# Patient Record
Sex: Female | Born: 1955
Health system: Southern US, Community
[De-identification: ages and names within clinical notes are randomized; demographics above are authoritative.]

## PROBLEM LIST (undated history)

## (undated) DIAGNOSIS — I2510 Atherosclerotic heart disease of native coronary artery without angina pectoris: Secondary | ICD-10-CM

## (undated) DIAGNOSIS — C801 Malignant (primary) neoplasm, unspecified: Secondary | ICD-10-CM

## (undated) DIAGNOSIS — R011 Cardiac murmur, unspecified: Secondary | ICD-10-CM

## (undated) DIAGNOSIS — R0600 Dyspnea, unspecified: Secondary | ICD-10-CM

## (undated) DIAGNOSIS — I219 Acute myocardial infarction, unspecified: Secondary | ICD-10-CM

## (undated) DIAGNOSIS — I350 Nonrheumatic aortic (valve) stenosis: Secondary | ICD-10-CM

## (undated) DIAGNOSIS — E119 Type 2 diabetes mellitus without complications: Secondary | ICD-10-CM

## (undated) DIAGNOSIS — F329 Major depressive disorder, single episode, unspecified: Secondary | ICD-10-CM

## (undated) DIAGNOSIS — M1990 Unspecified osteoarthritis, unspecified site: Secondary | ICD-10-CM

## (undated) DIAGNOSIS — I209 Angina pectoris, unspecified: Secondary | ICD-10-CM

## (undated) DIAGNOSIS — E785 Hyperlipidemia, unspecified: Secondary | ICD-10-CM

## (undated) DIAGNOSIS — F32A Depression, unspecified: Secondary | ICD-10-CM

## (undated) HISTORY — PX: HYSTEROSCOPY DIAGNOSTIC: PRO49

## (undated) HISTORY — PX: JOINT REPLACEMENT: SHX530

---

## 1975-09-08 HISTORY — PX: NASAL SINUS SURGERY: SHX719

## 1994-09-07 HISTORY — PX: KNEE ARTHROSCOPY: SHX127

## 1999-05-20 ENCOUNTER — Other Ambulatory Visit: Admission: RE | Admit: 1999-05-20 | Discharge: 1999-05-20 | Payer: Self-pay | Admitting: Obstetrics & Gynecology

## 2000-07-12 ENCOUNTER — Encounter: Payer: Self-pay | Admitting: Obstetrics & Gynecology

## 2000-07-12 ENCOUNTER — Ambulatory Visit (HOSPITAL_COMMUNITY): Admission: RE | Admit: 2000-07-12 | Discharge: 2000-07-12 | Payer: Self-pay | Admitting: Obstetrics & Gynecology

## 2000-07-26 ENCOUNTER — Other Ambulatory Visit: Admission: RE | Admit: 2000-07-26 | Discharge: 2000-07-26 | Payer: Self-pay | Admitting: Obstetrics & Gynecology

## 2001-05-05 ENCOUNTER — Encounter: Payer: Self-pay | Admitting: Orthopedic Surgery

## 2001-05-05 ENCOUNTER — Ambulatory Visit (HOSPITAL_COMMUNITY): Admission: RE | Admit: 2001-05-05 | Discharge: 2001-05-05 | Payer: Self-pay | Admitting: Orthopedic Surgery

## 2001-08-28 ENCOUNTER — Emergency Department (HOSPITAL_COMMUNITY): Admission: EM | Admit: 2001-08-28 | Discharge: 2001-08-28 | Payer: Self-pay | Admitting: Emergency Medicine

## 2001-08-28 ENCOUNTER — Encounter: Payer: Self-pay | Admitting: Emergency Medicine

## 2002-04-12 ENCOUNTER — Other Ambulatory Visit: Admission: RE | Admit: 2002-04-12 | Discharge: 2002-04-12 | Payer: Self-pay | Admitting: Obstetrics & Gynecology

## 2003-02-14 ENCOUNTER — Ambulatory Visit (HOSPITAL_COMMUNITY): Admission: RE | Admit: 2003-02-14 | Discharge: 2003-02-14 | Payer: Self-pay | Admitting: Obstetrics & Gynecology

## 2003-02-14 ENCOUNTER — Encounter: Payer: Self-pay | Admitting: Obstetrics & Gynecology

## 2005-01-19 ENCOUNTER — Ambulatory Visit (HOSPITAL_COMMUNITY): Admission: RE | Admit: 2005-01-19 | Discharge: 2005-01-19 | Payer: Self-pay | Admitting: Gastroenterology

## 2006-07-14 ENCOUNTER — Ambulatory Visit (HOSPITAL_COMMUNITY): Admission: RE | Admit: 2006-07-14 | Discharge: 2006-07-14 | Payer: Self-pay | Admitting: Family Medicine

## 2009-04-09 ENCOUNTER — Ambulatory Visit (HOSPITAL_COMMUNITY): Admission: RE | Admit: 2009-04-09 | Discharge: 2009-04-09 | Payer: Self-pay | Admitting: Family Medicine

## 2011-01-23 NOTE — Op Note (Signed)
Sarah Stevenson, Sarah Stevenson              ACCOUNT NO.:  [id number]   MEDICAL RECORD NO.:  [id number]          PATIENT TYPE:  AMB   LOCATION:  ENDO                         FACILITY:  [facility]   PHYSICIAN:  Anselmo Rod, M.D.  DATE OF BIRTH:  23-Aug-1956   DATE OF PROCEDURE:  DATE OF DISCHARGE:                                 OPERATIVE REPORT   PROCEDURE PERFORMED:  Screening colonoscopy.   ENDOSCOPIST:  Anselmo Rod, M.D.   INSTRUMENT USED:  Olympus video colonoscope.   INDICATIONS FOR PROCEDURE:  A 55 year old white female with a history of  rectal bleeding and change in bowel habits undergoing screening colonoscopy  to rule out colonic polyps, masses, etc.   PREPROCEDURAL PREPARATION:  Informed consent was obtained from the patient.  The patient fasted for 8 hours prior to the procedure and prepped with a  bottle of magnesium citrate and a gallon of GoLYTELY the night prior to the  procedure.  The risks and benefits of the procedure, including a 10% miss  rate of cancer and polyps, were discussed with the patient as well.   PREPROCEDURAL PHYSICAL:  The patient had stable vital signs.  Neck was  supple.  Chest was clear to auscultation.  Heart:  S1 and S2, regular.  Abdomen:  Soft with normal bowel sounds.   DESCRIPTION OF PROCEDURE:  The patient was placed in the left lateral  decubitus position and sedated with 80 mg of Demerol and 10 mg of Versed in  slow incremental doses.  Once the patient was adequately sedated and  maintained on low flow oxygen and continuous cardiac monitoring, the Olympus  video colonoscope was advanced from the rectum to the cecum.  The  appendiceal orifice and cecal valve were clearly visualized and  photographed.  No masses, polyps, erosions, ulcerations, or diverticula were  seen.  The terminal ileum appeared healthy and without lesions.  Small  internal hemorrhoids were appreciated on retroflexion in the rectum.  The  patient tolerated the procedure  well and without complications.   IMPRESSION:  Normal colonoscopy up to the terminal ileum except for small,  nonbleeding internal hemorrhoids.  No masses, polyps, or diverticula seen.   RECOMMENDATIONS:  1. Continue a high-fiber diet with liberal fluid intake.  2. Repeat colonoscopy in the next 5 years unless the patient develops any      abdominal symptoms in the interim.  3. Outpatient followup as the need arises in the future.        JNM/MEDQ  D:  01/19/2005  T:  01/19/2005  Job:  [id number]   cc:   Gloriajean Dell. Andrey Campanile, M.D.  [address]  [place]  [place] [address]  Fax: [phone]

## 2012-02-17 ENCOUNTER — Encounter: Payer: Self-pay | Admitting: Family Medicine

## 2012-02-17 ENCOUNTER — Ambulatory Visit (INDEPENDENT_AMBULATORY_CARE_PROVIDER_SITE_OTHER): Payer: 59 | Admitting: Family Medicine

## 2012-02-17 VITALS — BP 109/74 | HR 76 | Temp 98.0°F | Ht 63.0 in | Wt 195.0 lb

## 2012-02-17 DIAGNOSIS — M654 Radial styloid tenosynovitis [de Quervain]: Secondary | ICD-10-CM

## 2012-02-17 NOTE — Patient Instructions (Addendum)
You have deQuervain's tenosynovitis, a degeneration and scar tissue formation within the tendons that go into your thumb. Avoid painful activities as much as possible. Continue your celebrex - take tylenol 500mg  1-2 tabs three times a day as needed for pain as well. Wear the thumb spica brace as often as possible to rest this. Ice 15 minutes at a time 3-4 times a day A cortisone injection typically helps a great deal with this and is an option if you're not improving over the next couple weeks. You can repeat up to 2 injections but if these still do not help, surgical referral is indicated. Let me know how things are going by a couple weeks from now.

## 2012-02-18 ENCOUNTER — Encounter: Payer: Self-pay | Admitting: Family Medicine

## 2012-02-18 DIAGNOSIS — M654 Radial styloid tenosynovitis [de Quervain]: Secondary | ICD-10-CM | POA: Insufficient documentation

## 2012-02-18 NOTE — Progress Notes (Signed)
  Subjective:    Patient ID: Sarah Stevenson, female    DOB: 1956-05-13, 56 y.o.   MRN: [id number]  PCP: Benedetto Goad  HPI 56 yo F here for left thumb pain.  Patient reports yesterday she was painting with right hand while holding paint with left hand. No known injury at the time. No swelling or bruising. Then this morning started noticing pain proximal left thumb worse with motions of thumb. Has been icing. Not tried any meds yet. No prior issues with this area.  Past Medical History  Diagnosis Date  . Diabetes mellitus     No current outpatient prescriptions on file prior to visit.    Past Surgical History  Procedure Date  . Knee surgery     left arthroscopy  . Nasal sinus surgery     Allergies  Allergen Reactions  . Blue Dyes (Parenteral)   . Nsaids     History   Social History  . Marital Status: Divorced    Spouse Name: N/A    Number of Children: N/A  . Years of Education: N/A   Occupational History  . Not on file.   Social History Main Topics  . Smoking status: Never Smoker   . Smokeless tobacco: Not on file  . Alcohol Use: Not on file  . Drug Use: Not on file  . Sexually Active: Not on file   Other Topics Concern  . Not on file   Social History Narrative  . No narrative on file    Family History  Problem Relation Age of Onset  . Heart attack Mother   . Heart attack Father   . Sudden death Father   . Hypertension Neg Hx   . Hyperlipidemia Neg Hx   . Diabetes Neg Hx     BP 109/74  Pulse 76  Temp 98 F (36.7 C) (Oral)  Ht 5\' 3"  (1.6 m)  Wt 195 lb (88.451 kg)  BMI 34.54 kg/m2  Review of Systems See HPI above.    Objective:   Physical Exam Gen: NAD  L hand: No gross deformity, swelling, bruising. TTP 1st dorsal compartment near base of thumb distally.  No TTP at [facility] joint of thumb, MCP, IP joints. FROM MCP, CMC, IP joints of thumb. Collateral ligaments intact of MCP, IP joints. Able to flex and extend against resistance at  MCP, IP joints. + finkelsteins. NVI distally.    Assessment & Plan:  1. Left dequervains tenosynovitis - will start with conservative treatment, very early in course of this.  Brace with thumb immobilization as much as possible, continue celebrex, add tylenol.  Icing as needed.  Handout provided.  Consider injection if not improving as expected.

## 2012-02-18 NOTE — Assessment & Plan Note (Signed)
Left dequervains tenosynovitis - will start with conservative treatment, very early in course of this.  Brace with thumb immobilization as much as possible, continue celebrex, add tylenol.  Icing as needed.  Handout provided.  Consider injection if not improving as expected.

## 2014-03-29 ENCOUNTER — Ambulatory Visit (INDEPENDENT_AMBULATORY_CARE_PROVIDER_SITE_OTHER): Payer: Self-pay

## 2014-03-29 ENCOUNTER — Other Ambulatory Visit: Payer: Self-pay | Admitting: Adult Health

## 2014-03-29 DIAGNOSIS — M11869 Other specified crystal arthropathies, unspecified knee: Secondary | ICD-10-CM

## 2014-03-29 DIAGNOSIS — M1710 Unilateral primary osteoarthritis, unspecified knee: Secondary | ICD-10-CM

## 2014-03-29 DIAGNOSIS — M25469 Effusion, unspecified knee: Secondary | ICD-10-CM

## 2014-03-29 DIAGNOSIS — R52 Pain, unspecified: Secondary | ICD-10-CM

## 2014-04-19 ENCOUNTER — Ambulatory Visit: Payer: 59 | Attending: Family Medicine | Admitting: Rehabilitation

## 2014-04-19 DIAGNOSIS — IMO0001 Reserved for inherently not codable concepts without codable children: Secondary | ICD-10-CM | POA: Diagnosis present

## 2014-04-19 DIAGNOSIS — M25559 Pain in unspecified hip: Secondary | ICD-10-CM | POA: Insufficient documentation

## 2014-04-19 DIAGNOSIS — M25569 Pain in unspecified knee: Secondary | ICD-10-CM | POA: Diagnosis not present

## 2014-08-03 ENCOUNTER — Emergency Department (HOSPITAL_BASED_OUTPATIENT_CLINIC_OR_DEPARTMENT_OTHER)
Admission: EM | Admit: 2014-08-03 | Discharge: 2014-08-03 | Disposition: A | Discharge status: Home / Self Care | Payer: 59 | Attending: Emergency Medicine | Admitting: Emergency Medicine

## 2014-08-03 ENCOUNTER — Encounter (HOSPITAL_BASED_OUTPATIENT_CLINIC_OR_DEPARTMENT_OTHER): Payer: Self-pay | Admitting: *Deleted

## 2014-08-03 DIAGNOSIS — Z79899 Other long term (current) drug therapy: Secondary | ICD-10-CM | POA: Insufficient documentation

## 2014-08-03 DIAGNOSIS — E119 Type 2 diabetes mellitus without complications: Secondary | ICD-10-CM | POA: Insufficient documentation

## 2014-08-03 DIAGNOSIS — K029 Dental caries, unspecified: Secondary | ICD-10-CM | POA: Diagnosis not present

## 2014-08-03 DIAGNOSIS — G500 Trigeminal neuralgia: Secondary | ICD-10-CM | POA: Insufficient documentation

## 2014-08-03 DIAGNOSIS — Z791 Long term (current) use of non-steroidal anti-inflammatories (NSAID): Secondary | ICD-10-CM | POA: Diagnosis not present

## 2014-08-03 DIAGNOSIS — K088 Other specified disorders of teeth and supporting structures: Secondary | ICD-10-CM | POA: Diagnosis present

## 2014-08-03 MED ORDER — MORPHINE SULFATE 2 MG/ML IJ SOLN
2.0000 mg | Freq: Once | INTRAMUSCULAR | Status: AC
  Administered 2014-08-03: 2 mg via INTRAMUSCULAR
  Filled 2014-08-03: qty 1

## 2014-08-03 MED ORDER — CLINDAMYCIN HCL 300 MG PO CAPS: 300.0000 mg | ORAL_CAPSULE | Freq: Four times a day (QID) | ORAL | Status: DC

## 2014-08-03 MED ORDER — DIAZEPAM 2 MG PO TABS
2.0000 mg | ORAL_TABLET | Freq: Once | ORAL | Status: AC
  Administered 2014-08-03: 2 mg via ORAL
  Filled 2014-08-03: qty 1

## 2014-08-03 MED ORDER — CLINDAMYCIN HCL 150 MG PO CAPS
300.0000 mg | ORAL_CAPSULE | Freq: Once | ORAL | Status: AC
  Administered 2014-08-03: 300 mg via ORAL
  Filled 2014-08-03: qty 2

## 2014-08-03 MED ORDER — CARBAMAZEPINE 200 MG PO TABS
200.0000 mg | ORAL_TABLET | Freq: Once | ORAL | Status: AC
  Administered 2014-08-03: 200 mg via ORAL
  Filled 2014-08-03: qty 1

## 2014-08-03 MED ORDER — CARBAMAZEPINE 200 MG PO TABS: 200.0000 mg | ORAL_TABLET | Freq: Two times a day (BID) | ORAL | Status: DC

## 2014-08-03 MED ORDER — OXYCODONE-ACETAMINOPHEN 5-325 MG PO TABS
2.0000 | ORAL_TABLET | Freq: Once | ORAL | Status: DC
  Filled 2014-08-03: qty 2

## 2014-08-03 MED ORDER — OXYCODONE-ACETAMINOPHEN 5-325 MG PO TABS: 1.0000 | ORAL_TABLET | Freq: Four times a day (QID) | ORAL | Status: DC | PRN

## 2014-08-03 MED ORDER — METHOCARBAMOL 500 MG PO TABS: 500.0000 mg | ORAL_TABLET | Freq: Two times a day (BID) | ORAL | Status: DC

## 2014-08-03 NOTE — ED Notes (Addendum)
C/o tooth sensitivity and pain that started on Wednesday but worse this morning. Denies any fevers. States her dentist was not able to see her due to an illness. C/o right side upper  Filling hurting and right facial pain that she describes as constant and sharp. Pt is allergic to ibuprofen but took some anyway and premedicated with benadryl and pepcid. States she did have hives for several hours after taking ibuprofen. Last took percocet around 11pm. 1300mg  of tylenol around 11pm as well.

## 2014-08-03 NOTE — Discharge Instructions (Signed)
Dental Pain  Toothache is pain in or around a tooth. It may get worse with chewing or with cold or heat.   HOME CARE  · Your dentist may use a numbing medicine during treatment. If so, you may need to avoid eating until the medicine wears off. Ask your dentist about this.  · Only take medicine as told by your dentist or doctor.  · Avoid chewing food near the painful tooth until after all treatment is done. Ask your dentist about this.  GET HELP RIGHT AWAY IF:   · The problem gets worse or new problems appear.  · You have a fever.  · There is redness and puffiness (swelling) of the face, jaw, or neck.  · You cannot open your mouth.  · There is pain in the jaw.  · There is very bad pain that is not helped by medicine.  MAKE SURE YOU:   · Understand these instructions.  · Will watch your condition.  · Will get help right away if you are not doing well or get worse.  Document Released: 02/10/2008 Document Revised: 11/16/2011 Document Reviewed: 02/10/2008  ExitCare® Patient Information ©2015 [employer]. This information is not intended to replace advice given to you by your health care provider. Make sure you discuss any questions you have with your health care provider.

## 2014-08-03 NOTE — ED Provider Notes (Signed)
CSN: [id number]     Arrival date & time 08/03/14  0217 History   First MD Initiated Contact with Patient 08/03/14 0231     Chief Complaint  Patient presents with  . Dental Pain     (Consider location/radiation/quality/duration/timing/severity/associated sxs/prior Treatment) Patient is a 58 y.o. female presenting with tooth pain. The history is provided by the patient. No language interpreter was used.  Dental Pain Location:  Upper Upper teeth location:  2/RU 2nd molar Quality:  Radiating (has spasmotic pain and sharp pain all the way to above the right eye) Severity:  Severe Onset quality:  Gradual Timing:  Constant Progression:  Worsening Chronicity:  New Context: cap still on, not dental fracture, not recent dental surgery and not trauma   Previous work-up:  Dental exam and filled cavity Relieved by:  Nothing Worsened by:  Nothing tried Ineffective treatments:  Acetaminophen and NSAIDs (percocet) Associated symptoms: facial pain   Associated symptoms: no congestion, no drooling, no facial swelling, no fever, no neck swelling, no oral bleeding and no trismus   Risk factors: diabetes   Was unable to see dentist today.  Has ground teeth in the past.  Feels spasm in muscles on right side but no click.    Past Medical History  Diagnosis Date  . Diabetes mellitus    Past Surgical History  Procedure Laterality Date  . Knee surgery      left arthroscopy  . Nasal sinus surgery     Family History  Problem Relation Age of Onset  . Heart attack Mother   . Heart attack Father   . Sudden death Father   . Hypertension Neg Hx   . Hyperlipidemia Neg Hx   . Diabetes Neg Hx    History  Substance Use Topics  . Smoking status: Former Research scientist (life sciences)  . Smokeless tobacco: Not on file  . Alcohol Use: No   OB History    No data available     Review of Systems  Constitutional: Negative for fever.  HENT: Negative for congestion, drooling, ear pain and facial swelling.   All other  systems reviewed and are negative.     Allergies  Blue dyes (parenteral) and Nsaids  Home Medications   Prior to Admission medications   Medication Sig Start Date End Date Taking? Authorizing Provider  Acetaminophen (TYLENOL ARTHRITIS PAIN PO) Take 650 mg by mouth.   Yes Historical Provider, MD  ALPRAZolam Duanne Moron) 0.5 MG tablet Take 0.5 mg by mouth at bedtime as needed for anxiety.   Yes Historical Provider, MD  atorvastatin (LIPITOR) 10 MG tablet Take 10 mg by mouth daily.   Yes Historical Provider, MD  celecoxib (CELEBREX) 200 MG capsule Take 200 mg by mouth 2 (two) times daily.   Yes Historical Provider, MD  cetirizine (ZYRTEC) 10 MG tablet Take 10 mg by mouth daily.   Yes Historical Provider, MD  glipiZIDE (GLUCOTROL) 5 MG tablet Take 5 mg by mouth daily before breakfast.   Yes Historical Provider, MD  losartan (COZAAR) 25 MG tablet Take 10 mg by mouth daily.   Yes Historical Provider, MD  METFORMIN HCL PO Take 1,500 mg by mouth.   Yes Historical Provider, MD  Multiple Vitamin (MULTIVITAMIN) capsule Take 1 capsule by mouth daily.   Yes Historical Provider, MD  Ranitidine HCl (ZANTAC PO) Take 150 mg by mouth.   Yes Historical Provider, MD  sitaGLIPtin (JANUVIA) 100 MG tablet Take 100 mg by mouth daily.   Yes Historical Provider, MD  carbamazepine (  TEGRETOL) 200 MG tablet Take 1 tablet (200 mg total) by mouth 2 (two) times daily. 08/03/14   Sheana Bir K Joh Rao-Rasch, MD  clindamycin (CLEOCIN) 300 MG capsule Take 1 capsule (300 mg total) by mouth 4 (four) times daily. X 7 days 08/03/14   Errica Dutil K Jeanclaude Wentworth-Rasch, MD  methocarbamol (ROBAXIN) 500 MG tablet Take 1 tablet (500 mg total) by mouth 2 (two) times daily. 08/03/14   Cresencia Asmus K Keaton Stirewalt-Rasch, MD  oxyCODONE-acetaminophen (PERCOCET) 5-325 MG per tablet Take 1-2 tablets by mouth every 6 (six) hours as needed. 08/03/14   Jya Hughston K Alzada Brazee-Rasch, MD   BP 188/97 mmHg  Pulse 94  Temp(Src) 97.5 F (36.4 C) (Oral)  Resp 18  SpO2 97% Physical Exam   Constitutional: She is oriented to person, place, and time. She appears well-developed and well-nourished.  HENT:  Head: Normocephalic and atraumatic.  Right Ear: External ear normal.  Left Ear: External ear normal.  Mouth/Throat: Oropharynx is clear and moist. No trismus in the jaw. No uvula swelling. No oropharyngeal exudate.    Eyes: Conjunctivae are normal. Pupils are equal, round, and reactive to light.  Neck: Normal range of motion. Neck supple.  Cardiovascular: Normal rate and regular rhythm.   Pulmonary/Chest: Effort normal and breath sounds normal. No respiratory distress. She has no wheezes. She has no rales.  Abdominal: Soft. Bowel sounds are normal. There is no tenderness.  Musculoskeletal: Normal range of motion.  Lymphadenopathy:    She has no cervical adenopathy.  Neurological: She is alert and oriented to person, place, and time.  Skin: Skin is warm and dry. She is not diaphoretic.    ED Course  Procedures (including critical care time) Labs Review Labs Reviewed - No data to display  Imaging Review No results found.   EKG Interpretation None      MDM   Final diagnoses:  Dental caries  Trigeminal neuralgia of right side of face   Medications  clindamycin (CLEOCIN) capsule 300 mg (300 mg Oral Given 08/03/14 0254)  morphine 2 MG/ML injection 2 mg (2 mg Intramuscular Given 08/03/14 0255)  diazepam (VALIUM) tablet 2 mg (2 mg Oral Given 08/03/14 0255)  carbamazepine (TEGRETOL) tablet 200 mg (200 mg Oral Given 08/03/14 0344)   While there is definitely a cavity in the right upper first molar, the pain is more consistent with trigeminal neuralgia.  Will treat for both caries and trigeminal neuralgia.  Follow up with dentistry or oral surgery    Manuel Dall K Quantina Dershem-Rasch, MD 08/03/14 [phone]

## 2014-08-03 NOTE — ED Notes (Signed)
Pt alert, NAD, calm, interactive, resps e/u, speaking in clear sentences, guarded movements and positioning of mouth, gauze with topical anesthetic held in mouth, visitor to BS.

## 2014-09-07 DIAGNOSIS — I219 Acute myocardial infarction, unspecified: Secondary | ICD-10-CM

## 2014-09-07 HISTORY — DX: Acute myocardial infarction, unspecified: I21.9

## 2014-11-30 ENCOUNTER — Emergency Department (HOSPITAL_BASED_OUTPATIENT_CLINIC_OR_DEPARTMENT_OTHER): Payer: 59

## 2014-11-30 ENCOUNTER — Encounter (HOSPITAL_COMMUNITY)
Admission: EM | Disposition: A | Discharge status: Home / Self Care | Payer: 59 | Source: Home / Self Care | Attending: Internal Medicine

## 2014-11-30 ENCOUNTER — Encounter (HOSPITAL_BASED_OUTPATIENT_CLINIC_OR_DEPARTMENT_OTHER): Payer: Self-pay

## 2014-11-30 ENCOUNTER — Inpatient Hospital Stay (HOSPITAL_BASED_OUTPATIENT_CLINIC_OR_DEPARTMENT_OTHER)
Admission: EM | Admit: 2014-11-30 | Discharge: 2014-12-01 | DRG: 247 | Disposition: A | Discharge status: Home / Self Care | Payer: 59 | Attending: Internal Medicine | Admitting: Internal Medicine

## 2014-11-30 DIAGNOSIS — M1990 Unspecified osteoarthritis, unspecified site: Secondary | ICD-10-CM | POA: Diagnosis present

## 2014-11-30 DIAGNOSIS — I200 Unstable angina: Secondary | ICD-10-CM | POA: Diagnosis not present

## 2014-11-30 DIAGNOSIS — I25110 Atherosclerotic heart disease of native coronary artery with unstable angina pectoris: Secondary | ICD-10-CM | POA: Diagnosis not present

## 2014-11-30 DIAGNOSIS — I214 Non-ST elevation (NSTEMI) myocardial infarction: Principal | ICD-10-CM | POA: Diagnosis present

## 2014-11-30 DIAGNOSIS — Z87891 Personal history of nicotine dependence: Secondary | ICD-10-CM

## 2014-11-30 DIAGNOSIS — I700 Atherosclerosis of aorta: Secondary | ICD-10-CM | POA: Diagnosis present

## 2014-11-30 DIAGNOSIS — E785 Hyperlipidemia, unspecified: Secondary | ICD-10-CM | POA: Diagnosis present

## 2014-11-30 DIAGNOSIS — E119 Type 2 diabetes mellitus without complications: Secondary | ICD-10-CM | POA: Diagnosis not present

## 2014-11-30 DIAGNOSIS — E669 Obesity, unspecified: Secondary | ICD-10-CM | POA: Diagnosis present

## 2014-11-30 DIAGNOSIS — R079 Chest pain, unspecified: Secondary | ICD-10-CM | POA: Diagnosis present

## 2014-11-30 DIAGNOSIS — R011 Cardiac murmur, unspecified: Secondary | ICD-10-CM | POA: Diagnosis present

## 2014-11-30 DIAGNOSIS — I350 Nonrheumatic aortic (valve) stenosis: Secondary | ICD-10-CM | POA: Diagnosis not present

## 2014-11-30 HISTORY — DX: Cardiac murmur, unspecified: R01.1

## 2014-11-30 HISTORY — DX: Unspecified osteoarthritis, unspecified site: M19.90

## 2014-11-30 HISTORY — PX: CARDIAC CATHETERIZATION: SHX172

## 2014-11-30 HISTORY — DX: Hyperlipidemia, unspecified: E78.5

## 2014-11-30 LAB — TROPONIN I
TROPONIN I: 0.05 ng/mL — AB (ref <0.031)
Troponin I: 0.04 ng/mL — ABNORMAL HIGH (ref <0.031)
Troponin I: 0.06 ng/mL — ABNORMAL HIGH (ref <0.031)
Troponin I: 0.08 ng/mL — ABNORMAL HIGH (ref <0.031)

## 2014-11-30 LAB — POCT I-STAT 3, VENOUS BLOOD GAS (G3P V)
Acid-base deficit: 4 mmol/L — ABNORMAL HIGH (ref 0.0–2.0)
Bicarbonate: 23.2 mEq/L (ref 20.0–24.0)
O2 SAT: 65 %
PCO2 VEN: 47.7 mmHg (ref 45.0–50.0)
PH VEN: 7.296 (ref 7.250–7.300)
TCO2: 25 mmol/L (ref 0–100)
pO2, Ven: 37 mmHg (ref 30.0–45.0)

## 2014-11-30 LAB — CBC
HCT: 38.9 % (ref 36.0–46.0)
HEMOGLOBIN: 13.2 g/dL (ref 12.0–15.0)
MCH: 31.3 pg (ref 26.0–34.0)
MCHC: 33.9 g/dL (ref 30.0–36.0)
MCV: 92.2 fL (ref 78.0–100.0)
Platelets: 225 10*3/uL (ref 150–400)
RBC: 4.22 MIL/uL (ref 3.87–5.11)
RDW: 12.2 % (ref 11.5–15.5)
WBC: 8.3 10*3/uL (ref 4.0–10.5)

## 2014-11-30 LAB — BASIC METABOLIC PANEL
ANION GAP: 11 (ref 5–15)
BUN: 12 mg/dL (ref 6–23)
CHLORIDE: 103 mmol/L (ref 96–112)
CO2: 24 mmol/L (ref 19–32)
Calcium: 9.2 mg/dL (ref 8.4–10.5)
Creatinine, Ser: 0.96 mg/dL (ref 0.50–1.10)
GFR calc Af Amer: 74 mL/min — ABNORMAL LOW (ref >90)
GFR calc non Af Amer: 64 mL/min — ABNORMAL LOW (ref >90)
Glucose, Bld: 247 mg/dL — ABNORMAL HIGH (ref 70–99)
Potassium: 4.3 mmol/L (ref 3.5–5.1)
SODIUM: 138 mmol/L (ref 135–145)

## 2014-11-30 LAB — GLUCOSE, CAPILLARY
Glucose-Capillary: 243 mg/dL — ABNORMAL HIGH (ref 70–99)
Glucose-Capillary: 155 mg/dL — ABNORMAL HIGH (ref 70–99)
Glucose-Capillary: 141 mg/dL — ABNORMAL HIGH (ref 70–99)

## 2014-11-30 LAB — POCT I-STAT 3, ART BLOOD GAS (G3+)
ACID-BASE DEFICIT: 4 mmol/L — AB (ref 0.0–2.0)
Bicarbonate: 22.1 mEq/L (ref 20.0–24.0)
O2 SAT: 93 %
PO2 ART: 72 mmHg — AB (ref 80.0–100.0)
TCO2: 23 mmol/L (ref 0–100)
pCO2 arterial: 41.7 mmHg (ref 35.0–45.0)
pH, Arterial: 7.332 — ABNORMAL LOW (ref 7.350–7.450)

## 2014-11-30 LAB — POCT ACTIVATED CLOTTING TIME: Activated Clotting Time: 503 seconds

## 2014-11-30 SURGERY — RIGHT/LEFT HEART CATH AND CORONARY ANGIOGRAPHY
Laterality: Right

## 2014-11-30 MED ORDER — NITROGLYCERIN 1 MG/10 ML FOR IR/CATH LAB
INTRA_ARTERIAL | Status: AC
  Filled 2014-11-30: qty 10

## 2014-11-30 MED ORDER — ONDANSETRON HCL 4 MG/2ML IJ SOLN
4.0000 mg | Freq: Once | INTRAMUSCULAR | Status: AC
  Administered 2014-11-30: 4 mg via INTRAVENOUS

## 2014-11-30 MED ORDER — DIPHENHYDRAMINE HCL 50 MG/ML IJ SOLN
12.5000 mg | Freq: Once | INTRAMUSCULAR | Status: AC
  Administered 2014-11-30: 12.5 mg via INTRAVENOUS
  Filled 2014-11-30: qty 1

## 2014-11-30 MED ORDER — TICAGRELOR 90 MG PO TABS
90.0000 mg | ORAL_TABLET | Freq: Two times a day (BID) | ORAL | Status: DC
  Administered 2014-12-01: 05:00:00 90 mg via ORAL
  Filled 2014-11-30 (×3): qty 1

## 2014-11-30 MED ORDER — MIDAZOLAM HCL 2 MG/2ML IJ SOLN
INTRAMUSCULAR | Status: AC
Start: 2014-11-30 — End: 2014-11-30
  Filled 2014-11-30: qty 2

## 2014-11-30 MED ORDER — LINAGLIPTIN 5 MG PO TABS
5.0000 mg | ORAL_TABLET | Freq: Every day | ORAL | Status: DC
  Filled 2014-11-30 (×2): qty 1

## 2014-11-30 MED ORDER — NITROGLYCERIN 0.4 MG SL SUBL
0.4000 mg | SUBLINGUAL_TABLET | SUBLINGUAL | Status: DC | PRN
  Administered 2014-11-30 (×3): 0.4 mg via SUBLINGUAL
  Filled 2014-11-30: qty 1

## 2014-11-30 MED ORDER — KRILL OIL 300 MG PO CAPS: 300.0000 mg | ORAL_CAPSULE | Freq: Every morning | ORAL | Status: DC

## 2014-11-30 MED ORDER — MULTIVITAMINS PO CAPS: 1.0000 | ORAL_CAPSULE | Freq: Every day | ORAL | Status: DC

## 2014-11-30 MED ORDER — GLIPIZIDE 10 MG PO TABS
10.0000 mg | ORAL_TABLET | Freq: Every day | ORAL | Status: DC
  Administered 2014-12-01: 10 mg via ORAL
  Filled 2014-11-30 (×2): qty 1

## 2014-11-30 MED ORDER — HEPARIN SODIUM (PORCINE) 1000 UNIT/ML IJ SOLN
INTRAMUSCULAR | Status: AC
Start: 2014-11-30 — End: 2014-11-30
  Filled 2014-11-30: qty 1

## 2014-11-30 MED ORDER — ASPIRIN 81 MG PO CHEW: 81.0000 mg | CHEWABLE_TABLET | ORAL | Status: DC

## 2014-11-30 MED ORDER — GI COCKTAIL ~~LOC~~
ORAL | Status: AC
  Filled 2014-11-30: qty 30

## 2014-11-30 MED ORDER — ACETAMINOPHEN 325 MG PO TABS
650.0000 mg | ORAL_TABLET | Freq: Three times a day (TID) | ORAL | Status: DC
  Filled 2014-11-30 (×3): qty 2

## 2014-11-30 MED ORDER — ONDANSETRON HCL 4 MG/2ML IJ SOLN
INTRAMUSCULAR | Status: AC
  Administered 2014-11-30: 4 mg via INTRAVENOUS
  Filled 2014-11-30: qty 2

## 2014-11-30 MED ORDER — LIDOCAINE HCL (PF) 1 % IJ SOLN
INTRAMUSCULAR | Status: AC
  Filled 2014-11-30: qty 30

## 2014-11-30 MED ORDER — ATORVASTATIN CALCIUM 80 MG PO TABS
80.0000 mg | ORAL_TABLET | Freq: Every day | ORAL | Status: DC
  Administered 2014-11-30: 80 mg via ORAL
  Filled 2014-11-30 (×2): qty 1

## 2014-11-30 MED ORDER — SODIUM CHLORIDE 0.9 % IV SOLN: INTRAVENOUS | Status: DC

## 2014-11-30 MED ORDER — SODIUM CHLORIDE 0.9 % IJ SOLN: 3.0000 mL | Freq: Two times a day (BID) | INTRAMUSCULAR | Status: DC

## 2014-11-30 MED ORDER — MIDAZOLAM HCL 2 MG/2ML IJ SOLN
INTRAMUSCULAR | Status: AC
  Filled 2014-11-30: qty 2

## 2014-11-30 MED ORDER — TICAGRELOR 90 MG PO TABS
ORAL_TABLET | ORAL | Status: AC
Start: 2014-11-30 — End: 2014-11-30
  Filled 2014-11-30: qty 1

## 2014-11-30 MED ORDER — LOSARTAN POTASSIUM 25 MG PO TABS
25.0000 mg | ORAL_TABLET | Freq: Every day | ORAL | Status: DC
  Administered 2014-12-01: 25 mg via ORAL
  Filled 2014-11-30 (×2): qty 1

## 2014-11-30 MED ORDER — ONDANSETRON HCL 4 MG/2ML IJ SOLN: 4.0000 mg | Freq: Four times a day (QID) | INTRAMUSCULAR | Status: DC | PRN

## 2014-11-30 MED ORDER — FAMOTIDINE 20 MG PO TABS
20.0000 mg | ORAL_TABLET | Freq: Every day | ORAL | Status: DC
  Administered 2014-11-30: 20 mg via ORAL
  Filled 2014-11-30 (×2): qty 1

## 2014-11-30 MED ORDER — NITROGLYCERIN IN D5W 200-5 MCG/ML-% IV SOLN
0.0000 ug/min | INTRAVENOUS | Status: DC
  Administered 2014-11-30: 10 ug/min via INTRAVENOUS
  Filled 2014-11-30: qty 250

## 2014-11-30 MED ORDER — PNEUMOCOCCAL VAC POLYVALENT 25 MCG/0.5ML IJ INJ
0.5000 mL | INJECTION | INTRAMUSCULAR | Status: AC
Start: 2014-12-01 — End: 2014-12-01
  Administered 2014-12-01: 09:00:00 0.5 mL via INTRAMUSCULAR
  Filled 2014-11-30: qty 0.5

## 2014-11-30 MED ORDER — ADULT MULTIVITAMIN W/MINERALS CH
1.0000 | ORAL_TABLET | Freq: Every day | ORAL | Status: DC
  Filled 2014-11-30: qty 1

## 2014-11-30 MED ORDER — OXYCODONE-ACETAMINOPHEN 5-325 MG PO TABS: 1.0000 | ORAL_TABLET | ORAL | Status: DC | PRN

## 2014-11-30 MED ORDER — SODIUM CHLORIDE 0.9 % IV SOLN: INTRAVENOUS | Status: AC

## 2014-11-30 MED ORDER — SODIUM CHLORIDE 0.9 % IV SOLN: 250.0000 mL | INTRAVENOUS | Status: DC | PRN

## 2014-11-30 MED ORDER — SODIUM CHLORIDE 0.9 % IV SOLN: Freq: Once | INTRAVENOUS | Status: DC

## 2014-11-30 MED ORDER — MORPHINE SULFATE 2 MG/ML IJ SOLN: 2.0000 mg | INTRAMUSCULAR | Status: DC | PRN

## 2014-11-30 MED ORDER — ACETAMINOPHEN 325 MG PO TABS: 650.0000 mg | ORAL_TABLET | ORAL | Status: DC | PRN

## 2014-11-30 MED ORDER — HEPARIN BOLUS VIA INFUSION
4000.0000 [IU] | Freq: Once | INTRAVENOUS | Status: AC
  Administered 2014-11-30: 4000 [IU] via INTRAVENOUS

## 2014-11-30 MED ORDER — ANGIOPLASTY BOOK
Freq: Once | Status: AC
  Administered 2014-11-30: 19:00:00
  Filled 2014-11-30: qty 1

## 2014-11-30 MED ORDER — MORPHINE SULFATE 4 MG/ML IJ SOLN
INTRAMUSCULAR | Status: AC
  Filled 2014-11-30: qty 1

## 2014-11-30 MED ORDER — FENTANYL CITRATE 0.05 MG/ML IJ SOLN
INTRAMUSCULAR | Status: AC
  Filled 2014-11-30: qty 2

## 2014-11-30 MED ORDER — HEPARIN (PORCINE) IN NACL 100-0.45 UNIT/ML-% IJ SOLN
1000.0000 [IU]/h | INTRAMUSCULAR | Status: DC
  Administered 2014-11-30: 1000 [IU]/h via INTRAVENOUS
  Filled 2014-11-30: qty 250

## 2014-11-30 MED ORDER — ASPIRIN 81 MG PO CHEW
81.0000 mg | CHEWABLE_TABLET | Freq: Every day | ORAL | Status: DC
  Filled 2014-11-30: qty 1

## 2014-11-30 MED ORDER — ASPIRIN 81 MG PO CHEW
324.0000 mg | CHEWABLE_TABLET | Freq: Once | ORAL | Status: AC
  Administered 2014-11-30: 324 mg via ORAL
  Filled 2014-11-30: qty 4

## 2014-11-30 MED ORDER — SODIUM CHLORIDE 0.9 % IJ SOLN: 3.0000 mL | INTRAMUSCULAR | Status: DC | PRN

## 2014-11-30 MED ORDER — MORPHINE SULFATE 4 MG/ML IJ SOLN
4.0000 mg | Freq: Once | INTRAMUSCULAR | Status: AC
  Administered 2014-11-30: 4 mg via INTRAVENOUS

## 2014-11-30 MED ORDER — TICAGRELOR 90 MG PO TABS
ORAL_TABLET | ORAL | Status: AC
  Filled 2014-11-30: qty 1

## 2014-11-30 MED ORDER — ALPRAZOLAM 0.5 MG PO TABS: 0.5000 mg | ORAL_TABLET | Freq: Every evening | ORAL | Status: DC | PRN

## 2014-11-30 MED ORDER — HEPARIN (PORCINE) IN NACL 2-0.9 UNIT/ML-% IJ SOLN
INTRAMUSCULAR | Status: AC
  Filled 2014-11-30: qty 1500

## 2014-11-30 MED ORDER — VERAPAMIL HCL 2.5 MG/ML IV SOLN
INTRAVENOUS | Status: AC
  Filled 2014-11-30: qty 2

## 2014-11-30 MED ORDER — LORATADINE 10 MG PO TABS
10.0000 mg | ORAL_TABLET | Freq: Every day | ORAL | Status: DC
  Filled 2014-11-30 (×2): qty 1

## 2014-11-30 MED ORDER — METFORMIN HCL 500 MG PO TABS
1500.0000 mg | ORAL_TABLET | Freq: Every day | ORAL | Status: DC
  Filled 2014-11-30 (×2): qty 3

## 2014-11-30 MED ORDER — GI COCKTAIL ~~LOC~~
30.0000 mL | Freq: Once | ORAL | Status: AC
  Administered 2014-11-30: 30 mL via ORAL

## 2014-11-30 MED ORDER — BIVALIRUDIN 250 MG IV SOLR
INTRAVENOUS | Status: AC
  Filled 2014-11-30: qty 250

## 2014-11-30 NOTE — ED Notes (Signed)
Gerealized urticarial rash. Pt states she has had a similar reaction to NSAIDS.

## 2014-11-30 NOTE — ED Notes (Signed)
EDP informed of pt status.

## 2014-11-30 NOTE — ED Notes (Signed)
Pt transported to Cath Lab via [facility] and stable upon transfer.  Report to be called to Cath Lab staff

## 2014-11-30 NOTE — Progress Notes (Signed)
TR BAND REMOVAL  LOCATION:    right radial  DEFLATED PER PROTOCOL:    Yes.    TIME BAND OFF / DRESSING APPLIED:    1915   SITE UPON ARRIVAL:    Level 0  SITE AFTER BAND REMOVAL:    Level 0  REVERSE ALLEN'S TEST:     positive  CIRCULATION SENSATION AND MOVEMENT:    Within Normal Limits   Yes.    COMMENTS:

## 2014-11-30 NOTE — Progress Notes (Signed)
PHARMACIST - PHYSICIAN ORDER COMMUNICATION  CONCERNING: P&T Medication Policy on Herbal Medications  DESCRIPTION:  This patient's order for:  Sarah Stevenson  has been noted.  This product(s) is classified as an "herbal" or natural product. Due to a lack of definitive safety studies or FDA approval, nonstandard manufacturing practices, plus the potential risk of unknown drug-drug interactions while on inpatient medications, the Pharmacy and Therapeutics Committee does not permit the use of "herbal" or natural products of this type within [facility].   ACTION TAKEN: The pharmacy department is unable to verify this order at this time and your patient has been informed of this safety policy. Please reevaluate patient's clinical condition at discharge and address if the herbal or natural product(s) should be resumed at that time.  Heide Guile, PharmD, BCPS Clinical Pharmacist Pager [phone]

## 2014-11-30 NOTE — Progress Notes (Signed)
Rt brachial venous sheath removed, 10 min pressure hold applied , dsd applied. Brachial pulse palpable, tolerated procedure well. Site level 0

## 2014-11-30 NOTE — ED Notes (Signed)
Report called to Cath lab.

## 2014-11-30 NOTE — ED Notes (Signed)
[facility] at bedside preparing to transport.

## 2014-11-30 NOTE — ED Notes (Signed)
Intermittent midsternal chest pressure that started 1 week ago associated with exertional SHOB and radiation to left arm and left shoulder/arm (aching).  Has been taking Fish Oil, Krill Oil, and Red Yeast Rice recently.  States if she could belch she would feel better.  Took a liquid antacid a few days ago with relief.

## 2014-11-30 NOTE — ED Provider Notes (Addendum)
CSN: [id number]     Arrival date & time 11/30/14  0741 History   First MD Initiated Contact with Patient 11/30/14 [phone]     Chief Complaint  Patient presents with  . Chest Pain     (Consider location/radiation/quality/duration/timing/severity/associated sxs/prior Treatment) HPI Comments: Patient with a history of diabetes and hyperlipidemia presents with chest discomfort. She reports a one-week history of intermittent indigestion symptoms. She's had some waxing and waning symptoms of discomfort to the center of her chest. It has relieved with antacids. She does have an exertional component to the symptoms however. She's had some occasional shortness of breath with the symptoms. At time she's had some radiation to her jaw and her left arm. She also had an episode yesterday with the symptoms and they became more severe and she got diaphoretic and had associated shortness of breath. This morning she states that he indigestion started about 5:00 in his been constant since that time. She does have some associated shortness of breath. She has some radiation to the left arm. She has a family history of early heart disease in that her mom in her dad both had coronary artery disease in their 85s and early 52s.  Patient is a 59 y.o. female presenting with chest pain.  Chest Pain Associated symptoms: diaphoresis, fatigue and shortness of breath   Associated symptoms: no abdominal pain, no cough, no dizziness, no fever, no headache, no nausea, no numbness, not vomiting and no weakness     Past Medical History  Diagnosis Date  . Diabetes mellitus   . Osteoarthritis   . Hyperlipidemia    Past Surgical History  Procedure Laterality Date  . Knee surgery      left arthroscopy  . Nasal sinus surgery     Family History  Problem Relation Age of Onset  . Heart attack Mother   . Heart attack Father   . Sudden death Father   . Hypertension Neg Hx   . Hyperlipidemia Neg Hx   . Diabetes Neg Hx     History  Substance Use Topics  . Smoking status: Former Research scientist (life sciences)  . Smokeless tobacco: Not on file  . Alcohol Use: No   OB History    No data available     Review of Systems  Constitutional: Positive for diaphoresis and fatigue. Negative for fever and chills.  HENT: Negative for congestion, rhinorrhea and sneezing.   Eyes: Negative.   Respiratory: Positive for shortness of breath. Negative for cough and chest tightness.   Cardiovascular: Positive for chest pain. Negative for leg swelling.  Gastrointestinal: Negative for nausea, vomiting, abdominal pain, diarrhea and blood in stool.  Genitourinary: Negative for frequency, hematuria, flank pain and difficulty urinating.  Musculoskeletal: Negative.   Skin: Negative for rash.  Neurological: Negative for dizziness, speech difficulty, weakness, numbness and headaches.      Allergies  Blue dyes (parenteral) and Nsaids  Home Medications   Prior to Admission medications   Medication Sig Start Date End Date Taking? Authorizing Provider  KRILL OIL PO Take by mouth.   Yes Historical Provider, MD  Acetaminophen (TYLENOL ARTHRITIS PAIN PO) Take 650 mg by mouth.    Historical Provider, MD  ALPRAZolam Duanne Moron) 0.5 MG tablet Take 0.5 mg by mouth at bedtime as needed for anxiety.    Historical Provider, MD  atorvastatin (LIPITOR) 10 MG tablet Take 40 mg by mouth daily.     Historical Provider, MD  celecoxib (CELEBREX) 200 MG capsule Take 200 mg by mouth daily.  Historical Provider, MD  cetirizine (ZYRTEC) 10 MG tablet Take 10 mg by mouth daily.    Historical Provider, MD  glipiZIDE (GLUCOTROL) 5 MG tablet Take 10 mg by mouth daily before breakfast.     Historical Provider, MD  losartan (COZAAR) 25 MG tablet Take 25 mg by mouth daily.     Historical Provider, MD  METFORMIN HCL PO Take 1,500 mg by mouth.    Historical Provider, MD  Multiple Vitamin (MULTIVITAMIN) capsule Take 1 capsule by mouth daily.    Historical Provider, MD  Ranitidine  HCl (ZANTAC PO) Take 150 mg by mouth.    Historical Provider, MD  sitaGLIPtin (JANUVIA) 100 MG tablet Take 100 mg by mouth daily.    Historical Provider, MD   BP 116/77 mmHg  Pulse 82  Temp(Src) 98.8 F (37.1 C) (Oral)  Resp 18  Ht 5\' 2"  (1.575 m)  Wt 195 lb (88.451 kg)  BMI 35.66 kg/m2  SpO2 97% Physical Exam  Constitutional: She is oriented to person, place, and time. She appears well-developed and well-nourished.  HENT:  Head: Normocephalic and atraumatic.  Eyes: Pupils are equal, round, and reactive to light.  Neck: Normal range of motion. Neck supple.  Cardiovascular: Normal rate, regular rhythm and normal heart sounds.   Pulmonary/Chest: Effort normal and breath sounds normal. No respiratory distress. She has no wheezes. She has no rales. She exhibits no tenderness.  Abdominal: Soft. Bowel sounds are normal. There is no tenderness. There is no rebound and no guarding.  Musculoskeletal: Normal range of motion. She exhibits no edema.  Lymphadenopathy:    She has no cervical adenopathy.  Neurological: She is alert and oriented to person, place, and time.  Skin: Skin is warm and dry. No rash noted.  Psychiatric: She has a normal mood and affect.    ED Course  Procedures (including critical care time) Labs Review Labs Reviewed  BASIC METABOLIC PANEL - Abnormal; Notable for the following:    Glucose, Bld 247 (*)    GFR calc non Af Amer 64 (*)    GFR calc Af Amer 74 (*)    All other components within normal limits  TROPONIN I - Abnormal; Notable for the following:    Troponin I 0.04 (*)    All other components within normal limits  CBC    Imaging Review Dg Chest Port 1 View  11/30/2014   CLINICAL DATA:  Central chest pressure with shortness of Breath  EXAM: PORTABLE CHEST - 1 VIEW  COMPARISON:  None.  FINDINGS: The heart size and mediastinal contours are within normal limits. Both lungs are clear. The visualized skeletal structures are unremarkable.  IMPRESSION: No active  disease.   Electronically Signed   By: Inez Catalina M.D.   On: 11/30/2014 08:06     EKG Interpretation   Date/Time:  Friday November 30 2014 08:10:22 EDT Ventricular Rate:  76 PR Interval:  134 QRS Duration: 90 QT Interval:  384 QTC Calculation: 432 R Axis:   18 Text Interpretation:  Normal sinus rhythm Normal ECG No old tracing to  compare Confirmed by Corley Kohls  MD, Kepler Mccabe ([id number]) on 11/30/2014 8:12:42 AM      MDM   Final diagnoses:  Unstable angina    Patient presents with some mid chest discomfort which she describes as indigestion and has some improvement with an acid type medicines but she also has an exertional component. She has a normal-appearing EKG but a mild bump in her troponin. Even here in the  ED her symptoms worsen with minimal exertion. She has an allergy to NSAIDs but she does take Celebrex.  She was given aspirin in the ED but did develop a rash. She was given a dose of Benadryl following this. She was started on heparin for unstable angina. I spoke with Dr. Haroldine Laws with the cardiology service who is accepted the patient for transfer and will arrange for her to have a cardiac catheterization later this afternoon.  Pt is currently pain free.    Malvin Johns, MD 11/30/14 7169  Malvin Johns, MD 11/30/14 [phone]

## 2014-11-30 NOTE — Progress Notes (Signed)
Pt in to for cath procedure. via stretcher.

## 2014-11-30 NOTE — H&P (Signed)
Sarah Stevenson is a 59 y.o. female  Admit Date: 11/30/2014 Referring Physician: [facility] Primary Cardiologist:: [facility] Blenda Bridegroom, M.D. 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 These episodes of pressure on both exertional and nonexertional. The longest episode lasted greater than 60 minutes this past weekend. She had another prolonged episode today while at work. She was evaluated by the emergency room staff, the ED physician discussed it with Dr. Jeffie Pollock, and she was sent to [facility] to have further evaluation which would include coronary angiography.  During the interview she was having recurring episodes of discomfort.  She tells me that her primary physician has identified a heart murmur on exam over the past 3 years. She has no history of valvular heart disease.  She was a prior smoker for greater than 30 years. No cigarettes for the past 2 years. She has been under a lot of stress with school and home problems.    PMH:    Past Medical History  Diagnosis Date  . Diabetes mellitus   . Osteoarthritis   . Hyperlipidemia     PSH:    Past Surgical History  Procedure Laterality Date  . Knee surgery      left arthroscopy  . Nasal sinus surgery     ALLERGIES:   Asa; Blue dyes (parenteral); and Nsaids Prior to Admit Meds:   (Not in a hospital admission) Family HX:    Family History  Problem Relation Age of Onset  . Heart attack Mother   . Heart attack Father   . Sudden death Father   . Hypertension Neg Hx   . Hyperlipidemia Neg Hx   . Diabetes Neg Hx    Social HX:    History   Social History  . Marital Status: Divorced    Spouse Name: N/A  . Number of Children: N/A  . Years of Education: N/A   Occupational History  . Not on  file.   Social History Main Topics  . Smoking status: Former Research scientist (life sciences)  . Smokeless tobacco: Not on file  . Alcohol Use: No  . Drug Use: No  . Sexual Activity: Not on file   Other Topics Concern  . Not on file   Social History Narrative     ROS: No history of myocardial infarction. Denies orthopnea. Is no peripheral edema. No history of advanced. No allergies.  Physical Exam: Blood pressure 126/68, pulse 110, temperature 98.8 F (37.1 C), temperature source Oral, resp. rate 18, height 5\' 2"  (1.575 m), weight 195 lb (88.451 kg), SpO2 97 %.     Somewhat pale. Obese, in no acute distress HEENT exam reveals no jaundice. Extraocular movements are full. Neck exam reveals bilateral carotid upstroke to be slightly diminished. Transmitted bruits are heard. No JVD is noted. Chest is clear to auscult aged and percussion Cardiac exam reveals 3/6 crescendo decrescendo murmur at the right upper sternal border. Abdomen is soft. Bowel sounds are normal. Extremities reveal no edema. Radial and dorsalis pedis pulses are 2+ Neurological exam is significant for anxiety  Labs: Lab Results  Component Value Date   WBC 8.3 11/30/2014   HGB 13.2 11/30/2014   HCT 38.9 11/30/2014   MCV 92.2 11/30/2014   PLT 225 11/30/2014     Recent Labs Lab 11/30/14 0800  NA 138  K 4.3  CL 103  CO2 24  BUN 12  CREATININE 0.96  CALCIUM 9.2  GLUCOSE 247*   Lab Results  Component Value Date   TROPONINI 0.05* 11/30/2014     Radiology:  No acute disease on chest x-ray  EKG:  Normal sinus rhythm/sinus tachycardia and overall normal  ASSESSMENT:  1. Unstable angina pectoris with mildly elevated troponin. 2. Cardiac murmur compatible with aortic stenosis of uncertain significance 3. Non-insulin-dependent diabetes mellitus 4. Hyperlipidemia  Plan:  1. Coronary angiography and right heart catheterization. The procedure and risks with a right arm approach were discussed in detail with the patient. Risk  of stroke, death, myocardial infarction, limb ischemia, contrast allergy, among others were discussed in detail except above the patient. Emergency surgery was also discussed as a possibility. 2. We discussed the possibility of coronary intervention 3. We discussed the possibility that she has significant aortic stenosis. 4. IV nitroglycerin was started during the interview. Sinclair Grooms 11/30/2014 12:17 PM

## 2014-11-30 NOTE — ED Notes (Signed)
Denies pain, however still feels like she needs to belch. Preparing for transport.

## 2014-11-30 NOTE — ED Notes (Signed)
MD at bedside. 

## 2014-11-30 NOTE — ED Notes (Signed)
Troponin #2 sent to lab as ordered.

## 2014-11-30 NOTE — CV Procedure (Addendum)
Left and Right Heart Catheterization with Coronary Angiography and PCI Report  Sarah Stevenson  59 y.o.  female 01-Feb-1956  Procedure Date: 11/30/2014 Referring Physician: [facility] Primary Cardiologist:: [facility] Blenda Bridegroom, M.D.  INDICATIONS: Unstable angina pectoris in clinical auscultatory evidence of aortic stenosis (previously undiagnosed)   PROCEDURE: 1. Left heart catheterization; 2. Coronary angiography; 3. Right heart catheterization; 4. DES RCA  CONSENT:  The risks, benefits, and details of the procedure were explained in detail to the patient. Risks including death, stroke, heart attack, kidney injury, allergy, limb ischemia, bleeding and radiation injury were discussed.  The patient verbalized understanding and wanted to proceed.  Informed written consent was obtained.  PROCEDURE TECHNIQUE:  After Xylocaine anesthesia a 5 French Slender sheath was placed in the right radial artery with an angiocath and the modified Seldinger technique. An Angiocath in the right antecubital vein, place prior to coming to the cath lab was used as access for performing right heart catheterization. This IV was exchanged over a micropuncture wire and a 5 French brachial sheath placed using the Seldinger technique. A 5 French balloon tip catheter was used for hemodynamic recordings. Coronary angiography was done using a 5 F JR4 and JL 3.5 cm diagnostic catheter. With moderate difficulty we were able to use a straight tipped 0.036 wire to cross the aortic valve. Left ventriculography was done using the JR 4 and hand injection. catheter and hand injection. We then carefully measured the pullback pressure across the aortic valve.  After reviewing all data in the context of the patient's presentation, we felt that she had unstable angina related to high-grade disease in the proximal to mid RCA. There was intermediate stenosis in the LAD. Aortic stenosis was judged to be moderate in severity. We  discussed the findings with the patient and family and decided to proceed with PCI of the RCA. The right heart catheter was removed.  We gave the patient a bolus followed by an infusion of bivalirudin and achieved an ACT that was therapeutic. She was loaded with Brilinta 190 mg orally. We used a 6 [place] JR4 guide catheter. We then used a Cougar 0.014 wire to cross the stenosis. Predilatation was performed with a 2.5 x 15 mm Emerge. We then positioned and deployed a 3.0 x 32 mm Promus Premier drug-eluting stent to 11 atm 2 inflations. Postdilatation was then performed with a 20 x 3.25 mm [place] Emerge. The balloon was expanded to 13 atm. 3 inflations were performed. Final result was felt to be sufficient. 0% stenosis was noted. An TIMI grade 3 flow was noted distally.  Hemostasis in the right radial was achieved with a wrist band using 13 cc of air. The brachial venous sheath will be pulled in 2 hours and hemostasis achieved with manual compression.   CONTRAST:  Total 200 cc cc.  COMPLICATIONS:  None   HEMODYNAMICS:  Aortic pressure 103/57 mmHg; LV pressure 130/-2 mmHg; LVEDP 4 mmHg; RA 1 mmHg; RV 23/1 mmHg; PA 19/2 mmHg; PCWP(mean) 2 mmHg ; Cardiac Output 5 L/m; AV gradient 27 mmHg peak to peak and 20.6 mm mean  ANGIOGRAPHIC DATA:    70 fluoroscopy demonstrates the aortic valve be heavily calcified  The left main coronary artery is normal.  The left anterior descending artery is large and wraps around the left ventricular apex. A large branching first diagonal arises proximally. After the origin of the diagonal the LAD contains segmental 50-70% narrowing.  The left circumflex artery is  moderate in size giving origin to one obtuse marginal branch that bifurcates on the mid lateral wall. Irregularities are noted in the proximal and mid vessel. No high-grade obstruction is seen..  The right coronary artery is this vessel is large. It supplies the large PDA and a bifurcating continuation branch. The  proximal to mid vessel contains segmental narrowing of at least 50% with a relatively focal region of greater than 90% obstruction. The mid vessel beyond the first acute marginal branch contains moderate diffuse disease but no high-grade obstruction. The distal vessel contains an eccentric 30% narrowing.   PCI RESULTS: Segmental 50-95% proximal to mid RCA is reduced to 0% with TIMI grade 3 flow after stenting as described above. No evidence of dissection or embolization is seen. Post stent final balloon diameter 3.25 mm.  LEFT VENTRICULOGRAM:  Left ventricular angiogram was done in the 30 RAO projection and revealed normal LV cavity size with mild to moderate inferior hypokinesis. EF 55%.   IMPRESSIONS:  1. Unstable angina pectoris due to high-grade obstruction in the proximal to mid RCA. A segmental 50 to 95% stenosis was reduced to 0% with DES as described above. 2. Mild to moderate mid LAD obstruction. Circumflex is widely patent. 3. Mild inferior wall hypokinesis. Overall normal LV function with EF 55% 4. Moderate calcific aortic stenosis   RECOMMENDATION:  1. 2-D Doppler echocardiogram to establish baseline 2. Continue IV bivalirudin for 30 minutes 3. Brilinta 90 mg by mouth twice a day 4. Probable discharge in a.m 5. High-dose statin therapy.

## 2014-12-01 ENCOUNTER — Encounter (HOSPITAL_COMMUNITY): Payer: Self-pay | Admitting: Physician Assistant

## 2014-12-01 DIAGNOSIS — Z955 Presence of coronary angioplasty implant and graft: Secondary | ICD-10-CM

## 2014-12-01 DIAGNOSIS — E118 Type 2 diabetes mellitus with unspecified complications: Secondary | ICD-10-CM | POA: Insufficient documentation

## 2014-12-01 DIAGNOSIS — I350 Nonrheumatic aortic (valve) stenosis: Secondary | ICD-10-CM

## 2014-12-01 DIAGNOSIS — R011 Cardiac murmur, unspecified: Secondary | ICD-10-CM | POA: Diagnosis present

## 2014-12-01 DIAGNOSIS — E119 Type 2 diabetes mellitus without complications: Secondary | ICD-10-CM

## 2014-12-01 DIAGNOSIS — I214 Non-ST elevation (NSTEMI) myocardial infarction: Principal | ICD-10-CM

## 2014-12-01 DIAGNOSIS — E785 Hyperlipidemia, unspecified: Secondary | ICD-10-CM | POA: Diagnosis present

## 2014-12-01 LAB — BASIC METABOLIC PANEL
Anion gap: 6 (ref 5–15)
BUN: 14 mg/dL (ref 6–23)
CO2: 27 mmol/L (ref 19–32)
CREATININE: 0.93 mg/dL (ref 0.50–1.10)
Calcium: 8.4 mg/dL (ref 8.4–10.5)
Chloride: 106 mmol/L (ref 96–112)
GFR calc Af Amer: 77 mL/min — ABNORMAL LOW (ref >90)
GFR calc non Af Amer: 66 mL/min — ABNORMAL LOW (ref >90)
GLUCOSE: 167 mg/dL — AB (ref 70–99)
Potassium: 3.8 mmol/L (ref 3.5–5.1)
SODIUM: 139 mmol/L (ref 135–145)

## 2014-12-01 LAB — LIPID PANEL
Cholesterol: 121 mg/dL (ref 0–200)
HDL: 25 mg/dL — AB (ref >39)
LDL CALC: 72 mg/dL (ref 0–99)
Total CHOL/HDL Ratio: 4.8 RATIO
Triglycerides: 121 mg/dL (ref <150)
VLDL: 24 mg/dL (ref 0–40)

## 2014-12-01 LAB — CBC
HEMATOCRIT: 37.2 % (ref 36.0–46.0)
HEMOGLOBIN: 12.5 g/dL (ref 12.0–15.0)
MCH: 31.3 pg (ref 26.0–34.0)
MCHC: 33.6 g/dL (ref 30.0–36.0)
MCV: 93 fL (ref 78.0–100.0)
Platelets: 204 10*3/uL (ref 150–400)
RBC: 4 MIL/uL (ref 3.87–5.11)
RDW: 12.8 % (ref 11.5–15.5)
WBC: 5.7 10*3/uL (ref 4.0–10.5)

## 2014-12-01 LAB — HEPATIC FUNCTION PANEL
ALBUMIN: 3.1 g/dL — AB (ref 3.5–5.2)
ALT: 21 U/L (ref 0–35)
AST: 20 U/L (ref 0–37)
Alkaline Phosphatase: 87 U/L (ref 39–117)
BILIRUBIN INDIRECT: 0.5 mg/dL (ref 0.3–0.9)
Bilirubin, Direct: 0.1 mg/dL (ref 0.0–0.5)
Total Bilirubin: 0.6 mg/dL (ref 0.3–1.2)
Total Protein: 6.3 g/dL (ref 6.0–8.3)

## 2014-12-01 LAB — TROPONIN I: Troponin I: 0.17 ng/mL — ABNORMAL HIGH (ref <0.031)

## 2014-12-01 LAB — GLUCOSE, CAPILLARY: GLUCOSE-CAPILLARY: 198 mg/dL — AB (ref 70–99)

## 2014-12-01 MED ORDER — TICAGRELOR 90 MG PO TABS: 90.0000 mg | ORAL_TABLET | Freq: Two times a day (BID) | ORAL | Status: DC

## 2014-12-01 MED ORDER — NITROGLYCERIN 0.4 MG SL SUBL: 0.4000 mg | SUBLINGUAL_TABLET | SUBLINGUAL | Status: DC | PRN

## 2014-12-01 MED ORDER — CARVEDILOL 3.125 MG PO TABS: 3.1250 mg | ORAL_TABLET | Freq: Two times a day (BID) | ORAL | Status: DC

## 2014-12-01 NOTE — Progress Notes (Signed)
Patient Name: Sarah Stevenson Date of Encounter: 12/01/2014  Principal Problem:   NSTEMI (non-ST elevated myocardial infarction) Active Problems:   Crescendo angina   Diabetes mellitus type 2, noninsulin dependent   Hyperlipidemia LDL goal <70   Murmur, cardiac   Primary Cardiologist: Dr. 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 Cath w/ PCI RCA.  SUBJECTIVE: No chest pain or SOB overnight  OBJECTIVE Filed Vitals:   11/30/14 2353 12/01/14 0000 12/01/14 0517 12/01/14 0717  BP: 124/61 129/59 126/46 149/88  Pulse: 79 81  76  Temp: 97.6 F (36.4 C)  97.6 F (36.4 C) 97.8 F (36.6 C)  TempSrc: Oral  Oral Oral  Resp: 16 17 26 17   Height:      Weight: 197 lb 5 oz (89.5 kg)     SpO2: 98% 98% 97% 97%    Intake/Output Summary (Last 24 hours) at 12/01/14 0810 Last data filed at 12/01/14 0745  Gross per 24 hour  Intake    760 ml  Output   1650 ml  Net   -890 ml   Filed Weights   11/30/14 0742 11/30/14 1507 11/30/14 2353  Weight: 195 lb (88.451 kg) 197 lb 5 oz (89.5 kg) 197 lb 5 oz (89.5 kg)    PHYSICAL EXAM General: Well developed, well nourished, female in no acute distress. Head: Normocephalic, atraumatic.  Neck: Supple without bruits, JVD not elevated. Lungs:  Resp regular and unlabored, CTA. Heart: RRR, S1, diminished S2, no S3, S4, late-peaking systolic murmur best heard over RUSB; no rub. Abdomen: Soft, non-tender, non-distended, BS + x 4.  Extremities: No clubbing, cyanosis, no edema. Right radial cath site without ecchymosis or hematoma Neuro: Alert and oriented X 3. Moves all extremities spontaneously. Psych: Normal affect.  LABS: CBC:  Recent Labs  11/30/14 0800 12/01/14 0425  WBC 8.3 5.7  HGB 13.2 12.5  HCT 38.9 37.2  MCV 92.2 93.0  PLT 225 400   Basic Metabolic Panel:  Recent Labs  11/30/14 0800 12/01/14 0425  NA 138 139  K 4.3 3.8  CL 103 106  CO2 24 27  GLUCOSE 247* 167*  BUN  12 14  CREATININE 0.96 0.93  CALCIUM 9.2 8.4   Liver Function Tests:  Recent Labs  12/01/14 0425  AST 20  ALT 21  ALKPHOS 87  BILITOT 0.6  PROT 6.3  ALBUMIN 3.1*   Cardiac Enzymes:  Recent Labs  11/30/14 1658 11/30/14 2038 12/01/14 0425  TROPONINI 0.06* 0.08* 0.17*   Fasting Lipid Panel:  Recent Labs  12/01/14 0425  CHOL 121  HDL 25*  LDLCALC 72  TRIG 121  CHOLHDL 4.8    TELE: SR, ST, no sig ectopy     Radiology/Studies: Dg Chest Port 1 View 11/30/2014   CLINICAL DATA:  Central chest pressure with shortness of Breath  EXAM: PORTABLE CHEST - 1 VIEW  COMPARISON:  None.  FINDINGS: The heart size and mediastinal contours are within normal limits. Both lungs are clear. The visualized skeletal structures are unremarkable.  IMPRESSION: No active disease.   Electronically Signed   By: Inez Catalina M.D.   On: 11/30/2014 08:06     Current Medications:  . acetaminophen  650 mg Oral 3 times per day  . aspirin  81 mg Oral Daily  . atorvastatin  80 mg Oral q1800  . famotidine  20 mg Oral Daily  . glipiZIDE  10 mg Oral  QAC breakfast  . linagliptin  5 mg Oral Daily  . loratadine  10 mg Oral Daily  . losartan  25 mg Oral Daily  . metFORMIN  1,500 mg Oral Q breakfast  . multivitamin with minerals  1 tablet Oral Daily  . pneumococcal 23 valent vaccine  0.5 mL Intramuscular Tomorrow-1000  . ticagrelor  90 mg Oral BID    Cath:  IMPRESSIONS: 1. Unstable angina pectoris due to high-grade obstruction in the proximal to mid RCA. A segmental of to 95% stenosis was reduced to 0% with DES as described above. 2. Mild to moderate mid LAD obstruction. Circumflex is widely patent. 3. Mild inferior wall hypokinesis. Overall normal LV function with EF 55% 4. Moderate calcific aortic stenosis RECOMMENDATION: 1. 2-D Doppler echocardiogram to establish baseline 2. Continue IV bivalirudin for 30 minutes 3. Brilinta 90 mg by mouth twice a day 4. Probable discharge in a.m 5. High-dose  statin therapy.  ASSESSMENT AND PLAN: Principal Problem:   NSTEMI (non-ST elevated myocardial infarction) - cath results above, s/p DES RCA - on ASA, statin, ARB  Active Problems:   Diabetes mellitus type 2, noninsulin dependent - hold metformin - continue other meds    Hyperlipidemia LDL goal <70 - started on high-dose Lipitor  Plan - cardiac rehab seeing, d/c today and f/u as OP.  Signed, Rosaria Ferries , PA-C 8:10 AM 12/01/2014  The patient was seen and examined, and I agree with the assessment and plan as documented above, with modifications as noted below. 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 No bleeding problems. Found to have moderate aortic stenosis by cath. About to have echocardiogram. S2 is diminished and RUSB murmur is III/VI and late-peaking, suggestive of more severe aortic valve disease. Will discharge to home after echocardiogram. Should participate in cardiac rehabilitation afterwards.

## 2014-12-01 NOTE — Discharge Summary (Signed)
CARDIOLOGY DISCHARGE SUMMARY   Patient ID: GISSELL BARRA MRN: [id number] DOB/AGE: 1956-03-18 59 y.o.  Admit date: 11/30/2014 Discharge date: 12/01/2014  PCP: Woody Seller, MD Primary Cardiologist: Dr. Tamala Julian  Primary Discharge Diagnosis:  Non-STEMI - s/p   RCA Secondary Discharge Diagnosis:    Hyperlipidemia LDL goal <70   Murmur, cardiac   Diabetes, type II, non-insulin-dependent  Procedures: 1. Left heart catheterization; 2. Coronary angiography; 3. Right heart catheterization; 4. DES RCA, 5. 2-D echocardiogram  Hospital Course: SOPHRONIA VARNEY is a 59 y.o. female with no history of CAD. She had been having occasional episodes of exertional burning, that were increasing in frequency and intensity. She was evaluated at [employer] [place] where she works, and her initial troponin was mildly elevated. She was transferred to [facility] for further evaluation and treatment.  She was taken to the cath lab on 11/30/2014, results are below. She had nonobstructive disease in the LAD but her RCA had greater than 90% stenosis. This was treated with PTCA and a drug-eluting stent, which reduced the stenosis to 0. Her EF was normal. She tolerated the procedure well.  She was noted to have a significant heart murmur and a 2-D echo was obtained to evaluate for aortic stenosis which was moderate at cath. Results are pending at the time of dictation.  On 12/01/2014, she was seen by Dr. Bronson Ing and all data were reviewed. Her cath site was without ecchymosis or hematoma. She was seen by cardiac rehabilitation and educated on MI restrictions, heart-healthy lifestyle modifications and exercise guidelines. Her metformin is on hold for 48 hours but she is okay to continue her other diabetes medications. She was started on high-dose statin and a beta blocker. She was not on aspirin, due to allergy, but is tolerating the Brilinta well. She was ambulating without chest pain or shortness of  breath. No further inpatient workup is indicated, and she is considered stable for discharge, to follow up as an outpatient.  Labs:   Lab Results  Component Value Date   WBC 5.7 12/01/2014   HGB 12.5 12/01/2014   HCT 37.2 12/01/2014   MCV 93.0 12/01/2014   PLT 204 12/01/2014    Recent Labs Lab 12/01/14 0425  NA 139  K 3.8  CL 106  CO2 27  BUN 14  CREATININE 0.93  CALCIUM 8.4  PROT 6.3  BILITOT 0.6  ALKPHOS 87  ALT 21  AST 20  GLUCOSE 167*    Recent Labs  11/30/14 1658 11/30/14 2038 12/01/14 0425  TROPONINI 0.06* 0.08* 0.17*   Lipid Panel     Component Value Date/Time   CHOL 121 12/01/2014 0425   TRIG 121 12/01/2014 0425   HDL 25* 12/01/2014 0425   CHOLHDL 4.8 12/01/2014 0425   VLDL 24 12/01/2014 0425   LDLCALC 72 12/01/2014 0425      Radiology: Dg Chest Port 1 View 11/30/2014   CLINICAL DATA:  Central chest pressure with shortness of Breath  EXAM: PORTABLE CHEST - 1 VIEW  COMPARISON:  None.  FINDINGS: The heart size and mediastinal contours are within normal limits. Both lungs are clear. The visualized skeletal structures are unremarkable.  IMPRESSION: No active disease.   Electronically Signed   By: Inez Catalina M.D.   On: 11/30/2014 08:06   Cardiac Cath: 11/30/2014 ANGIOGRAPHIC DATA: 70 fluoroscopy demonstrates the aortic valve be heavily calcified The left main coronary artery is normal. The left anterior descending artery is large and wraps around  the left ventricular apex. A large branching first diagonal arises proximally. After the origin of the diagonal the LAD contains segmental 50-70% narrowing. The left circumflex artery is moderate in size giving origin to one obtuse marginal branch that bifurcates on the mid lateral wall. Irregularities are noted in the proximal and mid vessel. No high-grade obstruction is seen.. The right coronary artery is this vessel is large. It supplies the large PDA and a bifurcating continuation branch. The proximal to mid  vessel contains segmental narrowing of at least 50% with a relatively focal region of greater than 90% obstruction. The mid vessel beyond the first acute marginal branch contains moderate diffuse disease but no high-grade obstruction. The distal vessel contains an eccentric 30% narrowing. PCI RESULTS: Segmental 50-95% proximal to mid RCA is reduced to 0% with TIMI grade 3 flow after stenting as described above. No evidence of dissection or embolization is seen. Post stent final balloon diameter 3.25 mm. LEFT VENTRICULOGRAM: Left ventricular angiogram was done in the 30 RAO projection and revealed normal LV cavity size with mild to moderate inferior hypokinesis. EF 55%. IMPRESSIONS: 1. Unstable angina pectoris due to high-grade obstruction in the proximal to mid RCA. A segmental of to 95% stenosis was reduced to 0% with DES as described above. 2. Mild to moderate mid LAD obstruction. Circumflex is widely patent. 3. Mild inferior wall hypokinesis. Overall normal LV function with EF 55% 4. Moderate calcific aortic stenosis RECOMMENDATION: 1. 2-D Doppler echocardiogram to establish baseline 2. Continue IV bivalirudin for 30 minutes 3. Brilinta 90 mg by mouth twice a day 4. Probable discharge in a.m 5. High-dose statin therapy.  EKG: 11/30/2014, post-cath Sinus rhythm, PACs Heart rate 78, no acute ischemic changes  Echo: 12/01/2014 Results pending  FOLLOW UP PLANS AND APPOINTMENTS Allergies  Allergen Reactions  . Asa [Aspirin] Rash  . Blue Dyes (Parenteral)     rash  . Corn-Containing Products     hives  . Nsaids     rash  . Sugar-Protein-Starch     hives  . Tea     hives     Medication List    ASK your doctor about these medications        ALPRAZolam 0.5 MG tablet  Commonly known as:  XANAX  Take 0.5 mg by mouth at bedtime as needed for anxiety.     cetirizine 10 MG tablet  Commonly known as:  ZYRTEC  Take 10 mg by mouth daily.     fluticasone 50 MCG/ACT nasal spray    Commonly known as:  FLONASE  Place 1 spray into both nostrils daily. For allergies     glipiZIDE 5 MG tablet  Commonly known as:  GLUCOTROL  Take 10 mg by mouth daily before breakfast.     KRILL OIL PO  Take 1 tablet by mouth at bedtime and may repeat dose one time if needed.     losartan 25 MG tablet  Commonly known as:  COZAAR  Take 25 mg by mouth daily.     METFORMIN HCL PO  Take 1,500 mg by mouth daily.     multivitamin capsule  Take 1 capsule by mouth daily.     sitaGLIPtin 100 MG tablet  Commonly known as:  JANUVIA  Take 100 mg by mouth daily.     TYLENOL ARTHRITIS PAIN PO  Take 650 mg by mouth daily as needed. For rash     ZANTAC PO  Take 150 mg by mouth.  Discharge Instructions    Amb Referral to Cardiac Rehabilitation    Complete by:  As directed             BRING ALL MEDICATIONS WITH YOU TO FOLLOW UP APPOINTMENTS  Time spent with patient to include physician time: 41 min Signed: Rosaria Ferries, PA-C 12/01/2014, 8:38 AM Co-Sign MD

## 2014-12-01 NOTE — Progress Notes (Signed)
  Echocardiogram 2D Echocardiogram has been performed.  Sarah Stevenson 12/01/2014, 9:14 AM

## 2014-12-01 NOTE — Progress Notes (Signed)
CARDIAC REHAB PHASE I   PRE:  Rate/Rhythm: 93 SR  BP:  Sitting: 142/64     SaO2: 92% RA  MODE:  Ambulation: 1000 ft   POST:  Rate/Rhythm: 100 ST  BP:  Sitting: 168/61     SaO2: 96% RA  7:49 AM-8:36am Patient tolerated walk well.  Walked independently and conversed entire time.  Patient stated that she was a little short of breath while walking and talking.  No pain or dizziness.  Patient educated on Cardiac Rehab, nutrition, risk factors, restrictions, and exercise.  Patient states that she is interested in cardiac rehab but may have some problems attending due to her work schedule. Patient left in room in chair.   Miche Loughridge, [place], [place] 12/01/2014 8:31 AM

## 2014-12-01 NOTE — Discharge Instructions (Signed)
PLEASE REMEMBER TO BRING ALL OF YOUR MEDICATIONS TO EACH OF YOUR FOLLOW-UP OFFICE VISITS. ° °PLEASE ATTEND ALL SCHEDULED FOLLOW-UP APPOINTMENTS.  ° °Activity: Increase activity slowly as tolerated. You may shower, but no soaking baths (or swimming) for 1 week. No driving for 2 days. No lifting over 5 lbs for 1 week. No sexual activity for 1 week.  ° °You May Return to Work: in 1 week (if applicable) ° °Wound Care: You may wash cath site gently with soap and water. Keep cath site clean and dry. If you notice pain, swelling, bleeding or pus at your cath site, please call [phone]. ° ° ° °Cardiac Cath Site Care °Refer to this sheet in the next few weeks. These instructions provide you with information on caring for yourself after your procedure. Your caregiver may also give you more specific instructions. Your treatment has been planned according to current medical practices, but problems sometimes occur. Call your caregiver if you have any problems or questions after your procedure. °HOME CARE INSTRUCTIONS °· You may shower 24 hours after the procedure. Remove the bandage (dressing) and gently wash the site with plain soap and water. Gently pat the site dry.  °· Do not apply powder or lotion to the site.  °· Do not sit in a bathtub, swimming pool, or whirlpool for 5 to 7 days.  °· No bending, squatting, or lifting anything over 10 pounds (4.5 kg) as directed by your caregiver.  °· Inspect the site at least twice daily.  °· Do not drive home if you are discharged the same day of the procedure. Have someone else drive you.  °· You may drive 24 hours after the procedure unless otherwise instructed by your caregiver.  °What to expect: °· Any bruising will usually fade within 1 to 2 weeks.  °· Blood that collects in the tissue (hematoma) may be painful to the touch. It should usually decrease in size and tenderness within 1 to 2 weeks.  °SEEK IMMEDIATE MEDICAL CARE IF: °· You have unusual pain at the site or down the  affected limb.  °· You have redness, warmth, swelling, or pain at the site.  °· You have drainage (other than a small amount of blood on the dressing).  °· You have chills.  °· You have a fever or persistent symptoms for more than 72 hours.  °· You have a fever and your symptoms suddenly get worse.  °· Your leg becomes pale, cool, tingly, or numb.  °· You have heavy bleeding from the site. Hold pressure on the site.  °Document Released: 09/26/2010 Document Revised: 08/13/2011 Document Reviewed: 09/26/2010 °ExitCare® Patient Information ©2012 [employer]. ° °

## 2014-12-03 MED FILL — Sodium Chloride IV Soln 0.9%: INTRAVENOUS | Qty: 50 | Status: AC

## 2014-12-11 ENCOUNTER — Telehealth (HOSPITAL_COMMUNITY): Payer: Self-pay | Admitting: Cardiac Rehabilitation

## 2014-12-11 NOTE — Telephone Encounter (Signed)
pc to pt to enroll in cardiac rehab. Pt declined at this time due to work schedule conflict. [facility] However pt would like to discuss cardiac rehab participation further with Dr. Tamala Julian and will call to schedule if deemed necessary.

## 2014-12-14 ENCOUNTER — Encounter (HOSPITAL_BASED_OUTPATIENT_CLINIC_OR_DEPARTMENT_OTHER): Payer: Self-pay | Admitting: *Deleted

## 2014-12-14 ENCOUNTER — Telehealth: Payer: Self-pay

## 2014-12-14 ENCOUNTER — Emergency Department (HOSPITAL_BASED_OUTPATIENT_CLINIC_OR_DEPARTMENT_OTHER): Payer: 59

## 2014-12-14 ENCOUNTER — Emergency Department (HOSPITAL_BASED_OUTPATIENT_CLINIC_OR_DEPARTMENT_OTHER)
Admission: EM | Admit: 2014-12-14 | Discharge: 2014-12-14 | Disposition: A | Discharge status: Home / Self Care | Payer: 59 | Attending: Emergency Medicine | Admitting: Emergency Medicine

## 2014-12-14 DIAGNOSIS — Z79899 Other long term (current) drug therapy: Secondary | ICD-10-CM | POA: Diagnosis not present

## 2014-12-14 DIAGNOSIS — Z7951 Long term (current) use of inhaled steroids: Secondary | ICD-10-CM | POA: Diagnosis not present

## 2014-12-14 DIAGNOSIS — Z9889 Other specified postprocedural states: Secondary | ICD-10-CM | POA: Diagnosis not present

## 2014-12-14 DIAGNOSIS — E785 Hyperlipidemia, unspecified: Secondary | ICD-10-CM | POA: Diagnosis not present

## 2014-12-14 DIAGNOSIS — E663 Overweight: Secondary | ICD-10-CM | POA: Diagnosis not present

## 2014-12-14 DIAGNOSIS — R0602 Shortness of breath: Secondary | ICD-10-CM | POA: Diagnosis present

## 2014-12-14 DIAGNOSIS — Z87891 Personal history of nicotine dependence: Secondary | ICD-10-CM | POA: Diagnosis not present

## 2014-12-14 DIAGNOSIS — R011 Cardiac murmur, unspecified: Secondary | ICD-10-CM | POA: Insufficient documentation

## 2014-12-14 DIAGNOSIS — Z8739 Personal history of other diseases of the musculoskeletal system and connective tissue: Secondary | ICD-10-CM | POA: Diagnosis not present

## 2014-12-14 DIAGNOSIS — E119 Type 2 diabetes mellitus without complications: Secondary | ICD-10-CM | POA: Diagnosis not present

## 2014-12-14 LAB — CBC WITH DIFFERENTIAL/PLATELET
BASOS ABS: 0 10*3/uL (ref 0.0–0.1)
BASOS PCT: 0 % (ref 0–1)
Eosinophils Absolute: 0.3 10*3/uL (ref 0.0–0.7)
Eosinophils Relative: 3 % (ref 0–5)
HCT: 38.4 % (ref 36.0–46.0)
HEMOGLOBIN: 12.8 g/dL (ref 12.0–15.0)
Lymphocytes Relative: 30 % (ref 12–46)
Lymphs Abs: 2.4 10*3/uL (ref 0.7–4.0)
MCH: 31.1 pg (ref 26.0–34.0)
MCHC: 33.3 g/dL (ref 30.0–36.0)
MCV: 93.2 fL (ref 78.0–100.0)
Monocytes Absolute: 0.6 10*3/uL (ref 0.1–1.0)
Monocytes Relative: 8 % (ref 3–12)
NEUTROS ABS: 4.6 10*3/uL (ref 1.7–7.7)
NEUTROS PCT: 59 % (ref 43–77)
PLATELETS: 238 10*3/uL (ref 150–400)
RBC: 4.12 MIL/uL (ref 3.87–5.11)
RDW: 12.3 % (ref 11.5–15.5)
WBC: 7.9 10*3/uL (ref 4.0–10.5)

## 2014-12-14 LAB — BASIC METABOLIC PANEL
ANION GAP: 7 (ref 5–15)
BUN: 16 mg/dL (ref 6–23)
CHLORIDE: 104 mmol/L (ref 96–112)
CO2: 27 mmol/L (ref 19–32)
Calcium: 9.3 mg/dL (ref 8.4–10.5)
Creatinine, Ser: 0.92 mg/dL (ref 0.50–1.10)
GFR calc Af Amer: 78 mL/min — ABNORMAL LOW (ref >90)
GFR calc non Af Amer: 67 mL/min — ABNORMAL LOW (ref >90)
Glucose, Bld: 193 mg/dL — ABNORMAL HIGH (ref 70–99)
Potassium: 4.1 mmol/L (ref 3.5–5.1)
Sodium: 138 mmol/L (ref 135–145)

## 2014-12-14 LAB — D-DIMER, QUANTITATIVE: D-Dimer, Quant: 0.42 ug/mL-FEU (ref 0.00–0.48)

## 2014-12-14 LAB — TROPONIN I

## 2014-12-14 LAB — BRAIN NATRIURETIC PEPTIDE: B Natriuretic Peptide: 36.2 pg/mL (ref 0.0–100.0)

## 2014-12-14 MED ORDER — PRASUGREL HCL 10 MG PO TABS: 10.0000 mg | ORAL_TABLET | Freq: Every day | ORAL | Status: DC

## 2014-12-14 NOTE — ED Notes (Signed)
SOB since yesterday. No chest pain.

## 2014-12-14 NOTE — ED Notes (Signed)
MD at bedside. 

## 2014-12-14 NOTE — Discharge Instructions (Signed)

## 2014-12-14 NOTE — ED Provider Notes (Signed)
CSN: [id number]     Arrival date & time 12/14/14  1050 History   First MD Initiated Contact with Patient 12/14/14 1101     Chief Complaint  Patient presents with  . Shortness of Breath     (Consider location/radiation/quality/duration/timing/severity/associated sxs/prior Treatment) HPI  This is a 59 year old female with a history of diabetes, hyperlipidemia, and recent history of in STEMI resulting in stent of RCA proximal 2 weeks ago who presents with shortness of breath. Patient is a Marine scientist in our ED. She has had progressive shortness of breath over the last 2 days. It is worse with ambulation. She denies any chest pain. She denies any leg swelling. No cough or fever. Patient relates shortness of breath 2 her allergies and states "I just also like I can catch my breath sometimes." She is on Brilinta following cath. She denies any orthopnea.  Past Medical History  Diagnosis Date  . Diabetes mellitus   . Osteoarthritis   . Hyperlipidemia   . Murmur, cardiac    Past Surgical History  Procedure Laterality Date  . Knee surgery      left arthroscopy  . Nasal sinus surgery    . Cardiac catheterization Right 11/30/2014    Procedure: RIGHT/LEFT HEART CATH AND CORONARY ANGIOGRAPHY;  Surgeon: Belva Crome, MD;  Location: [facility] CATH LAB;  Service: Cardiovascular;  Laterality: Right;   Family History  Problem Relation Age of Onset  . Heart attack Mother   . Heart attack Father   . Sudden death Father   . Hypertension Neg Hx   . Hyperlipidemia Neg Hx   . Diabetes Neg Hx    History  Substance Use Topics  . Smoking status: Former Research scientist (life sciences)  . Smokeless tobacco: Not on file  . Alcohol Use: No   OB History    No data available     Review of Systems  Constitutional: Negative for fever.  HENT: Positive for sneezing.        Eye fullness  Respiratory: Positive for shortness of breath. Negative for cough and chest tightness.   Cardiovascular: Negative for chest pain, palpitations and leg  swelling.  Gastrointestinal: Negative for nausea, vomiting and abdominal pain.  Genitourinary: Negative for dysuria.  Musculoskeletal: Negative for back pain.  Skin: Negative for wound.  Neurological: Negative for headaches.  Psychiatric/Behavioral: Negative for confusion.  All other systems reviewed and are negative.     Allergies  Asa; Blue dyes (parenteral); Corn-containing products; Nsaids; Sugar-protein-starch; and Tea  Home Medications   Prior to Admission medications   Medication Sig Start Date End Date Taking? Authorizing Provider  atorvastatin (LIPITOR) 20 MG tablet Take 20 mg by mouth daily.   Yes Historical Provider, MD  Acetaminophen (TYLENOL ARTHRITIS PAIN PO) Take 650 mg by mouth daily as needed. For rash    Historical Provider, MD  ALPRAZolam Duanne Moron) 0.5 MG tablet Take 0.5 mg by mouth at bedtime as needed for anxiety.    Historical Provider, MD  carvedilol (COREG) 3.125 MG tablet Take 1 tablet (3.125 mg total) by mouth 2 (two) times daily with a meal. 12/01/14   Rhonda G Barrett, PA-C  cetirizine (ZYRTEC) 10 MG tablet Take 10 mg by mouth daily.    Historical Provider, MD  fluticasone (FLONASE) 50 MCG/ACT nasal spray Place 1 spray into both nostrils daily. For allergies    Historical Provider, MD  glipiZIDE (GLUCOTROL) 5 MG tablet Take 10 mg by mouth daily before breakfast.     Historical Provider, MD  KRILL OIL  PO Take 1 tablet by mouth at bedtime and may repeat dose one time if needed.     Historical Provider, MD  losartan (COZAAR) 25 MG tablet Take 25 mg by mouth daily.     Historical Provider, MD  METFORMIN HCL PO Take 1,500 mg by mouth daily.     Historical Provider, MD  Multiple Vitamin (MULTIVITAMIN) capsule Take 1 capsule by mouth daily.    Historical Provider, MD  nitroGLYCERIN (NITROSTAT) 0.4 MG SL tablet Place 1 tablet (0.4 mg total) under the tongue every 5 (five) minutes as needed for chest pain. 12/01/14   Rhonda G Barrett, PA-C  Ranitidine HCl (ZANTAC PO)  Take 150 mg by mouth.    Historical Provider, MD  sitaGLIPtin (JANUVIA) 100 MG tablet Take 100 mg by mouth daily.    Historical Provider, MD  ticagrelor (BRILINTA) 90 MG TABS tablet Take 1 tablet (90 mg total) by mouth 2 (two) times daily. 12/01/14   Rhonda G Barrett, PA-C   BP 128/60 mmHg  Pulse 61  Resp 17  Ht 5\' 3"  (1.6 m)  Wt 197 lb (89.359 kg)  BMI 34.91 kg/m2  SpO2 100% Physical Exam  Constitutional: She is oriented to person, place, and time. She appears well-developed and well-nourished. No distress.  Overweight  HENT:  Head: Normocephalic and atraumatic.  Eyes: Pupils are equal, round, and reactive to light.  Cardiovascular: Normal rate and regular rhythm.   Murmur heard. Pulmonary/Chest: Effort normal and breath sounds normal. No respiratory distress. She has no wheezes.  Abdominal: Soft. Bowel sounds are normal. There is no tenderness. There is no rebound and no guarding.  Musculoskeletal: She exhibits no edema.  Neurological: She is alert and oriented to person, place, and time.  Skin: Skin is warm and dry.  Psychiatric: She has a normal mood and affect.  Nursing note and vitals reviewed.   ED Course  Procedures (including critical care time) Labs Review Labs Reviewed  BASIC METABOLIC PANEL - Abnormal; Notable for the following:    Glucose, Bld 193 (*)    GFR calc non Af Amer 67 (*)    GFR calc Af Amer 78 (*)    All other components within normal limits  CBC WITH DIFFERENTIAL/PLATELET  BRAIN NATRIURETIC PEPTIDE  TROPONIN I  D-DIMER, QUANTITATIVE    Imaging Review Dg Chest 2 View  12/14/2014   CLINICAL DATA:  Shortness of breath two days. MI in 2 weeks ago with stent placed.  EXAM: CHEST  2 VIEW  COMPARISON:  11/30/2014  FINDINGS: Lungs are adequately inflated without consolidation or effusion. Cardiomediastinal silhouette is normal. There is mild degenerative change of the spine.  IMPRESSION: No active cardiopulmonary disease.   Electronically Signed   By:  Marin Olp M.D.   On: 12/14/2014 11:36     EKG Interpretation   Date/Time:  Friday December 14 2014 10:55:07 EDT Ventricular Rate:  67 PR Interval:  140 QRS Duration: 94 QT Interval:  418 QTC Calculation: 441 R Axis:   40 Text Interpretation:  Normal sinus rhythm Normal ECG Confirmed by Melat Wrisley   MD, Loma Sousa ([id number]) on 12/14/2014 12:10:11 PM      MDM   Final diagnoses:  Shortness of breath    Patient presents with shortness of breath. Had a cardiac stent placed 2 weeks ago. Is nontoxic on exam and vital signs are reassuring. Pulmonary and cardiac exams are reassuring. No evidence of volume overload. EKG is normal sinus rhythm and no evidence of acute ischemia. Chest x-ray  is negative.  Further workup including d-dimer and troponin is reassuring. Discussed with Dr. Meda Coffee on call for cardiology. No indication at this time for further workup including repeat echo. If patient has any worsening shortness of breath, onset of chest pain, lower extremity swelling or new or concerning symptoms she should be reevaluated immediately. She has cardiology follow-up on Thursday.  After history, exam, and medical workup I feel the patient has been appropriately medically screened and is safe for discharge home. Pertinent diagnoses were discussed with the patient. Patient was given return precautions.     Merryl Hacker, MD 12/14/14 [phone]

## 2014-12-14 NOTE — Telephone Encounter (Signed)
Pt has spoken to Dr.Smith directly she will switch form Brilinta( pt having sob) to Effient. Rx sent to pt pharmacy

## 2014-12-19 DIAGNOSIS — I2510 Atherosclerotic heart disease of native coronary artery without angina pectoris: Secondary | ICD-10-CM | POA: Insufficient documentation

## 2014-12-20 ENCOUNTER — Ambulatory Visit (INDEPENDENT_AMBULATORY_CARE_PROVIDER_SITE_OTHER): Payer: 59 | Admitting: Interventional Cardiology

## 2014-12-20 ENCOUNTER — Encounter: Payer: Self-pay | Admitting: Interventional Cardiology

## 2014-12-20 VITALS — BP 120/78 | HR 74 | Ht 63.0 in | Wt 205.8 lb

## 2014-12-20 DIAGNOSIS — I2510 Atherosclerotic heart disease of native coronary artery without angina pectoris: Secondary | ICD-10-CM

## 2014-12-20 DIAGNOSIS — R0683 Snoring: Secondary | ICD-10-CM

## 2014-12-20 DIAGNOSIS — E785 Hyperlipidemia, unspecified: Secondary | ICD-10-CM | POA: Diagnosis not present

## 2014-12-20 DIAGNOSIS — E119 Type 2 diabetes mellitus without complications: Secondary | ICD-10-CM | POA: Diagnosis not present

## 2014-12-20 NOTE — Progress Notes (Signed)
Cardiology Office Note   Date:  12/20/2014   ID:  Sarah Stevenson, DOB 1955/11/13, MRN [id number]  PCP:  Woody Seller, MD  Cardiologist:   Sinclair Grooms, MD   Chief Complaint  Patient presents with  . Coronary Artery Disease    ACS      History of Present Illness: Sarah Stevenson is a 59 y.o. female who presents for recently diagnosed coronary disease with DES RCA in March 2016 for unstable angina. She has done well since discharge from the hospital. She had dyspnea on breath length. We switched to Effient. She still complains of exertional fatigue. She snores loudly. She refused cardiac rehabilitation.    Past Medical History  Diagnosis Date  . Diabetes mellitus   . Osteoarthritis   . Hyperlipidemia   . Murmur, cardiac     Past Surgical History  Procedure Laterality Date  . Knee surgery      left arthroscopy  . Nasal sinus surgery    . Cardiac catheterization Right 11/30/2014    Procedure: RIGHT/LEFT HEART CATH AND CORONARY ANGIOGRAPHY;  Surgeon: Belva Crome, MD;  Location: [facility] CATH LAB;  Service: Cardiovascular;  Laterality: Right;     Current Outpatient Prescriptions  Medication Sig Dispense Refill  . Acetaminophen (TYLENOL ARTHRITIS PAIN PO) Take 650 mg by mouth daily as needed. For rash    . ALPRAZolam (XANAX) 0.5 MG tablet Take 0.5 mg by mouth at bedtime as needed for anxiety.    Marland Kitchen atorvastatin (LIPITOR) 20 MG tablet Take 20 mg by mouth daily.    . carvedilol (COREG) 3.125 MG tablet Take 1 tablet (3.125 mg total) by mouth 2 (two) times daily with a meal. 60 tablet 11  . cetirizine (ZYRTEC) 10 MG tablet Take 10 mg by mouth daily.    . fluticasone (FLONASE) 50 MCG/ACT nasal spray Place 1 spray into both nostrils daily. For allergies    . glipiZIDE (GLUCOTROL) 5 MG tablet Take 10 mg by mouth daily before breakfast.     . KRILL OIL PO Take 1 tablet by mouth at bedtime and may repeat dose one time if needed.     Marland Kitchen losartan (COZAAR) 25 MG tablet Take  25 mg by mouth daily.     Marland Kitchen METFORMIN HCL PO Take 1,500 mg by mouth daily.     . Multiple Vitamin (MULTIVITAMIN) capsule Take 1 capsule by mouth daily.    . nitroGLYCERIN (NITROSTAT) 0.4 MG SL tablet Place 1 tablet (0.4 mg total) under the tongue every 5 (five) minutes as needed for chest pain. 25 tablet 3  . prasugrel (EFFIENT) 10 MG TABS tablet Take 1 tablet (10 mg total) by mouth daily. 30 tablet 11  . Ranitidine HCl (ZANTAC PO) Take 150 mg by mouth.    . sitaGLIPtin (JANUVIA) 100 MG tablet Take 100 mg by mouth daily.     No current facility-administered medications for this visit.    Allergies:   Asa; Blue dyes (parenteral); Corn-containing products; Nsaids; Sugar-protein-starch; and Tea    Social History:  The patient  reports that she has quit smoking. She does not have any smokeless tobacco history on file. She reports that she does not drink alcohol or use illicit drugs.   Family History:  The patient's family history includes Heart attack in her father and mother; Sudden death in her father. There is no history of Hypertension, Hyperlipidemia, or Diabetes.    ROS:  Please see the history of present illness.  Otherwise, review of systems are positive for dyspnea and fatigue with history of snoring.   All other systems are reviewed and negative.    PHYSICAL EXAM: VS:  BP 120/78 mmHg  Pulse 74  Ht 5\' 3"  (1.6 m)  Wt 205 lb 12.8 oz (93.35 kg)  BMI 36.46 kg/m2 , BMI Body mass index is 36.46 kg/(m^2). GEN: Well nourished, well developed, in no acute distress HEENT: normal Neck: no JVD, carotid bruits, or masses Cardiac: RRR; no murmurs, rubs, or gallops,no edema  Respiratory:  clear to auscultation bilaterally, normal work of breathing GI: soft, nontender, nondistended, + BS MS: no deformity or atrophy Skin: warm and dry, no rash Neuro:  Strength and sensation are intact Psych: euthymic mood, full affect   EKG:  EKG is not ordered today.    Recent Labs: 12/01/2014: ALT  21 12/14/2014: B Natriuretic Peptide 36.2; BUN 16; Creatinine 0.92; Hemoglobin 12.8; Platelets 238; Potassium 4.1; Sodium 138    Lipid Panel    Component Value Date/Time   CHOL 121 12/01/2014 0425   TRIG 121 12/01/2014 0425   HDL 25* 12/01/2014 0425   CHOLHDL 4.8 12/01/2014 0425   VLDL 24 12/01/2014 0425   LDLCALC 72 12/01/2014 0425      Wt Readings from Last 3 Encounters:  12/20/14 205 lb 12.8 oz (93.35 kg)  12/14/14 197 lb (89.359 kg)  11/30/14 197 lb 5 oz (89.5 kg)      Other studies Reviewed: Additional studies/ records that were reviewed today include: .    ASSESSMENT AND PLAN:  Coronary artery disease involving native coronary artery of native heart without angina pectoris [place] no recurrence of angina  Diabetes mellitus type 2, noninsulin dependent  Hyperlipidemia LDL goal <70  Snoring: Sllep study to r/o Sleep apnea     Current medicines are reviewed at length with the patient today.  The patient does not have concerns regarding medicines.  The following changes have been made:  no change  Labs/ tests ordered today include:   Orders Placed This Encounter  Procedures  . Split night study     Disposition:   FU with Linard Millers  in 6 months   Signed, Sinclair Grooms, MD  12/20/2014 11:24 AM    [facility] [place], [place], [place]  [address] Phone: [phone]; Fax: [phone]

## 2014-12-20 NOTE — Patient Instructions (Signed)
Your physician recommends that you continue on your current medications as directed. Please refer to the Current Medication list given to you today.  Your physician has recommended that you have a sleep study. This test records several body functions during sleep, including: brain activity, eye movement, oxygen and carbon dioxide blood levels, heart rate and rhythm, breathing rate and rhythm, the flow of air through your mouth and nose, snoring, body muscle movements, and chest and belly movement.   Your physician discussed the importance of regular exercise and recommended that you start or continue a regular exercise program for good health.  Your physician wants you to follow-up in: 6 months with Dr.Smith You will receive a reminder letter in the mail two months in advance. If you don't receive a letter, please call our office to schedule the follow-up appointment.

## 2015-02-27 ENCOUNTER — Ambulatory Visit (HOSPITAL_BASED_OUTPATIENT_CLINIC_OR_DEPARTMENT_OTHER): Payer: 59 | Attending: Interventional Cardiology

## 2015-02-27 VITALS — Ht 63.0 in | Wt 200.0 lb

## 2015-02-27 DIAGNOSIS — G4719 Other hypersomnia: Secondary | ICD-10-CM

## 2015-02-27 DIAGNOSIS — R0683 Snoring: Secondary | ICD-10-CM

## 2015-03-03 ENCOUNTER — Telehealth: Payer: Self-pay | Admitting: Cardiology

## 2015-03-03 DIAGNOSIS — G4719 Other hypersomnia: Secondary | ICD-10-CM | POA: Insufficient documentation

## 2015-03-03 NOTE — Telephone Encounter (Signed)
Please let patient know that sleep study showed no significant sleep apnea.

## 2015-03-03 NOTE — Sleep Study (Signed)
   NAME: Sarah Stevenson DATE OF BIRTH:  Apr 25, 1956 MEDICAL RECORD NUMBER [id number]  LOCATION: [facility]  PHYSICIAN: TURNER,TRACI R  DATE OF STUDY: 02/27/2015  SLEEP STUDY TYPE: Nocturnal Polysomnogram               REFERRING PHYSICIAN: Belva Crome, MD  INDICATION FOR STUDY: snoring, excessive daytime sleepiness  EPWORTH SLEEPINESS SCORE: 11 HEIGHT: '5\' 3"'$  (160 cm)  WEIGHT: 200 lb (90.719 kg)    Body mass index is 35.44 kg/(m^2).  NECK SIZE: 15 in.  MEDICATIONS: Reviewed in the chart  SLEEP ARCHITECTURE: The patient slept for a total of 341 minutes out of a total sleep period time of 365 minutes.  There was 0.5 minutes of slow wave sleep and 87 minutes of REM sleep.  The onset to sleep latency was normal at 15 minutes.  The onset to REM sleep latency was normal at 113 minutes.  The sleep efficiency was normal at 90%.    RESPIRATORY DATA: The patient had no apneas and 7 hypopneas.  The AHI was 1.2 events per hour which is consistent with normal sleep.  There was mild snoring.  OXYGEN DATA: The average oxygen saturation was 92%.  The lowest oxygen saturation was 88%.    CARDIAC DATA: The patient maintained NSR during the study with the average heart rate 77 bpm and the loweset heart rate 37 bpm.    MOVEMENT/PARASOMNIA: There were no periodic limb movements or REM sleep behavior disorders noted.    IMPRESSION/ RECOMMENDATION:   1.  No evidence of sleep disordered breathing. 2.  Normal sleep efficiency. 3.  Abnormal sleep architecture with minimal slow wave sleep. 4.  NSR was maintained throughout the study. 5.  No signiifcant oxygen desaturations were noted. The average oxygen saturation was 92%.  6.  There was mild snoring. 7.  No indication for further treatment at this time.  The patient should pay careful attention to proper sleep hygiene, weight reduction for elevated BMI, avoidance of sleeping in the supine position and avoidance of alcohol within  four hours of bedtime.   Signed:  Sueanne Margarita Diplomate, American Board of Sleep Medicine  ELECTRONICALLY SIGNED ON:  03/03/2015, 8:20 PM [place] PH: [phone]   FX: [phone] [place]

## 2015-03-04 NOTE — Telephone Encounter (Signed)
Left message to call back  

## 2015-03-05 NOTE — Telephone Encounter (Signed)
Patient is aware of results.

## 2015-03-05 NOTE — Telephone Encounter (Signed)
Left message with husband to have her call back

## 2015-08-08 ENCOUNTER — Emergency Department (INDEPENDENT_AMBULATORY_CARE_PROVIDER_SITE_OTHER): Payer: 59

## 2015-08-08 ENCOUNTER — Encounter: Payer: Self-pay | Admitting: *Deleted

## 2015-08-08 ENCOUNTER — Emergency Department
Admission: EM | Admit: 2015-08-08 | Discharge: 2015-08-08 | Disposition: A | Discharge status: Home / Self Care | Payer: 59 | Source: Home / Self Care | Attending: Family Medicine | Admitting: Family Medicine

## 2015-08-08 DIAGNOSIS — M25552 Pain in left hip: Secondary | ICD-10-CM | POA: Diagnosis not present

## 2015-08-08 DIAGNOSIS — M7062 Trochanteric bursitis, left hip: Secondary | ICD-10-CM

## 2015-08-08 DIAGNOSIS — M1652 Unilateral post-traumatic osteoarthritis, left hip: Secondary | ICD-10-CM | POA: Diagnosis not present

## 2015-08-08 NOTE — Discharge Instructions (Signed)
Apply ice pack for 20 to 30 minutes, 2 to 3 times daily  Continue until pain decreases.  May continue Celebrex.   Hip Bursitis Bursitis is a swelling and soreness (inflammation) of a fluid-filled sac (bursa). This sac overlies and protects the joints.  CAUSES   Injury.  Overuse of the muscles surrounding the joint.  Arthritis.  Gout.  Infection.  Cold weather.  Inadequate warm-up and conditioning prior to activities. The cause may not be known.  SYMPTOMS   Mild to severe irritation.  Tenderness and swelling over the outside of the hip.  Pain with motion of the hip.  If the bursa becomes infected, a fever may be present. Redness, tenderness, and warmth will develop over the hip. Symptoms usually lessen in 3 to 4 weeks with treatment, but can come back. TREATMENT If conservative treatment does not work, your caregiver may advise draining the bursa and injecting cortisone into the area. This may speed up the healing process. This may also be used as an initial treatment of choice. HOME CARE INSTRUCTIONS   Apply ice to the affected area for 15-20 minutes every 3 to 4 hours while awake for the first 2 days. Put the ice in a plastic bag and place a towel between the bag of ice and your skin.  Rest the painful joint as much as possible, but continue to put the joint through a normal range of motion at least 4 times per day. When the pain lessens, begin normal, slow movements and usual activities to help prevent stiffness of the hip.  Only take over-the-counter or prescription medicines for pain, discomfort, or fever as directed by your caregiver.  Use crutches to limit weight bearing on the hip joint, if advised.  Elevate your painful hip to reduce swelling. Use pillows for propping and cushioning your legs and hips.  Gentle massage may provide comfort and decrease swelling. SEEK IMMEDIATE MEDICAL CARE IF:   Your pain increases even during treatment, or you are not  improving.  You have a fever.  You have heat and inflammation over the involved bursa.  You have any other questions or concerns. MAKE SURE YOU:   Understand these instructions.  Will watch your condition.  Will get help right away if you are not doing well or get worse.   This information is not intended to replace advice given to you by your health care provider. Make sure you discuss any questions you have with your health care provider.   Document Released: 02/13/2002 Document Revised: 11/16/2011 Document Reviewed: 03/26/2015 Elsevier Interactive Patient Education [employer].

## 2015-08-08 NOTE — ED Notes (Signed)
Pt c/o 1 1/2 years of intermittent left hip and thigh pain, worsening the past 6 months and severe this AM. No h/o injury but has injured and had surgery on left knee. Knee is without cartilage.

## 2015-08-08 NOTE — ED Provider Notes (Signed)
CSN: [id number]     Arrival date & time 08/08/15  1153 History   First MD Initiated Contact with Patient 08/08/15 1217     Chief Complaint  Patient presents with  . Hip Pain  . Leg Pain      HPI Comments: Patient complains of approximately 6 month history of generally constant left hip pain.  She recalls having slipping on ice 1.5 years ago, landing on her left hip/back.  Her initial pain at that time improved.  She also reports a history of left knee injury 1996 requiring arthroscopic surgery.  She now has chronic left knee pain, although Celebrex is helpful.  She usually has pain in her left lateral hip, and today has experienced vague pain in her left anterior thigh.  She has minimal left low back pain.  Her pain is worse when climbing stairs, and exiting a car.  Patient is a 59 y.o. female presenting with hip pain. The history is provided by the patient.  Hip Pain This is a chronic problem. Episode onset: 6 months ago. The problem occurs daily. The problem has been gradually worsening. Exacerbated by: walking stairs. Nothing relieves the symptoms. Treatments tried: Celebrex. The treatment provided moderate relief.    Past Medical History  Diagnosis Date  . Diabetes mellitus   . Osteoarthritis   . Hyperlipidemia   . Murmur, cardiac    Past Surgical History  Procedure Laterality Date  . Knee surgery      left arthroscopy  . Nasal sinus surgery    . Cardiac catheterization Right 11/30/2014    Procedure: RIGHT/LEFT HEART CATH AND CORONARY ANGIOGRAPHY;  Surgeon: Belva Crome, MD;  Location: [facility] CATH LAB;  Service: Cardiovascular;  Laterality: Right;   Family History  Problem Relation Age of Onset  . Heart attack Mother   . Heart attack Father   . Sudden death Father   . Hypertension Neg Hx   . Hyperlipidemia Neg Hx   . Diabetes Neg Hx    Social History  Substance Use Topics  . Smoking status: Former Research scientist (life sciences)  . Smokeless tobacco: None  . Alcohol Use: No   OB History    No  data available     Review of Systems  All other systems reviewed and are negative.   Allergies  Asa; Blue dyes (parenteral); Corn-containing products; Nsaids; Sugar-protein-starch; and Tea  Home Medications   Prior to Admission medications   Medication Sig Start Date End Date Taking? Authorizing Provider  Acetaminophen (TYLENOL ARTHRITIS PAIN PO) Take 650 mg by mouth daily as needed. For rash   Yes Historical Provider, MD  atorvastatin (LIPITOR) 20 MG tablet Take 20 mg by mouth daily.   Yes Historical Provider, MD  carvedilol (COREG) 3.125 MG tablet Take 1 tablet (3.125 mg total) by mouth 2 (two) times daily with a meal. 12/01/14  Yes Rhonda G Barrett, PA-C  celecoxib (CELEBREX) 100 MG capsule Take 100 mg by mouth 2 (two) times daily.   Yes Historical Provider, MD  glipiZIDE (GLUCOTROL) 5 MG tablet Take 10 mg by mouth daily before breakfast.    Yes Historical Provider, MD  losartan (COZAAR) 25 MG tablet Take 25 mg by mouth daily.    Yes Historical Provider, MD  METFORMIN HCL PO Take 1,500 mg by mouth daily.    Yes Historical Provider, MD  Multiple Vitamin (MULTIVITAMIN) capsule Take 1 capsule by mouth daily.   Yes Historical Provider, MD  prasugrel (EFFIENT) 10 MG TABS tablet Take 1 tablet (10  mg total) by mouth daily. 12/14/14  Yes Belva Crome, MD  sitaGLIPtin (JANUVIA) 100 MG tablet Take 100 mg by mouth daily.   Yes Historical Provider, MD  Thiamine HCl (VITAMIN B-1 PO) Take by mouth.   Yes Historical Provider, MD  ALPRAZolam Duanne Moron) 0.5 MG tablet Take 0.5 mg by mouth at bedtime as needed for anxiety.    Historical Provider, MD  cetirizine (ZYRTEC) 10 MG tablet Take 10 mg by mouth daily.    Historical Provider, MD  fluticasone (FLONASE) 50 MCG/ACT nasal spray Place 1 spray into both nostrils daily. For allergies    Historical Provider, MD  KRILL OIL PO Take 1 tablet by mouth at bedtime and may repeat dose one time if needed.     Historical Provider, MD  nitroGLYCERIN (NITROSTAT) 0.4 MG  SL tablet Place 1 tablet (0.4 mg total) under the tongue every 5 (five) minutes as needed for chest pain. 12/01/14   Rhonda G Barrett, PA-C  Ranitidine HCl (ZANTAC PO) Take 150 mg by mouth.    Historical Provider, MD   Meds Ordered and Administered this Visit  Medications - No data to display  BP 146/89 mmHg  Pulse 77  Resp 18  Wt 213 lb (96.616 kg)  SpO2 95% No data found.   Physical Exam  Constitutional: She is oriented to person, place, and time. She appears well-developed and well-nourished. No distress.  HENT:  Head: Normocephalic.  Eyes: Conjunctivae are normal. Pupils are equal, round, and reactive to light.  Neck: Normal range of motion.  Cardiovascular: Normal rate and regular rhythm.   Murmur heard. Pulmonary/Chest: Breath sounds normal.  Abdominal: There is no tenderness.  Musculoskeletal: She exhibits no edema.       Left hip: She exhibits decreased range of motion, decreased strength, tenderness and bony tenderness. She exhibits no swelling, no crepitus and no deformity.       Legs: Left hip has decreased range of motion, especially external rotation.  There is tenderness to palpation over the left inguinal ligament area with resisted flexion of the left hip.  Left hip reveals distinct tenderness over the greater trochanter.  Palpating the greater trochanter during resisted lateral abduction of the hip recreates her pain.   Straight leg-raise and sitting knee extension tests are negative.  Patellar and achilles reflexes are normal.  Neurological: She is alert and oriented to person, place, and time.  Skin: Skin is warm and dry. No rash noted.  Nursing note and vitals reviewed.   ED Course  Procedures  None   Imaging Review Dg Hip Unilat With Pelvis 2-3 Views Left  08/08/2015  CLINICAL DATA:  Left hip pain, chronic, with acute exacerbation. EXAM: DG HIP (WITH OR WITHOUT PELVIS) 2-3V LEFT COMPARISON:  None. FINDINGS: Frontal pelvis as well as frontal and lateral  left hip images were obtained. There is no fracture or dislocation. There is extensive osteoarthritic change in the left hip joint with rather marked narrowing and mild bony remodeling of the femoral head. There is moderate narrowing of the right hip joint. No erosive change. IMPRESSION: Osteoarthritic change in both hip joints, considerably more severe on the left than on the right. No acute fracture or dislocation. Electronically Signed   By: Lowella Grip III M.D.   On: 08/08/2015 13:28     MDM   1. Post-traumatic osteoarthritis of left hip   2. Trochanteric bursitis of left hip    Suspect that DJD of left hip is a consequence of her  injury 1.5 years ago. Apply ice pack for 20 to 30 minutes, 2 to 3 times daily  Continue until pain decreases.  May continue Celebrex. Will refer to Dr. Georgina Snell for further management.    Kandra Nicolas, MD 08/08/15 1350

## 2015-08-14 ENCOUNTER — Encounter: Payer: Self-pay | Admitting: Family Medicine

## 2015-08-14 ENCOUNTER — Ambulatory Visit (INDEPENDENT_AMBULATORY_CARE_PROVIDER_SITE_OTHER): Payer: 59 | Admitting: Family Medicine

## 2015-08-14 VITALS — BP 138/68 | HR 77 | Wt 216.0 lb

## 2015-08-14 DIAGNOSIS — M7062 Trochanteric bursitis, left hip: Secondary | ICD-10-CM | POA: Diagnosis not present

## 2015-08-14 DIAGNOSIS — M1612 Unilateral primary osteoarthritis, left hip: Secondary | ICD-10-CM | POA: Insufficient documentation

## 2015-08-14 DIAGNOSIS — M1990 Unspecified osteoarthritis, unspecified site: Secondary | ICD-10-CM

## 2015-08-14 MED ORDER — METHOCARBAMOL 500 MG PO TABS: 500.0000 mg | ORAL_TABLET | Freq: Three times a day (TID) | ORAL | Status: DC

## 2015-08-14 NOTE — Patient Instructions (Signed)
Thank you for coming in today. Attend PT.  Return in 6 weeks.   Hip Bursitis Bursitis is a swelling and soreness (inflammation) of a fluid-filled sac (bursa). This sac overlies and protects the joints.  CAUSES   Injury.  Overuse of the muscles surrounding the joint.  Arthritis.  Gout.  Infection.  Cold weather.  Inadequate warm-up and conditioning prior to activities. The cause may not be known.  SYMPTOMS   Mild to severe irritation.  Tenderness and swelling over the outside of the hip.  Pain with motion of the hip.  If the bursa becomes infected, a fever may be present. Redness, tenderness, and warmth will develop over the hip. Symptoms usually lessen in 3 to 4 weeks with treatment, but can come back. TREATMENT If conservative treatment does not work, your caregiver may advise draining the bursa and injecting cortisone into the area. This may speed up the healing process. This may also be used as an initial treatment of choice. HOME CARE INSTRUCTIONS   Apply ice to the affected area for 15-20 minutes every 3 to 4 hours while awake for the first 2 days. Put the ice in a plastic bag and place a towel between the bag of ice and your skin.  Rest the painful joint as much as possible, but continue to put the joint through a normal range of motion at least 4 times per day. When the pain lessens, begin normal, slow movements and usual activities to help prevent stiffness of the hip.  Only take over-the-counter or prescription medicines for pain, discomfort, or fever as directed by your caregiver.  Use crutches to limit weight bearing on the hip joint, if advised.  Elevate your painful hip to reduce swelling. Use pillows for propping and cushioning your legs and hips.  Gentle massage may provide comfort and decrease swelling. SEEK IMMEDIATE MEDICAL CARE IF:   Your pain increases even during treatment, or you are not improving.  You have a fever.  You have heat and  inflammation over the involved bursa.  You have any other questions or concerns. MAKE SURE YOU:   Understand these instructions.  Will watch your condition.  Will get help right away if you are not doing well or get worse.   This information is not intended to replace advice given to you by your health care provider. Make sure you discuss any questions you have with your health care provider.   Document Released: 02/13/2002 Document Revised: 11/16/2011 Document Reviewed: 03/26/2015 Elsevier Interactive Patient Education [employer].

## 2015-08-14 NOTE — Assessment & Plan Note (Signed)
Patient has left greater trochanteric bursitis associated with hip abduction weakness. Plan for physical therapy. Return in 6 weeks. Include iontophoresis as needed.

## 2015-08-14 NOTE — Progress Notes (Signed)
   Subjective:    I'm seeing this patient as a consultation for:  Dr Assunta Found   CC: Left hip pain  HPI: Patient was seen in urgent care on December 1 with left lateral hip pain. She was thought to have greater trochanteric bursitis with a component of hip DJD based on x-ray. Pain is been present for months now. Pain seemed to worsen after a fall a few months ago. She notes pain is predominantly in the lateral hip and is worse with standing from a seated position and lying on her left side. She additionally has some anterior hip pain and it is especially worse with hip flexion. For example she has difficulty putting her left sock on. Overall she's doing pretty well from a pain standpoint. She is improved from the visit from urgent care. 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 She takes Robaxin intermittently for overall aches and pains.  Past medical history, Surgical history, Family history not pertinant except as noted below, Social history, Allergies, and medications have been entered into the medical record, reviewed, and no changes needed.   Review of Systems: No headache, visual changes, nausea, vomiting, diarrhea, constipation, dizziness, abdominal pain, skin rash, fevers, chills, night sweats, weight loss, swollen lymph nodes, body aches, joint swelling, muscle aches, chest pain, shortness of breath, mood changes, visual or auditory hallucinations.   Objective:    Filed Vitals:   08/14/15 0938  BP: 138/68  Pulse: 77   General: Well Developed, well nourished, and in no acute distress.  Neuro/Psych: Alert and oriented x3, extra-ocular muscles intact, able to move all 4 extremities, sensation grossly intact. Skin: Warm and dry, no rashes noted.  Respiratory: Not using accessory muscles, speaking in full sentences, trachea midline.  Cardiovascular: Pulses palpable, no extremity edema. Abdomen: Does not appear distended. MSK: Left hip is normal-appearing Range of motion limited  flexion and rotational range of motion by pain. Tender palpation left lateral greater trochanter. Hip strength shows decreased strength 4/5 abduction. Right hip: Normal motion. Nontender. Normal strength. Leg lengths equal Normal gait   X-ray hip dated 08/08/2015 reviewed   No results found for this or any previous visit (from the past 24 hour(s)). No results found.  Impression and Recommendations:   This case required medical decision making of moderate complexity.

## 2015-08-14 NOTE — Assessment & Plan Note (Signed)
Patient certainly has DJD of the left hip and that certainly is contributing to her pain. Overall that component of pain does not seem to be especially bothersome. Discussed options including ultrasound guided diagnostic and therapeutic injection and the possibility of a hip replacement in her future. Return in 6 weeks.

## 2015-08-22 ENCOUNTER — Encounter: Payer: Self-pay | Admitting: Rehabilitative and Restorative Service Providers"

## 2015-08-22 ENCOUNTER — Ambulatory Visit (INDEPENDENT_AMBULATORY_CARE_PROVIDER_SITE_OTHER): Payer: 59 | Admitting: Rehabilitative and Restorative Service Providers"

## 2015-08-22 DIAGNOSIS — M623 Immobility syndrome (paraplegic): Secondary | ICD-10-CM

## 2015-08-22 DIAGNOSIS — R6889 Other general symptoms and signs: Secondary | ICD-10-CM

## 2015-08-22 DIAGNOSIS — M25552 Pain in left hip: Secondary | ICD-10-CM | POA: Diagnosis not present

## 2015-08-22 DIAGNOSIS — Z7409 Other reduced mobility: Secondary | ICD-10-CM | POA: Diagnosis not present

## 2015-08-22 DIAGNOSIS — R531 Weakness: Secondary | ICD-10-CM

## 2015-08-22 DIAGNOSIS — M2560 Stiffness of unspecified joint, not elsewhere classified: Secondary | ICD-10-CM

## 2015-08-22 NOTE — Therapy (Signed)
[facility] [address] [place], [place], [address] Phone: [phone]   Fax:  [phone]  Physical Therapy Evaluation  Patient Details  Name: Sarah Stevenson MRN: [id number] Date of Birth: Jun 24, 1956 Referring Provider: Steva Colder  Encounter Date: 08/22/2015      PT End of Session - 08/22/15 1824    Visit Number 1   Number of Visits 12   Date for PT Re-Evaluation 10/03/15   PT Start Time 1619   PT Stop Time 1712   PT Time Calculation (min) 53 min   Activity Tolerance Patient tolerated treatment well      Past Medical History  Diagnosis Date  . Diabetes mellitus   . Osteoarthritis   . Hyperlipidemia   . Murmur, cardiac     Past Surgical History  Procedure Laterality Date  . Knee surgery      left arthroscopy  . Nasal sinus surgery    . Cardiac catheterization Right 11/30/2014    Procedure: RIGHT/LEFT HEART CATH AND CORONARY ANGIOGRAPHY;  Surgeon: Belva Crome, MD;  Location: [facility] CATH LAB;  Service: Cardiovascular;  Laterality: Right;    There were no vitals filed for this visit.  Visit Diagnosis:  Pain, hip, left - Plan: PT plan of care cert/re-cert  Stiffness due to immobility - Plan: PT plan of care cert/re-cert  Decreased strength, endurance, and mobility - Plan: PT plan of care cert/re-cert      Subjective Assessment - 08/22/15 1625    Subjective Patient reports that Lt hip pain has intensified over the past 6 months with history of hip pain for the past 1 1/2 years.    Pertinent History Lt knee surgery  1996 with arthroscopic debridement   How long can you sit comfortably? 30 min    How long can you stand comfortably? 1 hr   How long can you walk comfortably? burning pain initially after 20-30 min of walking    Diagnostic tests xrays - arthritic changes bilat hips    Currently in Pain? Yes   Pain Score 6    Pain Location Hip   Pain Orientation Left   Pain Descriptors / Indicators Sharp    Pain Type Chronic pain   Pain Radiating Towards at times radiates down into Lt LE - mostly trochanter into the groin    Pain Onset More than a month ago   Pain Frequency Intermittent   Aggravating Factors  prolonged sitting; standing; wlaking; descending steps(goes sideways holding rail); lying on Lt side    Pain Relieving Factors lying on Rt side propping Lt LE on the pillow; ice; self massager             [facility] PT Assessment - 08/22/15 0001    Assessment   Medical Diagnosis Lt trochanteric bursitis    Referring Provider E Corey   Onset Date/Surgical Date 02/21/15   Hand Dominance Right   Next MD Visit 1/17   Prior Therapy massage therapy about 1 year ago for one visit only    Precautions   Precautions None   Balance Screen   Has the patient fallen in the past 6 months No   Has the patient had a decrease in activity level because of a fear of falling?  No   Is the patient reluctant to leave their home because of a fear of falling?  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    Observation/Other Assessments   Focus on Therapeutic Outcomes (FOTO)  46% limitation    Sensation   Additional Comments WFL's LE's per pt report   Posture/Postural Control   Posture Comments head forward; shoulders rounded and elevated; flexed forward at hips; weight shifted to Rt    AROM   Overall AROM Comments tight Lt hip throughout   Right Knee Extension -11   Left Knee Extension -5   Strength   Right/Left Hip --  5/5 bilat LE's except Lt hip ext 4+/5    Flexibility   Soft Tissue Assessment /Muscle Length --  tight hip flexors Lt    Hamstrings Lt 76 deg; Rt 88 deg    Quadriceps tight bilat    ITB very tight Lt    Piriformis very tight Lt    Palpation    Spinal mobility mild tightness and tenderness L5/4/3 with PA glides    Palpation comment significant tightness Lt psoas; piriformis; hip abductors; IT band Lt                    [facility] Adult PT Treatment/Exercise - 08/22/15 0001    Therapeutic Activites    Therapeutic Activities --  myofacial ball release    Exercises   Exercises --  3 part core supine 10 sec x 10   Knee/Hip Exercises: Stretches   Passive Hamstring Stretch 3 reps;30 seconds   ITB Stretch 3 reps;30 seconds   Piriformis Stretch 3 reps;30 seconds   Moist Heat Therapy   Number Minutes Moist Heat 15 Minutes   Moist Heat Location Hip  anterior in groin                 PT Education - 08/22/15 1700    Education provided Yes   Education Details HEP   Person(s) Educated Patient   Methods Explanation;Demonstration;Tactile cues;Verbal cues;Handout   Comprehension Verbalized understanding;Returned demonstration;Verbal cues required;Tactile cues required             PT Long Term Goals - 08/22/15 1830    PT LONG TERM GOAL #1   Title Improve ROM/mobility through Lt hip to equal or greater than Rt hip 10/03/15   Time 6   Period Weeks   Status New   PT LONG TERM GOAL #2   Title Improve standing tolerance to 1 hour for work 10/03/15   Time 6   Period Weeks   Status New   PT LONG TERM GOAL #3   Title Improve walking tolerance to 30-45 min 10/03/15   Time 6   Period Weeks   Status New   PT LONG TERM GOAL #4   Title Decrease pain to 0/10 to 2-3/10 for 50-80% of day 10/03/15   Time 6   Period Weeks   Status New   PT LONG TERM GOAL #5   Title Improve FOTO to </= 38% limitation 10/03/15   Time 6   Period Weeks   Status New               Plan - 08/22/15 1825    Clinical Impression Statement Patient presents with chronic, significant myofacila pain Lt hip. She has limited ROM/mobility; decreased functional strength and endurance; limited ADL's; muscular tightness to palpation and pain on a  daily basis. Patient will benefit form PT to  address symptoms as identified.    Pt will benefit from skilled therapeutic intervention in order to improve on the following deficits Postural dysfunction;Improper body mechanics;Decreased range of motion;Decreased mobility;Decreased strength;Pain;Decreased endurance;Decreased activity tolerance   Rehab Potential Good   PT Frequency 2x / week   PT Duration 6 weeks   PT Treatment/Interventions Patient/family education;Therapeutic exercise;Therapeutic activities;Manual techniques;Neuromuscular re-education;Dry needling;Cryotherapy;Electrical Stimulation;Moist Heat;ADLs/Self Care Home Management;Ultrasound   PT Next Visit Plan manual therapy Lt piriformis and hip abductors into the IT band and hip flexors; strentching; modalities as indicated; possibly consider TDN   PT Home Exercise Plan myofacial ball work; stretching; 3 part core; TENS unit    Consulted and Agree with Plan of Care Patient         Problem List Patient Active Problem List   Diagnosis Date Noted  . Trochanteric bursitis of left hip 08/14/2015  . Arthritis of left hip 08/14/2015  . Excessive daytime sleepiness 03/03/2015  . Snoring 12/20/2014  . CAD (coronary artery disease), native coronary artery 12/19/2014  . Diabetes mellitus type 2, noninsulin dependent ([place])   . Hyperlipidemia LDL goal <70   . Murmur, cardiac   . NSTEMI (non-ST elevated myocardial infarction) ([place]) 11/30/2014    Alle Difabio Nilda Simmer PT, MPH  08/22/2015, 6:35 PM  [facility] [place] [place], [place], [address] Phone: [phone]   Fax:  [phone]  Name: SHABRIA EGLEY MRN: [id number] Date of Birth: [birth year]

## 2015-08-22 NOTE — Patient Instructions (Signed)
Abdominal Bracing With Pelvic Floor (Hook-Lying)    With neutral spine, tighten pelvic floor and abdominals sucking belly button to bone; tighten muscles in back at waist.  Hold 10 sec  Repeat _10__ times. Do _several __ times a day.   Piriformis Stretch   Place Lt foot across right tight - pull Lt knee across body.  Lying on back, pull right knee toward opposite shoulder. Hold __30__ seconds. Repeat __3__ times. Do __3-4__ sessions per day.  HIP: Hamstrings - Supine    Place strap around foot. Raise leg up, keep knee straight. Hold __30_ seconds. _3__ reps per set, _3__ sets per day   Outer Hip Stretch: Reclined IT Band Stretch (Strap)    Strap around opposite foot, pull across only as far as possible with shoulders on mat. Hold for _30 sec Repeat __3__ times each leg.

## 2015-09-18 MED FILL — EFFIENT 10 MG TABLET: 10 | 30 days supply | Qty: 30 | Fill #9

## 2015-09-18 MED FILL — CARVEDILOL 3.125 MG TABLET: 3.125 | 30 days supply | Qty: 60 | Fill #8

## 2015-09-18 MED FILL — LOSARTAN POTASSIUM 50 MG TA: 50 | 90 days supply | Qty: 90 | Fill #1

## 2015-09-25 ENCOUNTER — Ambulatory Visit: Payer: 59 | Admitting: Family Medicine

## 2015-10-03 MED FILL — METFORMIN HCL ER 750 MG TAB: 750 | 90 days supply | Qty: 180 | Fill #3

## 2015-10-11 ENCOUNTER — Telehealth: Payer: Self-pay | Admitting: Family Medicine

## 2015-10-11 DIAGNOSIS — M7072 Other bursitis of hip, left hip: Secondary | ICD-10-CM

## 2015-10-11 NOTE — Telephone Encounter (Signed)
Pt requests repeat referral to PT

## 2015-10-14 ENCOUNTER — Encounter: Payer: Self-pay | Admitting: Rehabilitative and Restorative Service Providers"

## 2015-10-14 ENCOUNTER — Ambulatory Visit (INDEPENDENT_AMBULATORY_CARE_PROVIDER_SITE_OTHER): Payer: 59 | Admitting: Rehabilitative and Restorative Service Providers"

## 2015-10-14 DIAGNOSIS — Z7409 Other reduced mobility: Secondary | ICD-10-CM

## 2015-10-14 DIAGNOSIS — R6889 Other general symptoms and signs: Secondary | ICD-10-CM

## 2015-10-14 DIAGNOSIS — M623 Immobility syndrome (paraplegic): Secondary | ICD-10-CM | POA: Diagnosis not present

## 2015-10-14 DIAGNOSIS — R531 Weakness: Secondary | ICD-10-CM

## 2015-10-14 DIAGNOSIS — M2560 Stiffness of unspecified joint, not elsewhere classified: Secondary | ICD-10-CM

## 2015-10-14 DIAGNOSIS — M25552 Pain in left hip: Secondary | ICD-10-CM

## 2015-10-14 NOTE — Therapy (Signed)
[facility] [place] [place], [place], [address] Phone: [phone]   Fax:  [phone]  Physical Therapy Treatment  Patient Details  Name: Sarah Stevenson MRN: [id number] Date of Birth: December 17, 1955 Referring Provider: Georgina Snell  Encounter Date: 10/14/2015      PT End of Session - 10/14/15 1630    Visit Number 2   Number of Visits 12   Date for PT Re-Evaluation 11/25/15  re-eval 10/14/15 with revised POC   PT Start Time 1528   PT Stop Time 1630   PT Time Calculation (min) 62 min   Activity Tolerance Patient tolerated treatment well      Past Medical History  Diagnosis Date  . Diabetes mellitus   . Osteoarthritis   . Hyperlipidemia   . Murmur, cardiac     Past Surgical History  Procedure Laterality Date  . Knee surgery      left arthroscopy  . Nasal sinus surgery    . Cardiac catheterization Right 11/30/2014    Procedure: RIGHT/LEFT HEART CATH AND CORONARY ANGIOGRAPHY;  Surgeon: Belva Crome, MD;  Location: [facility] CATH LAB;  Service: Cardiovascular;  Laterality: Right;    There were no vitals filed for this visit.  Visit Diagnosis:  Pain, hip, left - Plan: PT plan of care cert/re-cert  Stiffness due to immobility - Plan: PT plan of care cert/re-cert  Decreased strength, endurance, and mobility - Plan: PT plan of care cert/re-cert      Subjective Assessment - 10/14/15 1528    Subjective Patient reported some improvement following initial visit but had a flare up for symptoms last week with severe pain and difficulty walking and moving. She treatemt the pain with meds and self massage. Symptoms are a little better yesterday and today. Returns to work tomorrow after having been off since Thursday 10/10/15.    How long can you sit comfortably? 30 min    How long can you stand comfortably? 20-30 min    How long can you walk comfortably? burning pain initially after 20-30 min of walking    Diagnostic tests xrays -  arthritic changes bilat hips    Currently in Pain? Yes   Pain Score 2    Pain Location Hip   Pain Orientation Left   Pain Descriptors / Indicators Sharp   Pain Type Chronic pain   Pain Onset More than a month ago   Pain Frequency Constant  constant ache - sometimes shooting pain in the Lt hip             [facility] PT Assessment - 10/14/15 0001    Assessment   Medical Diagnosis Lt trochanteric bursitis    Referring Provider Georgina Snell   Onset Date/Surgical Date 02/21/15   Hand Dominance Right   Next MD Visit 3/17   Prior Therapy massage therapy about 1 year ago for one visit only    Precautions   Precautions None   Home Environment   Additional Comments single level home 2 steps into home    Prior Function   Level of Independence Independent   Vocation Full time employment   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    Leisure household chores/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    Observation/Other Assessments   Focus on Therapeutic Outcomes (FOTO)  51% limitation    Sensation   Additional Comments WFL's LE's per pt report   Posture/Postural Control   Posture Comments head forward; shoulders rounded and elevated; flexed forward at  hips; weight shifted to Rt    AROM   Overall AROM Comments tight Lt hip throughout   Right Knee Extension -9   Left Knee Extension -5   Strength   Right/Left Hip --  5/5 except 4+/5 Lt hip extension    Flexibility   Soft Tissue Assessment /Muscle Length --  tight hip flexors Lt    Hamstrings Lt 81 deg; Rt 88 deg    Quadriceps tight bilat    ITB very tight Lt    Piriformis very tight Lt    Palpation   Spinal mobility mild tightness and tenderness L5/4/3 with PA glides    Palpation comment significant tightness Lt psoas; piriformis; hip abductors; IT band Lt    Ambulation/Gait   Gait Comments antalgic gait with Lt LE ER decreased WB phase on Lt LE                      [facility] Adult PT Treatment/Exercise - 10/14/15 0001     Therapeutic Activites    Therapeutic Activities --  myofacial ball release    Exercises   Exercises --  3 part core supine 10 sec x 10   Knee/Hip Exercises: Stretches   Passive Hamstring Stretch 3 reps;30 seconds   Quad Stretch 3 reps;30 seconds   Hip Flexor Stretch 3 reps;30 seconds  Pt assist to stretch quads; adductors; sartorisis   Knee/Hip Exercises: Prone   Other Prone Exercises glut sets 10 sec x 10    Moist Heat Therapy   Number Minutes Moist Heat 15 Minutes   Moist Heat Location Hip  anterior in groin    Electrical Stimulation   Electrical Stimulation Location hip adductors; hip flexors; hip abductors;  proximal hamstring    Electrical Stimulation Action IFC   Electrical Stimulation Parameters to tolerance   Electrical Stimulation Goals Pain;Tone   Manual Therapy   Manual therapy comments Pt prone and supine   Joint Mobilization lumbar spine PA glides    Soft tissue mobilization Lt QL/lumbar paraspinals; hip abductors/hamstring/adductors/flexors with TP/deep pressure    Myofascial Release  Lt lumbar spine    Passive ROM Lt hip rotation with pt prone                 PT Education - 10/14/15 1630    Education provided --   Education Details --   Person(s) Educated --   Methods --   Comprehension --             PT Long Term Goals - 10/14/15 1640    PT LONG TERM GOAL #1   Title Improve ROM/mobility through Lt hip to equal or greater than Rt hip 11/25/15   Time 6  re-eval with new goals 10/14/15   Period Weeks   Status Revised   PT LONG TERM GOAL #2   Title Improve standing tolerance to 1 hour for work 11/25/15   Time 6   Period Weeks   Status Revised   PT LONG TERM GOAL #3   Title Improve walking tolerance to 30-45 min 11/25/15   Time 6   Status Revised   PT LONG TERM GOAL #4   Title Decrease pain to 0/10 to 2-3/10 for 50-80% of day 11/25/15   Time 6   Period Weeks   Status Revised   PT LONG TERM GOAL #5   Title Improve FOTO to </= 38%  limitation 11/25/15   Time 6   Period Weeks   Status Revised  Plan - 10/14/15 1632    Clinical Impression Statement Pt continues to have chronic myofacal Lt hips pian following a fall on ice last year in which her Lt LT was flexed under her body and her Rt LE was extended in front of her body. She has limited ROM; decreased strength Lt LE; limited functional strength and endurance bilat LE's; antalgic gait; pain. Pt will benefit from PT to address problems as identified.    Pt will benefit from skilled therapeutic intervention in order to improve on the following deficits Postural dysfunction;Improper body mechanics;Decreased range of motion;Decreased mobility;Decreased strength;Pain;Decreased endurance;Decreased activity tolerance   Rehab Potential Good   PT Frequency 2x / week   PT Duration 6 weeks   PT Treatment/Interventions Patient/family education;Therapeutic exercise;Therapeutic activities;Manual techniques;Neuromuscular re-education;Dry needling;Cryotherapy;Electrical Stimulation;Moist Heat;ADLs/Self Care Home Management;Ultrasound   PT Next Visit Plan manual therapy Lt piriformis and hip abductors into the IT band and hip flexors; strentching; modalities as indicated; possibly consider TDN   PT Home Exercise Plan myofacial ball work; stretching; 3 part core; TENS unit    Consulted and Agree with Plan of Care Patient        Problem List Patient Active Problem List   Diagnosis Date Noted  . Trochanteric bursitis of left hip 08/14/2015  . Arthritis of left hip 08/14/2015  . Excessive daytime sleepiness 03/03/2015  . Snoring 12/20/2014  . CAD (coronary artery disease), native coronary artery 12/19/2014  . Diabetes mellitus type 2, noninsulin dependent ([place])   . Hyperlipidemia LDL goal <70   . Murmur, cardiac   . NSTEMI (non-ST elevated myocardial infarction) ([place]) 11/30/2014    Tim Wilhide Nilda Simmer PT, MPH  10/14/2015, 4:54 PM  [facility] [place], [place], [address] Phone: [phone]   Fax:  [phone]  Name: SHRIYA AKER MRN: [id number] Date of Birth: 27-Oct-1955

## 2015-10-14 NOTE — Patient Instructions (Signed)
Quads / HF, Supine   Lie near edge of bed, pull both knees up toward chest. Hold one knee as you drop the other leg off the edge of the bed.  Relax hanging knee/can bend knee back if indicated. Hold 30 seconds. Repeat 3 times per session. Do 2-3 sessions per day.    Quads / HF, Prone   Lie face down. Grasp one ankle with same-side hand. Use towel if needed to reach. Gently pull foot toward buttock.  Hold 30 seconds. Repeat 3 times per session. Do 2-3 sessions per day.  Gluteal Sets    Tighten buttocks while pressing pelvis to floor. Hold __10__ seconds. Repeat _10___ times per set. Do _1-2___ sets per session. Do _2-3___ sessions per day.

## 2015-10-16 ENCOUNTER — Ambulatory Visit (INDEPENDENT_AMBULATORY_CARE_PROVIDER_SITE_OTHER): Payer: 59 | Admitting: Physical Therapy

## 2015-10-16 DIAGNOSIS — M25552 Pain in left hip: Secondary | ICD-10-CM | POA: Diagnosis not present

## 2015-10-16 DIAGNOSIS — M2560 Stiffness of unspecified joint, not elsewhere classified: Secondary | ICD-10-CM

## 2015-10-16 DIAGNOSIS — Z7409 Other reduced mobility: Secondary | ICD-10-CM

## 2015-10-16 DIAGNOSIS — M623 Immobility syndrome (paraplegic): Secondary | ICD-10-CM

## 2015-10-16 DIAGNOSIS — R531 Weakness: Secondary | ICD-10-CM

## 2015-10-16 DIAGNOSIS — R6889 Other general symptoms and signs: Secondary | ICD-10-CM

## 2015-10-16 NOTE — Therapy (Signed)
[facility] [place], [place], [address] Phone: [phone]   Fax:  [phone]  Physical Therapy Treatment  Patient Details  Name: Sarah Stevenson MRN: [id number] Date of Birth: 11/17/1955 Referring Provider: Dr. Georgina Snell  Encounter Date: 10/16/2015      PT End of Session - 10/16/15 1520    Visit Number 3   Number of Visits 12   Date for PT Re-Evaluation 11/25/15   PT Start Time 1518   PT Stop Time 1624   PT Time Calculation (min) 66 min   Activity Tolerance Patient tolerated treatment well      Past Medical History  Diagnosis Date  . Diabetes mellitus   . Osteoarthritis   . Hyperlipidemia   . Murmur, cardiac     Past Surgical History  Procedure Laterality Date  . Knee surgery      left arthroscopy  . Nasal sinus surgery    . Cardiac catheterization Right 11/30/2014    Procedure: RIGHT/LEFT HEART CATH AND CORONARY ANGIOGRAPHY;  Surgeon: Belva Crome, MD;  Location: [facility] CATH LAB;  Service: Cardiovascular;  Laterality: Right;    There were no vitals filed for this visit.  Visit Diagnosis:  Pain, hip, left  Stiffness due to immobility  Decreased strength, endurance, and mobility      Subjective Assessment - 10/16/15 1525    Subjective Pt reports she had significant improvement since last session.  Was able to give 1 hr massage with minimal symptoms.     Currently in Pain? Yes   Pain Score 2    Pain Location Hip   Pain Orientation Left   Pain Descriptors / Indicators Sore            [facility] PT Assessment - 10/16/15 0001    Assessment   Medical Diagnosis Lt trochanteric bursitis    Referring Provider Dr. Georgina Snell   Onset Date/Surgical Date 02/21/15   Hand Dominance Right   Next MD Visit 3/17   Prior Therapy massage therapy about 1 year ago for one visit only            [facility] Adult PT Treatment/Exercise - 10/16/15 0001    Knee/Hip Exercises: Stretches   Passive Hamstring Stretch  Left;30 seconds;3 reps   Quad Stretch 2 reps;30 seconds;Left   Hip Flexor Stretch 1 rep;60 seconds;Left   Hip Flexor Stretch Limitations Lt leg off table.    ITB Stretch Left;30 seconds;3 reps   Knee/Hip Exercises: Aerobic   Nustep L3: 5 min    Modalities   Modalities Electrical Stimulation;Moist Heat;Ultrasound   Moist Heat Therapy   Number Minutes Moist Heat 15 Minutes   Moist Heat Location Hip  anterior in groin    Electrical Stimulation   Electrical Stimulation Location Lt anterior thigh, greater trochanter, ant hip, glute med.    Electrical Stimulation Action IFC   Electrical Stimulation Parameters to tolerance    Electrical Stimulation Goals Pain;Tone   Ultrasound   Ultrasound Location Lt lateral hip   Ultrasound Parameters 1.1w/cm2, 1.0 mHz, 100%, 8 min    Ultrasound Goals Pain  tightness   Manual Therapy   Soft tissue mobilization to Lt ITB, glutes, piriformis. TPR to Lt psoas.    Myofascial Release to Lt adductors, quad, hip rotators.    Passive ROM Lt hip rotation with pt prone.  Very guarded with attempts at hip adduction or ER/IR in supine.  PT Long Term Goals - 10/16/15 1616    PT LONG TERM GOAL #1   Title Improve ROM/mobility through Lt hip to equal or greater than Rt hip 11/25/15   Time 6   Period Weeks   Status On-going   PT LONG TERM GOAL #2   Title Improve standing tolerance to 1 hour for work 11/25/15   Time 6   Period Weeks   Status On-going   PT LONG TERM GOAL #3   Title Improve walking tolerance to 30-45 min 11/25/15   Time 6   Period Weeks   Status On-going   PT LONG TERM GOAL #4   Title Decrease pain to 0/10 to 2-3/10 for 50-80% of day 11/25/15   Time 6   Period Weeks   Status On-going   PT LONG TERM GOAL #5   Title Improve FOTO to </= 38% limitation 11/25/15   Time 6   Period Weeks   Status On-going               Plan - 10/16/15 1615    Clinical Impression Statement Pt had positive response to last  treatment.  Pt was point tender in Lt hip structures and very guarded with manual therapy.  No goals met, only 2nd visit.    Pt will benefit from skilled therapeutic intervention in order to improve on the following deficits Postural dysfunction;Improper body mechanics;Decreased range of motion;Decreased mobility;Decreased strength;Pain;Decreased endurance;Decreased activity tolerance   Rehab Potential Good   PT Frequency 2x / week   PT Duration 6 weeks   PT Treatment/Interventions Patient/family education;Therapeutic exercise;Therapeutic activities;Manual techniques;Neuromuscular re-education;Dry needling;Cryotherapy;Electrical Stimulation;Moist Heat;ADLs/Self Care Home Management;Ultrasound   PT Next Visit Plan manual therapy Lt piriformis and hip abductors into the IT band and hip flexors; stretching; modalities as indicated; possibly consider TDN   Consulted and Agree with Plan of Care Patient        Problem List Patient Active Problem List   Diagnosis Date Noted  . Trochanteric bursitis of left hip 08/14/2015  . Arthritis of left hip 08/14/2015  . Excessive daytime sleepiness 03/03/2015  . Snoring 12/20/2014  . CAD (coronary artery disease), native coronary artery 12/19/2014  . Diabetes mellitus type 2, noninsulin dependent ([place])   . Hyperlipidemia LDL goal <70   . Murmur, cardiac   . NSTEMI (non-ST elevated myocardial infarction) [facility]) 11/30/2014    Kerin Perna, PTA 10/16/2015 4:22 PM  [place], [place], [address] Phone: [phone]   Fax:  [phone]  Name: Sarah Stevenson MRN: [id number] Date of Birth: 10/09/1955

## 2015-10-17 MED FILL — ATORVASTATIN 20 MG TABLET: 20 | 90 days supply | Qty: 90 | Fill #1

## 2015-10-17 MED FILL — EFFIENT 10 MG TABLET: 10 | 30 days supply | Qty: 30 | Fill #10

## 2015-10-17 MED FILL — SERTRALINE HCL 50 MG TABLET: 50 | 90 days supply | Qty: 90 | Fill #1

## 2015-10-17 MED FILL — CARVEDILOL 3.125 MG TABLET: 3.125 | 30 days supply | Qty: 60 | Fill #9

## 2015-10-18 MED FILL — glipiZIDE XL 10 MG TB24: 10 | 30 days supply | Qty: 30 | Fill #0

## 2015-10-23 ENCOUNTER — Encounter: Payer: 59 | Admitting: Rehabilitative and Restorative Service Providers"

## 2015-10-25 ENCOUNTER — Encounter: Payer: 59 | Admitting: Rehabilitative and Restorative Service Providers"

## 2015-10-28 ENCOUNTER — Encounter: Payer: Self-pay | Admitting: Rehabilitative and Restorative Service Providers"

## 2015-10-28 ENCOUNTER — Ambulatory Visit (INDEPENDENT_AMBULATORY_CARE_PROVIDER_SITE_OTHER): Payer: 59 | Admitting: Rehabilitative and Restorative Service Providers"

## 2015-10-28 DIAGNOSIS — M25552 Pain in left hip: Secondary | ICD-10-CM | POA: Diagnosis not present

## 2015-10-28 DIAGNOSIS — R6889 Other general symptoms and signs: Secondary | ICD-10-CM

## 2015-10-28 DIAGNOSIS — M623 Immobility syndrome (paraplegic): Secondary | ICD-10-CM

## 2015-10-28 DIAGNOSIS — Z7409 Other reduced mobility: Secondary | ICD-10-CM

## 2015-10-28 DIAGNOSIS — M2560 Stiffness of unspecified joint, not elsewhere classified: Secondary | ICD-10-CM

## 2015-10-28 DIAGNOSIS — R531 Weakness: Secondary | ICD-10-CM

## 2015-10-28 NOTE — Patient Instructions (Signed)
Bridging    Slowly raise buttocks from floor, keeping stomach tight. Hold 5-10 sed Repeat __10__ times per set. Do _1-3___ sets per session. Do __1-2__ sessions per day.    Butterfly, Supine    Lie on back, feet together. Lower knees toward floor. Hold __30-60_ seconds. Repeat __3_ times per session. Do _2__ sessions per day.   Pelvic Press    Place hands under belly between navel and pubic bone, palms up. Feel pressure on hands. Increase pressure on hands by pressing pelvis down. This is NOT a pelvic tilt. Hold _10__ seconds. Relax. Repeat _10__ times.

## 2015-10-28 NOTE — Therapy (Signed)
[employer], [place], [address] Phone: [phone]   Fax:  [phone]  Physical Therapy Treatment  Patient Details  Name: Sarah Stevenson MRN: [id number] Date of Birth: [birth year] Referring Provider: Dr. Georgina Snell  Encounter Date: 10/28/2015      PT End of Session - 10/28/15 1517    Visit Number 4   Number of Visits 12   Date for PT Re-Evaluation 11/25/15   PT Start Time 1432   PT Stop Time 1529   PT Time Calculation (min) 57 min   Activity Tolerance Patient tolerated treatment well      Past Medical History  Diagnosis Date  . Diabetes mellitus   . Osteoarthritis   . Hyperlipidemia   . Murmur, cardiac     Past Surgical History  Procedure Laterality Date  . Knee surgery      left arthroscopy  . Nasal sinus surgery    . Cardiac catheterization Right 11/30/2014    Procedure: RIGHT/LEFT HEART CATH AND CORONARY ANGIOGRAPHY;  Surgeon: Belva Crome, MD;  Location: [facility] CATH LAB;  Service: Cardiovascular;  Laterality: Right;    There were no vitals filed for this visit.  Visit Diagnosis:  Pain, hip, left  Stiffness due to immobility  Decreased strength, endurance, and mobility      Subjective Assessment - 10/28/15 1438    Subjective Patient reports that she has been dealing with the flu for the past week. She is improving but crossing Lt LE over the Rt "kills her" wen she is stretching. She can now step out of the shower and put weight on the Lt LE and feel like the leg will give away. She is about 70% better. Pleased with progress.    Currently in Pain? Yes   Pain Score 1    Pain Location Hip   Pain Orientation Left   Pain Descriptors / Indicators Sore   Pain Type Chronic pain   Pain Onset More than a month ago   Pain Frequency Intermittent   Aggravating Factors  prolonged sitting/standing/walking; descending steps; lying on the Lt side    Pain Relieving Factors lying on the Rt side;  propping Lt LE on pillow; self massage                          [facility] Adult PT Treatment/Exercise - 10/28/15 0001    Knee/Hip Exercises: Stretches   Passive Hamstring Stretch Left;30 seconds;3 reps   Hip Flexor Stretch 1 rep;60 seconds;Left   Hip Flexor Stretch Limitations Lt leg off table.    ITB Stretch --  hold due to incresaed adductor/groin pain    Other Knee/Hip Stretches adductor stretch  with belt 30 sec x 3    Other Knee/Hip Stretches frog leg stretch 30 sec x 2    Knee/Hip Exercises: Aerobic   Nustep L5: 5 min    Knee/Hip Exercises: Supine   Bridges Limitations 10 sec x 10 reps    Knee/Hip Exercises: Prone   Other Prone Exercises glut sets 10 sec x 10; pelvic press 10 sec x 5 reps    Modalities   Modalities Electrical Stimulation;Moist Heat;Ultrasound   Moist Heat Therapy   Number Minutes Moist Heat 15 Minutes   Moist Heat Location Hip  anterior in groin    Electrical Stimulation   Electrical Stimulation Location Lt anterior thigh, greater trochanter, ant hip, glute med.    Electrical Stimulation Action IFC  Electrical Stimulation Parameters to tolerance   Electrical Stimulation Goals Pain;Tone   Manual Therapy   Manual therapy comments Pt prone and supine   Joint Mobilization lumbar spine PA glides    Soft tissue mobilization to Lt ITB, glutes, piriformis. TPR to Lt psoas.    Myofascial Release to Lt adductors, quad, hip rotators.    Passive ROM Lt hip rotation with pt prone.                  PT Education - 10/28/15 1500    Education provided Yes   Education Details ball release; HEP    Person(s) Educated Patient   Methods Explanation;Demonstration;Tactile cues;Verbal cues;Handout   Comprehension Verbalized understanding;Returned demonstration;Verbal cues required;Tactile cues required             PT Long Term Goals - 10/28/15 1518    PT LONG TERM GOAL #1   Title Improve ROM/mobility through Lt hip to equal or greater than Rt  hip 11/25/15   Time 6   Period Weeks   Status On-going   PT LONG TERM GOAL #2   Title Improve standing tolerance to 1 hour for work 11/25/15   Time 6   Period Weeks   Status On-going   PT LONG TERM GOAL #3   Title Improve walking tolerance to 30-45 min 11/25/15   Time 6   Period Weeks   Status On-going   PT LONG TERM GOAL #4   Title Decrease pain to 0/10 to 2-3/10 for 50-80% of day 11/25/15   Time 6   Period Weeks   Status On-going   PT LONG TERM GOAL #5   Title Improve FOTO to </= 38% limitation 11/25/15   Time 6   Period Weeks   Status On-going               Plan - 10/28/15 1518    Clinical Impression Statement Progressing well with improving mobility and decreasing pain in the Lt hip. Progressing well toward stated goals of therapy.    Pt will benefit from skilled therapeutic intervention in order to improve on the following deficits Postural dysfunction;Improper body mechanics;Decreased range of motion;Decreased mobility;Decreased strength;Pain;Decreased endurance;Decreased activity tolerance   Rehab Potential Good   PT Frequency 2x / week   PT Duration 6 weeks   PT Treatment/Interventions Patient/family education;Therapeutic exercise;Therapeutic activities;Manual techniques;Neuromuscular re-education;Dry needling;Cryotherapy;Electrical Stimulation;Moist Heat;ADLs/Self Care Home Management;Ultrasound   PT Next Visit Plan manual therapy Lt piriformis and hip abductors into the IT band and hip flexors; stretching; modalities as indicated; possibly consider TDN   PT Home Exercise Plan myofacial ball work; stretching; 3 part core; TENS unit    Consulted and Agree with Plan of Care Patient        Problem List Patient Active Problem List   Diagnosis Date Noted  . Trochanteric bursitis of left hip 08/14/2015  . Arthritis of left hip 08/14/2015  . Excessive daytime sleepiness 03/03/2015  . Snoring 12/20/2014  . CAD (coronary artery disease), native coronary artery  12/19/2014  . Diabetes mellitus type 2, noninsulin dependent ([place])   . Hyperlipidemia LDL goal <70   . Murmur, cardiac   . NSTEMI (non-ST elevated myocardial infarction) ([place]) 11/30/2014    Ki Corbo P Shauntea Lok Pt, MPH  10/28/2015, 3:20 PM  [facility] [place], [place], [address] Phone: [phone]   Fax:  [phone]  Name: Sarah Stevenson MRN: [id number] Date of Birth: 05/13/1956

## 2015-10-31 ENCOUNTER — Encounter: Payer: Self-pay | Admitting: Rehabilitative and Restorative Service Providers"

## 2015-10-31 ENCOUNTER — Ambulatory Visit (INDEPENDENT_AMBULATORY_CARE_PROVIDER_SITE_OTHER): Payer: 59 | Admitting: Rehabilitative and Restorative Service Providers"

## 2015-10-31 DIAGNOSIS — R531 Weakness: Secondary | ICD-10-CM

## 2015-10-31 DIAGNOSIS — Z7409 Other reduced mobility: Secondary | ICD-10-CM

## 2015-10-31 DIAGNOSIS — M25552 Pain in left hip: Secondary | ICD-10-CM

## 2015-10-31 DIAGNOSIS — M2560 Stiffness of unspecified joint, not elsewhere classified: Secondary | ICD-10-CM

## 2015-10-31 DIAGNOSIS — M623 Immobility syndrome (paraplegic): Secondary | ICD-10-CM

## 2015-10-31 DIAGNOSIS — R6889 Other general symptoms and signs: Secondary | ICD-10-CM

## 2015-10-31 NOTE — Therapy (Signed)
[facility] [place], [place], [address] Phone: [phone]   Fax:  [phone]  Physical Therapy Treatment  Patient Details  Name: Sarah Stevenson MRN: [id number] Date of Birth: Jun 16, 1956 Referring Provider: Dr. Georgina Snell  Encounter Date: 10/31/2015    Past Medical History  Diagnosis Date  . Diabetes mellitus   . Osteoarthritis   . Hyperlipidemia   . Murmur, cardiac     Past Surgical History  Procedure Laterality Date  . Knee surgery      left arthroscopy  . Nasal sinus surgery    . Cardiac catheterization Right 11/30/2014    Procedure: RIGHT/LEFT HEART CATH AND CORONARY ANGIOGRAPHY;  Surgeon: Belva Crome, MD;  Location: [facility] CATH LAB;  Service: Cardiovascular;  Laterality: Right;    There were no vitals filed for this visit.  Visit Diagnosis:  Pain, hip, left  Stiffness due to immobility  Decreased strength, endurance, and mobility      Subjective Assessment - 10/31/15 1537    Subjective Flare up of Lt hip pain after she stood from treatment Monday. Symptoms continued to incresae with work Tuesday and Wednesday. She has tightness and pain in the groin and hip adductors. Not sure why it is so much tighter and painful since monday. Has not taken mm relaxant but did ake tylenol  yesterday. Improvement in symptoms in hip following treatment today. Will    Currently in Pain? Yes   Pain Score 5    Pain Location Hip   Pain Orientation Left   Pain Descriptors / Indicators Sore   Pain Type Chronic pain   Pain Onset More than a month ago   Pain Frequency Intermittent                         [facility] Adult PT Treatment/Exercise - 10/31/15 0001    Exercises   Exercises --  3 art cpre supine 10 sec x 10   Knee/Hip Exercises: Stretches   Passive Hamstring Stretch Left;30 seconds;3 reps  painful in Rt groin area    Hip Flexor Stretch Left;4 reps;60 seconds   Hip Flexor Stretch Limitations  Lt leg off table.    Modalities   Modalities Electrical Stimulation;Moist Heat;Ultrasound   Moist Heat Therapy   Number Minutes Moist Heat 20 Minutes   Moist Heat Location Hip  anterior in groin    Electrical Stimulation   Electrical Stimulation Location Lt anterior thigh, greater trochanter, ant hip, glute med.    Electrical Stimulation Action IFC   Electrical Stimulation Parameters to tolerance   Electrical Stimulation Goals Pain;Tone   Ultrasound   Ultrasound Location Lt hip adductors/flexors   Ultrasound Parameters 1.1w/cm2; 1 mHz, 100%; 8 min    Ultrasound Goals Pain  tightness    Manual Therapy   Manual therapy comments Pt supine    Soft tissue mobilization working through the anterior hip through flexors and adductors as well as through ITband and lateral hip(briefly)                     PT Long Term Goals - 10/28/15 1518    PT LONG TERM GOAL #1   Title Improve ROM/mobility through Lt hip to equal or greater than Rt hip 11/25/15   Time 6   Period Weeks   Status On-going   PT LONG TERM GOAL #2   Title Improve standing tolerance to 1 hour for work 11/25/15   Time 6  Period Weeks   Status On-going   PT LONG TERM GOAL #3   Title Improve walking tolerance to 30-45 min 11/25/15   Time 6   Period Weeks   Status On-going   PT LONG TERM GOAL #4   Title Decrease pain to 0/10 to 2-3/10 for 50-80% of day 11/25/15   Time 6   Period Weeks   Status On-going   PT LONG TERM GOAL #5   Title Improve FOTO to </= 38% limitation 11/25/15   Time 6   Period Weeks   Status On-going               Plan - 10/31/15 1743    Clinical Impression Statement Flare up of symptoms following treatment and with return to work in the past three days. She has significant increasd muscular tightness and pain in the Lt hip flexors and adductors and some tightness into the hip abductors/piriformis/IT Band./gluts. Patient may benefit form trial of TDN. Discussed this with patient and  she is willing to try TDN.    Pt will benefit from skilled therapeutic intervention in order to improve on the following deficits Postural dysfunction;Improper body mechanics;Decreased range of motion;Decreased mobility;Decreased strength;Pain;Decreased endurance;Decreased activity tolerance   Rehab Potential Good   PT Frequency 2x / week   PT Duration 6 weeks   PT Treatment/Interventions Patient/family education;Therapeutic exercise;Therapeutic activities;Manual techniques;Neuromuscular re-education;Dry needling;Cryotherapy;Electrical Stimulation;Moist Heat;ADLs/Self Care Home Management;Ultrasound   PT Next Visit Plan manual therapy Lt piriformis and hip abductors into the IT band and hip flexors; stretching; modalities as indicated; trial of TDN as schedule allows   PT Home Exercise Plan myofacial ball work; stretching; 3 part core; TENS unit    Consulted and Agree with Plan of Care Patient        Problem List Patient Active Problem List   Diagnosis Date Noted  . Trochanteric bursitis of left hip 08/14/2015  . Arthritis of left hip 08/14/2015  . Excessive daytime sleepiness 03/03/2015  . Snoring 12/20/2014  . CAD (coronary artery disease), native coronary artery 12/19/2014  . Diabetes mellitus type 2, noninsulin dependent ([place])   . Hyperlipidemia LDL goal <70   . Murmur, cardiac   . NSTEMI (non-ST elevated myocardial infarction) ([place]) 11/30/2014    Morrison Masser Nilda Simmer PT, MPH  10/31/2015, 5:49 PM  [facility] [address] [place] [place], [place], [address] Phone: [phone]   Fax:  [phone]  Name: Sarah Stevenson MRN: [id number] Date of Birth: 21-Jun-1956

## 2015-11-15 MED FILL — JANUVIA 100 MG TABLET: 100 | 90 days supply | Qty: 90 | Fill #2

## 2015-11-15 MED FILL — CARVEDILOL 3.125 MG TABLET: 3.125 | 30 days supply | Qty: 60 | Fill #10

## 2015-11-21 MED FILL — CELECOXIB 200 MG CAPSULE: 200 | 90 days supply | Qty: 90 | Fill #0

## 2015-11-29 MED FILL — EFFIENT 10 MG TABLET: 10 | 30 days supply | Qty: 30 | Fill #11

## 2015-12-02 MED FILL — glipiZIDE XL 10 MG TB24: 10 | 30 days supply | Qty: 30 | Fill #0

## 2015-12-12 ENCOUNTER — Encounter: Payer: Self-pay | Admitting: Family Medicine

## 2015-12-12 ENCOUNTER — Ambulatory Visit (INDEPENDENT_AMBULATORY_CARE_PROVIDER_SITE_OTHER): Payer: 59 | Admitting: Family Medicine

## 2015-12-12 VITALS — BP 147/65 | HR 72 | Wt 215.0 lb

## 2015-12-12 DIAGNOSIS — M7062 Trochanteric bursitis, left hip: Secondary | ICD-10-CM | POA: Diagnosis not present

## 2015-12-12 MED ORDER — METHOCARBAMOL 500 MG PO TABS: 500.0000 mg | ORAL_TABLET | Freq: Three times a day (TID) | ORAL | Status: DC

## 2015-12-12 MED ORDER — OXYCODONE-ACETAMINOPHEN 5-325 MG PO TABS: 0.5000 | ORAL_TABLET | Freq: Three times a day (TID) | ORAL | Status: DC | PRN

## 2015-12-12 MED FILL — METHOCARBAMOL 500 MG TABLET: 500 | 30 days supply | Qty: 90 | Fill #0

## 2015-12-12 NOTE — Progress Notes (Signed)
Sarah Stevenson is a 60 y.o. female who presents to [place] today for follow-up left lateral hip pain. She was seen in December for left lateral hip pain thought to be due to trochanteric bursitis. She's had several sessions with physical therapy which have helped. She notes some improvement in pain. She has continued left lateral hip pain and low back pain. No pain radiating beyond the knee. No weakness or numbness or loss of function. Pain is worse with activity and better with rest. Pain limits his ability to work.   Past Medical History  Diagnosis Date  . Diabetes mellitus   . Osteoarthritis   . Hyperlipidemia   . Murmur, cardiac    Past Surgical History  Procedure Laterality Date  . Knee surgery      left arthroscopy  . Nasal sinus surgery    . Cardiac catheterization Right 11/30/2014    Procedure: RIGHT/LEFT HEART CATH AND CORONARY ANGIOGRAPHY;  Surgeon: Belva Crome, MD;  Location: [facility] CATH LAB;  Service: Cardiovascular;  Laterality: Right;   Social History  Substance Use Topics  . Smoking status: Former Research scientist (life sciences)  . Smokeless tobacco: Not on file  . Alcohol Use: No   family history includes Heart attack in her father and mother; Sudden death in her father. There is no history of Hypertension, Hyperlipidemia, or Diabetes.  ROS as above Medications: Current Outpatient Prescriptions  Medication Sig Dispense Refill  . Acetaminophen (TYLENOL ARTHRITIS PAIN PO) Take 650 mg by mouth daily as needed. For rash    . ALPRAZolam (XANAX) 0.5 MG tablet Take 0.5 mg by mouth at bedtime as needed for anxiety.    Marland Kitchen atorvastatin (LIPITOR) 20 MG tablet Take 20 mg by mouth daily.    . carvedilol (COREG) 3.125 MG tablet Take 1 tablet (3.125 mg total) by mouth 2 (two) times daily with a meal. 60 tablet 11  . celecoxib (CELEBREX) 100 MG capsule Take 100 mg by mouth 2 (two) times daily.      . cetirizine (ZYRTEC) 10 MG tablet Take 10 mg by mouth daily.    . fluticasone (FLONASE) 50 MCG/ACT nasal spray Place 1 spray into both nostrils daily. For allergies    . glipiZIDE (GLUCOTROL) 5 MG tablet Take 10 mg by mouth daily before breakfast.     . KRILL OIL PO Take 1 tablet by mouth at bedtime and may repeat dose one time if needed.     Marland Kitchen losartan (COZAAR) 25 MG tablet Take 25 mg by mouth daily.     Marland Kitchen METFORMIN HCL PO Take 1,500 mg by mouth daily.     . methocarbamol (ROBAXIN) 500 MG tablet Take 1 tablet (500 mg total) by mouth 3 (three) times daily. 90 tablet 0  . Multiple Vitamin (MULTIVITAMIN) capsule Take 1 capsule by mouth daily.    . nitroGLYCERIN (NITROSTAT) 0.4 MG SL tablet Place 1 tablet (0.4 mg total) under the tongue every 5 (five) minutes as needed for chest pain. 25 tablet 3  . prasugrel (EFFIENT) 10 MG TABS tablet Take 1 tablet (10 mg total) by mouth daily. 30 tablet 11  . Ranitidine HCl (ZANTAC PO) Take 150 mg by mouth.    . sitaGLIPtin (JANUVIA) 100 MG tablet Take 100 mg by mouth daily.    . Thiamine HCl (VITAMIN B-1 PO) Take by mouth.    . oxyCODONE-acetaminophen (ROXICET) 5-325 MG tablet Take 0.5 tablets by mouth every 8 (eight) hours as needed  for severe pain. 15 tablet 0   No current facility-administered medications for this visit.   Allergies  Allergen Reactions  . Asa [Aspirin] Rash  . Blue Dyes (Parenteral)     rash  . Corn-Containing Products     hives  . Nsaids     rash  . Sugar-Protein-Starch     hives  . Tea     hives     Exam:  BP 147/65 mmHg  Pulse 72  Wt 215 lb (97.523 kg) Gen: Well NAD MSK: Nontender to spinal midline. Tender palpation left quadratus lumborum. Normal back motion. Left hip normal-appearing normal motion but pain in the groin with internal rotation. Tender palpation greater trochanteric area. Normal gait.  No results found for this or any previous visit (from the past 24 hour(s)). No results found.   Please see  individual assessment and plan sections.

## 2015-12-12 NOTE — Assessment & Plan Note (Signed)
Majority of pain is due to trochanteric bursitis. Repeat physical therapy. Focus on iontophoresis and dry needling as well as strength and motion. Recheck in one month if not better we will do corticosteroid injection.

## 2015-12-12 NOTE — Patient Instructions (Signed)
Thank you for coming in today. Return in 1 month.  Re try PT.

## 2015-12-13 ENCOUNTER — Encounter (HOSPITAL_BASED_OUTPATIENT_CLINIC_OR_DEPARTMENT_OTHER): Payer: Self-pay | Admitting: Emergency Medicine

## 2015-12-13 ENCOUNTER — Ambulatory Visit (HOSPITAL_BASED_OUTPATIENT_CLINIC_OR_DEPARTMENT_OTHER)
Admission: EM | Admit: 2015-12-13 | Discharge: 2015-12-14 | Disposition: A | Discharge status: Home / Self Care | Payer: 59 | Attending: Interventional Cardiology | Admitting: Interventional Cardiology

## 2015-12-13 ENCOUNTER — Emergency Department (HOSPITAL_BASED_OUTPATIENT_CLINIC_OR_DEPARTMENT_OTHER): Payer: 59

## 2015-12-13 ENCOUNTER — Encounter (HOSPITAL_COMMUNITY)
Admission: EM | Disposition: A | Discharge status: Home / Self Care | Payer: Self-pay | Source: Home / Self Care | Attending: Emergency Medicine

## 2015-12-13 DIAGNOSIS — R079 Chest pain, unspecified: Secondary | ICD-10-CM | POA: Diagnosis not present

## 2015-12-13 DIAGNOSIS — Z7902 Long term (current) use of antithrombotics/antiplatelets: Secondary | ICD-10-CM | POA: Diagnosis not present

## 2015-12-13 DIAGNOSIS — Z79899 Other long term (current) drug therapy: Secondary | ICD-10-CM | POA: Diagnosis not present

## 2015-12-13 DIAGNOSIS — I200 Unstable angina: Secondary | ICD-10-CM | POA: Diagnosis not present

## 2015-12-13 DIAGNOSIS — R0789 Other chest pain: Secondary | ICD-10-CM | POA: Diagnosis not present

## 2015-12-13 DIAGNOSIS — E119 Type 2 diabetes mellitus without complications: Secondary | ICD-10-CM | POA: Insufficient documentation

## 2015-12-13 DIAGNOSIS — E785 Hyperlipidemia, unspecified: Secondary | ICD-10-CM | POA: Diagnosis not present

## 2015-12-13 DIAGNOSIS — E1159 Type 2 diabetes mellitus with other circulatory complications: Secondary | ICD-10-CM

## 2015-12-13 DIAGNOSIS — Z7951 Long term (current) use of inhaled steroids: Secondary | ICD-10-CM | POA: Diagnosis not present

## 2015-12-13 DIAGNOSIS — I25110 Atherosclerotic heart disease of native coronary artery with unstable angina pectoris: Secondary | ICD-10-CM | POA: Diagnosis not present

## 2015-12-13 DIAGNOSIS — M1990 Unspecified osteoarthritis, unspecified site: Secondary | ICD-10-CM | POA: Insufficient documentation

## 2015-12-13 DIAGNOSIS — E118 Type 2 diabetes mellitus with unspecified complications: Secondary | ICD-10-CM

## 2015-12-13 DIAGNOSIS — R0683 Snoring: Secondary | ICD-10-CM | POA: Diagnosis present

## 2015-12-13 DIAGNOSIS — Z87891 Personal history of nicotine dependence: Secondary | ICD-10-CM | POA: Diagnosis not present

## 2015-12-13 DIAGNOSIS — Z7984 Long term (current) use of oral hypoglycemic drugs: Secondary | ICD-10-CM | POA: Insufficient documentation

## 2015-12-13 DIAGNOSIS — I2510 Atherosclerotic heart disease of native coronary artery without angina pectoris: Secondary | ICD-10-CM | POA: Insufficient documentation

## 2015-12-13 DIAGNOSIS — Z955 Presence of coronary angioplasty implant and graft: Secondary | ICD-10-CM | POA: Diagnosis not present

## 2015-12-13 DIAGNOSIS — Z9861 Coronary angioplasty status: Secondary | ICD-10-CM

## 2015-12-13 DIAGNOSIS — R011 Cardiac murmur, unspecified: Secondary | ICD-10-CM | POA: Diagnosis present

## 2015-12-13 HISTORY — PX: CARDIAC CATHETERIZATION: SHX172

## 2015-12-13 LAB — CBC
HEMATOCRIT: 39.4 % (ref 36.0–46.0)
Hemoglobin: 13.4 g/dL (ref 12.0–15.0)
MCH: 32.1 pg (ref 26.0–34.0)
MCHC: 34 g/dL (ref 30.0–36.0)
MCV: 94.3 fL (ref 78.0–100.0)
PLATELETS: 241 10*3/uL (ref 150–400)
RBC: 4.18 MIL/uL (ref 3.87–5.11)
RDW: 12.5 % (ref 11.5–15.5)
WBC: 9 10*3/uL (ref 4.0–10.5)

## 2015-12-13 LAB — BASIC METABOLIC PANEL
ANION GAP: 11 (ref 5–15)
BUN: 22 mg/dL — AB (ref 6–20)
CHLORIDE: 101 mmol/L (ref 101–111)
CO2: 24 mmol/L (ref 22–32)
Calcium: 9.5 mg/dL (ref 8.9–10.3)
Creatinine, Ser: 1.04 mg/dL — ABNORMAL HIGH (ref 0.44–1.00)
GFR calc Af Amer: >60 mL/min (ref >60)
GFR calc non Af Amer: 58 mL/min — ABNORMAL LOW (ref >60)
GLUCOSE: 247 mg/dL — AB (ref 65–99)
POTASSIUM: 4.9 mmol/L (ref 3.5–5.1)
Sodium: 136 mmol/L (ref 135–145)

## 2015-12-13 LAB — GLUCOSE, CAPILLARY
Glucose-Capillary: 135 mg/dL — ABNORMAL HIGH (ref 65–99)
Glucose-Capillary: 170 mg/dL — ABNORMAL HIGH (ref 65–99)

## 2015-12-13 LAB — POCT ACTIVATED CLOTTING TIME: Activated Clotting Time: 260 seconds

## 2015-12-13 LAB — TROPONIN I: Troponin I: <0.03 ng/mL (ref <0.031)

## 2015-12-13 SURGERY — LEFT HEART CATH AND CORONARY ANGIOGRAPHY
Anesthesia: LOCAL

## 2015-12-13 MED ORDER — LIDOCAINE HCL (PF) 1 % IJ SOLN
INTRAMUSCULAR | Status: AC
  Filled 2015-12-13: qty 30

## 2015-12-13 MED ORDER — FENTANYL CITRATE (PF) 100 MCG/2ML IJ SOLN
INTRAMUSCULAR | Status: AC
  Filled 2015-12-13: qty 2

## 2015-12-13 MED ORDER — CARVEDILOL 3.125 MG PO TABS
3.1250 mg | ORAL_TABLET | Freq: Two times a day (BID) | ORAL | Status: DC
  Administered 2015-12-14: 3.125 mg via ORAL
  Filled 2015-12-13 (×2): qty 1

## 2015-12-13 MED ORDER — ONDANSETRON HCL 4 MG/2ML IJ SOLN: 4.0000 mg | Freq: Four times a day (QID) | INTRAMUSCULAR | Status: DC | PRN

## 2015-12-13 MED ORDER — GLIPIZIDE 10 MG PO TABS
10.0000 mg | ORAL_TABLET | Freq: Every day | ORAL | Status: DC
  Administered 2015-12-14: 10 mg via ORAL
  Filled 2015-12-13: qty 1

## 2015-12-13 MED ORDER — HEPARIN BOLUS VIA INFUSION
4000.0000 [IU] | Freq: Once | INTRAVENOUS | Status: AC
  Administered 2015-12-13: 4000 [IU] via INTRAVENOUS

## 2015-12-13 MED ORDER — NITROGLYCERIN 1 MG/10 ML FOR IR/CATH LAB
INTRA_ARTERIAL | Status: AC
  Filled 2015-12-13: qty 10

## 2015-12-13 MED ORDER — HEPARIN (PORCINE) IN NACL 100-0.45 UNIT/ML-% IJ SOLN
1000.0000 [IU]/h | INTRAMUSCULAR | Status: DC
  Filled 2015-12-13: qty 250

## 2015-12-13 MED ORDER — SODIUM CHLORIDE 0.9% FLUSH
3.0000 mL | Freq: Two times a day (BID) | INTRAVENOUS | Status: DC
Start: 2015-12-13 — End: 2015-12-13

## 2015-12-13 MED ORDER — ACETAMINOPHEN 325 MG PO TABS: 650.0000 mg | ORAL_TABLET | ORAL | Status: DC | PRN

## 2015-12-13 MED ORDER — LOSARTAN POTASSIUM 50 MG PO TABS
25.0000 mg | ORAL_TABLET | Freq: Every day | ORAL | Status: DC
  Administered 2015-12-13 – 2015-12-14 (×2): 25 mg via ORAL
  Filled 2015-12-13 (×2): qty 1

## 2015-12-13 MED ORDER — SODIUM CHLORIDE 0.9 % IV SOLN: INTRAVENOUS | Status: DC

## 2015-12-13 MED ORDER — SODIUM CHLORIDE 0.9% FLUSH: 3.0000 mL | INTRAVENOUS | Status: DC | PRN

## 2015-12-13 MED ORDER — SODIUM CHLORIDE 0.9 % IV SOLN: 250.0000 mL | INTRAVENOUS | Status: DC | PRN

## 2015-12-13 MED ORDER — HEPARIN SODIUM (PORCINE) 1000 UNIT/ML IJ SOLN
INTRAMUSCULAR | Status: DC | PRN
  Administered 2015-12-13: 5000 [IU] via INTRAVENOUS
  Administered 2015-12-13: 3000 [IU] via INTRAVENOUS

## 2015-12-13 MED ORDER — MIDAZOLAM HCL 2 MG/2ML IJ SOLN
INTRAMUSCULAR | Status: DC | PRN
  Administered 2015-12-13: 1 mg via INTRAVENOUS

## 2015-12-13 MED ORDER — IOPAMIDOL (ISOVUE-370) INJECTION 76%
INTRAVENOUS | Status: DC | PRN
  Administered 2015-12-13: 110 mL

## 2015-12-13 MED ORDER — MIDAZOLAM HCL 2 MG/2ML IJ SOLN
INTRAMUSCULAR | Status: AC
  Filled 2015-12-13: qty 2

## 2015-12-13 MED ORDER — IOPAMIDOL (ISOVUE-370) INJECTION 76%
INTRAVENOUS | Status: AC
  Filled 2015-12-13: qty 50

## 2015-12-13 MED ORDER — ALPRAZOLAM 0.5 MG PO TABS: 0.5000 mg | ORAL_TABLET | Freq: Every evening | ORAL | Status: DC | PRN

## 2015-12-13 MED ORDER — PRASUGREL HCL 10 MG PO TABS
10.0000 mg | ORAL_TABLET | Freq: Every day | ORAL | Status: DC
  Administered 2015-12-13: 10 mg via ORAL
  Filled 2015-12-13 (×2): qty 1

## 2015-12-13 MED ORDER — SODIUM CHLORIDE 0.9 % WEIGHT BASED INFUSION
3.0000 mL/kg/h | INTRAVENOUS | Status: AC
Start: 2015-12-13 — End: 2015-12-13
  Administered 2015-12-13: 19:00:00 3 mL/kg/h via INTRAVENOUS

## 2015-12-13 MED ORDER — OXYCODONE-ACETAMINOPHEN 5-325 MG PO TABS: 0.5000 | ORAL_TABLET | Freq: Three times a day (TID) | ORAL | Status: DC | PRN

## 2015-12-13 MED ORDER — HEPARIN (PORCINE) IN NACL 2-0.9 UNIT/ML-% IJ SOLN
INTRAMUSCULAR | Status: DC | PRN
  Administered 2015-12-13: 1000 mL

## 2015-12-13 MED ORDER — HEPARIN (PORCINE) IN NACL 2-0.9 UNIT/ML-% IJ SOLN
INTRAMUSCULAR | Status: AC
  Filled 2015-12-13: qty 1000

## 2015-12-13 MED ORDER — NITROGLYCERIN 0.4 MG SL SUBL: 0.4000 mg | SUBLINGUAL_TABLET | SUBLINGUAL | Status: DC | PRN

## 2015-12-13 MED ORDER — ATORVASTATIN CALCIUM 20 MG PO TABS
20.0000 mg | ORAL_TABLET | Freq: Every day | ORAL | Status: DC
  Administered 2015-12-13: 20 mg via ORAL
  Filled 2015-12-13 (×2): qty 1

## 2015-12-13 MED ORDER — SODIUM CHLORIDE 0.9% FLUSH
3.0000 mL | Freq: Two times a day (BID) | INTRAVENOUS | Status: DC
  Administered 2015-12-13: 3 mL via INTRAVENOUS

## 2015-12-13 MED ORDER — VERAPAMIL HCL 2.5 MG/ML IV SOLN
INTRAVENOUS | Status: AC
  Filled 2015-12-13: qty 2

## 2015-12-13 MED ORDER — HEPARIN SODIUM (PORCINE) 1000 UNIT/ML IJ SOLN
INTRAMUSCULAR | Status: AC
  Filled 2015-12-13: qty 1

## 2015-12-13 MED ORDER — NITROGLYCERIN 1 MG/10 ML FOR IR/CATH LAB
INTRA_ARTERIAL | Status: DC | PRN
  Administered 2015-12-13: 200 ug via INTRACORONARY

## 2015-12-13 MED ORDER — ADENOSINE (DIAGNOSTIC) 140MCG/KG/MIN
INTRAVENOUS | Status: DC | PRN
  Administered 2015-12-13: 140 ug/kg/min via INTRAVENOUS

## 2015-12-13 MED ORDER — VERAPAMIL HCL 2.5 MG/ML IV SOLN
INTRAVENOUS | Status: DC | PRN
  Administered 2015-12-13: 16:00:00 via INTRA_ARTERIAL

## 2015-12-13 MED ORDER — FENTANYL CITRATE (PF) 100 MCG/2ML IJ SOLN
INTRAMUSCULAR | Status: DC | PRN
  Administered 2015-12-13 (×2): 25 ug via INTRAVENOUS

## 2015-12-13 MED ORDER — ADENOSINE 12 MG/4ML IV SOLN
16.0000 mL | Freq: Once | INTRAVENOUS | Status: DC
  Filled 2015-12-13: qty 16

## 2015-12-13 MED ORDER — IOPAMIDOL (ISOVUE-370) INJECTION 76%
INTRAVENOUS | Status: AC
  Filled 2015-12-13: qty 100

## 2015-12-13 MED ORDER — MORPHINE SULFATE (PF) 4 MG/ML IV SOLN
4.0000 mg | Freq: Once | INTRAVENOUS | Status: AC
  Administered 2015-12-13: 4 mg via INTRAVENOUS
  Filled 2015-12-13: qty 1

## 2015-12-13 SURGICAL SUPPLY — 16 items
CATH INFINITI 5 FR JL3.5 (CATHETERS) ×1 IMPLANT
CATH INFINITI 5FR ANG PIGTAIL (CATHETERS) ×1 IMPLANT
CATH INFINITI JR4 5F (CATHETERS) ×1 IMPLANT
CATH MICROCATH NAVVUS (MICROCATHETER) IMPLANT
CATH VISTA GUIDE 6FR JR4 (CATHETERS) ×2 IMPLANT
DEVICE RAD COMP TR BAND LRG (VASCULAR PRODUCTS) ×1 IMPLANT
GLIDESHEATH SLEND A-KIT 6F 22G (SHEATH) ×1 IMPLANT
KIT ESSENTIALS PG (KITS) ×1 IMPLANT
KIT HEART LEFT (KITS) ×2 IMPLANT
MICROCATHETER NAVVUS (MICROCATHETER) ×2
PACK CARDIAC CATHETERIZATION (CUSTOM PROCEDURE TRAY) ×2 IMPLANT
SYR MEDRAD MARK V 150ML (SYRINGE) ×2 IMPLANT
TRANSDUCER W/STOPCOCK (MISCELLANEOUS) ×2 IMPLANT
TUBING CIL FLEX 10 FLL-RA (TUBING) ×2 IMPLANT
WIRE ASAHI PROWATER 180CM (WIRE) ×1 IMPLANT
WIRE SAFE-T 1.5MM-J .035X260CM (WIRE) ×1 IMPLANT

## 2015-12-13 NOTE — Progress Notes (Signed)
ANTICOAGULATION CONSULT NOTE - Initial Consult  Pharmacy Consult for heparin Indication: chest pain/ACS  Allergies  Allergen Reactions  . Asa [Aspirin] Rash  . Blue Dyes (Parenteral)     rash  . Corn-Containing Products     hives  . Nsaids     rash  . Sugar-Protein-Starch     hives  . Tea     hives    Patient Measurements: Height: 5' 2.99" (160 cm) IBW/kg (Calculated) : 52.38   Vital Signs: Temp: 97.7 F (36.5 C) (04/07 1002) Temp Source: Oral (04/07 1002) BP: 138/54 mmHg (04/07 1002)  Labs:  Recent Labs  12/13/15 1005  HGB 13.4  HCT 39.4  PLT 241  CREATININE 1.04*  TROPONINI <0.03    Estimated Creatinine Clearance: 64.7 mL/min (by C-G formula based on Cr of 1.04).   Medical History: Past Medical History  Diagnosis Date  . Diabetes mellitus   . Osteoarthritis   . Hyperlipidemia   . Murmur, cardiac    Assessment: 60 yo F at [facility] with chest pain.  Pharmacy consulted to dose heparin for ACS. Pt to transfer to [facility] for evaluation of CP/USA.  Allergic to asa but on effient (stent placed to RCA 3/16).  Wt 97.5 kg, CBC WNL. Creat nl.  No bleeding reported.   Goal of Therapy:  Heparin level 0.3-0.7 units/ml Monitor platelets by anticoagulation protocol: Yes   Plan:  Give 4000 units bolus x 1 Start heparin infusion at 1000 units/hr Check anti-Xa level in 6 hours and daily while on heparin Continue to monitor H&H and platelets  Eudelia Bunch, Pharm.D. [phone] 12/13/2015 11:40 AM

## 2015-12-13 NOTE — Progress Notes (Signed)
Received from [facility] via bed. VS obtained. Pt placed on cardiac monitor and confirmed with Central Telemetry. Assisted to BR to void. IV continues to infuse at 292.5 cc/hr as ordered. R radial TR Band in place.

## 2015-12-13 NOTE — ED Notes (Signed)
[facility] is transporting patient to [facility]-- room [phone]

## 2015-12-13 NOTE — H&P (Signed)
CARDIOLOGY H&P   Patient ID: LATOY LABRIOLA MRN: [id number] DOB/AGE: 60-01-1956 60 y.o.  Admit date: 12/13/2015  Primary Physician   Woody Seller, MD Primary Cardiologist: Dr. Tamala Julian   HPI: Ms. Sarah Stevenson is a 60 year old female with a past medical history of CAD, DM, HLD, and osteoarthritis. Last cath was in March 2016, she received DES to her RCA.  At that time her LAD had 50-70% narrowing.  She was started having dull,  intermittent left sided chest pain yesterday. She took SL Nitro and had relief of her symptoms. This morning when she woke up she had the same dull chest pain, but it was much more intense.  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 She did have associated diaphoresis, but no SOB. Denies nausea and vomiting.  She took a SL Nitro and had moderate relief of her symptoms.   She is active, works full time.  She denies having any recent chest pain or SOB with activity.    Past Medical History  Diagnosis Date  . Diabetes mellitus   . Osteoarthritis   . Hyperlipidemia   . Murmur, cardiac      Past Surgical History  Procedure Laterality Date  . Knee surgery      left arthroscopy  . Nasal sinus surgery    . Cardiac catheterization Right 11/30/2014    Procedure: RIGHT/LEFT HEART CATH AND CORONARY ANGIOGRAPHY;  Surgeon: Belva Crome, MD;  Location: [facility] CATH LAB;  Service: Cardiovascular;  Laterality: Right;    Allergies  Allergen Reactions  . Asa [Aspirin] Rash  . Blue Dyes (Parenteral)     rash  . Corn-Containing Products     hives  . Nsaids     rash  . Sugar-Protein-Starch     hives  . Tea     hives    I have reviewed the patient's current medications   . heparin       Prior to Admission medications   Medication Sig Start Date End Date Taking? Authorizing Provider  Acetaminophen (TYLENOL ARTHRITIS PAIN PO) Take 650 mg by mouth daily as needed. For  rash    Historical Provider, MD  ALPRAZolam Duanne Moron) 0.5 MG tablet Take 0.5 mg by mouth at bedtime as needed for anxiety.    Historical Provider, MD  atorvastatin (LIPITOR) 20 MG tablet Take 20 mg by mouth daily.    Historical Provider, MD  carvedilol (COREG) 3.125 MG tablet Take 1 tablet (3.125 mg total) by mouth 2 (two) times daily with a meal. 12/01/14   Evelene Croon Barrett, PA-C  celecoxib (CELEBREX) 100 MG capsule Take 100 mg by mouth 2 (two) times daily.    Historical Provider, MD  cetirizine (ZYRTEC) 10 MG tablet Take 10 mg by mouth daily.    Historical Provider, MD  fluticasone (FLONASE) 50 MCG/ACT nasal spray Place 1 spray into both nostrils daily. For allergies    Historical Provider, MD  glipiZIDE (GLUCOTROL) 5 MG tablet Take 10 mg by mouth daily before breakfast.     Historical Provider, MD  KRILL OIL PO Take 1 tablet by mouth at bedtime and may repeat dose one time if needed.     Historical Provider, MD  losartan (COZAAR) 25 MG tablet Take 25 mg by mouth daily.     Historical Provider, MD  METFORMIN HCL PO Take 1,500 mg by  mouth daily.     Historical Provider, MD  methocarbamol (ROBAXIN) 500 MG tablet Take 1 tablet (500 mg total) by mouth 3 (three) times daily. 12/12/15   Gregor Hams, MD  Multiple Vitamin (MULTIVITAMIN) capsule Take 1 capsule by mouth daily.    Historical Provider, MD  nitroGLYCERIN (NITROSTAT) 0.4 MG SL tablet Place 1 tablet (0.4 mg total) under the tongue every 5 (five) minutes as needed for chest pain. 12/01/14   Rhonda G Barrett, PA-C  oxyCODONE-acetaminophen (ROXICET) 5-325 MG tablet Take 0.5 tablets by mouth every 8 (eight) hours as needed for severe pain. 12/12/15   Gregor Hams, MD  prasugrel (EFFIENT) 10 MG TABS tablet Take 1 tablet (10 mg total) by mouth daily. 12/14/14   Belva Crome, MD  Ranitidine HCl (ZANTAC PO) Take 150 mg by mouth.    Historical Provider, MD  sitaGLIPtin (JANUVIA) 100 MG tablet Take 100 mg by mouth daily.    Historical Provider, MD  Thiamine HCl  (VITAMIN B-1 PO) Take by mouth.    Historical Provider, MD     Social History   Social History  . Marital Status: Divorced    Spouse Name: N/A  . Number of Children: N/A  . Years of Education: N/A   Occupational History  . Not on file.   Social History Main Topics  . Smoking status: Former Research scientist (life sciences)  . Smokeless tobacco: Not on file  . Alcohol Use: No  . Drug Use: No  . Sexual Activity: Not on file   Other Topics Concern  . Not on file   Social History Narrative    No family status information on file.   Family History  Problem Relation Age of Onset  . Heart attack Mother   . Heart attack Father   . Sudden death Father   . Hypertension Neg Hx   . Hyperlipidemia Neg Hx   . Diabetes Neg Hx      ROS:  Full 14 point review of systems complete and found to be negative unless listed above.  Physical Exam: Blood pressure 151/61, pulse 86, temperature 97.8 F (36.6 C), temperature source Oral, resp. rate 17, height 5' 2.99" (1.6 m), SpO2 92 %.  General: Well developed, well nourished, female in no acute distress Head: Eyes PERRLA, No xanthomas.   Normocephalic and atraumatic, oropharynx without edema or exudate. Dentition: Good Lungs: CTA Heart: HRRR S1 S2, no rub/gallop, 3/6 systolic murmur. Pulses are 2+ extrem.   Neck: No carotid bruits. No lymphadenopathy.  No JVD. Abdomen: Bowel sounds present, abdomen soft and non-tender without masses or hernias noted. Msk:  No spine or cva tenderness. No weakness, no joint deformities or effusions. Extremities: No clubbing or cyanosis. No edema.  Neuro: Alert and oriented X 3. No focal deficits noted. Psych:  Good affect, responds appropriately Skin: No rashes or lesions noted.  Labs:   Lab Results  Component Value Date   WBC 9.0 12/13/2015   HGB 13.4 12/13/2015   HCT 39.4 12/13/2015   MCV 94.3 12/13/2015   PLT 241 12/13/2015    Recent Labs Lab 12/13/15 1005  NA 136  K 4.9  CL 101  CO2 24  BUN 22*  CREATININE  1.04*  CALCIUM 9.5  GLUCOSE 247*    Recent Labs  12/13/15 1005  TROPONINI <0.03    B NATRIURETIC PEPTIDE  Date/Time Value Ref Range Status  12/14/2014 11:10 AM 36.2 0.0 - 100.0 pg/mL Final   Lab Results  Component Value Date  CHOL 121 12/01/2014   HDL 25* 12/01/2014   LDLCALC 72 12/01/2014   TRIG 121 12/01/2014   Lab Results  Component Value Date   DDIMER 0.42 12/14/2014   Echo: March 2016 - Left ventricle: The cavity size was normal. Wall thickness was increased in a pattern of mild LVH. Systolic function was normal. The estimated ejection fraction was in the range of 60% to 65%. Wall motion was normal; there were no regional wall motion abnormalities. Doppler parameters are consistent with abnormal left ventricular relaxation (grade 1 diastolic dysfunction). The E/e&' ratio is between 8-15, suggesting indeterminate LV filling pressure. - Aortic valve: Mildly calcified leaflets - cannot r/o bicuspid or functionally bicuspid valve. Moderate aortic stenosis - AVA 1.4 cm2, based on LVOT diameter of 2.0 cm. There was no significant regurgitation. Mean gradient (S): 23 mm Hg. Peak gradient (S): 43 mm Hg. Valve area (VTI): 1.75 cm^2. Valve area (Vmax): 1.41 cm^2. Valve area (Vmean): 1.56 cm^2. - Mitral valve: Calcified annulus. There was trivial regurgitation. - Left atrium: The atrium was at the upper limits of normal in size. - Inferior vena cava: The vessel was normal in size. The respirophasic diameter changes were in the normal range (= 50%), consistent with normal central venous pressure.  Impressions:  - LVEF 60-65%, mild LVH, diastolic dysfunction, indeterminate LV filling pressure, moderate aortic stenosis AVA 1.4 cm2, mean gradient of 23 mmHg.  ECG:  NSR  Radiology:  Dg Chest 2 View  12/13/2015  CLINICAL DATA:  Chest pain starting yesterday, repeat pain today, left shoulder pain EXAM: CHEST  2 VIEW COMPARISON:  12/14/2014  FINDINGS: Cardiomediastinal silhouette is stable. No acute infiltrate or pleural effusion. No pulmonary edema. Mild degenerative changes mid and lower thoracic spine. IMPRESSION: No active cardiopulmonary disease. Electronically Signed   By: Lahoma Crocker M.D.   On: 12/13/2015 10:59    ASSESSMENT AND PLAN:    Active Problems:   Chest pain  1. Chest pain: Patient is still having intermittent pain, and has history of CAD.  She has been compliant with her Effient post PCI in March 2016 to her RCA.  She will go for left heart cath today.  Her troponin is negative thus far.  EKG does not show significant elevation. Her cardiac risk factors include DM and HLD.  Her last LDL was 72.  She is on moderate dose statin.   We will obtain A1C as she has history of DM.   Signed: Arbutus Leas, NP 12/13/2015 2:08 PM Pager [phone]  Co-Sign MD  I have examined the patient and reviewed assessment and plan and discussed with patient.  Agree with above as stated.  Recurrent chest pain.  Risks and benefits of cath explained to the patient and she is agreeable.    Harveer Sadler S.

## 2015-12-13 NOTE — Progress Notes (Signed)
R radial TR band removed. Bruising noted at site. Level 1. Sat to R hand 96% . R hand warm with palpable radial pulse.

## 2015-12-13 NOTE — ED Provider Notes (Addendum)
CSN: [id number]     Arrival date & time 12/13/15  0944 History   First MD Initiated Contact with Patient 12/13/15 [phone]     Chief Complaint  Patient presents with  . Chest Pain      HPI Patient reports intermittent left sided chest discomfort since yesterday.  She states her pain is been waxing and waning.  She took nitroglycerin last night with some improvement in her symptoms.  Her symptoms persisted today and thus she came to the ER for evaluation.  She has a known history of coronary artery disease.  She had a stent placed to her RCA in March 2016.  She had 50-70% diseased her LAD which was considered nonobstructive at that time and was not intervened on.  Her circumflex was normal.  She has moderate aortic stenosis based on echocardiogram at that time.  She did do heavy yard work 4 days ago without chest discomfort.  She's never had discomfort or pain like this before.  Her pain comes for approximately 1 minute and then resolves.  She has some diaphoresis.  She denies shortness of breath.  She denies nausea vomiting or diarrhea.  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   Past Medical History  Diagnosis Date  . Diabetes mellitus   . Osteoarthritis   . Hyperlipidemia   . Murmur, cardiac    Past Surgical History  Procedure Laterality Date  . Knee surgery      left arthroscopy  . Nasal sinus surgery    . Cardiac catheterization Right 11/30/2014    Procedure: RIGHT/LEFT HEART CATH AND CORONARY ANGIOGRAPHY;  Surgeon: Belva Crome, MD;  Location: [facility] CATH LAB;  Service: Cardiovascular;  Laterality: Right;   Family History  Problem Relation Age of Onset  . Heart attack Mother   . Heart attack Father   . Sudden death Father   . Hypertension Neg Hx   . Hyperlipidemia Neg Hx   . Diabetes Neg Hx    Social History  Substance Use Topics  . Smoking  status: Former Research scientist (life sciences)  . Smokeless tobacco: None  . Alcohol Use: No   OB History    No data available     Review of Systems  All other systems reviewed and are negative.     Allergies  Asa; Blue dyes (parenteral); Corn-containing products; Nsaids; Sugar-protein-starch; and Tea  Home Medications   Prior to Admission medications   Medication Sig Start Date End Date Taking? Authorizing Provider  Acetaminophen (TYLENOL ARTHRITIS PAIN PO) Take 650 mg by mouth daily as needed. For rash    Historical Provider, MD  ALPRAZolam Duanne Moron) 0.5 MG tablet Take 0.5 mg by mouth at bedtime as needed for anxiety.    Historical Provider, MD  atorvastatin (LIPITOR) 20 MG tablet Take 20 mg by mouth daily.    Historical Provider, MD  carvedilol (COREG) 3.125 MG tablet Take 1 tablet (3.125 mg total) by mouth 2 (two) times daily with a meal. 12/01/14   Evelene Croon Barrett, PA-C  celecoxib (CELEBREX) 100 MG capsule Take 100 mg by mouth 2 (two) times daily.    Historical Provider, MD  cetirizine (ZYRTEC) 10 MG tablet Take 10 mg by mouth daily.    Historical Provider, MD  fluticasone (FLONASE) 50 MCG/ACT nasal spray Place 1 spray into both nostrils daily. For allergies    Historical Provider,  MD  glipiZIDE (GLUCOTROL) 5 MG tablet Take 10 mg by mouth daily before breakfast.     Historical Provider, MD  KRILL OIL PO Take 1 tablet by mouth at bedtime and may repeat dose one time if needed.     Historical Provider, MD  losartan (COZAAR) 25 MG tablet Take 25 mg by mouth daily.     Historical Provider, MD  METFORMIN HCL PO Take 1,500 mg by mouth daily.     Historical Provider, MD  methocarbamol (ROBAXIN) 500 MG tablet Take 1 tablet (500 mg total) by mouth 3 (three) times daily. 12/12/15   Gregor Hams, MD  Multiple Vitamin (MULTIVITAMIN) capsule Take 1 capsule by mouth daily.    Historical Provider, MD  nitroGLYCERIN (NITROSTAT) 0.4 MG SL tablet Place 1 tablet (0.4 mg total) under the tongue every 5 (five) minutes as  needed for chest pain. 12/01/14   Rhonda G Barrett, PA-C  oxyCODONE-acetaminophen (ROXICET) 5-325 MG tablet Take 0.5 tablets by mouth every 8 (eight) hours as needed for severe pain. 12/12/15   Gregor Hams, MD  prasugrel (EFFIENT) 10 MG TABS tablet Take 1 tablet (10 mg total) by mouth daily. 12/14/14   Belva Crome, MD  Ranitidine HCl (ZANTAC PO) Take 150 mg by mouth.    Historical Provider, MD  sitaGLIPtin (JANUVIA) 100 MG tablet Take 100 mg by mouth daily.    Historical Provider, MD  Thiamine HCl (VITAMIN B-1 PO) Take by mouth.    Historical Provider, MD   There were no vitals taken for this visit. Physical Exam  Constitutional: She is oriented to person, place, and time. She appears well-developed and well-nourished. No distress.  HENT:  Head: Normocephalic and atraumatic.  Eyes: EOM are normal.  Neck: Normal range of motion.  Cardiovascular: Normal rate and regular rhythm.   3/6 systolic ejection murmur  Pulmonary/Chest: Effort normal and breath sounds normal.  Abdominal: Soft. She exhibits no distension. There is no tenderness.  Musculoskeletal: Normal range of motion.  Neurological: She is alert and oriented to person, place, and time.  Skin: Skin is warm and dry.  Psychiatric: She has a normal mood and affect. Judgment normal.  Nursing note and vitals reviewed.   ED Course  Procedures (including critical care time)  CRITICAL CARE Performed by: Hoy Morn Total critical care time: 31 minutes Critical care time was exclusive of separately billable procedures and treating other patients. Critical care was necessary to treat or prevent imminent or life-threatening deterioration. Critical care was time spent personally by me on the following activities: development of treatment plan with patient and/or surrogate as well as nursing, discussions with consultants, evaluation of patient's response to treatment, examination of patient, obtaining history from patient or surrogate,  ordering and performing treatments and interventions, ordering and review of laboratory studies, ordering and review of radiographic studies, pulse oximetry and re-evaluation of patient's condition.   Labs Review Labs Reviewed  BASIC METABOLIC PANEL - Abnormal; Notable for the following:    Glucose, Bld 247 (*)    BUN 22 (*)    Creatinine, Ser 1.04 (*)    GFR calc non Af Amer 58 (*)    All other components within normal limits  CBC  TROPONIN I    Imaging Review Dg Chest 2 View  12/13/2015  CLINICAL DATA:  Chest pain starting yesterday, repeat pain today, left shoulder pain EXAM: CHEST  2 VIEW COMPARISON:  12/14/2014 FINDINGS: Cardiomediastinal silhouette is stable. No acute infiltrate or pleural effusion. No  pulmonary edema. Mild degenerative changes mid and lower thoracic spine. IMPRESSION: No active cardiopulmonary disease. Electronically Signed   By: Lahoma Crocker M.D.   On: 12/13/2015 10:59   I have personally reviewed and evaluated these images and lab results as part of my medical decision-making.  EKG Interpretation #1 Date/Time:  Friday December 13 2015 09:53:08 EDT Ventricular Rate:  71 PR Interval:  139 QRS Duration: 101 QT Interval:  408 QTC Calculation: 443 R Axis:   36 Text Interpretation:  Sinus rhythm No significant change was found  Confirmed by Keldan Eplin  MD, Lennette Bihari ([id number]) on 12/13/2015 9:56:39 AM Also  confirmed by Lavanda Nevels  MD, Lennette Bihari ([id number]), editor Gilford Rile, CCT, [place] ([id number])   on 12/13/2015 9:57:20 AM   ECG interpretation #2  Date: 12/13/2015  Rate: 71  Rhythm: normal sinus rhythm  QRS Axis: normal  Intervals: normal  ST/T Wave abnormalities: normal  Conduction Disutrbances: none  Narrative Interpretation:   Old EKG Reviewed: No significant changes noted as compared to earlier ecg         MDM   Final diagnoses:  Chest pain, unspecified chest pain type  Unstable angina ([place])   Waxing waning left-sided chest discomfort in a patient with known coronary  artery disease.  They're both typical and atypical components to her presentation today.  Patient will undergo labs, x-ray.  Currently EKG is without ischemic changes.   11:08 AM I spoke with Dr Irish Lack who Accepts the patient to a telemetry bed for evaluation of chest pain and possible unstable angina.  Patient will remain nothing by mouth as she may benefit from heart catheterization later today.  Her EKG is without ischemic changes.  She is allergic to aspirin but on effient. Will start on heparin at this time time. Will transfer to [facility].   12:44 PM Still with waxing and waning discomfort and pain.  It is transient and lasts less than a minute.  Repeat EKG is without ischemic changes.  Morphine given for pain.  Patient being transferred to [facility] cone of this time with [facility].    Jola Schmidt, MD 12/13/15 [place], MD 12/13/15 [place], MD 12/13/15 1245

## 2015-12-13 NOTE — ED Notes (Signed)
Left sided chest pain since yesterday am.  Took one nitroglycerin and it was relieved.  Pt states her pain came back this am, radiating to left shoulder.  Some diaphoresis.  No N/V/D

## 2015-12-13 NOTE — Interval H&P Note (Signed)
History and Physical Interval Note:  12/13/2015 4:00 PM  GARRY BOCHICCHIO  has presented today for surgery, with the diagnosis of cp- Unstable Angina.  The various methods of treatment have been discussed with the patient and family. After consideration of risks, benefits and other options for treatment, the patient has consented to  Procedure(s): Left Heart Cath and Coronary Angiography (N/A) as a surgical intervention .  The patient's history has been reviewed, patient examined, no change in status, stable for surgery.  I have reviewed the patient's chart and labs.  Questions were answered to the patient's satisfaction.    Cath Lab Visit (complete for each Cath Lab visit)  Clinical Evaluation Leading to the Procedure:   ACS: Yes.    Non-ACS:    Anginal Classification: CCS III  Anti-ischemic medical therapy: Minimal Therapy (1 class of medications)  Non-Invasive Test Results: No non-invasive testing performed  Prior CABG: No previous CABG  TIMI SCORE  Patient Information:  TIMI Score is 4  UA/NSTEMI and intermediate-risk features (e.g., TIMI score 3?4) for short-term risk of death or nonfatal MI  Revascularization of the presumed culprit artery   A (8)  Indication: 10; Score: 8    Brennan Litzinger W

## 2015-12-13 NOTE — ED Notes (Signed)
Pt reports pain relieved today by nitro.

## 2015-12-14 ENCOUNTER — Other Ambulatory Visit: Payer: Self-pay | Admitting: Physician Assistant

## 2015-12-14 DIAGNOSIS — Z79899 Other long term (current) drug therapy: Secondary | ICD-10-CM | POA: Diagnosis not present

## 2015-12-14 DIAGNOSIS — R079 Chest pain, unspecified: Secondary | ICD-10-CM

## 2015-12-14 DIAGNOSIS — Z7951 Long term (current) use of inhaled steroids: Secondary | ICD-10-CM | POA: Diagnosis not present

## 2015-12-14 DIAGNOSIS — I350 Nonrheumatic aortic (valve) stenosis: Secondary | ICD-10-CM

## 2015-12-14 DIAGNOSIS — Z955 Presence of coronary angioplasty implant and graft: Secondary | ICD-10-CM

## 2015-12-14 DIAGNOSIS — E119 Type 2 diabetes mellitus without complications: Secondary | ICD-10-CM | POA: Diagnosis not present

## 2015-12-14 DIAGNOSIS — Z7984 Long term (current) use of oral hypoglycemic drugs: Secondary | ICD-10-CM | POA: Diagnosis not present

## 2015-12-14 DIAGNOSIS — Z87891 Personal history of nicotine dependence: Secondary | ICD-10-CM | POA: Diagnosis not present

## 2015-12-14 DIAGNOSIS — M1990 Unspecified osteoarthritis, unspecified site: Secondary | ICD-10-CM | POA: Diagnosis not present

## 2015-12-14 DIAGNOSIS — E785 Hyperlipidemia, unspecified: Secondary | ICD-10-CM | POA: Diagnosis not present

## 2015-12-14 DIAGNOSIS — I25110 Atherosclerotic heart disease of native coronary artery with unstable angina pectoris: Secondary | ICD-10-CM | POA: Diagnosis not present

## 2015-12-14 LAB — GLUCOSE, CAPILLARY: GLUCOSE-CAPILLARY: 212 mg/dL — AB (ref 65–99)

## 2015-12-14 LAB — BASIC METABOLIC PANEL
ANION GAP: 11 (ref 5–15)
BUN: 15 mg/dL (ref 6–20)
CO2: 25 mmol/L (ref 22–32)
Calcium: 8.6 mg/dL — ABNORMAL LOW (ref 8.9–10.3)
Chloride: 105 mmol/L (ref 101–111)
Creatinine, Ser: 0.98 mg/dL (ref 0.44–1.00)
Glucose, Bld: 223 mg/dL — ABNORMAL HIGH (ref 65–99)
POTASSIUM: 4.2 mmol/L (ref 3.5–5.1)
SODIUM: 141 mmol/L (ref 135–145)

## 2015-12-14 MED ORDER — ISOSORBIDE MONONITRATE ER 30 MG PO TB24: 15.0000 mg | ORAL_TABLET | Freq: Every day | ORAL | Status: DC

## 2015-12-14 MED ORDER — ISOSORBIDE MONONITRATE ER 30 MG PO TB24
15.0000 mg | ORAL_TABLET | Freq: Every day | ORAL | Status: DC
  Administered 2015-12-14: 15 mg via ORAL
  Filled 2015-12-14: qty 1

## 2015-12-14 NOTE — Discharge Summary (Signed)
Discharge Summary    Patient ID: Sarah Stevenson,  MRN: [id number], DOB/AGE: [birth year] 60 y.o.  Admit date: 12/13/2015 Discharge date: 12/14/2015  Primary Care Provider: Woody Seller Primary Cardiologist: Dr. Tamala Julian  Discharge Diagnoses    Principal Problem:   Chest pain Active Problems:   Diabetes mellitus type 2, noninsulin dependent ([place])   Hyperlipidemia LDL goal <70   Murmur, cardiac   Snoring   CAD S/P percutaneous coronary angioplasty   Allergies Allergies  Allergen Reactions  . Asa [Aspirin] Rash  . Blue Dyes (Parenteral)     rash  . Corn-Containing Products     hives  . Nsaids     rash  . Sugar-Protein-Starch     hives  . Tea     hives    Diagnostic Studies/Procedures    Cardiac cath   Conclusion     Mid RCA lesion, 60% stenosed - distal to the stent. FFR 0.84.  Wiidely patent proximl RCA stent  Mid LAD lesion, 35% stenosed.  The left ventricular systolic function is normal.  Severe dx in small D2 not amenable to PCI   No obvious Culprit lesion found for unstable angina.concerrning the ossibility that the small diagonal branch is the lesion, however this is not PCI amenable.  Plan: Overnight Observation for medication titration. Consider adding Nitrate or Ranexa if CP persists.  Expected d/c in AM.    _____________   History of Present Illness    Sarah Stevenson is a 60 year old female with a past medical history of CAD, DM, HLD, and osteoarthritis. Last cath was in March 2016, she received DES to her RCA. At that time her LAD had 50-70% narrowing.  She was started having dull, intermittent left sided chest pain yesterday. She took SL Nitro and had relief of her symptoms. This morning when she woke up she had the same dull chest pain, but it was much more intense. 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 She did have associated  diaphoresis, but no SOB. Denies nausea and vomiting. She took a SL Nitro and had moderate relief of her symptoms.   She is active, works full time. She denies having any recent chest pain or SOB with activity.    Hospital Course     She underwent cardiac catheterization on 12/13/2015 which showed 60% mid RCA lesion distal to the previously placed RCA stent, FFR was no significant at 0.84, previous RCA stent is widely patent, 35% mid LAD lesion, she does have severe disease in a tiny second diagonal, however this is not PCI amenable.  Post-cath, she was kept in the hospital overnight for observation, she did have 3 episodes of transient chest pain, she says it lasted roughly 30-45 seconds each. Given her persistent symptoms, we have added 15 mg Imdur to her medical regiment. I have instructed the patient to monitor her blood pressure to make sure she does not have any hypotension. On physical exam this morning, however we also noted she has very significant 4/6 systolic aortic murmur. Upon review of the previous echocardiogram shows that she had moderate AS on her previous echo in early 2016 with concern of possible bicuspid valve. After discussing with M.D., we will obtain outpatient repeat echocardiogram in a few weeks. Otherwise patient is deemed stable for discharge from cardiology perspective. Outpatient follow-up has been previously arranged.  _____________  Discharge Vitals Blood pressure 105/43, pulse 68, temperature 97.6 F (36.4 C), temperature source Oral, resp. rate 20, height '5\' 3"'$  (1.6 m), weight 212 lb 15.4 oz (96.6 kg), SpO2 95 %.  Filed Weights   12/13/15 1950 12/14/15 0445  Weight: 213 lb 13.5 oz (97 kg) 212 lb 15.4 oz (96.6 kg)    Labs & Radiologic Studies     CBC  Recent Labs  12/13/15 1005  WBC 9.0  HGB 13.4  HCT 39.4  MCV 94.3  PLT 355   Basic Metabolic Panel  Recent Labs  12/13/15 1005 12/14/15 0601  NA 136 141  K 4.9 4.2  CL 101 105  CO2 24 25    GLUCOSE 247* 223*  BUN 22* 15  CREATININE 1.04* 0.98  CALCIUM 9.5 8.6*   Cardiac Enzymes  Recent Labs  12/13/15 1005  TROPONINI <0.03    Dg Chest 2 View  12/13/2015  CLINICAL DATA:  Chest pain starting yesterday, repeat pain today, left shoulder pain EXAM: CHEST  2 VIEW COMPARISON:  12/14/2014 FINDINGS: Cardiomediastinal silhouette is stable. No acute infiltrate or pleural effusion. No pulmonary edema. Mild degenerative changes mid and lower thoracic spine. IMPRESSION: No active cardiopulmonary disease. Electronically Signed   By: Lahoma Crocker M.D.   On: 12/13/2015 10:59    Disposition   Pt is being discharged home today in good condition.  Follow-up Plans & Appointments    Follow-up Information    Follow up with Truitt Merle, NP On 01/03/2016.   Specialties:  Nurse Practitioner, Interventional Cardiology, Cardiology, Radiology   Why:  8:00AM Cardiology followup   Contact information:   [place]. [address] [place] [address] [phone]       [facility] with [facility].   Specialty:  Cardiology   Why:  Office scheduler will contact you to arrange outpatient echocardiogram to reassess heart murmur   Contact information:   [address] [place] [phone]     Discharge Instructions    Diet - low sodium heart healthy    Complete by:  As directed      Increase activity slowly    Complete by:  As directed            Discharge Medications   Current Discharge Medication List    START taking these medications   Details  isosorbide mononitrate (IMDUR) 30 MG 24 hr tablet Take 0.5 tablets (15 mg total) by mouth daily. Qty: 30 tablet, Refills: 5      CONTINUE these medications which have NOT CHANGED   Details  Acetaminophen (TYLENOL ARTHRITIS PAIN PO) Take 650 mg by mouth daily as needed. For pain    ALPRAZolam (XANAX) 0.5 MG tablet Take 0.5 mg by mouth at bedtime as needed for anxiety.     atorvastatin (LIPITOR) 20 MG tablet Take 20 mg by mouth daily.    carvedilol (COREG) 3.125 MG tablet Take 1 tablet (3.125 mg total) by mouth 2 (two) times daily with a meal. Qty: 60 tablet, Refills: 11    celecoxib (CELEBREX) 100 MG capsule Take 100 mg by mouth daily.     cetirizine (ZYRTEC) 10 MG tablet Take 10 mg by mouth daily.    fluticasone (FLONASE) 50 MCG/ACT nasal spray Place 1 spray into both nostrils daily as needed for allergies. For allergies    glipiZIDE (GLUCOTROL) 5 MG tablet Take 10 mg by mouth daily before breakfast.     losartan (COZAAR)  25 MG tablet Take 25 mg by mouth daily.     METFORMIN HCL PO Take 1,500 mg by mouth daily.     methocarbamol (ROBAXIN) 500 MG tablet Take 1 tablet (500 mg total) by mouth 3 (three) times daily. Qty: 90 tablet, Refills: 0    Multiple Vitamin (MULTIVITAMIN) capsule Take 1 capsule by mouth daily.    nitroGLYCERIN (NITROSTAT) 0.4 MG SL tablet Place 1 tablet (0.4 mg total) under the tongue every 5 (five) minutes as needed for chest pain. Qty: 25 tablet, Refills: 3    prasugrel (EFFIENT) 10 MG TABS tablet Take 1 tablet (10 mg total) by mouth daily. Qty: 30 tablet, Refills: 11    Ranitidine HCl (ZANTAC PO) Take 150 mg by mouth daily as needed.     sitaGLIPtin (JANUVIA) 100 MG tablet Take 100 mg by mouth every evening.     Thiamine HCl (VITAMIN B-1 PO) Take 1 tablet by mouth daily.     KRILL OIL PO Take 1 tablet by mouth at bedtime and may repeat dose one time if needed. Reported on 12/13/2015    oxyCODONE-acetaminophen (ROXICET) 5-325 MG tablet Take 0.5 tablets by mouth every 8 (eight) hours as needed for severe pain. Qty: 15 tablet, Refills: 0           Outstanding Labs/Studies   Outpatient echo  Duration of Discharge Encounter   Greater than 30 minutes including physician time.  Hilbert Corrigan PA-C 12/14/2015, 12:46 PM

## 2015-12-14 NOTE — Progress Notes (Signed)
Patient Name: Sarah Stevenson Date of Encounter: 12/14/2015  Primary Cardiologist: Dr. Tamala Julian   Principal Problem:   Chest pain Active Problems:   Diabetes mellitus type 2, noninsulin dependent ([place])   Hyperlipidemia LDL goal <70   Murmur, cardiac   Snoring   CAD S/P percutaneous coronary angioplasty    SUBJECTIVE  Denies any SOB, had 3 episode of chest pain last night.    CURRENT MEDS . atorvastatin  20 mg Oral Daily  . carvedilol  3.125 mg Oral BID WC  . glipiZIDE  10 mg Oral QAC breakfast  . losartan  25 mg Oral Daily  . prasugrel  10 mg Oral Daily  . sodium chloride flush  3 mL Intravenous Q12H    OBJECTIVE  Filed Vitals:   12/13/15 1950 12/13/15 2135 12/14/15 0000 12/14/15 0445  BP: 115/64 136/61 122/55 105/43  Pulse: 84 70 71 68  Temp: 97.9 F (36.6 C)  97.6 F (36.4 C) 97.6 F (36.4 C)  TempSrc: Oral  Oral Oral  Resp:  '18 20 20  '$ Height: '5\' 3"'$  (1.6 m)     Weight: 213 lb 13.5 oz (97 kg)   212 lb 15.4 oz (96.6 kg)  SpO2: 96% 96% 94% 95%    Intake/Output Summary (Last 24 hours) at 12/14/15 0744 Last data filed at 12/14/15 0350  Gross per 24 hour  Intake 861.75 ml  Output   1800 ml  Net -938.25 ml   Filed Weights   12/13/15 1950 12/14/15 0445  Weight: 213 lb 13.5 oz (97 kg) 212 lb 15.4 oz (96.6 kg)    PHYSICAL EXAM  General: Pleasant, NAD. Neuro: Alert and oriented X 3. Moves all extremities spontaneously. Psych: Normal affect. HEENT:  Normal  Neck: Supple without bruits or JVD. Lungs:  Resp regular and unlabored, CTA. Heart: RRR no s3, s4. 4/6 systolic murmur Abdomen: Soft, non-tender, non-distended, BS + x 4.  Extremities: No clubbing, cyanosis or edema. DP/PT/Radials 2+ and equal bilaterally.  Accessory Clinical Findings  CBC  Recent Labs  12/13/15 1005  WBC 9.0  HGB 13.4  HCT 39.4  MCV 94.3  PLT 048   Basic Metabolic Panel  Recent Labs  12/13/15 1005  NA 136  K 4.9  CL 101  CO2 24  GLUCOSE 247*  BUN 22*  CREATININE  1.04*  CALCIUM 9.5   Cardiac Enzymes  Recent Labs  12/13/15 1005  TROPONINI <0.03    TELE NSR without significant ventricular ectopy    ECG  No new EKG  Echocardiogram 12/01/2014  LV EF: 60% -  65%  ------------------------------------------------------------------- Indications:   Aortic stenosis 424.1.  ------------------------------------------------------------------- History:  Risk factors: Diabetes mellitus.  ------------------------------------------------------------------- Study Conclusions  - Left ventricle: The cavity size was normal. Wall thickness was increased in a pattern of mild LVH. Systolic function was normal. The estimated ejection fraction was in the range of 60% to 65%. Wall motion was normal; there were no regional wall motion abnormalities. Doppler parameters are consistent with abnormal left ventricular relaxation (grade 1 diastolic dysfunction). The E/e&' ratio is between 8-15, suggesting indeterminate LV filling pressure. - Aortic valve: Mildly calcified leaflets - cannot r/o bicuspid or functionally bicuspid valve. Moderate aortic stenosis - AVA 1.4 cm2, based on LVOT diameter of 2.0 cm. There was no significant regurgitation. Mean gradient (S): 23 mm Hg. Peak gradient (S): 43 mm Hg. Valve area (VTI): 1.75 cm^2. Valve area (Vmax): 1.41 cm^2. Valve area (Vmean): 1.56 cm^2. - Mitral valve: Calcified annulus. There was  trivial regurgitation. - Left atrium: The atrium was at the upper limits of normal in size. - Inferior vena cava: The vessel was normal in size. The respirophasic diameter changes were in the normal range (= 50%), consistent with normal central venous pressure.  Impressions:  - LVEF 60-65%, mild LVH, diastolic dysfunction, indeterminate LV filling pressure, moderate aortic stenosis AVA 1.4 cm2, mean gradient of 23 mmHg.    Radiology/Studies  Dg Chest 2 View  12/13/2015  CLINICAL  DATA:  Chest pain starting yesterday, repeat pain today, left shoulder pain EXAM: CHEST  2 VIEW COMPARISON:  12/14/2014 FINDINGS: Cardiomediastinal silhouette is stable. No acute infiltrate or pleural effusion. No pulmonary edema. Mild degenerative changes mid and lower thoracic spine. IMPRESSION: No active cardiopulmonary disease. Electronically Signed   By: Lahoma Crocker M.D.   On: 12/13/2015 10:59    ASSESSMENT AND PLAN  1. Chest pain   - cath 12/13/2015 60% mid RCA distal to previously placed stent FFR 0.84, patent RCA stent, 35% mid LAD lesion. No obvious culprit lesion found for chest pain, concerning the possibility that the small 2nd diagonal branch is the lesion, however not PCI amenable.   - continue lipitor, coreg,losartan, effient. Not on ASA due to allergy. Last stent 11/30/2014  - still has intermittent chest pain last night lasting 30-45 secs each time, had 3 episodes of chest pain since cath yesterday, will add low dose Imdur, ambulate today, if no chest pain, discharge. If still has chest discomfort, add ranexa. Her chest pain maybe related to the tiny D2 lesion not amenable to PCI.   2. Moderate AS: 4/6 systolic murmur, echo 3/70/4888 mildly calcififed aortic leaflet, cannot r/o bicuspid or functionally bicuspid valve, AVA 1.4 cm2. She will need repeat outpatient echo to followup  3. CAD s/p DES to RCA in 11/2014  4. HLD 5. DM   Signed, Almyra Deforest PA-C Pager: [phone]  The patient was seen and examined, and I agree with the physical exam, assessment and plan as documented above which has been discussed with Janan Ridge, with modifications as noted below. Continued brief, intermittent episodic chest pain. Will add low dose Imdur. If BP gets too low, could consider Ranexa for small vessel disease. Will need repeat echocardiogram within next 4 weeks for previously documented moderate aortic stenosis. Will discharge to home today.  Kate Sable, MD, [facility]  12/14/2015 8:46 AM

## 2015-12-14 NOTE — Discharge Instructions (Signed)
No driving for 24 hours. No lifting over 5 lbs for 1 week. No sexual activity for 1 week. Keep procedure site clean & dry. If you notice increased pain, swelling, bleeding or pus, call/return!  You may shower, but no soaking baths/hot tubs/pools for 1 week.  ° ° °

## 2015-12-14 NOTE — Plan of Care (Signed)
Problem: Phase I Progression Outcomes Goal: Hemodynamically stable Outcome: Progressing VSS Goal: Anginal pain relieved Outcome: Progressing Denies Goal: Aspirin unless contraindicated Outcome: Not Applicable Date Met:  57/90/38 ASA allergy documented

## 2015-12-15 LAB — GLUCOSE, CAPILLARY: GLUCOSE-CAPILLARY: 221 mg/dL — AB (ref 65–99)

## 2015-12-16 ENCOUNTER — Encounter (HOSPITAL_COMMUNITY): Payer: Self-pay | Admitting: Cardiology

## 2015-12-16 MED FILL — Lidocaine HCl Local Preservative Free (PF) Inj 1%: INTRAMUSCULAR | Qty: 30 | Status: AC

## 2015-12-17 ENCOUNTER — Other Ambulatory Visit: Payer: Self-pay | Admitting: Physician Assistant

## 2015-12-17 MED FILL — LOSARTAN POTASSIUM 50 MG TA: 50 | 90 days supply | Qty: 90 | Fill #2

## 2015-12-17 MED FILL — CARVEDILOL 3.125 MG TABLET: 3.125 | 30 days supply | Qty: 60 | Fill #0

## 2015-12-18 MED FILL — ISOSORBIDE MN ER 30 MG TAB: 30 | 30 days supply | Qty: 15 | Fill #0

## 2015-12-19 ENCOUNTER — Telehealth: Payer: Self-pay | Admitting: *Deleted

## 2015-12-19 DIAGNOSIS — E119 Type 2 diabetes mellitus without complications: Secondary | ICD-10-CM | POA: Diagnosis not present

## 2015-12-19 DIAGNOSIS — F4323 Adjustment disorder with mixed anxiety and depressed mood: Secondary | ICD-10-CM | POA: Diagnosis not present

## 2015-12-19 DIAGNOSIS — E7800 Pure hypercholesterolemia, unspecified: Secondary | ICD-10-CM | POA: Diagnosis not present

## 2015-12-19 DIAGNOSIS — I10 Essential (primary) hypertension: Secondary | ICD-10-CM | POA: Diagnosis not present

## 2015-12-19 NOTE — Telephone Encounter (Signed)
S/w pt is aware of new date and time of appointment due to provider on vacation

## 2015-12-23 ENCOUNTER — Ambulatory Visit: Payer: 59 | Admitting: Rehabilitative and Restorative Service Providers"

## 2015-12-23 DIAGNOSIS — E782 Mixed hyperlipidemia: Secondary | ICD-10-CM | POA: Diagnosis not present

## 2015-12-23 DIAGNOSIS — J301 Allergic rhinitis due to pollen: Secondary | ICD-10-CM | POA: Diagnosis not present

## 2015-12-23 DIAGNOSIS — E1165 Type 2 diabetes mellitus with hyperglycemia: Secondary | ICD-10-CM | POA: Diagnosis not present

## 2015-12-23 DIAGNOSIS — M1612 Unilateral primary osteoarthritis, left hip: Secondary | ICD-10-CM | POA: Diagnosis not present

## 2015-12-23 DIAGNOSIS — R011 Cardiac murmur, unspecified: Secondary | ICD-10-CM | POA: Diagnosis not present

## 2016-01-01 ENCOUNTER — Encounter: Payer: Self-pay | Admitting: Physical Therapy

## 2016-01-01 ENCOUNTER — Ambulatory Visit (INDEPENDENT_AMBULATORY_CARE_PROVIDER_SITE_OTHER): Payer: 59 | Admitting: Physical Therapy

## 2016-01-01 DIAGNOSIS — M25552 Pain in left hip: Secondary | ICD-10-CM | POA: Diagnosis not present

## 2016-01-01 DIAGNOSIS — R6889 Other general symptoms and signs: Secondary | ICD-10-CM

## 2016-01-01 DIAGNOSIS — M2560 Stiffness of unspecified joint, not elsewhere classified: Secondary | ICD-10-CM

## 2016-01-01 DIAGNOSIS — M623 Immobility syndrome (paraplegic): Secondary | ICD-10-CM

## 2016-01-01 DIAGNOSIS — R531 Weakness: Secondary | ICD-10-CM

## 2016-01-01 DIAGNOSIS — Z7409 Other reduced mobility: Secondary | ICD-10-CM

## 2016-01-01 NOTE — Patient Instructions (Signed)
Modified the way she performs leg lengthener, recommend performing with foot in DF not PF

## 2016-01-01 NOTE — Therapy (Signed)
[place], [place], [address] Phone: [phone]   Fax:  [phone]  Physical Therapy Evaluation  Patient Details  Name: Sarah Stevenson MRN: [id number] Date of Birth: 05/05/1956 Referring Provider: Dr Georgina Snell   Encounter Date: 01/01/2016      PT End of Session - 01/01/16 1107    Visit Number 1   Number of Visits 8   Date for PT Re-Evaluation 01/29/16   PT Start Time 1020   PT Stop Time 1121   PT Time Calculation (min) 61 min   Activity Tolerance Patient tolerated treatment well      Past Medical History  Diagnosis Date  . Diabetes mellitus   . Osteoarthritis   . Hyperlipidemia   . Murmur, cardiac     Past Surgical History  Procedure Laterality Date  . Knee surgery      left arthroscopy  . Nasal sinus surgery    . Cardiac catheterization Right 11/30/2014    Procedure: RIGHT/LEFT HEART CATH AND CORONARY ANGIOGRAPHY;  Surgeon: Belva Crome, MD;  Location: [facility] CATH LAB;  Service: Cardiovascular;  Laterality: Right;  . Cardiac catheterization N/A 12/13/2015    Procedure: Left Heart Cath and Coronary Angiography;  Surgeon: Leonie Man, MD;  Location: [place] CV LAB;  Service: Cardiovascular;  Laterality: N/A;  . Cardiac catheterization N/A 12/13/2015    Procedure: Intravascular Pressure Wire/FFR Study;  Surgeon: Leonie Man, MD;  Location: [place] CV LAB;  Service: Cardiovascular;  Laterality: N/A;  RCA    There were no vitals filed for this visit.       Subjective Assessment - 01/01/16 1025    Subjective Pt reports she is still having the same problem in her hip, she doesn't feel like therapy is helping her however her MD wants her to come and try dry needling. The pain will get better and then flare up. She also reports that she had ultrasound to her Lt inner thigh and developed an abcess in that area. She self treated this, didn't go to MD and she was able to clear it  up.[place] states she has been performing some of her HEP from before however not all of them.     Pertinent History Lt knee surgery  1996 with arthroscopic debridement   Diagnostic tests xrays - arthritic changes bilat hips    Patient Stated Goals feel better and stop being in constant pain.    Currently in Pain? Yes   Pain Score 4   varies from 10/10 to 3/10   Pain Location Hip   Pain Orientation Left   Pain Type Chronic pain   Pain Radiating Towards into groin, low back and sometimes to lateral lower leg.    Pain Onset More than a month ago   Pain Frequency Constant   Aggravating Factors  random movements make it worse. 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    Observation/Other Assessments   Focus on Therapeutic Outcomes (FOTO)  50% limitations   Functional Tests   Functional tests Squat;Single leg stance   Squat   Comments shifts to the Rt   Single Leg Stance   Comments increased accessory motion on the Lt LE   Posture/Postural Control   Posture/Postural Control Postural limitations   Posture Comments head forward; shoulders rounded and elevated; flexed forward at hips; weight shifted to Rt    ROM / Strength   AROM / PROM / Strength AROM;Strength   AROM   Overall AROM Comments tight Lt hip throughout   AROM Assessment Site Lumbar;Hip   Right/Left Hip Left   Left Hip External Rotation  35   Left Hip Internal  Rotation  12   Left Hip ABduction 28   Lumbar Flexion to floor, pull in Lt SIJ with return to stand   Lumbar Extension WNL   Lumbar - Right Side Bend 2" less than Lt   Lumbar - Left Side Bend WNL   Lumbar - Right Rotation WNL   Lumbar - Left Rotation WNL   Strength   Strength Assessment Site Hip;Knee;Ankle  Rt WNL   Right/Left Hip Left   Left Hip Flexion 4-/5   Left Hip Extension 5/5   Left Hip ABduction 4+/5  pain in the hip and back   Right/Left Knee --  bilat WNL   Right/Left Ankle --  bilat WNL   Palpation   Spinal mobility hypomobile in lumbar spine, pain with all especially in Lt L3 UPA    Palpation comment tightness in Lt QL and lumbar paraspinals and around Lt hip posterior greater troch.    Ambulation/Gait   Gait Comments antalgic gait with Lt LE ER decreased WB phase on Lt LE                    [facility] Adult PT Treatment/Exercise - 01/01/16 0001    Modalities   Modalities Electrical Stimulation;Moist Heat;Iontophoresis   Moist Heat Therapy   Number Minutes Moist Heat 15 Minutes   Moist Heat Location Lumbar Spine  Lt buttock   Electrical Stimulation   Electrical Stimulation Location Lt lumbar   Electrical Stimulation Action IFC   Electrical Stimulation Parameters to tolerance   Electrical Stimulation Goals Pain;Tone   Iontophoresis   Type of Iontophoresis Dexamethasone   Location Lt hip glut med   Dose 1.3cc   Time 13 hr patch   Manual Therapy   Manual Therapy Joint mobilization;Myofascial release   Joint Mobilization posterior hip mobs Lt   Myofascial Release Lt hip traction and stretching into abduction                PT Education - 01/01/16 1607    Education provided Yes   Education Details modified one exercise   Person(s) Educated Patient   Methods Explanation   Comprehension Verbalized understanding;Returned demonstration             PT Long Term Goals - 01/01/16 1112    PT LONG TERM GOAL #1   Title Improve  ROM/mobility through Lt hip IR =/> 20 degrees ( 524/17)    Time 4   Period Weeks   Status New   PT LONG TERM GOAL #2   Title Improve standing tolerance to 1 hour for work (524/17)   Time  4   Period Weeks   Status New   PT LONG TERM GOAL #3   Title demo strong contraction of TA and multifidi ( 01/29/16)    Time 4   Period Weeks   Status New   PT LONG TERM GOAL #4   Title Decrease pain =/> 75% at the end of day (01/30/16)   Time 4   Period Weeks   Status New   PT LONG TERM GOAL #5   Title Improve FOTO to </= 39% limitation (01/30/16)   Time 4   Period Weeks   Status New               Plan - 01/01/16 1607    Clinical Impression Statement Pt with h/o Lt hip pain, presents today with reports of having pain in her Lt side low back and groin. She has some hypomobility in her Lt hip and lumbar spine along with multiple areas of tight and painful muscles in the Lt side back , hip and thigh    Rehab Potential Good   PT Frequency 2x / week   PT Duration 6 weeks   PT Treatment/Interventions Patient/family education;Therapeutic exercise;Therapeutic activities;Manual techniques;Neuromuscular re-education;Dry needling;Cryotherapy;Electrical Stimulation;Moist Heat;ADLs/Self Care Home Management;Ultrasound;Taping;Iontophoresis '4mg'$ /ml Dexamethasone   PT Next Visit Plan cont ionto to [employer] med, manual work to [employer] low back and hip, core stability, TDN    Consulted and Agree with Plan of Care Patient      Patient will benefit from skilled therapeutic intervention in order to improve the following deficits and impairments:  Postural dysfunction, Improper body mechanics, Decreased range of motion, Decreased mobility, Decreased strength, Pain, Decreased endurance, Decreased activity tolerance  Visit Diagnosis: Pain, hip, left - Plan: PT plan of care cert/re-cert  Stiffness due to immobility - Plan: PT plan of care cert/re-cert  Decreased strength, endurance, and mobility - Plan: PT plan of  care cert/re-cert     Problem List Patient Active Problem List   Diagnosis Date Noted  . Chest pain 12/13/2015  . CAD S/P percutaneous coronary angioplasty   . Trochanteric bursitis of left hip 08/14/2015  . Arthritis of left hip 08/14/2015  . Excessive daytime sleepiness 03/03/2015  . Snoring 12/20/2014  . CAD (coronary artery disease), native coronary artery 12/19/2014  . Diabetes mellitus type 2, noninsulin dependent ([place])   . Hyperlipidemia LDL goal <70   . Murmur, cardiac   . NSTEMI (non-ST elevated myocardial infarction) ([place]) 11/30/2014    Jeral Pinch PT 01/01/2016, [place] [place], [place], [address] Phone: [phone]   Fax:  [phone]  Name: CHRISTALYN GOERTZ MRN: [id number] Date of Birth: [birth year]

## 2016-01-02 ENCOUNTER — Ambulatory Visit (HOSPITAL_COMMUNITY): Payer: 59 | Attending: Cardiology

## 2016-01-02 ENCOUNTER — Other Ambulatory Visit: Payer: Self-pay

## 2016-01-02 DIAGNOSIS — I350 Nonrheumatic aortic (valve) stenosis: Secondary | ICD-10-CM | POA: Insufficient documentation

## 2016-01-02 DIAGNOSIS — I517 Cardiomegaly: Secondary | ICD-10-CM | POA: Diagnosis not present

## 2016-01-02 DIAGNOSIS — I340 Nonrheumatic mitral (valve) insufficiency: Secondary | ICD-10-CM | POA: Insufficient documentation

## 2016-01-03 ENCOUNTER — Encounter: Payer: 59 | Admitting: Physical Therapy

## 2016-01-03 ENCOUNTER — Ambulatory Visit: Payer: 59 | Admitting: Nurse Practitioner

## 2016-01-03 MED FILL — glipiZIDE XL 10 MG TB24: 10 | 30 days supply | Qty: 30 | Fill #0

## 2016-01-03 MED FILL — METFORMIN HCL ER 750 MG TAB: 750 | 90 days supply | Qty: 180 | Fill #0

## 2016-01-06 ENCOUNTER — Ambulatory Visit (INDEPENDENT_AMBULATORY_CARE_PROVIDER_SITE_OTHER): Payer: 59 | Admitting: Physical Therapy

## 2016-01-06 ENCOUNTER — Encounter: Payer: Self-pay | Admitting: Physical Therapy

## 2016-01-06 DIAGNOSIS — M25552 Pain in left hip: Secondary | ICD-10-CM

## 2016-01-06 NOTE — Therapy (Signed)
[place], [place], [address] Phone: [phone]   Fax:  [phone]  Physical Therapy Treatment  Patient Details  Name: BUNA CUPPETT MRN: [id number] Date of Birth: 1956/04/10 Referring Provider: Dr Georgina Snell   Encounter Date: 01/06/2016      PT End of Session - 01/06/16 1149    Visit Number 2   Number of Visits 8   Date for PT Re-Evaluation 01/29/16   PT Start Time 1151   PT Stop Time 1255   PT Time Calculation (min) 64 min   Activity Tolerance Patient tolerated treatment well      Past Medical History  Diagnosis Date  . Diabetes mellitus   . Osteoarthritis   . Hyperlipidemia   . Murmur, cardiac     Past Surgical History  Procedure Laterality Date  . Knee surgery      left arthroscopy  . Nasal sinus surgery    . Cardiac catheterization Right 11/30/2014    Procedure: RIGHT/LEFT HEART CATH AND CORONARY ANGIOGRAPHY;  Surgeon: Belva Crome, MD;  Location: [facility] CATH LAB;  Service: Cardiovascular;  Laterality: Right;  . Cardiac catheterization N/A 12/13/2015    Procedure: Left Heart Cath and Coronary Angiography;  Surgeon: Leonie Man, MD;  Location: [place] CV LAB;  Service: Cardiovascular;  Laterality: N/A;  . Cardiac catheterization N/A 12/13/2015    Procedure: Intravascular Pressure Wire/FFR Study;  Surgeon: Leonie Man, MD;  Location: [place] CV LAB;  Service: Cardiovascular;  Laterality: N/A;  RCA    There were no vitals filed for this visit.      Subjective Assessment - 01/06/16 1151    Subjective She states that she thinkgs the patch helped her, she also starte doing a new psoas stretch using a yoga block.and then she was able to mow her lawn.   Patient Stated Goals feel better and stop being in constant pain.    Currently in Pain? Yes, 5/10   Pain Location Hip   Pain Orientation Left   Pain Descriptors / Indicators Sore   Pain Type Chronic pain   Aggravating Factors   walking on uneven surfaces   Pain Relieving Factors stretching with yoga block                         [facility] Adult PT Treatment/Exercise - 01/06/16 0001    Modalities   Modalities Electrical Stimulation;Moist Heat;Iontophoresis   Moist Heat Therapy   Number Minutes Moist Heat 20 Minutes   Moist Heat Location Lumbar Spine;Hip  thigh, Lt   Electrical Stimulation   Electrical Stimulation Location Lt lumbar, hip, thigh   Electrical Stimulation Action IFC   Electrical Stimulation Parameters to tolerance   Electrical Stimulation Goals Pain;Tone   Iontophoresis   Type of Iontophoresis Dexamethasone   Location Lt hip glut med   Dose 1.3cc   Time 13 hr patch   Manual Therapy   Manual Therapy Joint mobilization;Soft tissue mobilization;Passive ROM;Manual Traction   Joint Mobilization posterior Lt hip mobs, lumbar mobs CPA grade II   Soft tissue mobilization Lt buttocks, thigh   Passive ROM stretching into Lt hip ER/IR/extension   Manual Traction Lt LE          Trigger Point Dry Needling - 01/06/16 1156    Consent Given? Yes   Education Handout Provided Yes   Muscles Treated Lower Body Piriformis;Quadriceps;Tensor fascia lata;Hamstring  glut med  all Left side  Piriformis Response Twitch response elicited;Palpable increased muscle length   Tensor Fascia Lata Response Palpable increased muscle length;Twitch response elicited   Quadriceps Response Palpable increased muscle length;Twitch response elicited   Hamstring Response Palpable increased muscle length;Twitch response elicited                   PT Long Term Goals - 01/06/16 1150    PT LONG TERM GOAL #1   Title Improve ROM/mobility through Lt hip IR =/> 20 degrees ( 524/17)    Time 4   Period Weeks   Status On-going   PT LONG TERM GOAL #2   Title Improve standing tolerance to 1 hour for work (524/17)   Time 4   Period Weeks   Status On-going   PT LONG TERM GOAL #3   Title demo strong  contraction of TA and multifidi ( 01/29/16)    Time 4   Period Weeks   Status On-going   PT LONG TERM GOAL #4   Title Decrease pain =/> 75% at the end of day (01/30/16)   Time 4   Period Weeks   Status On-going   PT LONG TERM GOAL #5   Title Improve FOTO to </= 39% limitation (01/30/16)   Time 4   Period Weeks   Status On-going               Plan - 01/06/16 1242    Clinical Impression Statement This is Khilee's second visit since returning for PT, she tolerated TDN well with good twitch response.  No goals met.    Rehab Potential Good   PT Frequency 2x / week   PT Duration 6 weeks   PT Treatment/Interventions Patient/family education;Therapeutic exercise;Therapeutic activities;Manual techniques;Neuromuscular re-education;Dry needling;Cryotherapy;Electrical Stimulation;Moist Heat;ADLs/Self Care Home Management;Ultrasound;Taping;Iontophoresis 80m/ml Dexamethasone   PT Next Visit Plan assess response to TDN and continued ionto   Consulted and Agree with Plan of Care Patient      Patient will benefit from skilled therapeutic intervention in order to improve the following deficits and impairments:  Postural dysfunction, Improper body mechanics, Decreased range of motion, Decreased mobility, Decreased strength, Pain, Decreased endurance, Decreased activity tolerance  Visit Diagnosis: Pain, hip, left     Problem List Patient Active Problem List   Diagnosis Date Noted  . Chest pain 12/13/2015  . CAD S/P percutaneous coronary angioplasty   . Trochanteric bursitis of left hip 08/14/2015  . Arthritis of left hip 08/14/2015  . Excessive daytime sleepiness 03/03/2015  . Snoring 12/20/2014  . CAD (coronary artery disease), native coronary artery 12/19/2014  . Diabetes mellitus type 2, noninsulin dependent ([place]   . Hyperlipidemia LDL goal <70   . Murmur, cardiac   . NSTEMI (non-ST elevated myocardial infarction) ([place] 11/30/2014    SJeral PinchPT 01/06/2016, 12:49  PM  [facility] [address]Phone: [phone]  Fax:  [phone] Name: TSEANNE CHIRICOMRN: [id number] of Birth: 1Mar 14, 1957

## 2016-01-07 ENCOUNTER — Ambulatory Visit (INDEPENDENT_AMBULATORY_CARE_PROVIDER_SITE_OTHER): Payer: 59 | Admitting: Nurse Practitioner

## 2016-01-07 ENCOUNTER — Encounter: Payer: Self-pay | Admitting: Nurse Practitioner

## 2016-01-07 ENCOUNTER — Ambulatory Visit: Payer: 59 | Admitting: Nurse Practitioner

## 2016-01-07 VITALS — BP 132/80 | HR 76 | Ht 63.0 in | Wt 215.4 lb

## 2016-01-07 DIAGNOSIS — E119 Type 2 diabetes mellitus without complications: Secondary | ICD-10-CM

## 2016-01-07 DIAGNOSIS — E785 Hyperlipidemia, unspecified: Secondary | ICD-10-CM

## 2016-01-07 DIAGNOSIS — I2510 Atherosclerotic heart disease of native coronary artery without angina pectoris: Secondary | ICD-10-CM | POA: Diagnosis not present

## 2016-01-07 MED ORDER — CARVEDILOL 3.125 MG PO TABS: ORAL_TABLET | ORAL | Status: DC

## 2016-01-07 MED ORDER — PRASUGREL HCL 10 MG PO TABS: 10.0000 mg | ORAL_TABLET | Freq: Every day | ORAL | Status: DC

## 2016-01-07 MED FILL — CARVEDILOL 3.125 MG TABLET: 3.125 | 90 days supply | Qty: 180 | Fill #0

## 2016-01-07 NOTE — Patient Instructions (Addendum)
We will be checking the following labs today - NONE   Medication Instructions:    Continue with your current medicines.   I sent in your refills.   Ok to take the Imdur at night    Testing/Procedures To Be Arranged:  N/A  Follow-Up:   See Dr. Tamala Julian in 3 months    Other Special Instructions:   Think about what we talked about today.    If you need a refill on your cardiac medications before your next appointment, please call your pharmacy.   Call the [place] office at [phone] if you have any questions, problems or concerns.

## 2016-01-07 NOTE — Progress Notes (Signed)
CARDIOLOGY OFFICE NOTE  Date:  01/07/2016    Sarah Stevenson Date of Birth: Feb 24, 1956 Medical Record #[id number]  PCP:  Woody Seller, MD  Cardiologist:  Tamala Julian    Chief Complaint  Patient presents with  . Coronary Artery Disease  . Chest Pain  . Cardiac Valve Problem    Post hospital visit - seen for Dr. Tamala Julian    History of Present Illness: Sarah Stevenson is a 60 y.o. female who presents today for a post hospital/one year check. Seen for Dr. Tamala Julian.   She has a history of DM, HLD and CAD with prior DES RCA in March 2016 in the setting of unstable angina. She did have residual LAD disease at 50 to 70%.   Presented to [facility] a month ago with chest pain. Negative for MI. Underwent cardiac cath - stable findings - to continue with medical therapy and Imdur was added. Noted to have loud murmur at discharge - known AS - for outpatient echo. This was updated - see below.   Comes back today. Here alone. She has several issues - feels exhausted - thinks its from the [place]. Needs her echo resulted and wants a copy. Still with some headache but this is improving. She has lots of stress - her brother with severe health issues/previously homeless - lives with her still. No more chest pain. This is totally resolved. Mostly limited by back/hip pain - very limited in her ability to exercise. Recent A1C was over 9. Tearful today about her overall situation.   Past Medical History  Diagnosis Date  . Diabetes mellitus   . Osteoarthritis   . Hyperlipidemia   . Murmur, cardiac     Past Surgical History  Procedure Laterality Date  . Knee surgery      left arthroscopy  . Nasal sinus surgery    . Cardiac catheterization Right 11/30/2014    Procedure: RIGHT/LEFT HEART CATH AND CORONARY ANGIOGRAPHY;  Surgeon: Belva Crome, MD;  Location: [facility] CATH LAB;  Service: Cardiovascular;  Laterality: Right;  . Cardiac catheterization N/A 12/13/2015    Procedure: Left Heart Cath and Coronary  Angiography;  Surgeon: Leonie Man, MD;  Location: [place] CV LAB;  Service: Cardiovascular;  Laterality: N/A;  . Cardiac catheterization N/A 12/13/2015    Procedure: Intravascular Pressure Wire/FFR Study;  Surgeon: Leonie Man, MD;  Location: [place] CV LAB;  Service: Cardiovascular;  Laterality: N/A;  RCA     Medications: Current Outpatient Prescriptions  Medication Sig Dispense Refill  . Acetaminophen (TYLENOL ARTHRITIS PAIN PO) Take 650 mg by mouth daily as needed. For pain    . ALPRAZolam (XANAX) 0.5 MG tablet Take 0.5 mg by mouth at bedtime as needed for anxiety.    Marland Kitchen atorvastatin (LIPITOR) 20 MG tablet Take 20 mg by mouth daily.    . carvedilol (COREG) 3.125 MG tablet TAKE 1 TABLET BY MOUTH TWICE DAILY WITH A MEAL 180 tablet 3  . celecoxib (CELEBREX) 100 MG capsule Take 100 mg by mouth daily.     . cetirizine (ZYRTEC) 10 MG tablet Take 10 mg by mouth daily.    . fluticasone (FLONASE) 50 MCG/ACT nasal spray Place 1 spray into both nostrils daily as needed for allergies. For allergies    . glipiZIDE (GLUCOTROL) 5 MG tablet Take 10 mg by mouth daily before breakfast.     . isosorbide mononitrate (IMDUR) 30 MG 24 hr tablet Take 0.5 tablets (15 mg total) by mouth daily. [place]  tablet 5  . KRILL OIL PO Take 1 tablet by mouth at bedtime and may repeat dose one time if needed. Reported on 12/13/2015    . losartan (COZAAR) 25 MG tablet Take 25 mg by mouth daily.     Marland Kitchen METFORMIN HCL PO Take 1,500 mg by mouth daily.     . methocarbamol (ROBAXIN) 500 MG tablet Take 1 tablet (500 mg total) by mouth 3 (three) times daily. (Patient taking differently: Take 500 mg by mouth every 6 (six) hours as needed. ) 90 tablet 0  . Multiple Vitamin (MULTIVITAMIN) capsule Take 1 capsule by mouth daily.    . nitroGLYCERIN (NITROSTAT) 0.4 MG SL tablet Place 1 tablet (0.4 mg total) under the tongue every 5 (five) minutes as needed for chest pain. 25 tablet 3  . NON FORMULARY Take 600 mg by mouth 2 (two) times  daily. TUMERIC    . oxyCODONE-acetaminophen (ROXICET) 5-325 MG tablet Take 0.5 tablets by mouth every 8 (eight) hours as needed for severe pain. 15 tablet 0  . prasugrel (EFFIENT) 10 MG TABS tablet Take 1 tablet (10 mg total) by mouth daily. 90 tablet 3  . Ranitidine HCl (ZANTAC PO) Take 150 mg by mouth daily as needed.     . sertraline (ZOLOFT) 50 MG tablet Take 50 mg by mouth daily.   3  . sitaGLIPtin (JANUVIA) 100 MG tablet Take 100 mg by mouth every evening.     . Thiamine HCl (VITAMIN B-1 PO) Take 1 tablet by mouth daily.      No current facility-administered medications for this visit.    Allergies: Allergies  Allergen Reactions  . Asa [Aspirin] Rash  . Blue Dyes (Parenteral)     rash  . Corn-Containing Products     hives  . Nsaids     rash  . Sugar-Protein-Starch     hives  . Tea     hives  . Hetastarch Rash    hives  . Isosulfan Blue Rash    rash  . Tolmetin Rash    rash    Social History: The patient  reports that she has quit smoking. She does not have any smokeless tobacco history on file. She reports that she does not drink alcohol or use illicit drugs.   Family History: The patient's family history includes Heart attack in her father and mother; Sudden death in her father. There is no history of Hypertension, Hyperlipidemia, or Diabetes.   Review of Systems: Please see the history of present illness.   Otherwise, the review of systems is positive for none.   All other systems are reviewed and negative.   Physical Exam: VS:  BP 132/80 mmHg  Pulse 76  Ht '5\' 3"'$  (1.6 m)  Wt 215 lb 6.4 oz (97.705 kg)  BMI 38.17 kg/m2 .  BMI Body mass index is 38.17 kg/(m^2).  Wt Readings from Last 3 Encounters:  01/07/16 215 lb 6.4 oz (97.705 kg)  12/14/15 212 lb 15.4 oz (96.6 kg)  12/12/15 215 lb (97.523 kg)    General: Pleasant. Well developed, well nourished and in no acute distress.  HEENT: Normal. Neck: Supple, no JVD, carotid bruits, or masses noted.  Cardiac:  Regular rate and rhythm. Harsh outflow murmur. No edema.  Respiratory:  Lungs are clear to auscultation bilaterally with normal work of breathing.  GI: Soft and nontender.  MS: No deformity or atrophy. Gait and ROM intact. Skin: Warm and dry. Color is normal.  Neuro:  Strength and sensation  are intact and no gross focal deficits noted.  Psych: Alert, appropriate and with normal affect.   LABORATORY DATA:  EKG:  EKG is not ordered today.   Lab Results  Component Value Date   WBC 9.0 12/13/2015   HGB 13.4 12/13/2015   HCT 39.4 12/13/2015   PLT 241 12/13/2015   GLUCOSE 223* 12/14/2015   CHOL 121 12/01/2014   TRIG 121 12/01/2014   HDL 25* 12/01/2014   LDLCALC 72 12/01/2014   ALT 21 12/01/2014   AST 20 12/01/2014   NA 141 12/14/2015   K 4.2 12/14/2015   CL 105 12/14/2015   CREATININE 0.98 12/14/2015   BUN 15 12/14/2015   CO2 25 12/14/2015    BNP (last 3 results) No results for input(s): BNP in the last 8760 hours.  ProBNP (last 3 results) No results for input(s): PROBNP in the last 8760 hours.   Other Studies Reviewed Today: Cardiac cath   Conclusion     Mid RCA lesion, 60% stenosed - distal to the stent. FFR 0.84.  Wiidely patent proximl RCA stent  Mid LAD lesion, 35% stenosed.  The left ventricular systolic function is normal.  Severe dx in small D2 not amenable to PCI   No obvious Culprit lesion found for unstable angina.concerrning the ossibility that the small diagonal branch is the lesion, however this is not PCI amenable.  Plan: Overnight Observation for medication titration. Consider adding Nitrate or Ranexa if CP persists.  Expected d/c in AM.       Echo Study Conclusions from 12/2015  - Left ventricle: The cavity size was normal. There was mild  concentric hypertrophy. Systolic function was normal. The  estimated ejection fraction was in the range of 60% to 65%. Wall  motion was normal; there were no regional wall motion   abnormalities. Doppler parameters are consistent with abnormal  left ventricular relaxation (grade 1 diastolic dysfunction). - Aortic valve: Valve mobility was restricted. There was moderate  stenosis. Peak velocity (S): 378 cm/s. Mean gradient (S): 32 mm  Hg. Valve area (VTI): 1.34 cm^2. - Mitral valve: There was mild regurgitation. - Left atrium: The atrium was mildly dilated.  Impressions:  - Prior aortic stenosis mean gradient 37mHg. Current 338mg.  Assessment/Plan: 1. Chest pain - recent cardiac cath with stable findings - her chest pain is resolved with current medical therapy.   2. Moderate AS - no cardinal symptoms - will need following going forward.   3. CAD - improved symptoms with recent cath.   4. HLD - on statin  5. HTN - BP ok on current reigmen.   6. Fatigue - I suspect this is multifactorial. She needs to get her back pain under control to work on her sugar and weight. I suggested getting into a water program. If she is exercising and still feeling fatigued would consider changing her Coreg but for now she is agreeable to continuing. I think her siblings need to be helping her with their brother.   Current medicines are reviewed with the patient today.  The patient does not have concerns regarding medicines other than what has been noted above.  The following changes have been made:  See above.  Labs/ tests ordered today include:   No orders of the defined types were placed in this encounter.     Disposition:   FU with Dr. SmTamala Juliann one year.   Patient is agreeable to this plan and will call if any problems develop in the interim.  Signed: Burtis Junes, RN, ANP-C 01/07/2016 10:38 AM  [facility] [address] [place], [place]  [address] Phone: [phone] Fax: [phone]

## 2016-01-08 MED FILL — EFFIENT 10 MG TABLET: 10 | 30 days supply | Qty: 30 | Fill #0

## 2016-01-15 ENCOUNTER — Ambulatory Visit (INDEPENDENT_AMBULATORY_CARE_PROVIDER_SITE_OTHER): Payer: 59 | Admitting: Physical Therapy

## 2016-01-15 ENCOUNTER — Encounter: Payer: Self-pay | Admitting: Physical Therapy

## 2016-01-15 DIAGNOSIS — M6281 Muscle weakness (generalized): Secondary | ICD-10-CM | POA: Diagnosis not present

## 2016-01-15 DIAGNOSIS — M25552 Pain in left hip: Secondary | ICD-10-CM

## 2016-01-15 NOTE — Therapy (Signed)
[facility] [place], [place], [address] Phone: [phone]   Fax:  [phone]  Physical Therapy Treatment  Patient Details  Name: Sarah Stevenson MRN: [id number] Date of Birth: [birth year] Referring Provider: Dr Georgina Snell   Encounter Date: 01/15/2016      PT End of Session - 01/15/16 1558    Visit Number 3   Number of Visits 8   Date for PT Re-Evaluation 01/29/16   PT Start Time 4709   PT Stop Time 1616   PT Time Calculation (min) 59 min   Activity Tolerance Patient tolerated treatment well      Past Medical History  Diagnosis Date  . Diabetes mellitus   . Osteoarthritis   . Hyperlipidemia   . Murmur, cardiac     Past Surgical History  Procedure Laterality Date  . Knee surgery      left arthroscopy  . Nasal sinus surgery    . Cardiac catheterization Right 11/30/2014    Procedure: RIGHT/LEFT HEART CATH AND CORONARY ANGIOGRAPHY;  Surgeon: Belva Crome, MD;  Location: [facility] CATH LAB;  Service: Cardiovascular;  Laterality: Right;  . Cardiac catheterization N/A 12/13/2015    Procedure: Left Heart Cath and Coronary Angiography;  Surgeon: Leonie Man, MD;  Location: [place] CV LAB;  Service: Cardiovascular;  Laterality: N/A;  . Cardiac catheterization N/A 12/13/2015    Procedure: Intravascular Pressure Wire/FFR Study;  Surgeon: Leonie Man, MD;  Location: [place] CV LAB;  Service: Cardiovascular;  Laterality: N/A;  RCA    There were no vitals filed for this visit.      Subjective Assessment - 01/15/16 1520    Subjective Pt is walking with very bad limp and she reports it started about 2 days after her last treatment.  She states the muscles don't feel as tight however 90% of her steps feel like her femurs are squishing on the inside.    Patient Stated Goals feel better and stop being in constant pain.    Currently in Pain? Yes   Pain Score 2   shoots up to 7/10   Pain Location Hip   Pain  Orientation Left   Pain Descriptors / Indicators Sore;Stabbing;Aching   Pain Radiating Towards groing and outside of Lt LE   Pain Onset More than a month ago   Pain Frequency Constant   Aggravating Factors  walking after prolonged sitting and on uneven surfaces   Pain Relieving Factors nothing once she is up on her leg.    Multiple Pain Sites Yes   Pain Score 5   Pain Location Knee   Pain Orientation Right   Pain Descriptors / Indicators Sore   Pain Onset In the past 7 days   Pain Frequency Constant   Aggravating Factors  walking    Pain Relieving Factors heat and ice                         [facility] Adult PT Treatment/Exercise - 01/15/16 0001    Knee/Hip Exercises: Stretches   Other Knee/Hip Stretches cat/cow   Modalities   Modalities Electrical Stimulation;Iontophoresis;Moist Heat   Moist Heat Therapy   Number Minutes Moist Heat 15 Minutes   Moist Heat Location Lumbar Spine;Hip   Electrical Stimulation   Electrical Stimulation Location Lt lumbar, hip, thigh   Electrical Stimulation Action IFC   Electrical Stimulation Parameters  to tolerance   Electrical Stimulation Goals Pain;Tone   Iontophoresis  Type of Iontophoresis Dexamethasone   Location Lt hip glut med   Dose 1.3cc   Time 13 hr patch   Manual Therapy   Manual Therapy Joint mobilization;Soft tissue mobilization   Joint Mobilization lumbar CPA, bilat UPA grade III most pain with L3 CPA and Rt UPA  focus on L3 CPA transverse process Rt    Soft tissue mobilization Rt lumbar paraspinalsa          Trigger Point Dry Needling - 01/15/16 1542    Consent Given? Yes   Education Handout Provided No   Muscles Treated Upper Body Longissimus  L4-1, with stim   Longissimus Response Palpable increased muscle length;Twitch response elicited                   PT Long Term Goals - 01/15/16 1537    PT LONG TERM GOAL #1   Title Improve ROM/mobility through Lt hip IR =/> 20 degrees ( 524/17)     Time 4   Period Weeks   Status On-going   PT LONG TERM GOAL #2   Title Improve standing tolerance to 1 hour for work (524/17)   Time 4   Period Weeks   Status On-going   PT LONG TERM GOAL #3   Title demo strong contraction of TA and multifidi ( 01/29/16)    Time 4   Period Weeks   Status On-going   PT LONG TERM GOAL #4   Title Decrease pain =/> 75% at the end of day (01/30/16)   Time 4   Period Weeks   Status On-going   PT LONG TERM GOAL #5   Title Improve FOTO to </= 39% limitation (01/30/16)   Time 4   Period Weeks   Status On-going               Plan - 01/15/16 1543    Clinical Impression Statement Pt with slow to no progress, she felt good for one day after first treatment, 2 days after the second treatment and then her pain returned worse with weightbearing.  She is still tight through her lumbar paraspinals and this loosened up some with TDN and stim.   She is still very weak through her core and could have feelings of instability due to this.    Rehab Potential Good   PT Frequency 2x / week   PT Duration 6 weeks   PT Treatment/Interventions Patient/family education;Therapeutic exercise;Therapeutic activities;Manual techniques;Neuromuscular re-education;Dry needling;Cryotherapy;Electrical Stimulation;Moist Heat;ADLs/Self Care Home Management;Ultrasound;Taping;Iontophoresis '4mg'$ /ml Dexamethasone   PT Next Visit Plan assess response to TDN and continued ionto, possibly more spinal mobs, FOTO for MD note   Consulted and Agree with Plan of Care Patient      Patient will benefit from skilled therapeutic intervention in order to improve the following deficits and impairments:  Postural dysfunction, Improper body mechanics, Decreased range of motion, Decreased mobility, Decreased strength, Pain, Decreased endurance, Decreased activity tolerance  Visit Diagnosis: Muscle weakness (generalized)  Pain, hip, left     Problem List Patient Active Problem List   Diagnosis  Date Noted  . Chest pain 12/13/2015  . CAD S/P percutaneous coronary angioplasty   . Trochanteric bursitis of left hip 08/14/2015  . Arthritis of left hip 08/14/2015  . Excessive daytime sleepiness 03/03/2015  . Snoring 12/20/2014  . CAD (coronary artery disease), native coronary artery 12/19/2014  . Diabetes mellitus type 2, noninsulin dependent ([place])   . Hyperlipidemia LDL goal <70   . Murmur, cardiac   . NSTEMI (  non-ST elevated myocardial infarction) ([place]) 11/30/2014    Sarah Stevenson PT 01/15/2016, 4:06 PM  [facility] [place], [place], [address] Phone: [phone]   Fax:  [phone]  Name: Sarah Stevenson MRN: [id number] Date of Birth: 1956/06/03

## 2016-01-17 ENCOUNTER — Encounter: Payer: 59 | Admitting: Physical Therapy

## 2016-01-21 ENCOUNTER — Ambulatory Visit (INDEPENDENT_AMBULATORY_CARE_PROVIDER_SITE_OTHER): Payer: 59 | Admitting: Family Medicine

## 2016-01-21 ENCOUNTER — Encounter: Payer: Self-pay | Admitting: Family Medicine

## 2016-01-21 VITALS — BP 162/98 | HR 88 | Wt 216.0 lb

## 2016-01-21 DIAGNOSIS — M7062 Trochanteric bursitis, left hip: Secondary | ICD-10-CM

## 2016-01-21 DIAGNOSIS — M1990 Unspecified osteoarthritis, unspecified site: Secondary | ICD-10-CM

## 2016-01-21 DIAGNOSIS — M1612 Unilateral primary osteoarthritis, left hip: Secondary | ICD-10-CM

## 2016-01-21 NOTE — Progress Notes (Signed)
Sarah Stevenson is a 60 y.o. female who presents to [facility] today for left hip pain. Patient seen last month for left hip pain thought to be due to greater trochanteric bursitis with some component of hip arthritis. She is attended physical therapy and done reasonably well with some improvement. She continues to have significant groin pain and some left lateral hip pain. No weakness or numbness or loss of function. No fevers or chills. No recent injury.   Past Medical History  Diagnosis Date  . Diabetes mellitus   . Osteoarthritis   . Hyperlipidemia   . Murmur, cardiac    Past Surgical History  Procedure Laterality Date  . Knee surgery      left arthroscopy  . Nasal sinus surgery    . Cardiac catheterization Right 11/30/2014    Procedure: RIGHT/LEFT HEART CATH AND CORONARY ANGIOGRAPHY;  Surgeon: Belva Crome, MD;  Location: [facility] CATH LAB;  Service: Cardiovascular;  Laterality: Right;  . Cardiac catheterization N/A 12/13/2015    Procedure: Left Heart Cath and Coronary Angiography;  Surgeon: Leonie Man, MD;  Location: [facility] CV LAB;  Service: Cardiovascular;  Laterality: N/A;  . Cardiac catheterization N/A 12/13/2015    Procedure: Intravascular Pressure Wire/FFR Study;  Surgeon: Leonie Man, MD;  Location: [place] CV LAB;  Service: Cardiovascular;  Laterality: N/A;  RCA   Social History  Substance Use Topics  . Smoking status: Former Research scientist (life sciences)  . Smokeless tobacco: Not on file  . Alcohol Use: No   family history includes Heart attack in her father and mother; Sudden death in her father. There is no history of Hypertension, Hyperlipidemia, or Diabetes.  ROS as above Medications: Current Outpatient Prescriptions  Medication Sig Dispense Refill  . Acetaminophen (TYLENOL ARTHRITIS PAIN PO) Take 650 mg by mouth daily as needed. For pain    . ALPRAZolam (XANAX) 0.5 MG  tablet Take 0.5 mg by mouth at bedtime as needed for anxiety.    Marland Kitchen atorvastatin (LIPITOR) 20 MG tablet Take 20 mg by mouth daily.    . carvedilol (COREG) 3.125 MG tablet TAKE 1 TABLET BY MOUTH TWICE DAILY WITH A MEAL 180 tablet 3  . celecoxib (CELEBREX) 100 MG capsule Take 100 mg by mouth daily.     . cetirizine (ZYRTEC) 10 MG tablet Take 10 mg by mouth daily.    . fluticasone (FLONASE) 50 MCG/ACT nasal spray Place 1 spray into both nostrils daily as needed for allergies. For allergies    . glipiZIDE (GLUCOTROL) 5 MG tablet Take 10 mg by mouth daily before breakfast.     . isosorbide mononitrate (IMDUR) 30 MG 24 hr tablet Take 0.5 tablets (15 mg total) by mouth daily. 30 tablet 5  . KRILL OIL PO Take 1 tablet by mouth at bedtime and may repeat dose one time if needed. Reported on 12/13/2015    . losartan (COZAAR) 25 MG tablet Take 25 mg by mouth daily.     Marland Kitchen METFORMIN HCL PO Take 1,500 mg by mouth daily.     . methocarbamol (ROBAXIN) 500 MG tablet Take 1 tablet (500 mg total) by mouth 3 (three) times daily. (Patient taking differently: Take 500 mg by mouth every 6 (six) hours as needed. ) 90 tablet 0  . Multiple Vitamin (MULTIVITAMIN) capsule Take 1 capsule by mouth daily.    . nitroGLYCERIN (NITROSTAT) 0.4 MG SL tablet Place 1 tablet (0.4 mg total) under the tongue  every 5 (five) minutes as needed for chest pain. 25 tablet 3  . NON FORMULARY Take 600 mg by mouth 2 (two) times daily. TUMERIC    . oxyCODONE-acetaminophen (ROXICET) 5-325 MG tablet Take 0.5 tablets by mouth every 8 (eight) hours as needed for severe pain. 15 tablet 0  . prasugrel (EFFIENT) 10 MG TABS tablet Take 1 tablet (10 mg total) by mouth daily. 90 tablet 3  . Ranitidine HCl (ZANTAC PO) Take 150 mg by mouth daily as needed.     . sertraline (ZOLOFT) 50 MG tablet Take 50 mg by mouth daily.   3  . sitaGLIPtin (JANUVIA) 100 MG tablet Take 100 mg by mouth every evening.     . Thiamine HCl (VITAMIN B-1 PO) Take 1 tablet by mouth  daily.      No current facility-administered medications for this visit.   Allergies  Allergen Reactions  . Asa [Aspirin] Rash  . Blue Dyes (Parenteral)     rash  . Corn-Containing Products     hives  . Nsaids     rash  . Sugar-Protein-Starch     hives  . Tea     hives  . Hetastarch Rash    hives  . Isosulfan Blue Rash    rash  . Tolmetin Rash    rash     Exam:  BP 162/98 mmHg  Pulse 88  Wt 216 lb (97.977 kg) Gen: Well NAD Left hip: Decreased internal and external rotation with pain. Tender palpation greater trochanter. Antalgic gait.   Procedure: Real-time Ultrasound Guided Injection of left femoral acetabular joint  Device: GE Logiq E  Images permanently stored and available for review in the ultrasound unit. Verbal informed consent obtained. Discussed risks and benefits of procedure. Warned about infection bleeding damage to structures skin hypopigmentation and fat atrophy among others. Patient expresses understanding and agreement Time-out conducted.  Noted no overlying erythema, induration, or other signs of local infection.  Skin prepped in a sterile fashion.  Local anesthesia: Topical Ethyl chloride.  With sterile technique and under real time ultrasound guidance: 80 mg of Depo-Medrol and 4 mL of Marcaine injected easily.  Completed without difficulty  Pain in the anterior groin and anterior thigh immediately resolved suggesting accurate placement of the medication.  Advised to call if fevers/chills, erythema, induration, drainage, or persistent bleeding.  Images permanently stored and available for review in the ultrasound unit.  Impression: Technically successful ultrasound guided injection.    No results found for this or any previous visit (from the past 24 hour(s)). No results found.   60 year old woman with left hip pain. Diagnostic and therapeutic injection indicates a great deal of her pain is due to femoral acetabular pain. Hopefully  the Depo-Medrol will certainly help. Patient will return in 2 days for trochanteric injection. We did not perform a second steroid injection today due to concern for increased but sugars with her diabetes.

## 2016-01-21 NOTE — Patient Instructions (Signed)
Thank you for coming in today. Return Thursday for other hip injection.  Call or go to the ER if you develop a large red swollen joint with extreme pain or oozing puss.

## 2016-01-23 ENCOUNTER — Encounter: Payer: Self-pay | Admitting: Rehabilitative and Restorative Service Providers"

## 2016-01-23 ENCOUNTER — Ambulatory Visit (INDEPENDENT_AMBULATORY_CARE_PROVIDER_SITE_OTHER): Payer: 59 | Admitting: Family Medicine

## 2016-01-23 ENCOUNTER — Ambulatory Visit (INDEPENDENT_AMBULATORY_CARE_PROVIDER_SITE_OTHER): Payer: 59 | Admitting: Rehabilitative and Restorative Service Providers"

## 2016-01-23 ENCOUNTER — Encounter: Payer: Self-pay | Admitting: Family Medicine

## 2016-01-23 VITALS — BP 171/67 | HR 72 | Wt 215.0 lb

## 2016-01-23 DIAGNOSIS — M25552 Pain in left hip: Secondary | ICD-10-CM | POA: Diagnosis not present

## 2016-01-23 DIAGNOSIS — M6281 Muscle weakness (generalized): Secondary | ICD-10-CM

## 2016-01-23 DIAGNOSIS — M7062 Trochanteric bursitis, left hip: Secondary | ICD-10-CM

## 2016-01-23 MED FILL — SERTRALINE HCL 50 MG TABLET: 50 | 90 days supply | Qty: 90 | Fill #2

## 2016-01-23 MED FILL — ISOSORBIDE MN ER 30 MG TAB: 30 | 30 days supply | Qty: 15 | Fill #1

## 2016-01-23 NOTE — Therapy (Addendum)
Sarah Stevenson Stevenson Sarah Stevenson Stevenson, [place], [address] Phone: [phone]   Fax:  [phone]  Physical Therapy Treatment  Patient Details  Name: Sarah Stevenson Stevenson MRN: [id number] Date of Birth: 1955-10-18 Referring Provider: Dr Sarah Stevenson Stevenson   Encounter Date: 01/23/2016      PT End of Session - 01/23/16 1116    Visit Number 4   Number of Visits 8   Date for PT Re-Evaluation 01/29/16   PT Start Time 1106   PT Stop Time 1151   PT Time Calculation (min) 45 min   Activity Tolerance Patient tolerated treatment well      Past Medical History  Diagnosis Date  . Diabetes mellitus   . Osteoarthritis   . Hyperlipidemia   . Murmur, cardiac     Past Surgical History  Procedure Laterality Date  . Knee surgery      left arthroscopy  . Nasal sinus surgery    . Cardiac catheterization Right 11/30/2014    Procedure: RIGHT/LEFT HEART CATH AND CORONARY ANGIOGRAPHY;  Surgeon: Sarah Stevenson Crome, MD;  Location: [facility] CATH LAB;  Service: Cardiovascular;  Laterality: Right;  . Cardiac catheterization N/A 12/13/2015    Procedure: Left Heart Cath and Coronary Angiography;  Surgeon: Sarah Stevenson Man, MD;  Location: [place] CV LAB;  Service: Cardiovascular;  Laterality: N/A;  . Cardiac catheterization N/A 12/13/2015    Procedure: Intravascular Pressure Wire/FFR Study;  Surgeon: Sarah Stevenson Man, MD;  Location: [place] CV LAB;  Service: Cardiovascular;  Laterality: N/A;  RCA    There were no vitals filed for this visit.      Subjective Assessment - 01/23/16 1118    Subjective Received injection in anterior hip last week and greater tronchanter today with amazing results. She had a lot of pain just after the injection today but within 15 min she was significantly improved. Now pain free.    Currently in Pain? No/denies                         [facility] Adult PT Treatment/Exercise - 01/23/16 0001    Knee/Hip Exercises: Stretches   Passive Hamstring Stretch Left;30 seconds;3 reps   Hip Flexor Stretch 1 rep;60 seconds   Hip Flexor Stretch Limitations Lt leg off table.    Other Knee/Hip Stretches adductor stretch  with PT assist    Knee/Hip Exercises: Standing   Hip Abduction Right;Left;10 reps   Hip Extension Right;Left;10 reps   Modalities   Modalities Electrical Stimulation;Iontophoresis;Moist Heat   Moist Heat Therapy   Number Minutes Moist Heat 20 Minutes   Moist Heat Location Lumbar Spine;Hip   Electrical Stimulation   Electrical Stimulation Location Lt lumbar, hip, thigh   Electrical Stimulation Action IFC   Electrical Stimulation Parameters to tolerance   Electrical Stimulation Goals Pain;Tone   Manual Therapy   Manual Therapy Soft tissue mobilization   Joint Mobilization --  focus on L3 CPA transverse process Rt    Soft tissue mobilization Rt lumbar paraspinals; Lt hip    Passive ROM stretching into Lt hip ER/IR/extension                PT Education - 01/23/16 1147    Education provided Yes   Education Details HEP    Person(s) Educated Patient   Methods Explanation;Demonstration;Tactile cues;Verbal cues;Handout   Comprehension Verbalized understanding;Returned demonstration;Verbal cues required;Tactile cues required             PT Long Term  Goals - 01/23/16 1149    PT LONG TERM GOAL #1   Title Improve ROM/mobility through Lt hip IR =/> 20 degrees ( 524/17)    Time 4   Period Weeks   Status On-going   PT LONG TERM GOAL #2   Title Improve standing tolerance to 1 hour for work (524/17)   Time 4   Period Weeks   Status On-going   PT LONG TERM GOAL #3   Title demo strong contraction of TA and multifidi ( 01/29/16)    Time 4   Period Weeks   Status On-going   PT LONG TERM GOAL #4   Title Decrease pain =/> 75% at the end of day (01/30/16)   Time 4   Period Weeks   Status On-going   PT LONG TERM GOAL #5   Title Improve FOTO to </= 39% limitation (01/30/16)   Time 4   Period  Weeks   Status On-going               Plan - 01/23/16 1147    Clinical Impression Statement Excellent response to two injectioins in the Lt hip over the past week. She is pain free. Cautious with exercise and manual work today due to injectioin today. Patient wished to hold on TDN today to better assess response to injections.    Rehab Potential Good   PT Frequency 2x / week   PT Duration 6 weeks   PT Treatment/Interventions Patient/family education;Therapeutic exercise;Therapeutic activities;Manual techniques;Neuromuscular re-education;Dry needling;Cryotherapy;Electrical Stimulation;Moist Heat;ADLs/Self Care Home Management;Ultrasound;Taping;Iontophoresis 65m/ml Dexamethasone   PT Next Visit Plan assess response to TDN and continued ionto, possibly more spinal mobs   PT Home Exercise Plan myofacial ball work; stretching; 3 part core; TENS unit    Consulted and Agree with Plan of Care Patient      Patient will benefit from skilled therapeutic intervention in order to improve the following deficits and impairments:  Postural dysfunction, Improper body mechanics, Decreased range of motion, Decreased mobility, Decreased strength, Pain, Decreased endurance, Decreased activity tolerance  Visit Diagnosis: Pain, hip, left  Muscle weakness (generalized)     Problem List Patient Active Problem List   Diagnosis Date Noted  . Chest pain 12/13/2015  . CAD S/P percutaneous coronary angioplasty   . Trochanteric bursitis of left hip 08/14/2015  . Arthritis of left hip 08/14/2015  . Excessive daytime sleepiness 03/03/2015  . Snoring 12/20/2014  . CAD (coronary artery disease), native coronary artery 12/19/2014  . Diabetes mellitus type 2, noninsulin dependent ([place]   . Hyperlipidemia LDL goal <70   . Murmur, cardiac   . NSTEMI (non-ST elevated myocardial infarction) ([place] 11/30/2014    Sarah Stevenson Stevenson PNilda SimmerPT, MPH  01/23/2016, 11:51 AM  [facility] [address] [address]Phone: [phone]  Fax:  [phone] Name: Sarah Stevenson ORRMRN: [id number] of Birth: 1Jun 12, 1957   PHYSICAL THERAPY DISCHARGE SUMMARY  Visits from Start of Care: 4  Current functional level related to goals / functional outcomes: See progress note for discharge status   Remaining deficits: Continued intermittent symptoms - patient needs to continue with HEP    Education / Equipment: HEP Plan: Patient agrees to discharge.  Patient goals were partially met. Patient is being discharged due to not returning since the last visit.  ?????    Kameko Hukill P. HHelene KelpPT, MPH 04/24/16 9:27 AM

## 2016-01-23 NOTE — Progress Notes (Signed)
Patient presents to clinic today for previously arranged greater trochanteric injection.  Hip greater trochanteric injection: Left Consent obtained and timeout performed. Area of maximum tenderness palpated and identified. Skin cleaned with alcohol, cold spray applied. A spinal needle was used to access the greater trochanteric bursa. 80 mg of Depo-Medrol, and 4 mL of Marcaine were used to inject the trochanteric bursa. Patient tolerated the procedure well.

## 2016-01-23 NOTE — Patient Instructions (Signed)
Abdominal Bracing With Pelvic Floor (Hook-Lying)    With neutral spine, tighten pelvic floor and abdominals sucking belly button to back bone; tighten muscles in low back at waist  Hold 10 sec. Repeat _10__ times. Do _several __ times a day.   Strengthening: Hip Extension - no Resistance at this time     With tubing around right ankle, face anchor and pull leg straight back. Repeat _10___ times per set. Do __1-2__ sets per session. Do __2-3__ sessions per day.   Strengthening: Hip Abduction - no Resistance at this time     With tubing around right leg, other side toward anchor, extend leg out from side. Repeat __10__ times per set. Do _1-2___ sets per session. Do _2-3___ sessions per day.

## 2016-02-06 MED FILL — CELECOXIB 200 MG CAPSULE: 200 | 90 days supply | Qty: 90 | Fill #0

## 2016-02-06 MED FILL — EFFIENT 10 MG TABLET: 10 | 30 days supply | Qty: 30 | Fill #1

## 2016-02-06 MED FILL — ATORVASTATIN 20 MG TABLET: 20 | 90 days supply | Qty: 90 | Fill #2

## 2016-02-21 MED FILL — glipiZIDE XL 10 MG TB24: 10 | 30 days supply | Qty: 30 | Fill #1

## 2016-02-21 MED FILL — ISOSORBIDE MN ER 30 MG TAB: 30 | 30 days supply | Qty: 15 | Fill #2

## 2016-03-09 MED FILL — JANUVIA 100 MG TABLET: 100 | 90 days supply | Qty: 90 | Fill #3

## 2016-03-17 MED FILL — EFFIENT 10 MG TABLET: 10 | 30 days supply | Qty: 30 | Fill #2

## 2016-03-17 MED FILL — ISOSORBIDE MN ER 30 MG TAB: 30 | 30 days supply | Qty: 15 | Fill #3

## 2016-03-17 MED FILL — glipiZIDE XL 10 MG TB24: 10 | 30 days supply | Qty: 30 | Fill #2

## 2016-03-23 DIAGNOSIS — E1165 Type 2 diabetes mellitus with hyperglycemia: Secondary | ICD-10-CM | POA: Diagnosis not present

## 2016-03-25 DIAGNOSIS — E1165 Type 2 diabetes mellitus with hyperglycemia: Secondary | ICD-10-CM | POA: Diagnosis not present

## 2016-03-25 DIAGNOSIS — J22 Unspecified acute lower respiratory infection: Secondary | ICD-10-CM | POA: Diagnosis not present

## 2016-03-25 DIAGNOSIS — E669 Obesity, unspecified: Secondary | ICD-10-CM | POA: Diagnosis not present

## 2016-03-26 MED FILL — AZITHROMYCIN 250 MG TABLET: 250 | 5 days supply | Qty: 6 | Fill #0

## 2016-03-31 MED FILL — METFORMIN HCL ER 750 MG TAB: 750 | 90 days supply | Qty: 180 | Fill #0

## 2016-03-31 MED FILL — LOSARTAN POTASSIUM 50 MG TA: 50 | 90 days supply | Qty: 90 | Fill #3

## 2016-04-14 ENCOUNTER — Encounter (INDEPENDENT_AMBULATORY_CARE_PROVIDER_SITE_OTHER): Payer: Self-pay

## 2016-04-14 ENCOUNTER — Ambulatory Visit (INDEPENDENT_AMBULATORY_CARE_PROVIDER_SITE_OTHER): Payer: 59 | Admitting: Interventional Cardiology

## 2016-04-14 ENCOUNTER — Encounter: Payer: Self-pay | Admitting: Interventional Cardiology

## 2016-04-14 VITALS — BP 136/70 | HR 72 | Ht 63.0 in | Wt 214.2 lb

## 2016-04-14 DIAGNOSIS — E785 Hyperlipidemia, unspecified: Secondary | ICD-10-CM

## 2016-04-14 DIAGNOSIS — I2510 Atherosclerotic heart disease of native coronary artery without angina pectoris: Secondary | ICD-10-CM

## 2016-04-14 DIAGNOSIS — R0782 Intercostal pain: Secondary | ICD-10-CM | POA: Diagnosis not present

## 2016-04-14 DIAGNOSIS — I350 Nonrheumatic aortic (valve) stenosis: Secondary | ICD-10-CM | POA: Diagnosis not present

## 2016-04-14 NOTE — Progress Notes (Signed)
Cardiology Office Note    Date:  04/14/2016   ID:  Sarah Stevenson, DOB 01-Dec-1955, MRN [id number]  PCP:  Woody Seller, MD  Cardiologist: Sinclair Grooms, MD   Chief Complaint  Patient presents with  . Coronary Artery Disease    History of Present Illness:  Sarah Stevenson is a 60 y.o. female follow-up CAD with prior RCA DES and severe diagonal #2 disease by cath in 2017, aortic stenosis, and chest pain.  Since coronary stent implantation she has had a focal left mid parasternal discomfort with tingling. A cholesterol hours. It is not impacted by Imdur or nitroglycerin. There is no exertional component.  Him for similar to pre-stent discomfort. Recent hospitalization with chest pain led to cardiac catheterization which revealed widely patent stent and a tight small diagonal that was not intervened upon.    Past Medical History:  Diagnosis Date  . Diabetes mellitus   . Hyperlipidemia   . Murmur, cardiac   . Osteoarthritis     Past Surgical History:  Procedure Laterality Date  . CARDIAC CATHETERIZATION Right 11/30/2014   Procedure: RIGHT/LEFT HEART CATH AND CORONARY ANGIOGRAPHY;  Surgeon: Belva Crome, MD;  Location: [facility] CATH LAB;  Service: Cardiovascular;  Laterality: Right;  . CARDIAC CATHETERIZATION N/A 12/13/2015   Procedure: Left Heart Cath and Coronary Angiography;  Surgeon: Leonie Man, MD;  Location: [place] CV LAB;  Service: Cardiovascular;  Laterality: N/A;  . CARDIAC CATHETERIZATION N/A 12/13/2015   Procedure: Intravascular Pressure Wire/FFR Study;  Surgeon: Leonie Man, MD;  Location: [place] CV LAB;  Service: Cardiovascular;  Laterality: N/A;  RCA  . KNEE SURGERY     left arthroscopy  . NASAL SINUS SURGERY      Current Medications: Outpatient Medications Prior to Visit  Medication Sig Dispense Refill  . Acetaminophen (TYLENOL ARTHRITIS PAIN PO) Take 650 mg by mouth daily as needed. For pain    . ALPRAZolam (XANAX) 0.5 MG tablet Take 0.5  mg by mouth at bedtime as needed for anxiety.    Marland Kitchen atorvastatin (LIPITOR) 20 MG tablet Take 20 mg by mouth daily.    . carvedilol (COREG) 3.125 MG tablet TAKE 1 TABLET BY MOUTH TWICE DAILY WITH A MEAL 180 tablet 3  . celecoxib (CELEBREX) 100 MG capsule Take 100 mg by mouth daily.     . cetirizine (ZYRTEC) 10 MG tablet Take 10 mg by mouth daily.    . fluticasone (FLONASE) 50 MCG/ACT nasal spray Place 1 spray into both nostrils daily as needed for allergies. For allergies    . glipiZIDE (GLUCOTROL) 5 MG tablet Take 10 mg by mouth daily before breakfast.     . isosorbide mononitrate (IMDUR) 30 MG 24 hr tablet Take 0.5 tablets (15 mg total) by mouth daily. 30 tablet 5  . losartan (COZAAR) 25 MG tablet Take 25 mg by mouth daily.     Marland Kitchen METFORMIN HCL PO Take 1,500 mg by mouth daily.     . Multiple Vitamin (MULTIVITAMIN) capsule Take 1 capsule by mouth daily.    . nitroGLYCERIN (NITROSTAT) 0.4 MG SL tablet Place 1 tablet (0.4 mg total) under the tongue every 5 (five) minutes as needed for chest pain. 25 tablet 3  . NON FORMULARY Take 600 mg by mouth daily. TUMERIC     . oxyCODONE-acetaminophen (ROXICET) 5-325 MG tablet Take 0.5 tablets by mouth every 8 (eight) hours as needed for severe pain. 15 tablet 0  . prasugrel (EFFIENT) 10 MG TABS  tablet Take 1 tablet (10 mg total) by mouth daily. 90 tablet 3  . Ranitidine HCl (ZANTAC PO) Take 150 mg by mouth daily as needed (heartburn).     . sertraline (ZOLOFT) 50 MG tablet Take 50 mg by mouth daily.   3  . KRILL OIL PO Take 1 tablet by mouth at bedtime and may repeat dose one time if needed. Reported on 12/13/2015    . methocarbamol (ROBAXIN) 500 MG tablet Take 1 tablet (500 mg total) by mouth 3 (three) times daily. (Patient not taking: Reported on 04/14/2016) 90 tablet 0  . sitaGLIPtin (JANUVIA) 100 MG tablet Take 100 mg by mouth every evening.     . Thiamine HCl (VITAMIN B-1 PO) Take 1 tablet by mouth daily.      No facility-administered medications prior to  visit.      Allergies:   Asa [aspirin]; Blue dyes (parenteral); Corn-containing products; Nsaids; Sugar-protein-starch; Tea; Hetastarch; Isosulfan blue; and Tolmetin   Social History   Social History  . Marital status: Divorced    Spouse name: N/A  . Number of children: N/A  . Years of education: N/A   Social History Main Topics  . Smoking status: Former Research scientist (life sciences)  . Smokeless tobacco: Never Used  . Alcohol use No  . Drug use: No  . Sexual activity: Not Asked   Other Topics Concern  . None   Social History Narrative  . None     Family History:  The patient's family history includes Heart attack in her father and mother; Sudden death in her father.   ROS:   Please see the history of present illness.    Muscle aches and pains. Left per sternal chest otherwise no complaints.  All other systems reviewed and are negative.   PHYSICAL EXAM:   VS:  BP 136/70   Pulse 72   Ht '5\' 3"'$  (1.6 m)   Wt 214 lb 3.2 oz (97.2 kg)   BMI 37.94 kg/m    GEN: Well nourished, well developed, in no acute distress  HEENT: normal  Neck: no JVD, carotid bruits, or masses Cardiac: RRR; no murmurs, rubs, or gallops,no edema  Respiratory:  clear to auscultation bilaterally, normal work of breathing GI: soft, nontender, nondistended, + BS MS: no deformity or atrophy  Skin: warm and dry, no rash Neuro:  Alert and Oriented x 3, Strength and sensation are intact Psych: euthymic mood, full affect  Wt Readings from Last 3 Encounters:  04/14/16 214 lb 3.2 oz (97.2 kg)  01/23/16 215 lb (97.5 kg)  01/21/16 216 lb (98 kg)      Studies/Labs Reviewed:   EKG:  EKG  Not repeated  Recent Labs: 12/13/2015: Hemoglobin 13.4; Platelets 241 12/14/2015: BUN 15; Creatinine, Ser 0.98; Potassium 4.2; Sodium 141   Lipid Panel    Component Value Date/Time   CHOL 121 12/01/2014 0425   TRIG 121 12/01/2014 0425   HDL 25 (L) 12/01/2014 0425   CHOLHDL 4.8 12/01/2014 0425   VLDL 24 12/01/2014 0425   LDLCALC 72  12/01/2014 0425    Additional studies/ records that were reviewed today include:  Echocardiogram: 2017 ------------------------------------------------------------------- Study Conclusions  - Left ventricle: The cavity size was normal. There was mild   concentric hypertrophy. Systolic function was normal. The   estimated ejection fraction was in the range of 60% to 65%. Wall   motion was normal; there were no regional wall motion   abnormalities. Doppler parameters are consistent with abnormal   left ventricular  relaxation (grade 1 diastolic dysfunction). - Aortic valve: Valve mobility was restricted. There was moderate   stenosis. Peak velocity (S): 378 cm/s. Mean gradient (S): 32 mm   Hg. Valve area (VTI): 1.34 cm^2. - Mitral valve: There was mild regurgitation. - Left atrium: The atrium was mildly dilated.  Impressions:  - Prior aortic stenosis mean gradient 52mHg. Current 383mg.   Advanced.   ASSESSMENT:    1. Coronary artery disease involving native coronary artery of native heart without angina pectoris   2. Aortic stenosis   3. Hyperlipidemia   4. Intercostal pain      PLAN:  In order of problems listed above:  1. Discontinue Effient. 2. Progression of aortic stenosis this she had compared to last. Left ear is still moderate with a peak velocity of 3.7 m/s. We discussed cardinal symptoms associated with significant aortic stenosis including syncope, dyspnea, and angina.An echocardiogram will be repeated prior to next year's office visit. 3. LDL target is 70. Followed by primary care. 4. Precordial chest pain for which Imdur was started is not ischemic and likely musculoskeletal. Therefore a.m. during is being discontinued.    Medication Adjustments/Labs and Tests Ordered: Current medicines are reviewed at length with the patient today.  Concerns regarding medicines are outlined above.  Medication changes, Labs and Tests ordered today are listed in the Patient  Instructions below. There are no Patient Instructions on file for this visit.   Signed, HeSinclair GroomsMD  04/14/2016 12:12 PM    [email or web address]  [id number]: [phone]: [phone]

## 2016-04-14 NOTE — Patient Instructions (Addendum)
Medication Instructions:  Your physician has recommended you make the following change in your medication:  1) STOP Effient 2) STOP Imdur  Labwork: None ordered  Testing/Procedures: Your physician has requested that you have an echocardiogram. Echocardiography is a painless test that uses sound waves to create images of your heart. It provides your doctor with information about the size and shape of your heart and how well your heart's chambers and valves are working. This procedure takes approximately one hour. There are no restrictions for this procedure. ( To be scheduled in July 2018)  Follow-Up: Your physician wants you to follow-up in: 1 year with Dr.Smith You will receive a reminder letter in the mail two months in advance. If you don't receive a letter, please call our office to schedule the follow-up appointment.   Any Other Special Instructions Will Be Listed Below (If Applicable). Your physician discussed the importance of regular exercise and recommended that you start or continue a regular exercise program for good health.      If you need a refill on your cardiac medications before your next appointment, please call your pharmacy.

## 2016-04-22 MED FILL — CARVEDILOL 3.125 MG TABLET: 3.125 | 90 days supply | Qty: 180 | Fill #1

## 2016-04-22 MED FILL — glipiZIDE XL 10 MG TB24: 10 | 30 days supply | Qty: 30 | Fill #3

## 2016-04-22 MED FILL — SERTRALINE HCL 50 MG TABLET: 50 | 90 days supply | Qty: 90 | Fill #3

## 2016-05-20 MED FILL — HYDROCODON-APAP 5-325: 5-325 | 2 days supply | Qty: 30 | Fill #0

## 2016-05-20 MED FILL — CELECOXIB 200 MG CAPSULE: 200 | 90 days supply | Qty: 90 | Fill #0

## 2016-05-20 MED FILL — CHLORHEXIDINE 0.12% RINSE: 0.12 | 16 days supply | Qty: 473 | Fill #0

## 2016-06-05 MED FILL — glipiZIDE XL 10 MG TB24: 10 | 30 days supply | Qty: 30 | Fill #0

## 2016-06-05 MED FILL — ATORVASTATIN 20 MG TABLET: 20 | 90 days supply | Qty: 90 | Fill #0

## 2016-06-15 ENCOUNTER — Ambulatory Visit (INDEPENDENT_AMBULATORY_CARE_PROVIDER_SITE_OTHER): Payer: 59 | Admitting: Family Medicine

## 2016-06-15 ENCOUNTER — Encounter: Payer: Self-pay | Admitting: Family Medicine

## 2016-06-15 VITALS — BP 146/83 | HR 89 | Ht 63.0 in | Wt 212.0 lb

## 2016-06-15 DIAGNOSIS — E119 Type 2 diabetes mellitus without complications: Secondary | ICD-10-CM

## 2016-06-15 DIAGNOSIS — M1612 Unilateral primary osteoarthritis, left hip: Secondary | ICD-10-CM

## 2016-06-15 DIAGNOSIS — M7062 Trochanteric bursitis, left hip: Secondary | ICD-10-CM | POA: Diagnosis not present

## 2016-06-15 NOTE — Progress Notes (Signed)
Sarah Stevenson is a 60 y.o. female who presents to [place]: [place] today for  Follow-up left hip pain. Patient was seen last in May for hip pain. She received a joint injection as well as trochanteric bursa injection. These worked very well until the last few weeks. The pain has returned and become severe and interferes with his quality of life. She denies significant radiating pain weakness or numbness.  Past Medical History:  Diagnosis Date  . Diabetes mellitus   . Hyperlipidemia   . Murmur, cardiac   . Osteoarthritis    Past Surgical History:  Procedure Laterality Date  . CARDIAC CATHETERIZATION Right 11/30/2014   Procedure: RIGHT/LEFT HEART CATH AND CORONARY ANGIOGRAPHY;  Surgeon: Belva Crome, MD;  Location: [facility] CATH LAB;  Service: Cardiovascular;  Laterality: Right;  . CARDIAC CATHETERIZATION N/A 12/13/2015   Procedure: Left Heart Cath and Coronary Angiography;  Surgeon: Leonie Man, MD;  Location: [place] CV LAB;  Service: Cardiovascular;  Laterality: N/A;  . CARDIAC CATHETERIZATION N/A 12/13/2015   Procedure: Intravascular Pressure Wire/FFR Study;  Surgeon: Leonie Man, MD;  Location: [place] CV LAB;  Service: Cardiovascular;  Laterality: N/A;  RCA  . KNEE SURGERY     left arthroscopy  . NASAL SINUS SURGERY     Social History  Substance Use Topics  . Smoking status: Former Research scientist (life sciences)  . Smokeless tobacco: Never Used  . Alcohol use No   family history includes Heart attack in her father and mother; Sudden death in her father.  ROS as above:  Medications: Current Outpatient Prescriptions  Medication Sig Dispense Refill  . Acetaminophen (TYLENOL ARTHRITIS PAIN PO) Take 650 mg by mouth daily as needed. For pain    . ALPRAZolam (XANAX) 0.5 MG tablet Take 0.5 mg by mouth at bedtime as needed for anxiety.    Marland Kitchen atorvastatin (LIPITOR) 20 MG tablet Take  20 mg by mouth daily.    . Black Pepper-Turmeric (TURMERIC COMPLEX/BLACK PEPPER PO) Take by mouth.    . carvedilol (COREG) 3.125 MG tablet TAKE 1 TABLET BY MOUTH TWICE DAILY WITH A MEAL 180 tablet 3  . celecoxib (CELEBREX) 100 MG capsule Take 100 mg by mouth daily.     . cetirizine (ZYRTEC) 10 MG tablet Take 10 mg by mouth daily.    . fluticasone (FLONASE) 50 MCG/ACT nasal spray Place 1 spray into both nostrils daily as needed for allergies. For allergies    . glipiZIDE (GLUCOTROL) 5 MG tablet Take 10 mg by mouth daily before breakfast.     . Liraglutide (VICTOZA [place]) Inject 1.6 Units into the skin daily.    Marland Kitchen losartan (COZAAR) 25 MG tablet Take 25 mg by mouth daily.     Marland Kitchen METFORMIN HCL PO Take 1,500 mg by mouth daily.     . methocarbamol (ROBAXIN) 500 MG tablet Take 500 mg by mouth 3 (three) times daily as needed for muscle spasms.    . Multiple Vitamin (MULTIVITAMIN) capsule Take 1 capsule by mouth daily.    . nitroGLYCERIN (NITROSTAT) 0.4 MG SL tablet Place 1 tablet (0.4 mg total) under the tongue every 5 (five) minutes as needed for chest pain. 25 tablet 3  . NON FORMULARY Take 600 mg by mouth daily. TUMERIC     . oxyCODONE-acetaminophen (ROXICET) 5-325 MG tablet Take 0.5 tablets by mouth every 8 (eight) hours as needed for severe pain. 15 tablet 0  . Ranitidine HCl (ZANTAC  PO) Take 150 mg by mouth daily as needed (heartburn).     . sertraline (ZOLOFT) 50 MG tablet Take 50 mg by mouth daily.   3   No current facility-administered medications for this visit.    Allergies  Allergen Reactions  . Asa [Aspirin] Rash  . Blue Dyes (Parenteral)     rash  . Corn-Containing Products     hives  . Nsaids     rash  . Sugar-Protein-Starch     hives  . Tea     hives  . Hetastarch Rash    hives  . Isosulfan Blue Rash    rash  . Tolmetin Rash    rash     Exam:  BP (!) 146/83   Pulse 89   Ht '5\' 3"'$  (1.6 m)   Wt 212 lb (96.2 kg)   BMI 37.55 kg/m  Gen: Well NAD Left hip:  Normal-appearing nontender. Pain with rotation.  Procedure: Real-time Ultrasound Guided Injection of left femoral acetabular joint  Device: GE Logiq E  Images permanently stored and available for review in the ultrasound unit. Verbal informed consent obtained. Discussed risks and benefits of procedure. Warned about infection bleeding damage to structures skin hypopigmentation and fat atrophy among others. Patient expresses understanding and agreement Time-out conducted.  Noted no overlying erythema, induration, or other signs of local infection.  Skin prepped in a sterile fashion.  Local anesthesia: Topical Ethyl chloride.  With sterile technique and under real time ultrasound guidance: 80 mg of Depo-Medrol and 4 mL of Marcaine injected easily.  Completed without difficulty  Pain immediately resolved suggesting accurate placement of the medication.  Advised to call if fevers/chills, erythema, induration, drainage, or persistent bleeding.  Images permanently stored and available for review in the ultrasound unit.  Impression: Technically successful ultrasound guided injection.  Lot number for injection: Marcaine: [id number] Depo-Medrol: [id number]  X-ray left hip dated December 2016 reviewed showing DJD.  No results found for this or any previous visit (from the past 24 hour(s)). No results found.    Assessment and Plan: 60 y.o. female with hip pain due to both femoral acetabular DJD as well as greater trochanteric bursitis. Hip joint injection today. Will return in the next few days for trochanteric injection. Would like to avoid excessive steroid administration and the same day due to diabetes. Return sooner if needed.   No orders of the defined types were placed in this encounter.   Discussed warning signs or symptoms. Please see discharge instructions. Patient expresses understanding.

## 2016-06-15 NOTE — Patient Instructions (Signed)
Thank you for coming in today. Return in a few days for trochanteric bursa injection.  Call or go to the ER if you develop a large red swollen joint with extreme pain or oozing puss.

## 2016-06-22 ENCOUNTER — Ambulatory Visit (INDEPENDENT_AMBULATORY_CARE_PROVIDER_SITE_OTHER): Payer: 59 | Admitting: Family Medicine

## 2016-06-22 VITALS — BP 137/83 | HR 79

## 2016-06-22 DIAGNOSIS — M7062 Trochanteric bursitis, left hip: Secondary | ICD-10-CM

## 2016-06-22 DIAGNOSIS — M1612 Unilateral primary osteoarthritis, left hip: Secondary | ICD-10-CM

## 2016-06-22 MED ORDER — METHOCARBAMOL 500 MG PO TABS: 500.0000 mg | ORAL_TABLET | Freq: Three times a day (TID) | ORAL | 3 refills | Status: DC | PRN

## 2016-06-22 MED FILL — METHOCARBAMOL 500 MG TABLET: 500 | 30 days supply | Qty: 90 | Fill #0

## 2016-06-22 NOTE — Patient Instructions (Signed)
Thank you for coming in today. We will refer to Dr [name].  Return as needed.    Total Hip Replacement Total hip replacement is a surgical procedure to remove damaged bone in your hip joint and replace it with an artificial hip joint (prosthetic hip joint). The purpose of this surgery is to reduce pain and to improve your hip function.  During a total hip replacement, one or both parts of the hip joint are replaced, depending on the type of joint damage you have. The hip is a ball-and-socket type of joint, and it has two main parts. The ball part of the joint (femoral head) is the top of the thigh bone (femur). The socket part of the joint is a large indent in the side of your pelvis (acetabulum) where the femur and pelvis meet. LET [facility] CARE PROVIDER KNOW ABOUT:  Any allergies you have.  All medicines you are taking, including vitamins, herbs, eye drops, creams, and over-the-counter medicines.  Previous problems you or members of your family have had with the use of anesthetics.  Any blood disorders you have.  Previous surgeries you have had.  Medical conditions you have. RISKS AND COMPLICATIONS  Generally, total hip replacement is a safe procedure. However, problems can occur, including:  Infection.  Dislocation (the ball of the hip-joint prosthesis comes out of contact with the socket).  Loosening of the piece (stem) that connects the prosthetic femoral head to the femur.  Fracture of the bone while inserting the prosthesis.  Formation of blood clots, which can break loose and travel to and injure your lungs (pulmonary embolus). BEFORE THE PROCEDURE   Plan to have someone take you home after the procedure.  Do not eat or drink anything after midnight on the night before the procedure or as directed by your health care provider.  Ask your health care provider about:  Changing or stopping your regular medicines. This is especially important if you are taking  diabetes medicines or blood thinners.  Taking medicines such as aspirin and ibuprofen. These medicines can thin your blood. Do not take these medicines before your procedure if your health care provider asks you not to.  Ask your health care provider about how your surgical site will be marked or identified.  You may be given antibiotic medicines to help prevent infection. PROCEDURE   To reduce your risk of infection:  Your health care team will wash or sanitize their hands.  Your skin will be washed with soap.  An IV tube will be inserted into one of your veins. You will be given one or more of the following:  A medicine that makes you drowsy (sedative).  A medicine that makes you fall asleep (general anesthetic).  A medicine injected into your spine that numbs your body below the waist (spinal anesthetic).  An incision will be made in your hip. Your surgeon will take out any damaged cartilage and bone.  Your surgeon will then:  Insert a prosthetic socket into the acetabulum of your pelvis. This is usually secured with screws.  Remove the femoral head and replace it with a prosthetic ball and stem secured into the top of your femur.  Place the ball into the socket and check the range of motion and stability of your new hip.  Close the incision and apply a bandage over the surgical site. AFTER THE PROCEDURE   You will stay in a recovery area until the medicines have worn off.  Your vital signs,  such as your pulse and blood pressure, will be monitored.  Once you are awake and stable, you will be taken to a hospital room.  You may be directed to take actions to help prevent blood clots. These may include:  Walking soon after surgery, with someone assisting you. Moving around after surgery helps to improve blood flow.  Taking medicines to thin your blood (anticoagulants).  Wearing compression stockings or using different types of devices.  You will receive physical  therapy until you are doing well and your health care provider feels it is safe for you to go home.   This information is not intended to replace advice given to you by your health care provider. Make sure you discuss any questions you have with your health care provider.   Document Released: 11/30/2000 Document Revised: 05/15/2015 Document Reviewed: 10/25/2013 Elsevier Interactive Patient Education [employer].

## 2016-06-22 NOTE — Progress Notes (Signed)
Sarah Stevenson is a 60 y.o. female who presents to [place]: [place] today for follow-up left hip pain. Patient was seen last week for left hip pain. She received a interarticular left hip joint injection which helped for a day. She had severe hip pain in the groin and on the lateral aspect of her hip. She has pain with activity causing limping.   Past Medical History:  Diagnosis Date  . Diabetes mellitus   . Hyperlipidemia   . Murmur, cardiac   . Osteoarthritis    Past Surgical History:  Procedure Laterality Date  . CARDIAC CATHETERIZATION Right 11/30/2014   Procedure: RIGHT/LEFT HEART CATH AND CORONARY ANGIOGRAPHY;  Surgeon: Belva Crome, MD;  Location: [facility] CATH LAB;  Service: Cardiovascular;  Laterality: Right;  . CARDIAC CATHETERIZATION N/A 12/13/2015   Procedure: Left Heart Cath and Coronary Angiography;  Surgeon: Leonie Man, MD;  Location: [place] CV LAB;  Service: Cardiovascular;  Laterality: N/A;  . CARDIAC CATHETERIZATION N/A 12/13/2015   Procedure: Intravascular Pressure Wire/FFR Study;  Surgeon: Leonie Man, MD;  Location: [place] CV LAB;  Service: Cardiovascular;  Laterality: N/A;  RCA  . KNEE SURGERY     left arthroscopy  . NASAL SINUS SURGERY     Social History  Substance Use Topics  . Smoking status: Former Research scientist (life sciences)  . Smokeless tobacco: Never Used  . Alcohol use No   family history includes Heart attack in her father and mother; Sudden death in her father.  ROS as above:  Medications: Current Outpatient Prescriptions  Medication Sig Dispense Refill  . Acetaminophen (TYLENOL ARTHRITIS PAIN PO) Take 650 mg by mouth daily as needed. For pain    . ALPRAZolam (XANAX) 0.5 MG tablet Take 0.5 mg by mouth at bedtime as needed for anxiety.    Marland Kitchen atorvastatin (LIPITOR) 20 MG tablet Take 20 mg by mouth daily.    . Black Pepper-Turmeric (TURMERIC  COMPLEX/BLACK PEPPER PO) Take by mouth.    . carvedilol (COREG) 3.125 MG tablet TAKE 1 TABLET BY MOUTH TWICE DAILY WITH A MEAL 180 tablet 3  . celecoxib (CELEBREX) 100 MG capsule Take 100 mg by mouth daily.     . cetirizine (ZYRTEC) 10 MG tablet Take 10 mg by mouth daily.    . fluticasone (FLONASE) 50 MCG/ACT nasal spray Place 1 spray into both nostrils daily as needed for allergies. For allergies    . glipiZIDE (GLUCOTROL) 5 MG tablet Take 10 mg by mouth daily before breakfast.     . Liraglutide (VICTOZA ) Inject 1.6 Units into the skin daily.    Marland Kitchen losartan (COZAAR) 25 MG tablet Take 25 mg by mouth daily.     Marland Kitchen METFORMIN HCL PO Take 1,500 mg by mouth daily.     . methocarbamol (ROBAXIN) 500 MG tablet Take 1 tablet (500 mg total) by mouth 3 (three) times daily as needed for muscle spasms. 90 tablet 3  . Multiple Vitamin (MULTIVITAMIN) capsule Take 1 capsule by mouth daily.    . nitroGLYCERIN (NITROSTAT) 0.4 MG SL tablet Place 1 tablet (0.4 mg total) under the tongue every 5 (five) minutes as needed for chest pain. 25 tablet 3  . NON FORMULARY Take 600 mg by mouth daily. TUMERIC     . oxyCODONE-acetaminophen (ROXICET) 5-325 MG tablet Take 0.5 tablets by mouth every 8 (eight) hours as needed for severe pain. 15 tablet 0  . Ranitidine HCl (ZANTAC PO) Take  150 mg by mouth daily as needed (heartburn).     . sertraline (ZOLOFT) 50 MG tablet Take 50 mg by mouth daily.   3   No current facility-administered medications for this visit.    Allergies  Allergen Reactions  . Asa [Aspirin] Rash  . Blue Dyes (Parenteral)     rash  . Corn-Containing Products     hives  . Nsaids     rash  . Sugar-Protein-Starch     hives  . Tea     hives  . Hetastarch Rash    hives  . Isosulfan Blue Rash    rash  . Tolmetin Rash    rash    Health Maintenance Health Maintenance  Topic Date Due  . HEMOGLOBIN A1C  10[birth year]02/1955  . Hepatitis C Screening  [birth year]  . FOOT EXAM  06/09/1966  . OPHTHALMOLOGY  EXAM  06/09/1966  . HIV Screening  06/10/1971  . TETANUS/TDAP  06/10/1975  . PAP SMEAR  06/09/1977  . COLONOSCOPY  06/09/2006  . MAMMOGRAM  04/10/2011  . INFLUENZA VACCINE  04/07/2016  . ZOSTAVAX  06/09/2016  . PNEUMOCOCCAL POLYSACCHARIDE VACCINE (2) 12/01/2019     Exam:  BP 137/83   Pulse 79  Gen: Well NAD Left hip: Normal-appearing. Tender palpation greater trochanter. Pain with rotation. Limited motion due to pain.  X-ray left hip dated 08/08/2015 showing DJD reviewed  Left greater trochanter injection: Consent obtained and timeout performed. Skin cleaned with alcohol and cold spray applied. Spinal needle used to access the greater trochanter. 80 mg of Depo-Medrol and 4 mL of Marcaine were injected in a wheel pattern. Patient tolerated the procedure well.   Lot number: Depo-Medrol: [id number] Marcaine: [id number]   No results found for this or any previous visit (from the past 72 hour(s)). No results found.    Assessment and Plan: 60 y.o. female with left hip pain very likely due to hip DJD based on good initial response to intra-articular hip injection. At this time I don't think there is much point in further nonsurgical intervention. Plan to refer to orthopedic surgery for evaluation of total hip replacement.   No orders of the defined types were placed in this encounter.   Discussed warning signs or symptoms. Please see discharge instructions. Patient expresses understanding.

## 2016-06-26 DIAGNOSIS — E1165 Type 2 diabetes mellitus with hyperglycemia: Secondary | ICD-10-CM | POA: Diagnosis not present

## 2016-06-29 DIAGNOSIS — E1165 Type 2 diabetes mellitus with hyperglycemia: Secondary | ICD-10-CM | POA: Diagnosis not present

## 2016-07-06 ENCOUNTER — Ambulatory Visit (INDEPENDENT_AMBULATORY_CARE_PROVIDER_SITE_OTHER): Payer: 59 | Admitting: Orthopaedic Surgery

## 2016-07-06 ENCOUNTER — Encounter (INDEPENDENT_AMBULATORY_CARE_PROVIDER_SITE_OTHER): Payer: Self-pay | Admitting: Orthopaedic Surgery

## 2016-07-06 DIAGNOSIS — M25552 Pain in left hip: Secondary | ICD-10-CM | POA: Diagnosis not present

## 2016-07-06 DIAGNOSIS — M1612 Unilateral primary osteoarthritis, left hip: Secondary | ICD-10-CM | POA: Diagnosis not present

## 2016-07-06 NOTE — Progress Notes (Signed)
Office Visit Note   Patient: Sarah Stevenson           Date of Birth: 03-Apr-1956           MRN: [id number] Visit Date: 07/06/2016              Requested by: Gregor Hams, MD [address] [facility] [address] [place], [place] [id number] PCP: Woody Seller, MD   Assessment & Plan: Visit Diagnoses:  1. Pain of left hip joint   2. Unilateral primary osteoarthritis, left hip     Plan: I would like to see her in 4 weeks so we can see how her blood glucose is trending.  Will likely have a hemoglobin A1C drawn that day.  If she is trending in the right direction, we can set her up for a hip replacement in December.  Follow-Up Instructions: Return in about 4 weeks (around 08/03/2016).   Orders:  No orders of the defined types were placed in this encounter.  No orders of the defined types were placed in this encounter.     Procedures: No procedures performed   Clinical Data: No additional findings.   Subjective: Chief Complaint  Patient presents with  . Left Hip - Pain    Pain x"years", just worsening recently within the year. Becoming pretty constant. Has xrays on canopy. Left knee has bothered her for years aswell, unknown if this is where the hip pain started. Pain in the groin. Pain from spasms. Has had cortisone inje  Her pain is daily, she walks with a limp, and she has tried and failed all forms of conservative treatment.  She has x-rays of her left hip from a year ago already showing severe end-stage arthritis of her left hip.  She also has severe left knee arthritis.  HPI  Review of Systems   Objective: Vital Signs: There were no vitals taken for this visit.  Physical Exam  Constitutional: She is oriented to person, place, and time. She appears well-developed and well-nourished.  HENT:  Head: Normocephalic and atraumatic.  Eyes: EOM are normal. Pupils are equal, round, and reactive to light.  Neck: Normal range of motion. Neck supple.  Musculoskeletal:   Left hip: She exhibits decreased range of motion, decreased strength, tenderness and bony tenderness.       Left knee: She exhibits decreased range of motion and abnormal alignment. Tenderness found. Medial joint line tenderness noted.  Neurological: She is alert and oriented to person, place, and time.    Ortho Exam  Specialty Comments:  No specialty comments available.  Imaging: No results found.   PMFS History: Patient Active Problem List   Diagnosis Date Noted  . Chest pain 12/13/2015  . CAD S/P percutaneous coronary angioplasty   . Trochanteric bursitis of left hip 08/14/2015  . Arthritis of left hip 08/14/2015  . Excessive daytime sleepiness 03/03/2015  . Snoring 12/20/2014  . CAD (coronary artery disease), native coronary artery 12/19/2014  . Diabetes mellitus type 2, noninsulin dependent ([place])   . Hyperlipidemia   . Murmur, cardiac   . NSTEMI (non-ST elevated myocardial infarction) ([place]) 11/30/2014   Past Medical History:  Diagnosis Date  . Diabetes mellitus   . Hyperlipidemia   . Murmur, cardiac   . Osteoarthritis     Family History  Problem Relation Age of Onset  . Heart attack Mother   . Heart attack Father   . Sudden death Father   . Hypertension Neg Hx   . Hyperlipidemia  Neg Hx   . Diabetes Neg Hx     Past Surgical History:  Procedure Laterality Date  . CARDIAC CATHETERIZATION Right 11/30/2014   Procedure: RIGHT/LEFT HEART CATH AND CORONARY ANGIOGRAPHY;  Surgeon: Belva Crome, MD;  Location: [facility] CATH LAB;  Service: Cardiovascular;  Laterality: Right;  . CARDIAC CATHETERIZATION N/A 12/13/2015   Procedure: Left Heart Cath and Coronary Angiography;  Surgeon: Leonie Man, MD;  Location: [place] CV LAB;  Service: Cardiovascular;  Laterality: N/A;  . CARDIAC CATHETERIZATION N/A 12/13/2015   Procedure: Intravascular Pressure Wire/FFR Study;  Surgeon: Leonie Man, MD;  Location: [place] CV LAB;  Service: Cardiovascular;  Laterality: N/A;  RCA  .  KNEE SURGERY     left arthroscopy  . NASAL SINUS SURGERY     Social History   Occupational History  . Not on file.   Social History Main Topics  . Smoking status: Former Research scientist (life sciences)  . Smokeless tobacco: Never Used  . Alcohol use No  . Drug use: No  . Sexual activity: Not on file

## 2016-07-08 MED FILL — TRULICITY 0.75 MG/0.5 ML PE: 0.75 | 28 days supply | Qty: 2 | Fill #0

## 2016-07-09 MED FILL — LOSARTAN POTASSIUM 50 MG TA: 50 | 90 days supply | Qty: 90 | Fill #0

## 2016-07-09 MED FILL — glipiZIDE XL 10 MG TB24: 10 | 30 days supply | Qty: 30 | Fill #1

## 2016-07-28 MED FILL — oxyCODONE HCL 5 MG TABS: 5 | 20 days supply | Qty: 40 | Fill #0

## 2016-08-03 ENCOUNTER — Telehealth: Payer: Self-pay | Admitting: Interventional Cardiology

## 2016-08-03 ENCOUNTER — Ambulatory Visit (INDEPENDENT_AMBULATORY_CARE_PROVIDER_SITE_OTHER): Payer: 59 | Admitting: Orthopaedic Surgery

## 2016-08-03 DIAGNOSIS — M1612 Unilateral primary osteoarthritis, left hip: Secondary | ICD-10-CM | POA: Diagnosis not present

## 2016-08-03 DIAGNOSIS — M7062 Trochanteric bursitis, left hip: Secondary | ICD-10-CM

## 2016-08-03 NOTE — Telephone Encounter (Signed)
I spoke with the pt and she complains of 4 episodes of chest heaviness that occurred yesterday while walking at work.  The pt said the episodes lasted about 1 minute and resolved on there own when sitting down to rest. She did not use NTG and did not check vitals. She did feel her pulse and denies irregularity.  The pt did have left arm discomfort with chest heaviness but no other associated symptoms. Per the pt her symptoms were not like what she felt with her heart attack.  The pt denies any symptoms today.   Over the pt few weeks the pt has had indigestion which she related to Turmeric. She has been taking a high dose for the past few months due to osteoarthritis.  The pt has used TUMS and Pepcid with relief.  The pt saw Dr [name] today due to hip pain and has decided to post pone having surgery for a few months.   Previously the pt had been given a Rx for Imdur '30mg'$  take one-half tablet daily and she still has a supply of this at home if needed. I will discuss this pt with Dr Tamala Julian.

## 2016-08-03 NOTE — Telephone Encounter (Signed)
Pt c/o of Chest Pain: STAT if CP now or developed within 24 hours  1. Are you having CP right now? No  2. Are you experiencing any other symptoms (ex. SOB, nausea, vomiting, sweating)? No.. Experienced some left arm pain   3. How long have you been experiencing CP? Yesterday about 4 times    4. Is your CP continuous or coming and going? Continuous when happen , but after rest it stopped   5. Have you taken Nitroglycerin? No    ?

## 2016-08-03 NOTE — Telephone Encounter (Signed)
Per Dr Tamala Julian he would like the pt to continue with observation of symptoms at this time.  If the pt has further episodes he recommends checking BP and pulse and notify the office.  Per Dr Tamala Julian if episodes continue he will most likely order nuclear stress testing. Pt aware of instructions and agreed with plan.

## 2016-08-03 NOTE — Progress Notes (Signed)
[name] and is here for follow-up of her left hip. She has no trochanteric bursitis of the left hip but also severe osteophytes of that hip. She does want to consider hip replacement surgery in the future but she is having more good days and bad days right now. She still working on good blood glucose control. She's been started on any medication for this. Her hemoglobin A1c was over 10 back in early October. She sees her primary care physician in January so she wants to wait until after then the consider hip replacement surgery list things get worse.  On exam she walks with a limp. Her left hip is deathly painful with internal rotation rotation deathly hurts. I referred all of my last note some the rest visit exam and we talked in detail about what hip replacement involves. We'll see her back in early February 2018 and review her labs and.about surgery again and hopefully can get it set up at that point. Again if she is having problems before then she'll give skull

## 2016-08-05 ENCOUNTER — Telehealth: Payer: Self-pay | Admitting: Interventional Cardiology

## 2016-08-05 DIAGNOSIS — R079 Chest pain, unspecified: Secondary | ICD-10-CM

## 2016-08-05 MED ORDER — ISOSORBIDE MONONITRATE ER 30 MG PO TB24: 30.0000 mg | ORAL_TABLET | Freq: Every day | ORAL | 3 refills | Status: DC

## 2016-08-05 MED FILL — ISOSORBIDE MN ER 30 MG TAB: 30 | 90 days supply | Qty: 90 | Fill #0

## 2016-08-05 NOTE — Telephone Encounter (Signed)
Pt c/o of Chest Pain: STAT if CP now or developed within 24 hours  1. Are you having CP right now?no.  2. Are you experiencing any other symptoms (ex. SOB, nausea, vomiting, sweating)? no  3. How long have you been experiencing CP?Sunday ( Chest Pressure that radiated down her left arm)   4. Is your CP continuous or coming and going? Continuous   5. Have you taken Nitroglycerin? Yes  ?

## 2016-08-05 NOTE — Telephone Encounter (Signed)
Called stating she had 3 episodes of CP yesterday.  First episode was at 4:00 PM.  States pain doubled her over and lasted 3-4 min.  Did not take NTG.  Next episode was at 8:00 PM.  States pain more intense on scale of 6-7 and was nauseated.  Took NTG and was relieved after 2-3 min..  Third episode was an hour later and pain even  more intense and constant with nausea. States pain radiated into (L) arm and back. Rates as a 7-8 on scale of 10.  Took NTG with relief and about 3 min.  BP 142/80 HR 89. Denies any CP today. Spoke w/Dr. Tamala Julian who advises for her to start the Imdur 30 mg daily; schedule for exercise stress test ASAP and hold Coreg on AM of procedure.  Also if has recurrent pain will need to go to [facility] ER. Was able to schedule stress test for 7:30 in AM-11/30 to arrive at 7:15. Per Bonnita Hollow in Nuclear was instructed for her to eat something light, ie; toast or cereal at 5:30 in AM and take her diabetic medications. No caffeine or decaf 12 hrs prior to procedure in case has to transfer to [place]. Rx for Imdur sent to [facility]. Also added Trulicity 4.16 mg injection given on Wed per PCP. She verbalizes understanding of all instructions.

## 2016-08-06 ENCOUNTER — Encounter (INDEPENDENT_AMBULATORY_CARE_PROVIDER_SITE_OTHER): Payer: Self-pay

## 2016-08-06 ENCOUNTER — Encounter (HOSPITAL_COMMUNITY): Payer: Self-pay | Admitting: General Practice

## 2016-08-06 ENCOUNTER — Ambulatory Visit (HOSPITAL_COMMUNITY)
Admission: RE | Admit: 2016-08-06 | Discharge: 2016-08-07 | Disposition: A | Discharge status: Home / Self Care | Payer: 59 | Source: Ambulatory Visit | Attending: Cardiovascular Disease | Admitting: Cardiovascular Disease

## 2016-08-06 ENCOUNTER — Encounter (HOSPITAL_COMMUNITY)
Admission: RE | Disposition: A | Discharge status: Home / Self Care | Payer: Self-pay | Source: Ambulatory Visit | Attending: Internal Medicine

## 2016-08-06 ENCOUNTER — Ambulatory Visit (HOSPITAL_BASED_OUTPATIENT_CLINIC_OR_DEPARTMENT_OTHER): Payer: 59

## 2016-08-06 DIAGNOSIS — I2510 Atherosclerotic heart disease of native coronary artery without angina pectoris: Secondary | ICD-10-CM | POA: Diagnosis present

## 2016-08-06 DIAGNOSIS — E785 Hyperlipidemia, unspecified: Secondary | ICD-10-CM | POA: Diagnosis not present

## 2016-08-06 DIAGNOSIS — M1990 Unspecified osteoarthritis, unspecified site: Secondary | ICD-10-CM | POA: Diagnosis not present

## 2016-08-06 DIAGNOSIS — R079 Chest pain, unspecified: Secondary | ICD-10-CM

## 2016-08-06 DIAGNOSIS — R9439 Abnormal result of other cardiovascular function study: Secondary | ICD-10-CM

## 2016-08-06 DIAGNOSIS — I252 Old myocardial infarction: Secondary | ICD-10-CM | POA: Insufficient documentation

## 2016-08-06 DIAGNOSIS — E119 Type 2 diabetes mellitus without complications: Secondary | ICD-10-CM | POA: Insufficient documentation

## 2016-08-06 DIAGNOSIS — Z8249 Family history of ischemic heart disease and other diseases of the circulatory system: Secondary | ICD-10-CM | POA: Diagnosis not present

## 2016-08-06 DIAGNOSIS — Z888 Allergy status to other drugs, medicaments and biological substances status: Secondary | ICD-10-CM | POA: Insufficient documentation

## 2016-08-06 DIAGNOSIS — I350 Nonrheumatic aortic (valve) stenosis: Secondary | ICD-10-CM | POA: Insufficient documentation

## 2016-08-06 DIAGNOSIS — I2583 Coronary atherosclerosis due to lipid rich plaque: Secondary | ICD-10-CM | POA: Diagnosis not present

## 2016-08-06 DIAGNOSIS — Z87891 Personal history of nicotine dependence: Secondary | ICD-10-CM | POA: Diagnosis not present

## 2016-08-06 DIAGNOSIS — I200 Unstable angina: Secondary | ICD-10-CM

## 2016-08-06 DIAGNOSIS — I25110 Atherosclerotic heart disease of native coronary artery with unstable angina pectoris: Secondary | ICD-10-CM | POA: Diagnosis not present

## 2016-08-06 DIAGNOSIS — Z955 Presence of coronary angioplasty implant and graft: Secondary | ICD-10-CM

## 2016-08-06 DIAGNOSIS — Z7984 Long term (current) use of oral hypoglycemic drugs: Secondary | ICD-10-CM | POA: Insufficient documentation

## 2016-08-06 HISTORY — PX: CARDIAC CATHETERIZATION: SHX172

## 2016-08-06 HISTORY — DX: Acute myocardial infarction, unspecified: I21.9

## 2016-08-06 HISTORY — DX: Angina pectoris, unspecified: I20.9

## 2016-08-06 HISTORY — DX: Depression, unspecified: F32.A

## 2016-08-06 HISTORY — DX: Major depressive disorder, single episode, unspecified: F32.9

## 2016-08-06 HISTORY — DX: Atherosclerotic heart disease of native coronary artery without angina pectoris: I25.10

## 2016-08-06 HISTORY — PX: CORONARY ANGIOPLASTY WITH STENT PLACEMENT: SHX49

## 2016-08-06 HISTORY — DX: Type 2 diabetes mellitus without complications: E11.9

## 2016-08-06 LAB — MYOCARDIAL PERFUSION IMAGING
CHL CUP MPHR: 160 {beats}/min
CHL CUP NUCLEAR SRS: 0
CSEPEDS: 16 s
CSEPPHR: 134 {beats}/min
Estimated workload: 5.6 METS
Exercise duration (min): 4 min
LHR: 0.26
LV dias vol: 97 mL (ref 46–106)
LVSYSVOL: 45 mL
NUC STRESS TID: 1.09
Percent HR: 83 %
Rest HR: 75 {beats}/min
SDS: 7
SSS: 7

## 2016-08-06 LAB — GLUCOSE, CAPILLARY
GLUCOSE-CAPILLARY: 89 mg/dL (ref 65–99)
Glucose-Capillary: 126 mg/dL — ABNORMAL HIGH (ref 65–99)
Glucose-Capillary: 87 mg/dL (ref 65–99)

## 2016-08-06 LAB — BASIC METABOLIC PANEL
Anion gap: 9 (ref 5–15)
BUN: 12 mg/dL (ref 6–20)
CALCIUM: 9.1 mg/dL (ref 8.9–10.3)
CO2: 26 mmol/L (ref 22–32)
Chloride: 104 mmol/L (ref 101–111)
Creatinine, Ser: 0.91 mg/dL (ref 0.44–1.00)
GFR calc Af Amer: >60 mL/min (ref >60)
GLUCOSE: 145 mg/dL — AB (ref 65–99)
POTASSIUM: 4 mmol/L (ref 3.5–5.1)
Sodium: 139 mmol/L (ref 135–145)

## 2016-08-06 LAB — CBC
HEMATOCRIT: 37.3 % (ref 36.0–46.0)
Hemoglobin: 12.7 g/dL (ref 12.0–15.0)
MCH: 32.2 pg (ref 26.0–34.0)
MCHC: 34 g/dL (ref 30.0–36.0)
MCV: 94.7 fL (ref 78.0–100.0)
Platelets: 220 10*3/uL (ref 150–400)
RBC: 3.94 MIL/uL (ref 3.87–5.11)
RDW: 12.8 % (ref 11.5–15.5)
WBC: 7.8 10*3/uL (ref 4.0–10.5)

## 2016-08-06 LAB — PROTIME-INR
INR: 1.01
Prothrombin Time: 13.3 seconds (ref 11.4–15.2)

## 2016-08-06 SURGERY — RIGHT/LEFT HEART CATH AND CORONARY ANGIOGRAPHY

## 2016-08-06 MED ORDER — ISOSORBIDE MONONITRATE ER 30 MG PO TB24
30.0000 mg | ORAL_TABLET | Freq: Every day | ORAL | Status: DC
  Administered 2016-08-06 – 2016-08-07 (×2): 30 mg via ORAL
  Filled 2016-08-06 (×2): qty 1

## 2016-08-06 MED ORDER — PRASUGREL HCL 10 MG PO TABS
ORAL_TABLET | ORAL | Status: DC | PRN
  Administered 2016-08-06: 60 mg via ORAL

## 2016-08-06 MED ORDER — PRASUGREL HCL 10 MG PO TABS
ORAL_TABLET | ORAL | Status: AC
  Filled 2016-08-06: qty 1

## 2016-08-06 MED ORDER — METHOCARBAMOL 500 MG PO TABS: 500.0000 mg | ORAL_TABLET | Freq: Three times a day (TID) | ORAL | Status: DC | PRN

## 2016-08-06 MED ORDER — HEPARIN (PORCINE) IN NACL 2-0.9 UNIT/ML-% IJ SOLN
INTRAMUSCULAR | Status: AC
  Filled 2016-08-06: qty 1000

## 2016-08-06 MED ORDER — SODIUM CHLORIDE 0.9 % IV SOLN: 250.0000 mL | INTRAVENOUS | Status: DC | PRN

## 2016-08-06 MED ORDER — LIDOCAINE HCL (PF) 1 % IJ SOLN
INTRAMUSCULAR | Status: AC
  Filled 2016-08-06: qty 30

## 2016-08-06 MED ORDER — TECHNETIUM TC 99M TETROFOSMIN IV KIT
10.9000 | PACK | Freq: Once | INTRAVENOUS | Status: AC | PRN
  Administered 2016-08-06: 10.9 via INTRAVENOUS
  Filled 2016-08-06: qty 11

## 2016-08-06 MED ORDER — MIDAZOLAM HCL 2 MG/2ML IJ SOLN
INTRAMUSCULAR | Status: AC
  Filled 2016-08-06: qty 2

## 2016-08-06 MED ORDER — LOSARTAN POTASSIUM 25 MG PO TABS: 25.0000 mg | ORAL_TABLET | Freq: Every day | ORAL | Status: DC

## 2016-08-06 MED ORDER — ASPIRIN 81 MG PO CHEW: 81.0000 mg | CHEWABLE_TABLET | ORAL | Status: DC

## 2016-08-06 MED ORDER — ALPRAZOLAM 0.25 MG PO TABS: 0.5000 mg | ORAL_TABLET | Freq: Every evening | ORAL | Status: DC | PRN

## 2016-08-06 MED ORDER — FENTANYL CITRATE (PF) 100 MCG/2ML IJ SOLN
INTRAMUSCULAR | Status: DC | PRN
  Administered 2016-08-06: 25 ug via INTRAVENOUS

## 2016-08-06 MED ORDER — ACETAMINOPHEN 325 MG PO TABS: 650.0000 mg | ORAL_TABLET | ORAL | Status: DC | PRN

## 2016-08-06 MED ORDER — SODIUM CHLORIDE 0.9 % IV SOLN
INTRAVENOUS | Status: AC
  Administered 2016-08-06: 16:00:00 via INTRAVENOUS

## 2016-08-06 MED ORDER — SODIUM CHLORIDE 0.9% FLUSH: 3.0000 mL | INTRAVENOUS | Status: DC | PRN

## 2016-08-06 MED ORDER — CARVEDILOL 3.125 MG PO TABS
3.1250 mg | ORAL_TABLET | Freq: Two times a day (BID) | ORAL | Status: DC
  Administered 2016-08-06 – 2016-08-07 (×2): 3.125 mg via ORAL
  Filled 2016-08-06 (×2): qty 1

## 2016-08-06 MED ORDER — SERTRALINE HCL 50 MG PO TABS: 50.0000 mg | ORAL_TABLET | Freq: Every day | ORAL | Status: DC

## 2016-08-06 MED ORDER — CELECOXIB 200 MG PO CAPS
200.0000 mg | ORAL_CAPSULE | Freq: Every day | ORAL | Status: DC
  Administered 2016-08-07: 09:00:00 200 mg via ORAL
  Filled 2016-08-06: qty 1

## 2016-08-06 MED ORDER — ANGIOPLASTY BOOK
Freq: Once | Status: AC
  Administered 2016-08-06: 20:00:00
  Filled 2016-08-06: qty 1

## 2016-08-06 MED ORDER — HYDRALAZINE HCL 20 MG/ML IJ SOLN: 5.0000 mg | INTRAMUSCULAR | Status: AC | PRN

## 2016-08-06 MED ORDER — ONDANSETRON HCL 4 MG/2ML IJ SOLN
4.0000 mg | Freq: Four times a day (QID) | INTRAMUSCULAR | Status: DC | PRN
  Administered 2016-08-06: 22:00:00 4 mg via INTRAVENOUS
  Filled 2016-08-06: qty 2

## 2016-08-06 MED ORDER — HYDRALAZINE HCL 20 MG/ML IJ SOLN
10.0000 mg | INTRAMUSCULAR | Status: DC | PRN
  Administered 2016-08-06: 17:00:00 10 mg via INTRAVENOUS
  Filled 2016-08-06: qty 1

## 2016-08-06 MED ORDER — GLIPIZIDE 10 MG PO TABS: 10.0000 mg | ORAL_TABLET | Freq: Every day | ORAL | Status: DC

## 2016-08-06 MED ORDER — PRASUGREL HCL 10 MG PO TABS
10.0000 mg | ORAL_TABLET | Freq: Every day | ORAL | Status: DC
  Administered 2016-08-07: 10 mg via ORAL
  Filled 2016-08-06: qty 1

## 2016-08-06 MED ORDER — NITROGLYCERIN 0.4 MG SL SUBL: 0.4000 mg | SUBLINGUAL_TABLET | SUBLINGUAL | Status: DC | PRN

## 2016-08-06 MED ORDER — GLIPIZIDE ER 5 MG PO TB24
10.0000 mg | ORAL_TABLET | Freq: Every day | ORAL | Status: DC
  Administered 2016-08-07: 10 mg via ORAL
  Filled 2016-08-06: qty 2

## 2016-08-06 MED ORDER — FLUTICASONE PROPIONATE 50 MCG/ACT NA SUSP: 1.0000 | Freq: Every day | NASAL | Status: DC | PRN

## 2016-08-06 MED ORDER — SODIUM CHLORIDE 0.9% FLUSH: 3.0000 mL | Freq: Two times a day (BID) | INTRAVENOUS | Status: DC

## 2016-08-06 MED ORDER — ALPRAZOLAM 0.5 MG PO TABS
0.5000 mg | ORAL_TABLET | Freq: Every evening | ORAL | Status: DC | PRN
  Administered 2016-08-06: 20:00:00 0.5 mg via ORAL
  Filled 2016-08-06: qty 1

## 2016-08-06 MED ORDER — SODIUM CHLORIDE 0.9 % WEIGHT BASED INFUSION: 1.0000 mL/kg/h | INTRAVENOUS | Status: DC

## 2016-08-06 MED ORDER — FENTANYL CITRATE (PF) 100 MCG/2ML IJ SOLN
INTRAMUSCULAR | Status: AC
  Filled 2016-08-06: qty 2

## 2016-08-06 MED ORDER — SODIUM CHLORIDE 0.9 % IV SOLN
1.7500 mg/kg/h | INTRAVENOUS | Status: AC
  Administered 2016-08-06: 1.75 mg/kg/h via INTRAVENOUS
  Filled 2016-08-06 (×2): qty 250

## 2016-08-06 MED ORDER — SODIUM CHLORIDE 0.9 % IV SOLN
INTRAVENOUS | Status: DC | PRN
  Administered 2016-08-06 (×2): 1.75 mg/kg/h via INTRAVENOUS

## 2016-08-06 MED ORDER — ATORVASTATIN CALCIUM 20 MG PO TABS: 20.0000 mg | ORAL_TABLET | Freq: Every day | ORAL | Status: DC

## 2016-08-06 MED ORDER — FAMOTIDINE 20 MG PO TABS: 20.0000 mg | ORAL_TABLET | Freq: Every day | ORAL | Status: DC

## 2016-08-06 MED ORDER — BIVALIRUDIN 250 MG IV SOLR
INTRAVENOUS | Status: AC
  Filled 2016-08-06: qty 250

## 2016-08-06 MED ORDER — SODIUM CHLORIDE 0.9 % WEIGHT BASED INFUSION
3.0000 mL/kg/h | INTRAVENOUS | Status: DC
  Administered 2016-08-06: 3 mL/kg/h via INTRAVENOUS

## 2016-08-06 MED ORDER — LIDOCAINE HCL (PF) 1 % IJ SOLN
INTRAMUSCULAR | Status: DC | PRN
  Administered 2016-08-06: 25 mL via INTRADERMAL

## 2016-08-06 MED ORDER — OXYCODONE-ACETAMINOPHEN 5-325 MG PO TABS: 0.5000 | ORAL_TABLET | Freq: Three times a day (TID) | ORAL | Status: DC | PRN

## 2016-08-06 MED ORDER — LABETALOL HCL 5 MG/ML IV SOLN
10.0000 mg | INTRAVENOUS | Status: AC | PRN
  Administered 2016-08-06: 10 mg via INTRAVENOUS
  Filled 2016-08-06: qty 4

## 2016-08-06 MED ORDER — ACETAMINOPHEN 325 MG PO TABS: 325.0000 mg | ORAL_TABLET | Freq: Four times a day (QID) | ORAL | Status: DC | PRN

## 2016-08-06 MED ORDER — ADULT MULTIVITAMIN W/MINERALS CH
1.0000 | ORAL_TABLET | Freq: Every day | ORAL | Status: DC
  Administered 2016-08-07: 09:00:00 1 via ORAL
  Filled 2016-08-06: qty 1

## 2016-08-06 MED ORDER — MIDAZOLAM HCL 2 MG/2ML IJ SOLN
INTRAMUSCULAR | Status: DC | PRN
  Administered 2016-08-06: 1 mg via INTRAVENOUS

## 2016-08-06 MED ORDER — BIVALIRUDIN BOLUS VIA INFUSION - CUPID
INTRAVENOUS | Status: DC | PRN
  Administered 2016-08-06: 69.75 mg via INTRAVENOUS

## 2016-08-06 MED ORDER — IOPAMIDOL (ISOVUE-370) INJECTION 76%
INTRAVENOUS | Status: AC
  Filled 2016-08-06: qty 100

## 2016-08-06 MED ORDER — TECHNETIUM TC 99M TETROFOSMIN IV KIT
31.5000 | PACK | Freq: Once | INTRAVENOUS | Status: AC | PRN
  Administered 2016-08-06: 31.5 via INTRAVENOUS
  Filled 2016-08-06: qty 32

## 2016-08-06 MED ORDER — HEPARIN (PORCINE) IN NACL 2-0.9 UNIT/ML-% IJ SOLN
INTRAMUSCULAR | Status: DC | PRN
  Administered 2016-08-06: 1000 mL via INTRA_ARTERIAL

## 2016-08-06 MED ORDER — IOPAMIDOL (ISOVUE-370) INJECTION 76%
INTRAVENOUS | Status: DC | PRN
  Administered 2016-08-06: 80 mL via INTRA_ARTERIAL

## 2016-08-06 MED ORDER — SERTRALINE HCL 50 MG PO TABS
50.0000 mg | ORAL_TABLET | Freq: Every day | ORAL | Status: DC
  Administered 2016-08-07: 50 mg via ORAL
  Filled 2016-08-06 (×2): qty 1

## 2016-08-06 SURGICAL SUPPLY — 21 items
BALLN EUPHORA RX 2.0X12 (BALLOONS) ×2
BALLN ~~LOC~~ TREK RX 3.25X12 (BALLOONS) ×2
BALLOON EUPHORA RX 2.0X12 (BALLOONS) IMPLANT
BALLOON ~~LOC~~ TREK RX 3.25X12 (BALLOONS) IMPLANT
CATH INFINITI 5FR MULTPACK ANG (CATHETERS) ×1 IMPLANT
CATH SWAN GANZ 7F STRAIGHT (CATHETERS) ×1 IMPLANT
GUIDE CATH RUNWAY 6FR FR4 (CATHETERS) ×1 IMPLANT
GUIDE CATH RUNWAY 6FR FR4 SH (CATHETERS) ×1 IMPLANT
KIT ENCORE 26 ADVANTAGE (KITS) ×1 IMPLANT
KIT HEART LEFT (KITS) ×2 IMPLANT
PACK CARDIAC CATHETERIZATION (CUSTOM PROCEDURE TRAY) ×2 IMPLANT
SHEATH PINNACLE 5F 10CM (SHEATH) IMPLANT
SHEATH PINNACLE 6F 10CM (SHEATH) ×1 IMPLANT
SHEATH PINNACLE 7F 10CM (SHEATH) ×1 IMPLANT
STENT SYNERGY DES 3X16 (Permanent Stent) ×1 IMPLANT
STENT SYNERGY DES 3X8 (Permanent Stent) ×1 IMPLANT
TRANSDUCER W/STOPCOCK (MISCELLANEOUS) ×4 IMPLANT
TUBING CIL FLEX 10 FLL-RA (TUBING) ×2 IMPLANT
WIRE ASAHI PROWATER 180CM (WIRE) ×1 IMPLANT
WIRE EMERALD 3MM-J .035X150CM (WIRE) ×1 IMPLANT
WIRE EMERALD ST .035X150CM (WIRE) ×1 IMPLANT

## 2016-08-06 NOTE — Progress Notes (Signed)
Patient received from cath lab at 1615 in no distress.  At 1745 called to room for SOB and titeness in throat.  VSS, oxygen sats wnl.  Patient repositioned, oxygen 2L via [place] applied for comfort.  Lungs clear, diminished throughout.  EKG showed no changes.  Made Harlan Stains, NP aware and requested she examine medication list.  Patient given prn meds for HTN.  Feeling better at shift change (1900); Harlan Stains seeing patient at this time.

## 2016-08-06 NOTE — Interval H&P Note (Signed)
Cath Lab Visit (complete for each Cath Lab visit)  Clinical Evaluation Leading to the Procedure:   ACS: Yes.    Non-ACS:    Anginal Classification: CCS IV  Anti-ischemic medical therapy: Maximal Therapy (2 or more classes of medications)  Non-Invasive Test Results: Intermediate-risk stress test findings: cardiac mortality 1-3%/year  Prior CABG: No previous CABG      History and Physical Interval Note:  08/06/2016 2:45 PM  Sarah Stevenson  has presented today for surgery, with the diagnosis of cp - unstable angina - aortic stenosis  The various methods of treatment have been discussed with the patient and family. After consideration of risks, benefits and other options for treatment, the patient has consented to  Procedure(s): Right/Left Heart Cath and Coronary Angiography (N/A) as a surgical intervention .  The patient's history has been reviewed, patient examined, no change in status, stable for surgery.  I have reviewed the patient's chart and labs.  Questions were answered to the patient's satisfaction.     Quay Burow

## 2016-08-06 NOTE — Progress Notes (Signed)
Site area: right groin  Site Prior to Removal:  Level 0  Pressure Applied For 20 MINUTES    Minutes Beginning at 21:40  Manual:   Yes.    Patient Status During Pull: Pt c/o nauseous. Felt better after Zofran IV.  Post Pull Groin Site:  Level 0  Post Pull Instructions Given:  Yes.    Post Pull Pulses Present:  Yes.    Dressing Applied:  Yes.    Comments:  Pt tolerated well.

## 2016-08-06 NOTE — H&P (Signed)
Sarah Stevenson is a 60 y.o. female  Admit Date: (Not on file) Referring Physician: H WB Blenda Bridegroom, M.D. 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 She had a non-ST elevation myocardial infarction of March 2016 and was treated with a drug-eluting stent to the right coronary. She had residual 50-70% stenosis in the LAD. She again presented in April 2017 with chest discomfort. At that time there was 60% stenosis distal to the right coronary stent. FFR was 0.84. LAD stenosis appeared to be somewhat less severe than previous. She has known moderate to severe aortic stenosis with last echocardiogram demonstrating a velocity of 3.8 m per sec. In April 2017.  Is in this setting that she now presents with a one-week history of exertional chest pressure. Episodes are responsive to nitroglycerin. Because of her recent heart catheterization, I decided to perform a stress nuclear study today. On the nuclear study there were EKG changes of ischemia, recurrent chest discomfort, and images are pending. With these findings, I have discussed with her proceeding with coronary angiography to define anatomy and treat likely progression of coronary disease.  PMH:    Past Medical History:  Diagnosis Date  . Diabetes mellitus   . Hyperlipidemia   . Murmur, cardiac   . Osteoarthritis     PSH:    Past Surgical History:  Procedure Laterality Date  . CARDIAC CATHETERIZATION Right 11/30/2014   Procedure: RIGHT/LEFT HEART CATH AND CORONARY ANGIOGRAPHY;  Surgeon: Belva Crome, MD;  Location: [facility] CATH LAB;  Service: Cardiovascular;  Laterality: Right;  . CARDIAC CATHETERIZATION N/A 12/13/2015   Procedure: Left Heart Cath and Coronary Angiography;  Surgeon: Leonie Man, MD;  Location: [facility] CV LAB;  Service: Cardiovascular;  Laterality: N/A;  . CARDIAC CATHETERIZATION N/A  12/13/2015   Procedure: Intravascular Pressure Wire/FFR Study;  Surgeon: Leonie Man, MD;  Location: [facility] CV LAB;  Service: Cardiovascular;  Laterality: N/A;  RCA  . KNEE SURGERY     left arthroscopy  . NASAL SINUS SURGERY     ALLERGIES:   Asa [aspirin]; Blue dyes (parenteral); Corn-containing products; Nsaids; Sugar-protein-starch; Tea; Hetastarch; Isosulfan blue; and Tolmetin Prior to Admit Meds:   No prescriptions prior to admission.   Family HX:    Family History  Problem Relation Age of Onset  . Heart attack Mother   . Heart attack Father   . Sudden death Father   . Hypertension Neg Hx   . Hyperlipidemia Neg Hx   . Diabetes Neg Hx    Social HX:    Social History   Social History  . Marital status: Divorced    Spouse name: N/A  . Number of children: N/A  . Years of education: N/A   Occupational History  . Not on file.   Social History Main Topics  . Smoking status: Former Research scientist (life sciences)  . Smokeless tobacco: Never Used  . Alcohol use No  . Drug use: No  . Sexual activity: Not on file   Other Topics Concern  . Not on file   Social History Narrative  . No narrative on file     ROS Pulmonary disease with intermittent wheezing and dyspnea. All other systems are negative.  Physical Exam: There were no vitals taken for this visit.  138/90 mmHg. Heart rate 80 bpm.   The patient appears anxious. She is examined immediately after  stress testing. The neck veins are not distended. Carotid upstroke is hyperdynamic. No JVD is noted. HEENT exam reveals pupils that are equal and reactive. No pallor is noted. Chest is clear to auscultation and percussion. Cardiac exam reveals a grade 4/6 crescendo decrescendo murmur of aortic stenosis. An S4 gallop is audible. The murmurs heard at the apex. No significant radiation to the axilla. Abdomen is obese but nontender. Bowel sounds are normal. Extremities reveal no peripheral edema. Radial pulses are 2+ and symmetric. Lower  extremities reveal posterior tibial pulses bilaterally. Neuro exam Reveals no abnormalities.  Labs: Lab Results  Component Value Date   WBC 9.0 12/13/2015   HGB 13.4 12/13/2015   HCT 39.4 12/13/2015   MCV 94.3 12/13/2015   PLT 241 12/13/2015   No results for input(s): NA, K, CL, CO2, BUN, CREATININE, CALCIUM, PROT, BILITOT, ALKPHOS, ALT, AST, GLUCOSE in the last 168 hours.  Invalid input(s): LABALBU Lab Results  Component Value Date   TROPONINI <0.03 12/13/2015     Radiology: Nuclear imaging from today is pending.  EKG:  Resting EKG prior to exercise testing reveals sinus rhythm without significant ischemic change. She did have ST abnormality noted at peak exercise.  ASSESSMENT:  1. Crescendo angina pectoris in this 60 year old female with history prior RCA DES, 60% stenosis distal to the stent noted on cath 6 months ago, and intermediate stenosis in the LAD. I'm concerned that she may have progression of restenosis in the right coronary stent. It is also possible that aortic stenosis is now playing a role in her presentation.  Perfusion imaging is pending at the time of this dictation. 2. Moderate to severe aortic stenosis. Last evaluation was 7 months ago. We will reassess with right heart cath.   Plan:  1. Under the circumstances I have recommended right and left heart catheterization with coronary angiography and possible PCI if needed. Given the relatively new onset of these complaints, the cat should be done as soon as possible. We will plan to have it performed later today if possible.  The patient was counseled to undergo left heart catheterization, coronary angiography, and possible percutaneous coronary intervention with stent implantation. The procedural risks and benefits were discussed in detail. The risks discussed included death, stroke, myocardial infarction, life-threatening bleeding, limb ischemia, kidney injury, allergy, and possible emergency cardiac surgery. The  risk of these significant complications were estimated to occur less than 1% of the time. After discussion, the patient has agreed to proceed.  Belva Crome III 08/06/2016 10:37 AM

## 2016-08-06 NOTE — Progress Notes (Signed)
Called to see patient regarding c/o of some shortness of breath and throat tightness. Appears some of her home medications were not ordered on admission. Reordered home Xanax. EKG done with no changes. Vital signs stable, but hypertensive. Given PRN hydralazine with improvement. On arrival to the room, patient reported she was feeling better, less anxious. RN to monitor and page if changes noted.

## 2016-08-07 ENCOUNTER — Encounter (HOSPITAL_COMMUNITY): Payer: Self-pay | Admitting: Cardiovascular Disease

## 2016-08-07 DIAGNOSIS — M1990 Unspecified osteoarthritis, unspecified site: Secondary | ICD-10-CM | POA: Diagnosis not present

## 2016-08-07 DIAGNOSIS — E119 Type 2 diabetes mellitus without complications: Secondary | ICD-10-CM | POA: Diagnosis not present

## 2016-08-07 DIAGNOSIS — I350 Nonrheumatic aortic (valve) stenosis: Secondary | ICD-10-CM

## 2016-08-07 DIAGNOSIS — R9439 Abnormal result of other cardiovascular function study: Secondary | ICD-10-CM | POA: Diagnosis not present

## 2016-08-07 DIAGNOSIS — Z888 Allergy status to other drugs, medicaments and biological substances status: Secondary | ICD-10-CM | POA: Diagnosis not present

## 2016-08-07 DIAGNOSIS — E785 Hyperlipidemia, unspecified: Secondary | ICD-10-CM | POA: Diagnosis not present

## 2016-08-07 DIAGNOSIS — I2583 Coronary atherosclerosis due to lipid rich plaque: Secondary | ICD-10-CM | POA: Diagnosis not present

## 2016-08-07 DIAGNOSIS — I25110 Atherosclerotic heart disease of native coronary artery with unstable angina pectoris: Secondary | ICD-10-CM | POA: Diagnosis not present

## 2016-08-07 DIAGNOSIS — I252 Old myocardial infarction: Secondary | ICD-10-CM | POA: Diagnosis not present

## 2016-08-07 DIAGNOSIS — Z87891 Personal history of nicotine dependence: Secondary | ICD-10-CM | POA: Diagnosis not present

## 2016-08-07 LAB — POCT I-STAT 3, VENOUS BLOOD GAS (G3P V)
Bicarbonate: 25.3 mmol/L (ref 20.0–28.0)
O2 Saturation: 65 %
PH VEN: 7.375 (ref 7.250–7.430)
TCO2: 27 mmol/L (ref 0–100)
pCO2, Ven: 43.3 mmHg — ABNORMAL LOW (ref 44.0–60.0)
pO2, Ven: 35 mmHg (ref 32.0–45.0)

## 2016-08-07 LAB — CBC
HEMATOCRIT: 33 % — AB (ref 36.0–46.0)
HEMOGLOBIN: 11 g/dL — AB (ref 12.0–15.0)
MCH: 31.8 pg (ref 26.0–34.0)
MCHC: 33.3 g/dL (ref 30.0–36.0)
MCV: 95.4 fL (ref 78.0–100.0)
Platelets: 207 10*3/uL (ref 150–400)
RBC: 3.46 MIL/uL — AB (ref 3.87–5.11)
RDW: 13 % (ref 11.5–15.5)
WBC: 7.9 10*3/uL (ref 4.0–10.5)

## 2016-08-07 LAB — BASIC METABOLIC PANEL
ANION GAP: 7 (ref 5–15)
BUN: 9 mg/dL (ref 6–20)
CHLORIDE: 107 mmol/L (ref 101–111)
CO2: 26 mmol/L (ref 22–32)
Calcium: 8.6 mg/dL — ABNORMAL LOW (ref 8.9–10.3)
Creatinine, Ser: 0.92 mg/dL (ref 0.44–1.00)
GFR calc non Af Amer: >60 mL/min (ref >60)
GLUCOSE: 153 mg/dL — AB (ref 65–99)
POTASSIUM: 4.2 mmol/L (ref 3.5–5.1)
Sodium: 140 mmol/L (ref 135–145)

## 2016-08-07 LAB — POCT ACTIVATED CLOTTING TIME: Activated Clotting Time: 373 seconds

## 2016-08-07 LAB — POCT I-STAT 3, ART BLOOD GAS (G3+)
Acid-base deficit: 1 mmol/L (ref 0.0–2.0)
BICARBONATE: 24.2 mmol/L (ref 20.0–28.0)
O2 Saturation: 91 %
PCO2 ART: 39 mmHg (ref 32.0–48.0)
PH ART: 7.4 (ref 7.350–7.450)
PO2 ART: 60 mmHg — AB (ref 83.0–108.0)
TCO2: 25 mmol/L (ref 0–100)

## 2016-08-07 LAB — GLUCOSE, CAPILLARY: Glucose-Capillary: 143 mg/dL — ABNORMAL HIGH (ref 65–99)

## 2016-08-07 MED ORDER — ATORVASTATIN CALCIUM 40 MG PO TABS: 40.0000 mg | ORAL_TABLET | Freq: Every day | ORAL | 12 refills | Status: DC

## 2016-08-07 MED ORDER — PRASUGREL HCL 10 MG PO TABS: 10.0000 mg | ORAL_TABLET | Freq: Every day | ORAL | 12 refills | Status: DC

## 2016-08-07 MED FILL — PRASUGREL 10 MG TABLET: 10 | 30 days supply | Qty: 30 | Fill #0

## 2016-08-07 MED FILL — ATORVASTATIN 40 MG TABLET: 40 | 90 days supply | Qty: 90 | Fill #0

## 2016-08-07 NOTE — Discharge Summary (Signed)
Discharge Summary    Patient ID: Sarah Stevenson,  MRN: [id number], DOB/AGE: [birth year] 60 y.o.  Admit date: 08/06/2016 Discharge date: 08/07/2016  Primary Care Provider: Woody Seller Primary Cardiologist: Dr. Tamala Julian   Discharge Diagnoses    Active Problems:   CAD (coronary artery disease), native coronary artery   Positive cardiac stress test   Aortic stenosis, moderate   Allergies Allergies  Allergen Reactions  . Asa [Aspirin] Rash  . Blue Dyes (Parenteral) Rash  . Corn-Containing Products Hives  . Hetastarch Hives and Rash  . Isosulfan Blue Rash  . Nsaids Rash  . Sugar-Protein-Starch Hives  . Tea Hives  . Tolmetin Rash    Diagnostic Studies/Procedures    Coronary Stent Intervention  Right/Left Heart Cath and Coronary Angiography 08/06/16    Prox RCA to Mid RCA lesion, 0 %stenosed.  Prox LAD to Mid LAD lesion, 60 %stenosed.  Hemodynamic findings consistent with aortic valve stenosis.  Mid RCA lesion, 95 %stenosed.  Post intervention, there is a 0% residual stenosis.  A stent was successfully placed.    IMPRESSION: Sarah Stevenson had a tight distal, RCA which was stented with Synergy drug-eluting stents. She has mild to at most moderate aortic stenosis with a valve area of 1.33 cm2 and a peak gradient of 35 mmHg. Intramaxillary continued for 4 hours for this and discontinued. She is to be removed and pressure held. The patient will be treated with Effient alone because of aspirin allergy. She will be discharged home in the morning. 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 She had a non-ST elevation myocardial infarction of March 2016 and was treated with a drug-eluting stent to the right coronary. She had residual 50-70% stenosis in the LAD.   She again presented in April 2017 with chest discomfort. At that time there was 60% stenosis distal to the right  coronary stent. FFR was 0.84. LAD stenosis appeared to be somewhat less severe than previous. She has known moderate to severe aortic stenosis with last echocardiogram demonstrating a velocity of 3.8 m per sec. In April 2017.  She presented to Dr. Thompson Caul office on 08/06/16 with a one week history of exertional chest pressure. Nuclear stress test was done was in the office and she had EKG changes and recurrent chest discomfort. She was referred for heart cath.   Hospital Course     She underwent left heart cath yesterday, report above.  She had a mid to distal RCA lesion that was successfully treated with DES x 2. Her aortic valve peak gradient was 35 mmHg. She is allergic to ASA, so she will be treated with Effient alone.   She mentioned needed a hip replacement at some point, Dr. Martinique informed her that she is not a candidate for elective surgery for at least 6 months, preferably one year.   She will resume her metformin on Sunday morning, patient informed of this.   Her right femoral site was stable without hematoma.    _____________  Discharge Vitals Blood pressure 121/60, pulse 82, temperature 97 F (36.1 C), temperature source Axillary, resp. rate (!) 21, height '5\' 3"'$  (1.6 m), weight 212 lb 11.9 oz (96.5 kg), SpO2 92 %.  Filed Weights   08/06/16 1054 08/07/16 0343  Weight: 205 lb (93 kg) 212 lb 11.9 oz (96.5 kg)    Labs & Radiologic Studies     CBC  Recent Labs  08/06/16 1133 08/07/16 0318  WBC 7.8 7.9  HGB 12.7 11.0*  HCT 37.3 33.0*  MCV 94.7 95.4  PLT 220 130   Basic Metabolic Panel  Recent Labs  08/06/16 1133 08/07/16 0318  NA 139 140  K 4.0 4.2  CL 104 107  CO2 26 26  GLUCOSE 145* 153*  BUN 12 9  CREATININE 0.91 0.92  CALCIUM 9.1 8.6*     Disposition   Pt is being discharged home today in good condition.  Follow-up Plans & Appointments    Follow-up Information    Lyda Jester, PA-C Follow up on 08/21/2016.   Specialties:  Cardiology,  Radiology Why:  at 10:30 am for hospital follow up Contact information: [place] [address] [phone]          Discharge Instructions    AMB Referral to Cardiac Rehabilitation - Phase II    Complete by:  As directed    Diagnosis:  Coronary Stents   Amb Referral to Cardiac Rehabilitation    Complete by:  As directed    Diagnosis:  Coronary Stents   Diet - low sodium heart healthy    Complete by:  As directed    Discharge instructions    Complete by:  As directed    Resume your Metformin on Sunday morning (08/09/16)   Increase activity slowly    Complete by:  As directed       Discharge Medications   Current Discharge Medication List    START taking these medications   Details  prasugrel (EFFIENT) 10 MG TABS tablet Take 1 tablet (10 mg total) by mouth daily. Qty: 30 tablet, Refills: 12      CONTINUE these medications which have CHANGED   Details  atorvastatin (LIPITOR) 40 MG tablet Take 1 tablet (40 mg total) by mouth daily. Qty: 30 tablet, Refills: 12      CONTINUE these medications which have NOT CHANGED   Details  Acetaminophen (TYLENOL ARTHRITIS PAIN PO) Take 650 mg by mouth daily as needed (for pain). For pain    carvedilol (COREG) 3.125 MG tablet TAKE 1 TABLET BY MOUTH TWICE DAILY WITH A MEAL Qty: 180 tablet, Refills: 3    cetirizine (ZYRTEC) 10 MG tablet Take 10 mg by mouth daily.    Dulaglutide (TRULICITY) 1.32 GM/0.1UU SOPN Inject 0.75 mg into the skin every Wednesday.     glipiZIDE (GLUCOTROL XL) 10 MG 24 hr tablet Take 10 mg by mouth daily.    isosorbide mononitrate (IMDUR) 30 MG 24 hr tablet Take 1 tablet (30 mg total) by mouth daily. Qty: 90 tablet, Refills: 3    losartan (COZAAR) 50 MG tablet Take 50 mg by mouth daily.     Menthol-Methyl Salicylate (MUSCLE RUB EX) Apply 1 application topically as needed (for pain).    metFORMIN (GLUCOPHAGE) 500 MG tablet Take 1,500 mg by mouth at bedtime.     methocarbamol  (ROBAXIN) 500 MG tablet Take 1 tablet (500 mg total) by mouth 3 (three) times daily as needed for muscle spasms. Qty: 90 tablet, Refills: 3    Multiple Vitamin (MULTIVITAMIN) capsule Take 1 capsule by mouth daily.    nitroGLYCERIN (NITROSTAT) 0.4 MG SL tablet Place 1 tablet (0.4 mg total) under the tongue every 5 (five) minutes as needed for chest pain. Qty: 25 tablet, Refills: 3    ranitidine (ZANTAC) 150 MG tablet Take 150 mg by mouth daily as needed (heartburn).     sertraline (  ZOLOFT) 25 MG tablet Take 25 mg by mouth daily.  Refills: 3    ALPRAZolam (XANAX) 0.5 MG tablet Take 0.5 mg by mouth at bedtime as needed for anxiety.    fluticasone (FLONASE) 50 MCG/ACT nasal spray Place 1 spray into both nostrils daily as needed for allergies. For allergies    oxyCODONE-acetaminophen (ROXICET) 5-325 MG tablet Take 0.5 tablets by mouth every 8 (eight) hours as needed for severe pain. Qty: 15 tablet, Refills: 0      STOP taking these medications     acetaminophen (TYLENOL) 325 MG tablet      celecoxib (CELEBREX) 200 MG capsule          Aspirin prescribed at discharge?  No: She is allergic to ASA High Intensity Statin Prescribed? (Lipitor 40-'80mg'$  or Crestor 20-'40mg'$ ): Yes Beta Blocker Prescribed? Yes For EF 40% or less, Was ACEI/ARB Prescribed? No: EF normal  ADP Receptor Inhibitor Prescribed? (i.e. Plavix etc.-Includes Medically Managed Patients): Yes For EF <40%, Aldosterone Inhibitor Prescribed? No: EF normal  Was EF assessed during THIS hospitalization? No: Recent Echo  Was Cardiac Rehab II ordered? (Included Medically managed Patients): Yes   Outstanding Labs/Studies     Duration of Discharge Encounter   Greater than 30 minutes including physician time.  Signed, Arbutus Leas NP 08/07/2016, 9:25 AM

## 2016-08-07 NOTE — Progress Notes (Signed)
Effient        Pt copay will be $10 with coupon-prior Josem Kaufmann is not required

## 2016-08-07 NOTE — Progress Notes (Signed)
CARDIAC REHAB PHASE I   PRE:  Rate/Rhythm: 109 ST  BP:  Sitting: 121/57        SaO2: 98 RA  MODE:  Ambulation: 700 ft   POST:  Rate/Rhythm: 107 ST  BP:  Sitting: 119/49         SaO2: 98 RA  Pt ambulated 700 ft on RA, handheld assist, steady gait, tolerated well.  Pt c/o mild DOE, chronic hip pain, denies cp, dizziness, declined rest stop. Completed PCI/stent education.  Reviewed risk factors, anti-platelet therapy, stent card, activity restrictions, ntg, exercise, heart healthy diet, carb counting, and phase 2 cardiac rehab. Pt verbalized understanding, receptive to information. Pt states she has been under a great deal of personal stress this past year, emotional support given to pt. Pt agrees to phase 2 cardiac rehab referral, will send to [facility] per pt request. Pt to chair after walk, up ad lib in room, call bell within reach.    0800-0900 Lenna Sciara, RN, BSN 08/07/2016 8:56 AM

## 2016-08-07 NOTE — Progress Notes (Signed)
Patient Name: Sarah Stevenson Date of Encounter: 08/07/2016  Primary Cardiologist: Sarah Schick MD  Hospital Problem List     Active Problems:   CAD (coronary artery disease), native coronary artery   Positive cardiac stress test     Subjective   Feels well this am. Did note some throat tightness post procedure but this has resolved.  Inpatient Medications    Scheduled Meds: . carvedilol  3.125 mg Oral BID WC  . celecoxib  200 mg Oral Daily  . glipiZIDE  10 mg Oral Daily  . isosorbide mononitrate  30 mg Oral Daily  . multivitamin with minerals  1 tablet Oral Daily  . prasugrel  10 mg Oral Daily  . sertraline  50 mg Oral Daily  . sodium chloride flush  3 mL Intravenous Q12H   Continuous Infusions:  PRN Meds: sodium chloride, acetaminophen, acetaminophen, ALPRAZolam, hydrALAZINE, ondansetron (ZOFRAN) IV, oxyCODONE-acetaminophen, sodium chloride flush   Vital Signs    Vitals:   08/06/16 2200 08/06/16 2203 08/06/16 2205 08/07/16 0343  BP: (!) 89/37 (!) 105/41 (!) 92/53 121/60  Pulse: 79 76 74 82  Resp: (!) 21 (!) 21 16 (!) 21  Temp:    97 F (36.1 C)  TempSrc:    Axillary  SpO2: 97% 97% 98% 92%  Weight:    212 lb 11.9 oz (96.5 kg)  Height:        Intake/Output Summary (Last 24 hours) at 08/07/16 0738 Last data filed at 08/07/16 0206  Gross per 24 hour  Intake              870 ml  Output             1200 ml  Net             -330 ml   Filed Weights   08/06/16 1054 08/07/16 0343  Weight: 205 lb (93 kg) 212 lb 11.9 oz (96.5 kg)    Physical Exam    GEN: Well nourished, overweight, in no acute distress.  HEENT: Grossly normal.  Neck: Supple, no JVD, carotid bruits, or masses. Cardiac: RRR, no murmurs, rubs, or gallops. No clubbing, cyanosis, edema.  Radials/DP/PT 2+ and equal bilaterally. No right groin hematoma.  Respiratory:  Respirations regular and unlabored, clear to auscultation bilaterally. GI: Soft, nontender, nondistended, BS + x 4. Sarah: no  deformity or atrophy. Skin: warm and dry, no rash. Neuro:  Strength and sensation are intact. Psych: AAOx3.  Normal affect.  Labs    CBC  Recent Labs  08/06/16 1133 08/07/16 0318  WBC 7.8 7.9  HGB 12.7 11.0*  HCT 37.3 33.0*  MCV 94.7 95.4  PLT 220 573   Basic Metabolic Panel  Recent Labs  08/06/16 1133 08/07/16 0318  NA 139 140  K 4.0 4.2  CL 104 107  CO2 26 26  GLUCOSE 145* 153*  BUN 12 9  CREATININE 0.91 0.92  CALCIUM 9.1 8.6*   Liver Function Tests No results for input(s): AST, ALT, ALKPHOS, BILITOT, PROT, ALBUMIN in the last 72 hours. No results for input(s): LIPASE, AMYLASE in the last 72 hours. Cardiac Enzymes No results for input(s): CKTOTAL, CKMB, CKMBINDEX, TROPONINI in the last 72 hours. BNP Invalid input(s): POCBNP D-Dimer No results for input(s): DDIMER in the last 72 hours. Hemoglobin A1C No results for input(s): HGBA1C in the last 72 hours. Fasting Lipid Panel No results for input(s): CHOL, HDL, LDLCALC, TRIG, CHOLHDL, LDLDIRECT in the last 72 hours. Thyroid Function Tests No  results for input(s): TSH, T4TOTAL, T3FREE, THYROIDAB in the last 72 hours.  Invalid input(s): FREET3  Telemetry    NSR - Personally Reviewed  ECG    NSR, no acute changes. - Personally Reviewed  Radiology    No results found.  Cardiac Studies   Echo 01/02/16:Study Conclusions  - Left ventricle: The cavity size was normal. There was mild   concentric hypertrophy. Systolic function was normal. The   estimated ejection fraction was in the range of 60% to 65%. Wall   motion was normal; there were no regional wall motion   abnormalities. Doppler parameters are consistent with abnormal   left ventricular relaxation (grade 1 diastolic dysfunction). - Aortic valve: Valve mobility was restricted. There was moderate   stenosis. Peak velocity (S): 378 cm/s. Mean gradient (S): 32 mm   Hg. Valve area (VTI): 1.34 cm^2. - Mitral valve: There was mild regurgitation. -  Left atrium: The atrium was mildly dilated.  Impressions:  - Prior aortic stenosis mean gradient 5mHg. Current 362mg.   Advanced.  Myoview 08/07/16:Study Highlights     Nuclear stress EF: 54%.  Blood pressure demonstrated a hypotensive response to exercise.  Downsloping ST segment depression ST segment depression of 1 mm in the V6 leads, beginning at early recovery, and returning to baseline after 1-5 minutes of recovery.  Defect 1: There is a large defect of moderate severity present in the basal inferoseptal, basal inferior, basal inferolateral, mid inferoseptal, mid inferior and apical inferior location.  Findings consistent with ischemia.  This is a high risk study.  The left ventricular ejection fraction is mildly decreased (45-54%).   There is a large area, moderate severity, reversible defect in the basal inferoseptal, inferior, inferolateral, mid inferoseptal, inferior and apical inferior walls consistent with ischemia in the RCA territory. (SDS = 8)     Procedures   Coronary Stent Intervention  Right/Left Heart Cath and Coronary Angiography  Conclusion     Prox RCA to Mid RCA lesion, 0 %stenosed.  Prox LAD to Mid LAD lesion, 60 %stenosed.  Hemodynamic findings consistent with aortic valve stenosis.  Mid RCA lesion, 95 %stenosed.  Post intervention, there is a 0% residual stenosis.  A stent was successfully placed.   Sarah Stevenson a 60.o. female    [id number]:  [facility]: Sarah BurowM.D. [id number] DATE OF PROCEDURE:  08/06/2016  DATE OF DISCHARGE:     CARDIAC CATHETERIZATION / PCI    History obtained from chart review.  Sarah Stevenson a 60ear old female with a history of diabetes and hyperlipidemia who suffered an inferior STEMI March 2016 and had a drug-eluting stent placed. She developed chest pain and underwent cardiac catheterization by Dr. HaEllyn Stevenson 2017 revealing widely  patent RCA stent, 60% distal RCA lesion which was not found to be physiologically significant by FFR (0.84) and moderate proximal LAD disease. She does have mild to moderate aortic stenosis. She was treated medically. She's had accelerated angina over the last few days [facility] Stress test showed ischemia in the RCA territory. She presents now for right left heart cath to define her anatomy and physiology.   PROCEDURE DESCRIPTION:   The patient was brought to the second floor [facility] cath lab in the postabsorptive state. She was premedicated with Valium 5 mg by mouth, IV Versed and fentanyl. Her right groin was prepped and shaved in usual  sterile fashion. Xylocaine 1% was used for local anesthesia. A 6 French sheath was inserted into the right common femoral  artery using standard Seldinger technique. A 7 French sheath was inserted into the right common femoral vein. A 7 [place] balloontipped thermodilution Swan-Ganz catheter was then advanced through the right heart chambers obtaining sequential pressures and let samples for the determination of Fick and thermal dilution cardiac output. 5 French right and left Judkins diagnostic catheters were used for selective coronary angiography and for crossing the aortic valve to obtain a pullback pressure. I was used for the entirety of the case. Retrograde aortic, left ventricular and pullback pressures were recorded.  The patient received Angiomax bolus followed by infusion as well as 60 mg of Effient by mouth. I didn't office 6 [place] JR4 and exchanged for a sidehole catheter. Using a 6 French sidehole JR4, a 0.14 cm Pro water guidewire and a 2 mm balloon the distal RCA lesion was predilated. I then placed a 3 mm x 16 mm Synergy drug-eluting stent across the lesion and deployed at 16 atm. There was a small "cath" between the previously placed stent and the current stent  and I therefore placed a short 3 mm x 8 mm Synergy drug-eluting stent across the unstented segment and post dilated the entire stented segment with a 3.25 mm x 12 mm long noncompliant balloon at 16 atm (3.33 mm) resulting reduction in 95-90% distal dominant RCA stenosis to 0% residual. The patient tolerated the procedure well. The guidewire and catheter were removed and sheaths were secured. The patient left the lab in stable condition.   IMPRESSION: Sarah. Kindel had a tight distal, RCA which was stented with Synergy drug-eluting stents. She has mild to at most moderate aortic stenosis with a valve area of 1.33 cm2 and a peak gradient of 35 mmHg. Intramaxillary continued for 4 hours for this and discontinued. She is to be removed and pressure held. The patient will be treated with Effient alone because of aspirin allergy. She will be discharged home in the morning.  Quay Stevenson. MD, [facility] 08/06/2016 4:03 PM      Indications   Coronary artery disease due to lipid rich plaque [I25.10, I25.83 (ICD-10-CM)]  Aortic stenosis, moderate [I35.0 (ICD-10-CM)]  Unstable angina ([place]) [I20.0 (ICD-10-CM)]  Procedural Details/Technique   Technical Details Estimated blood loss <50 mL.  During this procedure the patient was administered the following to achieve and maintain moderate conscious sedation: Versed 1 mg, Fentanyl 25 mcg, while the patient's heart rate, blood pressure, and oxygen saturation were continuously monitored. The period of conscious sedation was 50 minutes, of which I was present face-to-face 100% of this time.    Coronary Findings   Dominance: Right  Left Anterior Descending  Prox LAD to Mid LAD lesion, 60% stenosed.  Right Coronary Artery  Prox RCA to Mid RCA lesion, 0% stenosed. Prox RCA to Mid RCA lesion with no stenosis was previously treated.  Mid RCA lesion, 95% stenosed.  Angioplasty: A stent was successfully placed.  There is no residual stenosis post intervention.    Right Heart   Right Heart Pressures Hemodynamic findings consistent with aortic valve stenosis. RA pressure-8/5 RV pressure-30/3 Pulmonary artery pressure-29/10, mean 18 Primary wedge pressure-10/8, mean 7 Peak aortic valve gradient 35 mmHg Aortic valve area-1.33 cm2 Cardiac output 5.77 L/m by Richrd Sox 6.46 L/m by thermodilution    Coronary Diagrams   Diagnostic Diagram     Post-Intervention Diagram        Patient  Profile     60 yo WF with moderate AS, CAD s/p stenting of RCA in April 2017. Presents with unstable angina and markedly abnormal Myoview with inferior ischemia.  Assessment & Plan    1. Unstable angina. S/p DES x 2 of mid to distal RCA. On Effient monotherapy due to history of ASA allergy. Also intolerant of Brilinta due to dyspnea. Doing well this am. VSS. No angina. Interested in outpatient cardiac Rehab. Plan to DC home today.  2. Moderate Aortic stenosis by cath and Echo. Will need to be followed by serial Echo.  3. Osteoarthritis. Mentioned needing hip replacement at some point. Informed not a candidate for elective surgery for at least 6 months and preferably one year.  4. HTN controlled 5. DM- hold metformin for 48 hours. Continue other meds.   Signed, Mccormick Macon Martinique, MD  08/07/2016, 7:38 AM

## 2016-08-07 NOTE — Care Management Note (Signed)
Case Management Note  Patient Details  Name: Sarah Stevenson MRN: [id number] Date of Birth: [birth year]  Subjective/Objective:  S/p intervention, will be on effient, co pay is $10, she goes to [employer] and they have in stock.  pta indep, she has a pcp, medication coverage and transportation.  She is for dc today.                  Action/Plan:   Expected Discharge Date:                  Expected Discharge Plan:  Home/Self Care  In-House Referral:     Discharge planning Services  CM Consult  Post Acute Care Choice:    Choice offered to:     DME Arranged:    DME Agency:     HH Arranged:    HH Agency:     Status of Service:  Completed, signed off  If discussed at [employer] of Stay Meetings, dates discussed:    Additional Comments:  Zenon Mayo, RN 08/07/2016, 9:29 AM

## 2016-08-10 MED FILL — CARVEDILOL 3.125 MG TABLET: 3.125 | 90 days supply | Qty: 180 | Fill #2

## 2016-08-10 MED FILL — CELECOXIB 200 MG CAPSULE: 200 | 90 days supply | Qty: 90 | Fill #0

## 2016-08-10 MED FILL — SERTRALINE HCL 50 MG TABLET: 50 | 90 days supply | Qty: 90 | Fill #0

## 2016-08-10 MED FILL — glipiZIDE XL 10 MG TB24: 10 | 30 days supply | Qty: 30 | Fill #2

## 2016-08-10 MED FILL — METFORMIN HCL ER 750 MG TAB: 750 | 90 days supply | Qty: 180 | Fill #0

## 2016-08-21 ENCOUNTER — Encounter: Payer: Self-pay | Admitting: Cardiology

## 2016-08-21 ENCOUNTER — Ambulatory Visit (INDEPENDENT_AMBULATORY_CARE_PROVIDER_SITE_OTHER): Payer: 59 | Admitting: Cardiology

## 2016-08-21 ENCOUNTER — Encounter (INDEPENDENT_AMBULATORY_CARE_PROVIDER_SITE_OTHER): Payer: Self-pay

## 2016-08-21 VITALS — BP 128/58 | HR 64 | Ht 63.0 in | Wt 209.5 lb

## 2016-08-21 DIAGNOSIS — I25110 Atherosclerotic heart disease of native coronary artery with unstable angina pectoris: Secondary | ICD-10-CM

## 2016-08-21 MED ORDER — NITROGLYCERIN 0.4 MG SL SUBL: 0.4000 mg | SUBLINGUAL_TABLET | SUBLINGUAL | 3 refills | Status: DC | PRN

## 2016-08-21 NOTE — Patient Instructions (Signed)
Medication Instructions:  Your physician recommends that you continue on your current medications as directed. Please refer to the Current Medication list given to you today. 1. Nitroglycerin was sent in today to patient's requested pharmacy.   Labwork: -None  Testing/Procedures: -None  Follow-Up: Your physician recommends that you keep your scheduled  follow-up appointment in March with Dr. Tamala Julian.    Any Other Special Instructions Will Be Listed Below (If Applicable).     If you need a refill on your cardiac medications before your next appointment, please call your pharmacy.

## 2016-08-21 NOTE — Progress Notes (Signed)
08/21/2016 Sarah Stevenson   10/31/1955  [id number]  Primary Physician Woody Seller, MD Primary Cardiologist: Dr. 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 She had a non-ST elevation myocardial infarction of March 2016 and was treated with a drug-eluting stent to the right coronary. She had residual 50-70% stenosis in the LAD.   She again presented in April 2017 with chest discomfort. At that time there was 60% stenosis distal to the right coronary stent. FFR was 0.84. LAD stenosis appeared to be somewhat less severe than previous. She has known moderate to severe aortic stenosis with last echocardiogram demonstrating a velocity of 3.45mper sec. In April 2017.  She presented to Dr. SThompson Cauloffice on 08/06/16 with a one week history of exertional chest pressure. Nuclear stress test was done was in the office and she had EKG changes and recurrent chest discomfort. She was referred for heart cath.   She underwent left heart cath on 11/30.  She had a mid to distal RCA lesion that was successfully treated with DES x 2. Her aortic valve peak gradient was 35 mmHg. She is allergic to ASA, so she will be treated with Effient alone.    She presents back to clinic today for post hospital f/u. She has done well since discharge. No recurrent CP. No dyspnea. She is compliant with her medications. HR and BP are both stable. No post cath complications.      Current Meds  Medication Sig  . Acetaminophen (TYLENOL ARTHRITIS PAIN PO) Take 650 mg by mouth daily as needed (for pain). For pain  . ALPRAZolam (XANAX) 0.5 MG tablet Take 0.5 mg by mouth at bedtime as needed for anxiety.  .Marland Kitchenatorvastatin (LIPITOR) 40 MG tablet Take 1 tablet (40 mg total) by mouth daily.  . Black Pepper-Turmeric (TURMERIC COMPLEX/BLACK PEPPER) 3-500 MG CAPS Take 2 capsules by mouth daily.  . carvedilol  (COREG) 3.125 MG tablet TAKE 1 TABLET BY MOUTH TWICE DAILY WITH A MEAL (Patient taking differently: Take 3.125 mg by mouth 2 (two) times daily with a meal. )  . cetirizine (ZYRTEC) 10 MG tablet Take 10 mg by mouth daily.  . Cyanocobalamin (VITAMIN B-12) 500 MCG SUBL Place 1 each under the tongue daily.  . Dulaglutide (TRULICITY) 05.42MHC/6.2BJSOPN Inject 0.75 mg into the skin every Wednesday.   . fluticasone (FLONASE) 50 MCG/ACT nasal spray Place 1 spray into both nostrils daily as needed for allergies. For allergies  . glipiZIDE (GLUCOTROL XL) 10 MG 24 hr tablet Take 10 mg by mouth daily.  . isosorbide mononitrate (IMDUR) 30 MG 24 hr tablet Take 1 tablet (30 mg total) by mouth daily.  .Marland Kitchenlosartan (COZAAR) 50 MG tablet Take 50 mg by mouth daily.   . Menthol-Methyl Salicylate (MUSCLE RUB EX) Apply 1 application topically as needed (for pain).  . metFORMIN (GLUCOPHAGE) 500 MG tablet Take 1,500 mg by mouth at bedtime.   . methocarbamol (ROBAXIN) 500 MG tablet Take 1 tablet (500 mg total) by mouth 3 (three) times daily as needed for muscle spasms. (Patient taking differently: Take 250-500 mg by mouth 3 (three) times daily as needed for muscle spasms. )  . Multiple Vitamin (MULTIVITAMIN) capsule Take 1 capsule by mouth daily.  . nitroGLYCERIN (NITROSTAT) 0.4 MG SL tablet Place 1 tablet (0.4 mg total) under the tongue every 5 (five) minutes as  needed for chest pain.  Marland Kitchen oxyCODONE-acetaminophen (ROXICET) 5-325 MG tablet Take 0.5 tablets by mouth every 8 (eight) hours as needed for severe pain.  . prasugrel (EFFIENT) 10 MG TABS tablet Take 1 tablet (10 mg total) by mouth daily.  . ranitidine (ZANTAC) 150 MG tablet Take 150 mg by mouth daily as needed (heartburn).   . sertraline (ZOLOFT) 25 MG tablet Take 25 mg by mouth daily.   . [DISCONTINUED] nitroGLYCERIN (NITROSTAT) 0.4 MG SL tablet Place 1 tablet (0.4 mg total) under the tongue every 5 (five) minutes as needed for chest pain.   Allergies  Allergen  Reactions  . Asa [Aspirin] Rash  . Blue Dyes (Parenteral) Rash  . Corn-Containing Products Hives  . Hetastarch Hives and Rash  . Isosulfan Blue Rash  . Nsaids Rash  . Sugar-Protein-Starch Hives  . Tea Hives   Past Medical History:  Diagnosis Date  . Anginal pain ([place])   . Coronary artery disease   . Depression    "mild" (08/06/2016)  . Hyperlipidemia   . Murmur, cardiac   . Myocardial infarction 2016  . Osteoarthritis    "left hip and knee" (08/06/2016)  . Type II diabetes mellitus ([facility])    Family History  Problem Relation Age of Onset  . Heart attack Mother   . Heart attack Father   . Sudden death Father   . Hypertension Neg Hx   . Hyperlipidemia Neg Hx   . Diabetes Neg Hx    Past Surgical History:  Procedure Laterality Date  . CARDIAC CATHETERIZATION Right 11/30/2014   Procedure: RIGHT/LEFT HEART CATH AND CORONARY ANGIOGRAPHY;  Surgeon: Belva Crome, MD;  Location: [facility] CATH LAB;  Service: Cardiovascular;  Laterality: Right;  . CARDIAC CATHETERIZATION N/A 12/13/2015   Procedure: Left Heart Cath and Coronary Angiography;  Surgeon: Leonie Man, MD;  Location: [place] CV LAB;  Service: Cardiovascular;  Laterality: N/A;  . CARDIAC CATHETERIZATION N/A 12/13/2015   Procedure: Intravascular Pressure Wire/FFR Study;  Surgeon: Leonie Man, MD;  Location: [place] CV LAB;  Service: Cardiovascular;  Laterality: N/A;  RCA  . CARDIAC CATHETERIZATION N/A 08/06/2016   Procedure: Right/Left Heart Cath and Coronary Angiography;  Surgeon: Lorretta Harp, MD;  Location: [place] CV LAB;  Service: Cardiovascular;  Laterality: N/A;  . CARDIAC CATHETERIZATION N/A 08/06/2016   Procedure: Coronary Stent Intervention;  Surgeon: Lorretta Harp, MD;  Location: [place] CV LAB;  Service: Cardiovascular;  Laterality: N/A;   distal rca 3.0x16 and 3.0x8 synergy  . CESAREAN SECTION  1989  . CORONARY ANGIOPLASTY WITH STENT PLACEMENT  08/06/2016   "2 stents"  . HYSTEROSCOPY  DIAGNOSTIC  1990s  . KNEE ARTHROSCOPY Left 1996  . NASAL SINUS SURGERY  1977   Social History   Social History  . Marital status: Divorced    Spouse name: N/A  . Number of children: N/A  . Years of education: N/A   Occupational History  . Not on file.   Social History Main Topics  . Smoking status: Former Smoker    Packs/day: 1.00    Years: 10.00    Types: Cigarettes    Quit date: 67  . Smokeless tobacco: Never Used  . Alcohol use 4.2 oz/week    7 Shots of liquor per week  . Drug use: No  . Sexual activity: No   Other Topics Concern  . Not on file   Social History Narrative  . No narrative on file     Review  of Systems: General: negative for chills, fever, night sweats or weight changes.  Cardiovascular: negative for chest pain, dyspnea on exertion, edema, orthopnea, palpitations, paroxysmal nocturnal dyspnea or shortness of breath Dermatological: negative for rash Respiratory: negative for cough or wheezing Urologic: negative for hematuria Abdominal: negative for nausea, vomiting, diarrhea, bright red blood per rectum, melena, or hematemesis Neurologic: negative for visual changes, syncope, or dizziness All other systems reviewed and are otherwise negative except as noted above.   Physical Exam:  Blood pressure (!) 128/58, pulse 64, height '5\' 3"'$  (1.6 m), weight 209 lb 8 oz (95 kg).  General appearance: alert, cooperative and no distress Neck: no carotid bruit and no JVD Lungs: clear to auscultation bilaterally Heart: regular rate and rhythm, S1, S2 normal, no murmur, click, rub or gallop Extremities: extremities normal, atraumatic, no cyanosis or edema Pulses: 2+ and symmetric Skin: Skin color, texture, turgor normal. No rashes or lesions Neurologic: Grossly normal  EKG NSR. 64 bpm. No ischemia.   ASSESSMENT AND PLAN:   1. CAD: per above, s/p recent stenting of mid to distal RCA. No recurrent CP. No ASA given allergy. Continue monotherapy with Effient.  Continue Imdur, statin and ARB.   2. Aortic Stenosis: Moderate. Peak gradient was 35 mmHg during cath. This is being followed by Dr. Tamala Julian. She is asymptomatic.   PLAN  F/u with Dr. Tamala Julian in 2-3 months   Lyda Jester PA-C 08/21/2016 12:25 PM

## 2016-09-02 MED FILL — TRULICITY 0.75 MG/0.5 ML PE: 0.75 | 28 days supply | Qty: 2 | Fill #1

## 2016-09-02 MED FILL — PRASUGREL 10 MG TABLET: 10 | 30 days supply | Qty: 30 | Fill #1

## 2016-09-03 ENCOUNTER — Telehealth (INDEPENDENT_AMBULATORY_CARE_PROVIDER_SITE_OTHER): Payer: Self-pay | Admitting: Orthopaedic Surgery

## 2016-09-03 NOTE — Telephone Encounter (Signed)
Find out what she wants.  I have offered to schedule her for a hip replacement and she wanted to wait, which I understand.  If things are worsening, she may want to have it done.

## 2016-09-03 NOTE — Telephone Encounter (Signed)
Would like you to call her to discuss

## 2016-09-09 MED FILL — glipiZIDE XL 10 MG TB24: 10 | 30 days supply | Qty: 30 | Fill #3

## 2016-09-24 DIAGNOSIS — E782 Mixed hyperlipidemia: Secondary | ICD-10-CM | POA: Diagnosis not present

## 2016-09-24 DIAGNOSIS — E1165 Type 2 diabetes mellitus with hyperglycemia: Secondary | ICD-10-CM | POA: Diagnosis not present

## 2016-09-28 DIAGNOSIS — I10 Essential (primary) hypertension: Secondary | ICD-10-CM | POA: Diagnosis not present

## 2016-09-28 DIAGNOSIS — E119 Type 2 diabetes mellitus without complications: Secondary | ICD-10-CM | POA: Diagnosis not present

## 2016-09-28 DIAGNOSIS — I2510 Atherosclerotic heart disease of native coronary artery without angina pectoris: Secondary | ICD-10-CM | POA: Diagnosis not present

## 2016-09-28 DIAGNOSIS — E785 Hyperlipidemia, unspecified: Secondary | ICD-10-CM | POA: Diagnosis not present

## 2016-09-28 DIAGNOSIS — E1169 Type 2 diabetes mellitus with other specified complication: Secondary | ICD-10-CM | POA: Diagnosis not present

## 2016-09-28 DIAGNOSIS — M1612 Unilateral primary osteoarthritis, left hip: Secondary | ICD-10-CM | POA: Diagnosis not present

## 2016-09-28 MED FILL — TRULICITY 0.75 MG/0.5 ML PE: 0.75 | 84 days supply | Qty: 6 | Fill #0

## 2016-10-01 MED FILL — LOSARTAN POTASSIUM 50 MG TA: 50 | 90 days supply | Qty: 90 | Fill #1

## 2016-10-01 MED FILL — PRASUGREL 10 MG TABLET: 10 | 30 days supply | Qty: 30 | Fill #2

## 2016-10-05 ENCOUNTER — Other Ambulatory Visit (INDEPENDENT_AMBULATORY_CARE_PROVIDER_SITE_OTHER): Payer: Self-pay

## 2016-10-05 ENCOUNTER — Ambulatory Visit (INDEPENDENT_AMBULATORY_CARE_PROVIDER_SITE_OTHER): Payer: 59 | Admitting: Orthopaedic Surgery

## 2016-10-05 DIAGNOSIS — M25552 Pain in left hip: Secondary | ICD-10-CM

## 2016-10-06 ENCOUNTER — Encounter (INDEPENDENT_AMBULATORY_CARE_PROVIDER_SITE_OTHER): Payer: Self-pay | Admitting: Physical Medicine and Rehabilitation

## 2016-10-06 ENCOUNTER — Ambulatory Visit (INDEPENDENT_AMBULATORY_CARE_PROVIDER_SITE_OTHER): Payer: 59 | Admitting: Physical Medicine and Rehabilitation

## 2016-10-06 ENCOUNTER — Ambulatory Visit (INDEPENDENT_AMBULATORY_CARE_PROVIDER_SITE_OTHER): Payer: Self-pay

## 2016-10-06 VITALS — BP 145/91 | HR 75

## 2016-10-06 DIAGNOSIS — M25552 Pain in left hip: Secondary | ICD-10-CM | POA: Diagnosis not present

## 2016-10-06 NOTE — Patient Instructions (Signed)

## 2016-10-06 NOTE — Progress Notes (Signed)
Sarah Stevenson - 61 y.o. female MRN [id number]  Date of birth: 1956-06-26  Office Visit Note: Visit Date: 10/06/2016 PCP: Woody Seller, MD Referred by: Christain Sacramento, MD  Subjective: Chief Complaint  Patient presents with  . 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 She reports it has become worse over time. Radiating into thigh and down to knee. Spasms in thigh. She is a diabetic. In discussion today about steroid medications and diabetes. She is here for diagnostic and therapeutic anesthetic hip arthrogram.    ROS Otherwise per HPI.  Assessment & Plan: Visit Diagnoses:  1. Pain in left hip     Plan: Findings:  Left diagnostic and therapeutic anesthetic hip arthrogram.    Meds & Orders: No orders of the defined types were placed in this encounter.   Orders Placed This Encounter  Procedures  . Large Joint Injection/Arthrocentesis  . XR C-ARM NO REPORT    Follow-up: Return if symptoms worsen or fail to improve, for Dr. [name].   Procedures: Large Joint Inj Date/Time: 10/06/2016 1:35 PM Performed by: Magnus Sinning Authorized by: Magnus Sinning   Consent Given by:  Patient Site marked: the procedure site was marked   Timeout: prior to procedure the correct patient, procedure, and site was verified   Indications:  Pain and diagnostic evaluation Location:  Hip Site:  L hip joint Prep: patient was prepped and draped in usual sterile fashion   Needle Size:  22 G Approach:  Anterior Ultrasound Guidance: No   Fluoroscopic Guidance: No   Arthrogram: Yes   Medications:  3 mL bupivacaine 0.5 %; 80 mg triamcinolone acetonide 40 MG/ML Aspiration Attempted: Yes   Patient tolerance:  Patient tolerated the procedure well with no immediate complications  Arthrogram demonstrated excellent flow of contrast throughout the joint surface without extravasation or obvious defect.  The  patient had relief of symptoms during the anesthetic phase of the injection. Even with anesthetic relief she still had antalgic gait to the left somewhat of a Trendelenburg sign.     No notes on file   Clinical History: No specialty comments available.  She reports that she quit smoking about 36 years ago. Her smoking use included Cigarettes. She has a 10.00 pack-year smoking history. She has never used smokeless tobacco. No results for input(s): HGBA1C, LABURIC in the last 8760 hours.  Objective:  VS:  HT:    WT:   BMI:     BP:(!) 145/91  HR:75bpm  TEMP: ( )  RESP:  Physical Exam  Musculoskeletal:  Painful left hip with internal rotation and external rotation. Decreased ability to forward flex. No pain over the greater trochanter.    Ortho Exam Imaging: Xr C-arm No Report  Result Date: 10/06/2016 Please see Notes or Procedures tab for imaging impression.   Past Medical/Family/Surgical/Social History: Medications & Allergies reviewed per EMR Patient Active Problem List   Diagnosis Date Noted  . Aortic stenosis, moderate   . Positive cardiac stress test   . Chest pain 12/13/2015  . CAD S/P percutaneous coronary angioplasty   . Trochanteric bursitis of left hip 08/14/2015  . Arthritis of left hip 08/14/2015  . Excessive daytime sleepiness 03/03/2015  . Snoring 12/20/2014  . CAD (coronary artery disease), native coronary artery 12/19/2014  . Diabetes mellitus type 2, noninsulin dependent ([place])   . Hyperlipidemia   . Murmur, cardiac   .  NSTEMI (non-ST elevated myocardial infarction) ([place]) 11/30/2014  . Unstable angina ([place]) 11/30/2014   Past Medical History:  Diagnosis Date  . Anginal pain ([place])   . Coronary artery disease   . Depression    "mild" (08/06/2016)  . Hyperlipidemia   . Murmur, cardiac   . Myocardial infarction 2016  . Osteoarthritis    "left hip and knee" (08/06/2016)  . Type II diabetes mellitus ([facility])    Family History  Problem Relation Age of  Onset  . Heart attack Mother   . Heart attack Father   . Sudden death Father   . Hypertension Neg Hx   . Hyperlipidemia Neg Hx   . Diabetes Neg Hx    Past Surgical History:  Procedure Laterality Date  . CARDIAC CATHETERIZATION Right 11/30/2014   Procedure: RIGHT/LEFT HEART CATH AND CORONARY ANGIOGRAPHY;  Surgeon: Belva Crome, MD;  Location: [facility] CATH LAB;  Service: Cardiovascular;  Laterality: Right;  . CARDIAC CATHETERIZATION N/A 12/13/2015   Procedure: Left Heart Cath and Coronary Angiography;  Surgeon: Leonie Man, MD;  Location: [place] CV LAB;  Service: Cardiovascular;  Laterality: N/A;  . CARDIAC CATHETERIZATION N/A 12/13/2015   Procedure: Intravascular Pressure Wire/FFR Study;  Surgeon: Leonie Man, MD;  Location: [place] CV LAB;  Service: Cardiovascular;  Laterality: N/A;  RCA  . CARDIAC CATHETERIZATION N/A 08/06/2016   Procedure: Right/Left Heart Cath and Coronary Angiography;  Surgeon: Lorretta Harp, MD;  Location: [place] CV LAB;  Service: Cardiovascular;  Laterality: N/A;  . CARDIAC CATHETERIZATION N/A 08/06/2016   Procedure: Coronary Stent Intervention;  Surgeon: Lorretta Harp, MD;  Location: [place] CV LAB;  Service: Cardiovascular;  Laterality: N/A;   distal rca 3.0x16 and 3.0x8 synergy  . CESAREAN SECTION  1989  . CORONARY ANGIOPLASTY WITH STENT PLACEMENT  08/06/2016   "2 stents"  . HYSTEROSCOPY DIAGNOSTIC  1990s  . KNEE ARTHROSCOPY Left 1996  . NASAL SINUS SURGERY  1977   Social History   Occupational History  . Not on file.   Social History Main Topics  . Smoking status: Former Smoker    Packs/day: 1.00    Years: 10.00    Types: Cigarettes    Quit date: 15  . Smokeless tobacco: Never Used  . Alcohol use 4.2 oz/week    7 Shots of liquor per week  . Drug use: No  . Sexual activity: No

## 2016-10-07 MED ORDER — TRIAMCINOLONE ACETONIDE 40 MG/ML IJ SUSP
80.0000 mg | INTRAMUSCULAR | Status: AC | PRN
  Administered 2016-10-06: 80 mg via INTRA_ARTICULAR

## 2016-10-07 MED ORDER — BUPIVACAINE HCL 0.5 % IJ SOLN
3.0000 mL | INTRAMUSCULAR | Status: AC | PRN
  Administered 2016-10-06: 3 mL via INTRA_ARTICULAR

## 2016-10-14 ENCOUNTER — Telehealth (HOSPITAL_COMMUNITY): Payer: Self-pay | Admitting: Family Medicine

## 2016-10-14 DIAGNOSIS — Z124 Encounter for screening for malignant neoplasm of cervix: Secondary | ICD-10-CM | POA: Diagnosis not present

## 2016-10-14 DIAGNOSIS — Z0000 Encounter for general adult medical examination without abnormal findings: Secondary | ICD-10-CM | POA: Diagnosis not present

## 2016-10-14 NOTE — Telephone Encounter (Signed)
S/w Almyra Free with [employer] verifying coverage for Cardiac and Pulm Rehab Phase II. Deductible $1350.00 with $949.88 already met, Out of Pocket $3500.00 with [phone] already met. 100% covered after Deductible met. 71 Sessions covered Reference # [id number] ... KJ

## 2016-10-16 MED FILL — glipiZIDE XL 10 MG TB24: 10 | 90 days supply | Qty: 90 | Fill #0

## 2016-11-02 ENCOUNTER — Encounter (HOSPITAL_COMMUNITY): Payer: Self-pay | Admitting: Family Medicine

## 2016-11-02 NOTE — Progress Notes (Signed)
Mailed letter with Cardiac Rehab Program to pt ... KJ  °

## 2016-11-05 ENCOUNTER — Other Ambulatory Visit (HOSPITAL_BASED_OUTPATIENT_CLINIC_OR_DEPARTMENT_OTHER): Payer: Self-pay | Admitting: Family Medicine

## 2016-11-05 DIAGNOSIS — Z1231 Encounter for screening mammogram for malignant neoplasm of breast: Secondary | ICD-10-CM

## 2016-11-06 MED FILL — ISOSORBIDE MN ER 30 MG TAB: 30 | 90 days supply | Qty: 90 | Fill #1

## 2016-11-06 MED FILL — PRASUGREL 10 MG TABLET: 10 | 30 days supply | Qty: 30 | Fill #3

## 2016-11-09 ENCOUNTER — Other Ambulatory Visit (HOSPITAL_BASED_OUTPATIENT_CLINIC_OR_DEPARTMENT_OTHER): Payer: Self-pay | Admitting: Family Medicine

## 2016-11-09 ENCOUNTER — Ambulatory Visit (HOSPITAL_BASED_OUTPATIENT_CLINIC_OR_DEPARTMENT_OTHER)
Admission: RE | Admit: 2016-11-09 | Discharge: 2016-11-09 | Disposition: A | Discharge status: Home / Self Care | Payer: 59 | Source: Ambulatory Visit | Attending: Family Medicine | Admitting: Family Medicine

## 2016-11-09 ENCOUNTER — Encounter (HOSPITAL_BASED_OUTPATIENT_CLINIC_OR_DEPARTMENT_OTHER): Payer: Self-pay

## 2016-11-09 DIAGNOSIS — Z1231 Encounter for screening mammogram for malignant neoplasm of breast: Secondary | ICD-10-CM | POA: Diagnosis not present

## 2016-11-10 ENCOUNTER — Encounter: Payer: Self-pay | Admitting: Interventional Cardiology

## 2016-11-17 NOTE — Progress Notes (Signed)
Cardiology Office Note    Date:  11/18/2016   ID:  [name], DOB 1956-01-04, MRN [id number]  PCP:  Woody Seller, MD  Cardiologist: Sinclair Grooms, MD   Chief Complaint  Patient presents with  . Coronary Artery Disease    History of Present Illness:  Sarah Stevenson is a 61 y.o. female  follow-up CAD with prior RCA DES March 2016 and November 2017 in the setting of unstable angina, moderately severe aortic stenosis, diabetes mellitus, hyperlipidemia, and osteoarthritis of her left hip.  Sarah Stevenson is doing well. She is back at work. She is limited in her physical abilities because of severe left hip discomfort. She has not able to exercise. She denies orthopnea, and PND. No angina has occurred. No peripheral edema. No medication side effects are noted.   Past Medical History:  Diagnosis Date  . Anginal pain ([place])   . Coronary artery disease   . Depression    "mild" (08/06/2016)  . Hyperlipidemia   . Murmur, cardiac   . Myocardial infarction 2016  . Osteoarthritis    "left hip and knee" (08/06/2016)  . Type II diabetes mellitus ([place])     Past Surgical History:  Procedure Laterality Date  . CARDIAC CATHETERIZATION Right 11/30/2014   Procedure: RIGHT/LEFT HEART CATH AND CORONARY ANGIOGRAPHY;  Surgeon: Belva Crome, MD;  Location: [facility] CATH LAB;  Service: Cardiovascular;  Laterality: Right;  . CARDIAC CATHETERIZATION N/A 12/13/2015   Procedure: Left Heart Cath and Coronary Angiography;  Surgeon: Leonie Man, MD;  Location: [place] CV LAB;  Service: Cardiovascular;  Laterality: N/A;  . CARDIAC CATHETERIZATION N/A 12/13/2015   Procedure: Intravascular Pressure Wire/FFR Study;  Surgeon: Leonie Man, MD;  Location: [place] CV LAB;  Service: Cardiovascular;  Laterality: N/A;  RCA  . CARDIAC CATHETERIZATION N/A 08/06/2016   Procedure: Right/Left Heart Cath and Coronary Angiography;  Surgeon: Lorretta Harp, MD;  Location: [place] CV LAB;  Service:  Cardiovascular;  Laterality: N/A;  . CARDIAC CATHETERIZATION N/A 08/06/2016   Procedure: Coronary Stent Intervention;  Surgeon: Lorretta Harp, MD;  Location: [place] CV LAB;  Service: Cardiovascular;  Laterality: N/A;   distal rca 3.0x16 and 3.0x8 synergy  . CESAREAN SECTION  1989  . CORONARY ANGIOPLASTY WITH STENT PLACEMENT  08/06/2016   "2 stents"  . HYSTEROSCOPY DIAGNOSTIC  1990s  . KNEE ARTHROSCOPY Left 1996  . NASAL SINUS SURGERY  1977    Current Medications: Outpatient Medications Prior to Visit  Medication Sig Dispense Refill  . Acetaminophen (TYLENOL ARTHRITIS PAIN PO) Take 650 mg by mouth daily as needed (for pain). For pain    . ALPRAZolam (XANAX) 0.5 MG tablet Take 0.5 mg by mouth at bedtime as needed for anxiety.    Marland Kitchen atorvastatin (LIPITOR) 40 MG tablet Take 1 tablet (40 mg total) by mouth daily. 30 tablet 12  . Black Pepper-Turmeric (TURMERIC COMPLEX/BLACK PEPPER) 3-500 MG CAPS Take 2 capsules by mouth daily.    . cetirizine (ZYRTEC) 10 MG tablet Take 10 mg by mouth daily.    . Cyanocobalamin (VITAMIN B-12) 500 MCG SUBL Place 1 each under the tongue daily.    . Dulaglutide (TRULICITY) 2.20 UR/4.2HC SOPN Inject 0.75 mg into the skin every Wednesday.     . fluticasone (FLONASE) 50 MCG/ACT nasal spray Place 1 spray into both nostrils daily as needed for allergies. For allergies    . glipiZIDE (GLUCOTROL XL) 10 MG 24 hr tablet Take 10 mg by  mouth daily.    Marland Kitchen losartan (COZAAR) 50 MG tablet Take 50 mg by mouth daily.     . Menthol-Methyl Salicylate (MUSCLE RUB EX) Apply 1 application topically as needed (for pain).    . metFORMIN (GLUCOPHAGE) 500 MG tablet Take 1,500 mg by mouth at bedtime.     . methocarbamol (ROBAXIN) 500 MG tablet Take 1 tablet (500 mg total) by mouth 3 (three) times daily as needed for muscle spasms. (Patient taking differently: Take 250-500 mg by mouth 3 (three) times daily as needed for muscle spasms. ) 90 tablet 3  . Multiple Vitamin (MULTIVITAMIN)  capsule Take 1 capsule by mouth daily.    Marland Kitchen oxyCODONE-acetaminophen (ROXICET) 5-325 MG tablet Take 0.5 tablets by mouth every 8 (eight) hours as needed for severe pain. 15 tablet 0  . ranitidine (ZANTAC) 150 MG tablet Take 150 mg by mouth daily as needed (heartburn).     . sertraline (ZOLOFT) 25 MG tablet Take 25 mg by mouth daily.   3  . carvedilol (COREG) 3.125 MG tablet TAKE 1 TABLET BY MOUTH TWICE DAILY WITH A MEAL (Patient taking differently: Take 3.125 mg by mouth 2 (two) times daily with a meal. ) 180 tablet 3  . nitroGLYCERIN (NITROSTAT) 0.4 MG SL tablet Place 1 tablet (0.4 mg total) under the tongue every 5 (five) minutes as needed for chest pain. 25 tablet 3  . prasugrel (EFFIENT) 10 MG TABS tablet Take 1 tablet (10 mg total) by mouth daily. 30 tablet 12  . isosorbide mononitrate (IMDUR) 30 MG 24 hr tablet Take 1 tablet (30 mg total) by mouth daily. 90 tablet 3   No facility-administered medications prior to visit.      Allergies:   Asa [aspirin]; Blue dyes (parenteral); Corn-containing products; Hetastarch; Isosulfan blue; Nsaids; Sugar-protein-starch; and Tea   Social History   Social History  . Marital status: Divorced    Spouse name: N/A  . Number of children: N/A  . Years of education: N/A   Social History Main Topics  . Smoking status: Former Smoker    Packs/day: 1.00    Years: 10.00    Types: Cigarettes    Quit date: 63  . Smokeless tobacco: Never Used  . Alcohol use 4.2 oz/week    7 Shots of liquor per week  . Drug use: No  . Sexual activity: No   Other Topics Concern  . None   Social History Narrative  . None     Family History:  The patient's family history includes Heart attack in her father and mother; Sudden death in her father.   ROS:   Please see the history of present illness.    Left hip discomfort. Some dyspnea on exertion. Physical deconditioning.  All other systems reviewed and are negative.   PHYSICAL EXAM:   VS:  BP 136/80 (BP  Location: Right Arm)   Pulse 81   Ht '5\' 3"'$  (1.6 m)   Wt 211 lb (95.7 kg)   BMI 37.38 kg/m    GEN: Well nourished, well developed, in no acute distress  HEENT: normal  Neck: no JVD, carotid bruits, or masses Cardiac: RRR. There is a 3 to 4/6 crescendo decrescendo systolic murmur of aortic stenosis. No diastolic murmurs heard. An S4 gallop is audible. No rub or peripheral edema is present. Respiratory:  clear to auscultation bilaterally, normal work of breathing GI: soft, nontender, nondistended, + BS MS: no deformity or atrophy  Skin: warm and dry, no rash Neuro:  Alert  and Oriented x 3, Strength and sensation are intact Psych: euthymic mood, full affect  Wt Readings from Last 3 Encounters:  11/18/16 211 lb (95.7 kg)  08/21/16 209 lb 8 oz (95 kg)  08/07/16 212 lb 11.9 oz (96.5 kg)      Studies/Labs Reviewed:   EKG:  EKG  Not repeated.  Recent Labs: 08/07/2016: BUN 9; Creatinine, Ser 0.92; Hemoglobin 11.0; Platelets 207; Potassium 4.2; Sodium 140   Lipid Panel    Component Value Date/Time   CHOL 121 12/01/2014 0425   TRIG 121 12/01/2014 0425   HDL 25 (L) 12/01/2014 0425   CHOLHDL 4.8 12/01/2014 0425   VLDL 24 12/01/2014 0425   LDLCALC 72 12/01/2014 0425    Additional studies/ records that were reviewed today include:  CARDIAC CATHETERIZATION 2017: Coronary Diagrams  Diagnostic Diagram    Post-Intervention Diagram        ASSESSMENT:    1. Coronary artery disease involving native coronary artery of native heart with unstable angina pectoris ([place])   2. Aortic stenosis, moderate   3. Preoperative cardiovascular examination   4. Other hyperlipidemia      PLAN:  In order of problems listed above:  1. She now has a near full metal jacket. She will be at increased risk for stent thrombosis with interruption of antiplatelet therapy over the next 6-12 months. She is not having angina. She has taken her medications daily as prescribed. No clinical complications.  Limited in her ability to participate in physical activity due to significant hip arthritis. Plan is to proceed with left hip replacement surgery after 6 months, and the longer we wait to approach 12 months would make it even safer. Clinical follow-up in 3-4 months. 2. This problem was moderate at last evaluation. No clinical symptoms at this time. Plan echocardiogram in July for yearly comparison with prior years data. I will see her shortly after the echocardiogram is performed at that time. Cautioned to call if angina, dyspnea, or syncope. 3. She is concerned about having left hip replacement surgery. The plan is an anterior approach. This am be done by Dr. Rush Farmer. I have recommended that she certainly not attempt hip replacement before June and I will prefer to be closer to 12 months before pausing antiplatelet therapy. Assuming no significant progression in aortic stenosis or recurrence of anginal symptoms, I will clear her to have hip surgery some time in the fall of 2018. 4. Being followed by primary care physician. Most recent laboratory data obtained within the last 2 months revealed an LDL cholesterol that was at or near 70.  Clinical follow-up in 4 months after 2-D Doppler echocardiogram is done to reassess aortic stenosis. At that time we will also reassess for the possibility of hip surgery sometime in the fall.  Medication Adjustments/Labs and Tests Ordered: Current medicines are reviewed at length with the patient today.  Concerns regarding medicines are outlined above.  Medication changes, Labs and Tests ordered today are listed in the Patient Instructions below. Patient Instructions  Medication Instructions:  None  Labwork: None  Testing/Procedures: None  Follow-Up: Your physician wants you to follow-up in:  Late July or Early August.  You will receive a reminder letter in the mail two months in advance. If you don't receive a letter, please call our office to schedule the  follow-up appointment.   Any Other Special Instructions Will Be Listed Below (If Applicable).     If you need a refill on your cardiac medications before your  next appointment, please call your pharmacy.      Signed, Sinclair Grooms, MD  11/18/2016 12:31 PM    [facility] [address], [place], [place]  [address] Phone: [phone]; Fax: [phone]

## 2016-11-18 ENCOUNTER — Encounter: Payer: Self-pay | Admitting: Interventional Cardiology

## 2016-11-18 ENCOUNTER — Ambulatory Visit (INDEPENDENT_AMBULATORY_CARE_PROVIDER_SITE_OTHER): Payer: 59 | Admitting: Interventional Cardiology

## 2016-11-18 ENCOUNTER — Encounter (INDEPENDENT_AMBULATORY_CARE_PROVIDER_SITE_OTHER): Payer: Self-pay

## 2016-11-18 VITALS — BP 136/80 | HR 81 | Ht 63.0 in | Wt 211.0 lb

## 2016-11-18 DIAGNOSIS — I25110 Atherosclerotic heart disease of native coronary artery with unstable angina pectoris: Secondary | ICD-10-CM | POA: Diagnosis not present

## 2016-11-18 DIAGNOSIS — I350 Nonrheumatic aortic (valve) stenosis: Secondary | ICD-10-CM

## 2016-11-18 DIAGNOSIS — E784 Other hyperlipidemia: Secondary | ICD-10-CM | POA: Diagnosis not present

## 2016-11-18 DIAGNOSIS — E7849 Other hyperlipidemia: Secondary | ICD-10-CM

## 2016-11-18 DIAGNOSIS — Z01810 Encounter for preprocedural cardiovascular examination: Secondary | ICD-10-CM | POA: Diagnosis not present

## 2016-11-18 MED ORDER — PRASUGREL HCL 10 MG PO TABS: 10.0000 mg | ORAL_TABLET | Freq: Every day | ORAL | 3 refills | Status: DC

## 2016-11-18 MED ORDER — CARVEDILOL 3.125 MG PO TABS: ORAL_TABLET | ORAL | 3 refills | Status: DC

## 2016-11-18 MED ORDER — NITROGLYCERIN 0.4 MG SL SUBL
0.4000 mg | SUBLINGUAL_TABLET | SUBLINGUAL | 3 refills | Status: DC | PRN
Start: 2016-11-18 — End: 2018-05-24

## 2016-11-18 MED FILL — CARVEDILOL 3.125 MG TABLET: 3.125 | 90 days supply | Qty: 180 | Fill #3

## 2016-11-18 MED FILL — METFORMIN HCL ER 750 MG TAB: 750 | 90 days supply | Qty: 180 | Fill #0

## 2016-11-18 MED FILL — NITROGLYCERIN 0.4 MG TAB SL: 0.4 | 30 days supply | Qty: 25 | Fill #0

## 2016-11-18 NOTE — Patient Instructions (Signed)
Medication Instructions:  None  Labwork: None  Testing/Procedures: None  Follow-Up: Your physician wants you to follow-up in:  Late July or Early August.  You will receive a reminder letter in the mail two months in advance. If you don't receive a letter, please call our office to schedule the follow-up appointment.   Any Other Special Instructions Will Be Listed Below (If Applicable).     If you need a refill on your cardiac medications before your next appointment, please call your pharmacy.

## 2016-11-19 ENCOUNTER — Telehealth (HOSPITAL_COMMUNITY): Payer: Self-pay | Admitting: Family Medicine

## 2016-11-26 ENCOUNTER — Encounter (HOSPITAL_COMMUNITY): Payer: Self-pay | Admitting: *Deleted

## 2016-12-03 MED FILL — CELECOXIB 200 MG CAPSULE: 200 | 90 days supply | Qty: 90 | Fill #0

## 2016-12-03 MED FILL — ATORVASTATIN 40 MG TABLET: 40 | 90 days supply | Qty: 90 | Fill #1

## 2016-12-03 MED FILL — PRASUGREL 10 MG TABLET: 10 | 30 days supply | Qty: 30 | Fill #4

## 2016-12-28 ENCOUNTER — Telehealth: Payer: Self-pay | Admitting: Interventional Cardiology

## 2016-12-28 NOTE — Telephone Encounter (Signed)
New Message     Pt is returning your call from Thursday

## 2016-12-28 NOTE — Telephone Encounter (Signed)
Spoke with pt and scheduled her to see Dr. Tamala Julian in July after her echo.  Pt appreciative for call.

## 2017-01-01 MED FILL — LOSARTAN POTASSIUM 50 MG TA: 50 | 90 days supply | Qty: 90 | Fill #2

## 2017-01-01 MED FILL — glipiZIDE XL 10 MG TB24: 10 | 90 days supply | Qty: 90 | Fill #1

## 2017-01-01 MED FILL — METHOCARBAMOL 500 MG TABLET: 500 | 30 days supply | Qty: 90 | Fill #1

## 2017-01-01 MED FILL — PRASUGREL 10 MG TABLET: 10 | 30 days supply | Qty: 30 | Fill #5

## 2017-02-05 MED FILL — CARVEDILOL 3.125 MG TABLET: 3.125 | 90 days supply | Qty: 180 | Fill #0

## 2017-02-05 MED FILL — TRULICITY 0.75 MG/0.5 ML PE: 0.75 | 84 days supply | Qty: 6 | Fill #1

## 2017-02-05 MED FILL — PRASUGREL 10 MG TABLET: 10 | 30 days supply | Qty: 30 | Fill #6

## 2017-02-05 MED FILL — ISOSORBIDE MN ER 30 MG TAB: 30 | 90 days supply | Qty: 90 | Fill #2

## 2017-02-08 DIAGNOSIS — M2550 Pain in unspecified joint: Secondary | ICD-10-CM | POA: Diagnosis not present

## 2017-02-08 DIAGNOSIS — W57XXXA Bitten or stung by nonvenomous insect and other nonvenomous arthropods, initial encounter: Secondary | ICD-10-CM | POA: Diagnosis not present

## 2017-02-08 DIAGNOSIS — R5383 Other fatigue: Secondary | ICD-10-CM | POA: Diagnosis not present

## 2017-02-08 MED FILL — DOXYCYCLINE HYCLATE 100 MG: 100 | 28 days supply | Qty: 28 | Fill #0

## 2017-02-19 MED FILL — METFORMIN HCL ER 750 MG TAB: 750 | 90 days supply | Qty: 180 | Fill #1

## 2017-03-03 MED FILL — SERTRALINE HCL 50 MG TABLET: 50 | 90 days supply | Qty: 90 | Fill #1

## 2017-03-03 MED FILL — CELECOXIB 200 MG CAPSULE: 200 | 90 days supply | Qty: 90 | Fill #1

## 2017-03-03 MED FILL — PRASUGREL 10 MG TABLET: 10 | 30 days supply | Qty: 30 | Fill #7

## 2017-03-15 ENCOUNTER — Ambulatory Visit (HOSPITAL_COMMUNITY): Payer: 59 | Attending: Cardiovascular Disease

## 2017-03-15 ENCOUNTER — Other Ambulatory Visit: Payer: Self-pay

## 2017-03-15 DIAGNOSIS — E119 Type 2 diabetes mellitus without complications: Secondary | ICD-10-CM | POA: Insufficient documentation

## 2017-03-15 DIAGNOSIS — Z87891 Personal history of nicotine dependence: Secondary | ICD-10-CM | POA: Diagnosis not present

## 2017-03-15 DIAGNOSIS — I2510 Atherosclerotic heart disease of native coronary artery without angina pectoris: Secondary | ICD-10-CM | POA: Insufficient documentation

## 2017-03-15 DIAGNOSIS — I350 Nonrheumatic aortic (valve) stenosis: Secondary | ICD-10-CM | POA: Diagnosis not present

## 2017-03-15 DIAGNOSIS — I252 Old myocardial infarction: Secondary | ICD-10-CM | POA: Diagnosis not present

## 2017-03-15 DIAGNOSIS — E785 Hyperlipidemia, unspecified: Secondary | ICD-10-CM | POA: Insufficient documentation

## 2017-03-22 DIAGNOSIS — E785 Hyperlipidemia, unspecified: Secondary | ICD-10-CM | POA: Diagnosis not present

## 2017-03-22 DIAGNOSIS — I10 Essential (primary) hypertension: Secondary | ICD-10-CM | POA: Diagnosis not present

## 2017-03-22 DIAGNOSIS — E119 Type 2 diabetes mellitus without complications: Secondary | ICD-10-CM | POA: Diagnosis not present

## 2017-03-24 DIAGNOSIS — Z794 Long term (current) use of insulin: Secondary | ICD-10-CM | POA: Diagnosis not present

## 2017-03-24 DIAGNOSIS — E119 Type 2 diabetes mellitus without complications: Secondary | ICD-10-CM | POA: Diagnosis not present

## 2017-03-24 DIAGNOSIS — E118 Type 2 diabetes mellitus with unspecified complications: Secondary | ICD-10-CM | POA: Diagnosis not present

## 2017-03-24 DIAGNOSIS — M1612 Unilateral primary osteoarthritis, left hip: Secondary | ICD-10-CM | POA: Diagnosis not present

## 2017-03-24 DIAGNOSIS — I10 Essential (primary) hypertension: Secondary | ICD-10-CM | POA: Diagnosis not present

## 2017-03-24 DIAGNOSIS — E782 Mixed hyperlipidemia: Secondary | ICD-10-CM | POA: Diagnosis not present

## 2017-03-24 MED FILL — ATORVASTATIN 40 MG TABLET: 40 | 90 days supply | Qty: 90 | Fill #0

## 2017-03-28 NOTE — Progress Notes (Signed)
Cardiology Office Note    Date:  03/29/2017   ID:  Sarah Stevenson, DOB August 14, 1956, MRN [id number]  PCP:  Christain Sacramento, MD  Cardiologist: Sinclair Grooms, MD   Chief Complaint  Patient presents with  . Cardiac Valve Problem    History of Present Illness:  Sarah Stevenson is a 61 y.o. female follow-up CAD with prior RCA DES March 2016 and November 2017 in the setting of unstable angina, moderately severe aortic stenosis, diabetes mellitus, hyperlipidemia, and osteoarthritis of her left hip.  She is having dyspnea on exertion but doesn't prevent activity or work. She is limited mostly by left hip pain related to arthritis. She denies angina. She is having no orthopnea, chest discomfort and has not had syncope. She denies lower extremity swelling. Recent echo demonstrates progression of stenosis of a congenitally bicuspid aortic valve.  Past Medical History:  Diagnosis Date  . Anginal pain ([place])   . Coronary artery disease   . Depression    "mild" (08/06/2016)  . Hyperlipidemia   . Murmur, cardiac   . Myocardial infarction ([place]) 2016  . Osteoarthritis    "left hip and knee" (08/06/2016)  . Type II diabetes mellitus ([place])     Past Surgical History:  Procedure Laterality Date  . CARDIAC CATHETERIZATION Right 11/30/2014   Procedure: RIGHT/LEFT HEART CATH AND CORONARY ANGIOGRAPHY;  Surgeon: Belva Crome, MD;  Location: [facility] CATH LAB;  Service: Cardiovascular;  Laterality: Right;  . CARDIAC CATHETERIZATION N/A 12/13/2015   Procedure: Left Heart Cath and Coronary Angiography;  Surgeon: Leonie Man, MD;  Location: [facility] CV LAB;  Service: Cardiovascular;  Laterality: N/A;  . CARDIAC CATHETERIZATION N/A 12/13/2015   Procedure: Intravascular Pressure Wire/FFR Study;  Surgeon: Leonie Man, MD;  Location: [facility] CV LAB;  Service: Cardiovascular;  Laterality: N/A;  RCA  . CARDIAC CATHETERIZATION N/A 08/06/2016   Procedure: Right/Left Heart Cath and Coronary  Angiography;  Surgeon: Lorretta Harp, MD;  Location: [place] [facility] CV LAB;  Service: Cardiovascular;  Laterality: N/A;  . CARDIAC CATHETERIZATION N/A 08/06/2016   Procedure: Coronary Stent Intervention;  Surgeon: Lorretta Harp, MD;  Location: [facility] CV LAB;  Service: Cardiovascular;  Laterality: N/A;   distal rca 3.0x16 and 3.0x8 synergy  . CESAREAN SECTION  1989  . CORONARY ANGIOPLASTY WITH STENT PLACEMENT  08/06/2016   "2 stents"  . HYSTEROSCOPY DIAGNOSTIC  1990s  . KNEE ARTHROSCOPY Left 1996  . NASAL SINUS SURGERY  1977    Current Medications: Outpatient Medications Prior to Visit  Medication Sig Dispense Refill  . Acetaminophen (TYLENOL ARTHRITIS PAIN PO) Take 650 mg by mouth daily as needed (for pain). For pain    . ALPRAZolam (XANAX) 0.5 MG tablet Take 0.5 mg by mouth at bedtime as needed for anxiety.    . Black Pepper-Turmeric (TURMERIC COMPLEX/BLACK PEPPER) 3-500 MG CAPS Take 2 capsules by mouth daily.    . carvedilol (COREG) 3.125 MG tablet TAKE 1 TABLET BY MOUTH TWICE DAILY WITH A MEAL 180 tablet 3  . cetirizine (ZYRTEC) 10 MG tablet Take 10 mg by mouth daily.    . Cyanocobalamin (VITAMIN B-12) 500 MCG SUBL Place 1 each under the tongue daily.    . Dulaglutide (TRULICITY) 5.68 LE/7.5TZ SOPN Inject 0.75 mg into the skin every Wednesday.     . fluticasone (FLONASE) 50 MCG/ACT nasal spray Place 1 spray into both nostrils daily as needed for allergies. For allergies    . glipiZIDE (GLUCOTROL  XL) 10 MG 24 hr tablet Take 10 mg by mouth daily.    Marland Kitchen losartan (COZAAR) 50 MG tablet Take 50 mg by mouth daily.     . Menthol-Methyl Salicylate (MUSCLE RUB EX) Apply 1 application topically as needed (for pain).    . metFORMIN (GLUCOPHAGE) 500 MG tablet Take 1,500 mg by mouth at bedtime.     . methocarbamol (ROBAXIN) 500 MG tablet Take 1 tablet (500 mg total) by mouth 3 (three) times daily as needed for muscle spasms. (Patient taking differently: Take 250-500 mg by mouth 3 (three) times  daily as needed for muscle spasms. ) 90 tablet 3  . Multiple Vitamin (MULTIVITAMIN) capsule Take 1 capsule by mouth daily.    . nitroGLYCERIN (NITROSTAT) 0.4 MG SL tablet Place 1 tablet (0.4 mg total) under the tongue every 5 (five) minutes as needed for chest pain (Call 911 at 3rd dose within 15 minutes.). 25 tablet 3  . oxyCODONE-acetaminophen (ROXICET) 5-325 MG tablet Take 0.5 tablets by mouth every 8 (eight) hours as needed for severe pain. 15 tablet 0  . prasugrel (EFFIENT) 10 MG TABS tablet Take 1 tablet (10 mg total) by mouth daily. 90 tablet 3  . ranitidine (ZANTAC) 150 MG tablet Take 150 mg by mouth daily as needed (heartburn).     . sertraline (ZOLOFT) 25 MG tablet Take 25 mg by mouth daily.   3  . atorvastatin (LIPITOR) 40 MG tablet Take 1 tablet (40 mg total) by mouth daily. 30 tablet 12  . isosorbide mononitrate (IMDUR) 30 MG 24 hr tablet Take 1 tablet (30 mg total) by mouth daily. 90 tablet 3   No facility-administered medications prior to visit.      Allergies:   Asa [aspirin]; Blue dyes (parenteral); Corn-containing products; Hetastarch; Isosulfan blue; Nsaids; Sugar-protein-starch; and Tea   Social History   Social History  . Marital status: Divorced    Spouse name: N/A  . Number of children: N/A  . Years of education: N/A   Social History Main Topics  . Smoking status: Former Smoker    Packs/day: 1.00    Years: 10.00    Types: Cigarettes    Quit date: 82  . Smokeless tobacco: Never Used  . Alcohol use 4.2 oz/week    7 Shots of liquor per week  . Drug use: No  . Sexual activity: No   Other Topics Concern  . None   Social History Narrative  . None     Family History:  The patient's family history includes Heart attack in her father and mother; Sudden death in her father.   ROS:   Please see the history of present illness.    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 She lost to be involved in a trial evaluating the benefits of chelation therapy. This is an NIH trial. All other systems reviewed and are negative.   PHYSICAL EXAM:   VS:  BP 124/80 (BP Location: Left Arm)   Pulse 60   Ht 5\' 3"  (1.6 m)   Wt 209 lb 12.8 oz (95.2 kg)   BMI 37.16 kg/m    GEN: Well nourished, well developed, in no acute distress  HEENT: normal  Neck: no JVD, carotid bruits, or masses Cardiac: RRR; There is a 3/6 crescendo decrescendo systolic murmur of aortic stenosis. No diastolic murmurs heard.  No, rub, gallop,or edema. Respiratory:  clear to auscultation bilaterally, normal work of breathing GI: soft, nontender, nondistended, + BS MS: no deformity or atrophy  Skin: warm and dry, no rash Neuro:  Alert and Oriented x 3, Strength and sensation are intact Psych: euthymic mood, full affect  Wt Readings from Last 3 Encounters:  03/29/17 209 lb 12.8 oz (95.2 kg)  11/18/16 211 lb (95.7 kg)  08/21/16 209 lb 8 oz (95 kg)      Studies/Labs Reviewed:   EKG:  EKG  Not repeated.  Recent Labs: 08/07/2016: BUN 9; Creatinine, Ser 0.92; Hemoglobin 11.0; Platelets 207; Potassium 4.2; Sodium 140   Lipid Panel    Component Value Date/Time   CHOL 121 12/01/2014 0425   TRIG 121 12/01/2014 0425   HDL 25 (L) 12/01/2014 0425   CHOLHDL 4.8 12/01/2014 0425   VLDL 24 12/01/2014 0425   LDLCALC 72 12/01/2014 0425    Additional studies/ records that were reviewed today include:  Echocardiogram 03/15/2017: Study Conclusions  - Left ventricle: The cavity size was normal. There was mild   concentric hypertrophy. Systolic function was normal. The   estimated ejection fraction was in the range of 55% to 60%. Wall   motion was normal; there were no regional wall motion   abnormalities. Doppler parameters are consistent with abnormal   left ventricular relaxation (grade 1 diastolic dysfunction). - Aortic valve: A  bicuspid morphology cannot be excluded;   moderately thickened, mildly calcified leaflets. There was   moderate stenosis. Mean gradient (S): 37 mm Hg. - Mitral valve: Mildly calcified annulus. - Left atrium: The atrium was moderately dilated.  Impressions:  - Aoric stenosis has progressed since 2017, but remains moderate.   The ejection jet remains early peaking.   ASSESSMENT:    1. Aortic stenosis, moderate   2. Coronary artery disease involving native coronary artery of native heart without angina pectoris   3. Other hyperlipidemia   4. Type 2 diabetes mellitus with complication, with long-term current use of insulin ([place])   5. Trochanteric bursitis of left hip   6. Snoring      PLAN:  In order of problems listed above:  1. The valve status is relatively stable compared to 9 months ago. Velocities 3.8 m/s. Perhaps the new onset of mild exertional dyspnea is related to the valve. She has not quite a critical #7 this point. 2. Stable without angina. She is status post PCI in the setting of ACS in November 2017. Also prior to that had PCI of a stable lesion. 3. LDL cholesterol needs to be less than 70. Her current direct LDL is 141. He is currently on atorvastatin 40 mg per day. We'll switch to rosuvastatin 40 mg per day and repeat lipid panel in 6-8 weeks. If LDL is still not at target, how referred to the lipid clinic to consider the addition of PCS K-9 therapy. 4. Diabetes is under reasonable control with LDL less than 7. 5. May be considering left hip replacement. Currently, she would tolerate surgery quite well and be at a slightly increased risk because of aortic stenosis. The risk is not prohibitive. She is now 6 months out from stent implantation and pausing dual antiplatelet therapy would be safe. 6. Needs to be evaluated with sleep study.  Clinical follow-up in one year. 2-D Doppler echocardiogram prior. Lipid panel in 6-8 weeks. Referred to lipid clinic if LDL not at  target on max dose rosuvastatin  Greater than 50% of the time  was spent in counseling concerning multiple issues including NIH trial, progression of aortic stenosis, Chelation therapy, and coronary artery disease management including LDL target.  Medication Adjustments/Labs and Tests Ordered: Current medicines are reviewed at length with the patient today.  Concerns regarding medicines are outlined above.  Medication changes, Labs and Tests ordered today are listed in the Patient Instructions below. Patient Instructions  Medication Instructions:  1) DISCONTINUE Atorvastatin 2) START Rosuvastatin 40mg  once daily  Labwork: Your physician recommends that you return for lab work in: 6 weeks (Lipid and liver)   Testing/Procedures: Your physician has requested that you have an echocardiogram next year about 2-4 weeks prior to July follow up. Echocardiography is a painless test that uses sound waves to create images of your heart. It provides your doctor with information about the size and shape of your heart and how well your heart's chambers and valves are working. This procedure takes approximately one hour. There are no restrictions for this procedure.    Follow-Up: Your physician wants you to follow-up in: 1 year with Dr. Tamala Julian.  You will receive a reminder letter in the mail two months in advance. If you don't receive a letter, please call our office to schedule the follow-up appointment.   Any Other Special Instructions Will Be Listed Below (If Applicable).  Please contact our office if you have increased shortness of breath.    If you need a refill on your cardiac medications before your next appointment, please call your pharmacy.      Signed, Sinclair Grooms, MD  03/29/2017 10:51 AM    [facility] [place], [place], [place]  [address] Phone: [phone]; Fax: [phone]

## 2017-03-29 ENCOUNTER — Ambulatory Visit (INDEPENDENT_AMBULATORY_CARE_PROVIDER_SITE_OTHER): Payer: 59 | Admitting: Interventional Cardiology

## 2017-03-29 ENCOUNTER — Encounter: Payer: Self-pay | Admitting: Interventional Cardiology

## 2017-03-29 VITALS — BP 124/80 | HR 60 | Ht 63.0 in | Wt 209.8 lb

## 2017-03-29 DIAGNOSIS — E784 Other hyperlipidemia: Secondary | ICD-10-CM

## 2017-03-29 DIAGNOSIS — E118 Type 2 diabetes mellitus with unspecified complications: Secondary | ICD-10-CM | POA: Diagnosis not present

## 2017-03-29 DIAGNOSIS — I2510 Atherosclerotic heart disease of native coronary artery without angina pectoris: Secondary | ICD-10-CM

## 2017-03-29 DIAGNOSIS — E7849 Other hyperlipidemia: Secondary | ICD-10-CM

## 2017-03-29 DIAGNOSIS — R0683 Snoring: Secondary | ICD-10-CM

## 2017-03-29 DIAGNOSIS — M7062 Trochanteric bursitis, left hip: Secondary | ICD-10-CM | POA: Diagnosis not present

## 2017-03-29 DIAGNOSIS — I350 Nonrheumatic aortic (valve) stenosis: Secondary | ICD-10-CM

## 2017-03-29 DIAGNOSIS — Z794 Long term (current) use of insulin: Secondary | ICD-10-CM

## 2017-03-29 MED ORDER — ROSUVASTATIN CALCIUM 40 MG PO TABS: 40.0000 mg | ORAL_TABLET | Freq: Every day | ORAL | 3 refills | Status: DC

## 2017-03-29 MED FILL — ROSUVASTATIN CALCIUM 40 MG: 40 | 90 days supply | Qty: 90 | Fill #0

## 2017-03-29 NOTE — Patient Instructions (Signed)
Medication Instructions:  1) DISCONTINUE Atorvastatin 2) START Rosuvastatin 40mg  once daily  Labwork: Your physician recommends that you return for lab work in: 6 weeks (Lipid and liver)   Testing/Procedures: Your physician has requested that you have an echocardiogram next year about 2-4 weeks prior to July follow up. Echocardiography is a painless test that uses sound waves to create images of your heart. It provides your doctor with information about the size and shape of your heart and how well your heart's chambers and valves are working. This procedure takes approximately one hour. There are no restrictions for this procedure.    Follow-Up: Your physician wants you to follow-up in: 1 year with Dr. Tamala Julian.  You will receive a reminder letter in the mail two months in advance. If you don't receive a letter, please call our office to schedule the follow-up appointment.   Any Other Special Instructions Will Be Listed Below (If Applicable).  Please contact our office if you have increased shortness of breath.    If you need a refill on your cardiac medications before your next appointment, please call your pharmacy.

## 2017-03-31 ENCOUNTER — Ambulatory Visit (INDEPENDENT_AMBULATORY_CARE_PROVIDER_SITE_OTHER): Payer: 59

## 2017-03-31 ENCOUNTER — Ambulatory Visit (INDEPENDENT_AMBULATORY_CARE_PROVIDER_SITE_OTHER): Payer: 59 | Admitting: Orthopaedic Surgery

## 2017-03-31 DIAGNOSIS — M1612 Unilateral primary osteoarthritis, left hip: Secondary | ICD-10-CM | POA: Insufficient documentation

## 2017-03-31 DIAGNOSIS — M25552 Pain in left hip: Secondary | ICD-10-CM | POA: Insufficient documentation

## 2017-03-31 DIAGNOSIS — M5432 Sciatica, left side: Secondary | ICD-10-CM | POA: Insufficient documentation

## 2017-03-31 NOTE — Progress Notes (Signed)
Office Visit Note   Patient: Sarah Stevenson           Date of Birth: 1955-09-14           MRN: [id number] Visit Date: 03/31/2017              Requested by: Christain Sacramento, MD [place], [place] [address] PCP: Christain Sacramento, MD   Assessment & Plan: Visit Diagnoses:  1. Sciatica, left side   2. Pain in left hip   3. Unilateral primary osteoarthritis, left hip     Plan: We had a long and thorough discussion about hip replacement surgery. I do feel that this will profoundly positively impact her quality of life. This will decrease her pain and improve our ability. She walks with significant lurch surrogate and this will help her back as well. We discussed in detail with the risk and benefits of the surgery are. We will have her stop her blood thinning medication for 3 days preoperative. All questions were encouraged and answered.  Follow-Up Instructions: Return for 2 weeks post-op.   Orders:  Orders Placed This Encounter  Procedures  . XR Lumbar Spine 2-3 Views  . XR HIP UNILAT W OR W/O PELVIS 1V LEFT   No orders of the defined types were placed in this encounter.     Procedures: No procedures performed   Clinical Data: No additional findings.   Subjective: No chief complaint on file. Patient is someone I've seen before with debilitating arthritis of her left hip. We had to hold off on hip replacement surgery due to the fact she is been on blood thinning medication and doing with cardiac stents. We also had to watch her from a diabetes standpoint her hemoglobin A1c was 10 and higher. She said that is now down to 7 on the last several visits. She is also been cleared by her cardiologist to have surgery at this point. She does have stenosis of the valves were hard. Her pain is daily and is severe. It is 10 out of 10. It is detrimentally affected her activities daily living, her quality of life, and her mobility. At this point she does wish proceed with total  hip arthroplasty. She is also been having low back pain that radiates down her left side. She is on occasional oxycodone and occasional Robaxin and this is helped some. She cannot take anti-inflammatories due to being on blood thinning medication.  HPI  Review of Systems She is alert and oriented 3 and in no acute distress  Objective: Vital Signs: There were no vitals taken for this visit.  Physical Exam Examination of her left hip show severe pain with internal/external rotation and significant limitations of external and internal rotation. She is otherwise alert and oriented 3. She has a positive straight leg raise to the left side but no severe weakness in the leg other than as a relates to the severe arthritis in her left hip. She has a significant Trendelenburg gait as well. Ortho Exam  Specialty Comments:  No specialty comments available.  Imaging: Xr Hip Unilat W Or W/o Pelvis 1v Left  Result Date: 03/31/2017 An AP pelvis and lateral of her left hip show severe end-stage arthritis of the left hip. There is complete loss of joint space. There is cystic changes in the femoral head and acetabulum. There sclerotic changes as well as per trigger osteophytes.  Xr Lumbar Spine 2-3 Views  Result Date: 03/31/2017 An AP and  lateral of the lumbar spine shows no acute findings. The alignment is well maintained however there is degenerative changes at several levels especially at L2-L3.    PMFS History: Patient Active Problem List   Diagnosis Date Noted  . Pain in left hip 03/31/2017  . Unilateral primary osteoarthritis, left hip 03/31/2017  . Sciatica, left side 03/31/2017  . Aortic stenosis, moderate   . Positive cardiac stress test   . Trochanteric bursitis of left hip 08/14/2015  . Arthritis of left hip 08/14/2015  . Excessive daytime sleepiness 03/03/2015  . Snoring 12/20/2014  . CAD (coronary artery disease), native coronary artery 12/19/2014  . Type 2 diabetes mellitus  with complication ([place])   . Hyperlipidemia   . Murmur, cardiac    Past Medical History:  Diagnosis Date  . Anginal pain ([place])   . Coronary artery disease   . Depression    "mild" (08/06/2016)  . Hyperlipidemia   . Murmur, cardiac   . Myocardial infarction ([place]) 2016  . Osteoarthritis    "left hip and knee" (08/06/2016)  . Type II diabetes mellitus ([facility])     Family History  Problem Relation Age of Onset  . Heart attack Mother   . Heart attack Father   . Sudden death Father   . Hypertension Neg Hx   . Hyperlipidemia Neg Hx   . Diabetes Neg Hx     Past Surgical History:  Procedure Laterality Date  . CARDIAC CATHETERIZATION Right 11/30/2014   Procedure: RIGHT/LEFT HEART CATH AND CORONARY ANGIOGRAPHY;  Surgeon: Belva Crome, MD;  Location: [facility] CATH LAB;  Service: Cardiovascular;  Laterality: Right;  . CARDIAC CATHETERIZATION N/A 12/13/2015   Procedure: Left Heart Cath and Coronary Angiography;  Surgeon: Leonie Man, MD;  Location: [facility] CV LAB;  Service: Cardiovascular;  Laterality: N/A;  . CARDIAC CATHETERIZATION N/A 12/13/2015   Procedure: Intravascular Pressure Wire/FFR Study;  Surgeon: Leonie Man, MD;  Location: [facility] CV LAB;  Service: Cardiovascular;  Laterality: N/A;  RCA  . CARDIAC CATHETERIZATION N/A 08/06/2016   Procedure: Right/Left Heart Cath and Coronary Angiography;  Surgeon: Lorretta Harp, MD;  Location: [facility] CV LAB;  Service: Cardiovascular;  Laterality: N/A;  . CARDIAC CATHETERIZATION N/A 08/06/2016   Procedure: Coronary Stent Intervention;  Surgeon: Lorretta Harp, MD;  Location: [facility] CV LAB;  Service: Cardiovascular;  Laterality: N/A;   distal rca 3.0x16 and 3.0x8 synergy  . CESAREAN SECTION  1989  . CORONARY ANGIOPLASTY WITH STENT PLACEMENT  08/06/2016   "2 stents"  . HYSTEROSCOPY DIAGNOSTIC  1990s  . KNEE ARTHROSCOPY Left 1996  . NASAL SINUS SURGERY  1977   Social History   Occupational History  . Not on file.    Social History Main Topics  . Smoking status: Former Smoker    Packs/day: 1.00    Years: 10.00    Types: Cigarettes    Quit date: 39  . Smokeless tobacco: Never Used  . Alcohol use 4.2 oz/week    7 Shots of liquor per week  . Drug use: No  . Sexual activity: No

## 2017-04-08 MED FILL — PRASUGREL 10 MG TABLET: 10 | 30 days supply | Qty: 30 | Fill #8

## 2017-04-08 MED FILL — glipiZIDE XL 10 MG TB24: 10 | 90 days supply | Qty: 90 | Fill #2

## 2017-04-08 MED FILL — LOSARTAN POTASSIUM 50 MG TA: 50 | 90 days supply | Qty: 90 | Fill #3

## 2017-04-09 ENCOUNTER — Other Ambulatory Visit (INDEPENDENT_AMBULATORY_CARE_PROVIDER_SITE_OTHER): Payer: Self-pay | Admitting: Orthopaedic Surgery

## 2017-04-09 DIAGNOSIS — M1612 Unilateral primary osteoarthritis, left hip: Secondary | ICD-10-CM

## 2017-04-13 NOTE — Patient Instructions (Addendum)
Sarah Stevenson  04/13/2017   Your procedure is scheduled on: 04-23-17   Report to [facility] Main Entrance Report to Admitting at 11:45 AM   Call this number if you have problems the morning of surgery  [phone]   Remember: ONLY 1 PERSON MAY GO WITH YOU TO SHORT STAY TO GET  READY MORNING OF YOUR SURGERY.  Do not eat food or drink liquids :After Midnight. You may have a Clear Liquid Diet  From Midnight until 8:15 AM. After 8:15 AM,nothing by mouth     CLEAR LIQUID DIET   Foods Allowed                                                                     Foods Excluded  Coffee and tea, regular and decaf                             liquids that you cannot  Plain Jell-O in any flavor                                             see through such as: Fruit ices (not with fruit pulp)                                     milk, soups, orange juice  Iced Popsicles                                    All solid food Carbonated beverages, regular and diet                                    Cranberry, grape and apple juices Sports drinks like Gatorade Lightly seasoned clear broth or consume(fat free) Sugar, honey syrup  Sample Menu Breakfast                                Lunch                                     Supper Cranberry juice                    Beef broth                            Chicken broth Jell-O                                     Grape juice  Apple juice Coffee or tea                        Jell-O                                      Popsicle                                                Coffee or tea                        Coffee or tea  _____________________________________________________________________     Take these medicines the morning of surgery with A SIP OF WATER: Zoloft, Zyrtec, and Carvedilol DO NOT TAKE ANY DIABETIC MEDICATIONS DAY OF YOUR SURGERY                               You may not have any metal on  your body including hair pins and              piercings  Do not wear jewelry, make-up, lotions, powders or perfumes, deodorant             Do not wear nail polish.  Do not shave  48 hours prior to surgery.              Men may shave face and neck.   Do not bring valuables to the hospital. [place].  Contacts, dentures or bridgework may not be worn into surgery.  Leave suitcase in the car. After surgery it may be brought to your room.              Please read over the following fact sheets you were given: _____________________________________________________________________  How to Manage Your Diabetes Before and After Surgery  Why is it important to control my blood sugar before and after surgery? . Improving blood sugar levels before and after surgery helps healing and can limit problems. . A way of improving blood sugar control is eating a healthy diet by: o  Eating less sugar and carbohydrates o  Increasing activity/exercise o  Talking with your doctor about reaching your blood sugar goals . High blood sugars (greater than 180 mg/dL) can raise your risk of infections and slow your recovery, so you will need to focus on controlling your diabetes during the weeks before surgery. . Make sure that the doctor who takes care of your diabetes knows about your planned surgery including the date and location.  How do I manage my blood sugar before surgery? . Check your blood sugar at least 4 times a day, starting 2 days before surgery, to make sure that the level is not too high or low. o Check your blood sugar the morning of your surgery when you wake up and every 2 hours until you get to the Short Stay unit. . If your blood sugar is less than 70 mg/dL, you will need to treat for low blood sugar: o Do not take insulin. o Treat a low blood sugar (less than 70 mg/dL) with  cup of clear juice (cranberry or apple), 4 glucose tablets, OR  glucose gel. o Recheck blood sugar in 15 minutes after treatment (to make sure it is greater than 70 mg/dL). If your blood sugar is not greater than 70 mg/dL on recheck, call [phone] for further instructions. . Report your blood sugar to the short stay nurse when you get to Short Stay.  . If you are admitted to the hospital after surgery: o Your blood sugar will be checked by the staff and you will probably be given insulin after surgery (instead of oral diabetes medicines) to make sure you have good blood sugar levels. o The goal for blood sugar control after surgery is 80-180 mg/dL.   WHAT DO I DO ABOUT MY DIABETES MEDICATION?  Marland Kitchen Do not take oral diabetes medicines (pills) the morning of surgery.  . THE DAY BEFORE SURGERY, take your usual dose of Metformin. Take only your morning and/or lunch dose of Glipizide         Patient Signature:  Date:   Nurse Signature:  Date:   Reviewed and Endorsed by [facility] Patient Education Committee, August 2015  [facility] - Preparing for Surgery Before surgery, you can play an important role.  Because skin is not sterile, your skin needs to be as free of germs as possible.  You can reduce the number of germs on your skin by washing with CHG (chlorahexidine gluconate) soap before surgery.  CHG is an antiseptic cleaner which kills germs and bonds with the skin to continue killing germs even after washing. Please DO NOT use if you have an allergy to CHG or antibacterial soaps.  If your skin becomes reddened/irritated stop using the CHG and inform your nurse when you arrive at Short Stay. Do not shave (including legs and underarms) for at least 48 hours prior to the first CHG shower.  You may shave your face/neck. Please follow these instructions carefully:  1.  Shower with CHG Soap the night before surgery and the  morning of Surgery.  2.  If you choose to wash your hair, wash your hair first as usual with your  normal  shampoo.  3.  After you  shampoo, rinse your hair and body thoroughly to remove the  shampoo.                           4.  Use CHG as you would any other liquid soap.  You can apply chg directly  to the skin and wash                       Gently with a scrungie or clean washcloth.  5.  Apply the CHG Soap to your body ONLY FROM THE NECK DOWN.   Do not use on face/ open                           Wound or open sores. Avoid contact with eyes, ears mouth and genitals (private parts).                       Wash face,  Genitals (private parts) with your normal soap.             6.  Wash thoroughly, paying special attention to the area where your surgery  will be performed.  7.  Thoroughly rinse your body with warm  water from the neck down.  8.  DO NOT shower/wash with your normal soap after using and rinsing off  the CHG Soap.                9.  Pat yourself dry with a clean towel.            10.  Wear clean pajamas.            11.  Place clean sheets on your bed the night of your first shower and do not  sleep with pets. Day of Surgery : Do not apply any lotions/deodorants the morning of surgery.  Please wear clean clothes to the hospital/surgery center.  FAILURE TO FOLLOW THESE INSTRUCTIONS MAY RESULT IN THE CANCELLATION OF YOUR SURGERY PATIENT SIGNATURE_________________________________  NURSE SIGNATURE__________________________________  ________________________________________________________________________   Adam Phenix  An incentive spirometer is a tool that can help keep your lungs clear and active. This tool measures how well you are filling your lungs with each breath. Taking long deep breaths may help reverse or decrease the chance of developing breathing (pulmonary) problems (especially infection) following:  A long period of time when you are unable to move or be active. BEFORE THE PROCEDURE   If the spirometer includes an indicator to show your best effort, your nurse or respiratory therapist  will set it to a desired goal.  If possible, sit up straight or lean slightly forward. Try not to slouch.  Hold the incentive spirometer in an upright position. INSTRUCTIONS FOR USE  1. Sit on the edge of your bed if possible, or sit up as far as you can in bed or on a chair. 2. Hold the incentive spirometer in an upright position. 3. Breathe out normally. 4. Place the mouthpiece in your mouth and seal your lips tightly around it. 5. Breathe in slowly and as deeply as possible, raising the piston or the ball toward the top of the column. 6. Hold your breath for 3-5 seconds or for as long as possible. Allow the piston or ball to fall to the bottom of the column. 7. Remove the mouthpiece from your mouth and breathe out normally. 8. Rest for a few seconds and repeat Steps 1 through 7 at least 10 times every 1-2 hours when you are awake. Take your time and take a few normal breaths between deep breaths. 9. The spirometer may include an indicator to show your best effort. Use the indicator as a goal to work toward during each repetition. 10. After each set of 10 deep breaths, practice coughing to be sure your lungs are clear. If you have an incision (the cut made at the time of surgery), support your incision when coughing by placing a pillow or rolled up towels firmly against it. Once you are able to get out of bed, walk around indoors and cough well. You may stop using the incentive spirometer when instructed by your caregiver.  RISKS AND COMPLICATIONS  Take your time so you do not get dizzy or light-headed.  If you are in pain, you may need to take or ask for pain medication before doing incentive spirometry. It is harder to take a deep breath if you are having pain. AFTER USE  Rest and breathe slowly and easily.  It can be helpful to keep track of a log of your progress. Your caregiver can provide you with a simple table to help with this. If you are using the spirometer at home, follow  these instructions:  SEEK MEDICAL CARE IF:   You are having difficultly using the spirometer.  You have trouble using the spirometer as often as instructed.  Your pain medication is not giving enough relief while using the spirometer.  You develop fever of 100.5 F (38.1 C) or higher. SEEK IMMEDIATE MEDICAL CARE IF:   You cough up bloody sputum that had not been present before.  You develop fever of 102 F (38.9 C) or greater.  You develop worsening pain at or near the incision site. MAKE SURE YOU:   Understand these instructions.  Will watch your condition.  Will get help right away if you are not doing well or get worse. Document Released: 01/04/2007 Document Revised: 11/16/2011 Document Reviewed: 03/07/2007 [facility] Patient Information 2014 [place], [place].   ________________________________________________________________________

## 2017-04-13 NOTE — Progress Notes (Addendum)
03-29-17 (EPIC) Surgical clearance from Dr. Tamala Julian 03-15-17 (EPIC) ECHO  'Aortic stenosis has progressed since 2017, but remains moderate'  03-22-17 HGA1C 7.1 on chart 08-21-16 (EPIC) EKG NSR 08-06-16 (EPIC) Stress Test-'Finding consistent w/ischemia'  Pt reported that she had an initial stent placed on 12-2013. Then had a Stress test on 08-06-16, which noted that it was consistent with ischemia'. At that time, she was taken back to the cath lab, to have two more stents placed, because there was a gap between the initial stent.  Pt also had a EKG on 08-21-16 that showed NSR. She recently had a 2D Echo done on 03-15-17 that shows 'moderate stenosis' with 'no significant regurgitation'.  EF 55-60%. Pt also has surgical clearance from Dr. Tamala Julian, cardiologist.  Dr. Tobias Alexander Anesthesiologist, made aware of the above. No new orders at this time.

## 2017-04-14 ENCOUNTER — Encounter (HOSPITAL_COMMUNITY)
Admission: RE | Admit: 2017-04-14 | Discharge: 2017-04-14 | Disposition: A | Discharge status: Home / Self Care | Payer: 59 | Source: Ambulatory Visit | Attending: Orthopaedic Surgery | Admitting: Orthopaedic Surgery

## 2017-04-14 ENCOUNTER — Encounter (HOSPITAL_COMMUNITY): Payer: Self-pay

## 2017-04-14 ENCOUNTER — Inpatient Hospital Stay (HOSPITAL_COMMUNITY): Admission: RE | Admit: 2017-04-14 | Payer: 59 | Source: Ambulatory Visit

## 2017-04-14 DIAGNOSIS — E119 Type 2 diabetes mellitus without complications: Secondary | ICD-10-CM | POA: Diagnosis not present

## 2017-04-14 DIAGNOSIS — M1612 Unilateral primary osteoarthritis, left hip: Secondary | ICD-10-CM | POA: Insufficient documentation

## 2017-04-14 DIAGNOSIS — Z01818 Encounter for other preprocedural examination: Secondary | ICD-10-CM | POA: Diagnosis not present

## 2017-04-14 LAB — GLUCOSE, CAPILLARY: Glucose-Capillary: 169 mg/dL — ABNORMAL HIGH (ref 65–99)

## 2017-04-14 LAB — BASIC METABOLIC PANEL
Anion gap: 12 (ref 5–15)
BUN: 16 mg/dL (ref 6–20)
CHLORIDE: 101 mmol/L (ref 101–111)
CO2: 23 mmol/L (ref 22–32)
CREATININE: 1.1 mg/dL — AB (ref 0.44–1.00)
Calcium: 8.8 mg/dL — ABNORMAL LOW (ref 8.9–10.3)
GFR, EST NON AFRICAN AMERICAN: 53 mL/min — AB (ref >60)
Glucose, Bld: 145 mg/dL — ABNORMAL HIGH (ref 65–99)
POTASSIUM: 4.3 mmol/L (ref 3.5–5.1)
SODIUM: 136 mmol/L (ref 135–145)

## 2017-04-14 LAB — CBC
HCT: 37.2 % (ref 36.0–46.0)
Hemoglobin: 12.4 g/dL (ref 12.0–15.0)
MCH: 31 pg (ref 26.0–34.0)
MCHC: 33.3 g/dL (ref 30.0–36.0)
MCV: 93 fL (ref 78.0–100.0)
PLATELETS: 204 10*3/uL (ref 150–400)
RBC: 4 MIL/uL (ref 3.87–5.11)
RDW: 12.5 % (ref 11.5–15.5)
WBC: 8.6 10*3/uL (ref 4.0–10.5)

## 2017-04-14 LAB — SURGICAL PCR SCREEN
MRSA, PCR: NEGATIVE
STAPHYLOCOCCUS AUREUS: NEGATIVE

## 2017-04-14 LAB — HEMOGLOBIN A1C
Hgb A1c MFr Bld: 7.6 % — ABNORMAL HIGH (ref 4.8–5.6)
MEAN PLASMA GLUCOSE: 171.42 mg/dL

## 2017-04-20 DIAGNOSIS — R768 Other specified abnormal immunological findings in serum: Secondary | ICD-10-CM | POA: Diagnosis not present

## 2017-04-20 DIAGNOSIS — Z6837 Body mass index (BMI) 37.0-37.9, adult: Secondary | ICD-10-CM | POA: Diagnosis not present

## 2017-04-20 DIAGNOSIS — Z8261 Family history of arthritis: Secondary | ICD-10-CM | POA: Diagnosis not present

## 2017-04-20 DIAGNOSIS — M2550 Pain in unspecified joint: Secondary | ICD-10-CM | POA: Diagnosis not present

## 2017-04-20 DIAGNOSIS — I2510 Atherosclerotic heart disease of native coronary artery without angina pectoris: Secondary | ICD-10-CM | POA: Diagnosis not present

## 2017-04-20 DIAGNOSIS — M7989 Other specified soft tissue disorders: Secondary | ICD-10-CM | POA: Diagnosis not present

## 2017-04-20 DIAGNOSIS — E669 Obesity, unspecified: Secondary | ICD-10-CM | POA: Diagnosis not present

## 2017-04-20 DIAGNOSIS — I350 Nonrheumatic aortic (valve) stenosis: Secondary | ICD-10-CM | POA: Diagnosis not present

## 2017-04-21 NOTE — Progress Notes (Signed)
Pt advised that surgery time had changed from 2:15 PM to 3:15 PM on 04-23-17. Pt also advised to arrive at 12:45 PM, and that she could continue her clear liquid diet until 9:15AM.

## 2017-04-23 ENCOUNTER — Inpatient Hospital Stay (HOSPITAL_COMMUNITY): Payer: 59 | Admitting: Certified Registered Nurse Anesthetist

## 2017-04-23 ENCOUNTER — Encounter (HOSPITAL_COMMUNITY): Payer: Self-pay | Admitting: Certified Registered Nurse Anesthetist

## 2017-04-23 ENCOUNTER — Inpatient Hospital Stay (HOSPITAL_COMMUNITY): Payer: 59

## 2017-04-23 ENCOUNTER — Inpatient Hospital Stay (HOSPITAL_COMMUNITY)
Admission: RE | Admit: 2017-04-23 | Discharge: 2017-04-25 | DRG: 470 | Disposition: A | Discharge status: Home Health | Payer: 59 | Source: Ambulatory Visit | Attending: Orthopaedic Surgery | Admitting: Orthopaedic Surgery

## 2017-04-23 ENCOUNTER — Encounter (HOSPITAL_COMMUNITY)
Admission: RE | Disposition: A | Discharge status: Home Health | Payer: Self-pay | Source: Ambulatory Visit | Attending: Orthopaedic Surgery

## 2017-04-23 DIAGNOSIS — Z7984 Long term (current) use of oral hypoglycemic drugs: Secondary | ICD-10-CM

## 2017-04-23 DIAGNOSIS — I10 Essential (primary) hypertension: Secondary | ICD-10-CM | POA: Diagnosis present

## 2017-04-23 DIAGNOSIS — E118 Type 2 diabetes mellitus with unspecified complications: Secondary | ICD-10-CM | POA: Diagnosis present

## 2017-04-23 DIAGNOSIS — Z91018 Allergy to other foods: Secondary | ICD-10-CM

## 2017-04-23 DIAGNOSIS — Z886 Allergy status to analgesic agent status: Secondary | ICD-10-CM

## 2017-04-23 DIAGNOSIS — M25552 Pain in left hip: Secondary | ICD-10-CM | POA: Diagnosis present

## 2017-04-23 DIAGNOSIS — Z6837 Body mass index (BMI) 37.0-37.9, adult: Secondary | ICD-10-CM | POA: Diagnosis not present

## 2017-04-23 DIAGNOSIS — I350 Nonrheumatic aortic (valve) stenosis: Secondary | ICD-10-CM | POA: Diagnosis present

## 2017-04-23 DIAGNOSIS — F329 Major depressive disorder, single episode, unspecified: Secondary | ICD-10-CM | POA: Diagnosis present

## 2017-04-23 DIAGNOSIS — Z471 Aftercare following joint replacement surgery: Secondary | ICD-10-CM | POA: Diagnosis not present

## 2017-04-23 DIAGNOSIS — E785 Hyperlipidemia, unspecified: Secondary | ICD-10-CM | POA: Diagnosis present

## 2017-04-23 DIAGNOSIS — Z96642 Presence of left artificial hip joint: Secondary | ICD-10-CM

## 2017-04-23 DIAGNOSIS — I2510 Atherosclerotic heart disease of native coronary artery without angina pectoris: Secondary | ICD-10-CM | POA: Diagnosis present

## 2017-04-23 DIAGNOSIS — R011 Cardiac murmur, unspecified: Secondary | ICD-10-CM | POA: Diagnosis present

## 2017-04-23 DIAGNOSIS — M1612 Unilateral primary osteoarthritis, left hip: Principal | ICD-10-CM | POA: Diagnosis present

## 2017-04-23 DIAGNOSIS — Z96641 Presence of right artificial hip joint: Secondary | ICD-10-CM | POA: Diagnosis not present

## 2017-04-23 DIAGNOSIS — I252 Old myocardial infarction: Secondary | ICD-10-CM

## 2017-04-23 DIAGNOSIS — Z87891 Personal history of nicotine dependence: Secondary | ICD-10-CM | POA: Diagnosis not present

## 2017-04-23 DIAGNOSIS — Z79899 Other long term (current) drug therapy: Secondary | ICD-10-CM

## 2017-04-23 DIAGNOSIS — I25119 Atherosclerotic heart disease of native coronary artery with unspecified angina pectoris: Secondary | ICD-10-CM | POA: Diagnosis present

## 2017-04-23 DIAGNOSIS — R269 Unspecified abnormalities of gait and mobility: Secondary | ICD-10-CM | POA: Diagnosis not present

## 2017-04-23 DIAGNOSIS — E669 Obesity, unspecified: Secondary | ICD-10-CM | POA: Diagnosis present

## 2017-04-23 DIAGNOSIS — Z419 Encounter for procedure for purposes other than remedying health state, unspecified: Secondary | ICD-10-CM

## 2017-04-23 DIAGNOSIS — E1169 Type 2 diabetes mellitus with other specified complication: Secondary | ICD-10-CM | POA: Diagnosis not present

## 2017-04-23 HISTORY — PX: TOTAL HIP ARTHROPLASTY: SHX124

## 2017-04-23 LAB — GLUCOSE, CAPILLARY
Glucose-Capillary: 143 mg/dL — ABNORMAL HIGH (ref 65–99)
Glucose-Capillary: 143 mg/dL — ABNORMAL HIGH (ref 65–99)
Glucose-Capillary: 181 mg/dL — ABNORMAL HIGH (ref 65–99)
Glucose-Capillary: 320 mg/dL — ABNORMAL HIGH (ref 65–99)

## 2017-04-23 SURGERY — ARTHROPLASTY, HIP, TOTAL, ANTERIOR APPROACH
Anesthesia: General | Site: Hip | Laterality: Left

## 2017-04-23 MED ORDER — LIDOCAINE 2% (20 MG/ML) 5 ML SYRINGE
INTRAMUSCULAR | Status: AC
Start: 2017-04-23 — End: ?
  Filled 2017-04-23: qty 5

## 2017-04-23 MED ORDER — DOCUSATE SODIUM 100 MG PO CAPS
100.0000 mg | ORAL_CAPSULE | Freq: Two times a day (BID) | ORAL | Status: DC
  Administered 2017-04-23 – 2017-04-25 (×4): 100 mg via ORAL
  Filled 2017-04-23 (×4): qty 1

## 2017-04-23 MED ORDER — SODIUM CHLORIDE 0.9 % IV SOLN
INTRAVENOUS | Status: DC | PRN
  Administered 2017-04-23: 50 ug/min via INTRAVENOUS

## 2017-04-23 MED ORDER — PROPOFOL 10 MG/ML IV BOLUS
INTRAVENOUS | Status: AC
  Filled 2017-04-23: qty 40

## 2017-04-23 MED ORDER — METFORMIN HCL ER 500 MG PO TB24
1500.0000 mg | ORAL_TABLET | Freq: Every day | ORAL | Status: DC
  Administered 2017-04-23 – 2017-04-24 (×2): 1500 mg via ORAL
  Filled 2017-04-23 (×2): qty 3

## 2017-04-23 MED ORDER — PHENYLEPHRINE 40 MCG/ML (10ML) SYRINGE FOR IV PUSH (FOR BLOOD PRESSURE SUPPORT)
PREFILLED_SYRINGE | INTRAVENOUS | Status: AC
  Filled 2017-04-23: qty 10

## 2017-04-23 MED ORDER — DEXAMETHASONE SODIUM PHOSPHATE 10 MG/ML IJ SOLN
INTRAMUSCULAR | Status: DC | PRN
  Administered 2017-04-23: 10 mg via INTRAVENOUS

## 2017-04-23 MED ORDER — DIPHENHYDRAMINE HCL 12.5 MG/5ML PO ELIX
12.5000 mg | ORAL_SOLUTION | ORAL | Status: DC | PRN
  Filled 2017-04-23: qty 10

## 2017-04-23 MED ORDER — PHENOL 1.4 % MT LIQD: 1.0000 | OROMUCOSAL | Status: DC | PRN

## 2017-04-23 MED ORDER — METOCLOPRAMIDE HCL 5 MG/ML IJ SOLN: 10.0000 mg | Freq: Once | INTRAMUSCULAR | Status: DC | PRN

## 2017-04-23 MED ORDER — SODIUM CHLORIDE 0.9 % IR SOLN
Status: DC | PRN
  Administered 2017-04-23: 1000 mL

## 2017-04-23 MED ORDER — ACETAMINOPHEN 325 MG PO TABS
650.0000 mg | ORAL_TABLET | Freq: Four times a day (QID) | ORAL | Status: DC | PRN
  Administered 2017-04-24 – 2017-04-25 (×4): 650 mg via ORAL
  Filled 2017-04-23 (×4): qty 2

## 2017-04-23 MED ORDER — CEFAZOLIN SODIUM-DEXTROSE 2-4 GM/100ML-% IV SOLN
2.0000 g | INTRAVENOUS | Status: AC
  Administered 2017-04-23: 2 g via INTRAVENOUS

## 2017-04-23 MED ORDER — ONDANSETRON HCL 4 MG/2ML IJ SOLN
INTRAMUSCULAR | Status: DC | PRN
  Administered 2017-04-23: 4 mg via INTRAVENOUS

## 2017-04-23 MED ORDER — ONDANSETRON HCL 4 MG/2ML IJ SOLN: 4.0000 mg | Freq: Four times a day (QID) | INTRAMUSCULAR | Status: DC | PRN

## 2017-04-23 MED ORDER — GLIPIZIDE ER 5 MG PO TB24
10.0000 mg | ORAL_TABLET | Freq: Every day | ORAL | Status: DC
  Administered 2017-04-24 – 2017-04-25 (×2): 10 mg via ORAL
  Filled 2017-04-23 (×2): qty 2

## 2017-04-23 MED ORDER — METHOCARBAMOL 1000 MG/10ML IJ SOLN
500.0000 mg | Freq: Four times a day (QID) | INTRAVENOUS | Status: DC | PRN
  Administered 2017-04-23: 500 mg via INTRAVENOUS
  Filled 2017-04-23: qty 550

## 2017-04-23 MED ORDER — ONDANSETRON HCL 4 MG PO TABS: 4.0000 mg | ORAL_TABLET | Freq: Four times a day (QID) | ORAL | Status: DC | PRN

## 2017-04-23 MED ORDER — CARVEDILOL 3.125 MG PO TABS
3.1250 mg | ORAL_TABLET | Freq: Two times a day (BID) | ORAL | Status: DC
  Administered 2017-04-23 – 2017-04-25 (×4): 3.125 mg via ORAL
  Filled 2017-04-23 (×4): qty 1

## 2017-04-23 MED ORDER — SUCCINYLCHOLINE CHLORIDE 200 MG/10ML IV SOSY
PREFILLED_SYRINGE | INTRAVENOUS | Status: DC | PRN
  Administered 2017-04-23: 100 mg via INTRAVENOUS

## 2017-04-23 MED ORDER — MEPERIDINE HCL 50 MG/ML IJ SOLN: 6.2500 mg | INTRAMUSCULAR | Status: DC | PRN

## 2017-04-23 MED ORDER — FENTANYL CITRATE (PF) 250 MCG/5ML IJ SOLN
INTRAMUSCULAR | Status: AC
  Filled 2017-04-23: qty 5

## 2017-04-23 MED ORDER — ROSUVASTATIN CALCIUM 20 MG PO TABS
40.0000 mg | ORAL_TABLET | Freq: Every evening | ORAL | Status: DC
  Administered 2017-04-23 – 2017-04-24 (×2): 40 mg via ORAL
  Filled 2017-04-23 (×2): qty 2

## 2017-04-23 MED ORDER — CEFAZOLIN SODIUM-DEXTROSE 1-4 GM/50ML-% IV SOLN
1.0000 g | Freq: Four times a day (QID) | INTRAVENOUS | Status: AC
  Administered 2017-04-23 – 2017-04-24 (×2): 1 g via INTRAVENOUS
  Filled 2017-04-23 (×2): qty 50

## 2017-04-23 MED ORDER — TRANEXAMIC ACID 1000 MG/10ML IV SOLN
1000.0000 mg | INTRAVENOUS | Status: AC
  Administered 2017-04-23: 1000 mg via INTRAVENOUS
  Filled 2017-04-23: qty 1100

## 2017-04-23 MED ORDER — SODIUM CHLORIDE 0.9 % IV SOLN
INTRAVENOUS | Status: DC
  Administered 2017-04-23: 18:00:00 via INTRAVENOUS

## 2017-04-23 MED ORDER — SUGAMMADEX SODIUM 200 MG/2ML IV SOLN
INTRAVENOUS | Status: DC | PRN
  Administered 2017-04-23: 200 mg via INTRAVENOUS

## 2017-04-23 MED ORDER — METOCLOPRAMIDE HCL 5 MG/ML IJ SOLN: 5.0000 mg | Freq: Three times a day (TID) | INTRAMUSCULAR | Status: DC | PRN

## 2017-04-23 MED ORDER — FENTANYL CITRATE (PF) 100 MCG/2ML IJ SOLN
INTRAMUSCULAR | Status: AC
  Filled 2017-04-23: qty 2

## 2017-04-23 MED ORDER — FAMOTIDINE 20 MG PO TABS
20.0000 mg | ORAL_TABLET | Freq: Every day | ORAL | Status: DC
  Administered 2017-04-23 – 2017-04-24 (×2): 20 mg via ORAL
  Filled 2017-04-23 (×2): qty 1

## 2017-04-23 MED ORDER — SUCCINYLCHOLINE CHLORIDE 200 MG/10ML IV SOSY
PREFILLED_SYRINGE | INTRAVENOUS | Status: AC
  Filled 2017-04-23: qty 10

## 2017-04-23 MED ORDER — PHENYLEPHRINE 40 MCG/ML (10ML) SYRINGE FOR IV PUSH (FOR BLOOD PRESSURE SUPPORT)
PREFILLED_SYRINGE | INTRAVENOUS | Status: DC | PRN
  Administered 2017-04-23 (×2): 80 ug via INTRAVENOUS

## 2017-04-23 MED ORDER — ROCURONIUM BROMIDE 50 MG/5ML IV SOSY
PREFILLED_SYRINGE | INTRAVENOUS | Status: AC
  Filled 2017-04-23: qty 5

## 2017-04-23 MED ORDER — MENTHOL 3 MG MT LOZG: 1.0000 | LOZENGE | OROMUCOSAL | Status: DC | PRN

## 2017-04-23 MED ORDER — METOCLOPRAMIDE HCL 5 MG PO TABS: 5.0000 mg | ORAL_TABLET | Freq: Three times a day (TID) | ORAL | Status: DC | PRN

## 2017-04-23 MED ORDER — NITROGLYCERIN 0.4 MG SL SUBL: 0.4000 mg | SUBLINGUAL_TABLET | SUBLINGUAL | Status: DC | PRN

## 2017-04-23 MED ORDER — LOSARTAN POTASSIUM 50 MG PO TABS
50.0000 mg | ORAL_TABLET | Freq: Every day | ORAL | Status: DC
  Administered 2017-04-25: 50 mg via ORAL
  Filled 2017-04-23 (×2): qty 1

## 2017-04-23 MED ORDER — CEFAZOLIN SODIUM-DEXTROSE 2-4 GM/100ML-% IV SOLN
INTRAVENOUS | Status: AC
  Filled 2017-04-23: qty 100

## 2017-04-23 MED ORDER — HYDROMORPHONE HCL-NACL 0.5-0.9 MG/ML-% IV SOSY
1.0000 mg | PREFILLED_SYRINGE | INTRAVENOUS | Status: DC | PRN
  Administered 2017-04-23 – 2017-04-24 (×2): 1 mg via INTRAVENOUS
  Filled 2017-04-23 (×2): qty 2

## 2017-04-23 MED ORDER — HYDROMORPHONE HCL-NACL 0.5-0.9 MG/ML-% IV SOSY
PREFILLED_SYRINGE | INTRAVENOUS | Status: AC
  Filled 2017-04-23: qty 4

## 2017-04-23 MED ORDER — INSULIN ASPART 100 UNIT/ML ~~LOC~~ SOLN
0.0000 [IU] | Freq: Every day | SUBCUTANEOUS | Status: DC
  Administered 2017-04-23: 4 [IU] via SUBCUTANEOUS

## 2017-04-23 MED ORDER — MIDAZOLAM HCL 5 MG/5ML IJ SOLN
INTRAMUSCULAR | Status: DC | PRN
  Administered 2017-04-23 (×2): 1 mg via INTRAVENOUS

## 2017-04-23 MED ORDER — ACETAMINOPHEN 650 MG RE SUPP: 650.0000 mg | Freq: Four times a day (QID) | RECTAL | Status: DC | PRN

## 2017-04-23 MED ORDER — ONDANSETRON HCL 4 MG/2ML IJ SOLN
INTRAMUSCULAR | Status: AC
  Filled 2017-04-23: qty 2

## 2017-04-23 MED ORDER — HYDROMORPHONE HCL-NACL 0.5-0.9 MG/ML-% IV SOSY
0.2500 mg | PREFILLED_SYRINGE | INTRAVENOUS | Status: DC | PRN
  Administered 2017-04-23 (×4): 0.5 mg via INTRAVENOUS

## 2017-04-23 MED ORDER — PHENYLEPHRINE HCL 10 MG/ML IJ SOLN
INTRAMUSCULAR | Status: AC
  Filled 2017-04-23: qty 1

## 2017-04-23 MED ORDER — DEXAMETHASONE SODIUM PHOSPHATE 10 MG/ML IJ SOLN
INTRAMUSCULAR | Status: AC
  Filled 2017-04-23: qty 1

## 2017-04-23 MED ORDER — INSULIN ASPART 100 UNIT/ML ~~LOC~~ SOLN
0.0000 [IU] | Freq: Three times a day (TID) | SUBCUTANEOUS | Status: DC
Start: 2017-04-24 — End: 2017-04-25
  Administered 2017-04-24 – 2017-04-25 (×3): 3 [IU] via SUBCUTANEOUS

## 2017-04-23 MED ORDER — MIDAZOLAM HCL 2 MG/2ML IJ SOLN
INTRAMUSCULAR | Status: AC
  Filled 2017-04-23: qty 2

## 2017-04-23 MED ORDER — SUGAMMADEX SODIUM 200 MG/2ML IV SOLN
INTRAVENOUS | Status: AC
  Filled 2017-04-23: qty 2

## 2017-04-23 MED ORDER — METHOCARBAMOL 500 MG PO TABS
500.0000 mg | ORAL_TABLET | Freq: Four times a day (QID) | ORAL | Status: DC | PRN
  Administered 2017-04-23 – 2017-04-24 (×2): 500 mg via ORAL
  Filled 2017-04-23 (×2): qty 1

## 2017-04-23 MED ORDER — ISOSORBIDE MONONITRATE ER 30 MG PO TB24
30.0000 mg | ORAL_TABLET | Freq: Every evening | ORAL | Status: DC
  Administered 2017-04-23: 30 mg via ORAL
  Filled 2017-04-23 (×2): qty 1

## 2017-04-23 MED ORDER — SERTRALINE HCL 50 MG PO TABS
50.0000 mg | ORAL_TABLET | Freq: Every day | ORAL | Status: DC
  Administered 2017-04-24 – 2017-04-25 (×2): 50 mg via ORAL
  Filled 2017-04-23 (×2): qty 1

## 2017-04-23 MED ORDER — LIDOCAINE 2% (20 MG/ML) 5 ML SYRINGE
INTRAMUSCULAR | Status: DC | PRN
  Administered 2017-04-23: 80 mg via INTRAVENOUS

## 2017-04-23 MED ORDER — ALUM & MAG HYDROXIDE-SIMETH 200-200-20 MG/5ML PO SUSP: 30.0000 mL | ORAL | Status: DC | PRN

## 2017-04-23 MED ORDER — FENTANYL CITRATE (PF) 250 MCG/5ML IJ SOLN
INTRAMUSCULAR | Status: DC | PRN
  Administered 2017-04-23 (×3): 50 ug via INTRAVENOUS
  Administered 2017-04-23: 100 ug via INTRAVENOUS
  Administered 2017-04-23 (×2): 50 ug via INTRAVENOUS

## 2017-04-23 MED ORDER — ALBUMIN HUMAN 5 % IV SOLN
INTRAVENOUS | Status: AC
  Filled 2017-04-23: qty 250

## 2017-04-23 MED ORDER — LACTATED RINGERS IV SOLN
INTRAVENOUS | Status: DC
  Administered 2017-04-23 (×3): via INTRAVENOUS

## 2017-04-23 MED ORDER — PROPOFOL 10 MG/ML IV BOLUS
INTRAVENOUS | Status: DC | PRN
  Administered 2017-04-23: 100 mg via INTRAVENOUS

## 2017-04-23 MED ORDER — ROCURONIUM BROMIDE 10 MG/ML (PF) SYRINGE
PREFILLED_SYRINGE | INTRAVENOUS | Status: DC | PRN
  Administered 2017-04-23: 40 mg via INTRAVENOUS

## 2017-04-23 MED ORDER — PRASUGREL HCL 10 MG PO TABS
10.0000 mg | ORAL_TABLET | Freq: Every day | ORAL | Status: DC
  Administered 2017-04-24 – 2017-04-25 (×2): 10 mg via ORAL
  Filled 2017-04-23 (×2): qty 1

## 2017-04-23 MED ORDER — RIVAROXABAN 10 MG PO TABS: 10.0000 mg | ORAL_TABLET | Freq: Every day | ORAL | Status: DC

## 2017-04-23 MED ORDER — OXYCODONE HCL 5 MG PO TABS
5.0000 mg | ORAL_TABLET | ORAL | Status: DC | PRN
  Administered 2017-04-23: 10 mg via ORAL
  Administered 2017-04-23: 5 mg via ORAL
  Administered 2017-04-24: 10 mg via ORAL
  Administered 2017-04-24: 5 mg via ORAL
  Administered 2017-04-24 – 2017-04-25 (×3): 10 mg via ORAL
  Filled 2017-04-23: qty 2
  Filled 2017-04-23: qty 1
  Filled 2017-04-23: qty 2
  Filled 2017-04-23: qty 1
  Filled 2017-04-23 (×4): qty 2

## 2017-04-23 SURGICAL SUPPLY — 36 items
APL SKNCLS STERI-STRIP NONHPOA (GAUZE/BANDAGES/DRESSINGS)
BAG SPEC THK2 15X12 ZIP CLS (MISCELLANEOUS)
BAG ZIPLOCK 12X15 (MISCELLANEOUS) IMPLANT
BENZOIN TINCTURE PRP APPL 2/3 (GAUZE/BANDAGES/DRESSINGS) IMPLANT
BLADE SAW SGTL 18X1.27X75 (BLADE) ×2 IMPLANT
CAPT HIP TOTAL 2 ×1 IMPLANT
CELLS DAT CNTRL 66122 CELL SVR (MISCELLANEOUS) ×1 IMPLANT
COVER PERINEAL POST (MISCELLANEOUS) ×2 IMPLANT
COVER SURGICAL LIGHT HANDLE (MISCELLANEOUS) ×2 IMPLANT
DRAPE STERI IOBAN 125X83 (DRAPES) ×2 IMPLANT
DRAPE U-SHAPE 47X51 STRL (DRAPES) ×4 IMPLANT
DRSG AQUACEL AG ADV 3.5X10 (GAUZE/BANDAGES/DRESSINGS) ×2 IMPLANT
DURAPREP 26ML APPLICATOR (WOUND CARE) ×2 IMPLANT
ELECT REM PT RETURN 15FT ADLT (MISCELLANEOUS) ×2 IMPLANT
GAUZE XEROFORM 1X8 LF (GAUZE/BANDAGES/DRESSINGS) ×1 IMPLANT
GLOVE BIO SURGEON STRL SZ7.5 (GLOVE) ×2 IMPLANT
GLOVE BIOGEL PI IND STRL 8 (GLOVE) ×2 IMPLANT
GLOVE BIOGEL PI INDICATOR 8 (GLOVE) ×2
GLOVE ECLIPSE 8.0 STRL XLNG CF (GLOVE) ×2 IMPLANT
GOWN STRL REUS W/TWL XL LVL3 (GOWN DISPOSABLE) ×4 IMPLANT
HANDPIECE INTERPULSE COAX TIP (DISPOSABLE) ×2
HOLDER FOLEY CATH W/STRAP (MISCELLANEOUS) ×2 IMPLANT
PACK ANTERIOR HIP CUSTOM (KITS) ×2 IMPLANT
RETRACTOR WND ALEXIS 18 MED (MISCELLANEOUS) ×1 IMPLANT
RTRCTR WOUND ALEXIS 18CM MED (MISCELLANEOUS) ×2
SET HNDPC FAN SPRY TIP SCT (DISPOSABLE) ×1 IMPLANT
STAPLER VISISTAT 35W (STAPLE) IMPLANT
STRIP CLOSURE SKIN 1/2X4 (GAUZE/BANDAGES/DRESSINGS) IMPLANT
SUT ETHIBOND NAB CT1 #1 30IN (SUTURE) ×2 IMPLANT
SUT MNCRL AB 4-0 PS2 18 (SUTURE) IMPLANT
SUT VIC AB 0 CT1 36 (SUTURE) ×2 IMPLANT
SUT VIC AB 1 CT1 36 (SUTURE) ×2 IMPLANT
SUT VIC AB 2-0 CT1 27 (SUTURE) ×4
SUT VIC AB 2-0 CT1 TAPERPNT 27 (SUTURE) ×2 IMPLANT
TRAY FOLEY W/METER SILVER 16FR (SET/KITS/TRAYS/PACK) ×2 IMPLANT
YANKAUER SUCT BULB TIP 10FT TU (MISCELLANEOUS) ×2 IMPLANT

## 2017-04-23 NOTE — Anesthesia Postprocedure Evaluation (Signed)
Anesthesia Post Note  Patient: AN LANNAN  Procedure(s) Performed: Procedure(s) (LRB): LEFT TOTAL HIP ARTHROPLASTY ANTERIOR APPROACH (Left)     Patient location during evaluation: PACU Anesthesia Type: General Level of consciousness: awake and alert and oriented Pain management: pain level controlled Vital Signs Assessment: post-procedure vital signs reviewed and stable Respiratory status: spontaneous breathing, nonlabored ventilation, respiratory function stable and patient connected to nasal cannula oxygen Cardiovascular status: blood pressure returned to baseline and stable Postop Assessment: no signs of nausea or vomiting Anesthetic complications: no    Last Vitals:  Vitals:   04/23/17 1600 04/23/17 1615  BP: 128/74 119/80  Pulse: 83 81  Resp: 18 15  Temp:    SpO2: 98% 99%    Last Pain:  Vitals:   04/23/17 1625  TempSrc:   PainSc: Asleep                 Tenecia Ignasiak A.

## 2017-04-23 NOTE — Anesthesia Procedure Notes (Signed)
Arterial Line Insertion Start/End8/17/2018 2:10 PM, 04/23/2017 2:20 PM Performed by: Lollie Sails, CRNA  Patient location: OR. Preanesthetic checklist: patient identified, IV checked, site marked, risks and benefits discussed, surgical consent, monitors and equipment checked, pre-op evaluation, timeout performed and anesthesia consent Patient sedated Left, radial was placed Catheter size: 20 G Hand hygiene performed  and maximum sterile barriers used   Attempts: 5 or more Procedure performed without using ultrasound guided technique. Following insertion, dressing applied. Post procedure assessment: normal  Patient tolerated the procedure well with no immediate complications. Additional procedure comments: Attempts by S. [place] Ayesha Rumpf CRNA and Dr. Royce Macadamia in preop area to right radial with no success.   Patient comfortable during procedure and tolerated well.     After induction in OR, attempt by K Key CRNA without success to left radial.    Attempt by S. Briony Parveen CRNA left radial with success.   Marland Kitchen

## 2017-04-23 NOTE — Brief Op Note (Signed)
04/23/2017  3:26 PM  PATIENT:  Sarah Stevenson  61 y.o. female  PRE-OPERATIVE DIAGNOSIS:  severe left hip osteoarthritis  POST-OPERATIVE DIAGNOSIS:  severe left hip osteoarthritis  PROCEDURE:  Procedure(s): LEFT TOTAL HIP ARTHROPLASTY ANTERIOR APPROACH (Left)  SURGEON:  Surgeon(s) and Role:    Mcarthur Rossetti, MD - Primary  PHYSICIAN ASSISTANT: Benita Stabile, PA-C  ANESTHESIA:   general  EBL:  Total I/O In: 1000 [I.V.:1000] Out: 725 [Urine:375; Blood:350]  COUNTS:  YES  DICTATION: .Other Dictation: Dictation Number [id number]  PLAN OF CARE: Admit to inpatient   PATIENT DISPOSITION:  PACU - hemodynamically stable.   Delay start of Pharmacological VTE agent (>24hrs) due to surgical blood loss or risk of bleeding: no

## 2017-04-23 NOTE — Discharge Instructions (Addendum)

## 2017-04-23 NOTE — H&P (Signed)
TOTAL HIP ADMISSION H&P  Patient is admitted for left total hip arthroplasty.  Subjective:  Chief Complaint: left hip pain  HPI: Sarah Stevenson, 61 y.o. female, has a history of pain and functional disability in the left hip(s) due to arthritis and patient has failed non-surgical conservative treatments for greater than 12 weeks to include NSAID's and/or analgesics, corticosteriod injections, use of assistive devices, weight reduction as appropriate and activity modification.  Onset of symptoms was gradual starting 3 years ago with gradually worsening course since that time.The patient noted no past surgery on the left hip(s).  Patient currently rates pain in the left hip at 10 out of 10 with activity. Patient has night pain, worsening of pain with activity and weight bearing, trendelenberg gait, pain that interfers with activities of daily living, pain with passive range of motion and crepitus. Patient has evidence of subchondral cysts, subchondral sclerosis, periarticular osteophytes and joint space narrowing by imaging studies. This condition presents safety issues increasing the risk of falls.  There is no current active infection.  Patient Active Problem List   Diagnosis Date Noted  . Pain in left hip 03/31/2017  . Unilateral primary osteoarthritis, left hip 03/31/2017  . Sciatica, left side 03/31/2017  . Aortic stenosis, moderate   . Positive cardiac stress test   . Trochanteric bursitis of left hip 08/14/2015  . Arthritis of left hip 08/14/2015  . Excessive daytime sleepiness 03/03/2015  . Snoring 12/20/2014  . CAD (coronary artery disease), native coronary artery 12/19/2014  . Type 2 diabetes mellitus with complication ([place])   . Hyperlipidemia   . Murmur, cardiac    Past Medical History:  Diagnosis Date  . Anginal pain ([place])   . Coronary artery disease   . Depression    "mild" (08/06/2016)  . Hyperlipidemia   . Murmur, cardiac   . Myocardial infarction ([place]) 2016  .  Osteoarthritis    "left hip and knee" (08/06/2016)  . Type II diabetes mellitus ([place])     Past Surgical History:  Procedure Laterality Date  . CARDIAC CATHETERIZATION Right 11/30/2014   Procedure: RIGHT/LEFT HEART CATH AND CORONARY ANGIOGRAPHY;  Surgeon: Belva Crome, MD;  Location: [facility] CATH LAB;  Service: Cardiovascular;  Laterality: Right;  . CARDIAC CATHETERIZATION N/A 12/13/2015   Procedure: Left Heart Cath and Coronary Angiography;  Surgeon: Leonie Man, MD;  Location: [place] CV LAB;  Service: Cardiovascular;  Laterality: N/A;  . CARDIAC CATHETERIZATION N/A 12/13/2015   Procedure: Intravascular Pressure Wire/FFR Study;  Surgeon: Leonie Man, MD;  Location: [place] CV LAB;  Service: Cardiovascular;  Laterality: N/A;  RCA  . CARDIAC CATHETERIZATION N/A 08/06/2016   Procedure: Right/Left Heart Cath and Coronary Angiography;  Surgeon: Lorretta Harp, MD;  Location: [place] CV LAB;  Service: Cardiovascular;  Laterality: N/A;  . CARDIAC CATHETERIZATION N/A 08/06/2016   Procedure: Coronary Stent Intervention;  Surgeon: Lorretta Harp, MD;  Location: [place] CV LAB;  Service: Cardiovascular;  Laterality: N/A;   distal rca 3.0x16 and 3.0x8 synergy  . CESAREAN SECTION  1989  . CORONARY ANGIOPLASTY WITH STENT PLACEMENT  08/06/2016   "2 stents"  . HYSTEROSCOPY DIAGNOSTIC  1990s  . JOINT REPLACEMENT    . KNEE ARTHROSCOPY Left 1996  . NASAL SINUS SURGERY  1977    Prescriptions Prior to Admission  Medication Sig Dispense Refill Last Dose  . acetaminophen (TYLENOL) 325 MG tablet Take 650 mg by mouth every 6 (six) hours as needed for mild pain or  headache.   04/23/2017 at 0900  . carvedilol (COREG) 3.125 MG tablet TAKE 1 TABLET BY MOUTH TWICE DAILY WITH A MEAL (Patient taking differently: Take 3.125 mg by mouth 2 (two) times daily with a meal. ) 180 tablet 3 04/23/2017 at 0800  . celecoxib (CELEBREX) 200 MG capsule Take 200 mg by mouth daily.   04/20/2017  . cetirizine (ZYRTEC)  10 MG tablet Take 10 mg by mouth daily.   04/23/2017 at 0800  . Dulaglutide (TRULICITY) 1.44 YJ/8.5UD SOPN Inject 0.75 mg into the skin every 7 (seven) days. sunday   04/18/2017  . glipiZIDE (GLUCOTROL XL) 10 MG 24 hr tablet Take 10 mg by mouth daily with breakfast.    04/22/2017 at 1000  . isosorbide mononitrate (IMDUR) 30 MG 24 hr tablet Take 30 mg by mouth every evening.   3 04/22/2017 at 2200  . losartan (COZAAR) 50 MG tablet Take 50 mg by mouth daily.    04/22/2017 at 1000  . Menthol-Methyl Salicylate (MUSCLE RUB EX) Apply 1 application topically as needed (for pain).   Past Month at Unknown time  . metFORMIN (GLUCOPHAGE-XR) 750 MG 24 hr tablet Take 1,500 mg by mouth every evening.   04/22/2017 at 2200  . methocarbamol (ROBAXIN) 500 MG tablet Take 1 tablet (500 mg total) by mouth 3 (three) times daily as needed for muscle spasms. (Patient taking differently: Take 250-500 mg by mouth 3 (three) times daily as needed for muscle spasms. Depends on pain if takes 0.5-1 tablet) 90 tablet 3 04/21/2017 at 2200  . Misc Natural Products (TURMERIC CURCUMIN) CAPS Take 2 capsules by mouth daily. 2400 mg total   04/22/2017 at 1000  . prasugrel (EFFIENT) 10 MG TABS tablet Take 1 tablet (10 mg total) by mouth daily. 90 tablet 3 04/19/2017  . ranitidine (ZANTAC) 150 MG tablet Take 150 mg by mouth every evening.    04/22/2017 at 2200  . rosuvastatin (CRESTOR) 40 MG tablet Take 1 tablet (40 mg total) by mouth daily. (Patient taking differently: Take 40 mg by mouth every evening. ) 90 tablet 3 04/22/2017 at 2200  . sertraline (ZOLOFT) 50 MG tablet Take 50 mg by mouth daily.   04/23/2017 at 0800  . nitroGLYCERIN (NITROSTAT) 0.4 MG SL tablet Place 1 tablet (0.4 mg total) under the tongue every 5 (five) minutes as needed for chest pain (Call 911 at 3rd dose within 15 minutes.). 25 tablet 3 Unknown at Unknown time  . oxyCODONE-acetaminophen (ROXICET) 5-325 MG tablet Take 0.5 tablets by mouth every 8 (eight) hours as needed for severe  pain. (Patient not taking: Reported on 04/12/2017) 15 tablet 0 Not Taking at Unknown time   Allergies  Allergen Reactions  . Asa [Aspirin] Hives and Rash  . Blue Dyes (Parenteral) Hives  . Corn-Containing Products Hives  . Hetastarch Hives and Rash  . Isosulfan Blue Rash  . Nsaids Hives and Rash  . Sugar-Protein-Starch Hives  . Tea Hives    Social History  Substance Use Topics  . Smoking status: Former Smoker    Packs/day: 1.00    Years: 10.00    Types: Cigarettes    Quit date: 35  . Smokeless tobacco: Never Used  . Alcohol use 4.2 oz/week    7 Shots of liquor per week    Family History  Problem Relation Age of Onset  . Heart attack Mother   . Heart attack Father   . Sudden death Father   . Hypertension Neg Hx   . Hyperlipidemia Neg  Hx   . Diabetes Neg Hx      Review of Systems  Musculoskeletal: Positive for joint pain.  All other systems reviewed and are negative.   Objective:  Physical Exam  Constitutional: She is oriented to person, place, and time. She appears well-developed and well-nourished.  HENT:  Head: Normocephalic and atraumatic.  Eyes: Pupils are equal, round, and reactive to light. EOM are normal.  Neck: Normal range of motion. Neck supple.  Cardiovascular: Normal rate and regular rhythm.   Respiratory: Effort normal and breath sounds normal.  GI: Soft. Bowel sounds are normal.  Musculoskeletal:       Left hip: She exhibits decreased range of motion, decreased strength, tenderness and bony tenderness.  Neurological: She is alert and oriented to person, place, and time.  Skin: Skin is warm and dry.  Psychiatric: She has a normal mood and affect.    Vital signs in last 24 hours: Temp:  [98.2 F (36.8 C)] 98.2 F (36.8 C) (08/17 1248) Pulse Rate:  [82] 82 (08/17 1248) Resp:  [18] 18 (08/17 1248) BP: (137)/(75) 137/75 (08/17 1248) SpO2:  [94 %] 94 % (08/17 1248)  Labs:   Estimated body mass index is 37.62 kg/m as calculated from the  following:   Height as of 04/14/17: 5\' 3"  (1.6 m).   Weight as of 04/14/17: 212 lb 6 oz (96.3 kg).   Imaging Review Plain radiographs demonstrate severe degenerative joint disease of the left hip(s). The bone quality appears to be excellent for age and reported activity level.  Assessment/Plan:  End stage arthritis, left hip(s)  The patient history, physical examination, clinical judgement of the provider and imaging studies are consistent with end stage degenerative joint disease of the left hip(s) and total hip arthroplasty is deemed medically necessary. The treatment options including medical management, injection therapy, arthroscopy and arthroplasty were discussed at length. The risks and benefits of total hip arthroplasty were presented and reviewed. The risks due to aseptic loosening, infection, stiffness, dislocation/subluxation,  thromboembolic complications and other imponderables were discussed.  The patient acknowledged the explanation, agreed to proceed with the plan and consent was signed. Patient is being admitted for inpatient treatment for surgery, pain control, PT, OT, prophylactic antibiotics, VTE prophylaxis, progressive ambulation and ADL's and discharge planning.The patient is planning to be discharged home with home health services

## 2017-04-23 NOTE — Transfer of Care (Signed)
Immediate Anesthesia Transfer of Care Note  Patient: Sarah Stevenson  Procedure(s) Performed: Procedure(s): LEFT TOTAL HIP ARTHROPLASTY ANTERIOR APPROACH (Left)  Patient Location: PACU  Anesthesia Type:General  Level of Consciousness: awake, alert , oriented and patient cooperative  Airway & Oxygen Therapy: Patient Spontanous Breathing and Patient connected to face mask oxygen  Post-op Assessment: Report given to RN and Post -op Vital signs reviewed and stable  Post vital signs: Reviewed and stable  Last Vitals:  Vitals:   04/23/17 1248  BP: 137/75  Pulse: 82  Resp: 18  Temp: 36.8 C  SpO2: 94%    Last Pain:  Vitals:   04/23/17 1248  TempSrc: Oral         Complications: No apparent anesthesia complications

## 2017-04-23 NOTE — Anesthesia Preprocedure Evaluation (Signed)
Anesthesia Evaluation  Patient identified by MRN, date of birth, ID band Patient awake    Reviewed: Allergy & Precautions, NPO status , Patient's Chart, lab work & pertinent test results, reviewed documented beta blocker date and time   Airway Mallampati: II       Dental no notable dental hx. (+) Teeth Intact, Caps   Pulmonary former smoker,    Pulmonary exam normal breath sounds clear to auscultation       Cardiovascular hypertension, Pt. on home beta blockers and Pt. on medications + angina with exertion + CAD and + Past MI  + Valvular Problems/Murmurs AS  Rhythm:Regular Rate:Normal + Systolic murmurs    Neuro/Psych PSYCHIATRIC DISORDERS Depression  Neuromuscular disease    GI/Hepatic Neg liver ROS,   Endo/Other  diabetes, Well Controlled, Type 2, Oral Hypoglycemic AgentsHyperlipidemia  Renal/GU negative Renal ROS     Musculoskeletal  (+) Arthritis , Osteoarthritis,  OA left hip   Abdominal (+) + obese,   Peds  Hematology negative hematology ROS (+)   Anesthesia Other Findings   Reproductive/Obstetrics                             Anesthesia Physical Anesthesia Plan  ASA: III  Anesthesia Plan: General   Post-op Pain Management:    Induction: Intravenous  PONV Risk Score and Plan: 4 or greater and Ondansetron, Dexamethasone, Midazolam, Scopolamine patch - Pre-op and Propofol infusion  Airway Management Planned: Oral ETT  Additional Equipment: Arterial line  Intra-op Plan:   Post-operative Plan: Extubation in OR  Informed Consent: I have reviewed the patients History and Physical, chart, labs and discussed the procedure including the risks, benefits and alternatives for the proposed anesthesia with the patient or authorized representative who has indicated his/her understanding and acceptance.   Dental advisory given  Plan Discussed with: CRNA, Anesthesiologist and  Surgeon  Anesthesia Plan Comments:         Anesthesia Quick Evaluation

## 2017-04-23 NOTE — Anesthesia Procedure Notes (Signed)
Procedure Name: Intubation Date/Time: 04/23/2017 2:04 PM Performed by: Lollie Sails Pre-anesthesia Checklist: Patient identified, Emergency Drugs available, Suction available, Patient being monitored and Timeout performed Patient Re-evaluated:Patient Re-evaluated prior to induction Oxygen Delivery Method: Circle system utilized Preoxygenation: Pre-oxygenation with 100% oxygen Induction Type: IV induction Ventilation: Mask ventilation without difficulty Laryngoscope Size: Miller and 2 Grade View: Grade I Tube type: Oral Tube size: 7.5 mm Number of attempts: 1 Airway Equipment and Method: Stylet Placement Confirmation: ETT inserted through vocal cords under direct vision,  positive ETCO2 and breath sounds checked- equal and bilateral Tube secured with: Tape Dental Injury: Teeth and Oropharynx as per pre-operative assessment

## 2017-04-24 LAB — BASIC METABOLIC PANEL
ANION GAP: 8 (ref 5–15)
BUN: 18 mg/dL (ref 6–20)
CALCIUM: 8.1 mg/dL — AB (ref 8.9–10.3)
CO2: 24 mmol/L (ref 22–32)
Chloride: 103 mmol/L (ref 101–111)
Creatinine, Ser: 1.23 mg/dL — ABNORMAL HIGH (ref 0.44–1.00)
GFR calc Af Amer: 54 mL/min — ABNORMAL LOW (ref >60)
GFR, EST NON AFRICAN AMERICAN: 47 mL/min — AB (ref >60)
GLUCOSE: 192 mg/dL — AB (ref 65–99)
Potassium: 4.1 mmol/L (ref 3.5–5.1)
SODIUM: 135 mmol/L (ref 135–145)

## 2017-04-24 LAB — GLUCOSE, CAPILLARY
GLUCOSE-CAPILLARY: 161 mg/dL — AB (ref 65–99)
Glucose-Capillary: 182 mg/dL — ABNORMAL HIGH (ref 65–99)
Glucose-Capillary: 118 mg/dL — ABNORMAL HIGH (ref 65–99)
Glucose-Capillary: 200 mg/dL — ABNORMAL HIGH (ref 65–99)

## 2017-04-24 LAB — CBC
HCT: 30.3 % — ABNORMAL LOW (ref 36.0–46.0)
Hemoglobin: 10.1 g/dL — ABNORMAL LOW (ref 12.0–15.0)
MCH: 30.9 pg (ref 26.0–34.0)
MCHC: 33.3 g/dL (ref 30.0–36.0)
MCV: 92.7 fL (ref 78.0–100.0)
PLATELETS: 175 10*3/uL (ref 150–400)
RBC: 3.27 MIL/uL — ABNORMAL LOW (ref 3.87–5.11)
RDW: 12.4 % (ref 11.5–15.5)
WBC: 12 10*3/uL — AB (ref 4.0–10.5)

## 2017-04-24 MED ORDER — OXYCODONE-ACETAMINOPHEN 5-325 MG PO TABS: 1.0000 | ORAL_TABLET | ORAL | 0 refills | Status: DC | PRN

## 2017-04-24 MED ORDER — METHOCARBAMOL 500 MG PO TABS: 500.0000 mg | ORAL_TABLET | Freq: Four times a day (QID) | ORAL | 0 refills | Status: DC | PRN

## 2017-04-24 NOTE — Evaluation (Signed)
Occupational Therapy Evaluation Patient Details Name: Sarah Stevenson MRN: [id number] DOB: 1956-01-27 Today's Date: 04/24/2017    History of Present Illness s/p L THA   Clinical Impression   OT education complete. No further OT needed    Follow Up Recommendations  No OT follow up    Equipment Recommendations  3 in 1 bedside commode    Recommendations for Other Services       Precautions / Restrictions Precautions Precautions: Fall Restrictions Weight Bearing Restrictions: No      Mobility Bed Mobility Overal bed mobility: Needs Assistance Bed Mobility: Sit to Supine       Sit to supine: Min assist   General bed mobility comments: assist with scooting and LLE, incr time  Transfers Overall transfer level: Needs assistance Equipment used: Rolling walker (2 wheeled) Transfers: Sit to/from [facility] Sit to Stand: Supervision Stand pivot transfers: Supervision       General transfer comment: cues for hand placement and LE position        ADL either performed or assessed with clinical judgement   ADL Overall ADL's : Needs assistance/impaired     Grooming: Set up;Sitting   Upper Body Bathing: Set up;Sitting   Lower Body Bathing: Minimal assistance;Sit to/from stand;Cueing for safety;Cueing for sequencing;Cueing for compensatory techniques   Upper Body Dressing : Set up;Sitting   Lower Body Dressing: Minimal assistance;Sit to/from stand;Cueing for safety;With adaptive equipment;Cueing for sequencing;Cueing for compensatory techniques       Toileting- Clothing Manipulation and Hygiene: Sit to/from stand;Supervision/safety;Cueing for sequencing;Cueing for safety     Tub/Shower Transfer Details (indicate cue type and reason): verbalized safety   General ADL Comments: pt will have A at home as needed.  Pt will obtain AE and OT ordered 3 n 1     Vision Patient Visual Report: No change from baseline       Perception     Praxis       Pertinent Vitals/Pain Pain Score: 3  Pain Location: L hip  Pain Descriptors / Indicators: Sore;Aching Pain Intervention(s): Limited activity within patient's tolerance;Monitored during session;Ice applied        Extremity/Trunk Assessment Upper Extremity Assessment Upper Extremity Assessment: Overall WFL for tasks assessed   Lower Extremity Assessment Lower Extremity Assessment: LLE deficits/detail LLE Deficits / Details: ankle WFL; knee and HIP AAROM grossly WFL, strength ~2+/5, limited by post op pain and nausea       Communication Communication Communication: No difficulties   Cognition Arousal/Alertness: Awake/alert Behavior During Therapy: WFL for tasks assessed/performed Overall Cognitive Status: Within Functional Limits for tasks assessed                                     General Comments       Exercises Total Joint Exercises Ankle Circles/Pumps: AROM;Both;10 reps Quad Sets: AROM;Both;5 reps   Shoulder Instructions      Home Living Family/patient expects to be discharged to:: Private residence Living Arrangements: Alone Available Help at Discharge: Friend(s) (friends staying as needed) Type of Home: House Home Access: Stairs to enter Technical brewer of Steps: 2   Home Layout: One level     Bathroom Shower/Tub: Tub/shower unit         Home Equipment: Environmental consultant - 4 wheels   Additional Comments: borrowed rollator      Prior Functioning/Environment Level of Independence: Independent        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[place]        OT Problem List:        OT Treatment/Interventions:      OT Goals(Current goals can be found in the care plan section) Acute Rehab OT Goals Patient Stated Goal: less pain  OT Frequency:                AM-PAC PT "6 Clicks" Daily Activity     Outcome Measure Help from another person eating meals?: None Help from another person taking care of personal grooming?: None Help  from another person toileting, which includes using toliet, bedpan, or urinal?: A Little Help from another person bathing (including washing, rinsing, drying)?: None Help from another person to put on and taking off regular upper body clothing?: A Little Help from another person to put on and taking off regular lower body clothing?: A Little 6 Click Score: 21   End of Session Equipment Utilized During Treatment: Rolling walker Nurse Communication: Mobility status  Activity Tolerance: Patient tolerated treatment well Patient left: in bed;with call bell/phone within reach;with family/visitor present  OT Visit Diagnosis: Unsteadiness on feet (R26.81)                Charges:  OT General Charges $OT Visit: 1 Procedure OT Evaluation $OT Eval Low Complexity: 1 Procedure G-Codes:     Sarah Stevenson, OT [phone]  Sarah Stevenson D 04/24/2017, 3:34 PM

## 2017-04-24 NOTE — Progress Notes (Signed)
Subjective: 1 Day Post-Op Procedure(s) (LRB): LEFT TOTAL HIP ARTHROPLASTY ANTERIOR APPROACH (Left) Patient reports pain as moderate.    Objective: Vital signs in last 24 hours: Temp:  [97.5 F (36.4 C)-98.9 F (37.2 C)] 98.6 F (37 C) (08/18 0539) Pulse Rate:  [71-89] 89 (08/18 0539) Resp:  [15-24] 17 (08/18 0539) BP: (99-137)/(54-80) 116/59 (08/18 0539) SpO2:  [93 %-99 %] 97 % (08/18 0539) Weight:  [212 lb (96.2 kg)] 212 lb (96.2 kg) (08/17 1312)  Intake/Output from previous day: 08/17 0701 - 08/18 0700 In: 4420 [P.O.:920; I.V.:3235; IV Piggyback:265] Out: 2500 [Urine:2150; Blood:350] Intake/Output this shift: No intake/output data recorded.   Recent Labs  04/24/17 0514  HGB 10.1*    Recent Labs  04/24/17 0514  WBC 12.0*  RBC 3.27*  HCT 30.3*  PLT 175    Recent Labs  04/24/17 0514  NA 135  K 4.1  CL 103  CO2 24  BUN 18  CREATININE 1.23*  GLUCOSE 192*  CALCIUM 8.1*   No results for input(s): LABPT, INR in the last 72 hours.  Sensation intact distally Intact pulses distally Dorsiflexion/Plantar flexion intact Incision: dressing C/D/I  Assessment/Plan: 1 Day Post-Op Procedure(s) (LRB): LEFT TOTAL HIP ARTHROPLASTY ANTERIOR APPROACH (Left) Up with therapy Plan for discharge tomorrow Discharge home with home health  Mcarthur Rossetti 04/24/2017, 7:23 AM

## 2017-04-24 NOTE — Progress Notes (Signed)
   04/24/17 1600  PT Visit Information  Last PT Received On 04/24/17  Assistance Needed +1  History of Present Illness s/p L THA  Subjective Data  Patient Stated Goal less pain  Precautions  Precautions Fall  Restrictions  Weight Bearing Restrictions No  Pain Assessment  Pain Assessment 0-10  Pain Score 3  Pain Location L hip   Pain Descriptors / Indicators Sore;Aching  Pain Intervention(s) Limited activity within patient's tolerance;Monitored during session;Repositioned;Ice applied  Cognition  Arousal/Alertness Awake/alert  Behavior During Therapy WFL for tasks assessed/performed  Overall Cognitive Status Within Functional Limits for tasks assessed  Bed Mobility  Overal bed mobility Needs Assistance  Bed Mobility Supine to Sit  Sit to supine Min assist  General bed mobility comments assist with scooting and LLE, incr time  Transfers  Overall transfer level Needs assistance  Equipment used Rolling walker (2 wheeled)  Transfers Sit to/from Stand  Sit to Stand Supervision  General transfer comment cues for hand placement and LE position  Ambulation/Gait  Ambulation/Gait assistance Min guard;Supervision  Ambulation Distance (Feet) 55 Feet  Assistive device Rolling walker (2 wheeled)  Gait Pattern/deviations Step-to pattern;Step-through pattern  General Gait Details cues for sequence, RW position from self and safety with turns  Total Joint Exercises  Ankle Circles/Pumps AROM;Both;10 reps  Quad Sets AROM;Strengthening;10 reps  Heel Slides AAROM;Left;10 reps  Hip ABduction/ADduction AAROM;Left;10 reps  PT - End of Session  Equipment Utilized During Treatment Gait belt  Activity Tolerance Patient tolerated treatment well  Patient left in chair;with call bell/phone within reach;with family/visitor present (left inchair per OT request)  PT - Assessment/Plan  PT Plan Current plan remains appropriate  PT Visit Diagnosis Difficulty in walking, not elsewhere classified (R26.2)   PT Frequency (ACUTE ONLY) 7X/week  Follow Up Recommendations DC plan and follow up therapy as arranged by surgeon;Home health PT  PT equipment Rolling walker with 5" wheels;3in1 (PT)  AM-PAC PT "6 Clicks" Daily Activity Outcome Measure  Difficulty turning over in bed (including adjusting bedclothes, sheets and blankets)? 1  Difficulty moving from lying on back to sitting on the side of the bed?  1  Difficulty sitting down on and standing up from a chair with arms (e.g., wheelchair, bedside commode, etc,.)? 1  Help needed moving to and from a bed to chair (including a wheelchair)? 3  Help needed walking in hospital room? 3  Help needed climbing 3-5 steps with a railing?  2  6 Click Score 11  Mobility G Code  CL  PT Goal Progression  Progress towards PT goals Progressing toward goals  Acute Rehab PT Goals  PT Goal Formulation With patient  Time For Goal Achievement 05/01/17  Potential to Achieve Goals Good  PT Time Calculation  PT Start Time (ACUTE ONLY) 1401  PT Stop Time (ACUTE ONLY) 1425  PT Time Calculation (min) (ACUTE ONLY) 24 min  PT General Charges  $$ ACUTE PT VISIT 1 Visit  PT Treatments  $Gait Training 8-22 mins  $Therapeutic Exercise 8-22 mins

## 2017-04-24 NOTE — Evaluation (Signed)
Physical Therapy Evaluation Patient Details Name: Sarah Stevenson MRN: [id number] DOB: [birth year] Today's Date: 04/24/2017   History of Present Illness  s/p L THA  Clinical Impression  Pt is s/p THA resulting in the deficits listed below (see PT Problem List).  Pt will benefit from skilled PT to increase their independence and safety with mobility to allow discharge to the venue listed below.  Pt amb  ~50' with RW and min assist, should progress well, she is motivated to work with PT; will continue to follow in acute setting     Follow Up Recommendations DC plan and follow up therapy as arranged by surgeon;Home health PT    Equipment Recommendations  Rolling walker with 5" wheels;3in1 (PT)    Recommendations for Other Services       Precautions / Restrictions Precautions Precautions: Fall Restrictions Weight Bearing Restrictions: No      Mobility  Bed Mobility Overal bed mobility: Needs Assistance Bed Mobility: Supine to Sit           General bed mobility comments: assist with scooting and LLE, incr time  Transfers Overall transfer level: Needs assistance Equipment used: Rolling walker (2 wheeled) Transfers: Sit to/from Stand           General transfer comment: cues for hand placement and LE position  Ambulation/Gait Ambulation/Gait assistance: Min assist;Min guard Ambulation Distance (Feet): 50 Feet Assistive device: Rolling walker (2 wheeled) Gait Pattern/deviations: Step-to pattern;Antalgic     General Gait Details: cues for sequence, RW position from self adn safety with turns  Stairs            Wheelchair Mobility    Modified Rankin (Stroke Patients Only)       Balance                                             Pertinent Vitals/Pain Pain Assessment: 0-10 Pain Score: 3  Pain Location: L hip  Pain Descriptors / Indicators: Sore;Aching Pain Intervention(s): Limited activity within patient's tolerance;Monitored  during session;Premedicated before session;Ice applied    Home Living Family/patient expects to be discharged to:: Private residence Living Arrangements: Alone Available Help at Discharge: Friend(s) (friends staying as needed) Type of Home: House Home Access: Stairs to enter   Technical brewer of Steps: 2 Home Layout: One level Home Equipment: Environmental consultant - 4 wheels;Grab bars - tub/shower Additional Comments: borrowed rollator    Prior Function Level of Independence: Independent         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, sports at [place]        Extremity/Trunk Assessment   Upper Extremity Assessment Upper Extremity Assessment: Defer to OT evaluation    Lower Extremity Assessment Lower Extremity Assessment: LLE deficits/detail LLE Deficits / Details: ankle WFL; knee and HIP AAROM grossly WFL, strength ~2+/5, limited by post op pain and nausea       Communication   Communication: No difficulties  Cognition Arousal/Alertness: Awake/alert Behavior During Therapy: WFL for tasks assessed/performed Overall Cognitive Status: Within Functional Limits for tasks assessed                                        General Comments      Exercises Total Joint Exercises Ankle Circles/Pumps:  AROM;Both;10 reps Quad Sets: AROM;Both;5 reps   Assessment/Plan    PT Assessment Patient needs continued PT services  PT Problem List Decreased strength;Decreased range of motion;Decreased activity tolerance;Decreased mobility;Pain       PT Treatment Interventions DME instruction;Gait training;Functional mobility training;Therapeutic activities;Therapeutic exercise;Patient/family education;Stair training    PT Goals (Current goals can be found in the Care Plan section)  Acute Rehab PT Goals Patient Stated Goal: less pain PT Goal Formulation: With patient Time For Goal Achievement: 05/01/17 Potential to Achieve Goals: Good    Frequency 7X/week    Barriers to discharge        Co-evaluation               AM-PAC PT "6 Clicks" Daily Activity  Outcome Measure Difficulty turning over in bed (including adjusting bedclothes, sheets and blankets)?: Unable Difficulty moving from lying on back to sitting on the side of the bed? : Unable Difficulty sitting down on and standing up from a chair with arms (e.g., wheelchair, bedside commode, etc,.)?: Unable Help needed moving to and from a bed to chair (including a wheelchair)?: A Little Help needed walking in hospital room?: A Little Help needed climbing 3-5 steps with a railing? : A Lot 6 Click Score: 11    End of Session Equipment Utilized During Treatment: Gait belt Activity Tolerance: Patient tolerated treatment well Patient left: in chair;with call bell/phone within reach;with family/visitor present   PT Visit Diagnosis: Difficulty in walking, not elsewhere classified (R26.2)    Time: 5916-3846 PT Time Calculation (min) (ACUTE ONLY): 30 min   Charges:   PT Evaluation $PT Eval Low Complexity: 1 Low PT Treatments $Gait Training: 8-22 mins   PT G Codes:          Tom Ragsdale 05/23/17, 1:22 PM

## 2017-04-24 NOTE — Care Management Note (Addendum)
Case Management Note  Patient Details  Name: Sarah Stevenson MRN: [id number] Date of Birth: 12/19/1955  Subjective/Objective:                  LEFT TOTAL HIP ARTHROPLASTY ANTERIOR APPROACH (Left Hip)  Action/Plan: Discharge to Home  Expected Discharge Date:              Expected Discharge Plan:  [place]  In-House Referral:     Discharge planning Services  CM Consult  Post Acute Care Choice:  Home Health Choice offered to:  Patient  DME Arranged:  3-N-1Gilford Rile DME Agency:  [facility] (now [facility])  [place] Arranged:  PT [place]:  [facility] (now [facility])  Status of Service:  In process, will continue to follow  If discussed at Long Length of Stay Meetings, dates discussed:    Additional Comments: Spoke to patient about home health services, patient had no preferences [place] contacted for [place] , spoke to rep Butch Penny.  Contacted [facility] for DME roling walker and 3 in 1.  Verified home address and contact information.  Patient stating she would have assistance at home. No other CM needs at this time.    Rayburn Ma RN, BSN, CM  Corine Shelter, RN 04/24/2017, 5:48 PM

## 2017-04-25 LAB — GLUCOSE, CAPILLARY: Glucose-Capillary: 164 mg/dL — ABNORMAL HIGH (ref 65–99)

## 2017-04-25 MED ORDER — NON FORMULARY: Freq: Every day | Status: DC

## 2017-04-25 MED ORDER — ACETAMINOPHEN 325 MG PO TABS: 650.0000 mg | ORAL_TABLET | ORAL | Status: DC | PRN

## 2017-04-25 MED ORDER — CETIRIZINE HCL 10 MG PO TABS
10.0000 mg | ORAL_TABLET | Freq: Every day | ORAL | Status: DC
  Administered 2017-04-25: 10 mg via ORAL
  Filled 2017-04-25: qty 1

## 2017-04-25 MED ORDER — ACETAMINOPHEN 650 MG RE SUPP: 650.0000 mg | Freq: Four times a day (QID) | RECTAL | Status: DC | PRN

## 2017-04-25 NOTE — Progress Notes (Signed)
Pt ambulating to bathroom with walker, does well. Discharge instructions explained to pt and scripts given to her. Pt discharged via wheelchair. Husband here to take pt home.

## 2017-04-25 NOTE — Progress Notes (Signed)
Physical Therapy Treatment Patient Details Name: Sarah Stevenson MRN: [id number] DOB: [birth year] Today's Date: 04/25/2017    History of Present Illness s/p L THA    PT Comments    Pt will benefit from  [place]; pt is painful but moving well ; feels she can manage at home, has supportive friend that will be with her to assist as needed  Follow Up Recommendations  DC plan and follow up therapy as arranged by surgeon;Home health PT     Equipment Recommendations  Rolling walker with 5" wheels;3in1 (PT)    Recommendations for Other Services       Precautions / Restrictions Precautions Precautions: Fall Restrictions Weight Bearing Restrictions: No    Mobility  Bed Mobility               General bed mobility comments: in chair  Transfers Overall transfer level: Needs assistance Equipment used: Rolling walker (2 wheeled) Transfers: Sit to/from Stand Sit to Stand: Supervision         General transfer comment: cues for hand placement and LE position  Ambulation/Gait Ambulation/Gait assistance: Supervision Ambulation Distance (Feet): 60 Feet Assistive device: Rolling walker (2 wheeled) Gait Pattern/deviations: Step-to pattern;Step-through pattern     General Gait Details: cues for sequence, RW position/pushing RW rather than picking up   Stairs Stairs: Yes   Stair Management: With walker;Backwards;Step to pattern;No rails Number of Stairs: 2 General stair comments: cues for sequence and technique  Wheelchair Mobility    Modified Rankin (Stroke Patients Only)       Balance                                            Cognition Arousal/Alertness: Awake/alert Behavior During Therapy: WFL for tasks assessed/performed Overall Cognitive Status: Within Functional Limits for tasks assessed                                        Exercises Total Joint Exercises Ankle Circles/Pumps:  (reviewed HEP, pt too painful to  complete but will perform )    General Comments        Pertinent Vitals/Pain Pain Assessment: 0-10 Pain Score: 6  Pain Location: L hip  Pain Descriptors / Indicators: Sore;Aching Pain Intervention(s): Limited activity within patient's tolerance;Monitored during session;Premedicated before session;Repositioned;Ice applied    Home Living                      Prior Function            PT Goals (current goals can now be found in the care plan section) Acute Rehab PT Goals Patient Stated Goal: less pain PT Goal Formulation: With patient Time For Goal Achievement: 05/01/17 Potential to Achieve Goals: Good Progress towards PT goals: Progressing toward goals    Frequency    7X/week      PT Plan Current plan remains appropriate    Co-evaluation              AM-PAC PT "6 Clicks" Daily Activity  Outcome Measure  Difficulty turning over in bed (including adjusting bedclothes, sheets and blankets)?: A Little Difficulty moving from lying on back to sitting on the side of the bed? : A Little Difficulty sitting down on and standing up from a chair with  arms (e.g., wheelchair, bedside commode, etc,.)?: A Little Help needed moving to and from a bed to chair (including a wheelchair)?: A Little Help needed walking in hospital room?: A Little Help needed climbing 3-5 steps with a railing? : A Little 6 Click Score: 18    End of Session Equipment Utilized During Treatment: Gait belt Activity Tolerance: Patient tolerated treatment well Patient left: in chair;with call bell/phone within reach;with family/visitor present   PT Visit Diagnosis: Difficulty in walking, not elsewhere classified (R26.2)     Time: 1000-1032 PT Time Calculation (min) (ACUTE ONLY): 32 min  Charges:  $Gait Training: 23-37 mins                    G Codes:          Sarah Stevenson 2017/05/05, 11:17 AM

## 2017-04-25 NOTE — Progress Notes (Signed)
   Subjective: 2 Days Post-Op Procedure(s) (LRB): LEFT TOTAL HIP ARTHROPLASTY ANTERIOR APPROACH (Left) Patient reports pain as moderate.    Objective: Vital signs in last 24 hours: Temp:  [98.5 F (36.9 C)-101 F (38.3 C)] 101 F (38.3 C) (08/19 0617) Pulse Rate:  [83-99] 99 (08/19 0617) Resp:  [15-18] 15 (08/19 0617) BP: (101-128)/(40-65) 123/65 (08/19 0617) SpO2:  [90 %-99 %] 90 % (08/19 0617)  Intake/Output from previous day: 08/18 0701 - 08/19 0700 In: 2352.5 [P.O.:1140; I.V.:1212.5] Out: 900 [Urine:900] Intake/Output this shift: No intake/output data recorded.   Recent Labs  04/24/17 0514  HGB 10.1*    Recent Labs  04/24/17 0514  WBC 12.0*  RBC 3.27*  HCT 30.3*  PLT 175    Recent Labs  04/24/17 0514  NA 135  K 4.1  CL 103  CO2 24  BUN 18  CREATININE 1.23*  GLUCOSE 192*  CALCIUM 8.1*   No results for input(s): LABPT, INR in the last 72 hours.  Neurologically intact No results found.  Assessment/Plan: 2 Days Post-Op Procedure(s) (LRB): LEFT TOTAL HIP ARTHROPLASTY ANTERIOR APPROACH (Left) Up with therapy.  Possible home today depending on how she does with therapy. Prefers tylenol for pain , narcotic makes her sleepy. I suggested she try one oxy q 3 -4 instead of 2 tablets.   Marybelle Killings 04/25/2017, 8:23 AM

## 2017-04-26 ENCOUNTER — Other Ambulatory Visit: Payer: Self-pay | Admitting: *Deleted

## 2017-04-26 MED FILL — OXYCODONE-ACETAMINOPHEN 5-3: 5-325 | 5 days supply | Qty: 60 | Fill #0

## 2017-04-26 MED FILL — METHOCARBAMOL 500 MG TABLET: 500 | 15 days supply | Qty: 60 | Fill #0

## 2017-04-26 NOTE — Op Note (Signed)
NAME:  [name], [name]                   ACCOUNT NO.:  MEDICAL RECORD NO.:  [id number]  LOCATION:                                 FACILITY:  PHYSICIAN:  Lind Guest. [name], M.D.DATE OF BIRTH:  DATE OF PROCEDURE:  04/23/2017 DATE OF DISCHARGE:                              OPERATIVE REPORT   PREOPERATIVE DIAGNOSIS:  Primary osteoarthritis and degenerative joint disease of left hip.  POSTOPERATIVE DIAGNOSIS:  Primary osteoarthritis and degenerative joint disease of left hip.  PROCEDURE:  Left total hip arthroplasty through direct anterior approach.  IMPLANTS:  DePuy Sector Gription acetabular component size 50, size 32 +0 neutral polyethylene liner, size 10 Corail femoral component with varus offset, size 32 +1 ceramic hip ball.  SURGEON:  Lind Guest. [name], M.D.  ASSISTANT:  Erskine Emery, PA-C.  ANESTHESIA:  General.  BLOOD LOSS:  300-350 mL.  ANTIBIOTICS:  2 g of IV Ancef.  COMPLICATIONS:  None.  INDICATIONS:  Sarah Stevenson is a 61 year old female with debilitating arthritis of her left hip, this has been well documented.  Her x-ray showed complete loss of the joint space.  Her pain is daily and it is detrimentally affected her activities of daily living, her quality of life and her mobility.  At this point, she does wish to proceed with a total hip arthroplasty.  We have had a long and thorough discussion about this.  She understands the risk of acute blood loss anemia, nerve and vessel injury, fracture, infection, dislocation, DVT.  She understands our goals are to decrease pain, improve mobility and overall improved quality of life.  PROCEDURE DESCRIPTION:  After informed consent was obtained, appropriate left hip was marked.  She was brought to the operating room where general anesthesia was then obtained.  Traction boots were placed on both of her feet.  A Foley catheter was placed.  Next, she was placed supine on the Hana fracture table with the  perineal post in place and both legs in inline skeletal traction devices, but no traction applied. Her left operative hip was prepped and draped with DuraPrep and sterile drapes.  A time-out was called, she was identified as correct patient and correct left hip.  We then made an incision just inferior and posterior to the anterior superior iliac spine and carried this obliquely down the leg.  We dissected down the tensor fascia lata muscle.  The tensor fascia was then divided longitudinally to proceed with a direct anterior approach to the hip.  We identified and cauterized the circumflex vessels and then identified the hip capsule. I opened up the hip capsule in an L-type format, finding a large joint effusion and significant periarticular osteophytes.  We placed Cobra retractors around the medial and lateral femoral neck and then made our femoral neck cut with an oscillating saw and completed this with an osteotome.  I placed a corkscrew guide in the femoral head and removed the femoral head in its entirety and found it to be completely devoid of cartilage.  We then placed a bent Hohmann over the medial acetabular rim and released the transverse acetabular ligament, and removed osteophytes and labrum from around the acetabulum.  We  then began reaming under direct visualization from a size 42 reamer in stepwise increments up to a size 50 with all reamers under direct visualization, the last reamer under direct fluoroscopy, so we could obtain our depth of reaming, our inclination and anteversion.  Once I was pleased with this, I placed the real DePuy Sector Gription acetabular component size 50 and a 32 +0 neutral polyethylene liner for a size 50 acetabular component. Attention was then turned to the femur.  With the leg externally rotated to 120 degrees, extended and adducted, we were able to place a Mueller retractor medially and a Hohmann retractor behind the greater trochanter.  We  released the lateral joint capsule and used a box cutting osteotome to enter the femoral canal and a rongeur to lateralize.  We then began broaching from a size 8 broach using the Corail broaching system going up to a size 10.  With the size 10 in place, we trialed a varus offset femoral neck and a 32 +1 hip ball and reduced this in the acetabulum, and we were pleased with leg length, offset and range of motion as well as stability.  We then dislocated the hip and removed the trial components.  We were able to place the real Corail femoral component size 10 and the real 32+ 0 ceramic hip ball, reduced this in the acetabulum.  Again, we were pleased with stability. We then irrigated the soft tissue with normal saline solution using pulsatile lavage.  We closed the joint capsule with interrupted #1 Ethibond suture followed by running #1 Vicryl in the tensor fascia, 0 Vicryl in the deep tissue, 2-0 Vicryl in the subcutaneous tissue, interrupted staples on the skin.  Xeroform and Aquacel dressing were applied.  She was then taken off the Hana table, awakened, extubated and taken to the recovery room in stable condition.  All final counts were correct.  There were no complications noted.  Of note, Erskine Emery, PA- C, assisted in the entire case.  His assistance was crucial for facilitating all aspects of this case.     Lind Guest. [name], M.D.     CYB/MEDQ  D:  04/23/2017  T:  04/23/2017  Job:  [id number]

## 2017-04-26 NOTE — Patient Outreach (Signed)
[facility]) Care Management  04/26/2017  Sarah Stevenson [birth year] [id number]  Subjective: Telephone call to patient's home  number, no answer, left HIPAA compliant voicemail message, and requested call back.   Objective: Per chart review, patient hospitalized 04/23/17 - 04/25/17 for Primary osteoarthritis and degenerative joint disease of left hip.  Status post Left total hip arthroplasty through direct anterior approach on 04/23/17.  Patient also has a history of: CAD, hyperlipidemia, and diabetes.   Assessment: Received UMR Preoperative Follow up Call referral on 04/22/17.   Transition of care follow up pending patient contact.    Plan: RNCM will call patient for 2nd telephone outreach attempt, transition of care follow up, within 10 business days if no return call.   Shandell Giovanni H. Annia Friendly, BSN, [place] [facility] Telephonic CM Phone: [phone] Fax: [phone]

## 2017-04-27 DIAGNOSIS — I252 Old myocardial infarction: Secondary | ICD-10-CM | POA: Diagnosis not present

## 2017-04-27 DIAGNOSIS — Z96642 Presence of left artificial hip joint: Secondary | ICD-10-CM | POA: Diagnosis not present

## 2017-04-27 DIAGNOSIS — I350 Nonrheumatic aortic (valve) stenosis: Secondary | ICD-10-CM | POA: Diagnosis not present

## 2017-04-27 DIAGNOSIS — Z471 Aftercare following joint replacement surgery: Secondary | ICD-10-CM | POA: Diagnosis not present

## 2017-04-27 DIAGNOSIS — M5432 Sciatica, left side: Secondary | ICD-10-CM | POA: Diagnosis not present

## 2017-04-27 DIAGNOSIS — E785 Hyperlipidemia, unspecified: Secondary | ICD-10-CM | POA: Diagnosis not present

## 2017-04-27 DIAGNOSIS — I2510 Atherosclerotic heart disease of native coronary artery without angina pectoris: Secondary | ICD-10-CM | POA: Diagnosis not present

## 2017-04-27 DIAGNOSIS — F320 Major depressive disorder, single episode, mild: Secondary | ICD-10-CM | POA: Diagnosis not present

## 2017-04-27 DIAGNOSIS — E119 Type 2 diabetes mellitus without complications: Secondary | ICD-10-CM | POA: Diagnosis not present

## 2017-04-28 ENCOUNTER — Encounter: Payer: Self-pay | Admitting: *Deleted

## 2017-04-28 ENCOUNTER — Other Ambulatory Visit: Payer: Self-pay | Admitting: *Deleted

## 2017-04-28 NOTE — Patient Outreach (Signed)
Sarah Stevenson [facility]) Care Management  04/28/2017  Sarah Stevenson 13-Feb-1956 [id number]  Subjective: Telephone call to patient's home  number, spoke with patient, and HIPAA verified.  Discussed [facility] Care Management UMR Transition of care follow up, patient voiced understanding, and is in agreement to follow up.   Patient states she having more pain than anticipated from the surgery, just took some pain medicine, her pain is being managed by medications, does not like to take stronger pain medications, because the way it makes her feel.   States she is trying to be more patient and have more patience with her recovery.   Educated patient on pain management strategies, discussing how she feels with MD, patient voices understanding, and states she will follow up with MD.  States she has a follow up appointment with surgeon on 05/06/17.  Patient voices understanding of medical diagnosis, surgery, and treatment plan.  States she was recently diagnosed with Lyme Disease and has a MD follow up appointment in 6 weeks to discuss next steps.  States she is accessing the following [facility] benefits: outpatient pharmacy, hospital indemnity, short term disability pending, and family medical leave act [employer]) has been approved.  States she has had a problem with obtaining itemized hospital bill in the past for filing hospital indemnity claims and is planning to follow up with patient accounting to address for the future.   Patient states her diabetes is well controlled and is not currently eligible for [facility] program due to not having a compatible phone for the app.  States she may be interested in Link to Wellness program in the future, is aware of how to sign up if needed and does not want to pursue at this time.  Patient states she does not have any education material, transition of care, care coordination, disease management, disease monitoring, transportation, community resource, or pharmacy needs at this  time. States she is very appreciative of the follow up and is in agreement to receive [facility] information.  Telephone call to patient's home number, spoke with patient, and HIPAA verified.  Verified surgeon follow up appointment date.   Objective: Per chart review, patient hospitalized 04/23/17 - 04/25/17 for Primary osteoarthritis and degenerative joint disease of left hip.  Status post Left total hip arthroplasty through direct anterior approach on 04/23/17.  Patient also has a history of: CAD, hyperlipidemia, and diabetes.   Assessment: Received UMR Preoperative Follow up Call referral on 04/22/17.  Transition of care follow up completed, no care management needs, and will proceed with case closure.    Plan: RNCM will send patient successful outreach letter, [facility] pamphlet, and magnet. RNCM will send case closure due to follow up completed / no care management needs request to [facility] [place] [facility].    Sarah Stevenson H. Annia Friendly, BSN, [place] [facility] Telephonic CM Phone: [phone] Fax: [phone]

## 2017-04-29 DIAGNOSIS — F320 Major depressive disorder, single episode, mild: Secondary | ICD-10-CM | POA: Diagnosis not present

## 2017-04-29 DIAGNOSIS — I252 Old myocardial infarction: Secondary | ICD-10-CM | POA: Diagnosis not present

## 2017-04-29 DIAGNOSIS — E785 Hyperlipidemia, unspecified: Secondary | ICD-10-CM | POA: Diagnosis not present

## 2017-04-29 DIAGNOSIS — Z96642 Presence of left artificial hip joint: Secondary | ICD-10-CM | POA: Diagnosis not present

## 2017-04-29 DIAGNOSIS — M5432 Sciatica, left side: Secondary | ICD-10-CM | POA: Diagnosis not present

## 2017-04-29 DIAGNOSIS — I350 Nonrheumatic aortic (valve) stenosis: Secondary | ICD-10-CM | POA: Diagnosis not present

## 2017-04-29 DIAGNOSIS — Z471 Aftercare following joint replacement surgery: Secondary | ICD-10-CM | POA: Diagnosis not present

## 2017-04-29 DIAGNOSIS — I2510 Atherosclerotic heart disease of native coronary artery without angina pectoris: Secondary | ICD-10-CM | POA: Diagnosis not present

## 2017-04-29 DIAGNOSIS — E119 Type 2 diabetes mellitus without complications: Secondary | ICD-10-CM | POA: Diagnosis not present

## 2017-05-04 DIAGNOSIS — I350 Nonrheumatic aortic (valve) stenosis: Secondary | ICD-10-CM | POA: Diagnosis not present

## 2017-05-04 DIAGNOSIS — I252 Old myocardial infarction: Secondary | ICD-10-CM | POA: Diagnosis not present

## 2017-05-04 DIAGNOSIS — I2510 Atherosclerotic heart disease of native coronary artery without angina pectoris: Secondary | ICD-10-CM | POA: Diagnosis not present

## 2017-05-04 DIAGNOSIS — E785 Hyperlipidemia, unspecified: Secondary | ICD-10-CM | POA: Diagnosis not present

## 2017-05-04 DIAGNOSIS — Z471 Aftercare following joint replacement surgery: Secondary | ICD-10-CM | POA: Diagnosis not present

## 2017-05-04 DIAGNOSIS — M5432 Sciatica, left side: Secondary | ICD-10-CM | POA: Diagnosis not present

## 2017-05-04 DIAGNOSIS — Z96642 Presence of left artificial hip joint: Secondary | ICD-10-CM | POA: Diagnosis not present

## 2017-05-04 DIAGNOSIS — F320 Major depressive disorder, single episode, mild: Secondary | ICD-10-CM | POA: Diagnosis not present

## 2017-05-04 DIAGNOSIS — E119 Type 2 diabetes mellitus without complications: Secondary | ICD-10-CM | POA: Diagnosis not present

## 2017-05-06 ENCOUNTER — Ambulatory Visit (INDEPENDENT_AMBULATORY_CARE_PROVIDER_SITE_OTHER): Payer: 59 | Admitting: Orthopaedic Surgery

## 2017-05-06 DIAGNOSIS — I252 Old myocardial infarction: Secondary | ICD-10-CM | POA: Diagnosis not present

## 2017-05-06 DIAGNOSIS — Z96642 Presence of left artificial hip joint: Secondary | ICD-10-CM

## 2017-05-06 DIAGNOSIS — E785 Hyperlipidemia, unspecified: Secondary | ICD-10-CM | POA: Diagnosis not present

## 2017-05-06 DIAGNOSIS — I2510 Atherosclerotic heart disease of native coronary artery without angina pectoris: Secondary | ICD-10-CM | POA: Diagnosis not present

## 2017-05-06 DIAGNOSIS — E119 Type 2 diabetes mellitus without complications: Secondary | ICD-10-CM | POA: Diagnosis not present

## 2017-05-06 DIAGNOSIS — Z471 Aftercare following joint replacement surgery: Secondary | ICD-10-CM | POA: Diagnosis not present

## 2017-05-06 DIAGNOSIS — F320 Major depressive disorder, single episode, mild: Secondary | ICD-10-CM | POA: Diagnosis not present

## 2017-05-06 DIAGNOSIS — I350 Nonrheumatic aortic (valve) stenosis: Secondary | ICD-10-CM | POA: Diagnosis not present

## 2017-05-06 DIAGNOSIS — M5432 Sciatica, left side: Secondary | ICD-10-CM | POA: Diagnosis not present

## 2017-05-06 MED ORDER — SULFAMETHOXAZOLE-TRIMETHOPRIM 800-160 MG PO TABS: 1.0000 | ORAL_TABLET | Freq: Two times a day (BID) | ORAL | 0 refills | Status: DC

## 2017-05-06 MED FILL — SULFAMETHOXAZOLE/TMP DS TAB: 800-160 | 10 days supply | Qty: 20 | Fill #0

## 2017-05-06 NOTE — Progress Notes (Signed)
The patient be 2 weeks tomorrow status post a left total hip arthroplasty. She has been doing well. She's been reporting numbness in her opposite leg. She is a diabetic and said her blood sugars been unremarkable bit higher. She points the medial ankle and side of her leg as source of numbness patient denies any calf pain. She is on chronic blood thinning medication and she's back on regular dose of this already.  Her left hip incision is stable. I removed the staples and placed Steri-Strips. The skin is intact and is no redness. I felt that she had a large seroma but after trying to aspirate fluid getting up and this is more of a hematoma and her adipose tissue. I will put her on Bactrim double strength though since her blood sugars running her and she does have significant adipose tissue in this area.  Exam a walker and seems to be doing well. She is only on minimal pain medications.  We'll see her back in 4 to see how she doing overall unless there are any issues.

## 2017-05-07 NOTE — Discharge Summary (Signed)
Patient ID: Sarah Stevenson MRN: [id number] DOB/AGE: 1956/02/27 61 y.o.  Admit date: 04/23/2017 Discharge date: 05/07/2017  Admission Diagnoses:  Principal Problem:   Unilateral primary osteoarthritis, left hip Active Problems:   Status post total replacement of left hip   Discharge Diagnoses:  Same  Past Medical History:  Diagnosis Date  . Anginal pain ([place])   . Coronary artery disease   . Depression    "mild" (08/06/2016)  . Hyperlipidemia   . Murmur, cardiac   . Myocardial infarction ([place]) 2016  . Osteoarthritis    "left hip and knee" (08/06/2016)  . Type II diabetes mellitus ([place])     Surgeries: Procedure(s): LEFT TOTAL HIP ARTHROPLASTY ANTERIOR APPROACH on 04/23/2017   Consultants:   Discharged Condition: Improved  Hospital Course: Sarah Stevenson is an 61 y.o. female who was admitted 04/23/2017 for operative treatment ofUnilateral primary osteoarthritis, left hip. Patient has severe unremitting pain that affects sleep, daily activities, and work/hobbies. After pre-op clearance the patient was taken to the operating room on 04/23/2017 and underwent  Procedure(s): LEFT TOTAL HIP ARTHROPLASTY ANTERIOR APPROACH.    Patient was given perioperative antibiotics:  Anti-infectives    Start     Dose/Rate Route Frequency Ordered Stop   04/23/17 2000  ceFAZolin (ANCEF) IVPB 1 g/50 mL premix     1 g 100 mL/hr over 30 Minutes Intravenous Every 6 hours 04/23/17 1700 04/24/17 0300   04/23/17 1330  ceFAZolin (ANCEF) IVPB 2g/100 mL premix     2 g 200 mL/hr over 30 Minutes Intravenous On call to O.R. 04/23/17 1255 04/23/17 1425   04/23/17 1324  ceFAZolin (ANCEF) 2-4 GM/100ML-% IVPB    Comments:  Bridget Hartshorn   : cabinet override      04/23/17 1324 04/23/17 1410       Patient was given sequential compression devices, early ambulation, and chemoprophylaxis to prevent DVT.  Patient benefited maximally from hospital stay and there were no complications.    Recent vital  signs: No data found.    Recent laboratory studies: No results for input(s): WBC, HGB, HCT, PLT, NA, K, CL, CO2, BUN, CREATININE, GLUCOSE, INR, CALCIUM in the last 72 hours.  Invalid input(s): PT, 2   Discharge Medications:   Allergies as of 04/25/2017      Reactions   Asa [aspirin] Hives, Rash   Blue Dyes (parenteral) Hives   Corn-containing Products Hives   Hetastarch Hives, Rash   Isosulfan Blue Rash   Nsaids Hives, Rash   Sugar-protein-starch Hives   Tea Hives      Medication List    TAKE these medications   acetaminophen 325 MG tablet Commonly known as:  TYLENOL Take 650 mg by mouth every 6 (six) hours as needed for mild pain or headache.   carvedilol 3.125 MG tablet Commonly known as:  COREG TAKE 1 TABLET BY MOUTH TWICE DAILY WITH A MEAL What changed:  how much to take  how to take this  when to take this  additional instructions   celecoxib 200 MG capsule Commonly known as:  CELEBREX Take 200 mg by mouth daily.   cetirizine 10 MG tablet Commonly known as:  ZYRTEC Take 10 mg by mouth daily.   glipiZIDE 10 MG 24 hr tablet Commonly known as:  GLUCOTROL XL Take 10 mg by mouth daily with breakfast.   isosorbide mononitrate 30 MG 24 hr tablet Commonly known as:  IMDUR Take 30 mg by mouth every evening.   losartan 50 MG tablet Commonly known  as:  COZAAR Take 50 mg by mouth daily.   metFORMIN 750 MG 24 hr tablet Commonly known as:  GLUCOPHAGE-XR Take 1,500 mg by mouth every evening.   methocarbamol 500 MG tablet Commonly known as:  ROBAXIN Take 1 tablet (500 mg total) by mouth every 6 (six) hours as needed for muscle spasms. What changed:  when to take this   MUSCLE RUB EX Apply 1 application topically as needed (for pain).   nitroGLYCERIN 0.4 MG SL tablet Commonly known as:  NITROSTAT Place 1 tablet (0.4 mg total) under the tongue every 5 (five) minutes as needed for chest pain (Call 911 at 3rd dose within 15 minutes.).    oxyCODONE-acetaminophen 5-325 MG tablet Commonly known as:  ROXICET Take 1-2 tablets by mouth every 4 (four) hours as needed for severe pain. What changed:  how much to take  when to take this   prasugrel 10 MG Tabs tablet Commonly known as:  EFFIENT Take 1 tablet (10 mg total) by mouth daily.   rosuvastatin 40 MG tablet Commonly known as:  CRESTOR Take 1 tablet (40 mg total) by mouth daily. What changed:  when to take this   sertraline 50 MG tablet Commonly known as:  ZOLOFT Take 50 mg by mouth daily.   TRULICITY 0.62 IR/4.8NI Sopn Generic drug:  Dulaglutide Inject 0.75 mg into the skin every 7 (seven) days. sunday   Turmeric Curcumin Caps Take 2 capsules by mouth daily. 2400 mg total   ZANTAC 150 MG tablet Generic drug:  ranitidine Take 150 mg by mouth every evening.            Discharge Care Instructions        Start     Ordered   04/24/17 0000  methocarbamol (ROBAXIN) 500 MG tablet  Every 6 hours PRN     04/24/17 0725   04/24/17 0000  oxyCODONE-acetaminophen (ROXICET) 5-325 MG tablet  Every 4 hours PRN     08 /18/18 0725      Diagnostic Studies: Dg Pelvis Portable  Result Date: 04/23/2017 CLINICAL DATA:  Postoperative total hip replacement EXAM: PORTABLE PELVIS 1-2 VIEWS COMPARISON:  None. FINDINGS: Frontal view of the lower pelvis and hips bilaterally obtained. There is a total hip replacement on the left with prosthetic components well-seated on frontal view. No fracture or dislocation. There is moderate narrowing of the right hip joint. Soft tissue air on the left is an expected postoperative finding. IMPRESSION: Status post total hip replaced on the left with prosthetic components well-seated. Narrowing right hip joint. No acute fracture or dislocation. Electronically Signed   By: Lowella Grip III M.D.   On: 04/23/2017 16:12   Dg C-arm 1-60 Min-no Report  Result Date: 04/23/2017 CLINICAL DATA:  Left total hip replacement. EXAM: OPERATIVE RIGHT HIP  (WITH PELVIS IF PERFORMED) 1 VIEW TECHNIQUE: Fluoroscopic spot image(s) were submitted for interpretation post-operatively. COMPARISON:  Pelvic x-rays dated March 31, 2017. FLUOROSCOPY TIME:  0.4 minutes. C-arm fluoroscopic images were obtained intraoperatively and submitted for post operative interpretation. FINDINGS: Single intraoperative x-ray demonstrating interval left total hip arthroplasty. Components are well aligned. IMPRESSION: Intraoperative x-rays demonstrating interval left total hip arthroplasty. No definite complication. Electronically Signed   By: Titus Dubin M.D.   On: 04/23/2017 15:32   Dg Hip Operative Unilat W Or W/o Pelvis Left  Result Date: 04/23/2017 CLINICAL DATA:  Left total hip replacement. EXAM: OPERATIVE RIGHT HIP (WITH PELVIS IF PERFORMED) 1 VIEW TECHNIQUE: Fluoroscopic spot image(s) were submitted for interpretation post-operatively.  COMPARISON:  Pelvic x-rays dated March 31, 2017. FLUOROSCOPY TIME:  0.4 minutes. C-arm fluoroscopic images were obtained intraoperatively and submitted for post operative interpretation. FINDINGS: Single intraoperative x-ray demonstrating interval left total hip arthroplasty. Components are well aligned. IMPRESSION: Intraoperative x-rays demonstrating interval left total hip arthroplasty. No definite complication. Electronically Signed   By: Titus Dubin M.D.   On: 04/23/2017 15:32    Disposition: 01-Home or Self Care    Follow-up Information    Mcarthur Rossetti, MD Follow up in 2 week(s).   Specialty:  Orthopedic Surgery Contact information: [place] [address] [phone]            Signed: Erskine Emery 05/07/2017, 9:18 AM

## 2017-05-11 ENCOUNTER — Other Ambulatory Visit: Payer: 59 | Admitting: *Deleted

## 2017-05-11 DIAGNOSIS — F320 Major depressive disorder, single episode, mild: Secondary | ICD-10-CM | POA: Diagnosis not present

## 2017-05-11 DIAGNOSIS — E119 Type 2 diabetes mellitus without complications: Secondary | ICD-10-CM | POA: Diagnosis not present

## 2017-05-11 DIAGNOSIS — Z96642 Presence of left artificial hip joint: Secondary | ICD-10-CM | POA: Diagnosis not present

## 2017-05-11 DIAGNOSIS — M5432 Sciatica, left side: Secondary | ICD-10-CM | POA: Diagnosis not present

## 2017-05-11 DIAGNOSIS — I252 Old myocardial infarction: Secondary | ICD-10-CM | POA: Diagnosis not present

## 2017-05-11 DIAGNOSIS — Z471 Aftercare following joint replacement surgery: Secondary | ICD-10-CM | POA: Diagnosis not present

## 2017-05-11 DIAGNOSIS — E7849 Other hyperlipidemia: Secondary | ICD-10-CM

## 2017-05-11 DIAGNOSIS — E785 Hyperlipidemia, unspecified: Secondary | ICD-10-CM | POA: Diagnosis not present

## 2017-05-11 DIAGNOSIS — I2510 Atherosclerotic heart disease of native coronary artery without angina pectoris: Secondary | ICD-10-CM | POA: Diagnosis not present

## 2017-05-11 DIAGNOSIS — I350 Nonrheumatic aortic (valve) stenosis: Secondary | ICD-10-CM | POA: Diagnosis not present

## 2017-05-11 DIAGNOSIS — E784 Other hyperlipidemia: Secondary | ICD-10-CM | POA: Diagnosis not present

## 2017-05-11 LAB — HEPATIC FUNCTION PANEL
ALBUMIN: 4.1 g/dL (ref 3.6–4.8)
ALT: 21 IU/L (ref 0–32)
AST: 18 IU/L (ref 0–40)
Alkaline Phosphatase: 124 IU/L — ABNORMAL HIGH (ref 39–117)
BILIRUBIN, DIRECT: 0.16 mg/dL (ref 0.00–0.40)
Bilirubin Total: 0.5 mg/dL (ref 0.0–1.2)
TOTAL PROTEIN: 7 g/dL (ref 6.0–8.5)

## 2017-05-11 LAB — LIPID PANEL
CHOL/HDL RATIO: 5.3 ratio — AB (ref 0.0–4.4)
Cholesterol, Total: 153 mg/dL (ref 100–199)
HDL: 29 mg/dL — AB (ref >39)
LDL Calculated: 68 mg/dL (ref 0–99)
Triglycerides: 281 mg/dL — ABNORMAL HIGH (ref 0–149)
VLDL CHOLESTEROL CAL: 56 mg/dL — AB (ref 5–40)

## 2017-05-13 DIAGNOSIS — F320 Major depressive disorder, single episode, mild: Secondary | ICD-10-CM | POA: Diagnosis not present

## 2017-05-13 DIAGNOSIS — I2510 Atherosclerotic heart disease of native coronary artery without angina pectoris: Secondary | ICD-10-CM | POA: Diagnosis not present

## 2017-05-13 DIAGNOSIS — E119 Type 2 diabetes mellitus without complications: Secondary | ICD-10-CM | POA: Diagnosis not present

## 2017-05-13 DIAGNOSIS — Z96642 Presence of left artificial hip joint: Secondary | ICD-10-CM | POA: Diagnosis not present

## 2017-05-13 DIAGNOSIS — Z471 Aftercare following joint replacement surgery: Secondary | ICD-10-CM | POA: Diagnosis not present

## 2017-05-13 DIAGNOSIS — I252 Old myocardial infarction: Secondary | ICD-10-CM | POA: Diagnosis not present

## 2017-05-13 DIAGNOSIS — M5432 Sciatica, left side: Secondary | ICD-10-CM | POA: Diagnosis not present

## 2017-05-13 DIAGNOSIS — E785 Hyperlipidemia, unspecified: Secondary | ICD-10-CM | POA: Diagnosis not present

## 2017-05-13 DIAGNOSIS — I350 Nonrheumatic aortic (valve) stenosis: Secondary | ICD-10-CM | POA: Diagnosis not present

## 2017-05-17 DIAGNOSIS — F320 Major depressive disorder, single episode, mild: Secondary | ICD-10-CM | POA: Diagnosis not present

## 2017-05-17 DIAGNOSIS — E785 Hyperlipidemia, unspecified: Secondary | ICD-10-CM | POA: Diagnosis not present

## 2017-05-17 DIAGNOSIS — Z471 Aftercare following joint replacement surgery: Secondary | ICD-10-CM | POA: Diagnosis not present

## 2017-05-17 DIAGNOSIS — M5432 Sciatica, left side: Secondary | ICD-10-CM | POA: Diagnosis not present

## 2017-05-17 DIAGNOSIS — I2510 Atherosclerotic heart disease of native coronary artery without angina pectoris: Secondary | ICD-10-CM | POA: Diagnosis not present

## 2017-05-17 DIAGNOSIS — I252 Old myocardial infarction: Secondary | ICD-10-CM | POA: Diagnosis not present

## 2017-05-17 DIAGNOSIS — E119 Type 2 diabetes mellitus without complications: Secondary | ICD-10-CM | POA: Diagnosis not present

## 2017-05-17 DIAGNOSIS — Z96642 Presence of left artificial hip joint: Secondary | ICD-10-CM | POA: Diagnosis not present

## 2017-05-17 DIAGNOSIS — I350 Nonrheumatic aortic (valve) stenosis: Secondary | ICD-10-CM | POA: Diagnosis not present

## 2017-05-17 MED FILL — SERTRALINE HCL 50 MG TABLET: 50 | 90 days supply | Qty: 90 | Fill #2

## 2017-05-17 MED FILL — METFORMIN HCL ER 750 MG TAB: 750 | 90 days supply | Qty: 180 | Fill #2

## 2017-05-17 MED FILL — ISOSORBIDE MN ER 30 MG TAB: 30 | 90 days supply | Qty: 90 | Fill #3

## 2017-05-17 MED FILL — CELECOXIB 200 MG CAPSULE: 200 | 90 days supply | Qty: 90 | Fill #0

## 2017-05-17 MED FILL — CARVEDILOL 3.125 MG TABLET: 3.125 | 90 days supply | Qty: 180 | Fill #1

## 2017-05-17 MED FILL — PRASUGREL 10 MG TABLET: 10 | 30 days supply | Qty: 30 | Fill #9

## 2017-05-19 DIAGNOSIS — Z96642 Presence of left artificial hip joint: Secondary | ICD-10-CM | POA: Diagnosis not present

## 2017-05-19 DIAGNOSIS — I252 Old myocardial infarction: Secondary | ICD-10-CM | POA: Diagnosis not present

## 2017-05-19 DIAGNOSIS — I350 Nonrheumatic aortic (valve) stenosis: Secondary | ICD-10-CM | POA: Diagnosis not present

## 2017-05-19 DIAGNOSIS — F320 Major depressive disorder, single episode, mild: Secondary | ICD-10-CM | POA: Diagnosis not present

## 2017-05-19 DIAGNOSIS — E785 Hyperlipidemia, unspecified: Secondary | ICD-10-CM | POA: Diagnosis not present

## 2017-05-19 DIAGNOSIS — I2510 Atherosclerotic heart disease of native coronary artery without angina pectoris: Secondary | ICD-10-CM | POA: Diagnosis not present

## 2017-05-19 DIAGNOSIS — Z471 Aftercare following joint replacement surgery: Secondary | ICD-10-CM | POA: Diagnosis not present

## 2017-05-19 DIAGNOSIS — M5432 Sciatica, left side: Secondary | ICD-10-CM | POA: Diagnosis not present

## 2017-05-19 DIAGNOSIS — E119 Type 2 diabetes mellitus without complications: Secondary | ICD-10-CM | POA: Diagnosis not present

## 2017-05-26 MED FILL — TRULICITY 0.75 MG/0.5 ML PE: 0.75 | 84 days supply | Qty: 6 | Fill #0

## 2017-06-01 ENCOUNTER — Other Ambulatory Visit (INDEPENDENT_AMBULATORY_CARE_PROVIDER_SITE_OTHER): Payer: Self-pay | Admitting: Orthopaedic Surgery

## 2017-06-01 DIAGNOSIS — M2550 Pain in unspecified joint: Secondary | ICD-10-CM | POA: Diagnosis not present

## 2017-06-01 DIAGNOSIS — I2510 Atherosclerotic heart disease of native coronary artery without angina pectoris: Secondary | ICD-10-CM | POA: Diagnosis not present

## 2017-06-01 DIAGNOSIS — R768 Other specified abnormal immunological findings in serum: Secondary | ICD-10-CM | POA: Diagnosis not present

## 2017-06-01 DIAGNOSIS — E669 Obesity, unspecified: Secondary | ICD-10-CM | POA: Diagnosis not present

## 2017-06-01 DIAGNOSIS — Z6837 Body mass index (BMI) 37.0-37.9, adult: Secondary | ICD-10-CM | POA: Diagnosis not present

## 2017-06-01 DIAGNOSIS — I350 Nonrheumatic aortic (valve) stenosis: Secondary | ICD-10-CM | POA: Diagnosis not present

## 2017-06-01 DIAGNOSIS — Z8261 Family history of arthritis: Secondary | ICD-10-CM | POA: Diagnosis not present

## 2017-06-03 ENCOUNTER — Ambulatory Visit (INDEPENDENT_AMBULATORY_CARE_PROVIDER_SITE_OTHER): Payer: 59 | Admitting: Orthopaedic Surgery

## 2017-06-03 DIAGNOSIS — Z96642 Presence of left artificial hip joint: Secondary | ICD-10-CM

## 2017-06-03 NOTE — Progress Notes (Signed)
The patient is now about 6-7 weeks status post a left total hip arthroplasty direct injury approach. She still has some numbness around her incision to be expected. She still has some start up pain but is now not using a cane. She is continuing to improve on a daily basis and getting around better than what she said she did before surgery.  On exam she has fluid range of motion of her left hip without difficulty. Her leg lengths are equal as well. She does have swelling around her hip incision but this is appropriate postoperative swelling. There is no redness. She has some subjective decreased sensation in her proximal thigh is also appropriate postoperative. She understands that this will slowly resolve with time it can take 6 months to a year.  At this point she'll continue increase her activities. I will release her to return to full work duties starting 07/08/2017. I'll see her back in 6 months to see how she is doing overall. We will obtain a low AP pelvis and lateral of her left hip at that visit.

## 2017-06-18 MED FILL — ROSUVASTATIN CALCIUM 40 MG: 40 | 90 days supply | Qty: 90 | Fill #1

## 2017-06-18 MED FILL — PRASUGREL 10 MG TABLET: 10 | 30 days supply | Qty: 30 | Fill #10

## 2017-06-24 DIAGNOSIS — H2513 Age-related nuclear cataract, bilateral: Secondary | ICD-10-CM | POA: Diagnosis not present

## 2017-06-24 DIAGNOSIS — H5213 Myopia, bilateral: Secondary | ICD-10-CM | POA: Diagnosis not present

## 2017-06-24 DIAGNOSIS — H524 Presbyopia: Secondary | ICD-10-CM | POA: Diagnosis not present

## 2017-06-24 DIAGNOSIS — Z7984 Long term (current) use of oral hypoglycemic drugs: Secondary | ICD-10-CM | POA: Diagnosis not present

## 2017-06-24 DIAGNOSIS — E119 Type 2 diabetes mellitus without complications: Secondary | ICD-10-CM | POA: Diagnosis not present

## 2017-06-24 DIAGNOSIS — H52223 Regular astigmatism, bilateral: Secondary | ICD-10-CM | POA: Diagnosis not present

## 2017-07-14 MED FILL — PRASUGREL 10 MG TABLET: 10 | 30 days supply | Qty: 30 | Fill #11

## 2017-07-14 MED FILL — glipiZIDE ER 10 MG TB24: 10 | 90 days supply | Qty: 90 | Fill #3

## 2017-07-14 MED FILL — LOSARTAN POTASSIUM 50 MG TA: 50 | 90 days supply | Qty: 90 | Fill #0

## 2017-08-04 ENCOUNTER — Other Ambulatory Visit: Payer: Self-pay | Admitting: Interventional Cardiology

## 2017-08-04 MED FILL — CELECOXIB 200 MG CAPSULE: 200 | 90 days supply | Qty: 90 | Fill #1

## 2017-08-04 MED FILL — SERTRALINE HCL 50 MG TABLET: 50 | 90 days supply | Qty: 90 | Fill #3

## 2017-08-04 MED FILL — CARVEDILOL 3.125 MG TABLET: 3.125 | 90 days supply | Qty: 180 | Fill #2

## 2017-08-04 MED FILL — TRULICITY 0.75 MG/0.5 ML PE: 0.75 | 84 days supply | Qty: 6 | Fill #1

## 2017-08-04 MED FILL — METFORMIN HCL ER 750 MG TAB: 750 | 90 days supply | Qty: 180 | Fill #3

## 2017-08-05 MED FILL — ISOSORBIDE MN ER 30 MG TAB: 30 | 90 days supply | Qty: 90 | Fill #0

## 2017-08-27 MED FILL — PRASUGREL 10 MG TABLET: 10 | 30 days supply | Qty: 30 | Fill #0

## 2017-09-20 DIAGNOSIS — I10 Essential (primary) hypertension: Secondary | ICD-10-CM | POA: Diagnosis not present

## 2017-09-20 DIAGNOSIS — E782 Mixed hyperlipidemia: Secondary | ICD-10-CM | POA: Diagnosis not present

## 2017-09-20 DIAGNOSIS — Z794 Long term (current) use of insulin: Secondary | ICD-10-CM | POA: Diagnosis not present

## 2017-09-20 DIAGNOSIS — E118 Type 2 diabetes mellitus with unspecified complications: Secondary | ICD-10-CM | POA: Diagnosis not present

## 2017-09-21 ENCOUNTER — Other Ambulatory Visit: Payer: Self-pay | Admitting: *Deleted

## 2017-09-21 MED ORDER — PRASUGREL HCL 10 MG PO TABS: 10.0000 mg | ORAL_TABLET | Freq: Every day | ORAL | 1 refills | Status: DC

## 2017-09-21 MED FILL — ROSUVASTATIN CALCIUM 40 MG: 40 | 90 days supply | Qty: 90 | Fill #2

## 2017-09-21 MED FILL — PRASUGREL 10 MG TABLET: 10 | 90 days supply | Qty: 90 | Fill #0

## 2017-09-22 DIAGNOSIS — E782 Mixed hyperlipidemia: Secondary | ICD-10-CM | POA: Diagnosis not present

## 2017-09-22 DIAGNOSIS — I2510 Atherosclerotic heart disease of native coronary artery without angina pectoris: Secondary | ICD-10-CM | POA: Diagnosis not present

## 2017-09-22 DIAGNOSIS — I10 Essential (primary) hypertension: Secondary | ICD-10-CM | POA: Diagnosis not present

## 2017-09-22 DIAGNOSIS — E119 Type 2 diabetes mellitus without complications: Secondary | ICD-10-CM | POA: Diagnosis not present

## 2017-09-22 MED FILL — TRULICITY 1.5 MG/0.5 ML PEN: 1.5 | 84 days supply | Qty: 6 | Fill #0

## 2017-10-18 MED FILL — LOSARTAN POTASSIUM 50 MG TA: 50 | 90 days supply | Qty: 90 | Fill #1

## 2017-10-19 MED FILL — glipiZIDE ER 10 MG TB24: 10 | 90 days supply | Qty: 90 | Fill #0

## 2017-11-29 ENCOUNTER — Other Ambulatory Visit: Payer: Self-pay | Admitting: Interventional Cardiology

## 2017-11-29 MED FILL — CELECOXIB 200 MG CAP: 200 | 90 days supply | Qty: 90 | Fill #0

## 2017-11-29 MED FILL — ISOSORBIDE MN ER 30 MG TAB: 30 | 90 days supply | Qty: 90 | Fill #1

## 2017-11-29 MED FILL — SERTRALINE HCL 50 MG TABLET: 50 | 90 days supply | Qty: 90 | Fill #0

## 2017-12-01 ENCOUNTER — Ambulatory Visit (INDEPENDENT_AMBULATORY_CARE_PROVIDER_SITE_OTHER): Payer: 59 | Admitting: Orthopaedic Surgery

## 2017-12-01 ENCOUNTER — Encounter (INDEPENDENT_AMBULATORY_CARE_PROVIDER_SITE_OTHER): Payer: Self-pay | Admitting: Orthopaedic Surgery

## 2017-12-01 ENCOUNTER — Ambulatory Visit (INDEPENDENT_AMBULATORY_CARE_PROVIDER_SITE_OTHER): Payer: 59

## 2017-12-01 DIAGNOSIS — Z96642 Presence of left artificial hip joint: Secondary | ICD-10-CM

## 2017-12-01 DIAGNOSIS — M1611 Unilateral primary osteoarthritis, right hip: Secondary | ICD-10-CM | POA: Diagnosis not present

## 2017-12-01 DIAGNOSIS — M7061 Trochanteric bursitis, right hip: Secondary | ICD-10-CM | POA: Diagnosis not present

## 2017-12-01 MED FILL — CARVEDILOL 3.125 MG TABLET: 3.125 | 90 days supply | Qty: 180 | Fill #0

## 2017-12-01 NOTE — Progress Notes (Signed)
Office Visit Note   Patient: Sarah Stevenson           Date of Birth: 1956/04/18           MRN: [id number] Visit Date: 12/01/2017              Requested by: Christain Sacramento, MD [place], [place] [address] PCP: Christain Sacramento, MD   Assessment & Plan: Visit Diagnoses:  1. Status post total replacement of left hip   2. Trochanteric bursitis, right hip   3. Unilateral primary osteoarthritis, right hip     Plan: Her left hip seems to be doing quite well.  The x-rays show well-seated implant with no complicating features.  She is developing arthritic changes on the right side.  I showed her stretching exercises for trochanteric area and if he gets to the point where her right hip is bothering her enough she will let us know.  Right now she does not want any other intervention.  We will see her back in 6 months and I would like just a low AP pelvis at that visit.  Follow-Up Instructions: Return in about 6 months (around 06/03/2018).   Orders:  Orders Placed This Encounter  Procedures  . XR HIP UNILAT W OR W/O PELVIS 2-3 VIEWS LEFT   No orders of the defined types were placed in this encounter.     Procedures: No procedures performed   Clinical Data: No additional findings.   Subjective: Chief Complaint  Patient presents with  . Left Hip - Pain  The patient is now 7 months status post a left total hip arthroplasty through direct anterior approach views of the left hip is doing well.  She is been having some right hip pain that starts in the sciatic area and wraps around her hip is mainly on the side of the hip down the side of the leg as well as does not go to her foot and there is no numbness and tingling.  She is also had some popping in her neck.  As far as her left hip goes though she said there is still some numbness and some thigh pain but overall she is made a lot of improvements and is doing better overall.  HPI  Review of Systems She currently  denies any headache, chest pain, shortness of breath, fever, chills, nausea, vomiting.  Objective: Vital Signs: There were no vitals taken for this visit.  Physical Exam She is alert and oriented x3 and in no acute distress Ortho Exam Examination of her cervical spine and neck shows full range of motion of the neck.  There is slight popping this is mainly related to posterior elements and some mild arthritic changes.  She has negative Spurling sign bilaterally.  Examination of her left hip shows fluid range of motion with no difficulty at all.  Examination of her right hip shows full range of motion.  She does have pain over trochanteric area to deep palpation and IT band and deep elevation of the right side. Specialty Comments:  No specialty comments available.  Imaging: Xr Hip Unilat W Or W/o Pelvis 2-3 Views Left  Result Date: 12/01/2017 An AP pelvis and lateral of the left hip shows a well-seated total hip arthroplasty on the left side.  There is arthritic changes in the right hip with joint space narrowing at the superior lateral aspect.    PMFS History: Patient Active Problem List   Diagnosis Date Noted  .  Trochanteric bursitis, right hip 12/01/2017  . Unilateral primary osteoarthritis, right hip 12/01/2017  . Status post total replacement of left hip 04/23/2017  . Pain in left hip 03/31/2017  . Unilateral primary osteoarthritis, left hip 03/31/2017  . Sciatica, left side 03/31/2017  . Aortic stenosis, moderate   . Positive cardiac stress test   . Trochanteric bursitis of left hip 08/14/2015  . Arthritis of left hip 08/14/2015  . Excessive daytime sleepiness 03/03/2015  . Snoring 12/20/2014  . CAD (coronary artery disease), native coronary artery 12/19/2014  . Type 2 diabetes mellitus with complication ([place])   . Hyperlipidemia   . Murmur, cardiac    Past Medical History:  Diagnosis Date  . Anginal pain ([place])   . Coronary artery disease   . Depression    "mild"  (08/06/2016)  . Hyperlipidemia   . Murmur, cardiac   . Myocardial infarction ([place]) 2016  . Osteoarthritis    "left hip and knee" (08/06/2016)  . Type II diabetes mellitus ([facility])     Family History  Problem Relation Age of Onset  . Heart attack Mother   . Heart attack Father   . Sudden death Father   . Hypertension Neg Hx   . Hyperlipidemia Neg Hx   . Diabetes Neg Hx     Past Surgical History:  Procedure Laterality Date  . CARDIAC CATHETERIZATION Right 11/30/2014   Procedure: RIGHT/LEFT HEART CATH AND CORONARY ANGIOGRAPHY;  Surgeon: Belva Crome, MD;  Location: [facility] CATH LAB;  Service: Cardiovascular;  Laterality: Right;  . CARDIAC CATHETERIZATION N/A 12/13/2015   Procedure: Left Heart Cath and Coronary Angiography;  Surgeon: Leonie Man, MD;  Location: [facility] CV LAB;  Service: Cardiovascular;  Laterality: N/A;  . CARDIAC CATHETERIZATION N/A 12/13/2015   Procedure: Intravascular Pressure Wire/FFR Study;  Surgeon: Leonie Man, MD;  Location: [facility] CV LAB;  Service: Cardiovascular;  Laterality: N/A;  RCA  . CARDIAC CATHETERIZATION N/A 08/06/2016   Procedure: Right/Left Heart Cath and Coronary Angiography;  Surgeon: Lorretta Harp, MD;  Location: [facility] CV LAB;  Service: Cardiovascular;  Laterality: N/A;  . CARDIAC CATHETERIZATION N/A 08/06/2016   Procedure: Coronary Stent Intervention;  Surgeon: Lorretta Harp, MD;  Location: [facility] CV LAB;  Service: Cardiovascular;  Laterality: N/A;   distal rca 3.0x16 and 3.0x8 synergy  . CESAREAN SECTION  1989  . CORONARY ANGIOPLASTY WITH STENT PLACEMENT  08/06/2016   "2 stents"  . HYSTEROSCOPY DIAGNOSTIC  1990s  . JOINT REPLACEMENT    . KNEE ARTHROSCOPY Left 1996  . NASAL SINUS SURGERY  1977  . TOTAL HIP ARTHROPLASTY Left 04/23/2017   Procedure: LEFT TOTAL HIP ARTHROPLASTY ANTERIOR APPROACH;  Surgeon: Mcarthur Rossetti, MD;  Location: [facility] ORS;  Service: Orthopedics;  Laterality: Left;   Social History    Occupational History  . Not on file  Tobacco Use  . Smoking status: Former Smoker    Packs/day: 1.00    Years: 10.00    Pack years: 10.00    Types: Cigarettes    Last attempt to quit: 1982    Years since quitting: 37.2  . Smokeless tobacco: Never Used  Substance and Sexual Activity  . Alcohol use: Yes    Alcohol/week: 4.2 oz    Types: 7 Shots of liquor per week  . Drug use: No  . Sexual activity: Never

## 2017-12-08 MED FILL — METFORMIN HCL ER 750 MG TAB: 750 | 90 days supply | Qty: 180 | Fill #0

## 2017-12-28 MED FILL — PRASUGREL HCL 10 MG TABS: 10 | 90 days supply | Qty: 90 | Fill #1

## 2017-12-28 MED FILL — ROSUVASTATIN CALCIUM 40 MG: 40 | 90 days supply | Qty: 90 | Fill #3

## 2018-01-04 DIAGNOSIS — L2389 Allergic contact dermatitis due to other agents: Secondary | ICD-10-CM | POA: Diagnosis not present

## 2018-01-04 MED FILL — predniSONE 10 MG TABS: 10 | 6 days supply | Qty: 7 | Fill #0

## 2018-01-04 MED FILL — TRIAMCINOLONE 0.1% CREAM: 0.1 | 15 days supply | Qty: 15 | Fill #0

## 2018-01-19 MED FILL — glipiZIDE ER 10 MG TB24: 10 | 90 days supply | Qty: 90 | Fill #1

## 2018-01-19 MED FILL — LOSARTAN POTASSIUM 50 MG TA: 50 | 90 days supply | Qty: 90 | Fill #0

## 2018-01-26 ENCOUNTER — Other Ambulatory Visit (HOSPITAL_BASED_OUTPATIENT_CLINIC_OR_DEPARTMENT_OTHER): Payer: Self-pay | Admitting: Family Medicine

## 2018-01-26 DIAGNOSIS — Z1231 Encounter for screening mammogram for malignant neoplasm of breast: Secondary | ICD-10-CM

## 2018-02-04 MED FILL — AMOXICILLIN 500 MG CAPSULE: 500 | 4 days supply | Qty: 16 | Fill #0

## 2018-02-07 ENCOUNTER — Ambulatory Visit (HOSPITAL_BASED_OUTPATIENT_CLINIC_OR_DEPARTMENT_OTHER)
Admission: RE | Admit: 2018-02-07 | Discharge: 2018-02-07 | Disposition: A | Discharge status: Home / Self Care | Payer: 59 | Source: Ambulatory Visit | Attending: Family Medicine | Admitting: Family Medicine

## 2018-02-07 DIAGNOSIS — Z1231 Encounter for screening mammogram for malignant neoplasm of breast: Secondary | ICD-10-CM | POA: Diagnosis not present

## 2018-03-01 ENCOUNTER — Other Ambulatory Visit: Payer: Self-pay | Admitting: Interventional Cardiology

## 2018-03-01 MED FILL — CARVEDILOL 3.125 MG TABLET: 3.125 | 90 days supply | Qty: 180 | Fill #0

## 2018-03-01 MED FILL — CELECOXIB 200 MG CAP: 200 | 90 days supply | Qty: 90 | Fill #1

## 2018-03-01 MED FILL — METFORMIN HCL ER 750 MG TAB: 750 | 90 days supply | Qty: 180 | Fill #1

## 2018-03-01 MED FILL — ISOSORBIDE MN ER 30 MG TAB: 30 | 90 days supply | Qty: 90 | Fill #2

## 2018-03-14 ENCOUNTER — Encounter: Payer: Self-pay | Admitting: Nurse Practitioner

## 2018-03-16 ENCOUNTER — Ambulatory Visit (HOSPITAL_COMMUNITY): Payer: 59 | Attending: Cardiology

## 2018-03-16 ENCOUNTER — Other Ambulatory Visit: Payer: Self-pay

## 2018-03-16 DIAGNOSIS — E785 Hyperlipidemia, unspecified: Secondary | ICD-10-CM | POA: Diagnosis not present

## 2018-03-16 DIAGNOSIS — R0600 Dyspnea, unspecified: Secondary | ICD-10-CM | POA: Diagnosis not present

## 2018-03-16 DIAGNOSIS — Z87891 Personal history of nicotine dependence: Secondary | ICD-10-CM | POA: Insufficient documentation

## 2018-03-16 DIAGNOSIS — I509 Heart failure, unspecified: Secondary | ICD-10-CM | POA: Insufficient documentation

## 2018-03-16 DIAGNOSIS — I350 Nonrheumatic aortic (valve) stenosis: Secondary | ICD-10-CM | POA: Diagnosis not present

## 2018-03-16 DIAGNOSIS — E119 Type 2 diabetes mellitus without complications: Secondary | ICD-10-CM | POA: Insufficient documentation

## 2018-03-16 DIAGNOSIS — I083 Combined rheumatic disorders of mitral, aortic and tricuspid valves: Secondary | ICD-10-CM | POA: Diagnosis not present

## 2018-03-29 ENCOUNTER — Ambulatory Visit (INDEPENDENT_AMBULATORY_CARE_PROVIDER_SITE_OTHER): Payer: 59 | Admitting: Nurse Practitioner

## 2018-03-29 ENCOUNTER — Encounter: Payer: Self-pay | Admitting: Nurse Practitioner

## 2018-03-29 ENCOUNTER — Other Ambulatory Visit: Payer: Self-pay | Admitting: Nurse Practitioner

## 2018-03-29 VITALS — BP 120/68 | HR 94 | Ht 62.5 in | Wt 212.1 lb

## 2018-03-29 DIAGNOSIS — R0609 Other forms of dyspnea: Secondary | ICD-10-CM | POA: Diagnosis not present

## 2018-03-29 DIAGNOSIS — I350 Nonrheumatic aortic (valve) stenosis: Secondary | ICD-10-CM

## 2018-03-29 DIAGNOSIS — R0600 Dyspnea, unspecified: Secondary | ICD-10-CM

## 2018-03-29 MED ORDER — PRASUGREL HCL 10 MG PO TABS: 10.0000 mg | ORAL_TABLET | Freq: Every day | ORAL | 2 refills | Status: DC

## 2018-03-29 MED FILL — PRASUGREL HCL 10 MG TABS: 10 | 90 days supply | Qty: 90 | Fill #0

## 2018-03-29 NOTE — Progress Notes (Signed)
CARDIOLOGY OFFICE NOTE  Date:  03/29/2018    Sarah Stevenson Date of Birth: 09-03-1956 Medical Record #[id number]  PCP:  Christain Sacramento, MD  Cardiologist:  Jennings Books    Chief Complaint  Patient presents with  . Aortic Stenosis    1 year check - seen for Dr. Tamala Julian    History of Present Illness: Sarah Stevenson is a 62 y.o. female who presents today for a one year check. Seen for Dr. Tamala Julian.   She has a history of CAD with prior RCA DES in March of 2017 and to the mid RCA in November 2017 in the setting of angina, AS, DM, HLD, and OA. She has a bicuspid aortic valve. She has residual LAD disease from last cath in November of 2017.   She was last seen a year ago - had stable DOE. More limited by left hip pain from her arthritis.   Comes in today. Here with her friend Fritz Pickerel.  She is not doing well. Her shortness of breath has progressed. She has noted more elevated HR with activity. She is doing chelation thru a study at [facility]. She is having more chest burning with walking. She is now working at [employer] on [address]. She has a considerable way to walk from the parking lot to the unit. She will have to stop due to the incline on the way back to her car. She has used several NTG several times over the last month. Her chest burning and shortness of breath is progressive over the past month. She is having more myalgias - she has cut her statin back to three times a week.   SHE IS ASPIRIN ALLERGIC  Past Medical History:  Diagnosis Date  . Anginal pain ([place])   . Coronary artery disease   . Depression    "mild" (08/06/2016)  . Hyperlipidemia   . Murmur, cardiac   . Myocardial infarction ([place]) 2016  . Osteoarthritis    "left hip and knee" (08/06/2016)  . Type II diabetes mellitus ([place])     Past Surgical History:  Procedure Laterality Date  . CARDIAC CATHETERIZATION Right 11/30/2014   Procedure: RIGHT/LEFT HEART CATH AND CORONARY ANGIOGRAPHY;  Surgeon: Belva Crome, MD;   Location: [facility] CATH LAB;  Service: Cardiovascular;  Laterality: Right;  . CARDIAC CATHETERIZATION N/A 12/13/2015   Procedure: Left Heart Cath and Coronary Angiography;  Surgeon: Leonie Man, MD;  Location: [facility] CV LAB;  Service: Cardiovascular;  Laterality: N/A;  . CARDIAC CATHETERIZATION N/A 12/13/2015   Procedure: Intravascular Pressure Wire/FFR Study;  Surgeon: Leonie Man, MD;  Location: [facility] CV LAB;  Service: Cardiovascular;  Laterality: N/A;  RCA  . CARDIAC CATHETERIZATION N/A 08/06/2016   Procedure: Right/Left Heart Cath and Coronary Angiography;  Surgeon: Lorretta Harp, MD;  Location: [facility] CV LAB;  Service: Cardiovascular;  Laterality: N/A;  . CARDIAC CATHETERIZATION N/A 08/06/2016   Procedure: Coronary Stent Intervention;  Surgeon: Lorretta Harp, MD;  Location: [facility] CV LAB;  Service: Cardiovascular;  Laterality: N/A;   distal rca 3.0x16 and 3.0x8 synergy  . CESAREAN SECTION  1989  . CORONARY ANGIOPLASTY WITH STENT PLACEMENT  08/06/2016   "2 stents"  . HYSTEROSCOPY DIAGNOSTIC  1990s  . JOINT REPLACEMENT    . KNEE ARTHROSCOPY Left 1996  . NASAL SINUS SURGERY  1977  . TOTAL HIP ARTHROPLASTY Left 04/23/2017   Procedure: LEFT TOTAL HIP ARTHROPLASTY ANTERIOR APPROACH;  Surgeon: [name],  Lind Guest, MD;  Location: [facility] ORS;  Service: Orthopedics;  Laterality: Left;     Medications: Current Meds  Medication Sig  . acetaminophen (TYLENOL) 325 MG tablet Take 650 mg by mouth every 6 (six) hours as needed for mild pain or headache.  . carvedilol (COREG) 3.125 MG tablet TAKE 1 TABLET BY MOUTH TWICE DAILY WITH A MEAL. Please keep upcoming appt in July for future refills. Thank you  . celecoxib (CELEBREX) 200 MG capsule Take 200 mg by mouth daily.  . cetirizine (ZYRTEC) 10 MG tablet Take 10 mg by mouth daily.  . Dulaglutide (TRULICITY [place]) Inject 4.58 mg into the skin every 7 (seven) days. sunday  . glipiZIDE (GLUCOTROL XL) 10 MG 24 hr tablet Take 10 mg by  mouth daily with breakfast.   . isosorbide mononitrate (IMDUR) 30 MG 24 hr tablet TAKE 1 TABLET (30 MG TOTAL) BY MOUTH DAILY.  Marland Kitchen losartan (COZAAR) 50 MG tablet Take 50 mg by mouth daily.   . Menthol-Methyl Salicylate (MUSCLE RUB EX) Apply 1 application topically as needed (for pain).  . metFORMIN (GLUCOPHAGE-XR) 750 MG 24 hr tablet Take 1,500 mg by mouth every evening.  . methocarbamol (ROBAXIN) 500 MG tablet Take 1 tablet (500 mg total) by mouth every 6 (six) hours as needed for muscle spasms.  . Misc Natural Products (TURMERIC CURCUMIN) CAPS Take 2 capsules by mouth daily. 2400 mg total  . Multiple Vitamin (MULTIVITAMIN WITH MINERALS) TABS tablet Take 1 tablet by mouth daily.  . nitroGLYCERIN (NITROSTAT) 0.4 MG SL tablet Place 1 tablet (0.4 mg total) under the tongue every 5 (five) minutes as needed for chest pain (Call 911 at 3rd dose within 15 minutes.).  Marland Kitchen oxyCODONE-acetaminophen (ROXICET) 5-325 MG tablet Take 1-2 tablets by mouth every 4 (four) hours as needed for severe pain.  . prasugrel (EFFIENT) 10 MG TABS tablet Take 1 tablet (10 mg total) by mouth daily.  . ranitidine (ZANTAC) 150 MG tablet Take 150 mg by mouth every evening.   . rosuvastatin (CRESTOR) 40 MG tablet Take 20 mg by mouth as directed. Takes by mouth one half tablet (20 mg ) three times a week.  . sertraline (ZOLOFT) 50 MG tablet Take 50 mg by mouth daily.     Allergies: Allergies  Allergen Reactions  . Asa [Aspirin] Hives and Rash  . Blue Dyes (Parenteral) Hives  . Corn-Containing Products Hives  . Hetastarch Hives and Rash  . Isosulfan Blue Rash  . Nsaids Hives and Rash  . Sugar-Protein-Starch Hives  . Tea Hives    Social History: The patient  reports that she quit smoking about 37 years ago. Her smoking use included cigarettes. She has a 10.00 pack-year smoking history. She has never used smokeless tobacco. She reports that she drinks about 4.2 oz of alcohol per week. She reports that she does not use  drugs.   Family History: The patient's family history includes Heart attack in her father and mother; Sudden death in her father.   Review of Systems: Please see the history of present illness.   Otherwise, the review of systems is positive for none.   All other systems are reviewed and negative.   Physical Exam: VS:  BP 120/68 (BP Location: Left Arm, Patient Position: Sitting, Cuff Size: Normal)   Pulse 94 Comment: walking 117  Ht 5' 2.5" (1.588 m)   Wt 212 lb 1.9 oz (96.2 kg)   SpO2 97% Comment: at rest  BMI 38.18 kg/m  .  BMI Body  mass index is 38.18 kg/m.  Wt Readings from Last 3 Encounters:  03/29/18 212 lb 1.9 oz (96.2 kg)  04/23/17 212 lb (96.2 kg)  04/14/17 212 lb 6 oz (96.3 kg)    General: Pleasant. She is very talkative. She is alert and in no acute distress. She is obese.    HEENT: Normal.  Neck: Supple, no JVD, carotid bruits, or masses noted.  Cardiac: Regular rate and rhythm. Harsh outflow murmur. No edema.  Respiratory:  Lungs are clear to auscultation bilaterally with normal work of breathing.  GI: Soft and nontender.  MS: No deformity or atrophy. Gait and ROM intact.  Skin: Warm and dry. Color is normal.  Neuro:  Strength and sensation are intact and no gross focal deficits noted.  Psych: Alert, appropriate and with normal affect.   LABORATORY DATA:  EKG:  EKG is ordered today. This demonstrates NSR.  Lab Results  Component Value Date   WBC 12.0 (H) 04/24/2017   HGB 10.1 (L) 04/24/2017   HCT 30.3 (L) 04/24/2017   PLT 175 04/24/2017   GLUCOSE 192 (H) 04/24/2017   CHOL 153 05/11/2017   TRIG 281 (H) 05/11/2017   HDL 29 (L) 05/11/2017   LDLCALC 68 05/11/2017   ALT 21 05/11/2017   AST 18 05/11/2017   NA 135 04/24/2017   K 4.1 04/24/2017   CL 103 04/24/2017   CREATININE 1.23 (H) 04/24/2017   BUN 18 04/24/2017   CO2 24 04/24/2017   INR 1.01 08/06/2016   HGBA1C 7.6 (H) 04/14/2017     BNP (last 3 results) No results for input(s): BNP in the  last 8760 hours.  ProBNP (last 3 results) No results for input(s): PROBNP in the last 8760 hours.   Other Studies Reviewed Today:  Echo Study Conclusions July 2019  - Left ventricle: The cavity size was normal. Wall thickness was   increased in a pattern of mild LVH. Systolic function was normal.   The estimated ejection fraction was in the range of 60% to 65%.   Wall motion was normal; there were no regional wall motion   abnormalities. Doppler parameters are consistent with abnormal   left ventricular relaxation (grade 1 diastolic dysfunction). - Aortic valve: Valve mobility was restricted. There was severe   stenosis. There was trivial regurgitation. - Mitral valve: Calcified annulus. Mildly thickened leaflets .  Impressions:  - Normal LV systolic function; mild diastolic dysfunction; mild   LVH; calcified aortic valve with severe AS (mean gradient 41   mmHg) and trace AI.   Aortic valve                              Value          Reference  Aortic valve peak velocity, S             437   cm/s     ---------  Aortic valve mean velocity, S             300   cm/s     ---------  Aortic valve VTI, S                       98    cm       ---------  Aortic mean gradient, S                   41    mm Hg    ---------  Aortic peak gradient, S                   76    mm Hg    ---------  VTI ratio, LVOT/AV                        0.25           ---------  Aortic valve area, VTI                    0.88  cm^2     ---------  Aortic valve area/bsa, VTI                0.42  cm^2/m^2 ---------  Velocity ratio, peak, LVOT/AV             0.23           ---------  Aortic valve area, peak velocity          0.79  cm^2     ---------  Aortic valve area/bsa, peak               0.37  cm^2/m^2 ---------  velocity  Velocity ratio, mean, LVOT/AV             0.24           ---------  Aortic valve area, mean velocity          0.84  cm^2     ---------  Aortic valve area/bsa, mean               0.4    cm^2/m^2 ---------  velocity    Coronary Stent Intervention 07/2016  Right/Left Heart Cath and Coronary Angiography  Conclusion     Prox RCA to Mid RCA lesion, 0 %stenosed.  Prox LAD to Mid LAD lesion, 60 %stenosed.  Hemodynamic findings consistent with aortic valve stenosis.  Mid RCA lesion, 95 %stenosed.  Post intervention, there is a 0% residual stenosis.  A stent was successfully placed.   Sarah Stevenson is a 62 y.o. female    [id number] LOCATION:  FACILITY: [facility]  PHYSICIAN: Quay Burow, M.D. 04-27-1956       Assessment/Plan:  1. Progressive AS - she is having more shortness of breath, chest burning and using NTG again. Discussed with Dr. Tamala Julian - she has progressive AS - left and right cardiac cath is recommended. The patient understands that risks include but are not limited to stroke (1 in 1000), death (1 in 60), kidney failure [usually temporary] (1 in 500), bleeding (1 in 200), allergic reaction [possibly serious] (1 in 200), and agrees to proceed. This is scheduled for Friday with Dr. Tamala Julian. He would like her referred on to Dr. Servando Snare for consideration of AVR +/- CABG. Lab today.   2. CAD with prior PCI - last in November of 2017 - see above.   3. DM - she will need to hold her diabetic agents accordingly.   4. OA  5. HLD - difficult for her to tolerate her statin - may need to consider referral to the lipid clinic.   6. Obesity  7. Aspirin allergy.   Current medicines are reviewed with the patient today.  The patient does not have concerns regarding medicines other than what has been noted above.  The following changes have been made:  See above.  Labs/ tests ordered today include:    Orders Placed This Encounter  Procedures  . Comprehensive metabolic panel  .  CBC  . EKG 12-Lead     Disposition:   Further disposition pending.   Patient is agreeable to this plan and will call if any problems develop in the interim.    SignedTruitt Merle, NP  03/29/2018 2:30 PM  [facility] [address] [place], [place]  [address] Phone: [phone] Fax: [phone]

## 2018-03-29 NOTE — Telephone Encounter (Signed)
Pt called stating that her medication was sent to wrong pharmacy. I resent pt's medication to the correct pharmacy as requested by pt. Confirmation received.

## 2018-03-29 NOTE — Patient Instructions (Addendum)
We will be checking the following labs today - CMET & CBC   Medication Instructions:    Continue with your current medicines.     Testing/Procedures To Be Arranged:  Left and right heart catheterization   Follow-Up:   You have been referred to Dr. Servando Snare for consideration of CABG and AVR    Other Special Instructions:  Your provider has recommended a left and right cardiac catherization  You are scheduled for a cardiac catheterization on Friday, July 26th at 12 noon with Dr. Tamala Julian or associate.  Please arrive at the [facility] (Main Entrance) at [facility] at [address], [place] Stay on Friday, July 26th at [place] note: Every effort is made to have your procedure done on time.   Please understand that emergencies sometimes delay a scheduled   procedure.  No solid feeds after midnight on Thursday. You may have clear liquids until 5 am on the day of your procedure.  On the morning of your procedure, take your aspirin.   You may take your morning medications with a sip of water on the day of your procedure.  Please take Effient on the morning of your procedure.   Medications to HOLD - Glucotrol  HOLD CELEBREX AS OF NOW HOLD GLUCOPHAGE THE DAY PRIOR AND 2 DAYS AFTER  Plan for a one night stay -- bring personal belongings.  Bring a current list of your medications and current insurance cards.  You MUST have a responsible person to drive you home. Someone MUST be with you the first 24 hours after you arrive home or your discharge will be delayed. Wear clothes that are easy to get on and off and wear slip on shoes.    Coronary Angiogram A coronary angiogram, also called coronary angiography, is an X-ray procedure used to look at the arteries in the heart. In this procedure, a dye (contrast dye) is injected through a long, hollow tube (catheter). The catheter is about the size of a piece of cooked spaghetti and is inserted  through your groin, wrist, or arm. The dye is injected into each artery, and X-rays are then taken to show if there is a blockage in the arteries of your heart.  LET [facility] CARE PROVIDER KNOW ABOUT: Any allergies you have, including allergies to shellfish or contrast dye.   All medicines you are taking, including vitamins, herbs, eye drops, creams, and over-the-counter medicines.   Previous problems you or members of your family have had with the use of anesthetics.   Any blood disorders you have.   Previous surgeries you have had. History of kidney problems or failure.   Other medical conditions you have.  RISKS AND COMPLICATIONS  Generally, a coronary angiogram is a safe procedure. However, about 1 person out of 1000 can have problems that may include: Allergic reaction to the dye. Bleeding/bruising from the access site or other locations. Kidney injury, especially in people with impaired kidney function.  Stroke (rare). Heart attack (rare). Irregular rhythms (rare) Death (rare)  BEFORE THE PROCEDURE  Do not eat or drink anything after midnight the night before the procedure or as directed by your health care provider.   Ask your health care provider about changing or stopping your regular medicines. This is especially important if you are taking diabetes medicines or blood thinners.  PROCEDURE You may be given a medicine to help you relax (sedative) before the procedure. This medicine is  given through an intravenous (IV) access tube that is inserted into one of your veins.   The area where the catheter will be inserted will be washed and shaved. This is usually done in the groin but may be done in the fold of your arm (near your elbow) or in the wrist.    A medicine will be given to numb the area where the catheter will be inserted (local anesthetic).   The health care provider will insert the catheter into an artery. The catheter will be guided by using a special type of X-ray  (fluoroscopy) of the blood vessel being examined.   A special dye will then be injected into the catheter, and X-rays will be taken. The dye will help to show where any narrowing or blockages are located in the heart arteries.     AFTER THE PROCEDURE  If the procedure is done through the leg, you will be kept in bed lying flat for several hours. You will be instructed to not bend or cross your legs. The insertion site will be checked frequently.   The pulse in your feet or wrist will be checked frequently.   Additional blood tests, X-rays, and an electrocardiogram may be done.       If you need a refill on your cardiac medications before your next appointment, please call your pharmacy.   Call the [place] office at [phone] if you have any questions, problems or concerns.

## 2018-03-29 NOTE — H&P (View-Only) (Signed)
CARDIOLOGY OFFICE NOTE  Date:  03/29/2018    Sarah Stevenson Date of Birth: 10-Aug-1956 Medical Record #[id number]  PCP:  Sarah Sacramento, MD  Cardiologist:  Sarah Stevenson    Chief Complaint  Patient presents with  . Aortic Stenosis    1 year check - seen for Sarah Stevenson    History of Present Illness: Sarah Stevenson is a 62 y.o. female who presents today for a one year check. Seen for Sarah Stevenson.   She has a history of CAD with prior RCA DES in March of 2017 and to the mid RCA in November 2017 in the setting of angina, AS, DM, HLD, and OA. She has a bicuspid aortic valve. She has residual LAD disease from last cath in November of 2017.   She was last seen a year ago - had stable DOE. More limited by left hip pain from her arthritis.   Comes in today. Here with her friend Sarah Stevenson.  She is not doing well. Her shortness of breath has progressed. She has noted more elevated HR with activity. She is doing chelation thru a study at [facility]. She is having more chest burning with walking. She is now working at [employer] on [address]. She has a considerable way to walk from the parking lot to the unit. She will have to stop due to the incline on the way back to her car. She has used several NTG several times over the last month. Her chest burning and shortness of breath is progressive over the past month. She is having more myalgias - she has cut her statin back to three times a week.   SHE IS ASPIRIN ALLERGIC  Past Medical History:  Diagnosis Date  . Anginal pain ([place])   . Coronary artery disease   . Depression    "mild" (08/06/2016)  . Hyperlipidemia   . Murmur, cardiac   . Myocardial infarction ([place]) 2016  . Osteoarthritis    "left hip and knee" (08/06/2016)  . Type II diabetes mellitus ([place])     Past Surgical History:  Procedure Laterality Date  . CARDIAC CATHETERIZATION Right 11/30/2014   Procedure: RIGHT/LEFT HEART CATH AND CORONARY ANGIOGRAPHY;  Surgeon: Sarah Crome, MD;   Location: [facility] CATH LAB;  Service: Cardiovascular;  Laterality: Right;  . CARDIAC CATHETERIZATION N/A 12/13/2015   Procedure: Left Heart Cath and Coronary Angiography;  Surgeon: Sarah Man, MD;  Location: [place] CV LAB;  Service: Cardiovascular;  Laterality: N/A;  . CARDIAC CATHETERIZATION N/A 12/13/2015   Procedure: Intravascular Pressure Wire/FFR Study;  Surgeon: Sarah Man, MD;  Location: [place] CV LAB;  Service: Cardiovascular;  Laterality: N/A;  RCA  . CARDIAC CATHETERIZATION N/A 08/06/2016   Procedure: Right/Left Heart Cath and Coronary Angiography;  Surgeon: Sarah Harp, MD;  Location: [place] CV LAB;  Service: Cardiovascular;  Laterality: N/A;  . CARDIAC CATHETERIZATION N/A 08/06/2016   Procedure: Coronary Stent Intervention;  Surgeon: Sarah Harp, MD;  Location: [place] CV LAB;  Service: Cardiovascular;  Laterality: N/A;   distal rca 3.0x16 and 3.0x8 synergy  . CESAREAN SECTION  1989  . CORONARY ANGIOPLASTY WITH STENT PLACEMENT  08/06/2016   "2 stents"  . HYSTEROSCOPY DIAGNOSTIC  1990s  . JOINT REPLACEMENT    . KNEE ARTHROSCOPY Left 1996  . NASAL SINUS SURGERY  1977  . TOTAL HIP ARTHROPLASTY Left 04/23/2017   Procedure: LEFT TOTAL HIP ARTHROPLASTY ANTERIOR APPROACH;  Surgeon: Sarah Stevenson,  Sarah Guest, MD;  Location: [facility] ORS;  Service: Orthopedics;  Laterality: Left;     Medications: Current Meds  Medication Sig  . acetaminophen (TYLENOL) 325 MG tablet Take 650 mg by mouth every 6 (six) hours as needed for mild pain or headache.  . carvedilol (COREG) 3.125 MG tablet TAKE 1 TABLET BY MOUTH TWICE DAILY WITH A MEAL. Please keep upcoming appt in July for future refills. Thank you  . celecoxib (CELEBREX) 200 MG capsule Take 200 mg by mouth daily.  . cetirizine (ZYRTEC) 10 MG tablet Take 10 mg by mouth daily.  . Dulaglutide (TRULICITY [place]) Inject 6.29 mg into the skin every 7 (seven) days. sunday  . glipiZIDE (GLUCOTROL XL) 10 MG 24 hr tablet Take 10 mg by  mouth daily with breakfast.   . isosorbide mononitrate (IMDUR) 30 MG 24 hr tablet TAKE 1 TABLET (30 MG TOTAL) BY MOUTH DAILY.  Marland Kitchen losartan (COZAAR) 50 MG tablet Take 50 mg by mouth daily.   . Menthol-Methyl Salicylate (MUSCLE RUB EX) Apply 1 application topically as needed (for pain).  . metFORMIN (GLUCOPHAGE-XR) 750 MG 24 hr tablet Take 1,500 mg by mouth every evening.  . methocarbamol (ROBAXIN) 500 MG tablet Take 1 tablet (500 mg total) by mouth every 6 (six) hours as needed for muscle spasms.  . Misc Natural Products (TURMERIC CURCUMIN) CAPS Take 2 capsules by mouth daily. 2400 mg total  . Multiple Vitamin (MULTIVITAMIN WITH MINERALS) TABS tablet Take 1 tablet by mouth daily.  . nitroGLYCERIN (NITROSTAT) 0.4 MG SL tablet Place 1 tablet (0.4 mg total) under the tongue every 5 (five) minutes as needed for chest pain (Call 911 at 3rd dose within 15 minutes.).  Marland Kitchen oxyCODONE-acetaminophen (ROXICET) 5-325 MG tablet Take 1-2 tablets by mouth every 4 (four) hours as needed for severe pain.  . prasugrel (EFFIENT) 10 MG TABS tablet Take 1 tablet (10 mg total) by mouth daily.  . ranitidine (ZANTAC) 150 MG tablet Take 150 mg by mouth every evening.   . rosuvastatin (CRESTOR) 40 MG tablet Take 20 mg by mouth as directed. Takes by mouth one half tablet (20 mg ) three times a week.  . sertraline (ZOLOFT) 50 MG tablet Take 50 mg by mouth daily.     Allergies: Allergies  Allergen Reactions  . Asa [Aspirin] Hives and Rash  . Blue Dyes (Parenteral) Hives  . Corn-Containing Products Hives  . Hetastarch Hives and Rash  . Isosulfan Blue Rash  . Nsaids Hives and Rash  . Sugar-Protein-Starch Hives  . Tea Hives    Social History: The patient  reports that she quit smoking about 37 years ago. Her smoking use included cigarettes. She has a 10.00 pack-year smoking history. She has never used smokeless tobacco. She reports that she drinks about 4.2 oz of alcohol per week. She reports that she does not use  drugs.   Family History: The patient's family history includes Heart attack in her father and mother; Sudden death in her father.   Review of Systems: Please see the history of present illness.   Otherwise, the review of systems is positive for none.   All other systems are reviewed and negative.   Physical Exam: VS:  BP 120/68 (BP Location: Left Arm, Patient Position: Sitting, Cuff Size: Normal)   Pulse 94 Comment: walking 117  Ht 5' 2.5" (1.588 m)   Wt 212 lb 1.9 oz (96.2 kg)   SpO2 97% Comment: at rest  BMI 38.18 kg/m  .  BMI Body  mass index is 38.18 kg/m.  Wt Readings from Last 3 Encounters:  03/29/18 212 lb 1.9 oz (96.2 kg)  04/23/17 212 lb (96.2 kg)  04/14/17 212 lb 6 oz (96.3 kg)    General: Pleasant. She is very talkative. She is alert and in no acute distress. She is obese.    HEENT: Normal.  Neck: Supple, no JVD, carotid bruits, or masses noted.  Cardiac: Regular rate and rhythm. Harsh outflow murmur. No edema.  Respiratory:  Lungs are clear to auscultation bilaterally with normal work of breathing.  GI: Soft and nontender.  MS: No deformity or atrophy. Gait and ROM intact.  Skin: Warm and dry. Color is normal.  Neuro:  Strength and sensation are intact and no gross focal deficits noted.  Psych: Alert, appropriate and with normal affect.   LABORATORY DATA:  EKG:  EKG is ordered today. This demonstrates NSR.  Lab Results  Component Value Date   WBC 12.0 (H) 04/24/2017   HGB 10.1 (L) 04/24/2017   HCT 30.3 (L) 04/24/2017   PLT 175 04/24/2017   GLUCOSE 192 (H) 04/24/2017   CHOL 153 05/11/2017   TRIG 281 (H) 05/11/2017   HDL 29 (L) 05/11/2017   LDLCALC 68 05/11/2017   ALT 21 05/11/2017   AST 18 05/11/2017   NA 135 04/24/2017   K 4.1 04/24/2017   CL 103 04/24/2017   CREATININE 1.23 (H) 04/24/2017   BUN 18 04/24/2017   CO2 24 04/24/2017   INR 1.01 08/06/2016   HGBA1C 7.6 (H) 04/14/2017     BNP (last 3 results) No results for input(s): BNP in the  last 8760 hours.  ProBNP (last 3 results) No results for input(s): PROBNP in the last 8760 hours.   Other Studies Reviewed Today:  Echo Study Conclusions July 2019  - Left ventricle: The cavity size was normal. Wall thickness was   increased in a pattern of mild LVH. Systolic function was normal.   The estimated ejection fraction was in the range of 60% to 65%.   Wall motion was normal; there were no regional wall motion   abnormalities. Doppler parameters are consistent with abnormal   left ventricular relaxation (grade 1 diastolic dysfunction). - Aortic valve: Valve mobility was restricted. There was severe   stenosis. There was trivial regurgitation. - Mitral valve: Calcified annulus. Mildly thickened leaflets .  Impressions:  - Normal LV systolic function; mild diastolic dysfunction; mild   LVH; calcified aortic valve with severe AS (mean gradient 41   mmHg) and trace AI.   Aortic valve                              Value          Reference  Aortic valve peak velocity, S             437   cm/s     ---------  Aortic valve mean velocity, S             300   cm/s     ---------  Aortic valve VTI, S                       98    cm       ---------  Aortic mean gradient, S                   41    mm Hg    ---------  Aortic peak gradient, S                   76    mm Hg    ---------  VTI ratio, LVOT/AV                        0.25           ---------  Aortic valve area, VTI                    0.88  cm^2     ---------  Aortic valve area/bsa, VTI                0.42  cm^2/m^2 ---------  Velocity ratio, peak, LVOT/AV             0.23           ---------  Aortic valve area, peak velocity          0.79  cm^2     ---------  Aortic valve area/bsa, peak               0.37  cm^2/m^2 ---------  velocity  Velocity ratio, mean, LVOT/AV             0.24           ---------  Aortic valve area, mean velocity          0.84  cm^2     ---------  Aortic valve area/bsa, mean               0.4    cm^2/m^2 ---------  velocity    Coronary Stent Intervention 07/2016  Right/Left Heart Cath and Coronary Angiography  Conclusion     Prox RCA to Mid RCA lesion, 0 %stenosed.  Prox LAD to Mid LAD lesion, 60 %stenosed.  Hemodynamic findings consistent with aortic valve stenosis.  Mid RCA lesion, 95 %stenosed.  Post intervention, there is a 0% residual stenosis.  A stent was successfully placed.   Sarah Stevenson is a 62 y.o. female    [id number] LOCATION:  FACILITY: [facility]  PHYSICIAN: Quay Burow, M.D. [birth year]       Assessment/Plan:  1. Progressive AS - she is having more shortness of breath, chest burning and using NTG again. Discussed with Sarah Stevenson - she has progressive AS - left and right cardiac cath is recommended. The patient understands that risks include but are not limited to stroke (1 in 1000), death (1 in 68), kidney failure [usually temporary] (1 in 500), bleeding (1 in 200), allergic reaction [possibly serious] (1 in 200), and agrees to proceed. This is scheduled for Friday with Sarah Stevenson. He would like her referred on to Dr. Servando Snare for consideration of AVR +/- CABG. Lab today.   2. CAD with prior PCI - last in November of 2017 - see above.   3. DM - she will need to hold her diabetic agents accordingly.   4. OA  5. HLD - difficult for her to tolerate her statin - may need to consider referral to the lipid clinic.   6. Obesity  7. Aspirin allergy.   Current medicines are reviewed with the patient today.  The patient does not have concerns regarding medicines other than what has been noted above.  The following changes have been made:  See above.  Labs/ tests ordered today include:    Orders Placed This Encounter  Procedures  . Comprehensive metabolic panel  .  CBC  . EKG 12-Lead     Disposition:   Further disposition pending.   Patient is agreeable to this plan and will call if any problems develop in the interim.    SignedTruitt Merle, NP  03/29/2018 2:30 PM  [facility] [address] [place], [place]  [address] Phone: [phone] Fax: [phone]

## 2018-03-30 ENCOUNTER — Other Ambulatory Visit: Payer: Self-pay | Admitting: Nurse Practitioner

## 2018-03-30 DIAGNOSIS — I350 Nonrheumatic aortic (valve) stenosis: Secondary | ICD-10-CM

## 2018-03-30 LAB — COMPREHENSIVE METABOLIC PANEL
ALT: 23 IU/L (ref 0–32)
AST: 24 IU/L (ref 0–40)
Albumin/Globulin Ratio: 1.6 (ref 1.2–2.2)
Albumin: 4.2 g/dL (ref 3.6–4.8)
Alkaline Phosphatase: 104 IU/L (ref 39–117)
BUN/Creatinine Ratio: 12 (ref 12–28)
BUN: 13 mg/dL (ref 8–27)
Bilirubin Total: 0.4 mg/dL (ref 0.0–1.2)
CO2: 24 mmol/L (ref 20–29)
Calcium: 9.4 mg/dL (ref 8.7–10.3)
Chloride: 99 mmol/L (ref 96–106)
Creatinine, Ser: 1.13 mg/dL — ABNORMAL HIGH (ref 0.57–1.00)
GFR calc Af Amer: 61 mL/min/{1.73_m2} (ref >59)
GFR calc non Af Amer: 53 mL/min/{1.73_m2} — ABNORMAL LOW (ref >59)
Globulin, Total: 2.6 g/dL (ref 1.5–4.5)
Glucose: 205 mg/dL — ABNORMAL HIGH (ref 65–99)
Potassium: 4.7 mmol/L (ref 3.5–5.2)
Sodium: 140 mmol/L (ref 134–144)
Total Protein: 6.8 g/dL (ref 6.0–8.5)

## 2018-03-30 LAB — CBC
Hematocrit: 38.8 % (ref 34.0–46.6)
Hemoglobin: 13.6 g/dL (ref 11.1–15.9)
MCH: 32.9 pg (ref 26.6–33.0)
MCHC: 35.1 g/dL (ref 31.5–35.7)
MCV: 94 fL (ref 79–97)
Platelets: 225 10*3/uL (ref 150–450)
RBC: 4.13 x10E6/uL (ref 3.77–5.28)
RDW: 13.6 % (ref 12.3–15.4)
WBC: 9.5 10*3/uL (ref 3.4–10.8)

## 2018-03-31 ENCOUNTER — Telehealth: Payer: Self-pay | Admitting: *Deleted

## 2018-03-31 NOTE — Telephone Encounter (Signed)
Pt contacted pre-catheterization scheduled at [facility] for: Friday April 01, 2018 12 noon Verified arrival time and place: [facility] Entrance A at: 10 AM  No solid food after midnight prior to cath, clear liquids until 5 AM day of procedure. Verified allergies in Epic.-I  discussed blue dye allergy listed in Epic with patient, pt states this is mainly a food dye allergy, she has tolerated IV contrast in the past  without side effects.  Hold: Celebrex-day before and day of procedure. Losartan -day before and day of procedure. Glipizide AM of procedure Metformin -day of procedure and 48 hours post procedure.   Except hold medications AM meds can be  taken pre-cath with sip of water including: Effient 10 mg Pt states she is allergic to aspirin.   Confirmed patient has responsible person to drive home post procedure and for 24 hours after you arrive home: yes

## 2018-04-01 ENCOUNTER — Ambulatory Visit (HOSPITAL_COMMUNITY)
Admission: RE | Admit: 2018-04-01 | Discharge: 2018-04-01 | Disposition: A | Discharge status: Home / Self Care | Payer: 59 | Source: Ambulatory Visit | Attending: Interventional Cardiology | Admitting: Interventional Cardiology

## 2018-04-01 ENCOUNTER — Other Ambulatory Visit: Payer: Self-pay

## 2018-04-01 ENCOUNTER — Encounter (HOSPITAL_COMMUNITY)
Admission: RE | Disposition: A | Discharge status: Home / Self Care | Payer: Self-pay | Source: Ambulatory Visit | Attending: Interventional Cardiology

## 2018-04-01 DIAGNOSIS — T82855A Stenosis of coronary artery stent, initial encounter: Secondary | ICD-10-CM | POA: Insufficient documentation

## 2018-04-01 DIAGNOSIS — Z87891 Personal history of nicotine dependence: Secondary | ICD-10-CM | POA: Diagnosis not present

## 2018-04-01 DIAGNOSIS — Y831 Surgical operation with implant of artificial internal device as the cause of abnormal reaction of the patient, or of later complication, without mention of misadventure at the time of the procedure: Secondary | ICD-10-CM | POA: Insufficient documentation

## 2018-04-01 DIAGNOSIS — E119 Type 2 diabetes mellitus without complications: Secondary | ICD-10-CM | POA: Insufficient documentation

## 2018-04-01 DIAGNOSIS — I2584 Coronary atherosclerosis due to calcified coronary lesion: Secondary | ICD-10-CM | POA: Insufficient documentation

## 2018-04-01 DIAGNOSIS — I2510 Atherosclerotic heart disease of native coronary artery without angina pectoris: Secondary | ICD-10-CM | POA: Diagnosis not present

## 2018-04-01 DIAGNOSIS — E785 Hyperlipidemia, unspecified: Secondary | ICD-10-CM | POA: Diagnosis not present

## 2018-04-01 DIAGNOSIS — I350 Nonrheumatic aortic (valve) stenosis: Secondary | ICD-10-CM | POA: Insufficient documentation

## 2018-04-01 DIAGNOSIS — Z6838 Body mass index (BMI) 38.0-38.9, adult: Secondary | ICD-10-CM | POA: Insufficient documentation

## 2018-04-01 DIAGNOSIS — I252 Old myocardial infarction: Secondary | ICD-10-CM | POA: Diagnosis not present

## 2018-04-01 DIAGNOSIS — E669 Obesity, unspecified: Secondary | ICD-10-CM | POA: Diagnosis not present

## 2018-04-01 DIAGNOSIS — Z8249 Family history of ischemic heart disease and other diseases of the circulatory system: Secondary | ICD-10-CM | POA: Insufficient documentation

## 2018-04-01 DIAGNOSIS — F329 Major depressive disorder, single episode, unspecified: Secondary | ICD-10-CM | POA: Insufficient documentation

## 2018-04-01 DIAGNOSIS — R0609 Other forms of dyspnea: Secondary | ICD-10-CM

## 2018-04-01 DIAGNOSIS — R0600 Dyspnea, unspecified: Secondary | ICD-10-CM

## 2018-04-01 DIAGNOSIS — M1990 Unspecified osteoarthritis, unspecified site: Secondary | ICD-10-CM | POA: Diagnosis not present

## 2018-04-01 DIAGNOSIS — Z7984 Long term (current) use of oral hypoglycemic drugs: Secondary | ICD-10-CM | POA: Insufficient documentation

## 2018-04-01 DIAGNOSIS — R0683 Snoring: Secondary | ICD-10-CM | POA: Diagnosis present

## 2018-04-01 HISTORY — PX: RIGHT/LEFT HEART CATH AND CORONARY ANGIOGRAPHY: CATH118266

## 2018-04-01 LAB — POCT I-STAT 3, VENOUS BLOOD GAS (G3P V)
Acid-base deficit: 1 mmol/L (ref 0.0–2.0)
Acid-base deficit: 1 mmol/L (ref 0.0–2.0)
BICARBONATE: 24.3 mmol/L (ref 20.0–28.0)
Bicarbonate: 24.8 mmol/L (ref 20.0–28.0)
O2 Saturation: 70 %
O2 Saturation: 73 %
PCO2 VEN: 44.1 mmHg (ref 44.0–60.0)
PH VEN: 7.349 (ref 7.250–7.430)
PO2 VEN: 41 mmHg (ref 32.0–45.0)
TCO2: 26 mmol/L (ref 22–32)
TCO2: 26 mmol/L (ref 22–32)
pCO2, Ven: 43.5 mmHg — ABNORMAL LOW (ref 44.0–60.0)
pH, Ven: 7.364 (ref 7.250–7.430)
pO2, Ven: 38 mmHg (ref 32.0–45.0)

## 2018-04-01 LAB — POCT I-STAT 3, ART BLOOD GAS (G3+)
Acid-base deficit: 1 mmol/L (ref 0.0–2.0)
BICARBONATE: 23.8 mmol/L (ref 20.0–28.0)
O2 SAT: 94 %
PCO2 ART: 41.2 mmHg (ref 32.0–48.0)
PO2 ART: 74 mmHg — AB (ref 83.0–108.0)
TCO2: 25 mmol/L (ref 22–32)
pH, Arterial: 7.37 (ref 7.350–7.450)

## 2018-04-01 LAB — GLUCOSE, CAPILLARY: Glucose-Capillary: 244 mg/dL — ABNORMAL HIGH (ref 70–99)

## 2018-04-01 SURGERY — RIGHT/LEFT HEART CATH AND CORONARY ANGIOGRAPHY
Anesthesia: LOCAL

## 2018-04-01 MED ORDER — PRASUGREL HCL 10 MG PO TABS: 10.0000 mg | ORAL_TABLET | ORAL | Status: DC

## 2018-04-01 MED ORDER — LIDOCAINE HCL (PF) 1 % IJ SOLN
INTRAMUSCULAR | Status: AC
  Filled 2018-04-01: qty 30

## 2018-04-01 MED ORDER — LIDOCAINE HCL (PF) 1 % IJ SOLN
INTRAMUSCULAR | Status: DC | PRN
  Administered 2018-04-01: 2 mL

## 2018-04-01 MED ORDER — DIAZEPAM 5 MG PO TABS
10.0000 mg | ORAL_TABLET | ORAL | Status: AC
  Administered 2018-04-01: 10 mg via ORAL

## 2018-04-01 MED ORDER — SODIUM CHLORIDE 0.9% FLUSH: 3.0000 mL | INTRAVENOUS | Status: DC | PRN

## 2018-04-01 MED ORDER — SODIUM CHLORIDE 0.9% FLUSH: 3.0000 mL | Freq: Two times a day (BID) | INTRAVENOUS | Status: DC

## 2018-04-01 MED ORDER — FENTANYL CITRATE (PF) 100 MCG/2ML IJ SOLN
INTRAMUSCULAR | Status: AC
  Filled 2018-04-01: qty 2

## 2018-04-01 MED ORDER — HEPARIN (PORCINE) IN NACL 1000-0.9 UT/500ML-% IV SOLN
INTRAVENOUS | Status: DC | PRN
  Administered 2018-04-01 (×2): 500 mL

## 2018-04-01 MED ORDER — MIDAZOLAM HCL 2 MG/2ML IJ SOLN
INTRAMUSCULAR | Status: AC
  Filled 2018-04-01: qty 2

## 2018-04-01 MED ORDER — HEPARIN SODIUM (PORCINE) 1000 UNIT/ML IJ SOLN
INTRAMUSCULAR | Status: DC | PRN
  Administered 2018-04-01: 4500 [IU] via INTRAVENOUS

## 2018-04-01 MED ORDER — VERAPAMIL HCL 2.5 MG/ML IV SOLN
INTRAVENOUS | Status: AC
  Filled 2018-04-01: qty 2

## 2018-04-01 MED ORDER — SODIUM CHLORIDE 0.9 % IV SOLN: INTRAVENOUS | Status: DC

## 2018-04-01 MED ORDER — HEPARIN (PORCINE) IN NACL 1000-0.9 UT/500ML-% IV SOLN
INTRAVENOUS | Status: AC
  Filled 2018-04-01: qty 500

## 2018-04-01 MED ORDER — FENTANYL CITRATE (PF) 100 MCG/2ML IJ SOLN
INTRAMUSCULAR | Status: DC | PRN
  Administered 2018-04-01: 50 ug via INTRAVENOUS

## 2018-04-01 MED ORDER — VERAPAMIL HCL 2.5 MG/ML IV SOLN
INTRAVENOUS | Status: DC | PRN
  Administered 2018-04-01: 10 mL via INTRA_ARTERIAL

## 2018-04-01 MED ORDER — HEPARIN SODIUM (PORCINE) 1000 UNIT/ML IJ SOLN
INTRAMUSCULAR | Status: AC
  Filled 2018-04-01: qty 1

## 2018-04-01 MED ORDER — ACETAMINOPHEN 325 MG PO TABS: 650.0000 mg | ORAL_TABLET | ORAL | Status: DC | PRN

## 2018-04-01 MED ORDER — MIDAZOLAM HCL 2 MG/2ML IJ SOLN
INTRAMUSCULAR | Status: DC | PRN
  Administered 2018-04-01: 1 mg via INTRAVENOUS

## 2018-04-01 MED ORDER — DIAZEPAM 5 MG PO TABS
ORAL_TABLET | ORAL | Status: AC
  Administered 2018-04-01: 10 mg via ORAL
  Filled 2018-04-01: qty 2

## 2018-04-01 MED ORDER — IOHEXOL 350 MG/ML SOLN
INTRAVENOUS | Status: DC | PRN
  Administered 2018-04-01: 90 mL via INTRA_ARTERIAL

## 2018-04-01 MED ORDER — OXYCODONE HCL 5 MG PO TABS: 5.0000 mg | ORAL_TABLET | ORAL | Status: DC | PRN

## 2018-04-01 MED ORDER — SODIUM CHLORIDE 0.9 % IV SOLN: 250.0000 mL | INTRAVENOUS | Status: DC | PRN

## 2018-04-01 MED ORDER — SODIUM CHLORIDE 0.9 % IV SOLN
INTRAVENOUS | Status: DC
  Administered 2018-04-01: 11:00:00 via INTRAVENOUS

## 2018-04-01 SURGICAL SUPPLY — 14 items
CATH 5FR JL3.5 JR4 ANG PIG MP (CATHETERS) ×1 IMPLANT
CATH BALLN WEDGE 5F 110CM (CATHETERS) ×1 IMPLANT
DEVICE RAD COMP TR BAND LRG (VASCULAR PRODUCTS) ×1 IMPLANT
GLIDESHEATH SLEND A-KIT 6F 22G (SHEATH) ×1 IMPLANT
GUIDEWIRE INQWIRE 1.5J.035X260 (WIRE) IMPLANT
INQWIRE 1.5J .035X260CM (WIRE) ×2
KIT HEART LEFT (KITS) ×2 IMPLANT
PACK CARDIAC CATHETERIZATION (CUSTOM PROCEDURE TRAY) ×2 IMPLANT
SHEATH GLIDE SLENDER 4/5FR (SHEATH) ×1 IMPLANT
SHEATH PROBE COVER 6X72 (BAG) ×1 IMPLANT
TRANSDUCER W/STOPCOCK (MISCELLANEOUS) ×2 IMPLANT
TUBING CIL FLEX 10 FLL-RA (TUBING) ×2 IMPLANT
WIRE EMERALD 3MM-J .025X260CM (WIRE) ×1 IMPLANT
WIRE EMERALD ST .035X150CM (WIRE) ×1 IMPLANT

## 2018-04-01 NOTE — Interval H&P Note (Signed)
Cath Lab Visit (complete for each Cath Lab visit)  Clinical Evaluation Leading to the Procedure:   ACS: No.  Non-ACS:    Anginal Classification: CCS III  Anti-ischemic medical therapy: Maximal Therapy (2 or more classes of medications)  Non-Invasive Test Results: No non-invasive testing performed  Prior CABG: No previous CABG      History and Physical Interval Note:  04/01/2018 12:06 PM  Sarah Stevenson  has presented today for surgery, with the diagnosis of AS  The various methods of treatment have been discussed with the patient and family. After consideration of risks, benefits and other options for treatment, the patient has consented to  Procedure(s): RIGHT/LEFT HEART CATH AND CORONARY ANGIOGRAPHY (N/A) as a surgical intervention .  The patient's history has been reviewed, patient examined, no change in status, stable for surgery.  I have reviewed the patient's chart and labs.  Questions were answered to the patient's satisfaction.     Belva Crome III

## 2018-04-01 NOTE — Discharge Instructions (Signed)
NO METFORMIN/GLUCOPHAGE FOR 2 DAYS   Radial Site Care Refer to this sheet in the next few weeks. These instructions provide you with information about caring for yourself after your procedure. Your health care provider may also give you more specific instructions. Your treatment has been planned according to current medical practices, but problems sometimes occur. Call your health care provider if you have any problems or questions after your procedure. What can I expect after the procedure? After your procedure, it is typical to have the following:  Bruising at the radial site that usually fades within 1-2 weeks.  Blood collecting in the tissue (hematoma) that may be painful to the touch. It should usually decrease in size and tenderness within 1-2 weeks.  Follow these instructions at home:  Take medicines only as directed by your health care provider.  You may shower 24-48 hours after the procedure or as directed by your health care provider. Remove the bandage (dressing) and gently wash the site with plain soap and water. Pat the area dry with a clean towel. Do not rub the site, because this may cause bleeding.  Do not take baths, swim, or use a hot tub until your health care provider approves.  Check your insertion site every day for redness, swelling, or drainage.  Do not apply powder or lotion to the site.  Do not flex or bend the affected arm for 24 hours or as directed by your health care provider.  Do not push or pull heavy objects with the affected arm for 24 hours or as directed by your health care provider.  Do not lift over 10 lb (4.5 kg) for 5 days after your procedure or as directed by your health care provider.  Ask your health care provider when it is okay to: ? Return to work or school. ? Resume usual physical activities or sports. ? Resume sexual activity.  Do not drive home if you are discharged the same day as the procedure. Have someone else drive you.  You  may drive 24 hours after the procedure unless otherwise instructed by your health care provider.  Do not operate machinery or power tools for 24 hours after the procedure.  If your procedure was done as an outpatient procedure, which means that you went home the same day as your procedure, a responsible adult should be with you for the first 24 hours after you arrive home.  Keep all follow-up visits as directed by your health care provider. This is important. Contact a health care provider if:  You have a fever.  You have chills.  You have increased bleeding from the radial site. Hold pressure on the site. CALL 911 Get help right away if:  You have unusual pain at the radial site.  You have redness, warmth, or swelling at the radial site.  You have drainage (other than a small amount of blood on the dressing) from the radial site.  The radial site is bleeding, and the bleeding does not stop after 30 minutes of holding steady pressure on the site.  Your arm or hand becomes pale, cool, tingly, or numb. This information is not intended to replace advice given to you by your health care provider. Make sure you discuss any questions you have with your health care provider. Document Released: 09/26/2010 Document Revised: 01/30/2016 Document Reviewed: 03/12/2014 Elsevier Interactive Patient Education  2018 [employer].

## 2018-04-03 ENCOUNTER — Encounter (HOSPITAL_COMMUNITY): Payer: Self-pay | Admitting: Interventional Cardiology

## 2018-04-05 ENCOUNTER — Other Ambulatory Visit: Payer: Self-pay

## 2018-04-05 ENCOUNTER — Encounter: Payer: Self-pay | Admitting: Cardiothoracic Surgery

## 2018-04-05 ENCOUNTER — Institutional Professional Consult (permissible substitution) (INDEPENDENT_AMBULATORY_CARE_PROVIDER_SITE_OTHER): Payer: 59 | Admitting: Cardiothoracic Surgery

## 2018-04-05 VITALS — BP 151/86 | HR 84 | Resp 16 | Ht 62.25 in | Wt 213.0 lb

## 2018-04-05 DIAGNOSIS — I350 Nonrheumatic aortic (valve) stenosis: Secondary | ICD-10-CM | POA: Diagnosis not present

## 2018-04-05 DIAGNOSIS — I2510 Atherosclerotic heart disease of native coronary artery without angina pectoris: Secondary | ICD-10-CM

## 2018-04-05 NOTE — Progress Notes (Signed)
[address]       [place] [address]             [phone]                    Koleen A Glab [place] Medical Record #[id number] Date of Birth: 1956-08-01  Referring: Burtis Junes, NP Primary Care: Christain Sacramento, MD Primary Cardiologist: No primary care provider on file.  Chief Complaint:    Chief Complaint  Patient presents with  . Coronary Artery Disease    eval for CABG/AVR...ECHO 7/10, CATH 04/01/18  . Aortic Stenosis    History of Present Illness:    Sarah Stevenson 62 y.o. female is seen in the office  today for preop evaluation of aortic stenosis and coronary artery disease.  The patient noted increasing episodes of shortness of breath with exertion.  The symptoms have been progressive over the past 6 months.  The patient notes chest discomfort at times when walking across the parking lot to go to work, occasionally relieved by taking a nitroglycerin.  Recent cardiac catheterization and echocardiogram was performed because of the patient's increasing symptoms confirming a bicuspid aortic valve with severe aortic stenosis, by echo peak gradient estimated 76 mmHg mean gradient 41 mmHg, estimated valve area of 0.84    history of CAD with prior RCA DES in March of 2017 and to the mid RCA in November 2017 in the setting of angina, AS, DM, HLD, and OA. She has a bicuspid aortic valve. She has residual LAD disease from last cath in November of 2017.  Peak velocity across the aortic valve 437 cm a second mean velocity 300.     Current Activity/ Functional Status:  Patient is independent with mobility/ambulation, transfers, ADL's, IADL's.   Zubrod Score: At the time of surgery this patient's most appropriate activity status/level should be described as: []     0    Normal activity, no symptoms []     1    Restricted in physical strenuous activity but ambulatory, able to do out light work []     2    Ambulatory and capable of self care, unable to do  work activities, up and about               >50 % of waking hours                              []     3    Only limited self care, in bed greater than 50% of waking hours []     4    Completely disabled, no self care, confined to bed or chair []     5    Moribund   Past Medical History:  Diagnosis Date  . Anginal pain ([place])   . Coronary artery disease   . Depression    "mild" (08/06/2016)  . Hyperlipidemia   . Murmur, cardiac   . Myocardial infarction ([place]) 2016  . Osteoarthritis    "left hip and knee" (08/06/2016)  . Type II diabetes mellitus ([place])     Past Surgical History:  Procedure Laterality Date  . CARDIAC CATHETERIZATION Right 11/30/2014   Procedure: RIGHT/LEFT HEART CATH AND CORONARY ANGIOGRAPHY;  Surgeon: Belva Crome, MD;  Location: [facility] CATH LAB;  Service: Cardiovascular;  Laterality: Right;  . CARDIAC CATHETERIZATION N/A 12/13/2015   Procedure: Left Heart Cath and Coronary Angiography;  Surgeon: Leonie Man, MD;  Location: [facility] CV LAB;  Service: Cardiovascular;  Laterality: N/A;  . CARDIAC CATHETERIZATION N/A 12/13/2015   Procedure: Intravascular Pressure Wire/FFR Study;  Surgeon: Leonie Man, MD;  Location: [facility] CV LAB;  Service: Cardiovascular;  Laterality: N/A;  RCA  . CARDIAC CATHETERIZATION N/A 08/06/2016   Procedure: Right/Left Heart Cath and Coronary Angiography;  Surgeon: Lorretta Harp, MD;  Location: [facility] CV LAB;  Service: Cardiovascular;  Laterality: N/A;  . CARDIAC CATHETERIZATION N/A 08/06/2016   Procedure: Coronary Stent Intervention;  Surgeon: Lorretta Harp, MD;  Location: [facility] CV LAB;  Service: Cardiovascular;  Laterality: N/A;   distal rca 3.0x16 and 3.0x8 synergy  . CESAREAN SECTION  1989  . CORONARY ANGIOPLASTY WITH STENT PLACEMENT  08/06/2016   "2 stents"  . HYSTEROSCOPY DIAGNOSTIC  1990s  . JOINT REPLACEMENT    . KNEE ARTHROSCOPY Left 1996  . NASAL SINUS SURGERY  1977  . RIGHT/LEFT HEART CATH AND CORONARY  ANGIOGRAPHY N/A 04/01/2018   Procedure: RIGHT/LEFT HEART CATH AND CORONARY ANGIOGRAPHY;  Surgeon: Belva Crome, MD;  Location: [facility] CV LAB;  Service: Cardiovascular;  Laterality: N/A;  . TOTAL HIP ARTHROPLASTY Left 04/23/2017   Procedure: LEFT TOTAL HIP ARTHROPLASTY ANTERIOR APPROACH;  Surgeon: Mcarthur Rossetti, MD;  Location: [facility] ORS;  Service: Orthopedics;  Laterality: Left;    Family History  Problem Relation Age of Onset  . Heart attack Mother   . Heart attack Father   . Sudden death Father   . Hypertension Neg Hx   . Hyperlipidemia Neg Hx   . Diabetes Neg Hx      Social History   Tobacco Use  Smoking Status Former Smoker  . Packs/day: 1.00  . Years: 10.00  . Pack years: 10.00  . Types: Cigarettes  . Last attempt to quit: 1982  . Years since quitting: 37.6  Smokeless Tobacco Never Used    The patient  reports that she quit smoking about 37 years ago. Her smoking use included cigarettes. She has a 10.00 pack-year smoking history. She has never used smokeless tobacco. She reports that she drinks about 4.2 oz of alcohol per week. She reports that she does not use drugs.     Social History   Substance and Sexual Activity  Alcohol Use Yes  . Alcohol/week: 4.2 oz  . Types: 7 Shots of liquor per week     Allergies  Allergen Reactions  . Brilinta [Ticagrelor] Shortness Of Breath  . Asa [Aspirin] Hives and Rash  . Lisinopril Cough  . Blue Dyes (Parenteral) Hives  . Corn-Containing Products Hives  . Hetastarch Hives and Rash  . Isosulfan Blue Rash  . Nsaids Hives and Rash  . Sugar-Protein-Starch Hives  . Tea Hives    Current Outpatient Medications  Medication Sig Dispense Refill  . acetaminophen (TYLENOL) 325 MG tablet Take 650 mg by mouth every 6 (six) hours as needed for mild pain or headache.    . carvedilol (COREG) 3.125 MG tablet TAKE 1 TABLET BY MOUTH TWICE DAILY WITH A MEAL. Please keep upcoming appt in July for future refills. Thank you 180  tablet 0  . celecoxib (CELEBREX) 200 MG capsule Take 200 mg by mouth daily as needed (pain).     . cetirizine (ZYRTEC) 10 MG tablet Take 10 mg by mouth daily.    . Dulaglutide (TRULICITY) 1.5 AG/5.3MI SOPN Inject 1.25 mg into the skin every 7 (seven) days. sunday    .  glipiZIDE (GLUCOTROL XL) 10 MG 24 hr tablet Take 10 mg by mouth daily with breakfast.     . isosorbide mononitrate (IMDUR) 30 MG 24 hr tablet TAKE 1 TABLET (30 MG TOTAL) BY MOUTH DAILY. 90 tablet 2  . losartan (COZAAR) 50 MG tablet Take 50 mg by mouth daily.     . Menthol-Methyl Salicylate (MUSCLE RUB EX) Apply 1 application topically as needed (for pain).    . metFORMIN (GLUCOPHAGE-XR) 750 MG 24 hr tablet Take 1,500 mg by mouth every evening.    . Misc Natural Products (TURMERIC CURCUMIN) CAPS Take 2 capsules by mouth daily.     . Multiple Vitamin (MULTIVITAMIN WITH MINERALS) TABS tablet Take 1 tablet by mouth daily.    . nitroGLYCERIN (NITROSTAT) 0.4 MG SL tablet Place 1 tablet (0.4 mg total) under the tongue every 5 (five) minutes as needed for chest pain (Call 911 at 3rd dose within 15 minutes.). 25 tablet 3  . prasugrel (EFFIENT) 10 MG TABS tablet Take 1 tablet (10 mg total) by mouth daily. 90 tablet 2  . ranitidine (ZANTAC) 150 MG tablet Take 150 mg by mouth every evening.     . rosuvastatin (CRESTOR) 40 MG tablet Take 20 mg by mouth See admin instructions. Takes by mouth one half tablet (20 mg ) three times a week.    . sertraline (ZOLOFT) 50 MG tablet Take 25 mg by mouth daily.      No current facility-administered medications for this visit.       Review of Systems:     Cardiac Review of Systems: [Y] = yes  or   [ N ] = no   Chest Pain [ y   ]  Resting SOB [ n] Exertional SOB  [  y]  Orthopnea [  y]   Pedal Edema [ n  ]    Palpitations [ n ] Syncope  [  n]   Presyncope [n   ]   General Review of Systems: [Y] = yes [  ]=no Constitional: recent weight change [  ];  Wt loss over the last 3 months [   ] anorexia [  ];  fatigue [  ]; nausea [  ]; night sweats [  ]; fever [  ]; or chills [  ];           Eye : blurred vision [  ]; diplopia [   ]; vision changes [  ];  Amaurosis fugax[  ]; Resp: cough [  ];  wheezing[  ];  hemoptysis[  ]; shortness of breath[  ]; paroxysmal nocturnal dyspnea[  ]; dyspnea on exertion[  ]; or orthopnea[  ];  GI:  gallstones[  ], vomiting[  ];  dysphagia[  ]; melena[  ];  hematochezia [  ]; heartburn[  ];   Hx of  Colonoscopy[  ]; GU: kidney stones [  ]; hematuria[  ];   dysuria [  ];  nocturia[  ];  history of     obstruction [  ]; urinary frequency [  ]             Skin: rash, swelling[  ];, hair loss[  ];  peripheral edema[  ];  or itching[  ]; Musculosketetal: myalgias[ y ];  joint swelling[y  ];  joint erythema[  ];  joint pain[  ];  back pain[  ];  Heme/Lymph: bruising[  ];  bleeding[  ];  anemia[  ];  Neuro: TIA[  ];  headaches[  ];  stroke[  ];  vertigo[  ];  seizures[  ];   paresthesias[  ];  difficulty walking[  ];  Psych:depression[  ]; anxiety[  ];  Endocrine: diabetes[  ];  thyroid dysfunction[  ];  Immunizations: Flu up to date [ y ]; Pneumococcal up to date Blue.Reese  ];  Other: She had recent dental checkup and recent bridge.   PHYSICAL EXAMINATION: BP (!) 151/86 (BP Location: Left Arm, Patient Position: Sitting, Cuff Size: Large)   Pulse 84   Resp 16   Ht 5' 2.25" (1.581 m)   Wt 213 lb (96.6 kg)   SpO2 95% Comment: ON RA  BMI 38.65 kg/m  General appearance: alert, cooperative, appears stated age and no distress Head: Normocephalic, without obvious abnormality, atraumatic Neck: no adenopathy, no carotid bruit, no JVD, supple, symmetrical, trachea midline and thyroid not enlarged, symmetric, no tenderness/mass/nodules Lymph nodes: Cervical, supraclavicular, and axillary nodes normal. Resp: clear to auscultation bilaterally Back: symmetric, no curvature. ROM normal. No CVA tenderness. Cardio: regular rate and rhythm and systolic murmur: early systolic 3/6, crescendo  throughout the precordium GI: soft, non-tender; bowel sounds normal; no masses,  no organomegaly Extremities: extremities normal, atraumatic, no cyanosis or edema and Homans sign is negative, no sign of DVT Neurologic: Grossly normal  Diagnostic Studies & Laboratory data:     Recent Radiology Findings:   No results found.   I have independently reviewed the above radiology studies  and reviewed the findings with the patient.   Recent Lab Findings: Lab Results  Component Value Date   WBC 9.5 03/29/2018   HGB 13.6 03/29/2018   HCT 38.8 03/29/2018   PLT 225 03/29/2018   GLUCOSE 205 (H) 03/29/2018   CHOL 153 05/11/2017   TRIG 281 (H) 05/11/2017   HDL 29 (L) 05/11/2017   LDLCALC 68 05/11/2017   ALT 23 03/29/2018   AST 24 03/29/2018   NA 140 03/29/2018   K 4.7 03/29/2018   CL 99 03/29/2018   CREATININE 1.13 (H) 03/29/2018   BUN 13 03/29/2018   CO2 24 03/29/2018   INR 1.01 08/06/2016   HGBA1C 7.6 (H) 04/14/2017   CATH: Conclusion    Calcific aortic stenosis, moderately severe with calculated aortic valve area 0.92 cm.  Peak to peak gradient 45 mmHg.  Two-vessel coronary artery disease with 70% mid LAD stenosis and 50% in-stent restenosis in the right coronary within the region of overlapping double layer stent.  Widely patent circumflex artery.  Normal left main.  Normal pulmonary pressures and left ventricular hemodynamics.  Limiting exertional dyspnea secondary to aortic stenosis predominantly with contribution from CAD.  Recommendations:   Aortic valve replacement  LIMA to LAD and SVG to RCA  Patient has an appointment to see Dr. Servando Snare.   ECHO Transthoracic Echocardiography  Patient:    Sarah Stevenson, Sarah Stevenson MR #:       [id number] Study Date: 03/16/2018 Gender:     F Age:        62 Height:     160 cm Weight:     96.2 kg BSA:        2.11 m^2 Pt. Status: Room:   ATTENDING    Belva Crome, MD  Livia Snellen     Belva Crome, MD  REFERRING     Belva Crome, MD  SONOGRAPHER  Oletta Lamas, Will  PERFORMING   [facility], Outpatient  cc:  ------------------------------------------------------------------- LV EF: 60% -   65%  ------------------------------------------------------------------- Indications:      (I35.0).  -------------------------------------------------------------------  History:   PMH:  Acquired from the patient and from the patient&'s chart.  Dyspnea.  Aortic stenosis.  Risk factors:  Former tobacco use. Diabetes mellitus. Dyslipidemia.  ------------------------------------------------------------------- Study Conclusions  - Left ventricle: The cavity size was normal. Wall thickness was   increased in a pattern of mild LVH. Systolic function was normal.   The estimated ejection fraction was in the range of 60% to 65%.   Wall motion was normal; there were no regional wall motion   abnormalities. Doppler parameters are consistent with abnormal   left ventricular relaxation (grade 1 diastolic dysfunction). - Aortic valve: Valve mobility was restricted. There was severe   stenosis. There was trivial regurgitation. - Mitral valve: Calcified annulus. Mildly thickened leaflets .  Impressions:  - Normal LV systolic function; mild diastolic dysfunction; mild   LVH; calcified aortic valve with severe AS (mean gradient 41   mmHg) and trace AI.  ------------------------------------------------------------------- Study data:  Comparison was made to the study of 03/15/2017.  Study status:  Routine.  Procedure:  The patient reported no pain pre or post test. Transthoracic echocardiography for left ventricular function evaluation and for assessment of valvular function. Image quality was adequate.  Study completion:  There were no complications.          Transthoracic echocardiography.  M-mode, complete 2D, spectral Doppler, and color Doppler.  Birthdate: Patient birthdate: 02/27/1956.  Age:  Patient is 62 yr old.   Sex: Gender: female.    BMI: 37.6 kg/m^2.  Blood pressure:     110/70 Patient status:  Outpatient.  Study date:  Study date: 03/16/2018. Study time: 10:49 AM.  Location:  [facility]  -------------------------------------------------------------------  ------------------------------------------------------------------- Left ventricle:  The cavity size was normal. Wall thickness was increased in a pattern of mild LVH. Systolic function was normal. The estimated ejection fraction was in the range of 60% to 65%. Wall motion was normal; there were no regional wall motion abnormalities. Doppler parameters are consistent with abnormal left ventricular relaxation (grade 1 diastolic dysfunction).  ------------------------------------------------------------------- Aortic valve:  Poorly visualized.  Moderately calcified leaflets. Valve mobility was restricted.  Doppler:   There was severe stenosis.   There was trivial regurgitation.    VTI ratio of LVOT to aortic valve: 0.25. Valve area (VTI): 0.88 cm^2. Indexed valve area (VTI): 0.42 cm^2/m^2. Peak velocity ratio of LVOT to aortic valve: 0.23. Valve area (Vmax): 0.79 cm^2. Indexed valve area (Vmax): 0.37 cm^2/m^2. Mean velocity ratio of LVOT to aortic valve: 0.24. Valve area (Vmean): 0.84 cm^2. Indexed valve area (Vmean): 0.4 cm^2/m^2.    Mean gradient (S): 41 mm Hg. Peak gradient (S): 76 mm Hg.  ------------------------------------------------------------------- Aorta:  Aortic root: The aortic root was normal in size.  ------------------------------------------------------------------- Mitral valve:   Calcified annulus. Mildly thickened leaflets . Mobility was not restricted.  Doppler:  Transvalvular velocity was within the normal range. There was no evidence for stenosis. There was trivial regurgitation.    Peak gradient (D): 2 mm Hg.  ------------------------------------------------------------------- Left atrium:  The  atrium was normal in size.  ------------------------------------------------------------------- Right ventricle:  The cavity size was normal. Systolic function was normal.  ------------------------------------------------------------------- Pulmonic valve:    Doppler:  Transvalvular velocity was within the normal range. There was no evidence for stenosis.  ------------------------------------------------------------------- Tricuspid valve:   Structurally normal valve.    Doppler: Transvalvular velocity was within the normal range. There was trivial regurgitation.  ------------------------------------------------------------------- Right atrium:  The atrium was normal in size.  ------------------------------------------------------------------- Pericardium:  There was no pericardial effusion.  ------------------------------------------------------------------- Measurements   Left ventricle                            Value          Reference  LV ID, ED, PLAX chordal                   44.3  mm       43 - 52  LV ID, ES, PLAX chordal                   27.1  mm       23 - 38  LV fx shortening, PLAX chordal            39    %        >=29  LV PW thickness, ED                       13.3  mm       ---------  IVS/LV PW ratio, ED                       0.98           <=1.3  Stroke volume, 2D                         86    ml       ---------  Stroke volume/bsa, 2D                     41    ml/m^2   ---------  LV e&', lateral                            5.46  cm/s     ---------  LV E/e&', lateral                          13.39          ---------  LV e&', medial                             4.87  cm/s     ---------  LV E/e&', medial                           15.01          ---------  LV e&', average                            5.17  cm/s     ---------  LV E/e&', average                          14.15          ---------    Ventricular septum                        Value          Reference   IVS thickness, ED  13    mm       ---------    LVOT                                      Value          Reference  LVOT ID, S                                21    mm       ---------  LVOT area                                 3.46  cm^2     ---------  LVOT ID                                   21    mm       ---------  LVOT peak velocity, S                     99.3  cm/s     ---------  LVOT mean velocity, S                     73    cm/s     ---------  LVOT VTI, S                               24.9  cm       ---------  LVOT peak gradient, S                     4     mm Hg    ---------  Stroke volume (SV), LVOT DP               86.2  ml       ---------  Stroke index (SV/bsa), LVOT DP            40.8  ml/m^2   ---------    Aortic valve                              Value          Reference  Aortic valve peak velocity, S             437   cm/s     ---------  Aortic valve mean velocity, S             300   cm/s     ---------  Aortic valve VTI, S                       98    cm       ---------  Aortic mean gradient, S                   41    mm Hg    ---------  Aortic peak gradient, S                   76  mm Hg    ---------  VTI ratio, LVOT/AV                        0.25           ---------  Aortic valve area, VTI                    0.88  cm^2     ---------  Aortic valve area/bsa, VTI                0.42  cm^2/m^2 ---------  Velocity ratio, peak, LVOT/AV             0.23           ---------  Aortic valve area, peak velocity          0.79  cm^2     ---------  Aortic valve area/bsa, peak               0.37  cm^2/m^2 ---------  velocity  Velocity ratio, mean, LVOT/AV             0.24           ---------  Aortic valve area, mean velocity          0.84  cm^2     ---------  Aortic valve area/bsa, mean               0.4   cm^2/m^2 ---------  velocity    Aorta                                     Value          Reference  Aortic root ID, ED                        34    mm        ---------  Ascending aorta ID, A-P, S                31    mm       ---------    Left atrium                               Value          Reference  LA ID, A-P, ES                            41    mm       ---------  LA ID/bsa, A-P                            1.94  cm/m^2   <=2.2  LA volume, S                              64    ml       ---------  LA volume/bsa, S                          30.3  ml/m^2   ---------  LA volume, ES, 1-p A4C  63    ml       ---------  LA volume/bsa, ES, 1-p A4C                29.8  ml/m^2   ---------  LA volume, ES, 1-p A2C                    59    ml       ---------  LA volume/bsa, ES, 1-p A2C                27.9  ml/m^2   ---------    Mitral valve                              Value          Reference  Mitral E-wave peak velocity               73.1  cm/s     ---------  Mitral A-wave peak velocity               128   cm/s     ---------  Mitral deceleration time          (H)     264   ms       150 - 230  Mitral peak gradient, D                   2     mm Hg    ---------  Mitral E/A ratio, peak                    0.6            ---------    Systemic veins                            Value          Reference  Estimated CVP                             3     mm Hg    ---------    Right ventricle                           Value          Reference  RV s&', lateral, S                         18.4  cm/s     ---------  Legend: (L)  and  (H)  mark values outside specified reference range.  ------------------------------------------------------------------- Prepared and Electronically Authenticated by  Kirk Ruths 2019-07-10T14:58:39   Assessment / Plan:   #1 symptomatic aortic stenosis #2 coronary artery disease, with residual disease in the stented right coronary artery and LAD #3 overall preserved LV function   With the patient's symptomatic aortic stenosis agree with recommendation to proceed with aortic valve replacement and  coronary artery bypass grafting to the LAD and right coronary artery.  Risks and options of surgery were discussed with the patient in detail.  We also discussed use of mechanical versus tissue valves.  With her chronic back pain and joint discomfort she preferred a tissue valve to the mechanical valve requiring long-term Coumadin anticoagulation.  She is currently on Effient which we we will need to hold 1 week prior to surgery.  The patient's would like to proceed on August 12.  I  spent 60 minutes with  the patient face to face and greater then 50% of the time was spent in counseling and coordination of care.    Grace Isaac MD      [address] ,[place] [address] Office [phone]   Beeper [phone]  04/06/2018 5:25 PM

## 2018-04-05 NOTE — Patient Instructions (Addendum)
Stop effient Aug 5 Stop losartin Aug 10 Stop celebrex now     Aortic Valve Replacement Aortic valve replacement is surgical replacement of an aortic valve that cannot be repaired. This procedure may be done to replace:  A narrow aortic valve (aortic valve stenosis).  A deformed aortic valve (bicuspid aortic valve).  An aortic valve that does not close all the way (aortic insufficiency).  The aortic valve is replaced with artificial (prosthetic) valve. Three types of prosthetic valves are available:  Mechanical valves made entirely from man-made materials.  Donor valves from human donors. These are only used in special situations.  Biological valves made from animal tissues.  The type of prosthetic valve used will be determined based on various factors, including your age, your lifestyle, and other medical conditions you have. This procedure is done using an open surgical technique, meaning that a large incision will be made in your chest over your heart. Tell a health care provider about:  Any allergies you have.  All medicines you are taking, including vitamins, herbs, eye drops, creams, and over-the-counter medicines.  Any problems you or family members have had with anesthetic medicines.  Any blood disorders you have.  Any surgeries you have had.  Any medical conditions you have.  Whether you are pregnant or may be pregnant. What are the risks? Generally, this is a safe procedure. However, problems may occur, including:  Infection.  Bleeding.  Allergic reactions to medicines.  Damage to other structures or organs.  Blood clotting caused by the prosthetic valve.  Failure of the prosthetic valve.  What happens before the procedure?  Ask your health care provider about: ? Changing or stopping your regular medicines. This is especially important if you are taking diabetes medicines or blood thinners. ? Taking medicines such as aspirin and ibuprofen. These  medicines can thin your blood. Do not take these medicines before your procedure if your health care provider instructs you not to.  Follow instructions from your health care provider about eating or drinking restrictions.  You may be given antibiotic medicine to help prevent infection.  You may have tests, such as: ? Echocardiogram. ? Electrocardiogram (ECG). ? Blood tests. ? Urine tests.  Plan to have someone take you home when you leave the hospital.  Plan to have someone with you for at least 24 hours after you leave the hospital. What happens during the procedure?  To reduce your risk of infection: ? Your health care team will wash or sanitize their hands. ? Your skin will be washed with soap.  An IV tube will be inserted into one of your veins.  You will be given one or more of the following: ? A medicine to help you relax (sedative). ? A medicine to make you fall asleep (general anesthetic).  You will be placed on a heart-lung bypass machine. This machine provides oxygen to your blood while your heart is undergoing surgery.  An incision will be made in your chest, over your heart.  Your damaged aortic valve will be removed.  A prosthetic valve will be sewn into your heart.  Your incision will be closed with stitches (sutures), skin glue, or adhesive tape.  A bandage (dressing) will be placed over your incision. The procedure may vary among health care providers and hospitals. What happens after the procedure?  Your blood pressure, heart rate, breathing rate, and blood oxygen level will be monitored often until the medicines you were given have worn off.  Do not  drive until your health care provider approves. This information is not intended to replace advice given to you by your health care provider. Make sure you discuss any questions you have with your health care provider. Document Released: 01/13/2005 Document Revised: 01/30/2016 Document Reviewed:  07/28/2015 Elsevier Interactive Patient Education  2017 [employer].   Aortic Valve Replacement, Care After Refer to this sheet in the next few weeks. These instructions provide you with information about caring for yourself after your procedure. Your health care provider may also give you more specific instructions. Your treatment has been planned according to current medical practices, but problems sometimes occur. Call your health care provider if you have any problems or questions after your procedure. What can I expect after the procedure? After the procedure, it is common to have:  Pain around your incision area.  A small amount of blood or clear fluid coming from your incision.  Follow these instructions at home: Eating and drinking   Follow instructions from your health care provider about eating or drinking restrictions. ? Limit alcohol intake to no more than 1 drink per day for nonpregnant women and 2 drinks per day for men. One drink equals 12 oz of beer, 5 oz of wine, or 1 oz of hard liquor. ? Limit how much caffeine you drink. Caffeine can affect your heart's rate and rhythm.  Drink enough fluid to keep your urine clear or pale yellow.  Eat a heart-healthy diet. This should include plenty of fresh fruits and vegetables. If you eat meat, it should be lean cuts. Avoid foods that are: ? High in salt, saturated fat, or sugar. ? Canned or highly processed. ? Fried. Activity  Return to your normal activities as told by your health care provider. Ask your health care provider what activities are safe for you.  Exercise regularly once you have recovered, as told by your health care provider.  Avoid sitting for more than 2 hours at a time without moving. Get up and move around at least once every 1-2 hours. This helps to prevent blood clots in the legs.  Do not lift anything that is heavier than 10 lb (4.5 kg) until your health care provider approves.  Avoid pushing or  pulling things with your arms until your health care provider approves. This includes pulling on handrails to help you climb stairs. Incision care   Follow instructions from your health care provider about how to take care of your incision. Make sure you: ? Wash your hands with soap and water before you change your bandage (dressing). If soap and water are not available, use hand sanitizer. ? Change your dressing as told by your health care provider. ? Leave stitches (sutures), skin glue, or adhesive strips in place. These skin closures may need to stay in place for 2 weeks or longer. If adhesive strip edges start to loosen and curl up, you may trim the loose edges. Do not remove adhesive strips completely unless your health care provider tells you to do that.  Check your incision area every day for signs of infection. Check for: ? More redness, swelling, or pain. ? More fluid or blood. ? Warmth. ? Pus or a bad smell. Medicines  Take over-the-counter and prescription medicines only as told by your health care provider.  If you were prescribed an antibiotic medicine, take it as told by your health care provider. Do not stop taking the antibiotic even if you start to feel better. Travel  Avoid airplane  travel for as long as told by your health care provider.  When you travel, bring a list of your medicines and a record of your medical history with you. Carry your medicines with you. Driving  Ask your health care provider when it is safe for you to drive. Do not drive until your health care provider approves.  Do not drive or operate heavy machinery while taking prescription pain medicine. Lifestyle   Do not use any tobacco products, such as cigarettes, chewing tobacco, or e-cigarettes. If you need help quitting, ask your health care provider.  Resume sexual activity as told by your health care provider. Do not use medicines for erectile dysfunction unless your health care provider  approves, if this applies.  Work with your health care provider to keep your blood pressure and cholesterol under control, and to manage any other heart conditions that you have.  Maintain a healthy weight. General instructions  Do not take baths, swim, or use a hot tub until your health care provider approves.  Do not strain to have a bowel movement.  Avoid crossing your legs while sitting down.  Check your temperature every day for a fever. A fever may be a sign of infection.  If you are a woman and you plan to become pregnant, talk with your health care provider before you become pregnant.  Wear compression stockings if your health care provider instructs you to do this. These stockings help to prevent blood clots and reduce swelling in your legs.  Tell all health care providers who care for you that you have an artificial (prosthetic) aortic valve. If you have or have had heart disease or endocarditis, tell all health care providers about these conditions as well.  Keep all follow-up visits as told by your health care provider. This is important. Contact a health care provider if:  You develop a skin rash.  You experience sudden, unexplained changes in your weight.  You have more redness, swelling, or pain around your incision.  You have more fluid or blood coming from your incision.  Your incision feels warm to the touch.  You have pus or a bad smell coming from your incision.  You have a fever. Get help right away if:  You develop chest pain that is different from the pain coming from your incision.  You develop shortness of breath or difficulty breathing.  You start to feel light-headed. These symptoms may represent a serious problem that is an emergency. Do not wait to see if the symptoms will go away. Get medical help right away. Call your local emergency services (911 in the [place] Do not drive yourself to the hospital. This information is not intended to  replace advice given to you by your health care provider. Make sure you discuss any questions you have with your health care provider. Document Released: 03/12/2005 Document Revised: 01/30/2016 Document Reviewed: 07/28/2015 Elsevier Interactive Patient Education  2017 [employer]  Aortic Valve Stenosis Aortic valve stenosis is a narrowing of the aortic valve. The aortic valve opens and closes to regulate blood flow between the lower left chamber of the heart (left ventricle) and the blood vessel that leads away from the heart (aorta). When the aortic valve becomes narrow, it makes it difficult for the heart to pump blood into the aorta, which causes the heart to work harder. The extra work can weaken the heart over time. Aortic valve stenosis can range from mild to severe. If untreated, it can become more  severe over time and can lead to heart failure. What are the causes? This condition may be caused by:  Buildup of calcium around and on the valve. This can occur with aging. This is the most common cause of aortic valve stenosis.  Birth defect.  Rheumatic fever.  Radiation to the chest.  What increases the risk? You may be more likely to develop this condition if:  You are over the age of 32.  You were born with an abnormal bicuspid valve.  What are the signs or symptoms? You may have no symptoms until your condition becomes severe. It may take 10-20 years for mild or moderate aortic valve stenosis to become severe. Symptoms may include:  Shortness of breath. This may get worse during physical activity.  Feeling unusually weak and tired (fatigue).  Extreme discomfort in the chest, neck, or arm (angina).  A heartbeat that is irregular or faster than normal (palpitations).  Dizziness or fainting. This may happen when you get physically tired or after you take certain heart medicines, such as nitroglycerin.  How is this diagnosed? This condition may be diagnosed with:  A  physical exam.  Echocardiogram. This is a type of imaging test that uses sound waves (ultrasound) to make an image of your heart. There are two types that may be used: ? Transthoracic echocardiogram (TTE). This type of echocardiogram is noninvasive, and it is usually done first. ? Transesophageal echocardiogram (TEE). This type of echocardiogram is done by passing a flexible tube down your esophagus. The heart and the esophagus are close to each other, so your health care provider can take very clear, detailed pictures of the heart using this type of test.  Cardiac catheterization. In this procedure, a thin, flexible tube (catheter) is passed through a large vein in your neck, groin, or arm. This procedure provides information about arteries, structures, blood pressure, and oxygen levels in your heart.  Electrocardiogram (ECG). This records the electrical impulses of your heart and assesses heart function.  Stress tests. These are tests that evaluate the blood supply to your heart and your heart's response to exercise.  Blood tests.  You may work with a health care provider who specializes in the heart (cardiologist). How is this treated? Treatment depends on how severe your condition is and what your symptoms are. You will need to have your heart checked regularly to make sure that your condition is not getting worse or causing serious problems. If your condition is mild, no treatment may be needed. Treatment may include:  Medicines that help keep your heart rate regular.  Medicines that thin your blood (anticoagulants) to prevent the formation of blood clots.  Antibiotic medicines to help prevent infection.  Surgery to replace your aortic valve. This is the most common treatment for aortic valve stenosis. Several types of surgeries are available. The surgery may be done through a large incision over your heart (open heart surgery), or it may be done using a minimally invasive technique  (transcatheter aortic valve replacement, or TAVR).  Follow these instructions at home: Lifestyle   Limit alcohol intake to no more than 1 drink per day for nonpregnant women and 2 drinks per day for men. One drink equals 12 oz of beer, 5 oz of wine, or 1 oz of hard liquor.  Do not use any tobacco products, such as cigarettes, chewing tobacco, or e-cigarettes. If you need help quitting, ask your health care provider.  Work with your health care provider to manage your  blood pressure and cholesterol.  Maintain a healthy weight. Eating and drinking  Follow instructions from your health care provider about eating or drinking restrictions. ? Limit how much caffeine you drink. Caffeine can affect your heart's rate and rhythm.  Drink enough fluid to keep your urine clear or pale yellow.  Eat a heart-healthy diet. This should include plenty of fresh fruits and vegetables. If you eat meat, it should be lean cuts. Avoid foods that are: ? High in salt, saturated fat, or sugar. ? Canned or highly processed. ? Fried. Activity  Return to your normal activities as told by your health care provider. Ask your health care provider what activities are safe for you.  Exercise regularly, as told by your health care provider. Ask your health care provider what types of exercise are safe for you.  If your aortic valve stenosis is mild, you may need to avoid only very intense physical activity. The more severe your aortic valve stenosis is, the more activities you may need to avoid. General instructions  Take over-the-counter and prescription medicines only as told by your health care provider.  If you are a woman and you plan to become pregnant, talk with your health care provider before you become pregnant.  Tell all health care providers who care for you that you have aortic valve stenosis.  Keep all follow-up visits as told by your health care provider. This is important. Contact a health care  provider if:  You have a fever. Get help right away if:  You develop chest pain or tightness.  You develop shortness of breath or difficulty breathing.  You feel light-headed.  You feel like you might faint.  Your heartbeat is irregular or faster than normal. These symptoms may represent a serious problem that is an emergency. Do not wait to see if the symptoms will go away. Get medical help right away. Call your local emergency services (911 in the [place] Do not drive yourself to the hospital. This information is not intended to replace advice given to you by your health care provider. Make sure you discuss any questions you have with your health care provider. Document Released: 05/23/2003 Document Revised: 01/30/2016 Document Reviewed: 07/28/2015 Elsevier Interactive Patient Education  2017 [employer].

## 2018-04-06 ENCOUNTER — Encounter: Payer: Self-pay | Admitting: *Deleted

## 2018-04-06 ENCOUNTER — Other Ambulatory Visit: Payer: Self-pay | Admitting: *Deleted

## 2018-04-06 DIAGNOSIS — I2510 Atherosclerotic heart disease of native coronary artery without angina pectoris: Secondary | ICD-10-CM

## 2018-04-06 DIAGNOSIS — Q231 Congenital insufficiency of aortic valve: Principal | ICD-10-CM

## 2018-04-06 DIAGNOSIS — I350 Nonrheumatic aortic (valve) stenosis: Secondary | ICD-10-CM

## 2018-04-06 DIAGNOSIS — Z01818 Encounter for other preprocedural examination: Secondary | ICD-10-CM

## 2018-04-06 DIAGNOSIS — Q230 Congenital stenosis of aortic valve: Secondary | ICD-10-CM

## 2018-04-07 ENCOUNTER — Telehealth: Payer: Self-pay | Admitting: *Deleted

## 2018-04-07 ENCOUNTER — Encounter: Payer: Self-pay | Admitting: *Deleted

## 2018-04-07 NOTE — Telephone Encounter (Signed)
Patient called today to r/s her surgery with Dr. Servando Snare from 8/12 to 9/10.  She states she needs to build her time up at work and take care of personal things.  Dr. Servando Snare made aware of surgery date change.  She will stop her Effient 1 week prior.  I asked her to call us if there were any changes between now and surgery.  No further questions.

## 2018-04-14 ENCOUNTER — Other Ambulatory Visit (HOSPITAL_COMMUNITY): Payer: 59

## 2018-04-15 ENCOUNTER — Encounter (HOSPITAL_COMMUNITY): Payer: 59

## 2018-04-15 ENCOUNTER — Ambulatory Visit
Admission: RE | Admit: 2018-04-15 | Discharge: 2018-04-15 | Disposition: A | Discharge status: Home / Self Care | Payer: 59 | Source: Ambulatory Visit | Attending: Cardiothoracic Surgery | Admitting: Cardiothoracic Surgery

## 2018-04-15 DIAGNOSIS — Z01818 Encounter for other preprocedural examination: Secondary | ICD-10-CM

## 2018-04-15 DIAGNOSIS — Q230 Congenital stenosis of aortic valve: Secondary | ICD-10-CM

## 2018-04-15 DIAGNOSIS — Q231 Congenital insufficiency of aortic valve: Principal | ICD-10-CM

## 2018-04-15 DIAGNOSIS — I700 Atherosclerosis of aorta: Secondary | ICD-10-CM | POA: Diagnosis not present

## 2018-04-15 MED ORDER — IOPAMIDOL (ISOVUE-370) INJECTION 76%
75.0000 mL | Freq: Once | INTRAVENOUS | Status: AC | PRN
  Administered 2018-04-15: 75 mL via INTRAVENOUS

## 2018-04-27 MED FILL — LOSARTAN POTASSIUM 50 MG TA: 50 | 90 days supply | Qty: 90 | Fill #0

## 2018-04-27 MED FILL — glipiZIDE ER 10 MG TB24: 10 | 90 days supply | Qty: 90 | Fill #2

## 2018-05-11 NOTE — Pre-Procedure Instructions (Signed)
Sarah Stevenson  05/11/2018      [place], [place] [address] Phone: [phone] Fax: [phone]  [id number] - [place], [address] [place] [address] Phone: [phone] Fax: [phone]    Your procedure is scheduled on Tuesday September 10th.  Report to [facility] Admitting at [place].M.  Call this number if you have problems the morning of surgery:  [phone]   Remember:  Do not eat or drink after midnight.      Take these medicines the morning of surgery with A SIP OF WATER    Tylenol (if needed)  Carvedilol  Zyrtec   Flonase (if needed)  Isosorbide (Imdur)  Eye drops (If needed)  Zoloft   7 days prior to surgery STOP taking any Aspirin(unless otherwise instructed by your surgeon), Aleve, Naproxen, Ibuprofen, Motrin, Advil, Goody's, BC's, all herbal medications, fish oil, and all vitamins. Stop taking Celebrex   WHAT DO I DO ABOUT MY DIABETES MEDICATION?   Marland Kitchen Do not take oral diabetes medicines (pills) the morning of surgery. Glipizide and Metformin  . The day of surgery, do not take other diabetes injectables, including Byetta (exenatide), Bydureon (exenatide ER), Victoza (liraglutide), or Trulicity (dulaglutide).    How to Manage Your Diabetes Before and After Surgery  Why is it important to control my blood sugar before and after surgery? . Improving blood sugar levels before and after surgery helps healing and can limit problems. . A way of improving blood sugar control is eating a healthy diet by: o  Eating less sugar and carbohydrates o  Increasing activity/exercise o  Talking with your doctor about reaching your blood sugar goals . High blood sugars (greater than 180 mg/dL) can raise your risk of infections and slow your recovery, so you will need to focus on  controlling your diabetes during the weeks before surgery. . Make sure that the doctor who takes care of your diabetes knows about your planned surgery including the date and location.  How do I manage my blood sugar before surgery? . Check your blood sugar at least 4 times a day, starting 2 days before surgery, to make sure that the level is not too high or low. o Check your blood sugar the morning of your surgery when you wake up and every 2 hours until you get to the Short Stay unit. . If your blood sugar is less than 70 mg/dL, you will need to treat for low blood sugar: o Do not take insulin. o Treat a low blood sugar (less than 70 mg/dL) with  cup of clear juice (cranberry or apple), 4 glucose tablets, OR glucose gel. o Recheck blood sugar in 15 minutes after treatment (to make sure it is greater than 70 mg/dL). If your blood sugar is not greater than 70 mg/dL on recheck, call [phone] for further instructions. . Report your blood sugar to the short stay nurse when you get to Short Stay.  . If you are admitted to the hospital after surgery: o Your blood sugar will be checked by the staff and you will probably be given insulin after surgery (instead of oral diabetes medicines) to make sure you have good blood sugar levels. o The goal for blood sugar control after surgery is 80-180 mg/dL.      Do  not wear jewelry, make-up or nail polish.  Do not wear lotions, powders, or perfumes, or deodorant.  Do not shave 48 hours prior to surgery.  Men may shave face and neck.  Do not bring valuables to the hospital.  [facility] is not responsible for any belongings or valuables.  Contacts, dentures or bridgework may not be worn into surgery.  Leave your suitcase in the car.  After surgery it may be brought to your room.  For patients admitted to the hospital, discharge time will be determined by your treatment team.  Patients discharged the day of surgery will not be allowed to drive home.     [facility]- Preparing For Surgery  Before surgery, you can play an important role. Because skin is not sterile, your skin needs to be as free of germs as possible. You can reduce the number of germs on your skin by washing with CHG (chlorahexidine gluconate) Soap before surgery.  CHG is an antiseptic cleaner which kills germs and bonds with the skin to continue killing germs even after washing.    Oral Hygiene is also important to reduce your risk of infection.  Remember - BRUSH YOUR TEETH THE MORNING OF SURGERY WITH YOUR REGULAR TOOTHPASTE  Please do not use if you have an allergy to CHG or antibacterial soaps. If your skin becomes reddened/irritated stop using the CHG.  Do not shave (including legs and underarms) for at least 48 hours prior to first CHG shower. It is OK to shave your face.  Please follow these instructions carefully.   1. Shower the NIGHT BEFORE SURGERY and the MORNING OF SURGERY with CHG.   2. If you chose to wash your hair, wash your hair first as usual with your normal shampoo.  3. After you shampoo, rinse your hair and body thoroughly to remove the shampoo.  4. Use CHG as you would any other liquid soap. You can apply CHG directly to the skin and wash gently with a scrungie or a clean washcloth.   5. Apply the CHG Soap to your body ONLY FROM THE NECK DOWN.  Do not use on open wounds or open sores. Avoid contact with your eyes, ears, mouth and genitals (private parts). Wash Face and genitals (private parts)  with your normal soap.  6. Wash thoroughly, paying special attention to the area where your surgery will be performed.  7. Thoroughly rinse your body with warm water from the neck down.  8. DO NOT shower/wash with your normal soap after using and rinsing off the CHG Soap.  9. Pat yourself dry with a CLEAN TOWEL.  10. Wear CLEAN PAJAMAS to bed the night before surgery, wear comfortable clothes the morning of surgery  11. Place CLEAN SHEETS on your bed  the night of your first shower and DO NOT SLEEP WITH PETS.    Day of Surgery:  Do not apply any deodorants/lotions.  Please wear clean clothes to the hospital/surgery center.   Remember to brush your teeth WITH YOUR REGULAR TOOTHPASTE.    Please read over the following fact sheets that you were given.

## 2018-05-12 ENCOUNTER — Ambulatory Visit (HOSPITAL_COMMUNITY)
Admission: RE | Admit: 2018-05-12 | Discharge: 2018-05-12 | Disposition: A | Discharge status: Home / Self Care | Payer: 59 | Source: Ambulatory Visit | Attending: Cardiothoracic Surgery | Admitting: Cardiothoracic Surgery

## 2018-05-12 ENCOUNTER — Encounter (HOSPITAL_COMMUNITY): Payer: Self-pay

## 2018-05-12 ENCOUNTER — Ambulatory Visit (HOSPITAL_BASED_OUTPATIENT_CLINIC_OR_DEPARTMENT_OTHER)
Admission: RE | Admit: 2018-05-12 | Discharge: 2018-05-12 | Disposition: A | Discharge status: Home / Self Care | Payer: 59 | Source: Ambulatory Visit | Attending: Cardiothoracic Surgery | Admitting: Cardiothoracic Surgery

## 2018-05-12 ENCOUNTER — Encounter (HOSPITAL_COMMUNITY)
Admission: RE | Admit: 2018-05-12 | Discharge: 2018-05-12 | Disposition: A | Discharge status: Home / Self Care | Payer: 59 | Source: Ambulatory Visit | Attending: Cardiothoracic Surgery | Admitting: Cardiothoracic Surgery

## 2018-05-12 DIAGNOSIS — I350 Nonrheumatic aortic (valve) stenosis: Secondary | ICD-10-CM | POA: Diagnosis not present

## 2018-05-12 DIAGNOSIS — E119 Type 2 diabetes mellitus without complications: Secondary | ICD-10-CM | POA: Diagnosis not present

## 2018-05-12 DIAGNOSIS — I2510 Atherosclerotic heart disease of native coronary artery without angina pectoris: Secondary | ICD-10-CM

## 2018-05-12 DIAGNOSIS — Z01812 Encounter for preprocedural laboratory examination: Secondary | ICD-10-CM | POA: Insufficient documentation

## 2018-05-12 DIAGNOSIS — I6523 Occlusion and stenosis of bilateral carotid arteries: Secondary | ICD-10-CM | POA: Insufficient documentation

## 2018-05-12 DIAGNOSIS — Z01810 Encounter for preprocedural cardiovascular examination: Secondary | ICD-10-CM | POA: Insufficient documentation

## 2018-05-12 DIAGNOSIS — F17210 Nicotine dependence, cigarettes, uncomplicated: Secondary | ICD-10-CM | POA: Diagnosis not present

## 2018-05-12 DIAGNOSIS — Z01818 Encounter for other preprocedural examination: Secondary | ICD-10-CM | POA: Diagnosis not present

## 2018-05-12 HISTORY — DX: Dyspnea, unspecified: R06.00

## 2018-05-12 HISTORY — DX: Nonrheumatic aortic (valve) stenosis: I35.0

## 2018-05-12 LAB — BLOOD GAS, ARTERIAL
Acid-Base Excess: 0.7 mmol/L (ref 0.0–2.0)
Bicarbonate: 24.6 mmol/L (ref 20.0–28.0)
Drawn by: 421801
FIO2: 21
O2 Saturation: 96.3 %
Patient temperature: 98.6
pCO2 arterial: 37.7 mmHg (ref 32.0–48.0)
pH, Arterial: 7.43 (ref 7.350–7.450)
pO2, Arterial: 82.5 mmHg — ABNORMAL LOW (ref 83.0–108.0)

## 2018-05-12 LAB — GLUCOSE, CAPILLARY
Glucose-Capillary: 119 mg/dL — ABNORMAL HIGH (ref 70–99)
Glucose-Capillary: 182 mg/dL — ABNORMAL HIGH (ref 70–99)

## 2018-05-12 LAB — CBC
HCT: 37.5 % (ref 36.0–46.0)
Hemoglobin: 12 g/dL (ref 12.0–15.0)
MCH: 30.4 pg (ref 26.0–34.0)
MCHC: 32 g/dL (ref 30.0–36.0)
MCV: 94.9 fL (ref 78.0–100.0)
Platelets: 191 10*3/uL (ref 150–400)
RBC: 3.95 MIL/uL (ref 3.87–5.11)
RDW: 12.3 % (ref 11.5–15.5)
WBC: 6.6 10*3/uL (ref 4.0–10.5)

## 2018-05-12 LAB — PULMONARY FUNCTION TEST
DL/VA % pred: 94 %
DL/VA: 4.3 ml/min/mmHg/L
DLCO unc % pred: 78 %
DLCO unc: 16.95 ml/min/mmHg
FEF 25-75 Post: 2.46 L/sec
FEF 25-75 Pre: 2.28 L/sec
FEF2575-%Change-Post: 7 %
FEF2575-%Pred-Post: 114 %
FEF2575-%Pred-Pre: 106 %
FEV1-%Change-Post: 2 %
FEV1-%Pred-Post: 89 %
FEV1-%Pred-Pre: 87 %
FEV1-Post: 2.07 L
FEV1-Pre: 2.02 L
FEV1FVC-%Change-Post: 1 %
FEV1FVC-%Pred-Pre: 104 %
FEV6-%Change-Post: 0 %
FEV6-%Pred-Post: 86 %
FEV6-%Pred-Pre: 85 %
FEV6-Post: 2.49 L
FEV6-Pre: 2.47 L
FEV6FVC-%Change-Post: 0 %
FEV6FVC-%Pred-Post: 103 %
FEV6FVC-%Pred-Pre: 104 %
FVC-%Change-Post: 1 %
FVC-%Pred-Post: 83 %
FVC-%Pred-Pre: 82 %
FVC-Post: 2.5 L
FVC-Pre: 2.47 L
Post FEV1/FVC ratio: 83 %
Post FEV6/FVC ratio: 100 %
Pre FEV1/FVC ratio: 82 %
Pre FEV6/FVC Ratio: 100 %
RV % pred: 99 %
RV: 1.93 L
TLC % pred: 94 %
TLC: 4.48 L

## 2018-05-12 LAB — SURGICAL PCR SCREEN
MRSA, PCR: NEGATIVE
Staphylococcus aureus: NEGATIVE

## 2018-05-12 LAB — COMPREHENSIVE METABOLIC PANEL
ALT: 19 U/L (ref 0–44)
AST: 17 U/L (ref 15–41)
Albumin: 3.7 g/dL (ref 3.5–5.0)
Alkaline Phosphatase: 93 U/L (ref 38–126)
Anion gap: 14 (ref 5–15)
BUN: 17 mg/dL (ref 8–23)
CO2: 21 mmol/L — ABNORMAL LOW (ref 22–32)
Calcium: 9.2 mg/dL (ref 8.9–10.3)
Chloride: 105 mmol/L (ref 98–111)
Creatinine, Ser: 0.94 mg/dL (ref 0.44–1.00)
GFR calc Af Amer: >60 mL/min (ref >60)
GFR calc non Af Amer: >60 mL/min (ref >60)
Glucose, Bld: 162 mg/dL — ABNORMAL HIGH (ref 70–99)
Potassium: 4.2 mmol/L (ref 3.5–5.1)
Sodium: 140 mmol/L (ref 135–145)
Total Bilirubin: 0.7 mg/dL (ref 0.3–1.2)
Total Protein: 6.8 g/dL (ref 6.5–8.1)

## 2018-05-12 LAB — URINALYSIS, ROUTINE W REFLEX MICROSCOPIC
Bilirubin Urine: NEGATIVE
Glucose, UA: NEGATIVE mg/dL
Hgb urine dipstick: NEGATIVE
Ketones, ur: NEGATIVE mg/dL
Nitrite: POSITIVE — AB
Protein, ur: NEGATIVE mg/dL
Specific Gravity, Urine: 1.013 (ref 1.005–1.030)
WBC, UA: >50 WBC/hpf — ABNORMAL HIGH (ref 0–5)
pH: 5 (ref 5.0–8.0)

## 2018-05-12 LAB — HEMOGLOBIN A1C
Hgb A1c MFr Bld: 9.3 % — ABNORMAL HIGH (ref 4.8–5.6)
Mean Plasma Glucose: 220.21 mg/dL

## 2018-05-12 LAB — ABO/RH: ABO/RH(D): B POS

## 2018-05-12 LAB — PROTIME-INR
INR: 1.08
Prothrombin Time: 14 seconds (ref 11.4–15.2)

## 2018-05-12 LAB — APTT: aPTT: 30 seconds (ref 24–36)

## 2018-05-12 MED ORDER — ALBUTEROL SULFATE (2.5 MG/3ML) 0.083% IN NEBU
2.5000 mg | INHALATION_SOLUTION | Freq: Once | RESPIRATORY_TRACT | Status: AC
  Administered 2018-05-12: 2.5 mg via RESPIRATORY_TRACT

## 2018-05-12 NOTE — Progress Notes (Signed)
Pre-op Cardiac Surgery  Carotid Findings:  1-39% stenosis bilateral ICAs. Bilateral vertebral arteries patent with antegrade flow.   Upper Extremity Right Left  Brachial Pressures 125 130  Radial Waveforms Biphasic Biphasic  Ulnar Waveforms Biphasic Biphasic  Palmar Arch (Allen's Test) See below See below   Findings:                       Right Upper Extremity: Doppler waveforms decrease 50% with right radial compression. Doppler waveforms remain within normal limits with right ulnar compression.                      Left Upper Extremity: Doppler waveforms decrease <50% w left radial compression. Doppler waveforms remain within normal limits with left ulnar compression.    Lower  Extremity Right Left  Dorsalis Pedis Triphasic Triphasic  Posterior Tibial Triphasic Triphasic  Ankle/Brachial Indices    1.24 1.38    Findings:                       Right ABI: Resting right ankle-brachial index is within normal range. No evidence of significant right lower extremity arterial disease.                      Left ABI: Resting left ankle-brachial index indicates noncompressible left lower extremity arteries.   Sarah Stevenson (RDMS RVT) 05/12/18 12:01 PM

## 2018-05-13 ENCOUNTER — Other Ambulatory Visit: Payer: Self-pay | Admitting: *Deleted

## 2018-05-13 ENCOUNTER — Telehealth: Payer: Self-pay

## 2018-05-13 DIAGNOSIS — T82855A Stenosis of coronary artery stent, initial encounter: Secondary | ICD-10-CM | POA: Diagnosis not present

## 2018-05-13 DIAGNOSIS — J9811 Atelectasis: Secondary | ICD-10-CM | POA: Diagnosis not present

## 2018-05-13 DIAGNOSIS — I350 Nonrheumatic aortic (valve) stenosis: Secondary | ICD-10-CM | POA: Diagnosis not present

## 2018-05-13 DIAGNOSIS — N390 Urinary tract infection, site not specified: Secondary | ICD-10-CM | POA: Diagnosis not present

## 2018-05-13 DIAGNOSIS — C3412 Malignant neoplasm of upper lobe, left bronchus or lung: Secondary | ICD-10-CM | POA: Diagnosis not present

## 2018-05-13 DIAGNOSIS — D6959 Other secondary thrombocytopenia: Secondary | ICD-10-CM | POA: Diagnosis not present

## 2018-05-13 DIAGNOSIS — I2510 Atherosclerotic heart disease of native coronary artery without angina pectoris: Secondary | ICD-10-CM

## 2018-05-13 DIAGNOSIS — D62 Acute posthemorrhagic anemia: Secondary | ICD-10-CM | POA: Diagnosis not present

## 2018-05-13 DIAGNOSIS — N3000 Acute cystitis without hematuria: Secondary | ICD-10-CM

## 2018-05-13 DIAGNOSIS — E8881 Metabolic syndrome: Secondary | ICD-10-CM | POA: Diagnosis not present

## 2018-05-13 DIAGNOSIS — Z6838 Body mass index (BMI) 38.0-38.9, adult: Secondary | ICD-10-CM | POA: Diagnosis not present

## 2018-05-13 MED ORDER — CIPROFLOXACIN HCL 500 MG PO TABS: 500.0000 mg | ORAL_TABLET | Freq: Two times a day (BID) | ORAL | 0 refills | Status: DC

## 2018-05-13 MED FILL — CIPROFLOXACIN HCL 500 MG TA: 500 | 3 days supply | Qty: 6 | Fill #0

## 2018-05-13 NOTE — Telephone Encounter (Signed)
RX for Cipro 500 mg BID x3 days called to pharm. Patient is aware

## 2018-05-13 NOTE — Progress Notes (Addendum)
Anesthesia Chart Review:  Case:  [id number] Date/Time:  05/17/18 0715   Procedures:      CORONARY ARTERY BYPASS GRAFTING (CABG) (N/A Chest)     AORTIC VALVE REPLACEMENT (AVR) (N/A )     TRANSESOPHAGEAL ECHOCARDIOGRAM (TEE) (N/A )   Anesthesia type:  General   Pre-op diagnosis:      CAD     AS   Location:  [facility] OR ROOM 14 / [place] [facility]   Surgeon:  Grace Isaac, MD      DISCUSSION: Patient is a 62 year old female scheduled for the above procedure. History includes CAD (angina/NSTEMI, s/p DES RCA 11/30/14, DES RCA 08/06/16), murmur/severe aortic stenosis, former smoker (quit '82), HLD, DM2, dyspnea.  Her DM is not well controlled with A1c of 9.3. Her UA shows large leukocytes and positive nitrites concerning for UTI. I have sent a message to [employer] regarding these results. Defer treatment recommendations to surgeon. I will enter order of DM RN coordinator consult to see post-operatively.   If surgeon feels labs are acceptable for surgery and otherwise no acute changes then I anticipate that she can proceed as planned. (UPDATE 05/16/18 9:10 AM: Urine culture showed Citrobacter Koseri, sensitive to Cipro which was prescribed 05/13/18.)   VS: BP 134/81   Pulse 80   Temp 36.7 C   Resp 20   Ht 5' 2.5" (1.588 m)   Wt 96 kg   SpO2 97%   BMI 38.09 kg/m   PROVIDERS: Christain [place], MD is PCP ([place]) Daneen Schick, MD is cardiologist  LABS: Preoperative labs noted. A1c is 9.3. UA appears positive for UTI with large leukocytes, positive nitrites. (all labs ordered are listed, but only abnormal results are displayed)  Labs Reviewed  GLUCOSE, CAPILLARY - Abnormal; Notable for the following components:      Result Value   Glucose-Capillary 182 (*)    All other components within normal limits  GLUCOSE, CAPILLARY - Abnormal; Notable for the following components:   Glucose-Capillary 119 (*)    All other components within normal limits  BLOOD GAS, ARTERIAL - Abnormal; Notable for  the following components:   pO2, Arterial 82.5 (*)    All other components within normal limits  COMPREHENSIVE METABOLIC PANEL - Abnormal; Notable for the following components:   CO2 21 (*)    Glucose, Bld 162 (*)    All other components within normal limits  HEMOGLOBIN A1C - Abnormal; Notable for the following components:   Hgb A1c MFr Bld 9.3 (*)    All other components within normal limits  URINALYSIS, ROUTINE W REFLEX MICROSCOPIC - Abnormal; Notable for the following components:   APPearance HAZY (*)    Nitrite POSITIVE (*)    Leukocytes, UA LARGE (*)    WBC, UA >50 (*)    Bacteria, UA FEW (*)    All other components within normal limits  SURGICAL PCR SCREEN  APTT  CBC  PROTIME-INR  TYPE AND SCREEN  ABO/RH    PFTs 05/12/18: FVC 2.47 (82%), FEV1 2.02 (87%), DLCO unc 16.95 (78%).   IMAGES: CXR 05/12/18: IMPRESSION: No acute abnormalities.  CTA chest 04/15/18: IMPRESSION: 1. No evidence of thoracic aortic aneurysm. 2. Exuberant calcifications within the aortic leaflets compatible with provided history of aortic stenosis. 3. Cardiomegaly. 4. Coronary artery calcifications, post coronary stent placement. Aortic Atherosclerosis (ICD10-I70.0). 5. **An incidental finding of potential clinical significance has been found. Approximately 1.5 cm ground-glass nodular opacity within the left upper lobe. Correlation with  prior outside examinations (if available) is advised. Otherwise, initial follow-up with CT at 6-12 months is recommended to confirm persistence. If persistent, repeat CT is recommended every 2 years until 5 years of stability has been established. This recommendation follows the consensus statement: Guidelines for Management of Incidental Pulmonary Nodules Detected on CT Images: From the Fleischner Society 2017; Radiology 2017; 284:228-243.**   EKG: 05/12/18: NSR.   CV: Carotid U/S 05/12/18: Final Interpretation: - Right Carotid: Velocities in the right ICA  are consistent with a 1-39% stenosis. - Left Carotid: Velocities in the left ICA are consistent with a 1-39% stenosis. - Vertebrals: Bilateral vertebral arteries demonstrate antegrade flow.  Cardiac cath 04/01/18:  Calcific aortic stenosis, moderately severe with calculated aortic valve area 0.92 cm.  Peak to peak gradient 45 mmHg.  Two-vessel coronary artery disease with 70% mid LAD stenosis and 50% in-stent restenosis in the right coronary within the region of overlapping double layer stent.  Widely patent circumflex artery.  Normal left main.  Normal pulmonary pressures and left ventricular hemodynamics.  Limiting exertional dyspnea secondary to aortic stenosis predominantly with contribution from CAD. Recommendations:  Aortic valve replacement  LIMA to LAD and SVG to RCA  Patient has an appointment to see Dr. Servando Snare.  Echo 03/16/18: Study Conclusions: - Left ventricle: The cavity size was normal. Wall thickness was   increased in a pattern of mild LVH. Systolic function was normal.   The estimated ejection fraction was in the range of 60% to 65%.   Wall motion was normal; there were no regional wall motion   abnormalities. Doppler parameters are consistent with abnormal   left ventricular relaxation (grade 1 diastolic dysfunction). - Aortic valve: Valve mobility was restricted. There was severe   stenosis. There was trivial regurgitation. - Mitral valve: Calcified annulus. Mildly thickened leaflets . Impressions: - Normal LV systolic function; mild diastolic dysfunction; mild   LVH; calcified aortic valve with severe AS (mean gradient 41   mmHg) and trace AI.   Past Medical History:  Diagnosis Date  . Anginal pain ([place])   . Aortic stenosis   . Coronary artery disease   . Depression    "mild" (08/06/2016)  . Dyspnea   . Hyperlipidemia   . Murmur, cardiac   . Myocardial infarction ([place]) 2016  . Osteoarthritis    "left hip and knee" (08/06/2016)  . Type II  diabetes mellitus ([place])     Past Surgical History:  Procedure Laterality Date  . CARDIAC CATHETERIZATION Right 11/30/2014   Procedure: RIGHT/LEFT HEART CATH AND CORONARY ANGIOGRAPHY;  Surgeon: Belva Crome, MD;  Location: [facility] C[facility] LAB;  Service: Cardiovascular;  Laterality: Right;  . CARDIAC CATHETERIZATION N/A 12/13/2015   Procedure: Left Heart Cath and Coronary Angiography;  Surgeon: Leonie Man, MD;  Location: [place] CV LAB;  Service: Cardiovascular;  Laterality: N/A;  . CARDIAC CATHETERIZATION N/A 12/13/2015   Procedure: Intravascular Pressure Wire/FFR Study;  Surgeon: Leonie Man, MD;  Location: [place] CV LAB;  Service: Cardiovascular;  Laterality: N/A;  RCA  . CARDIAC CATHETERIZATION N/A 08/06/2016   Procedure: Right/Left Heart Cath and Coronary Angiography;  Surgeon: Lorretta Harp, MD;  Location: [place] CV LAB;  Service: Cardiovascular;  Laterality: N/A;  . CARDIAC CATHETERIZATION N/A 08/06/2016   Procedure: Coronary Stent Intervention;  Surgeon: Lorretta Harp, MD;  Location: [place] CV LAB;  Service: Cardiovascular;  Laterality: N/A;   distal rca 3.0x16 and 3.0x8 synergy  . CESAREAN SECTION  1989  .  CORONARY ANGIOPLASTY WITH STENT PLACEMENT  08/06/2016   "2 stents"  . HYSTEROSCOPY DIAGNOSTIC  1990s  . JOINT REPLACEMENT    . KNEE ARTHROSCOPY Left 1996  . NASAL SINUS SURGERY  1977  . RIGHT/LEFT HEART CATH AND CORONARY ANGIOGRAPHY N/A 04/01/2018   Procedure: RIGHT/LEFT HEART CATH AND CORONARY ANGIOGRAPHY;  Surgeon: Belva Crome, MD;  Location: [place] CV LAB;  Service: Cardiovascular;  Laterality: N/A;  . TOTAL HIP ARTHROPLASTY Left 04/23/2017   Procedure: LEFT TOTAL HIP ARTHROPLASTY ANTERIOR APPROACH;  Surgeon: Mcarthur Rossetti, MD;  Location: [facility] ORS;  Service: Orthopedics;  Laterality: Left;    MEDICATIONS: . acetaminophen (TYLENOL) 500 MG tablet  . carvedilol (COREG) 3.125 MG tablet  . celecoxib (CELEBREX) 200 MG capsule  . cetirizine  (ZYRTEC) 10 MG tablet  . Cyanocobalamin (B-12 PO)  . Dulaglutide (TRULICITY) 7.12 JW/9.0RM SOPN  . fluticasone (FLONASE) 50 MCG/ACT nasal spray  . glipiZIDE (GLUCOTROL XL) 10 MG 24 hr tablet  . isosorbide mononitrate (IMDUR) 30 MG 24 hr tablet  . losartan (COZAAR) 50 MG tablet  . Menthol-Methyl Salicylate (MUSCLE RUB EX)  . metFORMIN (GLUCOPHAGE-XR) 750 MG 24 hr tablet  . Misc Natural Products (TURMERIC CURCUMIN) CAPS  . nitroGLYCERIN (NITROSTAT) 0.4 MG SL tablet  . Polyvinyl Alcohol-Povidone (CLEAR EYES NATURAL TEARS OP)  . prasugrel (EFFIENT) 10 MG TABS tablet  . ranitidine (ZANTAC) 150 MG tablet  . rosuvastatin (CRESTOR) 40 MG tablet  . sertraline (ZOLOFT) 50 MG tablet   No current facility-administered medications for this encounter.   Effient is documented as being on hold for procedure. (Dr. Servando Snare instructed her to hold for one week prior to surgery.)   George Hugh [facility] Short Stay Center/Anesthesiology Phone [phone] 05/13/2018 1:49 PM

## 2018-05-13 NOTE — Progress Notes (Signed)
ERRONEOUS ENCOUNTER

## 2018-05-15 LAB — URINE CULTURE
Culture: >=100000 — AB
Special Requests: NORMAL

## 2018-05-16 MED ORDER — PLASMA-LYTE 148 IV SOLN
INTRAVENOUS | Status: AC
  Administered 2018-05-17: 500 mL
  Filled 2018-05-16: qty 2.5

## 2018-05-16 MED ORDER — POTASSIUM CHLORIDE 2 MEQ/ML IV SOLN
80.0000 meq | INTRAVENOUS | Status: DC
  Filled 2018-05-16: qty 40

## 2018-05-16 MED ORDER — EPINEPHRINE PF 1 MG/ML IJ SOLN
0.0000 ug/min | INTRAVENOUS | Status: DC
  Filled 2018-05-16: qty 4

## 2018-05-16 MED ORDER — DOPAMINE-DEXTROSE 3.2-5 MG/ML-% IV SOLN
0.0000 ug/kg/min | INTRAVENOUS | Status: DC
  Filled 2018-05-16: qty 250

## 2018-05-16 MED ORDER — VANCOMYCIN HCL 10 G IV SOLR
1500.0000 mg | INTRAVENOUS | Status: AC
  Administered 2018-05-17: 1250 mg via INTRAVENOUS
  Filled 2018-05-16: qty 1500

## 2018-05-16 MED ORDER — TRANEXAMIC ACID (OHS) PUMP PRIME SOLUTION
2.0000 mg/kg | INTRAVENOUS | Status: DC
  Filled 2018-05-16: qty 1.92

## 2018-05-16 MED ORDER — MILRINONE LACTATE IN DEXTROSE 20-5 MG/100ML-% IV SOLN
0.3000 ug/kg/min | INTRAVENOUS | Status: DC
  Filled 2018-05-16: qty 100

## 2018-05-16 MED ORDER — SODIUM CHLORIDE 0.9 % IV SOLN
1.5000 g | INTRAVENOUS | Status: AC
  Administered 2018-05-17: 1.5 g via INTRAVENOUS
  Filled 2018-05-16: qty 1.5

## 2018-05-16 MED ORDER — SODIUM CHLORIDE 0.9 % IV SOLN
30.0000 ug/min | INTRAVENOUS | Status: AC
  Administered 2018-05-17: 40 ug/min via INTRAVENOUS
  Filled 2018-05-16: qty 2

## 2018-05-16 MED ORDER — TRANEXAMIC ACID (OHS) BOLUS VIA INFUSION
15.0000 mg/kg | INTRAVENOUS | Status: AC
  Administered 2018-05-17: 1440 mg via INTRAVENOUS
  Filled 2018-05-16: qty 1440

## 2018-05-16 MED ORDER — TRANEXAMIC ACID 1000 MG/10ML IV SOLN
1.5000 mg/kg/h | INTRAVENOUS | Status: DC
  Filled 2018-05-16: qty 25

## 2018-05-16 MED ORDER — DEXMEDETOMIDINE HCL IN NACL 400 MCG/100ML IV SOLN
0.1000 ug/kg/h | INTRAVENOUS | Status: DC
  Filled 2018-05-16: qty 100

## 2018-05-16 MED ORDER — SODIUM CHLORIDE 0.9 % IV SOLN
INTRAVENOUS | Status: DC
  Filled 2018-05-16: qty 30

## 2018-05-16 MED ORDER — NITROGLYCERIN IN D5W 200-5 MCG/ML-% IV SOLN
2.0000 ug/min | INTRAVENOUS | Status: DC
  Filled 2018-05-16: qty 250

## 2018-05-16 MED ORDER — SODIUM CHLORIDE 0.9 % IV SOLN
750.0000 mg | INTRAVENOUS | Status: AC
  Administered 2018-05-17: 750 mg via INTRAVENOUS
  Filled 2018-05-16: qty 750

## 2018-05-16 MED ORDER — SODIUM CHLORIDE 0.9 % IV SOLN
INTRAVENOUS | Status: DC
  Filled 2018-05-16: qty 1

## 2018-05-16 MED ORDER — MAGNESIUM SULFATE 50 % IJ SOLN
40.0000 meq | INTRAMUSCULAR | Status: DC
  Filled 2018-05-16: qty 9.85

## 2018-05-17 ENCOUNTER — Inpatient Hospital Stay (HOSPITAL_COMMUNITY): Payer: 59

## 2018-05-17 ENCOUNTER — Other Ambulatory Visit: Payer: Self-pay

## 2018-05-17 ENCOUNTER — Encounter (HOSPITAL_COMMUNITY): Payer: Self-pay | Admitting: General Practice

## 2018-05-17 ENCOUNTER — Inpatient Hospital Stay (HOSPITAL_COMMUNITY)
Admission: RE | Disposition: A | Discharge status: Home / Self Care | Payer: Self-pay | Source: Ambulatory Visit | Attending: Cardiothoracic Surgery

## 2018-05-17 ENCOUNTER — Inpatient Hospital Stay (HOSPITAL_COMMUNITY)
Admission: RE | Admit: 2018-05-17 | Discharge: 2018-05-24 | DRG: 220 | Disposition: A | Discharge status: Home / Self Care | Payer: 59 | Source: Ambulatory Visit | Attending: Cardiothoracic Surgery | Admitting: Cardiothoracic Surgery

## 2018-05-17 ENCOUNTER — Inpatient Hospital Stay (HOSPITAL_COMMUNITY): Payer: 59 | Admitting: Vascular Surgery

## 2018-05-17 DIAGNOSIS — I080 Rheumatic disorders of both mitral and aortic valves: Secondary | ICD-10-CM | POA: Diagnosis not present

## 2018-05-17 DIAGNOSIS — Z9689 Presence of other specified functional implants: Secondary | ICD-10-CM

## 2018-05-17 DIAGNOSIS — Y712 Prosthetic and other implants, materials and accessory cardiovascular devices associated with adverse incidents: Secondary | ICD-10-CM | POA: Diagnosis present

## 2018-05-17 DIAGNOSIS — G8929 Other chronic pain: Secondary | ICD-10-CM | POA: Diagnosis present

## 2018-05-17 DIAGNOSIS — Z888 Allergy status to other drugs, medicaments and biological substances status: Secondary | ICD-10-CM

## 2018-05-17 DIAGNOSIS — I10 Essential (primary) hypertension: Secondary | ICD-10-CM | POA: Diagnosis present

## 2018-05-17 DIAGNOSIS — E8881 Metabolic syndrome: Secondary | ICD-10-CM | POA: Diagnosis present

## 2018-05-17 DIAGNOSIS — E8770 Fluid overload, unspecified: Secondary | ICD-10-CM | POA: Diagnosis not present

## 2018-05-17 DIAGNOSIS — M549 Dorsalgia, unspecified: Secondary | ICD-10-CM | POA: Diagnosis present

## 2018-05-17 DIAGNOSIS — Z9102 Food additives allergy status: Secondary | ICD-10-CM

## 2018-05-17 DIAGNOSIS — R000 Tachycardia, unspecified: Secondary | ICD-10-CM | POA: Diagnosis not present

## 2018-05-17 DIAGNOSIS — D62 Acute posthemorrhagic anemia: Secondary | ICD-10-CM | POA: Diagnosis not present

## 2018-05-17 DIAGNOSIS — Z6838 Body mass index (BMI) 38.0-38.9, adult: Secondary | ICD-10-CM

## 2018-05-17 DIAGNOSIS — E785 Hyperlipidemia, unspecified: Secondary | ICD-10-CM | POA: Diagnosis present

## 2018-05-17 DIAGNOSIS — M1710 Unilateral primary osteoarthritis, unspecified knee: Secondary | ICD-10-CM | POA: Diagnosis present

## 2018-05-17 DIAGNOSIS — I454 Nonspecific intraventricular block: Secondary | ICD-10-CM | POA: Diagnosis present

## 2018-05-17 DIAGNOSIS — Z79899 Other long term (current) drug therapy: Secondary | ICD-10-CM

## 2018-05-17 DIAGNOSIS — E119 Type 2 diabetes mellitus without complications: Secondary | ICD-10-CM | POA: Diagnosis present

## 2018-05-17 DIAGNOSIS — Z902 Acquired absence of lung [part of]: Secondary | ICD-10-CM

## 2018-05-17 DIAGNOSIS — R402143 Coma scale, eyes open, spontaneous, at hospital admission: Secondary | ICD-10-CM | POA: Diagnosis present

## 2018-05-17 DIAGNOSIS — M1990 Unspecified osteoarthritis, unspecified site: Secondary | ICD-10-CM | POA: Diagnosis present

## 2018-05-17 DIAGNOSIS — T82855A Stenosis of coronary artery stent, initial encounter: Secondary | ICD-10-CM | POA: Diagnosis present

## 2018-05-17 DIAGNOSIS — Z952 Presence of prosthetic heart valve: Secondary | ICD-10-CM

## 2018-05-17 DIAGNOSIS — Z791 Long term (current) use of non-steroidal anti-inflammatories (NSAID): Secondary | ICD-10-CM

## 2018-05-17 DIAGNOSIS — R402363 Coma scale, best motor response, obeys commands, at hospital admission: Secondary | ICD-10-CM | POA: Diagnosis present

## 2018-05-17 DIAGNOSIS — I25119 Atherosclerotic heart disease of native coronary artery with unspecified angina pectoris: Secondary | ICD-10-CM | POA: Diagnosis present

## 2018-05-17 DIAGNOSIS — I350 Nonrheumatic aortic (valve) stenosis: Secondary | ICD-10-CM | POA: Diagnosis not present

## 2018-05-17 DIAGNOSIS — Z951 Presence of aortocoronary bypass graft: Secondary | ICD-10-CM | POA: Diagnosis not present

## 2018-05-17 DIAGNOSIS — I252 Old myocardial infarction: Secondary | ICD-10-CM

## 2018-05-17 DIAGNOSIS — D6959 Other secondary thrombocytopenia: Secondary | ICD-10-CM | POA: Diagnosis not present

## 2018-05-17 DIAGNOSIS — I2510 Atherosclerotic heart disease of native coronary artery without angina pectoris: Secondary | ICD-10-CM | POA: Diagnosis not present

## 2018-05-17 DIAGNOSIS — C3412 Malignant neoplasm of upper lobe, left bronchus or lung: Secondary | ICD-10-CM | POA: Diagnosis not present

## 2018-05-17 DIAGNOSIS — R0609 Other forms of dyspnea: Secondary | ICD-10-CM | POA: Diagnosis present

## 2018-05-17 DIAGNOSIS — C3492 Malignant neoplasm of unspecified part of left bronchus or lung: Secondary | ICD-10-CM | POA: Diagnosis present

## 2018-05-17 DIAGNOSIS — Z87891 Personal history of nicotine dependence: Secondary | ICD-10-CM

## 2018-05-17 DIAGNOSIS — R110 Nausea: Secondary | ICD-10-CM | POA: Diagnosis not present

## 2018-05-17 DIAGNOSIS — R51 Headache: Secondary | ICD-10-CM | POA: Diagnosis not present

## 2018-05-17 DIAGNOSIS — Z09 Encounter for follow-up examination after completed treatment for conditions other than malignant neoplasm: Secondary | ICD-10-CM

## 2018-05-17 DIAGNOSIS — R079 Chest pain, unspecified: Secondary | ICD-10-CM | POA: Diagnosis not present

## 2018-05-17 DIAGNOSIS — I25709 Atherosclerosis of coronary artery bypass graft(s), unspecified, with unspecified angina pectoris: Secondary | ICD-10-CM | POA: Diagnosis present

## 2018-05-17 DIAGNOSIS — Z792 Long term (current) use of antibiotics: Secondary | ICD-10-CM

## 2018-05-17 DIAGNOSIS — Z4682 Encounter for fitting and adjustment of non-vascular catheter: Secondary | ICD-10-CM | POA: Diagnosis not present

## 2018-05-17 DIAGNOSIS — F329 Major depressive disorder, single episode, unspecified: Secondary | ICD-10-CM | POA: Diagnosis present

## 2018-05-17 DIAGNOSIS — E669 Obesity, unspecified: Secondary | ICD-10-CM | POA: Diagnosis present

## 2018-05-17 DIAGNOSIS — J9811 Atelectasis: Secondary | ICD-10-CM | POA: Diagnosis not present

## 2018-05-17 DIAGNOSIS — J90 Pleural effusion, not elsewhere classified: Secondary | ICD-10-CM | POA: Diagnosis not present

## 2018-05-17 DIAGNOSIS — Z91018 Allergy to other foods: Secondary | ICD-10-CM

## 2018-05-17 DIAGNOSIS — N390 Urinary tract infection, site not specified: Secondary | ICD-10-CM | POA: Diagnosis present

## 2018-05-17 DIAGNOSIS — G8918 Other acute postprocedural pain: Secondary | ICD-10-CM | POA: Diagnosis not present

## 2018-05-17 DIAGNOSIS — M2550 Pain in unspecified joint: Secondary | ICD-10-CM | POA: Diagnosis present

## 2018-05-17 DIAGNOSIS — Z91048 Other nonmedicinal substance allergy status: Secondary | ICD-10-CM

## 2018-05-17 DIAGNOSIS — Z7984 Long term (current) use of oral hypoglycemic drugs: Secondary | ICD-10-CM

## 2018-05-17 DIAGNOSIS — Z955 Presence of coronary angioplasty implant and graft: Secondary | ICD-10-CM

## 2018-05-17 DIAGNOSIS — Z7902 Long term (current) use of antithrombotics/antiplatelets: Secondary | ICD-10-CM

## 2018-05-17 DIAGNOSIS — Z886 Allergy status to analgesic agent status: Secondary | ICD-10-CM

## 2018-05-17 DIAGNOSIS — R402253 Coma scale, best verbal response, oriented, at hospital admission: Secondary | ICD-10-CM | POA: Diagnosis present

## 2018-05-17 DIAGNOSIS — I358 Other nonrheumatic aortic valve disorders: Secondary | ICD-10-CM | POA: Diagnosis not present

## 2018-05-17 DIAGNOSIS — Z96642 Presence of left artificial hip joint: Secondary | ICD-10-CM

## 2018-05-17 HISTORY — PX: AORTIC VALVE REPLACEMENT: SHX41

## 2018-05-17 HISTORY — PX: CORONARY ARTERY BYPASS GRAFT: SHX141

## 2018-05-17 HISTORY — PX: TEE WITHOUT CARDIOVERSION: SHX5443

## 2018-05-17 HISTORY — PX: WEDGE RESECTION: SHX5070

## 2018-05-17 LAB — POCT I-STAT, CHEM 8
BUN: 16 mg/dL (ref 8–23)
BUN: 15 mg/dL (ref 8–23)
BUN: 16 mg/dL (ref 8–23)
BUN: 15 mg/dL (ref 8–23)
BUN: 15 mg/dL (ref 8–23)
BUN: 14 mg/dL (ref 8–23)
BUN: 13 mg/dL (ref 8–23)
Calcium, Ion: 1.21 mmol/L (ref 1.15–1.40)
Calcium, Ion: 1.05 mmol/L — ABNORMAL LOW (ref 1.15–1.40)
Calcium, Ion: 1.22 mmol/L (ref 1.15–1.40)
Calcium, Ion: 1.11 mmol/L — ABNORMAL LOW (ref 1.15–1.40)
Calcium, Ion: 1.13 mmol/L — ABNORMAL LOW (ref 1.15–1.40)
Calcium, Ion: 1.1 mmol/L — ABNORMAL LOW (ref 1.15–1.40)
Calcium, Ion: 1.12 mmol/L — ABNORMAL LOW (ref 1.15–1.40)
Chloride: 108 mmol/L (ref 98–111)
Chloride: 102 mmol/L (ref 98–111)
Chloride: 107 mmol/L (ref 98–111)
Chloride: 105 mmol/L (ref 98–111)
Chloride: 105 mmol/L (ref 98–111)
Chloride: 106 mmol/L (ref 98–111)
Chloride: 105 mmol/L (ref 98–111)
Creatinine, Ser: 1 mg/dL (ref 0.44–1.00)
Creatinine, Ser: 0.9 mg/dL (ref 0.44–1.00)
Creatinine, Ser: 1.1 mg/dL — ABNORMAL HIGH (ref 0.44–1.00)
Creatinine, Ser: 0.8 mg/dL (ref 0.44–1.00)
Creatinine, Ser: 0.9 mg/dL (ref 0.44–1.00)
Creatinine, Ser: 0.9 mg/dL (ref 0.44–1.00)
Creatinine, Ser: 0.8 mg/dL (ref 0.44–1.00)
Glucose, Bld: 108 mg/dL — ABNORMAL HIGH (ref 70–99)
Glucose, Bld: 126 mg/dL — ABNORMAL HIGH (ref 70–99)
Glucose, Bld: 140 mg/dL — ABNORMAL HIGH (ref 70–99)
Glucose, Bld: 176 mg/dL — ABNORMAL HIGH (ref 70–99)
Glucose, Bld: 172 mg/dL — ABNORMAL HIGH (ref 70–99)
Glucose, Bld: 126 mg/dL — ABNORMAL HIGH (ref 70–99)
Glucose, Bld: 119 mg/dL — ABNORMAL HIGH (ref 70–99)
HCT: 28 % — ABNORMAL LOW (ref 36.0–46.0)
HCT: 21 % — ABNORMAL LOW (ref 36.0–46.0)
HCT: 28 % — ABNORMAL LOW (ref 36.0–46.0)
HCT: 26 % — ABNORMAL LOW (ref 36.0–46.0)
HCT: 26 % — ABNORMAL LOW (ref 36.0–46.0)
HCT: 25 % — ABNORMAL LOW (ref 36.0–46.0)
HCT: 28 % — ABNORMAL LOW (ref 36.0–46.0)
Hemoglobin: 9.5 g/dL — ABNORMAL LOW (ref 12.0–15.0)
Hemoglobin: 7.1 g/dL — ABNORMAL LOW (ref 12.0–15.0)
Hemoglobin: 9.5 g/dL — ABNORMAL LOW (ref 12.0–15.0)
Hemoglobin: 8.8 g/dL — ABNORMAL LOW (ref 12.0–15.0)
Hemoglobin: 8.8 g/dL — ABNORMAL LOW (ref 12.0–15.0)
Hemoglobin: 8.5 g/dL — ABNORMAL LOW (ref 12.0–15.0)
Hemoglobin: 9.5 g/dL — ABNORMAL LOW (ref 12.0–15.0)
Potassium: 3.6 mmol/L (ref 3.5–5.1)
Potassium: 3.6 mmol/L (ref 3.5–5.1)
Potassium: 3.7 mmol/L (ref 3.5–5.1)
Potassium: 4 mmol/L (ref 3.5–5.1)
Potassium: 3.9 mmol/L (ref 3.5–5.1)
Potassium: 3.7 mmol/L (ref 3.5–5.1)
Potassium: 4.1 mmol/L (ref 3.5–5.1)
Sodium: 140 mmol/L (ref 135–145)
Sodium: 139 mmol/L (ref 135–145)
Sodium: 140 mmol/L (ref 135–145)
Sodium: 138 mmol/L (ref 135–145)
Sodium: 138 mmol/L (ref 135–145)
Sodium: 139 mmol/L (ref 135–145)
Sodium: 140 mmol/L (ref 135–145)
TCO2: 25 mmol/L (ref 22–32)
TCO2: 23 mmol/L (ref 22–32)
TCO2: 25 mmol/L (ref 22–32)
TCO2: 24 mmol/L (ref 22–32)
TCO2: 23 mmol/L (ref 22–32)
TCO2: 21 mmol/L — ABNORMAL LOW (ref 22–32)
TCO2: 21 mmol/L — ABNORMAL LOW (ref 22–32)

## 2018-05-17 LAB — CBC
HCT: 31.6 % — ABNORMAL LOW (ref 36.0–46.0)
HCT: 30.2 % — ABNORMAL LOW (ref 36.0–46.0)
Hemoglobin: 10.2 g/dL — ABNORMAL LOW (ref 12.0–15.0)
Hemoglobin: 9.7 g/dL — ABNORMAL LOW (ref 12.0–15.0)
MCH: 30.8 pg (ref 26.0–34.0)
MCH: 30.7 pg (ref 26.0–34.0)
MCHC: 32.3 g/dL (ref 30.0–36.0)
MCHC: 32.1 g/dL (ref 30.0–36.0)
MCV: 95.5 fL (ref 78.0–100.0)
MCV: 95.6 fL (ref 78.0–100.0)
Platelets: 148 10*3/uL — ABNORMAL LOW (ref 150–400)
Platelets: 152 10*3/uL (ref 150–400)
RBC: 3.31 MIL/uL — AB (ref 3.87–5.11)
RBC: 3.16 MIL/uL — ABNORMAL LOW (ref 3.87–5.11)
RDW: 12.2 % (ref 11.5–15.5)
RDW: 12.5 % (ref 11.5–15.5)
WBC: 9.2 10*3/uL (ref 4.0–10.5)
WBC: 9.6 10*3/uL (ref 4.0–10.5)

## 2018-05-17 LAB — GLUCOSE, CAPILLARY
Glucose-Capillary: 131 mg/dL — ABNORMAL HIGH (ref 70–99)
Glucose-Capillary: 81 mg/dL (ref 70–99)
Glucose-Capillary: 112 mg/dL — ABNORMAL HIGH (ref 70–99)
Glucose-Capillary: 117 mg/dL — ABNORMAL HIGH (ref 70–99)
Glucose-Capillary: 96 mg/dL (ref 70–99)
Glucose-Capillary: 101 mg/dL — ABNORMAL HIGH (ref 70–99)
Glucose-Capillary: 143 mg/dL — ABNORMAL HIGH (ref 70–99)
Glucose-Capillary: 121 mg/dL — ABNORMAL HIGH (ref 70–99)
Glucose-Capillary: 92 mg/dL (ref 70–99)

## 2018-05-17 LAB — POCT I-STAT 3, ART BLOOD GAS (G3+)
Acid-base deficit: 7 mmol/L — ABNORMAL HIGH (ref 0.0–2.0)
Acid-base deficit: 4 mmol/L — ABNORMAL HIGH (ref 0.0–2.0)
Acid-base deficit: 4 mmol/L — ABNORMAL HIGH (ref 0.0–2.0)
Bicarbonate: 24.3 mmol/L (ref 20.0–28.0)
Bicarbonate: 19.7 mmol/L — ABNORMAL LOW (ref 20.0–28.0)
Bicarbonate: 21 mmol/L (ref 20.0–28.0)
Bicarbonate: 21.3 mmol/L (ref 20.0–28.0)
O2 Saturation: 100 %
O2 Saturation: 94 %
O2 Saturation: 96 %
O2 Saturation: 97 %
Patient temperature: 36
Patient temperature: 37
Patient temperature: 37.8
TCO2: 25 mmol/L (ref 22–32)
TCO2: 21 mmol/L — ABNORMAL LOW (ref 22–32)
TCO2: 22 mmol/L (ref 22–32)
TCO2: 22 mmol/L (ref 22–32)
pCO2 arterial: 38.7 mmHg (ref 32.0–48.0)
pCO2 arterial: 41.6 mmHg (ref 32.0–48.0)
pCO2 arterial: 38.7 mmHg (ref 32.0–48.0)
pCO2 arterial: 41.9 mmHg (ref 32.0–48.0)
pH, Arterial: 7.406 (ref 7.350–7.450)
pH, Arterial: 7.278 — ABNORMAL LOW (ref 7.350–7.450)
pH, Arterial: 7.343 — ABNORMAL LOW (ref 7.350–7.450)
pH, Arterial: 7.317 — ABNORMAL LOW (ref 7.350–7.450)
pO2, Arterial: 309 mmHg — ABNORMAL HIGH (ref 83.0–108.0)
pO2, Arterial: 78 mmHg — ABNORMAL LOW (ref 83.0–108.0)
pO2, Arterial: 89 mmHg (ref 83.0–108.0)
pO2, Arterial: 98 mmHg (ref 83.0–108.0)

## 2018-05-17 LAB — POCT I-STAT 4, (NA,K, GLUC, HGB,HCT)
Glucose, Bld: 67 mg/dL — ABNORMAL LOW (ref 70–99)
HCT: 27 % — ABNORMAL LOW (ref 36.0–46.0)
Hemoglobin: 9.2 g/dL — ABNORMAL LOW (ref 12.0–15.0)
Potassium: 3.5 mmol/L (ref 3.5–5.1)
Sodium: 140 mmol/L (ref 135–145)

## 2018-05-17 LAB — ECHO TEE
AV Area VTI: 0.79 cm2
AV Mean grad: 49 mmHg
AV Peak grad: 79 mmHg
LVOT diameter: 22 mm

## 2018-05-17 LAB — PROTIME-INR
INR: 1.53
PROTHROMBIN TIME: 18.3 s — AB (ref 11.4–15.2)

## 2018-05-17 LAB — PREPARE RBC (CROSSMATCH)

## 2018-05-17 LAB — CREATININE, SERUM
Creatinine, Ser: 0.99 mg/dL (ref 0.44–1.00)
GFR calc Af Amer: >60 mL/min (ref >60)
GFR calc non Af Amer: >60 mL/min (ref >60)

## 2018-05-17 LAB — HEMOGLOBIN AND HEMATOCRIT, BLOOD
HCT: 27.3 % — ABNORMAL LOW (ref 36.0–46.0)
Hemoglobin: 8.8 g/dL — ABNORMAL LOW (ref 12.0–15.0)

## 2018-05-17 LAB — MAGNESIUM: Magnesium: 2.9 mg/dL — ABNORMAL HIGH (ref 1.7–2.4)

## 2018-05-17 LAB — APTT: aPTT: 27 seconds (ref 24–36)

## 2018-05-17 LAB — PLATELET COUNT: Platelets: 164 10*3/uL (ref 150–400)

## 2018-05-17 SURGERY — CORONARY ARTERY BYPASS GRAFTING (CABG)
Anesthesia: General | Site: Chest

## 2018-05-17 MED ORDER — LIDOCAINE HCL (CARDIAC) PF 100 MG/5ML IV SOSY
PREFILLED_SYRINGE | INTRAVENOUS | Status: DC | PRN
  Administered 2018-05-17: 60 mg via INTRAVENOUS

## 2018-05-17 MED ORDER — MAGNESIUM SULFATE 4 GM/100ML IV SOLN
4.0000 g | Freq: Once | INTRAVENOUS | Status: AC
  Administered 2018-05-17: 4 g via INTRAVENOUS
  Filled 2018-05-17: qty 100

## 2018-05-17 MED ORDER — SODIUM CHLORIDE 0.9 % IJ SOLN
INTRAMUSCULAR | Status: AC
  Filled 2018-05-17: qty 40

## 2018-05-17 MED ORDER — PROTAMINE SULFATE 10 MG/ML IV SOLN
INTRAVENOUS | Status: AC
  Filled 2018-05-17: qty 20

## 2018-05-17 MED ORDER — PROTAMINE SULFATE 10 MG/ML IV SOLN
INTRAVENOUS | Status: AC
  Filled 2018-05-17: qty 25

## 2018-05-17 MED ORDER — LACTATED RINGERS IV SOLN
INTRAVENOUS | Status: DC | PRN
  Administered 2018-05-17: 07:00:00 via INTRAVENOUS

## 2018-05-17 MED ORDER — SODIUM CHLORIDE 0.9% FLUSH: 3.0000 mL | INTRAVENOUS | Status: DC | PRN

## 2018-05-17 MED ORDER — ACETAMINOPHEN 160 MG/5ML PO SOLN: 1000.0000 mg | Freq: Four times a day (QID) | ORAL | Status: DC

## 2018-05-17 MED ORDER — EPHEDRINE 5 MG/ML INJ
INTRAVENOUS | Status: AC
  Filled 2018-05-17: qty 10

## 2018-05-17 MED ORDER — TRAMADOL HCL 50 MG PO TABS
50.0000 mg | ORAL_TABLET | ORAL | Status: DC | PRN
  Administered 2018-05-18 (×3): 50 mg via ORAL
  Filled 2018-05-17: qty 2
  Filled 2018-05-17: qty 1

## 2018-05-17 MED ORDER — SODIUM CHLORIDE 0.9 % IV SOLN
INTRAVENOUS | Status: DC
  Filled 2018-05-17: qty 1

## 2018-05-17 MED ORDER — SERTRALINE HCL 25 MG PO TABS
25.0000 mg | ORAL_TABLET | Freq: Every day | ORAL | Status: DC
  Administered 2018-05-18 – 2018-05-24 (×7): 25 mg via ORAL
  Filled 2018-05-17 (×7): qty 1

## 2018-05-17 MED ORDER — LACTATED RINGERS IV SOLN: 500.0000 mL | Freq: Once | INTRAVENOUS | Status: DC | PRN

## 2018-05-17 MED ORDER — MORPHINE SULFATE (PF) 2 MG/ML IV SOLN
1.0000 mg | INTRAVENOUS | Status: AC | PRN
  Administered 2018-05-17: 2 mg via INTRAVENOUS
  Administered 2018-05-17 (×2): 4 mg via INTRAVENOUS
  Filled 2018-05-17 (×2): qty 2
  Filled 2018-05-17: qty 1
  Filled 2018-05-17: qty 2

## 2018-05-17 MED ORDER — SODIUM CHLORIDE 0.9 % IV SOLN
INTRAVENOUS | Status: DC | PRN
  Administered 2018-05-17: 0.2 ug/kg/h via INTRAVENOUS

## 2018-05-17 MED ORDER — ORAL CARE MOUTH RINSE
15.0000 mL | Freq: Two times a day (BID) | OROMUCOSAL | Status: DC
  Administered 2018-05-18 – 2018-05-21 (×4): 15 mL via OROMUCOSAL

## 2018-05-17 MED ORDER — ACETAMINOPHEN 650 MG RE SUPP
650.0000 mg | Freq: Once | RECTAL | Status: AC
  Administered 2018-05-17: 650 mg via RECTAL

## 2018-05-17 MED ORDER — LACTATED RINGERS IV SOLN
INTRAVENOUS | Status: DC | PRN
  Administered 2018-05-17 (×3): via INTRAVENOUS

## 2018-05-17 MED ORDER — DEXTROSE 50 % IV SOLN
INTRAVENOUS | Status: AC
  Administered 2018-05-17: 13 mL
  Filled 2018-05-17: qty 50

## 2018-05-17 MED ORDER — SODIUM CHLORIDE 0.9 % IV SOLN
1.5000 g | Freq: Two times a day (BID) | INTRAVENOUS | Status: AC
  Administered 2018-05-17 – 2018-05-19 (×4): 1.5 g via INTRAVENOUS
  Filled 2018-05-17 (×4): qty 1.5

## 2018-05-17 MED ORDER — ONDANSETRON HCL 4 MG/2ML IJ SOLN: 4.0000 mg | Freq: Four times a day (QID) | INTRAMUSCULAR | Status: DC | PRN

## 2018-05-17 MED ORDER — MIDAZOLAM HCL 5 MG/5ML IJ SOLN
INTRAMUSCULAR | Status: DC | PRN
  Administered 2018-05-17 (×2): 2 mg via INTRAVENOUS
  Administered 2018-05-17: 1 mg via INTRAVENOUS
  Administered 2018-05-17 (×3): 2 mg via INTRAVENOUS
  Administered 2018-05-17: 1 mg via INTRAVENOUS
  Administered 2018-05-17: 2 mg via INTRAVENOUS
  Administered 2018-05-17 (×2): 3 mg via INTRAVENOUS

## 2018-05-17 MED ORDER — SODIUM CHLORIDE 0.9 % IV SOLN
INTRAVENOUS | Status: DC | PRN
  Administered 2018-05-17: 40 ug/min via INTRAVENOUS

## 2018-05-17 MED ORDER — INSULIN REGULAR BOLUS VIA INFUSION
0.0000 [IU] | Freq: Three times a day (TID) | INTRAVENOUS | Status: DC
  Filled 2018-05-17: qty 10

## 2018-05-17 MED ORDER — ROCURONIUM BROMIDE 50 MG/5ML IV SOSY
PREFILLED_SYRINGE | INTRAVENOUS | Status: AC
  Filled 2018-05-17: qty 5

## 2018-05-17 MED ORDER — OXYCODONE HCL 5 MG PO TABS
5.0000 mg | ORAL_TABLET | ORAL | Status: DC | PRN
  Administered 2018-05-17: 10 mg via ORAL
  Administered 2018-05-18 – 2018-05-19 (×3): 5 mg via ORAL
  Filled 2018-05-17 (×3): qty 1
  Filled 2018-05-17: qty 2

## 2018-05-17 MED ORDER — ROCURONIUM BROMIDE 100 MG/10ML IV SOLN
INTRAVENOUS | Status: DC | PRN
  Administered 2018-05-17 (×5): 50 mg via INTRAVENOUS

## 2018-05-17 MED ORDER — VANCOMYCIN HCL IN DEXTROSE 1-5 GM/200ML-% IV SOLN
1000.0000 mg | Freq: Two times a day (BID) | INTRAVENOUS | Status: AC
  Administered 2018-05-17: 1000 mg via INTRAVENOUS
  Filled 2018-05-17: qty 200

## 2018-05-17 MED ORDER — HEMOSTATIC AGENTS (NO CHARGE) OPTIME
TOPICAL | Status: DC | PRN
  Administered 2018-05-17: 1 via TOPICAL

## 2018-05-17 MED ORDER — ARTIFICIAL TEARS OPHTHALMIC OINT
TOPICAL_OINTMENT | OPHTHALMIC | Status: AC
  Filled 2018-05-17: qty 3.5

## 2018-05-17 MED ORDER — SODIUM CHLORIDE 0.9 % IV SOLN
250.0000 mL | INTRAVENOUS | Status: DC
  Administered 2018-05-18: 250 mL via INTRAVENOUS

## 2018-05-17 MED ORDER — PHENYLEPHRINE HCL-NACL 20-0.9 MG/250ML-% IV SOLN
0.0000 ug/min | INTRAVENOUS | Status: DC
  Administered 2018-05-18: 30 ug/min via INTRAVENOUS
  Filled 2018-05-17: qty 250

## 2018-05-17 MED ORDER — CHLORHEXIDINE GLUCONATE 0.12 % MT SOLN
15.0000 mL | Freq: Once | OROMUCOSAL | Status: AC
  Administered 2018-05-17: 15 mL via OROMUCOSAL
  Filled 2018-05-17: qty 15

## 2018-05-17 MED ORDER — TRANEXAMIC ACID 1000 MG/10ML IV SOLN
INTRAVENOUS | Status: DC | PRN
  Administered 2018-05-17: 1.5 mg/kg/h via INTRAVENOUS

## 2018-05-17 MED ORDER — MIDAZOLAM HCL 10 MG/2ML IJ SOLN
INTRAMUSCULAR | Status: AC
  Filled 2018-05-17: qty 2

## 2018-05-17 MED ORDER — SODIUM CHLORIDE 0.9% FLUSH
3.0000 mL | Freq: Two times a day (BID) | INTRAVENOUS | Status: DC
  Administered 2018-05-18 – 2018-05-19 (×4): 3 mL via INTRAVENOUS

## 2018-05-17 MED ORDER — ROCURONIUM BROMIDE 50 MG/5ML IV SOSY
PREFILLED_SYRINGE | INTRAVENOUS | Status: AC
  Filled 2018-05-17: qty 20

## 2018-05-17 MED ORDER — METOPROLOL TARTRATE 25 MG/10 ML ORAL SUSPENSION: 12.5000 mg | Freq: Two times a day (BID) | ORAL | Status: DC

## 2018-05-17 MED ORDER — METOPROLOL TARTRATE 12.5 MG HALF TABLET
12.5000 mg | ORAL_TABLET | Freq: Once | ORAL | Status: AC
  Administered 2018-05-17: 12.5 mg via ORAL
  Filled 2018-05-17: qty 1

## 2018-05-17 MED ORDER — SUCCINYLCHOLINE CHLORIDE 200 MG/10ML IV SOSY
PREFILLED_SYRINGE | INTRAVENOUS | Status: AC
  Filled 2018-05-17: qty 10

## 2018-05-17 MED ORDER — LIDOCAINE 2% (20 MG/ML) 5 ML SYRINGE
INTRAMUSCULAR | Status: AC
  Filled 2018-05-17: qty 5

## 2018-05-17 MED ORDER — LACTATED RINGERS IV SOLN
INTRAVENOUS | Status: DC
  Administered 2018-05-18: 01:00:00 via INTRAVENOUS

## 2018-05-17 MED ORDER — PROPOFOL 10 MG/ML IV BOLUS
INTRAVENOUS | Status: AC
  Filled 2018-05-17: qty 20

## 2018-05-17 MED ORDER — 0.9 % SODIUM CHLORIDE (POUR BTL) OPTIME
TOPICAL | Status: DC | PRN
  Administered 2018-05-17: 5000 mL
  Administered 2018-05-17: 1000 mL

## 2018-05-17 MED ORDER — ALBUMIN HUMAN 5 % IV SOLN
250.0000 mL | INTRAVENOUS | Status: AC | PRN
  Administered 2018-05-17 (×2): 12.5 g via INTRAVENOUS

## 2018-05-17 MED ORDER — METOPROLOL TARTRATE 5 MG/5ML IV SOLN: 2.5000 mg | INTRAVENOUS | Status: DC | PRN

## 2018-05-17 MED ORDER — MIDAZOLAM HCL 2 MG/2ML IJ SOLN: 2.0000 mg | INTRAMUSCULAR | Status: DC | PRN

## 2018-05-17 MED ORDER — ACETAMINOPHEN 500 MG PO TABS
1000.0000 mg | ORAL_TABLET | Freq: Four times a day (QID) | ORAL | Status: DC
  Administered 2018-05-17 – 2018-05-20 (×10): 1000 mg via ORAL
  Filled 2018-05-17 (×10): qty 2

## 2018-05-17 MED ORDER — ETOMIDATE 2 MG/ML IV SOLN
INTRAVENOUS | Status: AC
  Filled 2018-05-17: qty 10

## 2018-05-17 MED ORDER — FENTANYL CITRATE (PF) 250 MCG/5ML IJ SOLN
INTRAMUSCULAR | Status: AC
  Filled 2018-05-17: qty 30

## 2018-05-17 MED ORDER — METOPROLOL TARTRATE 12.5 MG HALF TABLET
12.5000 mg | ORAL_TABLET | Freq: Two times a day (BID) | ORAL | Status: DC
  Administered 2018-05-18 – 2018-05-19 (×4): 12.5 mg via ORAL
  Filled 2018-05-17 (×4): qty 1

## 2018-05-17 MED ORDER — MORPHINE SULFATE (PF) 2 MG/ML IV SOLN
2.0000 mg | INTRAVENOUS | Status: DC | PRN
  Administered 2018-05-18 (×5): 4 mg via INTRAVENOUS
  Administered 2018-05-19 (×2): 2 mg via INTRAVENOUS
  Filled 2018-05-17 (×2): qty 2
  Filled 2018-05-17: qty 1
  Filled 2018-05-17 (×4): qty 2

## 2018-05-17 MED ORDER — SODIUM CHLORIDE 0.9 % IV SOLN
INTRAVENOUS | Status: DC
  Administered 2018-05-17: 15:00:00 via INTRAVENOUS

## 2018-05-17 MED ORDER — LACTATED RINGERS IV SOLN: INTRAVENOUS | Status: DC

## 2018-05-17 MED ORDER — LACTATED RINGERS IV SOLN
INTRAVENOUS | Status: DC | PRN
  Administered 2018-05-17 (×3): via INTRAVENOUS

## 2018-05-17 MED ORDER — METOCLOPRAMIDE HCL 5 MG/ML IJ SOLN
10.0000 mg | Freq: Four times a day (QID) | INTRAMUSCULAR | Status: DC
  Administered 2018-05-17 – 2018-05-20 (×12): 10 mg via INTRAVENOUS
  Filled 2018-05-17 (×11): qty 2

## 2018-05-17 MED ORDER — SODIUM CHLORIDE 0.9 % IV SOLN
INTRAVENOUS | Status: DC | PRN
  Administered 2018-05-17: 1 [IU]/h via INTRAVENOUS

## 2018-05-17 MED ORDER — CHLORHEXIDINE GLUCONATE 4 % EX LIQD: 30.0000 mL | CUTANEOUS | Status: DC

## 2018-05-17 MED ORDER — PROTAMINE SULFATE 10 MG/ML IV SOLN
INTRAVENOUS | Status: DC | PRN
  Administered 2018-05-17: 260 mg via INTRAVENOUS
  Administered 2018-05-17: 10 mg via INTRAVENOUS

## 2018-05-17 MED ORDER — SODIUM CHLORIDE 0.9% IV SOLUTION: Freq: Once | INTRAVENOUS | Status: DC

## 2018-05-17 MED ORDER — ACETAMINOPHEN 160 MG/5ML PO SOLN: 650.0000 mg | Freq: Once | ORAL | Status: AC

## 2018-05-17 MED ORDER — HEPARIN SODIUM (PORCINE) 1000 UNIT/ML IJ SOLN
INTRAMUSCULAR | Status: DC | PRN
  Administered 2018-05-17: 29000 [IU] via INTRAVENOUS

## 2018-05-17 MED ORDER — FAMOTIDINE IN NACL 20-0.9 MG/50ML-% IV SOLN
20.0000 mg | Freq: Two times a day (BID) | INTRAVENOUS | Status: AC
  Administered 2018-05-17 (×2): 20 mg via INTRAVENOUS
  Filled 2018-05-17: qty 50

## 2018-05-17 MED ORDER — CHLORHEXIDINE GLUCONATE 0.12 % MT SOLN
15.0000 mL | OROMUCOSAL | Status: AC
  Administered 2018-05-17: 15 mL via OROMUCOSAL

## 2018-05-17 MED ORDER — SODIUM CHLORIDE 0.45 % IV SOLN
INTRAVENOUS | Status: DC | PRN
  Administered 2018-05-17: 19:00:00 via INTRAVENOUS

## 2018-05-17 MED ORDER — PANTOPRAZOLE SODIUM 40 MG PO TBEC
40.0000 mg | DELAYED_RELEASE_TABLET | Freq: Every day | ORAL | Status: DC
  Administered 2018-05-19: 40 mg via ORAL
  Filled 2018-05-17: qty 1

## 2018-05-17 MED ORDER — DEXMEDETOMIDINE HCL IN NACL 200 MCG/50ML IV SOLN
0.0000 ug/kg/h | INTRAVENOUS | Status: DC
  Administered 2018-05-17: 0.1 ug/kg/h via INTRAVENOUS
  Filled 2018-05-17: qty 50

## 2018-05-17 MED ORDER — DOCUSATE SODIUM 100 MG PO CAPS
200.0000 mg | ORAL_CAPSULE | Freq: Every day | ORAL | Status: DC
  Administered 2018-05-18 – 2018-05-19 (×2): 200 mg via ORAL
  Filled 2018-05-17 (×2): qty 2

## 2018-05-17 MED ORDER — FENTANYL CITRATE (PF) 250 MCG/5ML IJ SOLN
INTRAMUSCULAR | Status: DC | PRN
  Administered 2018-05-17: 250 ug via INTRAVENOUS
  Administered 2018-05-17: 100 ug via INTRAVENOUS
  Administered 2018-05-17: 150 ug via INTRAVENOUS
  Administered 2018-05-17 (×2): 100 ug via INTRAVENOUS
  Administered 2018-05-17: 50 ug via INTRAVENOUS
  Administered 2018-05-17: 100 ug via INTRAVENOUS
  Administered 2018-05-17 (×2): 250 ug via INTRAVENOUS

## 2018-05-17 MED ORDER — SODIUM BICARBONATE 8.4 % IV SOLN
50.0000 meq | Freq: Once | INTRAVENOUS | Status: AC
  Administered 2018-05-17: 50 meq via INTRAVENOUS

## 2018-05-17 MED ORDER — SODIUM CHLORIDE 0.9 % IV SOLN
INTRAVENOUS | Status: DC | PRN
  Administered 2018-05-17: 14:00:00 via INTRAVENOUS

## 2018-05-17 MED ORDER — POTASSIUM CHLORIDE 10 MEQ/50ML IV SOLN
10.0000 meq | INTRAVENOUS | Status: AC
  Administered 2018-05-17 (×3): 10 meq via INTRAVENOUS

## 2018-05-17 SURGICAL SUPPLY — 100 items
ADAPTER CARDIO PERF ANTE/RETRO (ADAPTER) ×4 IMPLANT
ADPR PRFSN 84XANTGRD RTRGD (ADAPTER) ×3
AGENT HMST KT MTR STRL THRMB (HEMOSTASIS) ×3
APPLICATOR COTTON TIP 6IN STRL (MISCELLANEOUS) IMPLANT
BAG DECANTER FOR FLEXI CONT (MISCELLANEOUS) ×4 IMPLANT
BANDAGE ACE 4X5 VEL STRL LF (GAUZE/BANDAGES/DRESSINGS) ×4 IMPLANT
BANDAGE ACE 6X5 VEL STRL LF (GAUZE/BANDAGES/DRESSINGS) ×4 IMPLANT
BLADE NDL 3 SS STRL (BLADE) IMPLANT
BLADE NEEDLE 3 SS STRL (BLADE) ×4 IMPLANT
BLADE STERNUM SYSTEM 6 (BLADE) ×4 IMPLANT
BLADE SURG 11 STRL SS (BLADE) ×1 IMPLANT
BLADE SURG 15 STRL LF DISP TIS (BLADE) ×3 IMPLANT
BLADE SURG 15 STRL SS (BLADE) ×4
BNDG GAUZE ELAST 4 BULKY (GAUZE/BANDAGES/DRESSINGS) ×4 IMPLANT
BOOT SUTURE AID YELLOW STND (SUTURE) IMPLANT
CANISTER SUCT 3000ML PPV (MISCELLANEOUS) ×4 IMPLANT
CANNULA GUNDRY RCSP 15FR (MISCELLANEOUS) ×4 IMPLANT
CATH CPB KIT GERHARDT (MISCELLANEOUS) ×4 IMPLANT
CATH HEART VENT LEFT (CATHETERS) ×3 IMPLANT
CATH THORACIC 28FR (CATHETERS) ×4 IMPLANT
CATH/SQUID NICHOLS JEHLE COR (CATHETERS) IMPLANT
CONT SPEC 4OZ CLIKSEAL STRL BL (MISCELLANEOUS) ×4 IMPLANT
CRADLE DONUT ADULT HEAD (MISCELLANEOUS) ×4 IMPLANT
CUTTER ECHEON FLEX ENDO 45 340 (ENDOMECHANICALS) ×1 IMPLANT
DRAIN CHANNEL 28F RND 3/8 FF (WOUND CARE) ×4 IMPLANT
DRAPE CARDIOVASCULAR INCISE (DRAPES) ×4
DRAPE SLUSH/WARMER DISC (DRAPES) ×4 IMPLANT
DRAPE SRG 135X102X78XABS (DRAPES) ×3 IMPLANT
DRSG AQUACEL AG ADV 3.5X14 (GAUZE/BANDAGES/DRESSINGS) ×4 IMPLANT
ELECT BLADE 4.0 EZ CLEAN MEGAD (MISCELLANEOUS) ×4
ELECT CAUTERY BLADE 6.4 (BLADE) ×4 IMPLANT
ELECT REM PT RETURN 9FT ADLT (ELECTROSURGICAL) ×8
ELECTRODE BLDE 4.0 EZ CLN MEGD (MISCELLANEOUS) ×3 IMPLANT
ELECTRODE REM PT RTRN 9FT ADLT (ELECTROSURGICAL) ×6 IMPLANT
FELT TEFLON 1X6 (MISCELLANEOUS) ×8 IMPLANT
GAUZE SPONGE 4X4 12PLY STRL (GAUZE/BANDAGES/DRESSINGS) ×2 IMPLANT
GLOVE BIO SURGEON STRL SZ 6 (GLOVE) ×2 IMPLANT
GLOVE BIO SURGEON STRL SZ 6.5 (GLOVE) ×12 IMPLANT
GLOVE BIOGEL M 7.0 STRL (GLOVE) ×3 IMPLANT
GLOVE BIOGEL PI IND STRL 6 (GLOVE) IMPLANT
GLOVE BIOGEL PI IND STRL 6.5 (GLOVE) IMPLANT
GLOVE BIOGEL PI INDICATOR 6 (GLOVE) ×1
GLOVE BIOGEL PI INDICATOR 6.5 (GLOVE) ×2
GLOVE SURG SS PI 6.0 STRL IVOR (GLOVE) ×3 IMPLANT
GOWN STRL REUS W/ TWL LRG LVL3 (GOWN DISPOSABLE) ×12 IMPLANT
GOWN STRL REUS W/TWL LRG LVL3 (GOWN DISPOSABLE) ×32
HEMOSTAT SURGICEL 2X14 (HEMOSTASIS) ×4 IMPLANT
INSERT FOGARTY XLG (MISCELLANEOUS) IMPLANT
IV CATH 18G X1.75 CATHLON (IV SOLUTION) ×1 IMPLANT
KIT BASIN OR (CUSTOM PROCEDURE TRAY) ×4 IMPLANT
KIT CATH SUCT 8FR (CATHETERS) ×4 IMPLANT
KIT SUCTION CATH 14FR (SUCTIONS) ×8 IMPLANT
KIT TURNOVER KIT B (KITS) ×4 IMPLANT
KIT VASOVIEW HEMOPRO VH 3000 (KITS) ×4 IMPLANT
LEAD PACING MYOCARDI (MISCELLANEOUS) ×4 IMPLANT
LINE VENT (MISCELLANEOUS) ×1 IMPLANT
MARKER GRAFT CORONARY BYPASS (MISCELLANEOUS) ×12 IMPLANT
NDL SUT 1 .5 CRC FRENCH EYE (NEEDLE) IMPLANT
NEEDLE FRENCH EYE (NEEDLE) ×4
NS IRRIG 1000ML POUR BTL (IV SOLUTION) ×21 IMPLANT
PACK E OPEN HEART (SUTURE) ×4 IMPLANT
PACK OPEN HEART (CUSTOM PROCEDURE TRAY) ×4 IMPLANT
PAD ARMBOARD 7.5X6 YLW CONV (MISCELLANEOUS) ×8 IMPLANT
PAD ELECT DEFIB RADIOL ZOLL (MISCELLANEOUS) ×4 IMPLANT
PENCIL BUTTON HOLSTER BLD 10FT (ELECTRODE) ×4 IMPLANT
POWDER SURGICEL 3.0 GRAM (HEMOSTASIS) ×1 IMPLANT
RELOAD STAPLE 45 GOLD REG/THCK (STAPLE) IMPLANT
SET CARDIOPLEGIA MPS 5001102 (MISCELLANEOUS) ×1 IMPLANT
SOLUTION ANTI FOG 6CC (MISCELLANEOUS) ×1 IMPLANT
SPONGE LAP 18X18 X RAY DECT (DISPOSABLE) ×3 IMPLANT
STAPLE RELOAD 45MM GOLD (STAPLE) ×64 IMPLANT
SURGIFLO W/THROMBIN 8M KIT (HEMOSTASIS) ×1 IMPLANT
SUT BONE WAX W31G (SUTURE) ×4 IMPLANT
SUT ETHIBON 2 0 V 52N 30 (SUTURE) ×8 IMPLANT
SUT ETHIBOND NAB MH 2-0 36IN (SUTURE) ×1 IMPLANT
SUT MNCRL AB 4-0 PS2 18 (SUTURE) ×1 IMPLANT
SUT PROLENE 3 0 RB 1 (SUTURE) ×4 IMPLANT
SUT PROLENE 3 0 SH1 36 (SUTURE) ×4 IMPLANT
SUT PROLENE 4 0 RB 1 (SUTURE) ×16
SUT PROLENE 4 0 TF (SUTURE) ×8 IMPLANT
SUT PROLENE 4-0 RB1 .5 CRCL 36 (SUTURE) IMPLANT
SUT PROLENE 4-0 RB1 18X2 ARM (SUTURE) IMPLANT
SUT PROLENE 6 0 CC (SUTURE) ×8 IMPLANT
SUT PROLENE 7 0 BV1 MDA (SUTURE) ×4 IMPLANT
SUT STEEL 6MS V (SUTURE) ×4 IMPLANT
SUT STEEL SZ 6 DBL 3X14 BALL (SUTURE) ×4 IMPLANT
SUT VIC AB 1 CTX 18 (SUTURE) ×8 IMPLANT
SUT VIC AB 2-0 CT1 27 (SUTURE) ×4
SUT VIC AB 2-0 CT1 TAPERPNT 27 (SUTURE) IMPLANT
SYSTEM SAHARA CHEST DRAIN ATS (WOUND CARE) ×4 IMPLANT
TAPE CLOTH SURG 4X10 WHT LF (GAUZE/BANDAGES/DRESSINGS) ×1 IMPLANT
TAPE PAPER 2X10 WHT MICROPORE (GAUZE/BANDAGES/DRESSINGS) ×1 IMPLANT
TOWEL GREEN STERILE (TOWEL DISPOSABLE) ×4 IMPLANT
TOWEL GREEN STERILE FF (TOWEL DISPOSABLE) ×4 IMPLANT
TRAY FOLEY SLVR 16FR TEMP STAT (SET/KITS/TRAYS/PACK) ×4 IMPLANT
TUBING INSUFFLATION (TUBING) ×4 IMPLANT
UNDERPAD 30X30 (UNDERPADS AND DIAPERS) ×4 IMPLANT
VALVE MAGNA EASE 21MM (Prosthesis & Implant Heart) ×1 IMPLANT
VENT LEFT HEART 12002 (CATHETERS) ×4
WATER STERILE IRR 1000ML POUR (IV SOLUTION) ×8 IMPLANT

## 2018-05-17 NOTE — Progress Notes (Signed)
  Echocardiogram Echocardiogram Transesophageal has been performed.  Bobbye Charleston 05/17/2018, 10:18 AM

## 2018-05-17 NOTE — Anesthesia Procedure Notes (Signed)
Central Venous Catheter Insertion Performed by: Nolon Nations, MD, anesthesiologist Start/End9/06/2018 6:56 AM, 05/17/2018 6:58 AM Patient location: Pre-op. Preanesthetic checklist: patient identified, IV checked, site marked, risks and benefits discussed, surgical consent, monitors and equipment checked, pre-op evaluation, timeout performed and anesthesia consent Hand hygiene performed  and maximum sterile barriers used  PA cath was placed.[place] type:thermodilution PA Cath depth:42 Procedure performed without using ultrasound guided technique. Attempts: 1 Patient tolerated the procedure well with no immediate complications.

## 2018-05-17 NOTE — H&P (Signed)
[address]       [place] [address]             [phone]                    Sarah Stevenson [place] Medical Record #[id number] Date of Birth: 29-Dec-1955  Referring: Burtis Junes, NP Primary Care: Christain Sacramento, MD Primary Cardiologist: No primary care provider on file.  Chief Complaint:        Chief Complaint  Patient presents with  . Coronary Artery Disease    eval for CABG/AVR...ECHO 7/10, CATH 04/01/18  . Aortic Stenosis      History of Present Illness:    Sarah Stevenson 62 y.o. female is seen in the office   evaluation of aortic stenosis and coronary artery disease.  The patient noted increasing episodes of shortness of breath with exertion.  The symptoms have been progressive over the past 6 months.  The patient notes chest discomfort at times when walking across the parking lot to go to work, occasionally relieved by taking a nitroglycerin.  Recent cardiac catheterization and echocardiogram was performed because of the patient's increasing symptoms confirming a bicuspid aortic valve with severe aortic stenosis, by echo peak gradient estimated 76 mmHg mean gradient 41 mmHg, estimated valve area of 0.84    history of CAD with prior RCA DES in March of 2017 and to the mid RCA in November 2017 in the setting of angina, AS, DM, HLD, and OA. She has a bicuspid aortic valve. She has residual LAD disease from last cath in November of 2017.  Peak velocity across the aortic valve 437 cm a second mean velocity 300.     Current Activity/ Functional Status:  Patient is independent with mobility/ambulation, transfers, ADL's, IADL's.   Zubrod Score: At the time of surgery this patient's most appropriate activity status/level should be described as: []     0    Normal activity, no symptoms [x]     1    Restricted in physical strenuous activity but ambulatory, able to do out light work []     2    Ambulatory and capable of self care, unable to do  work activities, up and about               >50 % of waking hours                              []     3    Only limited self care, in bed greater than 50% of waking hours []     4    Completely disabled, no self care, confined to bed or chair []     5    Moribund   Past Medical History:  Diagnosis Date  . Anginal pain ([place])   . Aortic stenosis   . Coronary artery disease   . Depression    "mild" (08/06/2016)  . Dyspnea   . Hyperlipidemia   . Murmur, cardiac   . Myocardial infarction ([place]) 2016  . Osteoarthritis    "left hip and knee" (08/06/2016)  . Type II diabetes mellitus ([place])     Past Surgical History:  Procedure Laterality Date  . CARDIAC CATHETERIZATION Right 11/30/2014   Procedure: RIGHT/LEFT HEART CATH AND CORONARY ANGIOGRAPHY;  Surgeon: Belva Crome, MD;  Location: [facility] CATH LAB;  Service: Cardiovascular;  Laterality: Right;  .  CARDIAC CATHETERIZATION N/A 12/13/2015   Procedure: Left Heart Cath and Coronary Angiography;  Surgeon: Leonie Man, MD;  Location: [facility] CV LAB;  Service: Cardiovascular;  Laterality: N/A;  . CARDIAC CATHETERIZATION N/A 12/13/2015   Procedure: Intravascular Pressure Wire/FFR Study;  Surgeon: Leonie Man, MD;  Location: [facility] CV LAB;  Service: Cardiovascular;  Laterality: N/A;  RCA  . CARDIAC CATHETERIZATION N/A 08/06/2016   Procedure: Right/Left Heart Cath and Coronary Angiography;  Surgeon: Lorretta Harp, MD;  Location: [place] [facility] CV LAB;  Service: Cardiovascular;  Laterality: N/A;  . CARDIAC CATHETERIZATION N/A 08/06/2016   Procedure: Coronary Stent Intervention;  Surgeon: Lorretta Harp, MD;  Location: [facility] CV LAB;  Service: Cardiovascular;  Laterality: N/A;   distal rca 3.0x16 and 3.0x8 synergy  . CESAREAN SECTION  1989  . CORONARY ANGIOPLASTY WITH STENT PLACEMENT  08/06/2016   "2 stents"  . HYSTEROSCOPY DIAGNOSTIC  1990s  . JOINT REPLACEMENT    . KNEE ARTHROSCOPY Left 1996  . NASAL SINUS SURGERY  1977  .  RIGHT/LEFT HEART CATH AND CORONARY ANGIOGRAPHY N/A 04/01/2018   Procedure: RIGHT/LEFT HEART CATH AND CORONARY ANGIOGRAPHY;  Surgeon: Belva Crome, MD;  Location: [facility] CV LAB;  Service: Cardiovascular;  Laterality: N/A;  . TOTAL HIP ARTHROPLASTY Left 04/23/2017   Procedure: LEFT TOTAL HIP ARTHROPLASTY ANTERIOR APPROACH;  Surgeon: Mcarthur Rossetti, MD;  Location: [facility] ORS;  Service: Orthopedics;  Laterality: Left;    Family History  Problem Relation Age of Onset  . Heart attack Mother   . Heart attack Father   . Sudden death Father   . Hypertension Neg Hx   . Hyperlipidemia Neg Hx   . Diabetes Neg Hx      Social History   Tobacco Use  Smoking Status Former Smoker  . Packs/day: 1.00  . Years: 10.00  . Pack years: 10.00  . Types: Cigarettes  . Last attempt to quit: 1982  . Years since quitting: 37.7  Smokeless Tobacco Never Used    The patient  reports that she quit smoking about 37 years ago. Her smoking use included cigarettes. She has a 10.00 pack-year smoking history. She has never used smokeless tobacco. She reports that she drinks about 4.2 oz of alcohol per week. She reports that she does not use drugs.     Social History   Substance and Sexual Activity  Alcohol Use Yes  . Alcohol/week: 7.0 standard drinks  . Types: 7 Shots of liquor per week   Comment: weekly     Allergies  Allergen Reactions  . Brilinta [Ticagrelor] Shortness Of Breath  . Lisinopril Cough  . Asa [Aspirin] Hives and Rash  . Blue Dyes (Parenteral) Hives  . Corn-Containing Products Hives  . Hetastarch Hives and Rash  . Nsaids Hives and Rash  . Sugar-Protein-Starch Hives  . Tea Hives  . Isosulfan Blue Rash  . Other Itching and Rash    CHG soap    Current Facility-Administered Medications  Medication Dose Route Frequency Provider Last Rate Last Dose  . cefUROXime (ZINACEF) 1.5 g in sodium chloride 0.9 % 100 mL IVPB  1.5 g Intravenous To OR Grace Isaac, MD      .  cefUROXime (ZINACEF) 750 mg in sodium chloride 0.9 % 100 mL IVPB  750 mg Intravenous To OR Grace Isaac, MD      . chlorhexidine (HIBICLENS) 4 % liquid 2 application  30 mL Topical UD Grace Isaac, MD      .  dexmedetomidine (PRECEDEX) 400 MCG/100ML (4 mcg/mL) infusion  0.1-0.7 mcg/kg/hr Intravenous To OR Grace Isaac, MD      . DOPamine (INTROPIN) 800 mg in dextrose 5 % 250 mL (3.2 mg/mL) infusion  0-10 mcg/kg/min Intravenous To OR Grace Isaac, MD      . EPINEPHrine (ADRENALIN) 4 mg in dextrose 5 % 250 mL (0.016 mg/mL) infusion  0-10 mcg/min Intravenous To OR Grace Isaac, MD      . heparin 2,500 Units, papaverine 30 mg in electrolyte-148 (PLASMALYTE-148) 500 mL irrigation   Irrigation To OR Grace Isaac, MD      . heparin 30,000 units/NS 1000 mL solution for CELLSAVER   Other To OR Grace Isaac, MD      . insulin regular (NOVOLIN R,HUMULIN R) 100 Units in sodium chloride 0.9 % 100 mL (1 Units/mL) infusion   Intravenous To OR Grace Isaac, MD      . magnesium sulfate (IV Push/IM) injection 40 mEq  40 mEq Other To OR Grace Isaac, MD      . milrinone (PRIMACOR) 20 MG/100 ML (0.2 mg/mL) infusion  0.3 mcg/kg/min Intravenous To OR Grace Isaac, MD      . nitroGLYCERIN 50 mg in dextrose 5 % 250 mL (0.2 mg/mL) infusion  2-200 mcg/min Intravenous To OR Grace Isaac, MD      . phenylephrine (NEO-SYNEPHRINE) 20 mg in sodium chloride 0.9 % 250 mL (0.08 mg/mL) infusion  30-200 mcg/min Intravenous To OR Grace Isaac, MD      . potassium chloride injection 80 mEq  80 mEq Other To OR Grace Isaac, MD      . tranexamic acid (CYKLOKAPRON) 2,500 mg in sodium chloride 0.9 % 250 mL (10 mg/mL) infusion  1.5 mg/kg/hr Intravenous To OR Grace Isaac, MD      . tranexamic acid (CYKLOKAPRON) bolus via infusion - over 30 minutes 1,440 mg  15 mg/kg Intravenous To OR Grace Isaac, MD      . tranexamic acid (CYKLOKAPRON) pump prime solution  192 mg  2 mg/kg Intracatheter To OR Grace Isaac, MD      . vancomycin (VANCOCIN) 1,500 mg in sodium chloride 0.9 % 250 mL IVPB  1,500 mg Intravenous To OR Grace Isaac, MD       Facility-Administered Medications Ordered in Other Encounters  Medication Dose Route Frequency Provider Last Rate Last Dose  . fentaNYL (SUBLIMAZE) injection   Intravenous Anesthesia Intra-op Carney Living, CRNA   50 mcg at 05/17/18 0649  . lactated ringers infusion    Continuous PRN Mueller, Dorina Hoyer, CRNA      . lactated ringers infusion    Continuous PRN Mueller, Dorina Hoyer, CRNA      . midazolam (VERSED) 5 MG/5ML injection    Anesthesia Intra-op Carney Living, CRNA   2 mg at 05/17/18 6568      Review of Systems:     Cardiac Review of Systems: [Y] = yes  or   [ N ] = no   Chest Pain [ y   ]  Resting SOB [ n] Exertional SOB  [  y]  Orthopnea [  y]   Pedal Edema [ n  ]    Palpitations [ n ] Syncope  [  n]   Presyncope [n   ]   General Review of Systems: [Y] = yes [  ]=no Constitional: recent weight change [  ];  Wt loss over  the last 3 months [   ] anorexia [  ]; fatigue [  ]; nausea [  ]; night sweats [  ]; fever [  ]; or chills [  ];           Eye : blurred vision [  ]; diplopia [   ]; vision changes [  ];  Amaurosis fugax[  ]; Resp: cough [  ];  wheezing[  ];  hemoptysis[  ]; shortness of breath[  ]; paroxysmal nocturnal dyspnea[  ]; dyspnea on exertion[  ]; or orthopnea[  ];  GI:  gallstones[  ], vomiting[  ];  dysphagia[  ]; melena[  ];  hematochezia [  ]; heartburn[  ];   Hx of  Colonoscopy[  ]; GU: kidney stones [  ]; hematuria[  ];   dysuria [  ];  nocturia[  ];  history of     obstruction [  ]; urinary frequency [  ]             Skin: rash, swelling[  ];, hair loss[  ];  peripheral edema[  ];  or itching[  ]; Musculosketetal: myalgias[ y ];  joint swelling[y  ];  joint erythema[  ];  joint pain[  ];  back pain[  ];  Heme/Lymph: bruising[  ];  bleeding[  ];  anemia[  ];  Neuro: TIA[  ];   headaches[  ];  stroke[  ];  vertigo[  ];  seizures[  ];   paresthesias[  ];  difficulty walking[  ];  Psych:depression[  ]; anxiety[  ];  Endocrine: diabetes[  ];  thyroid dysfunction[  ];  Immunizations: Flu up to date [ y ]; Pneumococcal up to date Blue.Reese  ];  Other: She had recent dental checkup and recent bridge.   PHYSICAL EXAMINATION: BP 139/66   Pulse 66   Temp 97.8 F (36.6 C) (Oral)   Resp 18   Ht 5' 2.5" (1.588 m)   Wt 96 kg   SpO2 98%   BMI 38.09 kg/m  General appearance: alert, cooperative, appears stated age and no distress Head: Normocephalic, without obvious abnormality, atraumatic Neck: no adenopathy, no carotid bruit, no JVD, supple, symmetrical, trachea midline and thyroid not enlarged, symmetric, no tenderness/mass/nodules Lymph nodes: Cervical, supraclavicular, and axillary nodes normal. Resp: clear to auscultation bilaterally Back: symmetric, no curvature. ROM normal. No CVA tenderness. Cardio: regular rate and rhythm and systolic murmur: early systolic 3/6, crescendo throughout the precordium GI: soft, non-tender; bowel sounds normal; no masses,  no organomegaly Extremities: extremities normal, atraumatic, no cyanosis or edema and Homans sign is negative, no sign of DVT Neurologic: Grossly normal  Diagnostic Studies & Laboratory data:     Recent Radiology Findings:   Dg Chest 2 View  Result Date: 05/12/2018 CLINICAL DATA:  Severe aortic stenosis, coronary artery disease, type II diabetes mellitus, former smoker EXAM: CHEST - 2 VIEW COMPARISON:  12/13/2015 FINDINGS: Upper normal heart size. Mediastinal contours and pulmonary vascularity normal. Lungs clear. No pulmonary infiltrate, pleural effusion or pneumothorax. Bones unremarkable. IMPRESSION: No acute abnormalities. Electronically Signed   By: Lavonia Dana M.D.   On: 05/12/2018 16:40    CLINICAL DATA:  History of bicuspid aortic valve. Evaluate for thoracic aortic aneurysm  EXAM: CT ANGIOGRAPHY CHEST WITH  CONTRAST  TECHNIQUE: Multidetector CT imaging of the chest was performed using the standard protocol during bolus administration of intravenous contrast. Multiplanar CT image reconstructions and MIPs were obtained to evaluate the vascular anatomy.  CONTRAST:  84mL ISOVUE-370 IOPAMIDOL (ISOVUE-370) INJECTION 76%  COMPARISON:  None.  FINDINGS: Vascular Findings:  No evidence of thoracic aortic aneurysm with measurements as follows. No evidence of thoracic aortic dissection or periaortic stranding on this nongated examination.  Conventional configuration of the aortic arch. The branch vessels of the aortic arch appear widely patent.  Note is made of a aortic nipple.  Cardiomegaly. Coronary artery calcifications. Post coronary stent placement within the proximal RCA. No pericardial effusion. Exuberant calcifications within the aortic leaflets compatible provided history of aortic stenosis.  Although this examination was not tailored for the evaluation the pulmonary arteries, there are no discrete filling defects within the central pulmonary arterial tree to suggest central pulmonary embolism. Normal caliber the main pulmonary artery.  -------------------------------------------------------------  Thoracic aortic measurements:  Sinotubular junction  26 mm as measured in greatest oblique coronal dimension.  Proximal ascending aorta  33 mm as measured in greatest oblique axial dimension at the level of the main pulmonary artery (image 40, series 7) and approximately 34 mm in greatest oblique short axis coronal diameter (coronal image 81, series 8).  Aortic arch aorta  25 mm as measured in greatest oblique sagittal dimension.  Proximal descending thoracic aorta  23 mm as measured in greatest oblique axial dimension at the level of the main pulmonary artery.  Distal descending thoracic aorta  22 mm as measured in greatest oblique axial dimension  at the level of the diaphragmatic hiatus.  Review of the MIP images confirms the above findings.  -------------------------------------------------------------  Non-Vascular Findings:  Mediastinum/Lymph Nodes: No bulky mediastinal, hilar axillary lymphadenopathy. There is a minimal amount of ill-defined soft tissue in the anterior mediastinum favored to represent residual thymic tissue. No associated mass effect.  Lungs/Pleura: Minimal dependent subpleural ground-glass atelectasis. There is a approximately 1.4 x 1.2 cm ground-glass nodular opacity within the subpleural aspect the left upper lobe (image 40, series 14).  No discrete focal airspace opacities. No pleural effusion or pneumothorax. The central pulmonary airways appear widely patent.  Upper abdomen: Limited early arterial phase evaluation of the upper abdomen demonstrates diffuse decreased attenuation of the hepatic parenchyma compatible with hepatic steatosis.  Musculoskeletal: No acute or aggressive osseous abnormalities. Stigmata of DISH within the thoracic spine.  Regional soft tissues appear normal. Normal appearance of the thyroid gland.  IMPRESSION: 1. No evidence of thoracic aortic aneurysm. 2. Exuberant calcifications within the aortic leaflets compatible with provided history of aortic stenosis. 3. Cardiomegaly. 4. Coronary artery calcifications, post coronary stent placement. Aortic Atherosclerosis (ICD10-I70.0). 5. **An incidental finding of potential clinical significance has been found. Approximately 1.5 cm ground-glass nodular opacity within the left upper lobe. Correlation with prior outside examinations (if available) is advised. Otherwise, initial follow-up with CT at 6-12 months is recommended to confirm persistence. If persistent, repeat CT is recommended every 2 years until 5 years of stability has been established. This recommendation follows the consensus statement: Guidelines  for Management of Incidental Pulmonary Nodules Detected on CT Images: From the Fleischner Society 2017; Radiology 2017; 284:228-243.**   Electronically Signed   By: Sandi Mariscal M.D.   On: 04/15/2018 13:16   I have independently reviewed the above radiology studies  and reviewed the findings with the patient.   Recent Lab Findings: Lab Results  Component Value Date   WBC 6.6 05/12/2018   HGB 12.0 05/12/2018   HCT 37.5 05/12/2018   PLT 191 05/12/2018   GLUCOSE 162 (H) 05/12/2018   CHOL 153 05/11/2017   TRIG 281 (H) 05/11/2017  HDL 29 (L) 05/11/2017   LDLCALC 68 05/11/2017   ALT 19 05/12/2018   AST 17 05/12/2018   NA 140 05/12/2018   K 4.2 05/12/2018   CL 105 05/12/2018   CREATININE 0.94 05/12/2018   BUN 17 05/12/2018   CO2 21 (L) 05/12/2018   INR 1.08 05/12/2018   HGBA1C 9.3 (H) 05/12/2018   CATH: Conclusion    Calcific aortic stenosis, moderately severe with calculated aortic valve area 0.92 cm.  Peak to peak gradient 45 mmHg.  Two-vessel coronary artery disease with 70% mid LAD stenosis and 50% in-stent restenosis in the right coronary within the region of overlapping double layer stent.  Widely patent circumflex artery.  Normal left main.  Normal pulmonary pressures and left ventricular hemodynamics.  Limiting exertional dyspnea secondary to aortic stenosis predominantly with contribution from CAD.  Recommendations:   Aortic valve replacement  LIMA to LAD and SVG to RCA  Patient has an appointment to see Dr. Servando Snare.   ECHO Transthoracic Echocardiography  Patient:    Cassadi, Purdie MR #:       [id number] Study Date: 03/16/2018 Gender:     F Age:        35 Height:     160 cm Weight:     96.2 kg BSA:        2.11 m^2 Pt. Status: Room:   ATTENDING    Belva Crome, MD  Livia Snellen     Belva Crome, MD  REFERRING    Belva Crome, MD  SONOGRAPHER  Oletta Lamas, Will  PERFORMING   [facility],  Outpatient  cc:  ------------------------------------------------------------------- LV EF: 60% -   65%  ------------------------------------------------------------------- Indications:      (I35.0).  ------------------------------------------------------------------- History:   PMH:  Acquired from the patient and from the patient&'s chart.  Dyspnea.  Aortic stenosis.  Risk factors:  Former tobacco use. Diabetes mellitus. Dyslipidemia.  ------------------------------------------------------------------- Study Conclusions  - Left ventricle: The cavity size was normal. Wall thickness was   increased in a pattern of mild LVH. Systolic function was normal.   The estimated ejection fraction was in the range of 60% to 65%.   Wall motion was normal; there were no regional wall motion   abnormalities. Doppler parameters are consistent with abnormal   left ventricular relaxation (grade 1 diastolic dysfunction). - Aortic valve: Valve mobility was restricted. There was severe   stenosis. There was trivial regurgitation. - Mitral valve: Calcified annulus. Mildly thickened leaflets .  Impressions:  - Normal LV systolic function; mild diastolic dysfunction; mild   LVH; calcified aortic valve with severe AS (mean gradient 41   mmHg) and trace AI.  ------------------------------------------------------------------- Study data:  Comparison was made to the study of 03/15/2017.  Study status:  Routine.  Procedure:  The patient reported no pain pre or post test. Transthoracic echocardiography for left ventricular function evaluation and for assessment of valvular function. Image quality was adequate.  Study completion:  There were no complications.          Transthoracic echocardiography.  M-mode, complete 2D, spectral Doppler, and color Doppler.  Birthdate: Patient birthdate: 1955/11/27.  Age:  Patient is 62 yr old.  Sex: Gender: female.    BMI: 37.6 kg/m^2.  Blood pressure:      110/70 Patient status:  Outpatient.  Study date:  Study date: 03/16/2018. Study time: 10:49 AM.  Location:  [place] [facility]  -------------------------------------------------------------------  ------------------------------------------------------------------- Left ventricle:  The cavity size was normal. Wall thickness was increased  in a pattern of mild LVH. Systolic function was normal. The estimated ejection fraction was in the range of 60% to 65%. Wall motion was normal; there were no regional wall motion abnormalities. Doppler parameters are consistent with abnormal left ventricular relaxation (grade 1 diastolic dysfunction).  ------------------------------------------------------------------- Aortic valve:  Poorly visualized.  Moderately calcified leaflets. Valve mobility was restricted.  Doppler:   There was severe stenosis.   There was trivial regurgitation.    VTI ratio of LVOT to aortic valve: 0.25. Valve area (VTI): 0.88 cm^2. Indexed valve area (VTI): 0.42 cm^2/m^2. Peak velocity ratio of LVOT to aortic valve: 0.23. Valve area (Vmax): 0.79 cm^2. Indexed valve area (Vmax): 0.37 cm^2/m^2. Mean velocity ratio of LVOT to aortic valve: 0.24. Valve area (Vmean): 0.84 cm^2. Indexed valve area (Vmean): 0.4 cm^2/m^2.    Mean gradient (S): 41 mm Hg. Peak gradient (S): 76 mm Hg.  ------------------------------------------------------------------- Aorta:  Aortic root: The aortic root was normal in size.  ------------------------------------------------------------------- Mitral valve:   Calcified annulus. Mildly thickened leaflets . Mobility was not restricted.  Doppler:  Transvalvular velocity was within the normal range. There was no evidence for stenosis. There was trivial regurgitation.    Peak gradient (D): 2 mm Hg.  ------------------------------------------------------------------- Left atrium:  The atrium was normal in  size.  ------------------------------------------------------------------- Right ventricle:  The cavity size was normal. Systolic function was normal.  ------------------------------------------------------------------- Pulmonic valve:    Doppler:  Transvalvular velocity was within the normal range. There was no evidence for stenosis.  ------------------------------------------------------------------- Tricuspid valve:   Structurally normal valve.    Doppler: Transvalvular velocity was within the normal range. There was trivial regurgitation.  ------------------------------------------------------------------- Right atrium:  The atrium was normal in size.  ------------------------------------------------------------------- Pericardium:  There was no pericardial effusion.  ------------------------------------------------------------------- Measurements   Left ventricle                            Value          Reference  LV ID, ED, PLAX chordal                   44.3  mm       43 - 52  LV ID, ES, PLAX chordal                   27.1  mm       23 - 38  LV fx shortening, PLAX chordal            39    %        >=29  LV PW thickness, ED                       13.3  mm       ---------  IVS/LV PW ratio, ED                       0.98           <=1.3  Stroke volume, 2D                         86    ml       ---------  Stroke volume/bsa, 2D                     41    ml/m^2   ---------  LV e&',  lateral                            5.46  cm/s     ---------  LV E/e&', lateral                          13.39          ---------  LV e&', medial                             4.87  cm/s     ---------  LV E/e&', medial                           15.01          ---------  LV e&', average                            5.17  cm/s     ---------  LV E/e&', average                          14.15          ---------    Ventricular septum                        Value          Reference  IVS thickness, ED                          13    mm       ---------    LVOT                                      Value          Reference  LVOT ID, S                                21    mm       ---------  LVOT area                                 3.46  cm^2     ---------  LVOT ID                                   21    mm       ---------  LVOT peak velocity, S                     99.3  cm/s     ---------  LVOT mean velocity, S                     73    cm/s     ---------  LVOT VTI, S  24.9  cm       ---------  LVOT peak gradient, S                     4     mm Hg    ---------  Stroke volume (SV), LVOT DP               86.2  ml       ---------  Stroke index (SV/bsa), LVOT DP            40.8  ml/m^2   ---------    Aortic valve                              Value          Reference  Aortic valve peak velocity, S             437   cm/s     ---------  Aortic valve mean velocity, S             300   cm/s     ---------  Aortic valve VTI, S                       98    cm       ---------  Aortic mean gradient, S                   41    mm Hg    ---------  Aortic peak gradient, S                   76    mm Hg    ---------  VTI ratio, LVOT/AV                        0.25           ---------  Aortic valve area, VTI                    0.88  cm^2     ---------  Aortic valve area/bsa, VTI                0.42  cm^2/m^2 ---------  Velocity ratio, peak, LVOT/AV             0.23           ---------  Aortic valve area, peak velocity          0.79  cm^2     ---------  Aortic valve area/bsa, peak               0.37  cm^2/m^2 ---------  velocity  Velocity ratio, mean, LVOT/AV             0.24           ---------  Aortic valve area, mean velocity          0.84  cm^2     ---------  Aortic valve area/bsa, mean               0.4   cm^2/m^2 ---------  velocity    Aorta                                     Value  Reference  Aortic root ID, ED                        34    mm       ---------   Ascending aorta ID, A-P, S                31    mm       ---------    Left atrium                               Value          Reference  LA ID, A-P, ES                            41    mm       ---------  LA ID/bsa, A-P                            1.94  cm/m^2   <=2.2  LA volume, S                              64    ml       ---------  LA volume/bsa, S                          30.3  ml/m^2   ---------  LA volume, ES, 1-p A4C                    63    ml       ---------  LA volume/bsa, ES, 1-p A4C                29.8  ml/m^2   ---------  LA volume, ES, 1-p A2C                    59    ml       ---------  LA volume/bsa, ES, 1-p A2C                27.9  ml/m^2   ---------    Mitral valve                              Value          Reference  Mitral E-wave peak velocity               73.1  cm/s     ---------  Mitral A-wave peak velocity               128   cm/s     ---------  Mitral deceleration time          (H)     264   ms       150 - 230  Mitral peak gradient, D                   2     mm Hg    ---------  Mitral E/A ratio, peak                    0.6            ---------  Systemic veins                            Value          Reference  Estimated CVP                             3     mm Hg    ---------    Right ventricle                           Value          Reference  RV s&', lateral, S                         18.4  cm/s     ---------  Legend: (L)  and  (H)  mark values outside specified reference range.  ------------------------------------------------------------------- Prepared and Electronically Authenticated by  Kirk Ruths 2019-07-10T14:58:39   Assessment / Plan:   #1 symptomatic aortic stenosis #2 coronary artery disease, with residual disease in the stented right coronary artery and LAD #3 overall preserved LV function #4 incidental finding of potential clinical significance has been found. Approximately 1.5 cm ground-glass nodular opacity within the left  upper lobe. 35 UTI treated last week   With the patient's symptomatic aortic stenosis agree with recommendation to proceed with aortic valve replacement and coronary artery bypass grafting to the LAD and right coronary artery.  Risks and options of surgery were discussed with the patient in detail.  We also discussed use of mechanical versus tissue valves.  With her chronic back pain and joint discomfort she preferred a tissue valve to the mechanical valve requiring long-term Coumadin anticoagulation.    The goals risks and alternatives of the planned surgical procedure Procedure(s): CORONARY ARTERY BYPASS GRAFTING (CABG) (N/A) AORTIC VALVE REPLACEMENT (AVR) (N/A) TRANSESOPHAGEAL ECHOCARDIOGRAM (TEE) (N/A)  have been discussed with the patient in detail. The risks of the procedure including death, infection, stroke, myocardial infarction, bleeding, blood transfusion, heart block needing pacer  have all been discussed specifically.  I have quoted [name] a 3 % of perioperative mortality and a complication rate as high as 40 %. The patient's questions have been answered.CHERENE DOBBINS is willing  to proceed with the planned procedure.   Grace Isaac MD      [address] ,[place] [address] Office [phone]   Beeper [phone]  05/17/2018 7:19 AM

## 2018-05-17 NOTE — Anesthesia Preprocedure Evaluation (Addendum)
Anesthesia Evaluation  Patient identified by MRN, date of birth, ID band Patient awake    Reviewed: Allergy & Precautions, NPO status , Patient's Chart, lab work & pertinent test results, reviewed documented beta blocker date and time   Airway Mallampati: II  TM Distance: >3 FB Neck ROM: Full    Dental  (+) Teeth Intact, Dental Advisory Given   Pulmonary former smoker,    Pulmonary exam normal        Cardiovascular hypertension, Pt. on medications + angina + CAD, + Past MI and + DOE  + Valvular Problems/Murmurs AS  Rhythm:Regular Rate:Normal + Systolic murmurs ECG: NSR, rate 70  CATH: Calcific aortic stenosis, moderately severe with calculated aortic valve area 0.92 cm. Peak to peak gradient 45 mmHg. Two-vessel coronary artery disease with 70% mid LAD stenosis and 50% in-stent restenosis in the right coronary within the region of overlapping double layer stent. Widely patent circumflex artery. Normal left main. Normal pulmonary pressures and left ventricular hemodynamics. Limiting exertional dyspnea secondary to aortic stenosis predominantly with contribution from CAD.  ECHO: Normal LV systolic function; mild diastolic dysfunction; mild LVH; calcified aortic valve with severe AS (mean gradient 46mmHg) and trace AI.   Neuro/Psych PSYCHIATRIC DISORDERS Depression negative neurological ROS     GI/Hepatic negative GI ROS, Neg liver ROS,   Endo/Other  diabetes, Type 2, Oral Hypoglycemic Agents  Renal/GU negative Renal ROS     Musculoskeletal  (+) Arthritis , Osteoarthritis,    Abdominal (+) + obese,   Peds  Hematology HLD   Anesthesia Other Findings   Reproductive/Obstetrics                          Anesthesia Physical Anesthesia Plan  ASA: IV  Anesthesia Plan: General   Post-op Pain Management:    Induction: Intravenous  PONV Risk Score and Plan: 3 and Midazolam and Treatment may  vary due to age or medical condition  Airway Management Planned: Oral ETT  Additional Equipment: Arterial line, CVP, PA Cath, TEE and Ultrasound Guidance Line Placement  Intra-op Plan:   Post-operative Plan: Post-operative intubation/ventilation  Informed Consent: I have reviewed the patients History and Physical, chart, labs and discussed the procedure including the risks, benefits and alternatives for the proposed anesthesia with the patient or authorized representative who has indicated his/her understanding and acceptance.   Dental advisory given  Plan Discussed with: CRNA, Anesthesiologist and Surgeon  Anesthesia Plan Comments:        Anesthesia Quick Evaluation

## 2018-05-17 NOTE — Brief Op Note (Signed)
      [address]       [place] [address]             [phone]      05/17/2018  2:44 PM  PATIENT:  Sarah Stevenson  62 y.o. 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 LIMA TO LAD. SVG TO RCA (N/A) AORTIC VALVE REPLACEMENT (AVR) USING 21 MM MAGNA EASE PERICARDIAL BIOPROSTHESIS. MODEL # [id number]. SERIAL # [id number] (N/A) TRANSESOPHAGEAL ECHOCARDIOGRAM (TEE) (N/A) LEFT UPPER LOBE LUNG WEDGE RESECTION (Left)  SURGEON:  Surgeon(s) and Role:    * Grace Isaac, MD - Primary  PHYSICIAN ASSISTANT:  Nicholes Rough, PA-C  ANESTHESIA:   general  EBL:  660 mL   BLOOD ADMINISTERED:none  DRAINS: ROUTINE    LOCAL MEDICATIONS USED:  NONE  SPECIMEN:  Source of Specimen:  LEFT UPPER LOBE WEDGE RESECTION, AORTIC VALVE LEAFELETS  DISPOSITION OF SPECIMEN:  PATHOLOGY  COUNTS:  YES  DICTATION: .Dragon Dictation  PLAN OF CARE: Admit to inpatient   PATIENT DISPOSITION:  ICU - intubated and hemodynamically stable.   Delay start of Pharmacological VTE agent (>24hrs) due to surgical blood loss or risk of bleeding: yes

## 2018-05-17 NOTE — Anesthesia Procedure Notes (Signed)
Procedure Name: Intubation Date/Time: 05/17/2018 7:52 AM Performed by: Carney Living, CRNA Pre-anesthesia Checklist: Patient identified, Emergency Drugs available, Suction available, Patient being monitored and Timeout performed Patient Re-evaluated:Patient Re-evaluated prior to induction Oxygen Delivery Method: Circle system utilized Preoxygenation: Pre-oxygenation with 100% oxygen Induction Type: IV induction Ventilation: Mask ventilation without difficulty and Oral airway inserted - appropriate to patient size Laryngoscope Size: Mac and 4 Grade View: Grade I Tube type: Oral Tube size: 8.0 mm Number of attempts: 1 Airway Equipment and Method: Stylet Placement Confirmation: ETT inserted through vocal cords under direct vision,  positive ETCO2 and breath sounds checked- equal and bilateral Secured at: 22 cm Tube secured with: Tape Dental Injury: Teeth and Oropharynx as per pre-operative assessment

## 2018-05-17 NOTE — Procedures (Signed)
Extubation Procedure Note  Patient Details:   Name: Sarah Stevenson DOB: 28-Nov-1955 MRN: [id number]   Airway Documentation:    Vent end date: 05/17/18 Vent end time: 1730   Evaluation  O2 sats: stable throughout Complications: No apparent complications Patient did tolerate procedure well. Bilateral Breath Sounds: Clear, Diminished   Yes   Patient extubated per protocol to 4L [place] with no apparent complications. Positive cuff leak was noted prior to extubation. Patient achieved NIF of -20 and VC of .85L prior to extubation. Patient is alert and oriented to place and is able to speak. Vitals are stable. RT will continue to monitor.   Sarah Stevenson 05/17/2018, 6:09 PM

## 2018-05-17 NOTE — Anesthesia Procedure Notes (Signed)
Central Venous Catheter Insertion Performed by: Nolon Nations, MD, anesthesiologist Start/End9/06/2018 6:42 AM, 05/17/2018 6:56 AM Patient location: Pre-op. Preanesthetic checklist: patient identified, IV checked, site marked, risks and benefits discussed, surgical consent, monitors and equipment checked, pre-op evaluation, timeout performed and anesthesia consent Position: Trendelenburg Lidocaine 1% used for infiltration and patient sedated Hand hygiene performed  and maximum sterile barriers used  Catheter size: 8.5 Fr Central line was placed.Sheath introducer Procedure performed using ultrasound guided technique. Ultrasound Notes:anatomy identified, needle tip was noted to be adjacent to the nerve/plexus identified, no ultrasound evidence of intravascular and/or intraneural injection and image(s) printed for medical record Attempts: 1 Following insertion, line sutured, dressing applied and Biopatch. Post procedure assessment: blood return through all ports, free fluid flow and no air  Patient tolerated the procedure well with no immediate complications.

## 2018-05-17 NOTE — Anesthesia Procedure Notes (Signed)
Arterial Line Insertion Start/End9/06/2018 7:10 AM, 05/17/2018 7:20 AM Performed by: Carney Living, CRNA, CRNA  Patient location: Pre-op. Patient sedated Left was placed Catheter size: 20 G  Procedure performed without using ultrasound guided technique. Following insertion, dressing applied and Biopatch. Post procedure assessment: normal and unchanged  Patient tolerated the procedure well with no immediate complications.

## 2018-05-17 NOTE — Progress Notes (Signed)
CT surgery p.m. Rounds  Patient doing well after AVR CABG and wedge resection left upper lobe Extubated breathing comfortably Minimal chest tube drainage, no air leak noted Paced rhythm Postop labs satisfactory-1 AMp  bicarb for base deficit -7

## 2018-05-17 NOTE — Progress Notes (Signed)
Per rapid weaning protocol

## 2018-05-17 NOTE — Transfer of Care (Signed)
Immediate Anesthesia Transfer of Care Note  Patient: Sarah Stevenson  Procedure(s) Performed: CORONARY ARTERY BYPASS GRAFTING (CABG) X 2 WITH ENDOSCOPIC HARVESTING OF RIGHT SAPHENOUS VEIN. LIMA TO LAD. SVG TO RCA (N/A Chest) AORTIC VALVE REPLACEMENT (AVR) USING 21 MM MAGNA EASE PERICARDIAL BIOPROSTHESIS. MODEL # [id number]. SERIAL # [id number] (N/A ) TRANSESOPHAGEAL ECHOCARDIOGRAM (TEE) (N/A ) LEFT UPPER LOBE LUNG WEDGE RESECTION (Left )  Patient Location: SICU  Anesthesia Type:General  Level of Consciousness: sedated and Patient remains intubated per anesthesia plan  Airway & Oxygen Therapy: Patient remains intubated per anesthesia plan and Patient placed on Ventilator (see vital sign flow sheet for setting)  Post-op Assessment: Post -op Vital signs reviewed and stable  Post vital signs: Reviewed and stable  Last Vitals:  Vitals Value Taken Time  BP 92/70 05/17/2018  2:45 PM  Temp 35.9 C 05/17/2018  2:45 PM  Pulse 90 05/17/2018  2:45 PM  Resp 21 05/17/2018  2:45 PM  SpO2 96 % 05/17/2018  2:45 PM  Vitals shown include unvalidated device data.  Last Pain:  Vitals:   05/17/18 0619  TempSrc: Oral  PainSc: 1       Patients Stated Pain Goal: 2 (18/56/31 4970)  Complications: No apparent anesthesia complications

## 2018-05-17 NOTE — Progress Notes (Signed)
After using CHG soap, patient had itching and redness.  Patient used wash cloths to wash off soap that had been used and itching and redness was relieved.   No CHG cloths used morning of surgery.

## 2018-05-18 ENCOUNTER — Encounter (HOSPITAL_COMMUNITY): Payer: Self-pay | Admitting: Cardiothoracic Surgery

## 2018-05-18 ENCOUNTER — Inpatient Hospital Stay (HOSPITAL_COMMUNITY): Payer: 59

## 2018-05-18 LAB — GLUCOSE, CAPILLARY
Glucose-Capillary: 111 mg/dL — ABNORMAL HIGH (ref 70–99)
Glucose-Capillary: 94 mg/dL (ref 70–99)
Glucose-Capillary: 111 mg/dL — ABNORMAL HIGH (ref 70–99)
Glucose-Capillary: 99 mg/dL (ref 70–99)
Glucose-Capillary: 97 mg/dL (ref 70–99)
Glucose-Capillary: 106 mg/dL — ABNORMAL HIGH (ref 70–99)
Glucose-Capillary: 109 mg/dL — ABNORMAL HIGH (ref 70–99)
Glucose-Capillary: 98 mg/dL (ref 70–99)
Glucose-Capillary: 104 mg/dL — ABNORMAL HIGH (ref 70–99)
Glucose-Capillary: 113 mg/dL — ABNORMAL HIGH (ref 70–99)
Glucose-Capillary: 111 mg/dL — ABNORMAL HIGH (ref 70–99)
Glucose-Capillary: 118 mg/dL — ABNORMAL HIGH (ref 70–99)
Glucose-Capillary: 134 mg/dL — ABNORMAL HIGH (ref 70–99)
Glucose-Capillary: 135 mg/dL — ABNORMAL HIGH (ref 70–99)
Glucose-Capillary: 136 mg/dL — ABNORMAL HIGH (ref 70–99)
Glucose-Capillary: 145 mg/dL — ABNORMAL HIGH (ref 70–99)

## 2018-05-18 LAB — POCT I-STAT, CHEM 8
BUN: 18 mg/dL (ref 8–23)
Calcium, Ion: 1.12 mmol/L — ABNORMAL LOW (ref 1.15–1.40)
Chloride: 101 mmol/L (ref 98–111)
Creatinine, Ser: 1.3 mg/dL — ABNORMAL HIGH (ref 0.44–1.00)
Glucose, Bld: 160 mg/dL — ABNORMAL HIGH (ref 70–99)
HCT: 28 % — ABNORMAL LOW (ref 36.0–46.0)
Hemoglobin: 9.5 g/dL — ABNORMAL LOW (ref 12.0–15.0)
Potassium: 4.4 mmol/L (ref 3.5–5.1)
Sodium: 134 mmol/L — ABNORMAL LOW (ref 135–145)
TCO2: 20 mmol/L — ABNORMAL LOW (ref 22–32)

## 2018-05-18 LAB — MAGNESIUM
Magnesium: 2.2 mg/dL (ref 1.7–2.4)
Magnesium: 2.3 mg/dL (ref 1.7–2.4)

## 2018-05-18 LAB — CBC
HEMATOCRIT: 28.8 % — AB (ref 36.0–46.0)
Hemoglobin: 9.1 g/dL — ABNORMAL LOW (ref 12.0–15.0)
MCH: 30.7 pg (ref 26.0–34.0)
MCHC: 31.6 g/dL (ref 30.0–36.0)
MCV: 97.3 fL (ref 78.0–100.0)
Platelets: 158 10*3/uL (ref 150–400)
RBC: 2.96 MIL/uL — ABNORMAL LOW (ref 3.87–5.11)
RDW: 12.5 % (ref 11.5–15.5)
WBC: 12 10*3/uL — ABNORMAL HIGH (ref 4.0–10.5)

## 2018-05-18 LAB — BASIC METABOLIC PANEL
Anion gap: 9 (ref 5–15)
BUN: 11 mg/dL (ref 8–23)
CALCIUM: 7.4 mg/dL — AB (ref 8.9–10.3)
CO2: 20 mmol/L — AB (ref 22–32)
CREATININE: 1.04 mg/dL — AB (ref 0.44–1.00)
Chloride: 109 mmol/L (ref 98–111)
GFR calc non Af Amer: 57 mL/min — ABNORMAL LOW (ref >60)
Glucose, Bld: 111 mg/dL — ABNORMAL HIGH (ref 70–99)
Potassium: 4.1 mmol/L (ref 3.5–5.1)
Sodium: 138 mmol/L (ref 135–145)

## 2018-05-18 MED ORDER — INSULIN ASPART 100 UNIT/ML ~~LOC~~ SOLN
0.0000 [IU] | SUBCUTANEOUS | Status: DC
  Administered 2018-05-18 – 2018-05-20 (×8): 2 [IU] via SUBCUTANEOUS

## 2018-05-18 MED ORDER — ENOXAPARIN SODIUM 30 MG/0.3ML ~~LOC~~ SOLN: 30.0000 mg | Freq: Every day | SUBCUTANEOUS | Status: DC

## 2018-05-18 MED ORDER — ENOXAPARIN SODIUM 30 MG/0.3ML ~~LOC~~ SOLN
30.0000 mg | SUBCUTANEOUS | Status: DC
  Administered 2018-05-18 – 2018-05-19 (×2): 30 mg via SUBCUTANEOUS
  Filled 2018-05-18 (×2): qty 0.3

## 2018-05-18 NOTE — Progress Notes (Signed)
Patient ID: Sarah Stevenson, female   DOB: 06-15-1956, 62 y.o.   MRN: [id number] EVENING ROUNDS NOTE :     [address]       ,[place] [address]             [phone]                 1 Day Post-Op Procedure(s) (LRB): CORONARY ARTERY BYPASS GRAFTING (CABG) X 2 WITH ENDOSCOPIC HARVESTING OF RIGHT SAPHENOUS VEIN. LIMA TO LAD. SVG TO RCA (N/A) AORTIC VALVE REPLACEMENT (AVR) USING 21 MM MAGNA EASE PERICARDIAL BIOPROSTHESIS. MODEL # [id number]. SERIAL # [id number] (N/A) TRANSESOPHAGEAL ECHOCARDIOGRAM (TEE) (N/A) LEFT UPPER LOBE LUNG WEDGE RESECTION (Left)  Total Length of Stay:  LOS: 1 day  BP 110/62   Pulse 77   Temp 98 F (36.7 C) (Oral)   Resp 14   Ht 5' 2.5" (1.588 m)   Wt 100.7 kg   SpO2 91%   BMI 39.96 kg/m   .Intake/Output      09/11 0701 - 09/12 0700   P.O.    I.V. (mL/kg) 414.6 (4.1)   Blood    NG/GT    IV Piggyback 100   Total Intake(mL/kg) 514.6 (5.1)   Urine (mL/kg/hr) 225 (0.2)   Emesis/NG output    Blood    Chest Tube 200   Total Output 425   Net +89.6         . sodium chloride 20 mL/hr at 05/18/18 1100  . sodium chloride 250 mL (05/18/18 0506)  . sodium chloride Stopped (05/17/18 1913)  . cefUROXime (ZINACEF)  IV Stopped (05/18/18 0857)  . dexmedetomidine (PRECEDEX) IV infusion Stopped (05/18/18 0458)  . insulin (NOVOLIN-R) infusion Stopped (05/18/18 0203)  . lactated ringers    . lactated ringers    . lactated ringers 20 mL/hr at 05/18/18 1100  . phenylephrine (NEO-SYNEPHRINE) Adult infusion 10 mcg/min (05/18/18 1911)     Lab Results  Component Value Date   WBC 12.0 (H) 05/18/2018   HGB 9.5 (L) 05/18/2018   HCT 28.0 (L) 05/18/2018   PLT 158 05/18/2018   GLUCOSE 160 (H) 05/18/2018   CHOL 153 05/11/2017   TRIG 281 (H) 05/11/2017   HDL 29 (L) 05/11/2017   LDLCALC 68 05/11/2017   ALT 19 05/12/2018   AST 17 05/12/2018   NA 134 (L) 05/18/2018   K 4.4 05/18/2018   CL 101 05/18/2018   CREATININE 1.30 (H) 05/18/2018   BUN 18  05/18/2018   CO2 20 (L) 05/18/2018   INR 1.53 05/17/2018   HGBA1C 9.3 (H) 05/12/2018   Stable day Off drips Lines out    Grace Isaac MD  Beeper [phone] Office [phone] 05/18/2018 8:05 PM

## 2018-05-18 NOTE — Progress Notes (Addendum)
[facility] DAILY ICU PROGRESS NOTE                   [address]            [place] [address]          [phone]   1 Day Post-Op Procedure(s) (LRB): CORONARY ARTERY BYPASS GRAFTING (CABG) X 2 WITH ENDOSCOPIC HARVESTING OF RIGHT SAPHENOUS VEIN. LIMA TO LAD. SVG TO RCA (N/A) AORTIC VALVE REPLACEMENT (AVR) USING 21 MM MAGNA EASE PERICARDIAL BIOPROSTHESIS. MODEL # [id number]. SERIAL # [id number] (N/A) TRANSESOPHAGEAL ECHOCARDIOGRAM (TEE) (N/A) LEFT UPPER LOBE LUNG WEDGE RESECTION (Left)  Total Length of Stay:  LOS: 1 day   Subjective: No issues overnight. She is having a lot of incisional pain this morning. Recently treated with Morphine.   Objective: Vital signs in last 24 hours: Temp:  [96.6 F (35.9 C)-100.6 F (38.1 C)] 99.7 F (37.6 C) (09/11 0700) Pulse Rate:  [82-90] 86 (09/11 0700) Cardiac Rhythm: Normal sinus rhythm (09/11 0400) Resp:  [16-30] 22 (09/11 0700) BP: (73-114)/(50-73) 95/60 (09/11 0700) SpO2:  [93 %-100 %] 93 % (09/11 0700) Arterial Line BP: (88-137)/(43-70) 91/43 (09/11 0700) FiO2 (%):  [40 %-50 %] 40 % (09/10 1704) Weight:  [100.7 kg] 100.7 kg (09/11 0500)  Filed Weights   05/17/18 0619 05/18/18 0500  Weight: 96 kg 100.7 kg    Weight change: 4.717 kg   Hemodynamic parameters for last 24 hours: PAP: (22-39)/(12-26) 36/21 CO:  [2.6 L/min-4.8 L/min] 3.7 L/min CI:  [1.3 L/min/m2-2.5 L/min/m2] 1.9 L/min/m2  Intake/Output from previous day: 09/10 0701 - 09/11 0700 In: 12422.4 [P.O.:480; I.V.:6756.3; Blood:355; IV Piggyback:4831.1] Out: 4275 [Urine:3225; Blood:660; Chest Tube:390]  Intake/Output this shift: No intake/output data recorded.  Current Meds: Scheduled Meds: . acetaminophen  1,000 mg Oral Q6H   Or  . acetaminophen (TYLENOL) oral liquid 160 mg/5 mL  1,000 mg Per Tube Q6H  . docusate sodium  200 mg Oral Daily  . insulin regular  0-10 Units Intravenous TID WC  . mouth rinse  15 mL Mouth Rinse BID  . metoCLOPramide (REGLAN)  injection  10 mg Intravenous Q6H  . metoprolol tartrate  12.5 mg Oral BID   Or  . metoprolol tartrate  12.5 mg Per Tube BID  . [START ON 05/19/2018] pantoprazole  40 mg Oral Daily  . sertraline  25 mg Oral Daily  . sodium chloride flush  3 mL Intravenous Q12H   Continuous Infusions: . sodium chloride 20 mL/hr at 05/18/18 0600  . sodium chloride 250 mL (05/18/18 0506)  . sodium chloride Stopped (05/17/18 1913)  . albumin human 12.5 g (05/17/18 1500)  . cefUROXime (ZINACEF)  IV Stopped (05/17/18 2143)  . dexmedetomidine (PRECEDEX) IV infusion Stopped (05/18/18 0458)  . insulin (NOVOLIN-R) infusion Stopped (05/18/18 0203)  . lactated ringers    . lactated ringers    . lactated ringers 20 mL/hr at 05/18/18 0600  . phenylephrine (NEO-SYNEPHRINE) Adult infusion 20 mcg/min (05/18/18 0600)   PRN Meds:.sodium chloride, albumin human, lactated ringers, metoprolol tartrate, midazolam, morphine injection, ondansetron (ZOFRAN) IV, oxyCODONE, sodium chloride flush, traMADol  General appearance: alert, cooperative and no distress Heart: regular rate and rhythm, S1, S2 normal, no murmur, click, rub or gallop Lungs: clear to auscultation bilaterally and diminshed in bilateral lower lobes Abdomen: soft, non-tender; bowel sounds normal; no masses,  no organomegaly Extremities: bilateral upper extremity edema Wound: clean and dry covered with a sterile dressing  Lab Results: CBC: Recent Labs  05/17/18 2038 05/17/18 2130 05/18/18 0426  WBC 9.6  --  12.0*  HGB 9.7* 9.5* 9.1*  HCT 30.2* 28.0* 28.8*  PLT 152  --  158   BMET:  Recent Labs    05/17/18 2130 05/18/18 0426  NA 140 138  K 4.1 4.1  CL 105 109  CO2  --  20*  GLUCOSE 119* 111*  BUN 13 11  CREATININE 0.80 1.04*  CALCIUM  --  7.4*    CMET: Lab Results  Component Value Date   WBC 12.0 (H) 05/18/2018   HGB 9.1 (L) 05/18/2018   HCT 28.8 (L) 05/18/2018   PLT 158 05/18/2018   GLUCOSE 111 (H) 05/18/2018   CHOL 153 05/11/2017     TRIG 281 (H) 05/11/2017   HDL 29 (L) 05/11/2017   LDLCALC 68 05/11/2017   ALT 19 05/12/2018   AST 17 05/12/2018   NA 138 05/18/2018   K 4.1 05/18/2018   CL 109 05/18/2018   CREATININE 1.04 (H) 05/18/2018   BUN 11 05/18/2018   CO2 20 (L) 05/18/2018   INR 1.53 05/17/2018   HGBA1C 9.3 (H) 05/12/2018      PT/INR:  Recent Labs    05/17/18 1438  LABPROT 18.3*  INR 1.53   Radiology: Dg Chest Port 1 View  Result Date: 05/18/2018 CLINICAL DATA:  Status post aortic valve replacement.  Atelectasis. EXAM: PORTABLE CHEST 1 VIEW COMPARISON:  May 17, 2018 FINDINGS: Endotracheal tube and nasogastric tube have been removed. Swan-Ganz catheter tip is in the right main pulmonary artery proximally. There is a mediastinal drain and a left chest tube. There is no evident pneumothorax. Pacemaker wires are attached to the right heart. There is patchy airspace consolidation in the left upper lobe, stable. There is atelectatic change in the left base with small left pleural effusion. Right lung is clear. Heart is mildly enlarged with pulmonary vascularity normal. Patient is status post coronary artery bypass grafting and aortic valve replacement. IMPRESSION: Tube and catheter positions without evident pneumothorax. Airspace consolidation left upper lobe remains. Small left pleural effusion with left base atelectasis. No new opacity. Stable cardiac silhouette. Electronically Signed   By: Lowella Grip III M.D.   On: 05/18/2018 07:47   Dg Chest Port 1 View  Result Date: 05/17/2018 CLINICAL DATA:  Status post AVR and CABG. Left upper lobe wedge resection. EXAM: PORTABLE CHEST 1 VIEW COMPARISON:  Chest x-ray dated May 12, 2018. FINDINGS: Endotracheal tube in position with the tip 1.5 cm above the level of the carina. Enteric tube entering the stomach with the tip below the field of view. Right internal jugular Swan-Ganz catheter with the tip in the main pulmonary outflow tract. Mediastinal and left  chest tubes are in good position. Interval CABG and AVR. Mild cardiomegaly. Low lung volumes with bronchovascular crowding. Postsurgical changes in the left upper lobe status post wedge resection. Mild increased density in the left upper lobe. Bibasilar atelectasis. Trace bilateral pleural effusions. No pneumothorax. No acute osseous abnormality. IMPRESSION: 1. Appropriately positioned support tubes and apparatus status post CABG and AVR. 2. Postsurgical changes related to left upper lobe wedge resection. Mild increased density in the left upper lobe could reflect postsurgical hemorrhage or edema. 3. Trace bilateral pleural effusions. Electronically Signed   By: Titus Dubin M.D.   On: 05/17/2018 15:02     Assessment/Plan: S/P Procedure(s) (LRB): CORONARY ARTERY BYPASS GRAFTING (CABG) X 2 WITH ENDOSCOPIC HARVESTING OF RIGHT SAPHENOUS VEIN. LIMA TO LAD. SVG TO RCA (N/A) AORTIC  VALVE REPLACEMENT (AVR) USING 21 MM MAGNA EASE PERICARDIAL BIOPROSTHESIS. MODEL # [id number]. SERIAL # [id number] (N/A) TRANSESOPHAGEAL ECHOCARDIOGRAM (TEE) (N/A) LEFT UPPER LOBE LUNG WEDGE RESECTION (Left)  1. CV-NSR in the 80s, MAPs have been 60s-70s. Remains on some Neo-weaning as tolerated.  2. Pulm- CXR this morning showed: Airspace consolidation left upper lobe remains. Small left pleural effusion with left base atelectasis. No new opacity. Stable cardiac silhouette. Chest tube output was 390cc/24 hours. Continue for now.   3. Renal-creatinine 1.04, up slightly from yesterday which was 0.80. Electrolytes okay 4. H and H 9.1/28.8, expected acute blood loss anemia. Stable. 5. Endo-blood glucose level well controlled. Transition from Insulin gtt to SSI.  6. Continue routine post-op antibiotics  Plan: Weaning Neo as tolerated. Will hold off on diuretics until off Neo. Work on pain control. Will try some Toradol if okay with Dr. Servando Snare. She is getting the usual Oxycodone PO and Morphine IV. Continue chest tubes for now.  Possibly up to the chair later today. Tolerating clear liquids-advance as able. Encouraged incentive spirometer use hourly.     Elgie Collard 05/18/2018 8:03 AM    Not significant change in cr Stable post op Discussed lung cancer with patient D/c lines  I have seen and examined [name] and agree with the above assessment  and plan.  Grace Isaac MD Beeper [phone] Office [phone] 05/18/2018 1:46 PM

## 2018-05-19 ENCOUNTER — Inpatient Hospital Stay (HOSPITAL_COMMUNITY): Payer: 59

## 2018-05-19 ENCOUNTER — Other Ambulatory Visit: Payer: Self-pay

## 2018-05-19 LAB — BASIC METABOLIC PANEL
Anion gap: 9 (ref 5–15)
BUN: 19 mg/dL (ref 8–23)
CO2: 20 mmol/L — ABNORMAL LOW (ref 22–32)
Calcium: 7.6 mg/dL — ABNORMAL LOW (ref 8.9–10.3)
Chloride: 104 mmol/L (ref 98–111)
Creatinine, Ser: 1.2 mg/dL — ABNORMAL HIGH (ref 0.44–1.00)
GFR calc Af Amer: 55 mL/min — ABNORMAL LOW (ref >60)
GFR calc non Af Amer: 48 mL/min — ABNORMAL LOW (ref >60)
Glucose, Bld: 116 mg/dL — ABNORMAL HIGH (ref 70–99)
Potassium: 4 mmol/L (ref 3.5–5.1)
Sodium: 133 mmol/L — ABNORMAL LOW (ref 135–145)

## 2018-05-19 LAB — GLUCOSE, CAPILLARY
Glucose-Capillary: 122 mg/dL — ABNORMAL HIGH (ref 70–99)
Glucose-Capillary: 151 mg/dL — ABNORMAL HIGH (ref 70–99)
Glucose-Capillary: 109 mg/dL — ABNORMAL HIGH (ref 70–99)
Glucose-Capillary: 120 mg/dL — ABNORMAL HIGH (ref 70–99)
Glucose-Capillary: 116 mg/dL — ABNORMAL HIGH (ref 70–99)
Glucose-Capillary: 140 mg/dL — ABNORMAL HIGH (ref 70–99)
Glucose-Capillary: 134 mg/dL — ABNORMAL HIGH (ref 70–99)

## 2018-05-19 LAB — CBC
HCT: 28.4 % — ABNORMAL LOW (ref 36.0–46.0)
Hemoglobin: 8.8 g/dL — ABNORMAL LOW (ref 12.0–15.0)
MCH: 30.3 pg (ref 26.0–34.0)
MCHC: 31 g/dL (ref 30.0–36.0)
MCV: 97.9 fL (ref 78.0–100.0)
Platelets: 133 10*3/uL — ABNORMAL LOW (ref 150–400)
RBC: 2.9 MIL/uL — ABNORMAL LOW (ref 3.87–5.11)
RDW: 12.7 % (ref 11.5–15.5)
WBC: 13.2 10*3/uL — ABNORMAL HIGH (ref 4.0–10.5)

## 2018-05-19 LAB — POCT I-STAT 3, ART BLOOD GAS (G3+)
Acid-base deficit: 2 mmol/L (ref 0.0–2.0)
Bicarbonate: 24.1 mmol/L (ref 20.0–28.0)
O2 Saturation: 98 %
Patient temperature: 36.5
TCO2: 25 mmol/L (ref 22–32)
pCO2 arterial: 44.2 mmHg (ref 32.0–48.0)
pH, Arterial: 7.343 — ABNORMAL LOW (ref 7.350–7.450)
pO2, Arterial: 103 mmHg (ref 83.0–108.0)

## 2018-05-19 MED ORDER — LEVALBUTEROL HCL 0.63 MG/3ML IN NEBU
0.6300 mg | INHALATION_SOLUTION | Freq: Three times a day (TID) | RESPIRATORY_TRACT | Status: DC
  Administered 2018-05-19 – 2018-05-22 (×11): 0.63 mg via RESPIRATORY_TRACT
  Filled 2018-05-19 (×11): qty 3

## 2018-05-19 MED ORDER — FUROSEMIDE 10 MG/ML IJ SOLN
40.0000 mg | Freq: Once | INTRAMUSCULAR | Status: AC
  Administered 2018-05-19: 40 mg via INTRAVENOUS
  Filled 2018-05-19: qty 4

## 2018-05-19 MED ORDER — POTASSIUM CHLORIDE CRYS ER 20 MEQ PO TBCR
20.0000 meq | EXTENDED_RELEASE_TABLET | Freq: Once | ORAL | Status: AC
  Administered 2018-05-19: 20 meq via ORAL
  Filled 2018-05-19: qty 1

## 2018-05-19 MED ORDER — FUROSEMIDE 10 MG/ML IJ SOLN
40.0000 mg | Freq: Once | INTRAMUSCULAR | Status: AC
  Administered 2018-05-19: 40 mg via INTRAVENOUS

## 2018-05-19 MED FILL — Electrolyte-R (PH 7.4) Solution: INTRAVENOUS | Qty: 4000 | Status: AC

## 2018-05-19 MED FILL — Sodium Bicarbonate IV Soln 8.4%: INTRAVENOUS | Qty: 50 | Status: AC

## 2018-05-19 MED FILL — Lidocaine HCl(Cardiac) IV PF Soln Pref Syr 100 MG/5ML (2%): INTRAVENOUS | Qty: 5 | Status: AC

## 2018-05-19 MED FILL — Sodium Chloride IV Soln 0.9%: INTRAVENOUS | Qty: 2000 | Status: AC

## 2018-05-19 MED FILL — Mannitol IV Soln 20%: INTRAVENOUS | Qty: 500 | Status: AC

## 2018-05-19 MED FILL — Heparin Sodium (Porcine) Inj 1000 Unit/ML: INTRAMUSCULAR | Qty: 20 | Status: AC

## 2018-05-19 NOTE — Progress Notes (Signed)
      [address]       [place] [address]             [phone]      PM rounds  Resting comfortably  BP (!) 110/56   Pulse 84   Temp 98.2 F (36.8 C) (Oral)   Resp (!) 21   Ht 5' 2.5" (1.588 m)   Wt 101.2 kg   SpO2 97%   BMI 40.16 kg/m   Intake/Output Summary (Last 24 hours) at 05/19/2018 1758 Last data filed at 05/19/2018 1000 Gross per 24 hour  Intake 917.15 ml  Output 1305 ml  Net -387.85 ml   POD # 2 No new issues  Remo Lipps C. Roxan Hockey, MD [facility] [phone]

## 2018-05-19 NOTE — Progress Notes (Addendum)
[address]       [place] [address]             [phone]      2 Days Post-Op Procedure(s) (LRB): CORONARY ARTERY BYPASS GRAFTING (CABG) X 2 WITH ENDOSCOPIC HARVESTING OF RIGHT SAPHENOUS VEIN. LIMA TO LAD. SVG TO RCA (N/A) AORTIC VALVE REPLACEMENT (AVR) USING 21 MM MAGNA EASE PERICARDIAL BIOPROSTHESIS. MODEL # [id number]. SERIAL # [id number] (N/A) TRANSESOPHAGEAL ECHOCARDIOGRAM (TEE) (N/A) LEFT UPPER LOBE LUNG WEDGE RESECTION (Left)   Subjective:  Patient states she is not feeling very well this morning.  She states she has incisional discomfort, a headache, and some nausea.  She thinks it would be better if she had some food in her system.  She is having difficulty coughing sputum up.  She did ambulate this morning, but per nursing she did not go very far.  Objective: Vital signs in last 24 hours: Temp:  [97.7 F (36.5 C)-99.5 F (37.5 C)] 98.1 F (36.7 C) (09/12 0735) Pulse Rate:  [75-86] 80 (09/12 0735) Cardiac Rhythm: Normal sinus rhythm (09/12 0400) Resp:  [14-25] 18 (09/12 0600) BP: (83-119)/(43-84) 104/53 (09/12 0735) SpO2:  [91 %-97 %] 94 % (09/12 0400) Arterial Line BP: (95-134)/(40-58) 130/54 (09/12 0600) Weight:  [101.2 kg] 101.2 kg (09/12 0500)  Hemodynamic parameters for last 24 hours: PAP: (35-47)/(18-23) 37/21  Intake/Output from previous day: 09/11 0701 - 09/12 0700 In: 1351.7 [P.O.:360; I.V.:791.5; IV Piggyback:200.2] Out: 1280 [Urine:1010; Chest Tube:270]  General appearance: alert, cooperative and no distress Heart: regular rate and rhythm Lungs: diminished breath sounds bibasilar Abdomen: soft, non-tender; bowel sounds normal; no masses,  no organomegaly Extremities: edema 1+ pitting edema Wound: clean and dry, aquacel remains in place on sternotomy  Lab Results: Recent Labs    05/18/18 0426 05/18/18 1934 05/19/18 0506  WBC 12.0*  --  13.2*  HGB 9.1* 9.5* 8.8*  HCT 28.8* 28.0* 28.4*  PLT 158  --  133*   BMET:  Recent  Labs    05/18/18 0426 05/18/18 1934 05/19/18 0506  NA 138 134* 133*  K 4.1 4.4 4.0  CL 109 101 104  CO2 20*  --  20*  GLUCOSE 111* 160* 116*  BUN 11 18 19   CREATININE 1.04* 1.30* 1.20*  CALCIUM 7.4*  --  7.6*    PT/INR:  Recent Labs    05/17/18 1438  LABPROT 18.3*  INR 1.53   ABG    Component Value Date/Time   PHART 7.317 (L) 05/17/2018 1833   HCO3 21.3 05/17/2018 1833   TCO2 20 (L) 05/18/2018 1934   ACIDBASEDEF 4.0 (H) 05/17/2018 1833   O2SAT 97.0 05/17/2018 1833   CBG (last 3)  Recent Labs    05/18/18 1643 05/18/18 1953 05/19/18 0056  GLUCAP 136* 151* 122*    Assessment/Plan: S/P Procedure(s) (LRB): CORONARY ARTERY BYPASS GRAFTING (CABG) X 2 WITH ENDOSCOPIC HARVESTING OF RIGHT SAPHENOUS VEIN. LIMA TO LAD. SVG TO RCA (N/A) AORTIC VALVE REPLACEMENT (AVR) USING 21 MM MAGNA EASE PERICARDIAL BIOPROSTHESIS. MODEL # [id number]. SERIAL # [id number] (N/A) TRANSESOPHAGEAL ECHOCARDIOGRAM (TEE) (N/A) LEFT UPPER LOBE LUNG WEDGE RESECTION (Left)  1. CV- NSR, off Neosynephrine- will continue low dose Lopressor 2. Pulm- no air leak on chest tube, will place to water seal, CXR is free from pneumothorax, significant pleural effusion, will continue IS, add Xopenex neb to help with cough 3. Renal- creatinine at 1.20, weight is elevated about 15 lbs, will give a dose of IV Lasix 40 mg  today, supplement K 4. Expected post operative blood loss anemia, mild Hgb at 8.8 5. Expected post operative thrombocytopenia, down to 133 will monitor 6. Dispo- patient not feeling well today, no air leak will place chest tube to water seal, start xopenex for assistance with cough, start diuresis, supplement K, watch Hgb, platelets, d/c arterial line, keep in ICU today   LOS: 2 days    Ellwood Handler 05/19/2018  Patient yesterday  I have seen and examined Sarah Stevenson and agree with the above assessment  and plan.  Grace Isaac MD Beeper [phone] Office [phone] 05/20/2018 7:55 AM

## 2018-05-19 NOTE — Anesthesia Postprocedure Evaluation (Signed)
Anesthesia Post Note  Patient: Sarah Stevenson  Procedure(s) Performed: CORONARY ARTERY BYPASS GRAFTING (CABG) X 2 WITH ENDOSCOPIC HARVESTING OF RIGHT SAPHENOUS VEIN. LIMA TO LAD. SVG TO RCA (N/A Chest) AORTIC VALVE REPLACEMENT (AVR) USING 21 MM MAGNA EASE PERICARDIAL BIOPROSTHESIS. MODEL # [id number]. SERIAL # [id number] (N/A ) TRANSESOPHAGEAL ECHOCARDIOGRAM (TEE) (N/A ) LEFT UPPER LOBE LUNG WEDGE RESECTION (Left )     Patient location during evaluation: ICU Anesthesia Type: General Level of consciousness: awake and alert Pain management: pain level controlled Vital Signs Assessment: post-procedure vital signs reviewed and stable Respiratory status: spontaneous breathing, nonlabored ventilation, respiratory function stable and patient connected to nasal cannula oxygen Cardiovascular status: blood pressure returned to baseline and stable Postop Assessment: no apparent nausea or vomiting Anesthetic complications: no    Last Vitals:  Vitals:   05/19/18 0500 05/19/18 0600  BP:    Pulse:    Resp: (!) 24 18  Temp:    SpO2:      Last Pain:  Vitals:   05/19/18 0440  TempSrc:   PainSc: 10-Worst pain ever                 Robley Matassa P Brandace Cargle

## 2018-05-20 ENCOUNTER — Inpatient Hospital Stay (HOSPITAL_COMMUNITY): Payer: 59

## 2018-05-20 DIAGNOSIS — J9811 Atelectasis: Secondary | ICD-10-CM | POA: Diagnosis not present

## 2018-05-20 DIAGNOSIS — C3412 Malignant neoplasm of upper lobe, left bronchus or lung: Secondary | ICD-10-CM | POA: Diagnosis not present

## 2018-05-20 DIAGNOSIS — E8881 Metabolic syndrome: Secondary | ICD-10-CM | POA: Diagnosis not present

## 2018-05-20 DIAGNOSIS — I350 Nonrheumatic aortic (valve) stenosis: Secondary | ICD-10-CM | POA: Diagnosis not present

## 2018-05-20 DIAGNOSIS — D6959 Other secondary thrombocytopenia: Secondary | ICD-10-CM | POA: Diagnosis not present

## 2018-05-20 DIAGNOSIS — N390 Urinary tract infection, site not specified: Secondary | ICD-10-CM | POA: Diagnosis not present

## 2018-05-20 DIAGNOSIS — Z6838 Body mass index (BMI) 38.0-38.9, adult: Secondary | ICD-10-CM | POA: Diagnosis not present

## 2018-05-20 DIAGNOSIS — D62 Acute posthemorrhagic anemia: Secondary | ICD-10-CM | POA: Diagnosis not present

## 2018-05-20 DIAGNOSIS — T82855A Stenosis of coronary artery stent, initial encounter: Secondary | ICD-10-CM | POA: Diagnosis not present

## 2018-05-20 DIAGNOSIS — C3492 Malignant neoplasm of unspecified part of left bronchus or lung: Secondary | ICD-10-CM | POA: Diagnosis present

## 2018-05-20 LAB — CBC
HEMATOCRIT: 26.5 % — AB (ref 36.0–46.0)
Hemoglobin: 8.3 g/dL — ABNORMAL LOW (ref 12.0–15.0)
MCH: 30.3 pg (ref 26.0–34.0)
MCHC: 31.3 g/dL (ref 30.0–36.0)
MCV: 96.7 fL (ref 78.0–100.0)
Platelets: 126 10*3/uL — ABNORMAL LOW (ref 150–400)
RBC: 2.74 MIL/uL — ABNORMAL LOW (ref 3.87–5.11)
RDW: 12.5 % (ref 11.5–15.5)
WBC: 9.6 10*3/uL (ref 4.0–10.5)

## 2018-05-20 LAB — BASIC METABOLIC PANEL
Anion gap: 7 (ref 5–15)
BUN: 15 mg/dL (ref 8–23)
CALCIUM: 8 mg/dL — AB (ref 8.9–10.3)
CO2: 26 mmol/L (ref 22–32)
Chloride: 106 mmol/L (ref 98–111)
Creatinine, Ser: 0.98 mg/dL (ref 0.44–1.00)
GFR calc Af Amer: >60 mL/min (ref >60)
GLUCOSE: 117 mg/dL — AB (ref 70–99)
Potassium: 3.7 mmol/L (ref 3.5–5.1)
Sodium: 139 mmol/L (ref 135–145)

## 2018-05-20 LAB — GLUCOSE, CAPILLARY
Glucose-Capillary: 106 mg/dL — ABNORMAL HIGH (ref 70–99)
Glucose-Capillary: 131 mg/dL — ABNORMAL HIGH (ref 70–99)
Glucose-Capillary: 132 mg/dL — ABNORMAL HIGH (ref 70–99)
Glucose-Capillary: 134 mg/dL — ABNORMAL HIGH (ref 70–99)
Glucose-Capillary: 138 mg/dL — ABNORMAL HIGH (ref 70–99)
Glucose-Capillary: 148 mg/dL — ABNORMAL HIGH (ref 70–99)

## 2018-05-20 LAB — URINE CULTURE: CULTURE: NO GROWTH

## 2018-05-20 MED ORDER — FLUTICASONE PROPIONATE 50 MCG/ACT NA SUSP
1.0000 | Freq: Every day | NASAL | Status: DC | PRN
  Administered 2018-05-20 – 2018-05-23 (×4): 1 via NASAL
  Filled 2018-05-20 (×2): qty 16

## 2018-05-20 MED ORDER — SODIUM CHLORIDE 0.9% FLUSH
3.0000 mL | Freq: Two times a day (BID) | INTRAVENOUS | Status: DC
  Administered 2018-05-20 – 2018-05-23 (×7): 3 mL via INTRAVENOUS

## 2018-05-20 MED ORDER — TRAMADOL HCL 50 MG PO TABS: 50.0000 mg | ORAL_TABLET | ORAL | Status: DC | PRN

## 2018-05-20 MED ORDER — ONDANSETRON HCL 4 MG/2ML IJ SOLN
4.0000 mg | Freq: Four times a day (QID) | INTRAMUSCULAR | Status: DC | PRN
  Administered 2018-05-23: 4 mg via INTRAVENOUS
  Filled 2018-05-20: qty 2

## 2018-05-20 MED ORDER — LORATADINE 10 MG PO TABS
10.0000 mg | ORAL_TABLET | Freq: Every day | ORAL | Status: DC
  Administered 2018-05-20 – 2018-05-23 (×4): 10 mg via ORAL
  Filled 2018-05-20 (×4): qty 1

## 2018-05-20 MED ORDER — INSULIN ASPART 100 UNIT/ML ~~LOC~~ SOLN
0.0000 [IU] | Freq: Three times a day (TID) | SUBCUTANEOUS | Status: DC
  Administered 2018-05-20 – 2018-05-21 (×4): 2 [IU] via SUBCUTANEOUS
  Administered 2018-05-21: 4 [IU] via SUBCUTANEOUS
  Administered 2018-05-21 – 2018-05-22 (×2): 2 [IU] via SUBCUTANEOUS
  Administered 2018-05-22: 4 [IU] via SUBCUTANEOUS
  Administered 2018-05-23 – 2018-05-24 (×5): 2 [IU] via SUBCUTANEOUS

## 2018-05-20 MED ORDER — BISACODYL 5 MG PO TBEC: 10.0000 mg | DELAYED_RELEASE_TABLET | Freq: Every day | ORAL | Status: DC | PRN

## 2018-05-20 MED ORDER — FAMOTIDINE 20 MG PO TABS
20.0000 mg | ORAL_TABLET | Freq: Two times a day (BID) | ORAL | Status: DC
  Administered 2018-05-20 – 2018-05-24 (×9): 20 mg via ORAL
  Filled 2018-05-20 (×9): qty 1

## 2018-05-20 MED ORDER — GLIPIZIDE ER 10 MG PO TB24
10.0000 mg | ORAL_TABLET | Freq: Every day | ORAL | Status: DC
  Administered 2018-05-20 – 2018-05-24 (×5): 10 mg via ORAL
  Filled 2018-05-20 (×5): qty 1

## 2018-05-20 MED ORDER — POTASSIUM CHLORIDE CRYS ER 20 MEQ PO TBCR
20.0000 meq | EXTENDED_RELEASE_TABLET | ORAL | Status: DC
  Administered 2018-05-20: 20 meq via ORAL
  Filled 2018-05-20: qty 1

## 2018-05-20 MED ORDER — ENOXAPARIN SODIUM 40 MG/0.4ML ~~LOC~~ SOLN
40.0000 mg | SUBCUTANEOUS | Status: DC
  Administered 2018-05-20 – 2018-05-24 (×5): 40 mg via SUBCUTANEOUS
  Filled 2018-05-20 (×5): qty 0.4

## 2018-05-20 MED ORDER — SODIUM CHLORIDE 0.9% FLUSH: 3.0000 mL | INTRAVENOUS | Status: DC | PRN

## 2018-05-20 MED ORDER — ONDANSETRON HCL 4 MG PO TABS: 4.0000 mg | ORAL_TABLET | Freq: Four times a day (QID) | ORAL | Status: DC | PRN

## 2018-05-20 MED ORDER — CARVEDILOL 3.125 MG PO TABS
3.1250 mg | ORAL_TABLET | Freq: Two times a day (BID) | ORAL | Status: DC
  Administered 2018-05-20 – 2018-05-21 (×3): 3.125 mg via ORAL
  Filled 2018-05-20 (×3): qty 1

## 2018-05-20 MED ORDER — ROSUVASTATIN CALCIUM 10 MG PO TABS
20.0000 mg | ORAL_TABLET | ORAL | Status: DC
  Administered 2018-05-20 – 2018-05-23 (×2): 20 mg via ORAL
  Filled 2018-05-20: qty 2
  Filled 2018-05-20: qty 1

## 2018-05-20 MED ORDER — METFORMIN HCL ER 750 MG PO TB24
1500.0000 mg | ORAL_TABLET | Freq: Every day | ORAL | Status: DC
  Administered 2018-05-20 – 2018-05-23 (×4): 1500 mg via ORAL
  Filled 2018-05-20 (×6): qty 2

## 2018-05-20 MED ORDER — MOVING RIGHT ALONG BOOK
Freq: Once | Status: AC
  Administered 2018-05-20: 10:00:00
  Filled 2018-05-20: qty 1

## 2018-05-20 MED ORDER — OXYCODONE HCL 5 MG PO TABS: 5.0000 mg | ORAL_TABLET | ORAL | Status: DC | PRN

## 2018-05-20 MED ORDER — PRASUGREL HCL 10 MG PO TABS
10.0000 mg | ORAL_TABLET | Freq: Every day | ORAL | Status: DC
  Administered 2018-05-20 – 2018-05-24 (×5): 10 mg via ORAL
  Filled 2018-05-20 (×5): qty 1

## 2018-05-20 MED ORDER — OXYCODONE HCL 5 MG PO TABS
5.0000 mg | ORAL_TABLET | ORAL | Status: DC | PRN
  Administered 2018-05-20 – 2018-05-24 (×18): 5 mg via ORAL
  Filled 2018-05-20 (×18): qty 1

## 2018-05-20 MED ORDER — DOCUSATE SODIUM 100 MG PO CAPS
200.0000 mg | ORAL_CAPSULE | Freq: Every day | ORAL | Status: DC
  Administered 2018-05-20 – 2018-05-24 (×5): 200 mg via ORAL
  Filled 2018-05-20 (×5): qty 2

## 2018-05-20 MED ORDER — POTASSIUM CHLORIDE CRYS ER 20 MEQ PO TBCR: 20.0000 meq | EXTENDED_RELEASE_TABLET | Freq: Every day | ORAL | Status: DC

## 2018-05-20 MED ORDER — TRAMADOL HCL 50 MG PO TABS
50.0000 mg | ORAL_TABLET | ORAL | Status: DC | PRN
  Filled 2018-05-20: qty 1

## 2018-05-20 MED ORDER — SODIUM CHLORIDE 0.9 % IV SOLN: 250.0000 mL | INTRAVENOUS | Status: DC | PRN

## 2018-05-20 MED ORDER — BISACODYL 10 MG RE SUPP
10.0000 mg | Freq: Every day | RECTAL | Status: DC | PRN
  Administered 2018-05-23: 10 mg via RECTAL
  Filled 2018-05-20 (×2): qty 1

## 2018-05-20 MED ORDER — FUROSEMIDE 40 MG PO TABS
40.0000 mg | ORAL_TABLET | Freq: Every day | ORAL | Status: DC
  Administered 2018-05-20 – 2018-05-23 (×4): 40 mg via ORAL
  Filled 2018-05-20 (×4): qty 1

## 2018-05-20 MED ORDER — POTASSIUM CHLORIDE CRYS ER 20 MEQ PO TBCR
20.0000 meq | EXTENDED_RELEASE_TABLET | Freq: Every day | ORAL | Status: DC
  Administered 2018-05-20: 20 meq via ORAL
  Filled 2018-05-20: qty 1

## 2018-05-20 MED ORDER — GUAIFENESIN ER 600 MG PO TB12: 600.0000 mg | ORAL_TABLET | Freq: Two times a day (BID) | ORAL | Status: DC | PRN

## 2018-05-20 MED ORDER — POLYVINYL ALCOHOL 1.4 % OP SOLN
1.0000 [drp] | OPHTHALMIC | Status: DC | PRN
  Administered 2018-05-20: 1 [drp] via OPHTHALMIC
  Filled 2018-05-20: qty 15

## 2018-05-20 MED ORDER — ACETAMINOPHEN 325 MG PO TABS
650.0000 mg | ORAL_TABLET | Freq: Four times a day (QID) | ORAL | Status: DC | PRN
  Administered 2018-05-20 – 2018-05-23 (×10): 650 mg via ORAL
  Filled 2018-05-20 (×10): qty 2

## 2018-05-20 MED FILL — Heparin Sodium (Porcine) Inj 1000 Unit/ML: INTRAMUSCULAR | Qty: 30 | Status: AC

## 2018-05-20 MED FILL — Magnesium Sulfate Inj 50%: INTRAMUSCULAR | Qty: 10 | Status: AC

## 2018-05-20 MED FILL — Heparin Sodium (Porcine) Inj 1000 Unit/ML: INTRAMUSCULAR | Qty: 2500 | Status: AC

## 2018-05-20 MED FILL — Potassium Chloride Inj 2 mEq/ML: INTRAVENOUS | Qty: 40 | Status: AC

## 2018-05-20 MED FILL — Dexmedetomidine HCl in NaCl 0.9% IV Soln 400 MCG/100ML: INTRAVENOUS | Qty: 100 | Status: AC

## 2018-05-20 NOTE — Progress Notes (Signed)
Report given to Roselyn Reef, Therapist, sports. Plan of care discussed. Pt resting comfortably in chair, will call bell in reach. Care relinquished at this time.

## 2018-05-20 NOTE — Progress Notes (Signed)
Spoke with patient about her diabetes. Was diagnosed about 10 years ago. Has been on Metformin and Glucotrol. Was on Bydureon for a few weeks, but developed whelps from the medicine. Was then started on Trulicity 1.88 mg weekly for about 1 1/2 years. When the dosage was increased, she had GI issues with it.  Her A1c had been 7%, but she is aware that it had increased. Told her that it was 9.3% now. She is scheduled to see her PCP on Tuesday.  Will continue to monitor blood sugars while in the hospital.  Harvel Ricks RN BSN CDE Diabetes Coordinator Pager: [phone]  8am-5pm

## 2018-05-20 NOTE — Plan of Care (Signed)
  Problem: Education: Goal: Knowledge of disease or condition will improve Outcome: Progressing Goal: Knowledge of the prescribed therapeutic regimen will improve Outcome: Progressing   Problem: Activity: Goal: Risk for activity intolerance will decrease Outcome: Progressing   Problem: Cardiac: Goal: Will achieve and/or maintain hemodynamic stability Outcome: Progressing   Problem: Clinical Measurements: Goal: Postoperative complications will be avoided or minimized Outcome: Progressing   Problem: Respiratory: Goal: Respiratory status will improve Outcome: Progressing   Problem: Skin Integrity: Goal: Wound healing without signs and symptoms of infection Outcome: Progressing   Problem: Urinary Elimination: Goal: Ability to achieve and maintain adequate renal perfusion and functioning will improve Outcome: Progressing   Problem: Education: Goal: Knowledge of General Education information will improve Description Including pain rating scale, medication(s)/side effects and non-pharmacologic comfort measures Outcome: Progressing   Problem: Health Behavior/Discharge Planning: Goal: Ability to manage health-related needs will improve Outcome: Progressing   Problem: Nutrition: Goal: Adequate nutrition will be maintained Outcome: Progressing   Problem: Coping: Goal: Level of anxiety will decrease Outcome: Progressing

## 2018-05-20 NOTE — Progress Notes (Addendum)
[facility] DAILY ICU PROGRESS NOTE                   [address]            [place] [address]          [phone]   3 Days Post-Op Procedure(s) (LRB): CORONARY ARTERY BYPASS GRAFTING (CABG) X 2 WITH ENDOSCOPIC HARVESTING OF RIGHT SAPHENOUS VEIN. LIMA TO LAD. SVG TO RCA (N/A) AORTIC VALVE REPLACEMENT (AVR) USING 21 MM MAGNA EASE PERICARDIAL BIOPROSTHESIS. MODEL # [id number]. SERIAL # [id number] (N/A) TRANSESOPHAGEAL ECHOCARDIOGRAM (TEE) (N/A) LEFT UPPER LOBE LUNG WEDGE RESECTION (Left)  Total Length of Stay:  LOS: 3 days   Subjective: Patient awake alert neurologically intact, sitting in the chair this morning, walked partway around the unit this morning still some shortness of breath with exertion  Objective: Vital signs in last 24 hours: Temp:  [98.1 F (36.7 C)-98.6 F (37 C)] 98.1 F (36.7 C) (09/13 0725) Pulse Rate:  [79-95] 93 (09/13 0700) Cardiac Rhythm: Normal sinus rhythm (09/13 0400) Resp:  [15-25] 23 (09/13 0700) BP: (92-143)/(43-77) 137/63 (09/13 0700) SpO2:  [90 %-99 %] 94 % (09/13 0700) Arterial Line BP: (132-148)/(58-71) 148/71 (09/12 0900) Weight:  [97.5 kg] 97.5 kg (09/13 0500)  Filed Weights   05/18/18 0500 05/19/18 0500 05/20/18 0500  Weight: 100.7 kg 101.2 kg 97.5 kg    Weight change: -3.7 kg   Hemodynamic parameters for last 24 hours:    Intake/Output from previous day: 09/12 0701 - 09/13 0700 In: 1057.5 [P.O.:960; I.V.:97.5] Out: 2550 [Urine:2550]  Intake/Output this shift: No intake/output data recorded.  Current Meds: Scheduled Meds: . acetaminophen  1,000 mg Oral Q6H   Or  . acetaminophen (TYLENOL) oral liquid 160 mg/5 mL  1,000 mg Per Tube Q6H  . docusate sodium  200 mg Oral Daily  . enoxaparin (LOVENOX) injection  30 mg Subcutaneous Q24H  . insulin aspart  0-24 Units Subcutaneous Q4H  . levalbuterol  0.63 mg Nebulization TID  . mouth rinse  15 mL Mouth Rinse BID  . metoCLOPramide (REGLAN) injection  10 mg Intravenous Q6H    . metoprolol tartrate  12.5 mg Oral BID   Or  . metoprolol tartrate  12.5 mg Per Tube BID  . pantoprazole  40 mg Oral Daily  . potassium chloride  20 mEq Oral Q4H  . sertraline  25 mg Oral Daily  . sodium chloride flush  3 mL Intravenous Q12H   Continuous Infusions: . sodium chloride Stopped (05/18/18 1344)  . sodium chloride 250 mL (05/18/18 0506)  . sodium chloride Stopped (05/17/18 1913)  . dexmedetomidine (PRECEDEX) IV infusion Stopped (05/18/18 0458)  . lactated ringers    . lactated ringers    . lactated ringers Stopped (05/19/18 1043)   PRN Meds:.sodium chloride, lactated ringers, metoprolol tartrate, midazolam, morphine injection, ondansetron (ZOFRAN) IV, oxyCODONE, sodium chloride flush, traMADol  General appearance: alert, cooperative, appears stated age and no distress Neurologic: intact Heart: regular rate and rhythm, S1, S2 normal, no murmur, click, rub or gallop Lungs: diminished breath sounds bibasilar Abdomen: soft, non-tender; bowel sounds normal; no masses,  no organomegaly Extremities: extremities normal, atraumatic, no cyanosis or edema and Homans sign is negative, no sign of DVT Wound: Sternum is stable  Lab Results: CBC: Recent Labs    05/19/18 0506 05/20/18 0343  WBC 13.2* 9.6  HGB 8.8* 8.3*  HCT 28.4* 26.5*  PLT 133* 126*   BMET:  Recent Labs    05/19/18 0506  05/20/18 0343  NA 133* 139  K 4.0 3.7  CL 104 106  CO2 20* 26  GLUCOSE 116* 117*  BUN 19 15  CREATININE 1.20* 0.98  CALCIUM 7.6* 8.0*    CMET: Lab Results  Component Value Date   WBC 9.6 05/20/2018   HGB 8.3 (L) 05/20/2018   HCT 26.5 (L) 05/20/2018   PLT 126 (L) 05/20/2018   GLUCOSE 117 (H) 05/20/2018   CHOL 153 05/11/2017   TRIG 281 (H) 05/11/2017   HDL 29 (L) 05/11/2017   LDLCALC 68 05/11/2017   ALT 19 05/12/2018   AST 17 05/12/2018   NA 139 05/20/2018   K 3.7 05/20/2018   CL 106 05/20/2018   CREATININE 0.98 05/20/2018   BUN 15 05/20/2018   CO2 26 05/20/2018    INR 1.53 05/17/2018   HGBA1C 9.3 (H) 05/12/2018      PT/INR:  Recent Labs    05/17/18 1438  LABPROT 18.3*  INR 1.53   Radiology: No results found.   Assessment/Plan: S/P Procedure(s) (LRB): CORONARY ARTERY BYPASS GRAFTING (CABG) X 2 WITH ENDOSCOPIC HARVESTING OF RIGHT SAPHENOUS VEIN. LIMA TO LAD. SVG TO RCA (N/A) AORTIC VALVE REPLACEMENT (AVR) USING 21 MM MAGNA EASE PERICARDIAL BIOPROSTHESIS. MODEL # [id number]. SERIAL # [id number] (N/A) TRANSESOPHAGEAL ECHOCARDIOGRAM (TEE) (N/A) LEFT UPPER LOBE LUNG WEDGE RESECTION (Left) Mobilize Diuresis Plan for transfer to step-down: see transfer orders Resume effient since allergic to asa     Grace Isaac 05/20/2018 7:33 AM

## 2018-05-20 NOTE — Progress Notes (Signed)
CARDIAC REHAB PHASE I   PRE:  Rate/Rhythm: 97 SR    BP: sitting 153/74    SaO2: 96 2L  MODE:  Ambulation: 220 ft   POST:  Rate/Rhythm: 111 ST    BP: sitting 131/64     SaO2: 92 2L  Pt able to stand and walk with RW and 2L. Fairly steady, slow pace. Rest x3 for SOB/fatigue. SaO2 92 2L every spot check. Fatigues more than she thinks she should but is progressing. To bathroom then recliner. C/o nausea with activity.  Encouraged another walk this pm, IS, flutter, recliner. Will f/u am. 1310-1400  [place], ACSM 05/20/2018 1:58 PM

## 2018-05-21 ENCOUNTER — Inpatient Hospital Stay (HOSPITAL_COMMUNITY): Payer: 59

## 2018-05-21 LAB — CBC
HCT: 29.9 % — ABNORMAL LOW (ref 36.0–46.0)
Hemoglobin: 9.6 g/dL — ABNORMAL LOW (ref 12.0–15.0)
MCH: 30.6 pg (ref 26.0–34.0)
MCHC: 32.1 g/dL (ref 30.0–36.0)
MCV: 95.2 fL (ref 78.0–100.0)
Platelets: 194 K/uL (ref 150–400)
RBC: 3.14 MIL/uL — ABNORMAL LOW (ref 3.87–5.11)
RDW: 12.1 % (ref 11.5–15.5)
WBC: 9.9 K/uL (ref 4.0–10.5)

## 2018-05-21 LAB — BASIC METABOLIC PANEL
Anion gap: 10 (ref 5–15)
BUN: 11 mg/dL (ref 8–23)
CO2: 26 mmol/L (ref 22–32)
Calcium: 8.5 mg/dL — ABNORMAL LOW (ref 8.9–10.3)
Chloride: 100 mmol/L (ref 98–111)
Creatinine, Ser: 0.86 mg/dL (ref 0.44–1.00)
GFR calc Af Amer: >60 mL/min (ref >60)
GFR calc non Af Amer: >60 mL/min (ref >60)
Glucose, Bld: 164 mg/dL — ABNORMAL HIGH (ref 70–99)
Potassium: 3.5 mmol/L (ref 3.5–5.1)
Sodium: 136 mmol/L (ref 135–145)

## 2018-05-21 LAB — GLUCOSE, CAPILLARY
Glucose-Capillary: 166 mg/dL — ABNORMAL HIGH (ref 70–99)
Glucose-Capillary: 147 mg/dL — ABNORMAL HIGH (ref 70–99)
Glucose-Capillary: 120 mg/dL — ABNORMAL HIGH (ref 70–99)
Glucose-Capillary: 136 mg/dL — ABNORMAL HIGH (ref 70–99)

## 2018-05-21 MED ORDER — LOSARTAN POTASSIUM 25 MG PO TABS
25.0000 mg | ORAL_TABLET | Freq: Every day | ORAL | Status: DC
  Administered 2018-05-21 – 2018-05-24 (×4): 25 mg via ORAL
  Filled 2018-05-21 (×4): qty 1

## 2018-05-21 MED ORDER — POTASSIUM CHLORIDE CRYS ER 20 MEQ PO TBCR
20.0000 meq | EXTENDED_RELEASE_TABLET | Freq: Two times a day (BID) | ORAL | Status: DC
  Administered 2018-05-21 – 2018-05-23 (×6): 20 meq via ORAL
  Filled 2018-05-21 (×6): qty 1

## 2018-05-21 MED ORDER — CARVEDILOL 6.25 MG PO TABS
6.2500 mg | ORAL_TABLET | Freq: Two times a day (BID) | ORAL | Status: DC
  Administered 2018-05-21 – 2018-05-24 (×6): 6.25 mg via ORAL
  Filled 2018-05-21 (×6): qty 1

## 2018-05-21 NOTE — Progress Notes (Signed)
CARDIAC REHAB PHASE I   PRE:  Rate/Rhythm: 94 ST  BP:  Sitting: 121/81     SaO2: 94% 2L  MODE:  Ambulation: 450 ft   POST:  Rate/Rhythm: 104 ST  BP:  Sitting: 111/61     SaO2: 94% 2L Spot check during walk revealed 95% on 2L  1350-1430 Pt receiving breathing treatment upon arrival to room.  Pt assisted to bathroom to void.  Noted serosanguinous drainage from MSI on undergarments, gown, and gauze. No active drainage. RN notified.  Pt ambulated 450 ft with RW and 2L.  Pt stopped to rest three times. CO some SOB and feeling walk.  Pt to bed per pt request.  Encouraged patient to continue to use IS and flutter valve while awake and continued ambulation. Verbalized understanding.    Noel Christmas, RN 05/21/2018 2:26 PM

## 2018-05-21 NOTE — Progress Notes (Addendum)
[address]       [place] [address]             [phone]      4 Days Post-Op Procedure(s) (LRB): CORONARY ARTERY BYPASS GRAFTING (CABG) X 2 WITH ENDOSCOPIC HARVESTING OF RIGHT SAPHENOUS VEIN. LIMA TO LAD. SVG TO RCA (N/A) AORTIC VALVE REPLACEMENT (AVR) USING 21 MM MAGNA EASE PERICARDIAL BIOPROSTHESIS. MODEL # [id number]. SERIAL # [id number] (N/A) TRANSESOPHAGEAL ECHOCARDIOGRAM (TEE) (N/A) LEFT UPPER LOBE LUNG WEDGE RESECTION (Left) Subjective: C/o soreness, some productive cough  Objective: Vital signs in last 24 hours: Temp:  [98 F (36.7 C)-98.1 F (36.7 C)] 98.1 F (36.7 C) (09/14 0413) Pulse Rate:  [94-105] 101 (09/14 0413) Cardiac Rhythm: Normal sinus rhythm;Sinus tachycardia (09/13 2000) Resp:  [17-30] 24 (09/14 0413) BP: (128-154)/(65-93) 147/71 (09/14 0413) SpO2:  [94 %-97 %] 95 % (09/14 0715) FiO2 (%):  [28 %] 28 % (09/13 1437) Weight:  [95.3 kg] 95.3 kg (09/14 0403)  Hemodynamic parameters for last 24 hours:    Intake/Output from previous day: 09/13 0701 - 09/14 0700 In: 280 [P.O.:280] Out: 2150 [Urine:2150] Intake/Output this shift: No intake/output data recorded.  General appearance: alert, cooperative and no distress Heart: regular rate and rhythm Lungs: dim in bases Abdomen: benign Extremities: mild edema Wound: incis healing well  Lab Results: Recent Labs    05/20/18 0343 05/21/18 0418  WBC 9.6 9.9  HGB 8.3* 9.6*  HCT 26.5* 29.9*  PLT 126* 194   BMET:  Recent Labs    05/20/18 0343 05/21/18 0418  NA 139 136  K 3.7 3.5  CL 106 100  CO2 26 26  GLUCOSE 117* 164*  BUN 15 11  CREATININE 0.98 0.86  CALCIUM 8.0* 8.5*    PT/INR: No results for input(s): LABPROT, INR in the last 72 hours. ABG    Component Value Date/Time   PHART 7.317 (L) 05/17/2018 1833   HCO3 21.3 05/17/2018 1833   TCO2 20 (L) 05/18/2018 1934   ACIDBASEDEF 4.0 (H) 05/17/2018 1833   O2SAT 97.0 05/17/2018 1833   CBG (last 3)  Recent Labs   05/20/18 1748 05/20/18 2103 05/21/18 0626  GLUCAP 134* 132* 166*    Meds Scheduled Meds: . carvedilol  3.125 mg Oral BID WC  . docusate sodium  200 mg Oral Daily  . enoxaparin (LOVENOX) injection  40 mg Subcutaneous Q24H  . famotidine  20 mg Oral BID  . furosemide  40 mg Oral Daily  . glipiZIDE  10 mg Oral Q breakfast  . insulin aspart  0-24 Units Subcutaneous TID AC & HS  . levalbuterol  0.63 mg Nebulization TID  . loratadine  10 mg Oral Daily  . mouth rinse  15 mL Mouth Rinse BID  . metFORMIN  1,500 mg Oral Q supper  . potassium chloride  20 mEq Oral Daily  . prasugrel  10 mg Oral Daily  . rosuvastatin  20 mg Oral Once per day on Mon Wed Fri  . sertraline  25 mg Oral Daily  . sodium chloride flush  3 mL Intravenous Q12H   Continuous Infusions: . sodium chloride     PRN Meds:.sodium chloride, acetaminophen, bisacodyl **OR** bisacodyl, fluticasone, guaiFENesin, ondansetron **OR** ondansetron (ZOFRAN) IV, oxyCODONE, polyvinyl alcohol, sodium chloride flush, traMADol  Xrays Dg Chest Port 1 View  Result Date: 05/20/2018 CLINICAL DATA:  Chest pain, history of coronary bypass grafting EXAM: PORTABLE CHEST 1 VIEW COMPARISON:  05/19/2018 FINDINGS: Cardiac shadow remains enlarged. Postsurgical changes  are again seen. Right jugular sheath remains in place. Mediastinal drain is been removed. Mild right basilar atelectasis is noted new from the prior exam. Small left pleural effusion is seen. Some increased density associated with the recent surgery in the left upper lobe is noted stable from the prior study. IMPRESSION: Postoperative changes with increased density in the left upper lobe stable from the prior exam. Small left pleural effusion is again seen. Mild right basilar atelectasis is noted. Electronically Signed   By: Inez Catalina M.D.   On: 05/20/2018 08:11    Assessment/Plan: S/P Procedure(s) (LRB): CORONARY ARTERY BYPASS GRAFTING (CABG) X 2 WITH ENDOSCOPIC HARVESTING OF RIGHT  SAPHENOUS VEIN. LIMA TO LAD. SVG TO RCA (N/A) AORTIC VALVE REPLACEMENT (AVR) USING 21 MM MAGNA EASE PERICARDIAL BIOPROSTHESIS. MODEL # [id number]. SERIAL # [id number] (N/A) TRANSESOPHAGEAL ECHOCARDIOGRAM (TEE) (N/A) LEFT UPPER LOBE LUNG WEDGE RESECTION (Left)  1 doing well, hemodyn stable with some HTN, some sinus tachy- will increase coreg and add an ARB 2 replace K+, cont diuresis for volume overload 3 ABL anemia is improved with diuresis 4 BS control is fairly good, will need aggressive nutrition management/ lifestyle management. HgbA1C 9.3 4 push rehab, pulm toilet as able 6 CXR is pending  LOS: 4 days    Sarah Stevenson 05/21/2018 Maintaining sinus rhythm but progressing slowly Still has considerable postoperative pain and fatigue Surgical incisions clean and dry Postop care Dahlia Byes MD

## 2018-05-21 NOTE — Plan of Care (Signed)
  Problem: Education: Goal: Will demonstrate proper wound care and an understanding of methods to prevent future damage Outcome: Progressing Goal: Knowledge of disease or condition will improve Outcome: Progressing   Problem: Activity: Goal: Risk for activity intolerance will decrease Outcome: Progressing   Problem: Clinical Measurements: Goal: Postoperative complications will be avoided or minimized Outcome: Progressing   Problem: Skin Integrity: Goal: Wound healing without signs and symptoms of infection Outcome: Progressing Goal: Risk for impaired skin integrity will decrease Outcome: Progressing   Problem: Respiratory: Goal: Respiratory status will improve Outcome: Not Progressing

## 2018-05-22 LAB — BASIC METABOLIC PANEL
Anion gap: 11 (ref 5–15)
BUN: 12 mg/dL (ref 8–23)
CHLORIDE: 101 mmol/L (ref 98–111)
CO2: 28 mmol/L (ref 22–32)
CREATININE: 0.95 mg/dL (ref 0.44–1.00)
Calcium: 8.4 mg/dL — ABNORMAL LOW (ref 8.9–10.3)
GFR calc Af Amer: >60 mL/min (ref >60)
GFR calc non Af Amer: >60 mL/min (ref >60)
Glucose, Bld: 134 mg/dL — ABNORMAL HIGH (ref 70–99)
Potassium: 3.5 mmol/L (ref 3.5–5.1)
SODIUM: 140 mmol/L (ref 135–145)

## 2018-05-22 LAB — GLUCOSE, CAPILLARY
Glucose-Capillary: 125 mg/dL — ABNORMAL HIGH (ref 70–99)
Glucose-Capillary: 166 mg/dL — ABNORMAL HIGH (ref 70–99)
Glucose-Capillary: 71 mg/dL (ref 70–99)
Glucose-Capillary: 119 mg/dL — ABNORMAL HIGH (ref 70–99)

## 2018-05-22 MED ORDER — LEVALBUTEROL HCL 0.63 MG/3ML IN NEBU: 0.6300 mg | INHALATION_SOLUTION | Freq: Three times a day (TID) | RESPIRATORY_TRACT | Status: DC | PRN

## 2018-05-22 NOTE — Discharge Instructions (Signed)
Aortic Valve Replacement, Care After °Refer to this sheet in the next few weeks. These instructions provide you with information about caring for yourself after your procedure. Your health care provider may also give you more specific instructions. Your treatment has been planned according to current medical practices, but problems sometimes occur. Call your health care provider if you have any problems or questions after your procedure. °What can I expect after the procedure? °After the procedure, it is common to have: °· Pain around your incision area. °· A small amount of blood or clear fluid coming from your incision. ° °Follow these instructions at home: °Eating and drinking ° °· Follow instructions from your health care provider about eating or drinking restrictions. °? Limit alcohol intake to no more than 1 drink per day for nonpregnant women and 2 drinks per day for men. One drink equals 12 oz of beer, 5 oz of wine, or 1½ oz of hard liquor. °? Limit how much caffeine you drink. Caffeine can affect your heart's rate and rhythm. °· Drink enough fluid to keep your urine clear or pale yellow. °· Eat a heart-healthy diet. This should include plenty of fresh fruits and vegetables. If you eat meat, it should be lean cuts. Avoid foods that are: °? High in salt, saturated fat, or sugar. °? Canned or highly processed. °? Fried. °Activity °· Return to your normal activities as told by your health care provider. Ask your health care provider what activities are safe for you. °· Exercise regularly once you have recovered, as told by your health care provider. °· Avoid sitting for more than 2 hours at a time without moving. Get up and move around at least once every 1-2 hours. This helps to prevent blood clots in the legs. °· Do not lift anything that is heavier than 10 lb (4.5 kg) until your health care provider approves. °· Avoid pushing or pulling things with your arms until your health care provider approves. This  includes pulling on handrails to help you climb stairs. °Incision care ° °· Follow instructions from your health care provider about how to take care of your incision. Make sure you: °? Wash your hands with soap and water before you change your bandage (dressing). If soap and water are not available, use hand sanitizer. °? Change your dressing as told by your health care provider. °? Leave stitches (sutures), skin glue, or adhesive strips in place. These skin closures may need to stay in place for 2 weeks or longer. If adhesive strip edges start to loosen and curl up, you may trim the loose edges. Do not remove adhesive strips completely unless your health care provider tells you to do that. °· Check your incision area every day for signs of infection. Check for: °? More redness, swelling, or pain. °? More fluid or blood. °? Warmth. °? Pus or a bad smell. °Medicines °· Take over-the-counter and prescription medicines only as told by your health care provider. °· If you were prescribed an antibiotic medicine, take it as told by your health care provider. Do not stop taking the antibiotic even if you start to feel better. °Travel °· Avoid airplane travel for as long as told by your health care provider. °· When you travel, bring a list of your medicines and a record of your medical history with you. Carry your medicines with you. °Driving °· Ask your health care provider when it is safe for you to drive. Do not drive until your health   care provider approves.  Do not drive or operate heavy machinery while taking prescription pain medicine. Lifestyle   Do not use any tobacco products, such as cigarettes, chewing tobacco, or e-cigarettes. If you need help quitting, ask your health care provider.  Resume sexual activity as told by your health care provider. Do not use medicines for erectile dysfunction unless your health care provider approves, if this applies.  Work with your health care provider to keep your  blood pressure and cholesterol under control, and to manage any other heart conditions that you have.  Maintain a healthy weight. General instructions  Do not take baths, swim, or use a hot tub until your health care provider approves.  Do not strain to have a bowel movement.  Avoid crossing your legs while sitting down.  Check your temperature every day for a fever. A fever may be a sign of infection.  If you are a woman and you plan to become pregnant, talk with your health care provider before you become pregnant.  Wear compression stockings if your health care provider instructs you to do this. These stockings help to prevent blood clots and reduce swelling in your legs.  Tell all health care providers who care for you that you have an artificial (prosthetic) aortic valve. If you have or have had heart disease or endocarditis, tell all health care providers about these conditions as well.  Keep all follow-up visits as told by your health care provider. This is important. Contact a health care provider if:  You develop a skin rash.  You experience sudden, unexplained changes in your weight.  You have more redness, swelling, or pain around your incision.  You have more fluid or blood coming from your incision.  Your incision feels warm to the touch.  You have pus or a bad smell coming from your incision.  You have a fever. Get help right away if:  You develop chest pain that is different from the pain coming from your incision. You develop shortness of brea Endoscopic Saphenous Vein Harvesting, Care After Refer to this sheet in the next few weeks. These instructions provide you with information about caring for yourself after your procedure. Your health care provider may also give you more specific instructions. Your treatment has been planned according to current medical practices, but problems sometimes occur. Call your health care provider if you have any problems or  questions after your procedure. What can I expect after the procedure? After the procedure, it is common to have:  Pain.  Bruising.  Swelling.  Numbness.  Follow these instructions at home: Medicine  Take over-the-counter and prescription medicines only as told by your health care provider.  Do not drive or operate heavy machinery while taking prescription pain medicine. Incision care   Follow instructions from your health care provider about how to take care of the cut made during surgery (incision). Make sure you: ? Wash your hands with soap and water before you change your bandage (dressing). If soap and water are not available, use hand sanitizer. ? Change your dressing as told by your health care provider. ? Leave stitches (sutures), skin glue, or adhesive strips in place. These skin closures may need to be in place for 2 weeks or longer. If adhesive strip edges start to loosen and curl up, you may trim the loose edges. Do not remove adhesive strips completely unless your health care provider tells you to do that.  Check your incision area every day  for signs of infection. Check for: ? More redness, swelling, or pain. ? More fluid or blood. ? Warmth. ? Pus or a bad smell. General instructions  Raise (elevate) your legs above the level of your heart while you are sitting or lying down.  Do any exercises your health care providers have given you. These may include deep breathing, coughing, and walking exercises.  Do not shower, take baths, swim, or use a hot tub unless told by your health care provider.  Wear your elastic stocking if told by your health care provider.  Keep all follow-up visits as told by your health care provider. This is important. Contact a health care provider if:  Medicine does not help your pain.  Your pain gets worse.  You have new leg bruises or your leg bruises get bigger.  You have a fever.  Your leg feels numb.  You have more  redness, swelling, or pain around your incision.  You have more fluid or blood coming from your incision.  Your incision feels warm to the touch.  You have pus or a bad smell coming from your incision. Get help right away if:  Your pain is severe.  You develop pain, tenderness, warmth, redness, or swelling in any part of your leg.  You have chest pain.  You have trouble breathing. This information is not intended to replace advice given to you by your health care provider. Make sure you discuss any questions you have with your health care provider. Document Released: 05/06/2011 Document Revised: 01/30/2016 Document Reviewed: 07/08/2015 Elsevier Interactive Patient Education  2018 [place].  Coronary Artery Bypass Grafting, Care After These instructions give you information on caring for yourself after your procedure. Your doctor may also give you more specific instructions. Call your doctor if you have any problems or questions after your procedure. Follow these instructions at home:  Only take medicine as told by your doctor. Take medicines exactly as told. Do not stop taking medicines or start any new medicines without talking to your doctor first.  Take your pulse as told by your doctor.  Do deep breathing as told by your doctor. Use your breathing device (incentive spirometer), if given, to practice deep breathing several times a day. Support your chest with a pillow or your arms when you take deep breaths or cough.  Keep the area clean, dry, and protected where the surgery cuts (incisions) were made. Remove bandages (dressings) only as told by your doctor. If strips were applied to surgical area, do not take them off. They fall off on their own.  Check the surgery area daily for puffiness (swelling), redness, or leaking fluid.  If surgery cuts were made in your legs: ? Avoid crossing your legs. ? Avoid sitting for long periods of time. Change positions every 30  minutes. ? Raise your legs when you are sitting. Place them on pillows.  Wear stockings that help keep blood clots from forming in your legs (compression stockings).  Only take sponge baths until your doctor says it is okay to take showers. Pat the surgery area dry. Do not rub the surgery area with a washcloth or towel. Do not bathe, swim, or use a hot tub until your doctor says it is okay.  Eat foods that are high in fiber. These include raw fruits and vegetables, whole grains, beans, and nuts. Choose lean meats. Avoid canned, processed, and fried foods.  Drink enough fluids to keep your pee (urine) clear or pale yellow.  Weigh yourself every day.  Rest and limit activity as told by your doctor. You may be told to: ? Stop any activity if you have chest pain, shortness of breath, changes in heartbeat, or dizziness. Get help right away if this happens. ? Move around often for short amounts of time or take short walks as told by your doctor. Gradually become more active. You may need help to strengthen your muscles and build endurance. ? Avoid lifting, pushing, or pulling anything heavier than 10 pounds (4.5 kg) for at least 6 weeks after surgery.  Do not drive until your doctor says it is okay.  Ask your doctor when you can go back to work.  Ask your doctor when you can begin sexual activity again.  Follow up with your doctor as told. Contact a doctor if:  You have puffiness, redness, more pain, or fluid draining from the incision site.  You have a fever.  You have puffiness in your ankles or legs.  You have pain in your legs.  You gain 2 or more pounds (0.9 kg) a day.  You feel sick to your stomach (nauseous) or throw up (vomit).  You have watery poop (diarrhea). Get help right away if:  You have chest pain that goes to your jaw or arms.  You have shortness of breath.  You have a fast or irregular heartbeat.  You notice a "clicking" in your breastbone when you  move.  You have numbness or weakness in your arms or legs.  You feel dizzy or light-headed. This information is not intended to replace advice given to you by your health care provider. Make sure you discuss any questions you have with your health care provider. Document Released: 08/29/2013 Document Revised: 01/30/2016 Document Reviewed: 01/31/2013 Elsevier Interactive Patient Education  2017 [employer].  th or difficulty breathing.  You start to feel light-headed. These symptoms may represent a serious problem that is an emergency. Do not wait to see if the symptoms will go away. Get medical help right away. Call your local emergency services (911 in the [place] Do not drive yourself to the hospital. This information is not intended to replace advice given to you by your health care provider. Make sure you discuss any questions you have with your health care provider. Document Released: 03/12/2005 Document Revised: 01/30/2016 Document Reviewed: 07/28/2015 Elsevier Interactive Patient Education  2017 [employer].

## 2018-05-22 NOTE — Progress Notes (Signed)
Pacing wires removed, per order. Wire ends intact. Scant serosanguinous drainage noted to left pacing wire site. Gauze dressing applied. Vitals stable and being monitored per protocol. Bedrest x1 hour. Pt in bed with call light within reach. Will continue to monitor.   Ara Kussmaul BSN, RN

## 2018-05-22 NOTE — Progress Notes (Signed)
Pt ambulated 431ft in hallway on room air. O2 sats remained 94-96% on room air. Pt tolerated well. Returned to chair.    Ara Kussmaul BSN, RN

## 2018-05-22 NOTE — Progress Notes (Addendum)
[address]       [place] [address]             [phone]      5 Days Post-Op Procedure(s) (LRB): CORONARY ARTERY BYPASS GRAFTING (CABG) X 2 WITH ENDOSCOPIC HARVESTING OF RIGHT SAPHENOUS VEIN. LIMA TO LAD. SVG TO RCA (N/A) AORTIC VALVE REPLACEMENT (AVR) USING 21 MM MAGNA EASE PERICARDIAL BIOPROSTHESIS. MODEL # [id number]. SERIAL # [id number] (N/A) TRANSESOPHAGEAL ECHOCARDIOGRAM (TEE) (N/A) LEFT UPPER LOBE LUNG WEDGE RESECTION (Left) Subjective: Less pain, ambulation slowly improving  Objective: Vital signs in last 24 hours: Temp:  [98 F (36.7 C)-98.4 F (36.9 C)] 98.3 F (36.8 C) (09/15 0433) Pulse Rate:  [87-99] 87 (09/15 0433) Cardiac Rhythm: Normal sinus rhythm;Bundle branch block (09/14 1900) Resp:  [17-22] 22 (09/15 0433) BP: (102-155)/(55-78) 117/71 (09/15 0433) SpO2:  [93 %-96 %] 96 % (09/15 0433) Weight:  [94 kg] 94 kg (09/15 0320)  Hemodynamic parameters for last 24 hours:    Intake/Output from previous day: 09/14 0701 - 09/15 0700 In: 720 [P.O.:720] Out: 2625 [Urine:2625] Intake/Output this shift: No intake/output data recorded.  General appearance: alert, cooperative and no distress Heart: regular rate and rhythm Lungs: clear to auscultation bilaterally Abdomen: benign Extremities: + minor LE edema Wound: incis healing well  Lab Results: Recent Labs    05/20/18 0343 05/21/18 0418  WBC 9.6 9.9  HGB 8.3* 9.6*  HCT 26.5* 29.9*  PLT 126* 194   BMET:  Recent Labs    05/21/18 0418 05/22/18 0428  NA 136 140  K 3.5 3.5  CL 100 101  CO2 26 28  GLUCOSE 164* 134*  BUN 11 12  CREATININE 0.86 0.95  CALCIUM 8.5* 8.4*    PT/INR: No results for input(s): LABPROT, INR in the last 72 hours. ABG    Component Value Date/Time   PHART 7.317 (L) 05/17/2018 1833   HCO3 21.3 05/17/2018 1833   TCO2 20 (L) 05/18/2018 1934   ACIDBASEDEF 4.0 (H) 05/17/2018 1833   O2SAT 97.0 05/17/2018 1833   CBG (last 3)  Recent Labs     05/21/18 1651 05/21/18 2135 05/22/18 0612  GLUCAP 120* 136* 125*    Meds Scheduled Meds: . carvedilol  6.25 mg Oral BID WC  . docusate sodium  200 mg Oral Daily  . enoxaparin (LOVENOX) injection  40 mg Subcutaneous Q24H  . famotidine  20 mg Oral BID  . furosemide  40 mg Oral Daily  . glipiZIDE  10 mg Oral Q breakfast  . insulin aspart  0-24 Units Subcutaneous TID AC & HS  . levalbuterol  0.63 mg Nebulization TID  . loratadine  10 mg Oral Daily  . losartan  25 mg Oral Daily  . mouth rinse  15 mL Mouth Rinse BID  . metFORMIN  1,500 mg Oral Q supper  . potassium chloride  20 mEq Oral BID  . prasugrel  10 mg Oral Daily  . rosuvastatin  20 mg Oral Once per day on Mon Wed Fri  . sertraline  25 mg Oral Daily  . sodium chloride flush  3 mL Intravenous Q12H   Continuous Infusions: . sodium chloride     PRN Meds:.sodium chloride, acetaminophen, bisacodyl **OR** bisacodyl, fluticasone, guaiFENesin, ondansetron **OR** ondansetron (ZOFRAN) IV, oxyCODONE, polyvinyl alcohol, sodium chloride flush, traMADol  Xrays Dg Chest 2 View  Result Date: 05/21/2018 CLINICAL DATA:  S/p CABG/AVR on 9/10; pt reports increased SOB since surgery; former smoker; hx of DM EXAM: CHEST -  2 VIEW COMPARISON:  the previous day's study FINDINGS: Interval removal of right IJ venous sheath. Improving left upper lobe aeration with persistent suprahilar consolidation/atelectasis. Small pleural effusions, left greater than right. Central pulmonary vascular congestion with improvement in interstitial edema. Stable cardiomegaly.  Changes of AVR and CABG.  No pneumothorax. Previous median sternotomy. Anterior vertebral endplate spurring at multiple levels in the mid and lower thoracic spine. IMPRESSION: 1. Improving interstitial edema with central pulmonary vascular congestion and stable cardiomegaly. 2. Improving left suprahilar airspace disease. 3. Persistent small effusions. Electronically Signed   By: Lucrezia Europe M.D.   On:  05/21/2018 09:25    Assessment/Plan: S/P Procedure(s) (LRB): CORONARY ARTERY BYPASS GRAFTING (CABG) X 2 WITH ENDOSCOPIC HARVESTING OF RIGHT SAPHENOUS VEIN. LIMA TO LAD. SVG TO RCA (N/A) AORTIC VALVE REPLACEMENT (AVR) USING 21 MM MAGNA EASE PERICARDIAL BIOPROSTHESIS. MODEL # [id number]. SERIAL # [id number] (N/A) TRANSESOPHAGEAL ECHOCARDIOGRAM (TEE) (N/A) LEFT UPPER LOBE LUNG WEDGE RESECTION (Left)  1 hemodyn stable, BP improved with increase in coreg and starting ARB. 2 Creat slightly higher but  In normal range, follow 3 sinus rhythm- d/c epw's today 4 replace K+ 5 cont diuresis for volume overload 6 wean O2 7 Sugars adeq control, needs cont nutrition and lifestyle management for metabolic syndrome , current meds 8 poss home 1-3 days dep on progress   LOS: 5 days    John Giovanni 05/22/2018 Pager [phone]  Patient examined and laboratory data reviewed Would expect patient to be ready for discharge probably Tuesday a.m. still complains of fatigue. Needs more diuresis prior to discharge. Slight serous drainage of fat necrosis from lower sternal incision. Dahlia Byes MD

## 2018-05-22 NOTE — Discharge Summary (Addendum)
Physician Discharge Summary  Patient ID: LOA IDLER MRN: [id number] DOB/AGE: 62/02/1956 62 y.o.  Admit date: 05/17/2018 Discharge date: 05/24/2018  Admission Diagnoses: Severe coronary artery disease and aortic stenosis.  Discharge Diagnoses:  Active Problems:   CAD (coronary artery disease)   Adenocarcinoma of lung, left [facility])  Patient Active Problem List   Diagnosis Date Noted  . Adenocarcinoma of lung, left ([place]) 05/20/2018  . CAD (coronary artery disease) 05/17/2018  . DOE (dyspnea on exertion)   . Trochanteric bursitis, right hip 12/01/2017  . Unilateral primary osteoarthritis, right hip 12/01/2017  . Status post total replacement of left hip 04/23/2017  . Pain in left hip 03/31/2017  . Unilateral primary osteoarthritis, left hip 03/31/2017  . Sciatica, left side 03/31/2017  . Severe aortic stenosis   . Positive cardiac stress test   . Trochanteric bursitis of left hip 08/14/2015  . Arthritis of left hip 08/14/2015  . Excessive daytime sleepiness 03/03/2015  . Snoring 12/20/2014  . CAD (coronary artery disease), native coronary artery 12/19/2014  . Type 2 diabetes mellitus with complication ([place])   . Hyperlipidemia   . Murmur, cardiac    History of present illness:  Patient is a 62 year old female referred from cardiology to Dr. Servando Snare for evaluation of severe coronary artery disease and aortic valve stenosis for consideration of surgery.  Patient has increasing episodes of shortness of breath with exertion.  Symptoms have been progressive over the past 6 months.  Patient also has chest discomfort.  Recent cardiac catheterization and echocardiogram confirmed these findings.  The valve was bicuspid with severe aortic stenosis.  The peak gradient was 76 mmHg with a mean gradient of 41 mmHg.  The estimated valve area was 0.84.  The patient does have a history of CAD with an RCA DES in March 2017.  She has multiple cardiac risk factors including diabetes mellitus and  hyperlipidemia.  Dr. Servando Snare evaluate the patient and his studies and agreed recommendations to proceed with surgical revascularization and aortic valve replacement.   Discharged Condition: good  Hospital Course: Patient was admitted electively and on 05/17/2018 taken to the operating room where she underwent the below described procedure.  She tolerated it well and was taken to the surgical intensive care unit in stable condition.  Postoperative hospital course:  The patient is overall progressed well.  He was extubated using standard protocols.  She did initially require some Neo-Synephrine for support but this was able to be weaned without significant difficulty.  All routine lines, monitors and drainage devices were discontinued in a standard fashion.  She did have a postoperative volume overload which is responding to diuretics.  Been somewhat slow in her physical recovery but is tolerating gradually increasing activities using cardiac rehab modalities.  She has maintained sinus rhythm with some sinus tachycardia.  Beta-blocker has been adjusted.  She is also had some hypertension and has been started on an ARB.  She has been stable in regard to her diabetes with transition from [place] to oral meds.  Her blood sugars have been under adequate control but she will need aggressive lifestyle and nutrition management as an outpatient.  Incisions are healing well without evidence of infection.  Surgeon has been weaned and maintains good saturations on room air.  Renal function has remained within normal limits.  She does have an expected acute blood loss anemia and values are stable.  Most recent hemoglobin and hematocrit are 9.6/29.9 respectively.  At the time of discharge the patient is felt  to be quite stable.  Right  Consults: cardiology  Significant Diagnostic Studies: Routine postoperative serial chest x-rays and labs.  Treatments: surgery:               05/17/2018  2:44 PM  PATIENT:   Sarah Stevenson  62 y.o. 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 LIMA TO LAD. SVG TO RCA (N/A) AORTIC VALVE REPLACEMENT (AVR) USING 21 MM MAGNA EASE PERICARDIAL BIOPROSTHESIS. MODEL # 3300TFX. SERIAL # [id number] (N/A) TRANSESOPHAGEAL ECHOCARDIOGRAM (TEE) (N/A) LEFT UPPER LOBE LUNG WEDGE RESECTION (Left)  SURGEON:  Surgeon(s) and Role:    * Grace Isaac, MD - Primary  PHYSICIAN ASSISTANT:  Nicholes Rough, PA-C  Discharge Exam: Blood pressure 108/75, pulse 91, temperature 98.1 F (36.7 C), temperature source Oral, resp. rate 20, height 5' 2.5" (1.588 m), weight 92.2 kg, SpO2 94 %.  General appearance: alert, cooperative and no distress Heart: regular rate and rhythm, S1, S2 normal, no murmur, click, rub or gallop Lungs: clear to auscultation bilaterally Abdomen: soft, non-tender; bowel sounds normal; no masses,  no organomegaly Extremities: extremities normal, atraumatic, no cyanosis or edema Wound: clean and dry   Disposition: Discharge disposition: 01-Home or Self Care       Discharge Instructions    Amb Referral to Cardiac Rehabilitation   Complete by:  As directed    Diagnosis:   CABG Valve Replacement     Valve:  Aortic   CABG X ___:  2     Allergies as of 05/24/2018      Reactions   Brilinta [ticagrelor] Shortness Of Breath   Lisinopril Cough   Asa [aspirin] Hives, Rash   Blue Dyes (parenteral) Hives   Corn-containing Products Hives   Hetastarch Hives, Rash   Nsaids Hives, Rash   Sugar-protein-starch Hives   Tea Hives   Isosulfan Blue Rash   Other Itching, Rash   CHG soap      Medication List    STOP taking these medications   celecoxib 200 MG capsule Commonly known as:  CELEBREX    ciprofloxacin 500 MG tablet Commonly known as:  CIPRO   isosorbide mononitrate 30 MG 24 hr tablet Commonly known as:  IMDUR   nitroGLYCERIN 0.4 MG SL tablet Commonly known as:  NITROSTAT   TRULICITY 8.11 XB/2.6OM Sopn Generic drug:  Dulaglutide   Turmeric Curcumin Caps     TAKE these medications   acetaminophen 500 MG tablet Commonly known as:  TYLENOL Take 500-1,000 mg by mouth 2 (two) times daily as needed for moderate pain.   B-12 PO Place 1 Dose under the tongue daily.   carvedilol 6.25 MG tablet Commonly known as:  COREG Take 1 tablet (6.25 mg total) by mouth 2 (two) times daily with a meal. What changed:    medication strength  how much to take  how to take this  when to take this  additional instructions   cetirizine 10 MG tablet Commonly known as:  ZYRTEC Take 10 mg by mouth daily.   CLEAR EYES NATURAL TEARS OP Place 1 drop into both eyes daily as needed (dry eyes).   fluticasone 50 MCG/ACT nasal spray Commonly known as:  FLONASE Place 1 spray into both nostrils daily as needed for allergies or rhinitis.  furosemide 20 MG tablet Commonly known as:  LASIX Take 1 tablet (20 mg total) by mouth daily.   glipiZIDE 10 MG 24 hr tablet Commonly known as:  GLUCOTROL XL Take 10 mg by mouth daily with breakfast.   losartan 25 MG tablet Commonly known as:  COZAAR Take 1 tablet (25 mg total) by mouth daily. What changed:    medication strength  how much to take   metFORMIN 750 MG 24 hr tablet Commonly known as:  GLUCOPHAGE-XR Take 1,500 mg by mouth every evening.   MUSCLE RUB EX Apply 1 application topically as needed (for pain).   oxyCODONE 5 MG immediate release tablet Commonly known as:  Oxy IR/ROXICODONE Take 1 tablet (5 mg total) by mouth every 6 (six) hours as needed for severe pain.   potassium chloride 10 MEQ tablet Commonly known as:  K-DUR,KLOR-CON Take 1 tablet (10 mEq total) by mouth daily.   prasugrel 10 MG Tabs  tablet Commonly known as:  EFFIENT Take 1 tablet (10 mg total) by mouth daily.   rosuvastatin 40 MG tablet Commonly known as:  CRESTOR Take 20 mg by mouth 3 (three) times a week.   sertraline 50 MG tablet Commonly known as:  ZOLOFT Take 25 mg by mouth daily.   ZANTAC 150 MG tablet Generic drug:  ranitidine Take 150 mg by mouth at bedtime.      Follow-up Information    Grace Isaac, MD Follow up.   Specialty:  Cardiothoracic Surgery Why:  Your routine follow-up appointment is on 06/20/2018 at 1:30pm.   Please obtain a chest x-ray 1/2-hour prior to this appointment at [facility] imaging which is located in the same office complex. Contact information: [address] [place] [address] [phone]        Burtis Junes, NP Follow up.   Specialties:  Nurse Practitioner, Interventional Cardiology, Cardiology, Radiology Why:  The cardiology office will contact you with a 2-week follow-up appointment with cardiology. Contact information: [place]. [address] [place] [address] [phone]        Christain Sacramento, MD. Call in 1 day(s).   Specialty:  Family Medicine Contact information: [address] [place] [address] [phone]        nuring appointment Follow up.   Why:  Your suture removal appointment is on Tuesday 05/31/2018 at 10:15am.  Contact information: Dr. Everrett Coombe office         The patient has been discharged on:   1.Beta Blocker:  Yes [  y ]                              No   [   ]                              If No, reason:  2.Ace Inhibitor/ARB: Yes Blue.Reese   ]                                     No  [    ]                                     If No, reason:  3.Statin:  Yes [  y ]                  No  [   ]                  If No, reason:  4.Ecasa:  Yes  [   ]                  No   [n   ]                  If No, reason: Allergy Signed: Elgie Collard 05/24/2018, 12:39 PM

## 2018-05-23 LAB — GLUCOSE, CAPILLARY
Glucose-Capillary: 131 mg/dL — ABNORMAL HIGH (ref 70–99)
Glucose-Capillary: 137 mg/dL — ABNORMAL HIGH (ref 70–99)
Glucose-Capillary: 136 mg/dL — ABNORMAL HIGH (ref 70–99)
Glucose-Capillary: 92 mg/dL (ref 70–99)

## 2018-05-23 LAB — BASIC METABOLIC PANEL
Anion gap: 12 (ref 5–15)
BUN: 12 mg/dL (ref 8–23)
CALCIUM: 8.7 mg/dL — AB (ref 8.9–10.3)
CO2: 27 mmol/L (ref 22–32)
CREATININE: 0.94 mg/dL (ref 0.44–1.00)
Chloride: 99 mmol/L (ref 98–111)
GFR calc non Af Amer: >60 mL/min (ref >60)
Glucose, Bld: 133 mg/dL — ABNORMAL HIGH (ref 70–99)
Potassium: 3.9 mmol/L (ref 3.5–5.1)
SODIUM: 138 mmol/L (ref 135–145)

## 2018-05-23 MED ORDER — MORPHINE SULFATE (PF) 2 MG/ML IV SOLN
INTRAVENOUS | Status: AC
  Administered 2018-05-23: 2 mg
  Filled 2018-05-23: qty 1

## 2018-05-23 MED ORDER — MORPHINE SULFATE (PF) 2 MG/ML IV SOLN: 2.0000 mg | Freq: Once | INTRAVENOUS | Status: DC

## 2018-05-23 MED ORDER — ACETAMINOPHEN 325 MG PO TABS
650.0000 mg | ORAL_TABLET | ORAL | Status: DC | PRN
  Administered 2018-05-23 – 2018-05-24 (×3): 650 mg via ORAL
  Filled 2018-05-23 (×4): qty 2

## 2018-05-23 MED ORDER — GLUCERNA SHAKE PO LIQD
237.0000 mL | Freq: Three times a day (TID) | ORAL | Status: DC
  Administered 2018-05-24: 237 mL via ORAL

## 2018-05-23 NOTE — Progress Notes (Addendum)
      [address]       [place] [address]             [phone]       Paged this afternoon regarding chest and left shoulder blade pain radiating down the patient's left arm.She also shares she has a headache which is all on the left side behind her eye and chest pain also on the left. She had just gone to the bathroom and did not strain. She insists that she is obeying her sternal precautions. Her chest pain subsided with 2mg  IV morphine and her nausea is improving after zofran. She lays on her left shoulder at night which is a potential cause for her pain. When she moves the shoulder the pain in her arm gets better. An EKG was obtained and it was stable. HR and BP have been stable. Continue current medical treatment. I ordered a k-pad for her back and Glucerna since she has not been eating much at all. I was unable to assess her drainage from the lower portion of her incision since the bandage was just changed. Will order her Zyrtec since she is having some sinus pressure and congestion.    I think it is reasonable to keep her until tomorrow so we can get her pain and nausea under control. I would like her have more oral intake before discharge. I will discuss with Dr. Servando Snare.    Nicholes Rough, PA-C Discussed patient and keeping today , poss d/c home tomorrow . I have seen and examined Sarah Stevenson and agree with the above assessment  and plan.  Grace Isaac MD Beeper [phone] Office [phone] 05/23/2018 3:45 PM

## 2018-05-23 NOTE — Progress Notes (Addendum)
      [address]       [place] [address]             [phone]      6 Days Post-Op Procedure(s) (LRB): CORONARY ARTERY BYPASS GRAFTING (CABG) X 2 WITH ENDOSCOPIC HARVESTING OF RIGHT SAPHENOUS VEIN. LIMA TO LAD. SVG TO RCA (N/A) AORTIC VALVE REPLACEMENT (AVR) USING 21 MM MAGNA EASE PERICARDIAL BIOPROSTHESIS. MODEL # [id number]. SERIAL # [id number] (N/A) TRANSESOPHAGEAL ECHOCARDIOGRAM (TEE) (N/A) LEFT UPPER LOBE LUNG WEDGE RESECTION (Left) Subjective: No issue overnight. She is having trouble sleeping in the hospital beds  Objective: Vital signs in last 24 hours: Temp:  [97.7 F (36.5 C)-98.8 F (37.1 C)] 98.1 F (36.7 C) (09/16 0442) Pulse Rate:  [84-92] 88 (09/16 0500) Cardiac Rhythm: Normal sinus rhythm (09/16 0442) Resp:  [18-21] 21 (09/16 0500) BP: (94-149)/(60-84) 120/60 (09/16 0442) SpO2:  [91 %-98 %] 91 % (09/16 0500) Weight:  [92.5 kg] 92.5 kg (09/16 0300)     Intake/Output from previous day: 09/15 0701 - 09/16 0700 In: 600 [P.O.:600] Out: 2700 [Urine:2700] Intake/Output this shift: No intake/output data recorded.  General appearance: alert, cooperative and no distress Heart: regular rate and rhythm, S1, S2 normal, no murmur, click, rub or gallop Lungs: clear to auscultation bilaterally Abdomen: soft, non-tender; bowel sounds normal; no masses,  no organomegaly Extremities: extremities normal, atraumatic, no cyanosis or edema Wound: clean and dry. Lower portion dressed with a fresh dressing  Lab Results: Recent Labs    05/21/18 0418  WBC 9.9  HGB 9.6*  HCT 29.9*  PLT 194   BMET:  Recent Labs    05/22/18 0428 05/23/18 0501  NA 140 138  K 3.5 3.9  CL 101 99  CO2 28 27  GLUCOSE 134* 133*  BUN 12 12  CREATININE 0.95 0.94  CALCIUM 8.4* 8.7*    PT/INR: No results for input(s): LABPROT, INR in the last 72 hours. ABG    Component Value Date/Time   PHART 7.317 (L) 05/17/2018 1833   HCO3 21.3 05/17/2018 1833   TCO2 20 (L)  05/18/2018 1934   ACIDBASEDEF 4.0 (H) 05/17/2018 1833   O2SAT 97.0 05/17/2018 1833   CBG (last 3)  Recent Labs    05/22/18 1644 05/22/18 2107 05/23/18 0621  GLUCAP 71 119* 131*    Assessment/Plan: S/P Procedure(s) (LRB): CORONARY ARTERY BYPASS GRAFTING (CABG) X 2 WITH ENDOSCOPIC HARVESTING OF RIGHT SAPHENOUS VEIN. LIMA TO LAD. SVG TO RCA (N/A) AORTIC VALVE REPLACEMENT (AVR) USING 21 MM MAGNA EASE PERICARDIAL BIOPROSTHESIS. MODEL # [id number]. SERIAL # [id number] (N/A) TRANSESOPHAGEAL ECHOCARDIOGRAM (TEE) (N/A) LEFT UPPER LOBE LUNG WEDGE RESECTION (Left)  1. NSR in the 90s. BP well controlled. Tolerating Coreg and Cozaar. Continue Crestor and Effient. Wires are out. 2. Tolerating room air with good oxygen saturation 3. Continue diuresis for fluid overload. Weight continues to trend down. She is down to her baseline.  4. H and H has been stable, acute blood loss anemia.  5. Thrombocytopenia-has improved. 194k. 6. Blood glucose level well controlled on current regimen.   Plan: Monitor lower sternum drainage today and possibly home later today vs. Tomorrow. Continue walking in the halls. Review sternal precautions.     LOS: 6 days    Sarah Stevenson 05/23/2018  I have seen and examined Sarah Stevenson and agree with the above assessment  and plan.  Grace Isaac MD Beeper [phone] Office [phone] 05/23/2018 3:44 PM

## 2018-05-23 NOTE — Progress Notes (Signed)
CARDIAC REHAB PHASE I   PRE:  Rate/Rhythm: 98 SR    BP: sitting 125/81    SaO2: 94 RA  MODE:  Ambulation: 470 ft   POST:  Rate/Rhythm: 114 ST    BP: sitting 124/61     SaO2: 95 RA  Moving better with RW in hall, rest x2 for SOB. Pt struggles to keep sternal precautions. She is using her arms for chair and bed mobility often. Discussed this. She is c/o back pain from bed, has to change position often. Ed completed. Will refer to [place], ACSM 05/23/2018 9:30 AM

## 2018-05-24 LAB — GLUCOSE, CAPILLARY
Glucose-Capillary: 130 mg/dL — ABNORMAL HIGH (ref 70–99)
Glucose-Capillary: 154 mg/dL — ABNORMAL HIGH (ref 70–99)

## 2018-05-24 MED ORDER — OXYCODONE HCL 5 MG PO TABS: 5.0000 mg | ORAL_TABLET | Freq: Four times a day (QID) | ORAL | 0 refills | Status: DC | PRN

## 2018-05-24 MED ORDER — POTASSIUM CHLORIDE CRYS ER 10 MEQ PO TBCR: 10.0000 meq | EXTENDED_RELEASE_TABLET | Freq: Every day | ORAL | 1 refills | Status: DC

## 2018-05-24 MED ORDER — FUROSEMIDE 20 MG PO TABS: 20.0000 mg | ORAL_TABLET | Freq: Every day | ORAL | 1 refills | Status: DC

## 2018-05-24 MED ORDER — LOSARTAN POTASSIUM 25 MG PO TABS: 25.0000 mg | ORAL_TABLET | Freq: Every day | ORAL | 1 refills | Status: DC

## 2018-05-24 MED ORDER — CARVEDILOL 6.25 MG PO TABS: 6.2500 mg | ORAL_TABLET | Freq: Two times a day (BID) | ORAL | 0 refills | Status: DC

## 2018-05-24 MED FILL — oxyCODONE HCL 5 MG TABS: 5 | 7 days supply | Qty: 30 | Fill #0

## 2018-05-24 MED FILL — FUROSEMIDE 20 MG TAB: 20 | 30 days supply | Qty: 30 | Fill #0

## 2018-05-24 MED FILL — POTASSIUM CHLORIDE ER 10 ME: 10 | 30 days supply | Qty: 30 | Fill #0

## 2018-05-24 MED FILL — LOSARTAN POTASSIUM 25 MG TA: 25 | 30 days supply | Qty: 30 | Fill #0

## 2018-05-24 MED FILL — CARVEDILOL 6.25 MG TABLET: 6.25 | 30 days supply | Qty: 60 | Fill #0

## 2018-05-24 NOTE — Care Management Note (Signed)
Case Management Note Marvetta Gibbons RN, BSN Unit 4E- RN Care Coordinator  [phone]  Patient Details  Name: Sarah Stevenson MRN: [id number] Date of Birth: 19-Mar-1956  Subjective/Objective:   Pt admitted s/p CABGx2, and AVR                Action/Plan: PTA pt lived at home with spouse, anticipate return home, no CM needs noted for transition home, [facility] will follow telephonic.   Expected Discharge Date:  05/24/18               Expected Discharge Plan:  Home/Self Care  In-House Referral:  NA  Discharge planning Services  CM Consult  Post Acute Care Choice:  NA Choice offered to:  NA  DME Arranged:    DME Agency:     HH Arranged:    HH Agency:     Status of Service:  Completed, signed off  If discussed at [place] of Stay Meetings, dates discussed:    Discharge Disposition: home/self care   Additional Comments:  Dawayne Patricia, RN 05/24/2018, 1:29 PM

## 2018-05-24 NOTE — Progress Notes (Addendum)
[address]       [place] [address]             [phone]      7 Days Post-Op Procedure(s) (LRB): CORONARY ARTERY BYPASS GRAFTING (CABG) X 2 WITH ENDOSCOPIC HARVESTING OF RIGHT SAPHENOUS VEIN. LIMA TO LAD. SVG TO RCA (N/A) AORTIC VALVE REPLACEMENT (AVR) USING 21 MM MAGNA EASE PERICARDIAL BIOPROSTHESIS. MODEL # [id number]. SERIAL # [id number] (N/A) TRANSESOPHAGEAL ECHOCARDIOGRAM (TEE) (N/A) LEFT UPPER LOBE LUNG WEDGE RESECTION (Left) Subjective: She is feeling better this morning. She just has some soreness but no real pain. She slept a little better last night.   Objective: Vital signs in last 24 hours: Temp:  [97.8 F (36.6 C)-98.3 F (36.8 C)] 98.1 F (36.7 C) (09/17 0523) Pulse Rate:  [85-99] 91 (09/17 0523) Cardiac Rhythm: Normal sinus rhythm (09/17 0523) Resp:  [14-29] 20 (09/17 0523) BP: (96-126)/(63-81) 101/66 (09/17 0523) SpO2:  [92 %-95 %] 94 % (09/17 0523) Weight:  [92.2 kg] 92.2 kg (09/17 0523)     Intake/Output from previous day: 09/16 0701 - 09/17 0700 In: 220 [P.O.:220] Out: 1200 [Urine:1200] Intake/Output this shift: No intake/output data recorded.  General appearance: alert, cooperative and no distress Heart: regular rate and rhythm, S1, S2 normal, no murmur, click, rub or gallop Lungs: clear to auscultation bilaterally Abdomen: soft, non-tender; bowel sounds normal; no masses,  no organomegaly Extremities: extremities normal, atraumatic, no cyanosis or edema Wound: clean and dry  Lab Results: No results for input(s): WBC, HGB, HCT, PLT in the last 72 hours. BMET:  Recent Labs    05/22/18 0428 05/23/18 0501  NA 140 138  K 3.5 3.9  CL 101 99  CO2 28 27  GLUCOSE 134* 133*  BUN 12 12  CREATININE 0.95 0.94  CALCIUM 8.4* 8.7*    PT/INR: No results for input(s): LABPROT, INR in the last 72 hours. ABG    Component Value Date/Time   PHART 7.317 (L) 05/17/2018 1833   HCO3 21.3 05/17/2018 1833   TCO2 20 (L) 05/18/2018 1934     ACIDBASEDEF 4.0 (H) 05/17/2018 1833   O2SAT 97.0 05/17/2018 1833   CBG (last 3)  Recent Labs    05/23/18 1633 05/23/18 2112 05/24/18 0605  GLUCAP 136* 92 130*    Assessment/Plan: S/P Procedure(s) (LRB): CORONARY ARTERY BYPASS GRAFTING (CABG) X 2 WITH ENDOSCOPIC HARVESTING OF RIGHT SAPHENOUS VEIN. LIMA TO LAD. SVG TO RCA (N/A) AORTIC VALVE REPLACEMENT (AVR) USING 21 MM MAGNA EASE PERICARDIAL BIOPROSTHESIS. MODEL # [id number]. SERIAL # [id number] (N/A) TRANSESOPHAGEAL ECHOCARDIOGRAM (TEE) (N/A) LEFT UPPER LOBE LUNG WEDGE RESECTION (Left)  1. CV-NSR in the 90s. BP well controlled. Continue Coreg and Cozaar. Continue Crestor and Effient.  2. Tolerating room air with good oxygen saturation 3. Creatinine is stable at 0.94. Electrolytes okay. Fluid balance is negative. Will discontinue diuretics.  4. H and H has been stable 5. Blood glucose well controlled 6. Pain is better controlled this morning. She overall feels better than yesterday.   Plan: Probably home today. She is going to walk this morning and see how she feels. She is still short of breath with movement but that has improved.     LOS: 7 days    Elgie Collard 05/24/2018  Feels better today , wound clean and dry . Plan d/c home today  I have seen and examined [name] and agree with the above assessment  and plan.  Grace Isaac MD Beeper [phone]  Office [phone] 05/24/2018 9:06 AM

## 2018-05-24 NOTE — Consult Note (Addendum)
   [facility] CM Inpatient Consult   05/24/2018  MAILA DUKES [birth year] [id number]     Went to bedside to speak with Mrs. [place] on behalf of [employer] [place] Management program for [employer] employees/dependents with [facility] insurance.  Discussed [facility] Telephonic RNCM will call post hospital discharge. Provided 24-hr nurse advice line magnet.   Expresses appreciation of visit.   Marthenia Rolling, MSN-Ed, RN,BSN [facility] Liaison [phone]

## 2018-05-25 ENCOUNTER — Telehealth (HOSPITAL_COMMUNITY): Payer: Self-pay

## 2018-05-25 ENCOUNTER — Other Ambulatory Visit: Payer: Self-pay | Admitting: *Deleted

## 2018-05-25 DIAGNOSIS — E119 Type 2 diabetes mellitus without complications: Secondary | ICD-10-CM | POA: Diagnosis not present

## 2018-05-25 DIAGNOSIS — F5109 Other insomnia not due to a substance or known physiological condition: Secondary | ICD-10-CM | POA: Diagnosis not present

## 2018-05-25 DIAGNOSIS — I10 Essential (primary) hypertension: Secondary | ICD-10-CM | POA: Diagnosis not present

## 2018-05-25 DIAGNOSIS — I2510 Atherosclerotic heart disease of native coronary artery without angina pectoris: Secondary | ICD-10-CM | POA: Diagnosis not present

## 2018-05-25 NOTE — Patient Outreach (Signed)
[facility]) Care Management  05/25/2018  Sarah Stevenson 1956-09-03 [id number]   Subjective: Telephone call to patient's home  number, no answer, left HIPAA compliant voicemail message, and requested call back.    Objective: Per KPN (Knowledge Performance Now, point of care tool) and chart review, patient hospitalized 05/17/18 - 05/24/18 for CAD, status post CORONARY ARTERY BYPASS GRAFTING (CABG) X 2 WITH ENDOSCOPIC HARVESTING OF RIGHT SAPHENOUS VEIN. LIMA TO LAD. SVG TO RCA (N/A) AORTIC VALVE REPLACEMENT (AVR) USING 21 MM MAGNA EASE PERICARDIAL BIOPROSTHESIS. MODEL # [id number]. SERIAL # [id number] (N/A) TRANSESOPHAGEAL ECHOCARDIOGRAM (TEE) (N/A) LEFT UPPER LOBE LUNG WEDGE RESECTION  On 05/17/18.   Patient hospitalized 04/23/17 - 04/25/17 for Primary osteoarthritis and degenerative joint disease of left hip. Status post Left total hip arthroplasty through direct anterior approach on 04/23/17. Patient also has a history of: CAD, hyperlipidemia, Adenocarcinoma of lung, left, and diabetes.    [facility] Care Management completed transition of care follow up on 04/28/17.        Assessment: Received [facility] Transition of care referral on 05/10/18.  Transition of care follow up pending patient contact.      Plan: RNCM will send unsuccessful outreach  letter, [facility] pamphlet, will call patient for 2nd telephone outreach attempt, transition of care follow up, and proceed with case closure, within 10 business days if no return call.      Lucero Ide H. Annia Friendly, BSN, [place] Management [facility] Telephonic CM Phone: [phone] Fax: [phone]

## 2018-05-25 NOTE — Telephone Encounter (Signed)
Pt insurance is active and benefits verified through [facility]. Co-pay $0.00, DED $1,400.00/$1,400.00 met, out of pocket $4,000.00/$4,000.00 met, co-insurance 0%. No pre-authorization. Nicole/[facility], 05/25/18 @ 11:53AM, BUL#[id number]  Will contact patient to see if she is interested in the Cardiac Rehab Program. If interested, patient will need to complete follow up appt. Once completed, patient will be contacted for scheduling upon review by the RN Navigator.

## 2018-05-25 NOTE — Telephone Encounter (Signed)
Attempted to call patient in regards to Cardiac Rehab - LM on VM 

## 2018-05-26 ENCOUNTER — Encounter: Payer: Self-pay | Admitting: *Deleted

## 2018-05-26 ENCOUNTER — Other Ambulatory Visit: Payer: Self-pay | Admitting: *Deleted

## 2018-05-26 LAB — TYPE AND SCREEN
ABO/RH(D): B POS
Antibody Screen: NEGATIVE
Unit division: 0
Unit division: 0
Unit division: 0
Unit division: 0

## 2018-05-26 LAB — BPAM RBC
Blood Product Expiration Date: 201910012359
Blood Product Expiration Date: 201910012359
Blood Product Expiration Date: 201910012359
Blood Product Expiration Date: 201910032359
ISSUE DATE / TIME: 201909101037
ISSUE DATE / TIME: 201909101037
Unit Type and Rh: 7300
Unit Type and Rh: 7300
Unit Type and Rh: 7300
Unit Type and Rh: 7300

## 2018-05-26 MED FILL — TRULICITY 0.75 MG/0.5 ML PE: 0.75 | 84 days supply | Qty: 6 | Fill #0

## 2018-05-26 MED FILL — ZOLPIDEM TARTRATE 10 MG TAB: 10 | 30 days supply | Qty: 30 | Fill #0

## 2018-05-26 NOTE — Patient Outreach (Signed)
[facility]) Care Management  05/26/2018  Sarah Stevenson 05/03/1956 [id number]   Subjective: Telephone call to patient's home number, spoke with patient, and HIPAA verified.  Discussed [facility] Care Management UMR Transition of care follow up, patient voiced understanding, and is in agreement to follow up.  Patient she is doing okay, having good / bad days, very sensitive to medications, having headaches, headaches decrease with medication, and caffeine intake.  States her chest soreness and sleeping is some better.  States she had a hospital follow up appointment with primary MD on 05/25/18, aware of above signs/ symptoms, and appointment went well.   States surgeon is also aware of above signs/ symptoms, has a follow up appointment on 05/31/18 , and 06/20/18 with surgeon.   She is aware of signs / symptoms to report to MD and will have additional follow up with MD if she is not feeling better, on 05/27/18.  Patient states she is able to manage self care and has assistance as needed with activities of daily living / home management.  Patient voices understanding of medical diagnosis, surgery, and treatment plan. States she was newly diagnosed with lung cancer, is being referred to the [facility], and will follow up with available community resources at the [facility] as needed. .  States taking Crestor 3 times per week due to increased joint pain and is being referred to [facility] Clinic for cholesterol management. States she is also participating in a heart disease / cholesterol/ diabetes research study at [facility]. States she is accessing the following [facility] benefits: outpatient pharmacy, hospital indemnity (will file claim if appropriate, verbally given contact number for [employer] [phone] to request itemized bill) and family medical leave act (FMLA) in place.  Patient also has questions regarding supplemental cancer policy, advised to follow up [facility] (verbally given contact number [phone]), patient voices understanding, and states she will follow up.   Discussed diabetes disease management resources for [employer], patient voices understanding, and states she is already enrolled with Active Health Management program.  Patient states he does not have any education material, transition of care, care coordination, transportation, community resource, or pharmacy needs at this time.   States she is very appreciative of the follow up and is in agreement to receive [place] Management information.     Objective: Per KPN (Knowledge Performance Now, point of care tool) and chart review, patient hospitalized 05/17/18 - 05/24/18 for CAD, status post CORONARY ARTERY BYPASS GRAFTING (CABG) X 2 WITH ENDOSCOPIC HARVESTING OF RIGHT SAPHENOUS VEIN. LIMA TO LAD. SVG TO RCA (N/A) AORTIC VALVE REPLACEMENT (AVR) USING 21 MM MAGNA EASE PERICARDIAL BIOPROSTHESIS. MODEL # 3300TFX. SERIAL # [id number] (N/A) TRANSESOPHAGEAL ECHOCARDIOGRAM (TEE) (N/A) LEFT UPPER LOBE LUNG WEDGE RESECTION  On 05/17/18.   Patient hospitalized 04/23/17 - 04/25/17 for Primary osteoarthritis and degenerative joint disease of left hip. Status post Left total hip arthroplasty through direct anterior approach on 04/23/17. Patient also has a history of: CAD, hyperlipidemia, Adenocarcinoma of lung, left, and diabetes.    [facility] Care Management completed transition of care follow up on 04/28/17.        Assessment: Received UMR Transition of care referral on 05/10/18. Transition of care follow up completed, no care management needs, and will proceed with case closure.     Plan:RNCM will send patient successful outreach letter, [facility] pamphlet, and magnet. RNCM will complete case closure due to follow up completed / no care management  needs.      Kenia Teagarden H. Annia Friendly, BSN, [place] Management [facility] Telephonic CM Phone: [phone] Fax: [phone]

## 2018-05-29 NOTE — Op Note (Signed)
NAME: Sarah Stevenson, Sarah Stevenson MEDICAL RECORD [id number] ACCOUNT [id number] DATE OF BIRTH:05/27/1956 FACILITY: [facility] LOCATION: [place], [place]  OPERATIVE REPORT  DATE OF PROCEDURE:  05/17/2018  PREOPERATIVE DIAGNOSES:  Severe aortic stenosis and coronary occlusive disease.  POSTOPERATIVE DIAGNOSES: 1.  Severe aortic stenosis with a trileaflet aortic valve and coronary occlusive disease. 2.  Aortic stenosis with a trileaflet aortic valve 3.  Coronary occlusive disease. 4.  Left upper lobe adenocarcinoma of the lung.  PROCEDURES PERFORMED:   1.  Aortic valve replacement with pericardial tissue valve, [employer] model 3300 TFX 21 mm, serial number [id number]. 2.  Coronary artery bypass grafting x2 with the left internal mammary to the left anterior descending coronary artery and reverse saphenous vein graft to the distal right coronary artery with right thigh endovein harvesting.   3.  Wedge resection, left upper lobe lung lesion.  SURGEON:  Lanelle Bal, MD  FIRST ASSISTANT:  Nicholes Rough, PA.  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  Full evaluation including echocardiogram showed evidence of aortic stenosis with a normal ejection fraction,  mean aortic valve gradient of 41-45 mm and a peak gradient of 76 with estimated valve area of 0.7 and a velocity of greater than 4 cm per second.  Initial preoperative echocardiogram was interpreted as a bicuspid aortic valve.  The final anatomy at the  time of surgery showed a trileaflet valve, coronary occlusive disease was also noted on cardiac catheterization with high-grade proximal LAD lesion and 60% lesion of the right coronary artery.  The circumflex was free of disease.  The patient was  referred for consideration of coronary artery bypass grafting and aortic valve replacement.  On discussion of types of  valves and risks of anticoagulation, the patient did not wish to have a mechanical valve and she required to take lifelong Coumadin.   Because of the initial thought that the patient had a bicuspid valve, we obtained a CTA of the chest to evaluate the size of her ascending aorta which was not evaluated fully at the time of heart catheterization.  Aorta was of normal size, but there was  a 1 cm ground glass opacity noted in the left upper lobe.  With the patient's symptoms with minimal exertion, high grade aortic stenosis and coronary occlusive disease.  Surgery was recommended to the patient.  Risks and options were discussed in detail,  and she agreed and signed informed consent.  DESCRIPTION OF PROCEDURE:  The patient underwent general endotracheal anesthesia without incident.  Skin the chest and legs was prepped with Betadine, draped in the usual sterile manner.  Appropriate timeout was performed and we proceeded first with  endoscopic vein harvesting of the right greater saphenous vein endoscopically.  The right thigh vein was of good quality and caliber.  Median sternotomy was performed.  The left internal mammary artery was dissected down as a pedicle graft.  The distal  artery was hydrostatically dilated with heparinized saline.  The vessel had good free flow with the mammary retractors still in place, we did palpate the left upper lobe to see if we could locate the abnormality on the chest CT.  In the left upper lobe  laterally, there was a small palpable area.  Using a gold load powered stapler, a wedge resection was performed.  Immediate frozen section confirmed adenocarcinoma, though there was some microscopic disease at the staple margin.  For this reason,  additional margin was stapled and submitted to pathology.  The final margin was noted to be negative.  A TEE probe had been placed by anesthesia, which confirmed echocardiographic findings preoperatively consistent with severe aortic stenosis  and  preserved LV function.  The pericardium was opened.  The patient was systemically heparinized.  The ascending aorta was cannulated.  The right atrium was cannulated.  An aortic root vent cardioplegia needle was introduced into the ascending aorta.   Retrograde cardioplegia catheter was placed through a separate site in the right atrium.  The patient was then placed on cardiopulmonary bypass at 2.4 liters per minute per meter square.   A right superior pulmonary vein vent was placed.  The patient's  body temperature was cooled to 32 degrees.  Aortic crossclamp was applied and 500 mL of cold blood potassium cardioplegia was administered antegrade.  Additional cold blood cardioplegia was administered retrograde.  Attention was turned first to the  coronary disease.  The distal right coronary artery was opened and admitted a 1.5 mm probe distally.  Using a running 7-0 Prolene, distal anastomosis was performed with a segment of reverse saphenous vein graft.  Additional cold blood cardioplegia was  administered down the vein graft.  The heart was then elevated and the left anterior descending coronary artery was identified distally between the mid and distal third of the vessel, the LAD was opened.  A 0.5 mm probe passed proximally and distally.   Using a running 8-0 Prolene, the left internal mammary artery was anastomosed to left anterior descending coronary artery.  We then turned our attention to the aortic valve.  A transverse aortotomy was performed.  This gave good exposure of a trileaflet,  severely calcified aortic valve.  The aortic root was not dilated.  The aortic valve was excised and the annulus debrided.  The valve was sized for a 21 pericardial tissue valve.  Number 2 Ti-Cron pledgeted sutures were placed circumferentially on the  ventricular surface.  Care was taken to remove all loose calcific debris and the sutures were used to secure and seat the valve in place.  This is an Psychologist, forensic 3300 TFX 21 mm valve, serial [id number].  The valve seated well without  impingement on the right or left coronary ostium.  The ascending aorta was carefully inspected anteriorly for any loose calcific debris and then we proceeded to close the aortic root with horizontal mattress 4-0 Prolenes over felt strips.  A second layer  of running 4-0 Prolene was used to reinforce the closure.  The aortic root vent cardioplegia needle was then removed and a punch aortotomy was performed.  At this site the vein graft to the distal right coronary artery was then anastomosed to the  ascending aorta.  Before complete closure of the aortic root, the heart was allowed to passively fill and deair.  The bulldog on the mammary artery was removed with prompt rise in myocardial septal temperature during the procedure.  CO2 had been  instilled into the pericardial cavity.  The proximal anastomosis was completed.  An aortic root vent was removed with total crossclamp time of 125 minutes.  The retrograde cardioplegia catheter was removed.  The patient spontaneously converted to a sinus  rhythm.  Sites of anastomosis were inspected and free of bleeding.  With the patient's body temperature rewarmed to 37 degrees, atrial and ventricular pacing wires were applied.  TEE showed good functioning of the aortic prosthesis with a mean  gradient  of less than 10.  Right superior pulmonary vein vent was removed.  The patient was then ventilated and weaned from cardiopulmonary bypass without difficulty.  She remained hemodynamically stable, was decannulated in the usual fashion.  Protamine sulfate  was administered with operative field hemostatic, atrial and ventricular pacing wires were applied.  Graft markers were applied.  A left pleural tube and Blake mediastinal drain were left in place.  The sternum was closed with #6 stainless steel wire.   Fascia was closed with interrupted 0 Vicryl, running 3-0 Vicryl, subcutaneous  tissue, 4-0 subcuticular stitch in skin edges.  Dry dressings were applied.  Sponge and needle count was reported as correct at completion of the procedure.    The patient tolerated the procedure without obvious complication and was transferred to the Surgical Intensive Care Unit for further postoperative care.  Total pump time was 171 minutes.  AN/NUANCE  D:05/29/2018 T:05/29/2018 JOB:[id number]

## 2018-05-31 ENCOUNTER — Encounter (INDEPENDENT_AMBULATORY_CARE_PROVIDER_SITE_OTHER): Payer: Self-pay | Admitting: *Deleted

## 2018-05-31 DIAGNOSIS — Z951 Presence of aortocoronary bypass graft: Secondary | ICD-10-CM

## 2018-05-31 DIAGNOSIS — Z09 Encounter for follow-up examination after completed treatment for conditions other than malignant neoplasm: Secondary | ICD-10-CM

## 2018-05-31 DIAGNOSIS — Z4802 Encounter for removal of sutures: Secondary | ICD-10-CM

## 2018-05-31 DIAGNOSIS — I2510 Atherosclerotic heart disease of native coronary artery without angina pectoris: Secondary | ICD-10-CM

## 2018-05-31 DIAGNOSIS — I350 Nonrheumatic aortic (valve) stenosis: Secondary | ICD-10-CM

## 2018-05-31 DIAGNOSIS — Z952 Presence of prosthetic heart valve: Secondary | ICD-10-CM

## 2018-05-31 DIAGNOSIS — C3412 Malignant neoplasm of upper lobe, left bronchus or lung: Secondary | ICD-10-CM

## 2018-06-06 ENCOUNTER — Ambulatory Visit (INDEPENDENT_AMBULATORY_CARE_PROVIDER_SITE_OTHER): Payer: 59 | Admitting: Orthopaedic Surgery

## 2018-06-08 ENCOUNTER — Encounter: Payer: Self-pay | Admitting: Nurse Practitioner

## 2018-06-08 ENCOUNTER — Ambulatory Visit (INDEPENDENT_AMBULATORY_CARE_PROVIDER_SITE_OTHER): Payer: 59 | Admitting: Nurse Practitioner

## 2018-06-08 VITALS — BP 98/60 | HR 73 | Ht 62.5 in | Wt 197.1 lb

## 2018-06-08 DIAGNOSIS — Z789 Other specified health status: Secondary | ICD-10-CM

## 2018-06-08 DIAGNOSIS — Z951 Presence of aortocoronary bypass graft: Secondary | ICD-10-CM

## 2018-06-08 DIAGNOSIS — Z952 Presence of prosthetic heart valve: Secondary | ICD-10-CM

## 2018-06-08 LAB — CBC
Hematocrit: 30.3 % — ABNORMAL LOW (ref 34.0–46.6)
Hemoglobin: 10 g/dL — ABNORMAL LOW (ref 11.1–15.9)
MCH: 29.5 pg (ref 26.6–33.0)
MCHC: 33 g/dL (ref 31.5–35.7)
MCV: 89 fL (ref 79–97)
Platelets: 211 10*3/uL (ref 150–450)
RBC: 3.39 x10E6/uL — ABNORMAL LOW (ref 3.77–5.28)
RDW: 13.4 % (ref 12.3–15.4)
WBC: 6.7 10*3/uL (ref 3.4–10.8)

## 2018-06-08 LAB — BASIC METABOLIC PANEL
BUN/Creatinine Ratio: 16 (ref 12–28)
BUN: 17 mg/dL (ref 8–27)
CO2: 21 mmol/L (ref 20–29)
Calcium: 9.6 mg/dL (ref 8.7–10.3)
Chloride: 102 mmol/L (ref 96–106)
Creatinine, Ser: 1.05 mg/dL — ABNORMAL HIGH (ref 0.57–1.00)
GFR calc Af Amer: 66 mL/min/{1.73_m2} (ref >59)
GFR calc non Af Amer: 57 mL/min/{1.73_m2} — ABNORMAL LOW (ref >59)
Glucose: 153 mg/dL — ABNORMAL HIGH (ref 65–99)
Potassium: 4.9 mmol/L (ref 3.5–5.2)
Sodium: 139 mmol/L (ref 134–144)

## 2018-06-08 NOTE — Patient Instructions (Addendum)
We will be checking the following labs today - BMET & CBC   Medication Instructions:    Continue with your current medicines. BUT  I would like to stop the Losartan - monitor your BP for me - restart if consistently over 121 systolic.     Testing/Procedures To Be Arranged:  Echocardiogram  Follow-Up:   See Dr. Tamala Julian or me in 4 weeks with fasting labs  Referral to lipid clinic to discuss options    Other Special Instructions:   I have spoken to Dr. Everrett Coombe office about scheduling PET scan  I have sent a message to cardiac rehab.     If you need a refill on your cardiac medications before your next appointment, please call your pharmacy.   Call the [place] office at [phone] if you have any questions, problems or concerns.

## 2018-06-08 NOTE — Progress Notes (Signed)
CARDIOLOGY OFFICE NOTE  Date:  06/08/2018    Sarah Stevenson Date of Birth: 1956-08-02 Medical Record #[id number]  PCP:  Christain Sacramento, MD  Cardiologist:  Jennings Books    Chief Complaint  Patient presents with  . Coronary Artery Disease  . Aortic Stenosis    Post CABG/AVR - seen for Dr. Tamala Julian    History of Present Illness: Sarah Stevenson is a 62 y.o. female who presents today for a post hospital visit. Seen for Dr. Tamala Julian.   She has a history of CAD with prior RCA DES in March of 2017 and to the mid RCA in November 2017 in the setting of angina, AS, DM, HLD, and OA. She has a bicuspid aortic valve. She had residual LAD disease from last cath in November of 2017.   She was seen by me back in July - noting progressive symptoms over the prior 6 months. Cardiac cath was recommended. Found to have worsening AS as well as CAD and was referred to [facility] for surgical consideration.   She underwent her surgery in September. She had CABG x 2 with LIMA to LAD and SVG to RCA. She also underwent AVR with a #32mm Magna Ease pericardial valve. She also had a lung resection with adenocarcinoma.  Post op course was uneventful. Some volume overload and hypertension as well as expected anemia. Discharged to home.   Comes in today. Here with her friend Fritz Pickerel. Home a few weeks. Feels pretty rough. Had lots of headaches initially. This has gotten better. Having "flashes" in her eyes - was told this was optical migraines. This is not a new issue - seemed to be worse after surgery - but has improved. Was started on Ambien by her PCP to help with sleep. Dizzy. She has been walking. She is interested in rehab. She cleaned her house yesterday - actually mopped and vacuumed yesterday - despite knowing that she should not. Not using any pain medicine for the most part but occasionally. Feels like Tylenol causes ringing in her ears. She feels short of breath. Has had some cough. Weight is down. She is  sneezing - feels this is allergy related. She does not like taking the Crestor - taking 20 mg three times a week but says "I will have to go on disability if I stay on this". Sounds like she has done ok with Lipitor in the past but this seems questionable to me. She was told she would be sent to the lipid clinic. Has not started Trulicity back - says she forgot. Very upset about her cancer diagnosis - asking about timing of PET scan, referral to oncology, etc. Cost is a big issue. BP running low at home. Little short of breath.   Past Medical History:  Diagnosis Date  . Anginal pain ([place])   . Aortic stenosis   . Coronary artery disease   . Depression    "mild" (08/06/2016)  . Dyspnea   . Hyperlipidemia   . Murmur, cardiac   . Myocardial infarction ([place]) 2016  . Osteoarthritis    "left hip and knee" (08/06/2016)  . Type II diabetes mellitus ([place])     Past Surgical History:  Procedure Laterality Date  . AORTIC VALVE REPLACEMENT N/A 05/17/2018   Procedure: AORTIC VALVE REPLACEMENT (AVR) USING 21 MM MAGNA EASE PERICARDIAL BIOPROSTHESIS. MODEL # [id number]. SERIAL # [id number];  Surgeon: Grace Isaac, MD;  Location: [facility];  Service: Open Heart Surgery;  Laterality:  N/A;  . CARDIAC CATHETERIZATION Right 11/30/2014   Procedure: RIGHT/LEFT HEART CATH AND CORONARY ANGIOGRAPHY;  Surgeon: Belva Crome, MD;  Location: [facility] CATH LAB;  Service: Cardiovascular;  Laterality: Right;  . CARDIAC CATHETERIZATION N/A 12/13/2015   Procedure: Left Heart Cath and Coronary Angiography;  Surgeon: Leonie Man, MD;  Location: [facility] CV LAB;  Service: Cardiovascular;  Laterality: N/A;  . CARDIAC CATHETERIZATION N/A 12/13/2015   Procedure: Intravascular Pressure Wire/FFR Study;  Surgeon: Leonie Man, MD;  Location: [facility] CV LAB;  Service: Cardiovascular;  Laterality: N/A;  RCA  . CARDIAC CATHETERIZATION N/A 08/06/2016   Procedure: Right/Left Heart Cath and Coronary Angiography;  Surgeon: Lorretta Harp,  MD;  Location: [facility] CV LAB;  Service: Cardiovascular;  Laterality: N/A;  . CARDIAC CATHETERIZATION N/A 08/06/2016   Procedure: Coronary Stent Intervention;  Surgeon: Lorretta Harp, MD;  Location: [facility] CV LAB;  Service: Cardiovascular;  Laterality: N/A;   distal rca 3.0x16 and 3.0x8 synergy  . CESAREAN SECTION  1989  . CORONARY ANGIOPLASTY WITH STENT PLACEMENT  08/06/2016   "2 stents"  . CORONARY ARTERY BYPASS GRAFT N/A 05/17/2018   Procedure: CORONARY ARTERY BYPASS GRAFTING (CABG) X 2 WITH ENDOSCOPIC HARVESTING OF RIGHT SAPHENOUS VEIN. LIMA TO LAD. SVG TO RCA;  Surgeon: Grace Isaac, MD;  Location: [facility];  Service: Open Heart Surgery;  Laterality: N/A;  . HYSTEROSCOPY DIAGNOSTIC  1990s  . JOINT REPLACEMENT    . KNEE ARTHROSCOPY Left 1996  . NASAL SINUS SURGERY  1977  . RIGHT/LEFT HEART CATH AND CORONARY ANGIOGRAPHY N/A 04/01/2018   Procedure: RIGHT/LEFT HEART CATH AND CORONARY ANGIOGRAPHY;  Surgeon: Belva Crome, MD;  Location: [facility] CV LAB;  Service: Cardiovascular;  Laterality: N/A;  . TEE WITHOUT CARDIOVERSION N/A 05/17/2018   Procedure: TRANSESOPHAGEAL ECHOCARDIOGRAM (TEE);  Surgeon: Grace Isaac, MD;  Location: [facility];  Service: Open Heart Surgery;  Laterality: N/A;  . TOTAL HIP ARTHROPLASTY Left 04/23/2017   Procedure: LEFT TOTAL HIP ARTHROPLASTY ANTERIOR APPROACH;  Surgeon: Mcarthur Rossetti, MD;  Location: [facility];  Service: Orthopedics;  Laterality: Left;  . WEDGE RESECTION Left 05/17/2018   Procedure: LEFT UPPER LOBE LUNG WEDGE RESECTION;  Surgeon: Grace Isaac, MD;  Location: [facility];  Service: Open Heart Surgery;  Laterality: Left;     Medications: Current Meds  Medication Sig  . acetaminophen (TYLENOL) 500 MG tablet Take 500-1,000 mg by mouth 2 (two) times daily as needed for moderate pain.  . carvedilol (COREG) 6.25 MG tablet Take 1 tablet (6.25 mg total) by mouth 2 (two) times daily with a meal.  . cetirizine (ZYRTEC) 10 MG tablet  Take 10 mg by mouth daily.  . fluticasone (FLONASE) 50 MCG/ACT nasal spray Place 1 spray into both nostrils daily as needed for allergies or rhinitis.  Marland Kitchen glipiZIDE (GLUCOTROL XL) 10 MG 24 hr tablet Take 10 mg by mouth daily with breakfast.   . Menthol-Methyl Salicylate (MUSCLE RUB EX) Apply 1 application topically as needed (for pain).  . metFORMIN (GLUCOPHAGE-XR) 750 MG 24 hr tablet Take 1,500 mg by mouth every evening.  Marland Kitchen oxyCODONE (OXY IR/ROXICODONE) 5 MG immediate release tablet Take 1 tablet (5 mg total) by mouth every 6 (six) hours as needed for severe pain.  . prasugrel (EFFIENT) 10 MG TABS tablet Take 1 tablet (10 mg total) by mouth daily.  . ranitidine (ZANTAC) 150 MG tablet Take 150 mg by mouth at bedtime.   . rosuvastatin (CRESTOR) 40 MG  tablet Take 20 mg by mouth 3 (three) times a week.   . sertraline (ZOLOFT) 50 MG tablet Take 25 mg by mouth daily.   . TRULICITY 0.10 UV/2.5DG SOPN Inject 0.75 mg into the skin once a week.   . zolpidem (AMBIEN) 10 MG tablet Take 10 mg by mouth at bedtime as needed for sleep.   . [DISCONTINUED] losartan (COZAAR) 25 MG tablet Take 1 tablet (25 mg total) by mouth daily.     Allergies: Allergies  Allergen Reactions  . Brilinta [Ticagrelor] Shortness Of Breath  . Lisinopril Cough  . Asa [Aspirin] Hives and Rash  . Blue Dyes (Parenteral) Hives  . Corn-Containing Products Hives  . Hetastarch Hives and Rash  . Nsaids Hives and Rash  . Sugar-Protein-Starch Hives  . Tea Hives  . Isosulfan Blue Rash  . Other Itching and Rash    CHG soap    Social History: The patient  reports that she quit smoking about 37 years ago. Her smoking use included cigarettes. She has a 10.00 pack-year smoking history. She has never used smokeless tobacco. She reports that she drinks about 7.0 standard drinks of alcohol per week. She reports that she does not use drugs.   Family History: The patient's family history includes Heart attack in her father and mother;  Sudden death in her father.   Review of Systems: Please see the history of present illness.   Otherwise, the review of systems is positive for none.   All other systems are reviewed and negative.   Physical Exam: VS:  BP 98/60 (BP Location: Left Arm, Patient Position: Sitting, Cuff Size: Large)   Pulse 73   Ht 5' 2.5" (1.588 m)   Wt 197 lb 1.9 oz (89.4 kg)   BMI 35.48 kg/m  .  BMI Body mass index is 35.48 kg/m.  Wt Readings from Last 3 Encounters:  06/08/18 197 lb 1.9 oz (89.4 kg)  05/24/18 203 lb 4.2 oz (92.2 kg)  05/12/18 211 lb 9.6 oz (96 kg)    General: Alert and in no acute distress.  She weighed 212 when I saw her in July.  HEENT: Normal.  Neck: Supple, no JVD, carotid bruits, or masses noted.  Cardiac: Regular rate and rhythm.  Sternum looks ok. No real murmur noted.  No edema.  Respiratory:  Lungs are clear to auscultation bilaterally with normal work of breathing.  GI: Soft and nontender.  MS: No deformity or atrophy. Gait and ROM intact.  Skin: Warm and dry. Color is normal.  Neuro:  Strength and sensation are intact and no gross focal deficits noted.  Psych: Alert, appropriate and with normal affect.   LABORATORY DATA:  EKG:  EKG is ordered today. This demonstrates NSR with T wave changes in III, V2.  Lab Results  Component Value Date   WBC 9.9 05/21/2018   HGB 9.6 (L) 05/21/2018   HCT 29.9 (L) 05/21/2018   PLT 194 05/21/2018   GLUCOSE 133 (H) 05/23/2018   CHOL 153 05/11/2017   TRIG 281 (H) 05/11/2017   HDL 29 (L) 05/11/2017   LDLCALC 68 05/11/2017   ALT 19 05/12/2018   AST 17 05/12/2018   NA 138 05/23/2018   K 3.9 05/23/2018   CL 99 05/23/2018   CREATININE 0.94 05/23/2018   BUN 12 05/23/2018   CO2 27 05/23/2018   INR 1.53 05/17/2018   HGBA1C 9.3 (H) 05/12/2018     BNP (last 3 results) No results for input(s): BNP in the last  8760 hours.  ProBNP (last 3 results) No results for input(s): PROBNP in the last 8760 hours.   Other Studies  Reviewed Today:  PROCEDURE:Procedure(s) 05/17/2018:  CORONARY ARTERY BYPASS GRAFTING (CABG) X 2 WITH ENDOSCOPIC HARVESTING OF RIGHT SAPHENOUS VEIN. LIMA TO LAD. SVG TO RCA (N/A) AORTIC VALVE REPLACEMENT (AVR) USING 21 MM MAGNA EASE PERICARDIAL BIOPROSTHESIS. MODEL # 3300TFX. SERIAL # [id number] (N/A) TRANSESOPHAGEAL ECHOCARDIOGRAM (TEE) (N/A) LEFT UPPER LOBE LUNG WEDGE RESECTION (Left)   Intraop EE Conclusions 05/2018  Result status: Final result   Left atrium: Cavity is mildly dilated.  Aortic valve: The valve is trileaflet. Severe valve thickening present. Severe valve calcification present. Severely decreased leaflet separation. Severe stenosis. No regurgitation.  Mitral valve: Trace regurgitation.  Right ventricle: Normal cavity size, wall thickness and ejection fraction.  Septum: No Patent Foramen Ovale present.  Left atrium: Patent foramen ovale not present.     RIGHT/LEFT HEART CATH AND CORONARY ANGIOGRAPHY 72019  Conclusion    Calcific aortic stenosis, moderately severe with calculated aortic valve area 0.92 cm.  Peak to peak gradient 45 mmHg.  Two-vessel coronary artery disease with 70% mid LAD stenosis and 50% in-stent restenosis in the right coronary within the region of overlapping double layer stent.  Widely patent circumflex artery.  Normal left main.  Normal pulmonary pressures and left ventricular hemodynamics.  Limiting exertional dyspnea secondary to aortic stenosis predominantly with contribution from CAD.  Recommendations:   Aortic valve replacement  LIMA to LAD and SVG to RCA  Patient has an appointment to see Dr. Servando Snare.     Assessment/Plan: 1. Recent CABG x 2 with AVR - slow progress but steady. Lab today. Reminded about activity level and lifting restrictions. Message sent to cardiac rehab. BP soft - will hold ARB for now. Explained that we will need aggressive CV risk factor modification going forward. She is quite overwhelmed at this  time with everything that has happened. Will get echo post AVR. Reminded about SBE.     2. Lung resection for adenocarcinoma - message sent to [facility] about timing of PET scan - she is anxious to proceed and see oncology if needed.   3. HTN - BP soft - stopping losartan - she will monitor and restart if BP consistently over 174 systolic.   4. HLD - on Crestor -  No recent lipids - would not check now due to recent surgery - needs on return. Will refer to lipid clinic.   5. DM - poorly controlled.   Current medicines are reviewed with the patient today.  The patient does not have concerns regarding medicines other than what has been noted above.  The following changes have been made:  See above.  Labs/ tests ordered today include:    Orders Placed This Encounter  Procedures  . Basic metabolic panel  . CBC  . EKG 12-[place]  . ECHOCARDIOGRAM COMPLETE     Disposition:   FU with Dr. Tamala Julian and Dr. Servando Snare as planned.    Patient is agreeable to this plan and will call if any problems develop in the interim.   SignedTruitt Merle, NP  06/08/2018 10:28 AM  [facility] [address] [place], [place]  [address] Phone: [phone] Fax: [phone]

## 2018-06-10 ENCOUNTER — Telehealth (HOSPITAL_COMMUNITY): Payer: Self-pay

## 2018-06-10 NOTE — Telephone Encounter (Signed)
Called and spoke with patient in regards to Cardiac Rehab - Patient is interested in participating in the program. Scheduled orientation on 08/09/18 at 8:30am. Patient will attend the 9:45am exc class. Patient is in a finanical bind right now and wil need to drop from program at the end of the year. I put patient on the Wait List to potentially get her in quicker to give her plenty of exc sessions.  Mailed packet.

## 2018-06-16 ENCOUNTER — Ambulatory Visit (HOSPITAL_COMMUNITY): Payer: 59 | Attending: Internal Medicine

## 2018-06-16 ENCOUNTER — Other Ambulatory Visit: Payer: Self-pay

## 2018-06-16 DIAGNOSIS — Z951 Presence of aortocoronary bypass graft: Secondary | ICD-10-CM | POA: Insufficient documentation

## 2018-06-16 DIAGNOSIS — Z952 Presence of prosthetic heart valve: Secondary | ICD-10-CM | POA: Diagnosis not present

## 2018-06-16 MED ORDER — PERFLUTREN LIPID MICROSPHERE
1.0000 mL | INTRAVENOUS | Status: AC | PRN
  Administered 2018-06-16: 2 mL via INTRAVENOUS

## 2018-06-19 NOTE — Progress Notes (Signed)
Patient ID: Sarah Stevenson                 DOB: May 13, 1956                    MRN: [id number]     HPI: Sarah Stevenson is a 62 y.o. female patient referred to lipid clinic by Truitt Merle, and who sees Dr. Tamala Julian. PMH is significant for CAD s/p DES 11/2014 and 07/2016, AS, DM, HLD, OA. She underwent CABG x2, AVR, and left upper lobe resection 05/2018.   She presents to clinic today in good spirits. Patient with history of intolerances to antihyperlipidemic medications including atorvastatin 20, 40 and 80 mg (dealt with the pain for a while when switched to generic, switched to rosuvastatin because ineffective) a nd rosuvastatin 40 mg TIW (starting in June); hx 40 daily (hip/leg pain, stopped crestor and pain disappeared two days later, pain recurred when restarted, decreased dose to three times weekly for better tolerability). She reports her pain is at a more tolerable level but persists despite frequency decrease. Previously enrolled in [place] study with chelation therapy, looking at CV disease and diabetes for CV outcomes, but has stopped therapy since surgery.   Patient hesitant to start injection therapy at this time as she previously had a reaction to Bydureon after three injections, rash/large rased welts around injection site. Patient currently receiving Trulicity and has not had any reactions at this time.   Current Medications: rosuvastatin 40 mg three times weekly Intolerances: Atorvastatin 20, 40 and 80 mg (dealt with the pain for a while, but hip/leg pain)  Rosuvastatin 20 mg TIW; hx 40 daily (hip/leg pain, stopped crestor and pain disappeared two days later, pain recurred when restarted) Risk Factors: CAD and CABG x2, family history (mother, father, brothers) LDL goal: <70  Diet: Breakfast: cinnamon bagel with peanut, 2 cups of coffee with cream, eggs twice a week; Lunch: peanut butter/banana sandwich; Dinner: tater tot casserole, spaghetti with ground [place]; Vegetables once a day  (broccoli, salad, carrots); tries to cook at home, likes to use [employer]  Exercise: Walk but limited by shortness of breath when going up hills (lung wedge resection during CABG), walks at park 4000-5000 steps/day (previously 10-15000 steps/day). Will start cardiac rehab in December.   Family History: The patient's family history includes heart attack in her father (25) and mother (63, underwent CABG); Sudden death in her father (at 51); Brother cardiomyopathy, COPD, emphysema, MI (29); Another brother- multiple strokes (viral encephalitis?), bilateral carotid stenosis   Social History: (-) on/off tobacco, quit ~4 years ago; (+) alcohol, 7 drinks/week  Labs: 09/20/2017: Tot 133, HDL 32, LDL 82, TG 252    (rosuvastatin 40 mg daily) 05/11/2017: Tot 153, HDL 29, LDL 68, TG 201    (rosuvastatin 40 mg daily) 03/22/2017: Tot 213, HDL 36, LDL 141, TG 262  (atorvastatin 40 mg daily)   Past Medical History:  Diagnosis Date  . Anginal pain ([place])   . Aortic stenosis   . Coronary artery disease   . Depression    "mild" (08/06/2016)  . Dyspnea   . Hyperlipidemia   . Murmur, cardiac   . Myocardial infarction ([place]) 2016  . Osteoarthritis    "left hip and knee" (08/06/2016)  . Type II diabetes mellitus ([place])     Current Outpatient Medications on File Prior to Visit  Medication Sig Dispense Refill  . acetaminophen (TYLENOL) 500 MG tablet Take 500-1,000 mg by mouth 2 (two) times daily as  needed for moderate pain.    . carvedilol (COREG) 6.25 MG tablet Take 1 tablet (6.25 mg total) by mouth 2 (two) times daily with a meal. 60 tablet 0  . cetirizine (ZYRTEC) 10 MG tablet Take 10 mg by mouth daily.    . fluticasone (FLONASE) 50 MCG/ACT nasal spray Place 1 spray into both nostrils daily as needed for allergies or rhinitis.    Marland Kitchen glipiZIDE (GLUCOTROL XL) 10 MG 24 hr tablet Take 10 mg by mouth daily with breakfast.     . Menthol-Methyl Salicylate (MUSCLE RUB EX) Apply 1 application topically as needed (for  pain).    . metFORMIN (GLUCOPHAGE-XR) 750 MG 24 hr tablet Take 1,500 mg by mouth every evening.    Marland Kitchen oxyCODONE (OXY IR/ROXICODONE) 5 MG immediate release tablet Take 1 tablet (5 mg total) by mouth every 6 (six) hours as needed for severe pain. 30 tablet 0  . prasugrel (EFFIENT) 10 MG TABS tablet Take 1 tablet (10 mg total) by mouth daily. 90 tablet 2  . ranitidine (ZANTAC) 150 MG tablet Take 150 mg by mouth at bedtime.     . rosuvastatin (CRESTOR) 40 MG tablet Take 20 mg by mouth 3 (three) times a week.     . sertraline (ZOLOFT) 50 MG tablet Take 25 mg by mouth daily.     . TRULICITY 7.06 CB/7.6EG SOPN Inject 0.75 mg into the skin once a week.   3  . zolpidem (AMBIEN) 10 MG tablet Take 10 mg by mouth at bedtime as needed for sleep.      No current facility-administered medications on file prior to visit.     Allergies  Allergen Reactions  . Brilinta [Ticagrelor] Shortness Of Breath  . Lisinopril Cough  . Asa [Aspirin] Hives and Rash  . Blue Dyes (Parenteral) Hives  . Corn-Containing Products Hives  . Hetastarch Hives and Rash  . Nsaids Hives and Rash  . Sugar-Protein-Starch Hives  . Tea Hives  . Isosulfan Blue Rash  . Other Itching and Rash    CHG soap    Assessment/Plan:  1. Hyperlipidemia - LDL above goal on previous regimen of rosuvastatin 40 mg daily. Patient experiencing significant hip/leg pain on rosuvastatin, which stopped when she discontinued therapy. The pain has decreased by persists on a lower dose of rosuvastatin. LDL likely further above goal on current regimen of rosuvastatin 40 mg three times weekly. Will start patient on rosuvastatin 10 mg daily, with the hopes to increase dose. Will call patient in two weeks to assess tolerability and consider addition of Zetia. Will plan to recheck fasting lipids ~4 weeks after final regimen has been decided and schedule clinic visit as appropriate. If LDL not < 70 at that time, will switch patient to PCSK9 therapy. Encouraged  patient to continue to maintain healthy diet.   Claiborne Billings, PharmD PGY2 Cardiology Pharmacy Resident 06/20/2018 4:13 PM

## 2018-06-20 ENCOUNTER — Ambulatory Visit (INDEPENDENT_AMBULATORY_CARE_PROVIDER_SITE_OTHER): Payer: 59 | Admitting: Pharmacist

## 2018-06-20 ENCOUNTER — Ambulatory Visit: Payer: 59

## 2018-06-20 DIAGNOSIS — E7849 Other hyperlipidemia: Secondary | ICD-10-CM

## 2018-06-20 MED ORDER — ROSUVASTATIN CALCIUM 10 MG PO TABS: 10.0000 mg | ORAL_TABLET | Freq: Every day | ORAL | 3 refills | Status: DC

## 2018-06-20 MED FILL — ROSUVASTATIN CALCIUM 10 MG: 10 | 90 days supply | Qty: 90 | Fill #0

## 2018-06-20 NOTE — Patient Instructions (Addendum)
It was nice to meet you today!  Start taking rosuvastatin 10 mg daily. We will call you in about two weeks, to follow up tolerability. At that time, we can consider adding on Zetia for further LDL lowering.   Please don't hesitate to reach out to the clinic with any questions [phone]).

## 2018-06-22 ENCOUNTER — Other Ambulatory Visit: Payer: Self-pay | Admitting: Cardiothoracic Surgery

## 2018-06-22 ENCOUNTER — Other Ambulatory Visit: Payer: Self-pay | Admitting: Physician Assistant

## 2018-06-22 DIAGNOSIS — I2510 Atherosclerotic heart disease of native coronary artery without angina pectoris: Secondary | ICD-10-CM

## 2018-06-22 DIAGNOSIS — I25119 Atherosclerotic heart disease of native coronary artery with unspecified angina pectoris: Secondary | ICD-10-CM

## 2018-06-22 DIAGNOSIS — C3492 Malignant neoplasm of unspecified part of left bronchus or lung: Secondary | ICD-10-CM

## 2018-06-22 MED FILL — SERTRALINE HCL 50 MG TABLET: 50 | 90 days supply | Qty: 90 | Fill #1

## 2018-06-22 MED FILL — LOSARTAN POTASSIUM 25 MG TA: 25 | 30 days supply | Qty: 30 | Fill #1

## 2018-06-22 MED FILL — PRASUGREL HCL 10 MG TABS: 10 | 90 days supply | Qty: 90 | Fill #1

## 2018-06-22 MED FILL — METFORMIN HCL ER 750 MG TAB: 750 | 90 days supply | Qty: 180 | Fill #0

## 2018-06-22 NOTE — Progress Notes (Signed)
[address]       [place] [address]             [phone]                  Sarah Stevenson [place] Medical Record #[id number] Date of Birth: 05-Sep-1956  Referring QM:VHQIONGE, Sarah Hatcher, NP Primary Cardiology: Primary Care:Sarah Sacramento, MD  Chief Complaint:  Follow Up Visit OPERATIVE REPORT DATE OF PROCEDURE:  05/17/2018 PREOPERATIVE DIAGNOSES:  Severe aortic stenosis and coronary occlusive disease. POSTOPERATIVE DIAGNOSES: 1.  Severe aortic stenosis with a trileaflet aortic valve and coronary occlusive disease. 2.  Aortic stenosis with a trileaflet aortic valve 3.  Coronary occlusive disease. 4.  Left upper lobe adenocarcinoma of the lung. PROCEDURES PERFORMED:   1.  Aortic valve replacement with pericardial tissue valve, [employer] model 3300 TFX 21 mm, serial number [id number]. 2.  Coronary artery bypass grafting x2 with the left internal mammary to the left anterior descending coronary artery and reverse saphenous vein graft to the distal right coronary artery with right thigh endovein harvesting.   3.  Wedge resection, left upper lobe lung lesion.  Diagnosis 1. Lung, wedge biopsy/resection, Left Upper Lobe - ADENOCARCINOMA, WELL DIFFERENTIATED, SPANNING 1.2 CM. - SEE ONCOLOGY TABLE BELOW. 2. Lung, wedge biopsy/resection, Left Upper Lobe - ADENOCARCINOMA. 3. Lung, wedge biopsy/resection, Left Upper Lobe - BENIGN LUNG PARENCHYMA. - THERE IS NO EVIDENCE OF MALIGNANCY. 4. Heart valve leaflets - NODULAR FIBROSIS AND CALCIFICATION. Microscopic Comment 1. LUNG: Procedure: Wedge resection and additional margin resections Specimen Laterality: Left upper lobe Tumor Site: Left upper lobe Tumor Size: 1.2 cm (gross measurement) Tumor Focality: Unifocal Histologic Type: Adenocarcinoma, well differentiated Visceral Pleura Invasion: Not identified. Lymphovascular Invasion: Not identified Direct Invasion of Adjacent Structures: Not  identified. Margins: Greater than 0.2 cm to parenchymal margin Treatment Effect: N/A Regional Lymph Nodes: None examined Pathologic Stage Classification (pTNM, AJCC 8th Edition): pT1a, pNX Ancillary Studies: Can be performed upon clinician request Representative Tumor Block: 1B (JBK:kh 05/19/18)  History of Present Illness:     Patient presents to the office follow-up after recent aortic valve replacement and coronary artery bypass grafting for symptomatic severe aortic stenosis and coronary artery disease.  At the time of her cardiac evaluation we obtained a CT scan of the chest to evaluate fully the size of her aorta incidentally groundglass opacity was noted in the left upper lobe.  With the patient's symptoms of unstable angina progressing over several months was decided to proceed with aortic valve replacement and coronary artery bypass grafting.  At the time of surgery we were able to palpate the lesion in the left lung while taking down the left internal mammary artery pathologic examination of this confirmed adenocarcinoma of the lung.  Due to the limitations of cardiac surgery of full nodal dissection could not be performed.  Plan to obtain PET scan now postop.  She is making reasonable progress following her coronary artery bypass grafting.  She is increasing her physical activity appropriately.  She is ready to start driving.  She does still fatigue when walking up hills.         Zubrod Score: At the time of surgery this patient's most appropriate activity status/level should be described as: []     0    Normal activity, no symptoms [x]     1    Restricted in physical strenuous activity but ambulatory, able to do out light work []   2    Ambulatory and capable of self care, unable to do work activities, up and about                 >50 % of waking hours                                                                                   []     3    Only limited self care, in bed greater  than 50% of waking hours []     4    Completely disabled, no self care, confined to bed or chair []     5    Moribund  Social History   Tobacco Use  Smoking Status Former Smoker  . Packs/day: 1.00  . Years: 10.00  . Pack years: 10.00  . Types: Cigarettes  . Last attempt to quit: 1982  . Years since quitting: 37.8  Smokeless Tobacco Never Used       Allergies  Allergen Reactions  . Brilinta [Ticagrelor] Shortness Of Breath  . Lisinopril Cough  . Asa [Aspirin] Hives and Rash  . Blue Dyes (Parenteral) Hives  . Corn-Containing Products Hives  . Hetastarch Hives and Rash  . Nsaids Hives and Rash  . Sugar-Protein-Starch Hives  . Tea Hives  . Isosulfan Blue Rash  . Other Itching and Rash    CHG soap    Current Outpatient Medications  Medication Sig Dispense Refill  . acetaminophen (TYLENOL) 500 MG tablet Take 500-1,000 mg by mouth 2 (two) times daily as needed for moderate pain.    . carvedilol (COREG) 6.25 MG tablet Take 1 tablet (6.25 mg total) by mouth 2 (two) times daily with a meal. 180 tablet 3  . cetirizine (ZYRTEC) 10 MG tablet Take 10 mg by mouth daily.    . fluticasone (FLONASE) 50 MCG/ACT nasal spray Place 1 spray into both nostrils daily as needed for allergies or rhinitis.    Marland Kitchen glipiZIDE (GLUCOTROL XL) 10 MG 24 hr tablet Take 10 mg by mouth daily with breakfast.     . Menthol-Methyl Salicylate (MUSCLE RUB EX) Apply 1 application topically as needed (for pain).    . metFORMIN (GLUCOPHAGE-XR) 750 MG 24 hr tablet Take 1,500 mg by mouth every evening.    Marland Kitchen oxyCODONE (OXY IR/ROXICODONE) 5 MG immediate release tablet Take 1 tablet (5 mg total) by mouth every 6 (six) hours as needed for severe pain. 30 tablet 0  . prasugrel (EFFIENT) 10 MG TABS tablet Take 1 tablet (10 mg total) by mouth daily. 90 tablet 2  . ranitidine (ZANTAC) 150 MG tablet Take 150 mg by mouth at bedtime.     . rosuvastatin (CRESTOR) 10 MG tablet Take 1 tablet (10 mg total) by mouth daily. 30 tablet 3    . sertraline (ZOLOFT) 50 MG tablet Take 25 mg by mouth daily.     . TRULICITY 8.18 HU/3.1SH SOPN Inject 0.75 mg into the skin once a week.   3  . zolpidem (AMBIEN) 10 MG tablet Take 10 mg by mouth at bedtime as needed for sleep.      No current facility-administered medications for this visit.  Physical Exam: BP 136/76   Pulse 82   Resp 20   Ht 5' 2.5" (1.588 m)   Wt 197 lb (89.4 kg)   SpO2 97% Comment: RA  BMI 35.46 kg/m   General appearance: alert, cooperative, appears stated age and no distress Neurologic: intact Heart: regular rate and rhythm, S1, S2 normal, no murmur, click, rub or gallop Lungs: clear to auscultation bilaterally Abdomen: soft, non-tender; bowel sounds normal; no masses,  no organomegaly Extremities: extremities normal, atraumatic, no cyanosis or edema and Homans sign is negative, no sign of DVT Wound: Sternum is stable and well-healed   Diagnostic Studies & Laboratory data:         Recent Radiology Findings: Dg Chest 2 View  Result Date: 06/23/2018 CLINICAL DATA:  Left wedge resection May 17, 2018. Shortness of breath. EXAM: CHEST - 2 VIEW COMPARISON:  May 21, 2018 FINDINGS: The cardiomediastinal silhouette is stable. Persistent postoperative changes in the left apex are mildly improved. No pneumothorax. Lingular opacity is more prominent the interval. No other acute abnormalities. IMPRESSION: 1. Increasing lingular opacity could represent scar, atelectasis, or infiltrate. Recommend clinical correlation and follow-up to resolution. 2. Postoperative changes in the left apex are mildly improved. Electronically Signed   By: Dorise Bullion III M.D   On: 06/23/2018 14:23    I have independently reviewed the above radiology findings and reviewed findings  with the patient.  Recent Labs: Lab Results  Component Value Date   WBC 6.7 06/08/2018   HGB 10.0 (L) 06/08/2018   HCT 30.3 (L) 06/08/2018   PLT 211 06/08/2018   GLUCOSE 153 (H)  06/08/2018   CHOL 153 05/11/2017   TRIG 281 (H) 05/11/2017   HDL 29 (L) 05/11/2017   LDLCALC 68 05/11/2017   ALT 19 05/12/2018   AST 17 05/12/2018   NA 139 06/08/2018   K 4.9 06/08/2018   CL 102 06/08/2018   CREATININE 1.05 (H) 06/08/2018   BUN 17 06/08/2018   CO2 21 06/08/2018   INR 1.53 05/17/2018   HGBA1C 9.3 (H) 05/12/2018      Assessment / Plan:   #1 status post coronary artery bypass grafting and aortic valve replacement-making reasonable progress postoperatively, to start cardiac rehab when a spot is available #2 non-small cell carcinoma of the lung, well differentiated 1.2 cm, wedge resection of left upper leg at the time of her aortic valve replacement and coronary artery bypass grafting, not fully staged what appears to be clinical stage I disease  Up again reviewed with patient her continued postop care for her coronary artery bypass grafting and aortic valve replacement she is aware of need for dental prophylaxis We discussed proceeding with PET scan and oncology appointment in the near future.  Plan to see her back in several weeks with follow-up.     Grace Isaac 06/26/2018 4:45 PM

## 2018-06-23 ENCOUNTER — Ambulatory Visit (INDEPENDENT_AMBULATORY_CARE_PROVIDER_SITE_OTHER): Payer: 59 | Admitting: Cardiothoracic Surgery

## 2018-06-23 ENCOUNTER — Ambulatory Visit: Payer: 59 | Admitting: Cardiothoracic Surgery

## 2018-06-23 ENCOUNTER — Ambulatory Visit
Admission: RE | Admit: 2018-06-23 | Discharge: 2018-06-23 | Disposition: A | Discharge status: Home / Self Care | Payer: 59 | Source: Ambulatory Visit | Attending: Cardiothoracic Surgery | Admitting: Cardiothoracic Surgery

## 2018-06-23 ENCOUNTER — Other Ambulatory Visit: Payer: Self-pay | Admitting: *Deleted

## 2018-06-23 VITALS — BP 136/76 | HR 82 | Resp 20 | Ht 62.5 in | Wt 197.0 lb

## 2018-06-23 DIAGNOSIS — I25119 Atherosclerotic heart disease of native coronary artery with unspecified angina pectoris: Secondary | ICD-10-CM

## 2018-06-23 DIAGNOSIS — R911 Solitary pulmonary nodule: Secondary | ICD-10-CM

## 2018-06-23 DIAGNOSIS — R918 Other nonspecific abnormal finding of lung field: Secondary | ICD-10-CM | POA: Diagnosis not present

## 2018-06-23 DIAGNOSIS — Z952 Presence of prosthetic heart valve: Secondary | ICD-10-CM

## 2018-06-23 DIAGNOSIS — Z951 Presence of aortocoronary bypass graft: Secondary | ICD-10-CM

## 2018-06-24 ENCOUNTER — Other Ambulatory Visit: Payer: Self-pay | Admitting: Interventional Cardiology

## 2018-06-24 MED ORDER — CARVEDILOL 6.25 MG PO TABS: 6.2500 mg | ORAL_TABLET | Freq: Two times a day (BID) | ORAL | 3 refills | Status: DC

## 2018-06-24 MED FILL — CARVEDILOL 6.25 MG TABLET: 6.25 | 90 days supply | Qty: 180 | Fill #0

## 2018-06-26 ENCOUNTER — Encounter: Payer: Self-pay | Admitting: Cardiothoracic Surgery

## 2018-06-28 ENCOUNTER — Encounter: Payer: Self-pay | Admitting: Internal Medicine

## 2018-06-28 ENCOUNTER — Telehealth: Payer: Self-pay | Admitting: Internal Medicine

## 2018-06-28 NOTE — Telephone Encounter (Signed)
New referral received from Dr. Servando Snare for lung cancer. Pt has been scheduled to see Dr. Julien Nordmann on 11/5 at 1130am and labs at 11am. I left the pt a voicemail with the appt info. Letter mailed.

## 2018-06-28 NOTE — Telephone Encounter (Signed)
Pt returned my call and confirmed appt with Dr. Julien Nordmann on  11/5

## 2018-06-29 ENCOUNTER — Ambulatory Visit (HOSPITAL_COMMUNITY)
Admission: RE | Admit: 2018-06-29 | Discharge: 2018-06-29 | Disposition: A | Discharge status: Home / Self Care | Payer: 59 | Source: Ambulatory Visit | Attending: Cardiothoracic Surgery | Admitting: Cardiothoracic Surgery

## 2018-06-29 DIAGNOSIS — Z951 Presence of aortocoronary bypass graft: Secondary | ICD-10-CM | POA: Diagnosis not present

## 2018-06-29 DIAGNOSIS — R911 Solitary pulmonary nodule: Secondary | ICD-10-CM | POA: Diagnosis not present

## 2018-06-29 LAB — GLUCOSE, CAPILLARY: Glucose-Capillary: 128 mg/dL — ABNORMAL HIGH (ref 70–99)

## 2018-06-29 MED ORDER — FLUDEOXYGLUCOSE F - 18 (FDG) INJECTION: 9.6300 | Freq: Once | INTRAVENOUS | Status: DC | PRN

## 2018-06-29 MED ORDER — FLUDEOXYGLUCOSE F - 18 (FDG) INJECTION
9.6300 | Freq: Once | INTRAVENOUS | Status: AC | PRN
  Administered 2018-06-29: 9.63 via INTRAVENOUS

## 2018-07-03 DIAGNOSIS — Z952 Presence of prosthetic heart valve: Secondary | ICD-10-CM | POA: Insufficient documentation

## 2018-07-03 NOTE — Progress Notes (Signed)
Cardiology Office Note:    Date:  07/04/2018   ID:  Sarah Stevenson, DOB [birth year], MRN [id number]  PCP:  Christain Sacramento, MD  Cardiologist:  No primary care provider on file.   Referring MD: Christain Sacramento, MD   Chief Complaint  Patient presents with  . Coronary Artery Disease  . Lung Cancer    Adenocarcinoma    History of Present Illness:    Sarah Stevenson is a 62 y.o. female with a hx of  CAD with prior RCA DES and severe diagonal #2 disease, and aortic stenosis treated with CABG and AVR. Found to have lung cancer at time of heart surgery.  Sarah Stevenson is doing relatively well.  She is status post coronary bypass grafting x2, resection of adenocarcinoma of the right upper lobe, and aortic valve replacement.  Subsequent PET scan demonstrates no evidence of metastatic disease.  She is exercising but feels more short of breath than she did prior to surgery.  We discussed that this likely represented deconditioning after a major surgical procedure and postoperative course.  She is gradually improving.  She seems to develop musculoskeletal syndrome on statin therapy.  High intensity statin therapy was discontinued because of severe hip and musculoskeletal complaints.  More recently she was started on 10 mg of rosuvastatin per day and developed hives.  She stopped the medication more than a week ago.  She was to have blood work done today but since not on therapy, this will be delayed.  She has background history of recurrent hives therefore it is uncertain if statins have anything to do with the recent occurrence.  Denies palpitations.  She has not had syncope.  She is back driving.   Past Medical History:  Diagnosis Date  . Anginal pain ([facility])   . Aortic stenosis   . Coronary artery disease   . Depression    "mild" (08/06/2016)  . Dyspnea   . Hyperlipidemia   . Murmur, cardiac   . Myocardial infarction ([facility]) 2016  . Osteoarthritis    "left hip and knee" (08/06/2016)  . Type  II diabetes mellitus ([facility])     Past Surgical History:  Procedure Laterality Date  . AORTIC VALVE REPLACEMENT N/A 05/17/2018   Procedure: AORTIC VALVE REPLACEMENT (AVR) USING 21 MM MAGNA EASE PERICARDIAL BIOPROSTHESIS. MODEL # [id number]. SERIAL # [id number];  Surgeon: Grace Isaac, MD;  Location: [facility];  Service: Open Heart Surgery;  Laterality: N/A;  . CARDIAC CATHETERIZATION Right 11/30/2014   Procedure: RIGHT/LEFT HEART CATH AND CORONARY ANGIOGRAPHY;  Surgeon: Belva Crome, MD;  Location: [facility];  Service: Cardiovascular;  Laterality: Right;  . CARDIAC CATHETERIZATION N/A 12/13/2015   Procedure: Left Heart Cath and Coronary Angiography;  Surgeon: Leonie Man, MD;  Location: [facility] CV LAB;  Service: Cardiovascular;  Laterality: N/A;  . CARDIAC CATHETERIZATION N/A 12/13/2015   Procedure: Intravascular Pressure Wire/FFR Study;  Surgeon: Leonie Man, MD;  Location: [facility] CV LAB;  Service: Cardiovascular;  Laterality: N/A;  RCA  . CARDIAC CATHETERIZATION N/A 08/06/2016   Procedure: Right/Left Heart Cath and Coronary Angiography;  Surgeon: Lorretta Harp, MD;  Location: [facility] CV LAB;  Service: Cardiovascular;  Laterality: N/A;  . CARDIAC CATHETERIZATION N/A 08/06/2016   Procedure: Coronary Stent Intervention;  Surgeon: Lorretta Harp, MD;  Location: [facility] CV LAB;  Service: Cardiovascular;  Laterality: N/A;   distal rca 3.0x16 and 3.0x8 synergy  . CESAREAN SECTION  1989  . CORONARY ANGIOPLASTY  WITH STENT PLACEMENT  08/06/2016   "2 stents"  . CORONARY ARTERY BYPASS GRAFT N/A 05/17/2018   Procedure: CORONARY ARTERY BYPASS GRAFTING (CABG) X 2 WITH ENDOSCOPIC HARVESTING OF RIGHT SAPHENOUS VEIN. LIMA TO LAD. SVG TO RCA;  Surgeon: Grace Isaac, MD;  Location: [facility];  Service: Open Heart Surgery;  Laterality: N/A;  . HYSTEROSCOPY DIAGNOSTIC  1990s  . JOINT REPLACEMENT    . KNEE ARTHROSCOPY Left 1996  . NASAL SINUS SURGERY  1977  . RIGHT/LEFT HEART CATH AND  CORONARY ANGIOGRAPHY N/A 04/01/2018   Procedure: RIGHT/LEFT HEART CATH AND CORONARY ANGIOGRAPHY;  Surgeon: Belva Crome, MD;  Location: [facility] CV LAB;  Service: Cardiovascular;  Laterality: N/A;  . TEE WITHOUT CARDIOVERSION N/A 05/17/2018   Procedure: TRANSESOPHAGEAL ECHOCARDIOGRAM (TEE);  Surgeon: Grace Isaac, MD;  Location: [facility];  Service: Open Heart Surgery;  Laterality: N/A;  . TOTAL HIP ARTHROPLASTY Left 04/23/2017   Procedure: LEFT TOTAL HIP ARTHROPLASTY ANTERIOR APPROACH;  Surgeon: Mcarthur Rossetti, MD;  Location: [facility];  Service: Orthopedics;  Laterality: Left;  . WEDGE RESECTION Left 05/17/2018   Procedure: LEFT UPPER LOBE LUNG WEDGE RESECTION;  Surgeon: Grace Isaac, MD;  Location: [facility];  Service: Open Heart Surgery;  Laterality: Left;    Current Medications: Current Meds  Medication Sig  . acetaminophen (TYLENOL) 500 MG tablet Take 500-1,000 mg by mouth 2 (two) times daily as needed for moderate pain.  . carvedilol (COREG) 6.25 MG tablet Take 1 tablet (6.25 mg total) by mouth 2 (two) times daily with a meal.  . cetirizine (ZYRTEC) 10 MG tablet Take 10 mg by mouth daily.  . famotidine (PEPCID) 20 MG tablet Take 20 mg by mouth at bedtime.  . fluticasone (FLONASE) 50 MCG/ACT nasal spray Place 1 spray into both nostrils daily as needed for allergies or rhinitis.  Marland Kitchen glipiZIDE (GLUCOTROL XL) 10 MG 24 hr tablet Take 10 mg by mouth daily with breakfast.   . Menthol-Methyl Salicylate (MUSCLE RUB EX) Apply 1 application topically as needed (for pain).  . metFORMIN (GLUCOPHAGE-XR) 750 MG 24 hr tablet Take 1,500 mg by mouth every evening.  . prasugrel (EFFIENT) 10 MG TABS tablet Take 1 tablet (10 mg total) by mouth daily.  . sertraline (ZOLOFT) 50 MG tablet Take 25 mg by mouth daily.   . TRULICITY 8.11 BJ/4.7WG SOPN Inject 0.75 mg into the skin once a week.      Allergies:   Brilinta [ticagrelor]; Lisinopril; Asa [aspirin]; Blue dyes (parenteral); Corn-containing  products; Hetastarch; Nsaids; Sugar-protein-starch; Tea; Isosulfan blue; and Other   Social History   Socioeconomic History  . Marital status: Divorced    Spouse name: Not on file  . Number of children: Not on file  . Years of education: Not on file  . Highest education level: Not on file  Occupational History  . Not on file  Social Needs  . Financial resource strain: Not on file  . Food insecurity:    Worry: Not on file    Inability: Not on file  . Transportation needs:    Medical: Not on file    Non-medical: Not on file  Tobacco Use  . Smoking status: Former Smoker    Packs/day: 1.00    Years: 10.00    Pack years: 10.00    Types: Cigarettes    Last attempt to quit: 1982    Years since quitting: 37.8  . Smokeless tobacco: Never Used  Substance and Sexual Activity  . Alcohol  use: Yes    Alcohol/week: 7.0 standard drinks    Types: 7 Shots of liquor per week    Comment: weekly  . Drug use: No  . Sexual activity: Never  Lifestyle  . Physical activity:    Days per week: Not on file    Minutes per session: Not on file  . Stress: Not on file  Relationships  . Social connections:    Talks on phone: Not on file    Gets together: Not on file    Attends religious service: Not on file    Active member of club or organization: Not on file    Attends meetings of clubs or organizations: Not on file    Relationship status: Not on file  Other Topics Concern  . Not on file  Social History Narrative  . Not on file     Family History: The patient's family history includes Heart attack in her father and mother; Sudden death in her father. There is no history of Hypertension, Hyperlipidemia, or Diabetes.  ROS:   Please see the history of present illness.    Musculoskeletal aches and pains on statin therapy.  Dyspnea on exertion.  She is driving a car without difficulty.  All other systems reviewed and are negative.  EKGs/Labs/Other Studies Reviewed:    The following studies  were reviewed today:  Nuclear medicine PET scan 06/29/2018:   IMPRESSION: 1. Postsurgical change in the LEFT upper lobe without clear evidence of carcinoma. Recommend follow-up CT surveillance. 2. No evidence of metastatic disease. 3. Interval sternotomy and CABG   EKG:  EKG is not ordered today.    Recent Labs: 05/12/2018: ALT 19 05/18/2018: Magnesium 2.3 06/08/2018: BUN 17; Creatinine, Ser 1.05; Hemoglobin 10.0; Platelets 211; Potassium 4.9; Sodium 139  Recent Lipid Panel    Component Value Date/Time   CHOL 153 05/11/2017 0815   TRIG 281 (H) 05/11/2017 0815   HDL 29 (L) 05/11/2017 0815   CHOLHDL 5.3 (H) 05/11/2017 0815   CHOLHDL 4.8 12/01/2014 0425   VLDL 24 12/01/2014 0425   LDLCALC 68 05/11/2017 0815    Physical Exam:    VS:  BP 108/70   Pulse 81   Ht 5' 2.5" (1.588 m)   Wt 197 lb 3.2 oz (89.4 kg)   BMI 35.49 kg/m     Wt Readings from Last 3 Encounters:  07/04/18 197 lb 3.2 oz (89.4 kg)  06/23/18 197 lb (89.4 kg)  06/08/18 197 lb 1.9 oz (89.4 kg)     GEN:  Well nourished, well developed in no acute distress HEENT: Normal NECK: No JVD. LYMPHATICS: No lymphadenopathy CARDIAC: RRR, no murmur, no gallop, no edema. VASCULAR: 2+ bilateral radial and carotid pulses.  No bruits. RESPIRATORY:  Clear to auscultation without rales, wheezing or rhonchi  ABDOMEN: Soft, non-tender, non-distended, No pulsatile mass, MUSCULOSKELETAL: No deformity  SKIN: Warm and dry NEUROLOGIC:  Alert and oriented x 3 PSYCHIATRIC:  Normal affect   ASSESSMENT:    1. Coronary artery disease involving coronary bypass graft of native heart with angina pectoris ([facility])   2. H/O aortic valve replacement   3. Mixed hyperlipidemia   4. Type 2 diabetes mellitus with complication ([facility])   5. Adenocarcinoma of lung, left ([facility])    PLAN:    In order of problems listed above:  1. She has had no angina since surgical coronary revascularization.  Secondary risk mitigation discussed in detail: A1c  less than 7, 150 minutes of moderate aerobic activity per week  or greater, blood pressure 130/80 mmHg or less, and LDL cholesterol less than 70. 2. Status post aortic valve replacement with bioprosthetic device, 21 mm Magna Ease Pericardial Bioprosthesis.  No clinical evidence of regurgitation. 3. She stopped taking 10 mg rosuvastatin 1 week ago.  She felt an episode of hives may have been related to therapy.  She has a pre-existing history of hives prior to ever being started on statin therapy.  On high intensity statin therapy she has developed musculoskeletal aches and pains.  The pharmacy/lipid clinic was attempting to resume low-dose statin without provoking musculoskeletal symptoms when she interrupted therapy because of the hives.  I have recommended resuming therapy at 10 mg/day and having a liver and lipid panel done in 1 month when she sees her primary care.  This information should then be given to the lipid clinic and a decision will be made about whether to add ezetimibe or if we should switch to a PCSK9. 4. She really needs to work hard to get A1c less than 7.  Most recent within the past month was 9.6.  I strongly encouraged greater than or equal to 150 minutes of moderate aerobic activity per week. 5. Adenocarcinoma of the left lung resected at the time of open heart surgery.  Clean margins were noted.  PET scan is negative.  She will follow-up with Dr. Lorna Few next week for further direction and management.  She needs cardiovascular follow-up in 4 to 6 months.  I have encouraged her to concentrate on secondary risk reduction throughout the remainder of her life.  We talked about targets as listed above.  She should call if any clinical problems.  Greater than 50% of the time during this office visit was spent in education, counseling, and coordination of care related to underlying disease process and testing as outlined.   Medication Adjustments/Labs and Tests Ordered: Current  medicines are reviewed at length with the patient today.  Concerns regarding medicines are outlined above.  No orders of the defined types were placed in this encounter.  No orders of the defined types were placed in this encounter.   There are no Patient Instructions on file for this visit.   Signed, Sinclair Grooms, MD  07/04/2018 12:39 PM    [place]

## 2018-07-04 ENCOUNTER — Encounter: Payer: Self-pay | Admitting: Interventional Cardiology

## 2018-07-04 ENCOUNTER — Ambulatory Visit (INDEPENDENT_AMBULATORY_CARE_PROVIDER_SITE_OTHER): Payer: 59 | Admitting: Interventional Cardiology

## 2018-07-04 VITALS — BP 108/70 | HR 81 | Ht 62.5 in | Wt 197.2 lb

## 2018-07-04 DIAGNOSIS — E118 Type 2 diabetes mellitus with unspecified complications: Secondary | ICD-10-CM | POA: Diagnosis not present

## 2018-07-04 DIAGNOSIS — C3492 Malignant neoplasm of unspecified part of left bronchus or lung: Secondary | ICD-10-CM | POA: Diagnosis not present

## 2018-07-04 DIAGNOSIS — I25709 Atherosclerosis of coronary artery bypass graft(s), unspecified, with unspecified angina pectoris: Secondary | ICD-10-CM

## 2018-07-04 DIAGNOSIS — E782 Mixed hyperlipidemia: Secondary | ICD-10-CM

## 2018-07-04 DIAGNOSIS — Z952 Presence of prosthetic heart valve: Secondary | ICD-10-CM | POA: Diagnosis not present

## 2018-07-04 MED ORDER — ROSUVASTATIN CALCIUM 10 MG PO TABS: 10.0000 mg | ORAL_TABLET | Freq: Every day | ORAL | 3 refills | Status: DC

## 2018-07-04 NOTE — Patient Instructions (Addendum)
Medication Instructions:  Your physician has recommended you make the following change in your medication: Resume Rosuvastatin 10 mg by mouth daily.   If you need a refill on your cardiac medications before your next appointment, please call your pharmacy.   Lab work: Have lipid and liver profiles checked at primary care in about a month If you have labs (blood work) drawn today and your tests are completely normal, you will receive your results only by: Marland Kitchen MyChart Message (if you have MyChart) OR . A paper copy in the mail If you have any lab test that is abnormal or we need to change your treatment, we will call you to review the results.  Testing/Procedures: none  Follow-Up: Your physician recommends that you schedule a follow-up appointment in: 4-6 months with Dr. Tamala Julian   Any Other Special Instructions Will Be Listed Below (If Applicable).

## 2018-07-04 NOTE — Progress Notes (Signed)
Sarah Stevenson 62 y.o. female DOB Nov 16, 1955 MRN [id number]       Nutrition  No diagnosis found. Past Medical History:  Diagnosis Date  . Anginal pain ([place])   . Aortic stenosis   . Coronary artery disease   . Depression    "mild" (08/06/2016)  . Dyspnea   . Hyperlipidemia   . Murmur, cardiac   . Myocardial infarction ([place]) 2016  . Osteoarthritis    "left hip and knee" (08/06/2016)  . Type II diabetes mellitus ([place])    Meds reviewed.    Current Outpatient Medications (Endocrine & Metabolic):  .  glipiZIDE (GLUCOTROL XL) 10 MG 24 hr tablet, Take 10 mg by mouth daily with breakfast.  .  metFORMIN (GLUCOPHAGE-XR) 750 MG 24 hr tablet, Take 1,500 mg by mouth every evening. .  TRULICITY 0.16 WF/0.9NA SOPN, Inject 0.75 mg into the skin once a week.   Current Outpatient Medications (Cardiovascular):  .  carvedilol (COREG) 6.25 MG tablet, Take 1 tablet (6.25 mg total) by mouth 2 (two) times daily with a meal. .  losartan (COZAAR) 25 MG tablet, Take 25 mg by mouth daily. .  rosuvastatin (CRESTOR) 10 MG tablet, Take 1 tablet (10 mg total) by mouth daily.  Current Outpatient Medications (Respiratory):  .  cetirizine (ZYRTEC) 10 MG tablet, Take 10 mg by mouth daily. .  fluticasone (FLONASE) 50 MCG/ACT nasal spray, Place 1 spray into both nostrils daily as needed for allergies or rhinitis.  Current Outpatient Medications (Analgesics):  .  acetaminophen (TYLENOL) 500 MG tablet, Take 500-1,000 mg by mouth 2 (two) times daily as needed for moderate pain.  Current Outpatient Medications (Hematological):  .  prasugrel (EFFIENT) 10 MG TABS tablet, Take 1 tablet (10 mg total) by mouth daily.  Current Outpatient Medications (Other):  .  famotidine (PEPCID) 20 MG tablet, Take 20 mg by mouth at bedtime. .  Menthol-Methyl Salicylate (MUSCLE RUB EX), Apply 1 application topically as needed (for pain). Marland Kitchen  sertraline (ZOLOFT) 50 MG tablet, Take 25 mg by mouth daily.    HT: Ht Readings from  Last 1 Encounters:  07/04/18 5' 2.5" (1.588 m)    WT: Wt Readings from Last 5 Encounters:  07/04/18 197 lb 3.2 oz (89.4 kg)  06/23/18 197 lb (89.4 kg)  06/08/18 197 lb 1.9 oz (89.4 kg)  05/24/18 203 lb 4.2 oz (92.2 kg)  05/12/18 211 lb 9.6 oz (96 kg)     There is no height or weight on file to calculate BMI.   Current tobacco use? No       Labs:  Lipid Panel     Component Value Date/Time   CHOL 153 05/11/2017 0815   TRIG 281 (H) 05/11/2017 0815   HDL 29 (L) 05/11/2017 0815   CHOLHDL 5.3 (H) 05/11/2017 0815   CHOLHDL 4.8 12/01/2014 0425   VLDL 24 12/01/2014 0425   LDLCALC 68 05/11/2017 0815    Lab Results  Component Value Date   HGBA1C 9.3 (H) 05/12/2018   CBG (last 3)  No results for input(s): GLUCAP in the last 72 hours.  Nutrition Diagnosis ? Food-and nutrition-related knowledge deficit related to lack of exposure to information as related to diagnosis of: ? CVD ? Type 2 Diabetes  Nutrition Goal(s):  ? To be determined  Plan:  Pt to attend nutrition classes ? Nutrition I ? Nutrition II ? Portion Distortion  ? Diabetes Blitz ? Diabetes Q & A Will provide client-centered nutrition education as part of interdisciplinary care.  Monitor and evaluate progress toward nutrition goal with team.  Sarah Bustle, MS, RD, LDN 07/04/2018 3:09 PM

## 2018-07-04 NOTE — Progress Notes (Signed)
Cardiac Rehab Medication Review   Does the patient  feel that his/her medications are working for him/her?  YES   Has the patient been experiencing any side effects to the medications prescribed?   NO however retrying Crestor 10 mg daily had history of rash with higher doses  Does the patient measure his/her own blood pressure or blood glucose at home?  YES monitors both bp and blood glucose at home routinely  Does the patient have any problems obtaining medications due to transportation or finances?   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    Sarah Stevenson Im 07/04/2018 2:50 PM

## 2018-07-05 ENCOUNTER — Encounter (HOSPITAL_COMMUNITY)
Admission: RE | Admit: 2018-07-05 | Discharge: 2018-07-05 | Disposition: A | Discharge status: Home / Self Care | Payer: 59 | Source: Ambulatory Visit | Attending: Interventional Cardiology | Admitting: Interventional Cardiology

## 2018-07-05 ENCOUNTER — Encounter (HOSPITAL_COMMUNITY): Payer: Self-pay

## 2018-07-05 ENCOUNTER — Telehealth: Payer: Self-pay | Admitting: Cardiothoracic Surgery

## 2018-07-05 VITALS — BP 118/68 | HR 78 | Ht 62.0 in | Wt 198.6 lb

## 2018-07-05 DIAGNOSIS — Z87891 Personal history of nicotine dependence: Secondary | ICD-10-CM | POA: Insufficient documentation

## 2018-07-05 DIAGNOSIS — E785 Hyperlipidemia, unspecified: Secondary | ICD-10-CM | POA: Insufficient documentation

## 2018-07-05 DIAGNOSIS — F329 Major depressive disorder, single episode, unspecified: Secondary | ICD-10-CM | POA: Insufficient documentation

## 2018-07-05 DIAGNOSIS — Z952 Presence of prosthetic heart valve: Secondary | ICD-10-CM | POA: Insufficient documentation

## 2018-07-05 DIAGNOSIS — Z79899 Other long term (current) drug therapy: Secondary | ICD-10-CM | POA: Diagnosis not present

## 2018-07-05 DIAGNOSIS — Z96642 Presence of left artificial hip joint: Secondary | ICD-10-CM | POA: Diagnosis not present

## 2018-07-05 DIAGNOSIS — M1612 Unilateral primary osteoarthritis, left hip: Secondary | ICD-10-CM | POA: Insufficient documentation

## 2018-07-05 DIAGNOSIS — E119 Type 2 diabetes mellitus without complications: Secondary | ICD-10-CM | POA: Diagnosis not present

## 2018-07-05 DIAGNOSIS — Z7984 Long term (current) use of oral hypoglycemic drugs: Secondary | ICD-10-CM | POA: Insufficient documentation

## 2018-07-05 DIAGNOSIS — M1712 Unilateral primary osteoarthritis, left knee: Secondary | ICD-10-CM | POA: Insufficient documentation

## 2018-07-05 DIAGNOSIS — I2510 Atherosclerotic heart disease of native coronary artery without angina pectoris: Secondary | ICD-10-CM | POA: Insufficient documentation

## 2018-07-05 DIAGNOSIS — I252 Old myocardial infarction: Secondary | ICD-10-CM | POA: Insufficient documentation

## 2018-07-05 DIAGNOSIS — Z902 Acquired absence of lung [part of]: Secondary | ICD-10-CM | POA: Diagnosis not present

## 2018-07-05 DIAGNOSIS — Z951 Presence of aortocoronary bypass graft: Secondary | ICD-10-CM | POA: Insufficient documentation

## 2018-07-05 NOTE — Progress Notes (Signed)
Sarah Stevenson 62 y.o. female DOB: 22-Jan-1956 MRN: [id number]      Nutrition Note  1. 05/17/18 CABG x2   2. 05/17/18 AVR (tissue)    Past Medical History:  Diagnosis Date  . Anginal pain ([place])   . Aortic stenosis   . Coronary artery disease   . Depression    "mild" (08/06/2016)  . Dyspnea   . Hyperlipidemia   . Murmur, cardiac   . Myocardial infarction ([place]) 2016  . Osteoarthritis    "left hip and knee" (08/06/2016)  . Type II diabetes mellitus ([place])    Meds reviewed.   Current Outpatient Medications (Endocrine & Metabolic):  .  glipiZIDE (GLUCOTROL XL) 10 MG 24 hr tablet, Take 10 mg by mouth daily with breakfast.  .  metFORMIN (GLUCOPHAGE-XR) 750 MG 24 hr tablet, Take 1,500 mg by mouth every evening. .  TRULICITY 6.38 LH/7.3SK SOPN, Inject 0.75 mg into the skin once a week.   Current Outpatient Medications (Cardiovascular):  .  carvedilol (COREG) 6.25 MG tablet, Take 1 tablet (6.25 mg total) by mouth 2 (two) times daily with a meal. .  losartan (COZAAR) 25 MG tablet, Take 25 mg by mouth daily. .  rosuvastatin (CRESTOR) 10 MG tablet, Take 1 tablet (10 mg total) by mouth daily.  Current Outpatient Medications (Respiratory):  .  cetirizine (ZYRTEC) 10 MG tablet, Take 10 mg by mouth daily. .  fluticasone (FLONASE) 50 MCG/ACT nasal spray, Place 1 spray into both nostrils daily as needed for allergies or rhinitis.  Current Outpatient Medications (Analgesics):  .  acetaminophen (TYLENOL) 500 MG tablet, Take 500-1,000 mg by mouth 2 (two) times daily as needed for moderate pain.  Current Outpatient Medications (Hematological):  .  prasugrel (EFFIENT) 10 MG TABS tablet, Take 1 tablet (10 mg total) by mouth daily.  Current Outpatient Medications (Other):  .  famotidine (PEPCID) 20 MG tablet, Take 20 mg by mouth at bedtime. .  Menthol-Methyl Salicylate (MUSCLE RUB EX), Apply 1 application topically as needed (for pain). Marland Kitchen  sertraline (ZOLOFT) 50 MG tablet, Take 25 mg by mouth  daily.    HT: Ht Readings from Last 1 Encounters:  07/05/18 5\' 2"  (1.575 m)    WT: Wt Readings from Last 5 Encounters:  07/05/18 198 lb 10.2 oz (90.1 kg)  07/04/18 197 lb 3.2 oz (89.4 kg)  06/23/18 197 lb (89.4 kg)  06/08/18 197 lb 1.9 oz (89.4 kg)  05/24/18 203 lb 4.2 oz (92.2 kg)     Body mass index is 36.33 kg/m.   Current tobacco use? No  Labs:  Lipid Panel     Component Value Date/Time   CHOL 153 05/11/2017 0815   TRIG 281 (H) 05/11/2017 0815   HDL 29 (L) 05/11/2017 0815   CHOLHDL 5.3 (H) 05/11/2017 0815   CHOLHDL 4.8 12/01/2014 0425   VLDL 24 12/01/2014 0425   LDLCALC 68 05/11/2017 0815    Lab Results  Component Value Date   HGBA1C 9.3 (H) 05/12/2018   CBG (last 3)  No results for input(s): GLUCAP in the last 72 hours.  Nutrition Note Spoke with pt. Nutrition plan and goals reviewed with pt. Pt is following Step 1 of the Therapeutic Lifestyle Changes diet. Pt wants to lose wt. Pt has not actively been trying to lose weight. Wt loss tips and heart healthy eating tips reviewed (label reading, how to build a healthy plate, portion sizes, eating frequently across the day). Pt has Type 2 Diabetes. Pt shared she does not  watch the amounts of carbs consumed or count carbohydrates across the day. Discussed the importance of carb counting and how this will allow her to get a consistent  amount of carbs across the day, which can help to manage her blood glucose levels. Per discussion, pt does not use canned/convenience foods often. Pt does not add salt to food. Pt does not eat out frequently. Pt expressed understanding of the information reviewed. Pt aware of nutrition education classes offered and would like to attend nutrition classes.  Nutrition Diagnosis ? Food-and nutrition-related knowledge deficit related to lack of exposure to information as related to diagnosis of: ? CVD ? Type 2 Diabetes ? Obese  II = 35-39.9 related to excessive energy intake as evidenced by a Body  mass index is 36.33 kg/m.  Nutrition Intervention ? Pt's individual nutrition plan and goals reviewed with pt.  Nutrition Goal(s):   ? Pt to identify and limit food sources of saturated fat, trans fat, refined carbohydrates and sodium ? Pt to identify food quantities necessary to achieve weight loss of 6-24 lbs. at graduation from cardiac rehab. Pt shared ultimate goal weight of 160-165 lbs  ? Improved blood glucose control as evidenced by pt's A1c trending from 9.3 toward less than 7.0. ? Pt able to name foods that affect blood glucose.  Plan:  ? Pt to attend nutrition classes ? Nutrition I ? Nutrition II ? Portion Distortion  ? Diabetes Blitz ? Diabetes Q & Ae determined ? Will provide client-centered nutrition education as part of interdisciplinary care ? Monitor and evaluate progress toward nutrition goal with team.   Laurina Bustle, MS, RD, LDN 07/05/2018 1:48 PM

## 2018-07-05 NOTE — Progress Notes (Signed)
Cardiac Individual Treatment Plan  Patient Details  Name: DORNA MALLET MRN: [id number] Date of Birth: 1955-09-20 Referring Provider:     CARDIAC REHAB PHASE II ORIENTATION from 07/05/2018 in [place]  Referring Provider  Dr. Daneen Schick      Initial Encounter Date:    CARDIAC REHAB PHASE II ORIENTATION from 07/05/2018 in [place]  Date  07/05/18      Visit Diagnosis: 05/17/18 CABG x2  05/17/18 AVR (tissue)  Patient's Home Medications on Admission:  Current Outpatient Medications:  .  carvedilol (COREG) 6.25 MG tablet, Take 1 tablet (6.25 mg total) by mouth 2 (two) times daily with a meal., Disp: 180 tablet, Rfl: 3 .  cetirizine (ZYRTEC) 10 MG tablet, Take 10 mg by mouth daily., Disp: , Rfl:  .  famotidine (PEPCID) 20 MG tablet, Take 20 mg by mouth at bedtime., Disp: , Rfl:  .  glipiZIDE (GLUCOTROL XL) 10 MG 24 hr tablet, Take 10 mg by mouth daily with breakfast. , Disp: , Rfl:  .  losartan (COZAAR) 25 MG tablet, Take 25 mg by mouth daily., Disp: , Rfl:  .  Menthol-Methyl Salicylate (MUSCLE RUB EX), Apply 1 application topically as needed (for pain)., Disp: , Rfl:  .  metFORMIN (GLUCOPHAGE-XR) 750 MG 24 hr tablet, Take 1,500 mg by mouth every evening., Disp: , Rfl:  .  prasugrel (EFFIENT) 10 MG TABS tablet, Take 1 tablet (10 mg total) by mouth daily., Disp: 90 tablet, Rfl: 2 .  rosuvastatin (CRESTOR) 10 MG tablet, Take 1 tablet (10 mg total) by mouth daily., Disp: 90 tablet, Rfl: 3 .  sertraline (ZOLOFT) 50 MG tablet, Take 25 mg by mouth daily. , Disp: , Rfl:  .  TRULICITY 8.58 IF/0.2DX SOPN, Inject 0.75 mg into the skin once a week. , Disp: , Rfl: 3 .  acetaminophen (TYLENOL) 500 MG tablet, Take 500-1,000 mg by mouth 2 (two) times daily as needed for moderate pain., Disp: , Rfl:  .  fluticasone (FLONASE) 50 MCG/ACT nasal spray, Place 1 spray into both nostrils daily as needed for allergies or rhinitis., Disp: ,  Rfl:   Past Medical History: Past Medical History:  Diagnosis Date  . Anginal pain ([place])   . Aortic stenosis   . Coronary artery disease   . Depression    "mild" (08/06/2016)  . Dyspnea   . Hyperlipidemia   . Murmur, cardiac   . Myocardial infarction ([place]) 2016  . Osteoarthritis    "left hip and knee" (08/06/2016)  . Type II diabetes mellitus ([facility])     Tobacco Use: Social History   Tobacco Use  Smoking Status Former Smoker  . Packs/day: 1.00  . Years: 10.00  . Pack years: 10.00  . Types: Cigarettes  . Last attempt to quit: 1982  . Years since quitting: 37.8  Smokeless Tobacco Never Used    Labs: Recent Chemical engineer    Labs for ITP Cardiac and Pulmonary Rehab Latest Ref Rng & Units 05/17/2018 05/17/2018 05/17/2018 05/17/2018 05/18/2018   Cholestrol 100 - 199 mg/dL - - - - -   LDLCALC 0 - 99 mg/dL - - - - -   HDL >39 mg/dL - - - - -   Trlycerides 0 - 149 mg/dL - - - - -   Hemoglobin A1c 4.8 - 5.6 % - - - - -   PHART 7.350 - 7.450 7.343(L) 7.343(L) 7.317(L) - -   PCO2ART 32.0 - 48.0  mmHg 44.2 38.7 41.9 - -   HCO3 20.0 - 28.0 mmol/L 24.1 21.0 21.3 - -   TCO2 22 - 32 mmol/L 25 22 22  21(L) 20(L)   ACIDBASEDEF 0.0 - 2.0 mmol/L 2.0 4.0(H) 4.0(H) - -   O2SAT % 98.0 96.0 97.0 - -      Capillary Blood Glucose: Lab Results  Component Value Date   GLUCAP 128 (H) 06/29/2018   GLUCAP 154 (H) 05/24/2018   GLUCAP 130 (H) 05/24/2018   GLUCAP 92 05/23/2018   GLUCAP 136 (H) 05/23/2018     Exercise Target Goals: Exercise Program Goal: Individual exercise prescription set using results from initial 6 min walk test and THRR while considering  patient's activity barriers and safety.   Exercise Prescription Goal: Starting with aerobic activity 30 plus minutes a day, 3 days per week for initial exercise prescription. Provide home exercise prescription and guidelines that participant acknowledges understanding prior to discharge.  Activity Barriers & Risk  Stratification: Activity Barriers & Cardiac Risk Stratification - 07/05/18 1344      Activity Barriers & Cardiac Risk Stratification   Activity Barriers  Left Hip Replacement;Other (comment);Deconditioning    Comments  Left knee arthroscopy, dizziness    Cardiac Risk Stratification  High       6 Minute Walk: 6 Minute Walk    Row Name 07/05/18 1342         6 Minute Walk   Phase  Initial     Distance  1478 feet     Walk Time  6 minutes     # of Rest Breaks  0     MPH  2.8     METS  3.31     RPE  12     VO2 Peak  11.58     Symptoms  No     Resting HR  78 bpm     Resting BP  118/68     Resting Oxygen Saturation   95 %     Exercise Oxygen Saturation  during 6 min walk  97 %     Max Ex. HR  111 bpm     Max Ex. BP  142/72     2 Minute Post BP  104/72        Oxygen Initial Assessment:   Oxygen Re-Evaluation:   Oxygen Discharge (Final Oxygen Re-Evaluation):   Initial Exercise Prescription: Initial Exercise Prescription - 07/05/18 1400      Date of Initial Exercise RX and Referring Provider   Date  07/05/18    Referring Provider  Dr. Daneen Schick    Expected Discharge Date  10/14/18      Recumbant Bike   Level  2    Watts  35    Minutes  10    METs  3.2      NuStep   Level  3    SPM  75    Minutes  10    METs  3.2      Track   Laps  13    Minutes  10    METs  3.1      Prescription Details   Frequency (times per week)  3    Duration  Progress to 30 minutes of continuous aerobic without signs/symptoms of physical distress      Intensity   THRR 40-80% of Max Heartrate  63-126    Ratings of Perceived Exertion  11-13      Progression   Progression  Continue  to progress workloads to maintain intensity without signs/symptoms of physical distress.      Resistance Training   Training Prescription  Yes    Weight  2    Reps  10-15       Perform Capillary Blood Glucose checks as needed.  Exercise Prescription Changes:   Exercise  Comments:   Exercise Goals and Review:  Exercise Goals    Row Name 07/05/18 1346             Exercise Goals   Increase Physical Activity  Yes       Intervention  Provide advice, education, support and counseling about physical activity/exercise needs.;Develop an individualized exercise prescription for aerobic and resistive training based on initial evaluation findings, risk stratification, comorbidities and participant's personal goals.       Expected Outcomes  Short Term: Attend rehab on a regular basis to increase amount of physical activity.       Increase Strength and Stamina  Yes       Intervention  Provide advice, education, support and counseling about physical activity/exercise needs.;Develop an individualized exercise prescription for aerobic and resistive training based on initial evaluation findings, risk stratification, comorbidities and participant's personal goals.       Expected Outcomes  Short Term: Increase workloads from initial exercise prescription for resistance, speed, and METs.       Able to understand and use rate of perceived exertion (RPE) scale  Yes       Intervention  Provide education and explanation on how to use RPE scale       Expected Outcomes  Short Term: Able to use RPE daily in rehab to express subjective intensity level;Long Term:  Able to use RPE to guide intensity level when exercising independently       Knowledge and understanding of Target Heart Rate Range (THRR)  Yes       Intervention  Provide education and explanation of THRR including how the numbers were predicted and where they are located for reference       Expected Outcomes  Short Term: Able to state/look up THRR;Long Term: Able to use THRR to govern intensity when exercising independently;Short Term: Able to use daily as guideline for intensity in rehab       Able to check pulse independently  Yes       Intervention  Provide education and demonstration on how to check pulse in carotid and  radial arteries.;Review the importance of being able to check your own pulse for safety during independent exercise       Expected Outcomes  Short Term: Able to explain why pulse checking is important during independent exercise;Long Term: Able to check pulse independently and accurately       Understanding of Exercise Prescription  Yes       Intervention  Provide education, explanation, and written materials on patient's individual exercise prescription       Expected Outcomes  Short Term: Able to explain program exercise prescription;Long Term: Able to explain home exercise prescription to exercise independently          Exercise Goals Re-Evaluation :    Discharge Exercise Prescription (Final Exercise Prescription Changes):   Nutrition:  Target Goals: Understanding of nutrition guidelines, daily intake of sodium 1500mg , cholesterol 200mg , calories 30% from fat and 7% or less from saturated fats, daily to have 5 or more servings of fruits and vegetables.  Biometrics: Pre Biometrics - 07/05/18 1347      Pre Biometrics  Height  5\' 2"  (1.575 m)    Weight  90.1 kg    Waist Circumference  41.5 inches    Hip Circumference  47.5 inches    Waist to Hip Ratio  0.87 %    BMI (Calculated)  36.32    Triceps Skinfold  26 mm    % Body Fat  45.5 %    Grip Strength  26 kg    Flexibility  16 in    Single Leg Stand  16.84 seconds        Nutrition Therapy Plan and Nutrition Goals: Nutrition Therapy & Goals - 07/05/18 1354      Nutrition Therapy   Diet  heart healthy carb modified      Personal Nutrition Goals   Nutrition Goal  Pt to identify and limit food sources of saturated fat, trans fat, refined carbohydrates and sodium    Personal Goal #2  Pt able to name foods that affect blood glucose.    Personal Goal #3  Pt to identify food quantities necessary to achieve weight loss of 6-24 lbs. at graduation from cardiac rehab. Pt shared ultimate goal weight of 160-165 lbs     Personal  Goal #4  Improved blood glucose control as evidenced by pt's A1c trending from 9.3 toward less than 7.0.      Intervention Plan   Intervention  Prescribe, educate and counsel regarding individualized specific dietary modifications aiming towards targeted core components such as weight, hypertension, lipid management, diabetes, heart failure and other comorbidities.    Expected Outcomes  Short Term Goal: Understand basic principles of dietary content, such as calories, fat, sodium, cholesterol and nutrients.;Long Term Goal: Adherence to prescribed nutrition plan.       Nutrition Assessments: Nutrition Assessments - 07/05/18 1355      MEDFICTS Scores   Pre Score  36       Nutrition Goals Re-Evaluation: Nutrition Goals Re-Evaluation    [facility] Name 07/05/18 1354             Goals   Current Weight  198 lb 10.2 oz (90.1 kg)          Nutrition Goals Discharge (Final Nutrition Goals Re-Evaluation): Nutrition Goals Re-Evaluation - 07/05/18 1354      Goals   Current Weight  198 lb 10.2 oz (90.1 kg)       Psychosocial: Target Goals: Acknowledge presence or absence of significant depression and/or stress, maximize coping skills, provide positive support system. Participant is able to verbalize types and ability to use techniques and skills needed for reducing stress and depression.  Initial Review & Psychosocial Screening: Initial Psych Review & Screening - 07/05/18 1520      Initial Review   Current issues with  None Identified      Family Dynamics   Good Support System?  Yes   Alpa has her siblings, dauther, son and law and church family for support     Barriers   Psychosocial barriers to participate in program  There are no identifiable barriers or psychosocial needs.      Screening Interventions   Interventions  Encouraged to exercise       Quality of Life Scores: Quality of Life - 07/05/18 1348      Quality of Life   Select  Quality of Life      Quality of  Life Scores   Health/Function Pre  26.4 %    Socioeconomic Pre  28.94 %    Psych/Spiritual Pre  28.29 %    Family Pre  30 %    GLOBAL Pre  27.87 %      Scores of 19 and below usually indicate a poorer quality of life in these areas.  A difference of  2-3 points is a clinically meaningful difference.  A difference of 2-3 points in the total score of the Quality of Life Index has been associated with significant improvement in overall quality of life, self-image, physical symptoms, and general health in studies assessing change in quality of life.  PHQ-9: Recent Review Flowsheet Data    There is no flowsheet data to display.     Interpretation of Total Score  Total Score Depression Severity:  1-4 = Minimal depression, 5-9 = Mild depression, 10-14 = Moderate depression, 15-19 = Moderately severe depression, 20-27 = Severe depression   Psychosocial Evaluation and Intervention:   Psychosocial Re-Evaluation:   Psychosocial Discharge (Final Psychosocial Re-Evaluation):   Vocational Rehabilitation: Provide vocational rehab assistance to qualifying candidates.   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 Participant will state understanding/return demonstration of topics presented.  Learning Barriers/Preferences: Learning Barriers/Preferences - 07/05/18 1349      Learning Barriers/Preferences   Learning Barriers  Sight;Exercise Concerns   Dizziness   Learning Preferences  Written Material;Verbal Instruction;Audio       Education Topics: Hypertension, Hypertension Reduction -Define heart disease and high blood pressure. Discus how high blood pressure  affects the body and ways to reduce high blood pressure.   Exercise and Your Heart -Discuss why it is important to exercise, the FITT principles of exercise, normal and abnormal responses to exercise, and how to exercise safely.   Angina -Discuss definition of angina, causes of angina, treatment of angina, and how to decrease risk of having angina.   Cardiac Medications -Review what the following cardiac medications are used for, how they affect the body, and side effects that may occur when taking the medications.  Medications include Aspirin, Beta blockers, calcium channel blockers, ACE Inhibitors, angiotensin receptor blockers, diuretics, digoxin, and antihyperlipidemics.   Congestive Heart Failure -Discuss the definition of CHF, how to live with CHF, the signs and symptoms of CHF, and how keep track of weight and sodium intake.   Heart Disease and Intimacy -Discus the effect sexual activity has on the heart, how changes occur during intimacy as we age, and safety during sexual activity.   Smoking Cessation / COPD -Discuss different methods to quit smoking, the health benefits of quitting smoking, and the definition of COPD.   Nutrition I: Fats -Discuss the types of cholesterol, what cholesterol does to the heart, and how cholesterol levels can be controlled.   Nutrition II: Labels -Discuss the different components of food labels and how to read food label   Heart Parts/Heart Disease and PAD -Discuss the anatomy of the heart, the pathway of blood circulation through the heart, and these are affected by heart disease.   Stress I: Signs and Symptoms -Discuss the causes of stress, how stress may lead to anxiety and depression, and ways to limit stress.   Stress II: Relaxation -Discuss different types of relaxation techniques to limit stress.  Warning Signs of Stroke / TIA -Discuss definition of a stroke, what the signs and symptoms are of a stroke, and how to identify  when someone is having stroke.   Knowledge Questionnaire Score: Knowledge Questionnaire Score - 07/05/18 1350      Knowledge Questionnaire Score   Pre Score  21/24       Core Components/Risk Factors/Patient Goals at Admission: Personal Goals and Risk Factors at Admission - 07/05/18 1350      Core Components/Risk Factors/Patient Goals on Admission    Weight Management  Yes;Obesity;Weight Maintenance;Weight Loss    Intervention  Weight Management: Develop a combined nutrition and exercise program designed to reach desired caloric intake, while maintaining appropriate intake of nutrient and fiber, sodium and fats, and appropriate energy expenditure required for the weight goal.;Weight Management: Provide education and appropriate resources to help participant work on and attain dietary goals.;Weight Management/Obesity: Establish reasonable short term and long term weight goals.;Obesity: Provide education and appropriate resources to help participant work on and attain dietary goals.    Admit Weight  198 lb 10.2 oz (90.1 kg)    Expected Outcomes  Short Term: Continue to assess and modify interventions until short term weight is achieved;Long Term: Adherence to nutrition and physical activity/exercise program aimed toward attainment of established weight goal;Weight Maintenance: Understanding of the daily nutrition guidelines, which includes 25-35% calories from fat, 7% or less cal from saturated fats, less than 200mg  cholesterol, less than 1.5gm of sodium, & 5 or more servings of fruits and vegetables daily;Weight Loss: Understanding of general recommendations for a balanced deficit meal plan, which promotes 1-2 lb weight loss per week and includes a negative energy balance of [phone] kcal/d;Understanding recommendations for meals to include 15-35% energy as protein, 25-35% energy from fat, 35-60% energy from carbohydrates, less than 200mg  of dietary cholesterol, 20-35 gm of total fiber  daily;Understanding of distribution of calorie intake throughout the day with the consumption of 4-5 meals/snacks    Diabetes  Yes    Intervention  Provide education about signs/symptoms and action to take for hypo/hyperglycemia.;Provide education about proper nutrition, including hydration, and aerobic/resistive exercise prescription along with prescribed medications to achieve blood glucose in normal ranges: Fasting glucose 65-99 mg/dL    Expected Outcomes  Short Term: Participant verbalizes understanding of the signs/symptoms and immediate care of hyper/hypoglycemia, proper foot care and importance of medication, aerobic/resistive exercise and nutrition plan for blood glucose control.;Long Term: Attainment of HbA1C < 7%.    Hypertension  Yes    Intervention  Provide education on lifestyle modifcations including regular physical activity/exercise, weight management, moderate sodium restriction and increased consumption of fresh fruit, vegetables, and low fat dairy, alcohol moderation, and smoking cessation.;Monitor prescription use compliance.    Expected Outcomes  Short Term: Continued assessment and intervention until BP is < 140/45mm HG in hypertensive participants. < 130/12mm HG in hypertensive participants with diabetes, heart failure or chronic kidney disease.;Long Term: Maintenance of blood pressure at goal levels.    Lipids  Yes    Intervention  Provide education and support for participant on nutrition & aerobic/resistive exercise along with prescribed medications to achieve LDL 70mg , HDL >40mg .    Expected Outcomes  Short Term: Participant states understanding of desired cholesterol values and is compliant with medications prescribed. Participant is following exercise prescription and nutrition guidelines.;Long Term: Cholesterol controlled with medications as prescribed, with individualized exercise RX and with personalized nutrition plan. Value goals: LDL < 70mg , HDL > 40 mg.  Core  Components/Risk Factors/Patient Goals Review:    Core Components/Risk Factors/Patient Goals at Discharge (Final Review):    ITP Comments: ITP Comments    Row Name 07/05/18 0900           ITP Comments  Medical Director- Dr. Fransico Him, MD          Comments:Maddilynn attended orientation from [phone] to 1023 to review rules and guidelines for program. Completed 6 minute walk test, Intitial ITP, and exercise prescription.  VSS. Telemetry-Sinus Rhythm. Miyoshi did report experiencing mild shortness of breath during her walk test, this resolved with rest.Nahiem Dredge Venetia Maxon, RN,BSN 07/05/2018 3:36 PM

## 2018-07-11 ENCOUNTER — Encounter (HOSPITAL_COMMUNITY): Payer: 59

## 2018-07-11 ENCOUNTER — Encounter (HOSPITAL_COMMUNITY)
Admission: RE | Admit: 2018-07-11 | Discharge: 2018-07-11 | Disposition: A | Discharge status: Home / Self Care | Payer: 59 | Source: Ambulatory Visit | Attending: Interventional Cardiology | Admitting: Interventional Cardiology

## 2018-07-11 DIAGNOSIS — I252 Old myocardial infarction: Secondary | ICD-10-CM | POA: Insufficient documentation

## 2018-07-11 DIAGNOSIS — Z951 Presence of aortocoronary bypass graft: Secondary | ICD-10-CM | POA: Diagnosis not present

## 2018-07-11 DIAGNOSIS — E119 Type 2 diabetes mellitus without complications: Secondary | ICD-10-CM | POA: Insufficient documentation

## 2018-07-11 DIAGNOSIS — M1612 Unilateral primary osteoarthritis, left hip: Secondary | ICD-10-CM | POA: Insufficient documentation

## 2018-07-11 DIAGNOSIS — Z902 Acquired absence of lung [part of]: Secondary | ICD-10-CM | POA: Insufficient documentation

## 2018-07-11 DIAGNOSIS — Z7984 Long term (current) use of oral hypoglycemic drugs: Secondary | ICD-10-CM | POA: Diagnosis not present

## 2018-07-11 DIAGNOSIS — Z96642 Presence of left artificial hip joint: Secondary | ICD-10-CM | POA: Diagnosis not present

## 2018-07-11 DIAGNOSIS — F329 Major depressive disorder, single episode, unspecified: Secondary | ICD-10-CM | POA: Diagnosis not present

## 2018-07-11 DIAGNOSIS — M1712 Unilateral primary osteoarthritis, left knee: Secondary | ICD-10-CM | POA: Diagnosis not present

## 2018-07-11 DIAGNOSIS — Z952 Presence of prosthetic heart valve: Secondary | ICD-10-CM | POA: Insufficient documentation

## 2018-07-11 DIAGNOSIS — I2510 Atherosclerotic heart disease of native coronary artery without angina pectoris: Secondary | ICD-10-CM | POA: Insufficient documentation

## 2018-07-11 DIAGNOSIS — Z87891 Personal history of nicotine dependence: Secondary | ICD-10-CM | POA: Insufficient documentation

## 2018-07-11 DIAGNOSIS — E785 Hyperlipidemia, unspecified: Secondary | ICD-10-CM | POA: Diagnosis not present

## 2018-07-11 DIAGNOSIS — Z79899 Other long term (current) drug therapy: Secondary | ICD-10-CM | POA: Insufficient documentation

## 2018-07-11 LAB — GLUCOSE, CAPILLARY
GLUCOSE-CAPILLARY: 155 mg/dL — AB (ref 70–99)
Glucose-Capillary: 124 mg/dL — ABNORMAL HIGH (ref 70–99)

## 2018-07-11 NOTE — Progress Notes (Signed)
Daily Session Note  Patient Details  Name: Sarah Stevenson MRN: [id number] Date of Birth: [birth year] Referring Provider:     CARDIAC REHAB PHASE II ORIENTATION from 07/05/2018 in [place]  Referring Provider  Dr. Daneen Schick      Encounter Date: 07/11/2018  Check In: Session Check In - 07/11/18 1028      Check-In   Supervising physician immediately available to respond to emergencies  [facility] immediately available    Physician(s)  Dr. Algis Liming     Location  [facility]    Staff Present  Jiles Garter, RN, BSN;Dalton Kris Mouton, MS, Exercise Physiologist;Tyara Carol Ada, MS,ACSM CEP, Exercise Physiologist;Joann Rion, RN, BSN    Medication changes reported      No    Fall or balance concerns reported     No    Tobacco Cessation  No Change    Warm-up and Cool-down  Performed as group-led instruction    Resistance Training Performed  Yes    VAD Patient?  No    PAD/SET Patient?  No      Pain Assessment   Currently in Pain?  No/denies       Capillary Blood Glucose: Results for orders placed or performed during the hospital encounter of 07/11/18 (from the past 24 hour(s))  Glucose, capillary     Status: Abnormal   Collection Time: 07/11/18  9:54 AM  Result Value Ref Range   Glucose-Capillary 155 (H) 70 - 99 mg/dL  Glucose, capillary     Status: Abnormal   Collection Time: 07/11/18 10:36 AM  Result Value Ref Range   Glucose-Capillary 124 (H) 70 - 99 mg/dL      Social History   Tobacco Use  Smoking Status Former Smoker  . Packs/day: 1.00  . Years: 10.00  . Pack years: 10.00  . Types: Cigarettes  . Last attempt to quit: 1982  . Years since quitting: 37.8  Smokeless Tobacco Never Used    Goals Met:  Exercise tolerated well  Goals Unmet:  Not Applicable  Comments: Pt started cardiac rehab today.  Pt tolerated light exercise without difficulty. VSS, telemetry-SR, asymptomatic.  Medication list reconciled. Pt  denies barriers to medicaiton compliance.  PSYCHOSOCIAL ASSESSMENT:  PHQ-0. Pt exhibits positive coping skills, hopeful outlook with supportive family. No psychosocial needs identified at this time, no psychosocial interventions necessary.  Pt oriented to exercise equipment and routine.    Understanding verbalized.    Dr. Fransico Him is Medical Director for Cardiac Rehab at [facility].

## 2018-07-12 ENCOUNTER — Other Ambulatory Visit: Payer: Self-pay | Admitting: *Deleted

## 2018-07-12 ENCOUNTER — Telehealth: Payer: Self-pay

## 2018-07-12 ENCOUNTER — Encounter: Payer: Self-pay | Admitting: Internal Medicine

## 2018-07-12 ENCOUNTER — Inpatient Hospital Stay: Payer: 59

## 2018-07-12 ENCOUNTER — Inpatient Hospital Stay: Payer: 59 | Attending: Internal Medicine | Admitting: Internal Medicine

## 2018-07-12 VITALS — BP 121/73 | HR 77 | Temp 98.2°F | Resp 18 | Ht 62.0 in | Wt 198.0 lb

## 2018-07-12 DIAGNOSIS — Z7984 Long term (current) use of oral hypoglycemic drugs: Secondary | ICD-10-CM | POA: Diagnosis not present

## 2018-07-12 DIAGNOSIS — C3492 Malignant neoplasm of unspecified part of left bronchus or lung: Secondary | ICD-10-CM

## 2018-07-12 DIAGNOSIS — M1990 Unspecified osteoarthritis, unspecified site: Secondary | ICD-10-CM | POA: Diagnosis not present

## 2018-07-12 DIAGNOSIS — R0609 Other forms of dyspnea: Secondary | ICD-10-CM | POA: Insufficient documentation

## 2018-07-12 DIAGNOSIS — Z87891 Personal history of nicotine dependence: Secondary | ICD-10-CM | POA: Insufficient documentation

## 2018-07-12 DIAGNOSIS — Z79899 Other long term (current) drug therapy: Secondary | ICD-10-CM | POA: Diagnosis not present

## 2018-07-12 DIAGNOSIS — F329 Major depressive disorder, single episode, unspecified: Secondary | ICD-10-CM | POA: Insufficient documentation

## 2018-07-12 DIAGNOSIS — C3490 Malignant neoplasm of unspecified part of unspecified bronchus or lung: Secondary | ICD-10-CM

## 2018-07-12 DIAGNOSIS — Z951 Presence of aortocoronary bypass graft: Secondary | ICD-10-CM | POA: Diagnosis not present

## 2018-07-12 DIAGNOSIS — I2510 Atherosclerotic heart disease of native coronary artery without angina pectoris: Secondary | ICD-10-CM | POA: Insufficient documentation

## 2018-07-12 DIAGNOSIS — I350 Nonrheumatic aortic (valve) stenosis: Secondary | ICD-10-CM | POA: Diagnosis not present

## 2018-07-12 DIAGNOSIS — E119 Type 2 diabetes mellitus without complications: Secondary | ICD-10-CM | POA: Insufficient documentation

## 2018-07-12 DIAGNOSIS — E785 Hyperlipidemia, unspecified: Secondary | ICD-10-CM | POA: Insufficient documentation

## 2018-07-12 DIAGNOSIS — I252 Old myocardial infarction: Secondary | ICD-10-CM | POA: Diagnosis not present

## 2018-07-12 LAB — CBC WITH DIFFERENTIAL (CANCER CENTER ONLY)
Abs Immature Granulocytes: 0.02 10*3/uL (ref 0.00–0.07)
BASOS ABS: 0 10*3/uL (ref 0.0–0.1)
Basophils Relative: 1 %
EOS ABS: 0.1 10*3/uL (ref 0.0–0.5)
EOS PCT: 2 %
HEMATOCRIT: 35.5 % — AB (ref 36.0–46.0)
Hemoglobin: 10.9 g/dL — ABNORMAL LOW (ref 12.0–15.0)
Immature Granulocytes: 0 %
LYMPHS ABS: 2.1 10*3/uL (ref 0.7–4.0)
Lymphocytes Relative: 34 %
MCH: 27.5 pg (ref 26.0–34.0)
MCHC: 30.7 g/dL (ref 30.0–36.0)
MCV: 89.6 fL (ref 80.0–100.0)
MONO ABS: 0.6 10*3/uL (ref 0.1–1.0)
Monocytes Relative: 10 %
NRBC: 0 % (ref 0.0–0.2)
Neutro Abs: 3.3 10*3/uL (ref 1.7–7.7)
Neutrophils Relative %: 53 %
Platelet Count: 255 10*3/uL (ref 150–400)
RBC: 3.96 MIL/uL (ref 3.87–5.11)
RDW: 12.7 % (ref 11.5–15.5)
WBC: 6.2 10*3/uL (ref 4.0–10.5)

## 2018-07-12 LAB — CMP (CANCER CENTER ONLY)
ALBUMIN: 3.5 g/dL (ref 3.5–5.0)
ALT: 10 U/L (ref 0–44)
ANION GAP: 8 (ref 5–15)
AST: 11 U/L — ABNORMAL LOW (ref 15–41)
Alkaline Phosphatase: 92 U/L (ref 38–126)
BILIRUBIN TOTAL: 0.3 mg/dL (ref 0.3–1.2)
BUN: 10 mg/dL (ref 8–23)
CALCIUM: 9.1 mg/dL (ref 8.9–10.3)
CO2: 26 mmol/L (ref 22–32)
Chloride: 105 mmol/L (ref 98–111)
Creatinine: 1.03 mg/dL — ABNORMAL HIGH (ref 0.44–1.00)
GFR, Estimated: 57 mL/min — ABNORMAL LOW (ref >60)
GLUCOSE: 142 mg/dL — AB (ref 70–99)
POTASSIUM: 4.1 mmol/L (ref 3.5–5.1)
Sodium: 139 mmol/L (ref 135–145)
TOTAL PROTEIN: 7.3 g/dL (ref 6.5–8.1)

## 2018-07-12 NOTE — Telephone Encounter (Signed)
Printed avs and calender of upcoming appointment per 11/5 los

## 2018-07-12 NOTE — Progress Notes (Signed)
[place] [phone]   [phone]  CONSULT NOTE  REFERRING PHYSICIAN: Dr. Lanelle Bal.  REASON FOR CONSULTATION:  62 years old white female recently diagnosed with lung cancer.  HPI Sarah Stevenson is a 62 y.o. female with past medical history significant for coronary artery disease status post myocardial infarction and stent several times, depression, dyslipidemia, osteoarthritis, diabetes mellitus, aortic stenosis and angina.  The patient mentions that she has been complaining of increasing shortness of breath secondary to severe aortic stenosis that was getting worse and July 2019.  She had CT angiogram of the chest on April 15, 2018 for evaluation of her aortic stenosis which showed no evidence of thoracic aortic aneurysm.  There was exuberant calcification within the aortic leaflets compatible with history of aortic stenosis as well as cardiomegaly.  The patient had incidental finding of approximately 1.5 cm groundglass nodular opacity within the left upper lobe.  On 05/24/2018 the patient underwent aortic valve replacement with pericardial tissue valve as well as coronary artery bypass grafting x2 under the care of Dr. Servando Snare.  In the same setting she underwent wedge resection of the left upper lobe lung lesion.  The final pathology ([id number]) showed well-differentiated differentiated adenocarcinoma spanning 1.2 cm.  There was no evidence for visceral pleural or lymphovascular invasion.  There was no lymph node dissection. The patient recovered well from her surgery and she had a PET scan performed on 06/29/2018 and that showed postsurgical changes in the left upper lobe without clear evidence of carcinoma.  There was no evidence of metastatic disease. Dr. Servando Snare kindly referred the patient to me today for evaluation and recommendation regarding her condition.  When seen today she is feeling fine except for shortness of breath with exertion.   She is currently undergoing cardiac rehab.  She denied having any chest pain, cough or hemoptysis.  She lost few pounds during her surgical..  She denied having any headache or visual changes but she has occasional dizzy spells secondary to low blood pressure. Family history significant for mother died from heart disease at age 58 father died from heart disease at age 36 one brother had lung cancer. The patient is single and has 1 son.  She works as a Marine scientist at [employer].  She was accompanied by a friend, Fritz Pickerel.  She has a history for smoking for around 12 years but she quit in 1985.  She has no history of alcohol or drug abuse.  HPI  Past Medical History:  Diagnosis Date  . Anginal pain ([place])   . Aortic stenosis   . Coronary artery disease   . Depression    "mild" (08/06/2016)  . Dyspnea   . Hyperlipidemia   . Murmur, cardiac   . Myocardial infarction ([place]) 2016  . Osteoarthritis    "left hip and knee" (08/06/2016)  . Type II diabetes mellitus ([place])     Past Surgical History:  Procedure Laterality Date  . AORTIC VALVE REPLACEMENT N/A 05/17/2018   Procedure: AORTIC VALVE REPLACEMENT (AVR) USING 21 MM MAGNA EASE PERICARDIAL BIOPROSTHESIS. MODEL # [id number]. SERIAL # [id number];  Surgeon: Grace Isaac, MD;  Location: [place] [facility];  Service: Open Heart Surgery;  Laterality: N/A;  . CARDIAC CATHETERIZATION Right 11/30/2014   Procedure: RIGHT/LEFT HEART CATH AND CORONARY ANGIOGRAPHY;  Surgeon: Belva Crome, MD;  Location: [facility] CATH LAB;  Service: Cardiovascular;  Laterality: Right;  . CARDIAC CATHETERIZATION N/A 12/13/2015   Procedure: Left Heart Cath  and Coronary Angiography;  Surgeon: Leonie Man, MD;  Location: [place] CV LAB;  Service: Cardiovascular;  Laterality: N/A;  . CARDIAC CATHETERIZATION N/A 12/13/2015   Procedure: Intravascular Pressure Wire/FFR Study;  Surgeon: Leonie Man, MD;  Location: [place] CV LAB;  Service: Cardiovascular;  Laterality: N/A;  RCA  . CARDIAC  CATHETERIZATION N/A 08/06/2016   Procedure: Right/Left Heart Cath and Coronary Angiography;  Surgeon: Lorretta Harp, MD;  Location: [place] CV LAB;  Service: Cardiovascular;  Laterality: N/A;  . CARDIAC CATHETERIZATION N/A 08/06/2016   Procedure: Coronary Stent Intervention;  Surgeon: Lorretta Harp, MD;  Location: [place] CV LAB;  Service: Cardiovascular;  Laterality: N/A;   distal rca 3.0x16 and 3.0x8 synergy  . CESAREAN SECTION  1989  . CORONARY ANGIOPLASTY WITH STENT PLACEMENT  08/06/2016   "2 stents"  . CORONARY ARTERY BYPASS GRAFT N/A 05/17/2018   Procedure: CORONARY ARTERY BYPASS GRAFTING (CABG) X 2 WITH ENDOSCOPIC HARVESTING OF RIGHT SAPHENOUS VEIN. LIMA TO LAD. SVG TO RCA;  Surgeon: Grace Isaac, MD;  Location: [place];  Service: Open Heart Surgery;  Laterality: N/A;  . HYSTEROSCOPY DIAGNOSTIC  1990s  . JOINT REPLACEMENT    . KNEE ARTHROSCOPY Left 1996  . NASAL SINUS SURGERY  1977  . RIGHT/LEFT HEART CATH AND CORONARY ANGIOGRAPHY N/A 04/01/2018   Procedure: RIGHT/LEFT HEART CATH AND CORONARY ANGIOGRAPHY;  Surgeon: Belva Crome, MD;  Location: [place] CV LAB;  Service: Cardiovascular;  Laterality: N/A;  . TEE WITHOUT CARDIOVERSION N/A 05/17/2018   Procedure: TRANSESOPHAGEAL ECHOCARDIOGRAM (TEE);  Surgeon: Grace Isaac, MD;  Location: [place] [facility];  Service: Open Heart Surgery;  Laterality: N/A;  . TOTAL HIP ARTHROPLASTY Left 04/23/2017   Procedure: LEFT TOTAL HIP ARTHROPLASTY ANTERIOR APPROACH;  Surgeon: Mcarthur Rossetti, MD;  Location: [facility];  Service: Orthopedics;  Laterality: Left;  . WEDGE RESECTION Left 05/17/2018   Procedure: LEFT UPPER LOBE LUNG WEDGE RESECTION;  Surgeon: Grace Isaac, MD;  Location: [place];  Service: Open Heart Surgery;  Laterality: Left;    Family History  Problem Relation Age of Onset  . Heart attack Mother   . Heart attack Father   . Sudden death Father   . Hypertension Neg Hx   . Hyperlipidemia Neg Hx   . Diabetes Neg Hx      Social History Social History   Tobacco Use  . Smoking status: Former Smoker    Packs/day: 1.00    Years: 10.00    Pack years: 10.00    Types: Cigarettes    Last attempt to quit: 1982    Years since quitting: 37.8  . Smokeless tobacco: Never Used  . Tobacco comment: restarted  about 5-6 yrs ago, then stopped  Substance Use Topics  . Alcohol use: Yes    Alcohol/week: 7.0 standard drinks    Types: 7 Shots of liquor per week    Comment: weekly  . Drug use: No    Allergies  Allergen Reactions  . Brilinta [Ticagrelor] Shortness Of Breath  . Lisinopril Cough  . Asa [Aspirin] Hives and Rash  . Blue Dyes (Parenteral) Hives  . Corn-Containing Products Hives  . Hetastarch Hives and Rash  . Nsaids Hives and Rash  . Sugar-Protein-Starch Hives  . Tea Hives  . Isosulfan Blue Rash  . Other Itching and Rash    CHG soap    Current Outpatient Medications  Medication Sig Dispense Refill  . acetaminophen (TYLENOL) 500 MG tablet Take 500-1,000 mg  by mouth 2 (two) times daily as needed for moderate pain.    . carvedilol (COREG) 6.25 MG tablet Take 1 tablet (6.25 mg total) by mouth 2 (two) times daily with a meal. 180 tablet 3  . cetirizine (ZYRTEC) 10 MG tablet Take 10 mg by mouth daily.    . famotidine (PEPCID) 20 MG tablet Take 20 mg by mouth at bedtime.    . fluticasone (FLONASE) 50 MCG/ACT nasal spray Place 1 spray into both nostrils daily as needed for allergies or rhinitis.    Marland Kitchen glipiZIDE (GLUCOTROL XL) 10 MG 24 hr tablet Take 10 mg by mouth daily with breakfast.     . losartan (COZAAR) 25 MG tablet Take 25 mg by mouth daily.    . Menthol-Methyl Salicylate (MUSCLE RUB EX) Apply 1 application topically as needed (for pain).    . metFORMIN (GLUCOPHAGE-XR) 750 MG 24 hr tablet Take 1,500 mg by mouth every evening.    . prasugrel (EFFIENT) 10 MG TABS tablet Take 1 tablet (10 mg total) by mouth daily. 90 tablet 2  . rosuvastatin (CRESTOR) 10 MG tablet Take 1 tablet (10 mg total) by  mouth daily. 90 tablet 3  . sertraline (ZOLOFT) 50 MG tablet Take 25 mg by mouth daily.     . TRULICITY 2.42 AS/3.4HD SOPN Inject 0.75 mg into the skin once a week.   3   No current facility-administered medications for this visit.     Review of Systems  Constitutional: positive for weight loss Eyes: negative Ears, nose, mouth, throat, and face: negative Respiratory: positive for dyspnea on exertion Cardiovascular: negative Gastrointestinal: negative Genitourinary:negative Integument/breast: negative Hematologic/lymphatic: negative Musculoskeletal:negative Neurological: negative Behavioral/Psych: negative Endocrine: negative Allergic/Immunologic: negative  Physical Exam  QQI:WLNLG, healthy, no distress, well nourished and well developed SKIN: skin color, texture, turgor are normal, no rashes or significant lesions HEAD: Normocephalic, No masses, lesions, tenderness or abnormalities EYES: normal, PERRLA, Conjunctiva are pink and non-injected EARS: External ears normal, Canals clear OROPHARYNX:no exudate, no erythema and lips, buccal mucosa, and tongue normal  NECK: supple, no adenopathy, no JVD LYMPH:  no palpable lymphadenopathy, no hepatosplenomegaly BREAST:not examined LUNGS: clear to auscultation , and palpation HEART: regular rate & rhythm, no murmurs and no gallops ABDOMEN:abdomen soft, non-tender, normal bowel sounds and no masses or organomegaly BACK: Back symmetric, no curvature., No CVA tenderness EXTREMITIES:no joint deformities, effusion, or inflammation, no edema  NEURO: alert & oriented x 3 with fluent speech, no focal motor/sensory deficits  PERFORMANCE STATUS: ECOG 1  LABORATORY DATA: Lab Results  Component Value Date   WBC 6.2 07/12/2018   HGB 10.9 (L) 07/12/2018   HCT 35.5 (L) 07/12/2018   MCV 89.6 07/12/2018   PLT 255 07/12/2018      Chemistry      Component Value Date/Time   NA 139 07/12/2018 1049   NA 139 06/08/2018 1043   K 4.1  07/12/2018 1049   CL 105 07/12/2018 1049   CO2 26 07/12/2018 1049   BUN 10 07/12/2018 1049   BUN 17 06/08/2018 1043   CREATININE 1.03 (H) 07/12/2018 1049      Component Value Date/Time   CALCIUM 9.1 07/12/2018 1049   ALKPHOS 92 07/12/2018 1049   AST 11 (L) 07/12/2018 1049   ALT 10 07/12/2018 1049   BILITOT 0.3 07/12/2018 1049       RADIOGRAPHIC STUDIES: Dg Chest 2 View  Result Date: 06/23/2018 CLINICAL DATA:  Left wedge resection May 17, 2018. Shortness of breath. EXAM: CHEST - 2  VIEW COMPARISON:  May 21, 2018 FINDINGS: The cardiomediastinal silhouette is stable. Persistent postoperative changes in the left apex are mildly improved. No pneumothorax. Lingular opacity is more prominent the interval. No other acute abnormalities. IMPRESSION: 1. Increasing lingular opacity could represent scar, atelectasis, or infiltrate. Recommend clinical correlation and follow-up to resolution. 2. Postoperative changes in the left apex are mildly improved. Electronically Signed   By: Dorise Bullion III M.D   On: 06/23/2018 14:23   Nm Pet Image Initial (pi) Skull Base To Thigh  Result Date: 06/30/2018 CLINICAL DATA:  Initial treatment strategy for pulmonary nodule. EXAM: NUCLEAR MEDICINE PET SKULL BASE TO THIGH TECHNIQUE: 9.6 mCi F-18 FDG was injected intravenously. Full-ring PET imaging was performed from the skull base to thigh after the radiotracer. CT data was obtained and used for attenuation correction and anatomic localization. Fasting blood glucose: 128 mg/dl COMPARISON:  Chest CT 04/15/2018 FINDINGS: Mediastinal blood pool activity: SUV max 2.9 NECK: No hypermetabolic lymph nodes in the neck. Incidental CT findings: none CHEST: Interval midline sternotomy and CABG. Moderate metabolic activity along the sternum related to postsurgical inflammation. Interval LEFT upper lobe wedge resection. There is staple line with parenchymal thickening in the LEFT upper lobe. There is mild metabolic  activity along this surgical margin with SUV max equal 3.9 which is felt benign postsurgical change. No discrete hypermetabolic nodule. No hypermetabolic mediastinal lymph nodes. There is a focus of atelectasis along the left cardiac border with SUV max equal 4.0 (image 81) which is also favored benign. Incidental CT findings: none ABDOMEN/PELVIS: No abnormal hypermetabolic activity within the liver, pancreas, adrenal glands, or spleen. No hypermetabolic lymph nodes in the abdomen or pelvis. Intense physiologic activity in the bowel. Incidental CT findings: Uterus and ovaries normal SKELETON: No focal hypermetabolic activity to suggest skeletal metastasis. Incidental CT findings: none IMPRESSION: 1. Postsurgical change in the LEFT upper lobe without clear evidence of carcinoma. Recommend follow-up CT surveillance. 2. No evidence of metastatic disease. 3. Interval sternotomy and CABG Electronically Signed   By: Suzy Bouchard M.D.   On: 06/30/2018 08:20    ASSESSMENT: This is a very pleasant 62 years old white female recently diagnosed with a stage IA (T1 a, Nx, Mx) non-small cell lung cancer, adenocarcinoma measuring 1.2 cm status post wedge resection of the left upper lobe on May 24, 2018 under the care of Dr. Servando Snare.   PLAN: I had a lengthy discussion with the patient today about her current disease stage, prognosis and treatment options.  I personally and independently reviewed her imaging studies and discussed the results with the patient today.  Her most recent PET scan showed no concerning findings for any residual or metastatic disease.  I explained to the patient that the 5-year survival for patient with a stage IA non-small cell lung cancer is around 80%. I explained to the patient that there is no survival benefit for adjuvant systemic chemotherapy for patient with a stage IA non-small cell lung cancer and the current standard of care is observation and close monitoring. I will arrange  for the patient to come back for follow-up visit in 6 months with repeat CT scan of the chest. She was advised to call immediately if she has any concerning symptoms in the interval.  The patient voices understanding of current disease status and treatment options and is in agreement with the current care plan.  All questions were answered. The patient knows to call the clinic with any problems, questions or concerns. We can certainly  see the patient much sooner if necessary.  Thank you so much for allowing me to participate in the care of Sarah Stevenson. I will continue to follow up the patient with you and assist in her care.  I spent 40 minutes counseling the patient face to face. The total time spent in the appointment was 60 minutes.  Disclaimer: This note was dictated with voice recognition software. Similar sounding words can inadvertently be transcribed and may not be corrected upon review.   Eilleen Kempf July 12, 2018, 12:05 PM

## 2018-07-13 ENCOUNTER — Encounter (HOSPITAL_COMMUNITY): Payer: 59

## 2018-07-13 ENCOUNTER — Encounter (HOSPITAL_COMMUNITY)
Admission: RE | Admit: 2018-07-13 | Discharge: 2018-07-13 | Disposition: A | Discharge status: Home / Self Care | Payer: 59 | Source: Ambulatory Visit | Attending: Interventional Cardiology | Admitting: Interventional Cardiology

## 2018-07-13 DIAGNOSIS — I2510 Atherosclerotic heart disease of native coronary artery without angina pectoris: Secondary | ICD-10-CM | POA: Diagnosis not present

## 2018-07-13 DIAGNOSIS — E119 Type 2 diabetes mellitus without complications: Secondary | ICD-10-CM | POA: Diagnosis not present

## 2018-07-13 DIAGNOSIS — E785 Hyperlipidemia, unspecified: Secondary | ICD-10-CM | POA: Diagnosis not present

## 2018-07-13 DIAGNOSIS — Z951 Presence of aortocoronary bypass graft: Secondary | ICD-10-CM

## 2018-07-13 DIAGNOSIS — Z79899 Other long term (current) drug therapy: Secondary | ICD-10-CM | POA: Diagnosis not present

## 2018-07-13 DIAGNOSIS — F329 Major depressive disorder, single episode, unspecified: Secondary | ICD-10-CM | POA: Diagnosis not present

## 2018-07-13 DIAGNOSIS — Z952 Presence of prosthetic heart valve: Secondary | ICD-10-CM

## 2018-07-13 DIAGNOSIS — I252 Old myocardial infarction: Secondary | ICD-10-CM | POA: Diagnosis not present

## 2018-07-13 DIAGNOSIS — Z902 Acquired absence of lung [part of]: Secondary | ICD-10-CM | POA: Diagnosis not present

## 2018-07-13 LAB — GLUCOSE, CAPILLARY: Glucose-Capillary: 150 mg/dL — ABNORMAL HIGH (ref 70–99)

## 2018-07-15 ENCOUNTER — Encounter (HOSPITAL_COMMUNITY): Payer: 59

## 2018-07-18 ENCOUNTER — Encounter (HOSPITAL_COMMUNITY)
Admission: RE | Admit: 2018-07-18 | Discharge: 2018-07-18 | Disposition: A | Discharge status: Home / Self Care | Payer: 59 | Source: Ambulatory Visit | Attending: Interventional Cardiology | Admitting: Interventional Cardiology

## 2018-07-18 ENCOUNTER — Telehealth: Payer: Self-pay | Admitting: Cardiology

## 2018-07-18 ENCOUNTER — Encounter (HOSPITAL_COMMUNITY): Payer: 59

## 2018-07-18 ENCOUNTER — Telehealth (HOSPITAL_COMMUNITY): Payer: Self-pay | Admitting: Family Medicine

## 2018-07-18 DIAGNOSIS — Z952 Presence of prosthetic heart valve: Secondary | ICD-10-CM

## 2018-07-18 DIAGNOSIS — Z951 Presence of aortocoronary bypass graft: Secondary | ICD-10-CM

## 2018-07-18 NOTE — Telephone Encounter (Signed)
Spoke with pt and scheduled her to see Cecilie Kicks, NP 11/18.

## 2018-07-18 NOTE — Progress Notes (Addendum)
Incomplete Session Note  Patient Details  Name: Sarah Stevenson MRN: [id number] Date of Birth: March 01, 1956 Referring Provider:     CARDIAC REHAB PHASE II ORIENTATION from 07/05/2018 in [facility]  Referring Provider  Dr. Daneen Schick      [name] did not complete her rehab session.  [name]  Reports feeling lightheaded today. Patient reports noticing the symptoms yesterday and today. Initial blood pressure 92/60 with the manual blood pressure cuff. Recheck blood pressure 117/70 with the automatic blood pressure cuff. Sitting BP 102/66. Heart rate 92. Standing blood pressure 114/70. Oxygen saturation 96% on Room Air. [place] said she restarted taking losartan around the 104 th of October.  Janya did not exercise today due to complaints of dizziness. Cecilie Kicks FNP-C paged and notified about Ms Reinwald's complaints and symptoms. Cecilie Kicks FNP instructed the patient to hold her losartan and that the office will call her regarding an upcoming blood pressure check.Barnet Pall, RN,BSN 07/18/2018 12:08 PM

## 2018-07-18 NOTE — Telephone Encounter (Signed)
Pt with dizzy episodes since adding losartan back.  Today 90 systolic, will stop losartan for now.  Have her have BP check in our office in a week.

## 2018-07-20 ENCOUNTER — Encounter (HOSPITAL_COMMUNITY): Payer: 59

## 2018-07-21 DIAGNOSIS — Z23 Encounter for immunization: Secondary | ICD-10-CM | POA: Diagnosis not present

## 2018-07-22 ENCOUNTER — Encounter (HOSPITAL_COMMUNITY): Payer: 59

## 2018-07-24 NOTE — Progress Notes (Signed)
Cardiology Office Note   Date:  07/26/2018   ID:  Sarah Stevenson, DOB 07-26-1956, MRN [id number]  PCP:  Christain Sacramento, MD  Cardiologist:  Dr. Tamala Julian     Chief Complaint  Patient presents with  . Hypotension    symptomatic      History of Present Illness: Sarah Stevenson is a 62 y.o. female who presents for recent addition of losartan and recurrent hypotension.    She has a hx of  CAD with prior RCA DES and severe diagonal #2 disease, and aortic stenosis treated with CABG and AVR. Found to have lung cancer at time of heart surgery.  Previous visits Sarah Stevenson was doing relatively well.  She is status post coronary bypass grafting x2, resection of adenocarcinoma of the right upper lobe, and aortic valve replacement.  Subsequent PET scan demonstrates no evidence of metastatic disease.  She is exercising but feels more short of breath than she did prior to surgery.  We discussed that this likely represented deconditioning after a major surgical procedure and postoperative course.  She is gradually improving.  She seems to develop musculoskeletal syndrome on statin therapy.  High intensity statin therapy was discontinued because of severe hip and musculoskeletal complaints.  More recently she was started on 10 mg of rosuvastatin per day and developed hives.  She stopped the medication more than a week ago.  She was to have blood work done today but since not on therapy, this will be delayed.  She has background history of recurrent hives therefore it is uncertain if statins have anything to do with the recent occurrence.  Last visit 07/04/18 she was stable.    Losartan was resumed and BP was low, in 40X systolic - losartan stopped.  Here for BP check.  Today off losartan BP 100/62.  No dizziness or lightheadedness today.  She is eating ok.  HR is 97 so do not want to decrease BB.  She is diabetic and would benefit with ARB.  I believe it is just too soon post op and she has not  recovered enough.  With allergy to ASA she is on effient.     Past Medical History:  Diagnosis Date  . Anginal pain ([place])   . Aortic stenosis   . Coronary artery disease   . Depression    "mild" (08/06/2016)  . Dyspnea   . Hyperlipidemia   . Murmur, cardiac   . Myocardial infarction ([place]) 2016  . Osteoarthritis    "left hip and knee" (08/06/2016)  . Type II diabetes mellitus ([place])     Past Surgical History:  Procedure Laterality Date  . AORTIC VALVE REPLACEMENT N/A 05/17/2018   Procedure: AORTIC VALVE REPLACEMENT (AVR) USING 21 MM MAGNA EASE PERICARDIAL BIOPROSTHESIS. MODEL # [id number]. SERIAL # [id number];  Surgeon: Grace Isaac, MD;  Location: [facility] [facility];  Service: Open Heart Surgery;  Laterality: N/A;  . CARDIAC CATHETERIZATION Right 11/30/2014   Procedure: RIGHT/LEFT HEART CATH AND CORONARY ANGIOGRAPHY;  Surgeon: Belva Crome, MD;  Location: [facility] CATH LAB;  Service: Cardiovascular;  Laterality: Right;  . CARDIAC CATHETERIZATION N/A 12/13/2015   Procedure: Left Heart Cath and Coronary Angiography;  Surgeon: Leonie Man, MD;  Location: [place] CV LAB;  Service: Cardiovascular;  Laterality: N/A;  . CARDIAC CATHETERIZATION N/A 12/13/2015   Procedure: Intravascular Pressure Wire/FFR Study;  Surgeon: Leonie Man, MD;  Location: [place] CV LAB;  Service: Cardiovascular;  Laterality: N/A;  RCA  . CARDIAC CATHETERIZATION  N/A 08/06/2016   Procedure: Right/Left Heart Cath and Coronary Angiography;  Surgeon: Lorretta Harp, MD;  Location: [place] CV LAB;  Service: Cardiovascular;  Laterality: N/A;  . CARDIAC CATHETERIZATION N/A 08/06/2016   Procedure: Coronary Stent Intervention;  Surgeon: Lorretta Harp, MD;  Location: [place] CV LAB;  Service: Cardiovascular;  Laterality: N/A;   distal rca 3.0x16 and 3.0x8 synergy  . CESAREAN SECTION  1989  . CORONARY ANGIOPLASTY WITH STENT PLACEMENT  08/06/2016   "2 stents"  . CORONARY ARTERY BYPASS GRAFT N/A 05/17/2018   Procedure:  CORONARY ARTERY BYPASS GRAFTING (CABG) X 2 WITH ENDOSCOPIC HARVESTING OF RIGHT SAPHENOUS VEIN. LIMA TO LAD. SVG TO RCA;  Surgeon: Grace Isaac, MD;  Location: [place];  Service: Open Heart Surgery;  Laterality: N/A;  . HYSTEROSCOPY DIAGNOSTIC  1990s  . JOINT REPLACEMENT    . KNEE ARTHROSCOPY Left 1996  . NASAL SINUS SURGERY  1977  . RIGHT/LEFT HEART CATH AND CORONARY ANGIOGRAPHY N/A 04/01/2018   Procedure: RIGHT/LEFT HEART CATH AND CORONARY ANGIOGRAPHY;  Surgeon: Belva Crome, MD;  Location: [place] CV LAB;  Service: Cardiovascular;  Laterality: N/A;  . TEE WITHOUT CARDIOVERSION N/A 05/17/2018   Procedure: TRANSESOPHAGEAL ECHOCARDIOGRAM (TEE);  Surgeon: Grace Isaac, MD;  Location: [place];  Service: Open Heart Surgery;  Laterality: N/A;  . TOTAL HIP ARTHROPLASTY Left 04/23/2017   Procedure: LEFT TOTAL HIP ARTHROPLASTY ANTERIOR APPROACH;  Surgeon: Mcarthur Rossetti, MD;  Location: [facility];  Service: Orthopedics;  Laterality: Left;  . WEDGE RESECTION Left 05/17/2018   Procedure: LEFT UPPER LOBE LUNG WEDGE RESECTION;  Surgeon: Grace Isaac, MD;  Location: [place];  Service: Open Heart Surgery;  Laterality: Left;     Current Outpatient Medications  Medication Sig Dispense Refill  . acetaminophen (TYLENOL) 500 MG tablet Take 500-1,000 mg by mouth 2 (two) times daily as needed for moderate pain.    . carvedilol (COREG) 6.25 MG tablet Take 1 tablet (6.25 mg total) by mouth 2 (two) times daily with a meal. 180 tablet 3  . cetirizine (ZYRTEC) 10 MG tablet Take 10 mg by mouth daily.    . famotidine (PEPCID) 20 MG tablet Take 20 mg by mouth at bedtime.    . fluticasone (FLONASE) 50 MCG/ACT nasal spray Place 1 spray into both nostrils daily as needed for allergies or rhinitis.    Marland Kitchen glipiZIDE (GLUCOTROL XL) 10 MG 24 hr tablet Take 10 mg by mouth daily with breakfast.     . Menthol-Methyl Salicylate (MUSCLE RUB EX) Apply 1 application topically as needed (for pain).    . metFORMIN  (GLUCOPHAGE-XR) 750 MG 24 hr tablet Take 1,500 mg by mouth every evening.    . prasugrel (EFFIENT) 10 MG TABS tablet Take 1 tablet (10 mg total) by mouth daily. 90 tablet 2  . rosuvastatin (CRESTOR) 10 MG tablet Take 1 tablet (10 mg total) by mouth daily. 90 tablet 3  . sertraline (ZOLOFT) 50 MG tablet Take 25 mg by mouth daily.     . TRULICITY 4.12 IN/8.6VE SOPN Inject 0.75 mg into the skin once a week.   3   No current facility-administered medications for this visit.     Allergies:   Brilinta [ticagrelor]; Lisinopril; Asa [aspirin]; Blue dyes (parenteral); Corn-containing products; Nsaids; Sugar-protein-starch; Tea; Isosulfan blue; and Other    Social History:  The patient  reports that she quit smoking about 37 years ago. Her smoking use included cigarettes. She has a 10.00 pack-year smoking  history. She has never used smokeless tobacco. She reports that she drinks about 7.0 standard drinks of alcohol per week. She reports that she does not use drugs.   Family History:  The patient's family history includes Heart attack in her father and mother; Sudden death in her father.    ROS:  General:no colds or fevers, no weight changes Skin:no rashes or ulcers HEENT:no blurred vision, no congestion CV:see HPI PUL:see HPI Neuro:no syncope, no lightheadedness since stopping losartan Endo:+ diabetes, no thyroid disease  Wt Readings from Last 3 Encounters:  07/25/18 197 lb 9.6 oz (89.6 kg)  07/12/18 198 lb (89.8 kg)  07/05/18 198 lb 10.2 oz (90.1 kg)     PHYSICAL EXAM: VS:  BP 100/62   Pulse 97   Ht 5\' 2"  (1.575 m)   Wt 197 lb 9.6 oz (89.6 kg)   SpO2 98%   BMI 36.14 kg/m  , BMI Body mass index is 36.14 kg/m. General:Pleasant affect, NAD Skin:Warm and dry, brisk capillary refill HEENT:normocephalic, sclera clear, mucus membranes moist Neck:supple, no JVD, no bruits  Heart:S1S2 RRR without murmur, gallup, rub or click Lungs:clear without rales, rhonchi, or wheezes OZD:GUYQ, non  tender, + BS, do not palpate liver spleen or masses Ext:no lower ext edema, 2+ pedal pulses, 2+ radial pulses Neuro:alert and oriented, MAE, follows commands, + facial symmetry    EKG:  EKG is NOT ordered today.    Recent Labs: 05/18/2018: Magnesium 2.3 07/12/2018: ALT 10; BUN 10; Creatinine 1.03; Hemoglobin 10.9; Platelet Count 255; Potassium 4.1; Sodium 139    Lipid Panel    Component Value Date/Time   CHOL 153 05/11/2017 0815   TRIG 281 (H) 05/11/2017 0815   HDL 29 (L) 05/11/2017 0815   CHOLHDL 5.3 (H) 05/11/2017 0815   CHOLHDL 4.8 12/01/2014 0425   VLDL 24 12/01/2014 0425   LDLCALC 68 05/11/2017 0815       Other studies Reviewed: Additional studies/ records that were reviewed today include: . POSTOPERATIVE DIAGNOSES: 1.  Severe aortic stenosis with a trileaflet aortic valve and coronary occlusive disease. 2.  Aortic stenosis with a trileaflet aortic valve 3.  Coronary occlusive disease. 4.  Left upper lobe adenocarcinoma of the lung.  PROCEDURES PERFORMED:   1.  Aortic valve replacement with pericardial tissue valve, [employer] model 3300 TFX 21 mm, serial number [id number]. 2.  Coronary artery bypass grafting x2 with the left internal mammary to the left anterior descending coronary artery and reverse saphenous vein graft to the distal right coronary artery with right thigh endovein harvesting.   3.  Wedge resection, left upper lobe lung lesion.   Echo 06/16/18  Study Conclusions  - Procedure narrative: Transthoracic echocardiography. Image   quality was poor. The study was technically difficult.   Intravenous contrast (Definity) was administered to opacify the   LV. - Left ventricle: The cavity size was normal. Wall thickness was   normal. Systolic function was normal. The estimated ejection   fraction was in the range of 55% to 60%. Wall motion was normal;   there were no regional wall motion abnormalities. Left   ventricular diastolic function  parameters were normal. - Ventricular septum: Septal motion showed paradox. These changes   are consistent with a post-thoracotomy state. - Aortic valve: A bioprosthesis was present and functioning   normally. - Left atrium: The atrium was mildly dilated. - Atrial septum: No defect or patent foramen ovale was identified.   ASSESSMENT AND PLAN:  1.  Hypotension - symptomatic with  dizziness.  Stopping losartan and symptoms resolved.  While she would benefit from ARB with diabetes she is not able to take currently.  - would give it at least another month.  Then try again.  BP borderline today   2.  Recent CABG X 2, AVR with pericardial tissue valve, and Lt upper lung wedge resection for cancer.  She is still processing all the surgery - she is going to cardiac rehab.    3.  Diabetes per PCP.     Current medicines are reviewed with the patient today.  The patient Has no concerns regarding medicines.  The following changes have been made:  See above Labs/ tests ordered today include:see above  Disposition:   FU:  see above  Signed, Cecilie Kicks, NP  07/26/2018 10:37 PM    [facility] [address], [place], [address] [place], [place] Phone: [phone]; Fax: [phone]

## 2018-07-25 ENCOUNTER — Encounter (HOSPITAL_COMMUNITY)
Admission: RE | Admit: 2018-07-25 | Discharge: 2018-07-25 | Disposition: A | Discharge status: Home / Self Care | Payer: 59 | Source: Ambulatory Visit | Attending: Interventional Cardiology | Admitting: Interventional Cardiology

## 2018-07-25 ENCOUNTER — Ambulatory Visit (INDEPENDENT_AMBULATORY_CARE_PROVIDER_SITE_OTHER): Payer: 59 | Admitting: Cardiology

## 2018-07-25 ENCOUNTER — Encounter: Payer: Self-pay | Admitting: Cardiology

## 2018-07-25 ENCOUNTER — Encounter (HOSPITAL_COMMUNITY): Payer: 59

## 2018-07-25 VITALS — BP 100/62 | HR 97 | Ht 62.0 in | Wt 197.6 lb

## 2018-07-25 DIAGNOSIS — I952 Hypotension due to drugs: Secondary | ICD-10-CM

## 2018-07-25 DIAGNOSIS — Z951 Presence of aortocoronary bypass graft: Secondary | ICD-10-CM

## 2018-07-25 DIAGNOSIS — I252 Old myocardial infarction: Secondary | ICD-10-CM | POA: Diagnosis not present

## 2018-07-25 DIAGNOSIS — E119 Type 2 diabetes mellitus without complications: Secondary | ICD-10-CM | POA: Diagnosis not present

## 2018-07-25 DIAGNOSIS — E785 Hyperlipidemia, unspecified: Secondary | ICD-10-CM | POA: Diagnosis not present

## 2018-07-25 DIAGNOSIS — C3492 Malignant neoplasm of unspecified part of left bronchus or lung: Secondary | ICD-10-CM

## 2018-07-25 DIAGNOSIS — F329 Major depressive disorder, single episode, unspecified: Secondary | ICD-10-CM | POA: Diagnosis not present

## 2018-07-25 DIAGNOSIS — I2510 Atherosclerotic heart disease of native coronary artery without angina pectoris: Secondary | ICD-10-CM | POA: Diagnosis not present

## 2018-07-25 DIAGNOSIS — Z952 Presence of prosthetic heart valve: Secondary | ICD-10-CM

## 2018-07-25 DIAGNOSIS — Z902 Acquired absence of lung [part of]: Secondary | ICD-10-CM | POA: Diagnosis not present

## 2018-07-25 DIAGNOSIS — Z79899 Other long term (current) drug therapy: Secondary | ICD-10-CM | POA: Diagnosis not present

## 2018-07-25 NOTE — Patient Instructions (Addendum)
Medication Instructions:  Your physician recommends that you continue on your current medications as directed. Please refer to the Current Medication list given to you today.  If you need a refill on your cardiac medications before your next appointment, please call your pharmacy.   Lab work: None  If you have labs (blood work) drawn today and your tests are completely normal, you will receive your results only by: Marland Kitchen MyChart Message (if you have MyChart) OR . A paper copy in the mail If you have any lab test that is abnormal or we need to change your treatment, we will call you to review the results.  Testing/Procedures: None  Follow-Up: Keep your follow up appointment with Dr. Tamala Julian on 11/22/2018 @ 11:00 AM  Follow up with Truitt Merle NP, in 4-6 weeks   Any Other Special Instructions Will Be Listed Below (If Applicable).

## 2018-07-25 NOTE — Progress Notes (Signed)
Reviewed home exercise guidelines with patient including endpoints, temperature precautions, target heart rate and rate of perceived exertion. Pt is walking as her mode of home exercise. Pt voices understanding of instructions given. Abrish Erny M Shanikqua Zarzycki, MS, ACSM CEP   

## 2018-07-26 ENCOUNTER — Encounter: Payer: Self-pay | Admitting: Cardiology

## 2018-07-27 ENCOUNTER — Encounter (HOSPITAL_COMMUNITY): Payer: 59

## 2018-07-28 NOTE — Progress Notes (Signed)
Cardiac Individual Treatment Plan  Patient Details  Name: Sarah Stevenson MRN: [id number] Date of Birth: 21-Nov-1955 Referring Provider:     CARDIAC REHAB PHASE II ORIENTATION from 07/05/2018 in [place]  Referring Provider  Dr. Daneen Schick      Initial Encounter Date:    CARDIAC REHAB PHASE II ORIENTATION from 07/05/2018 in [facility]  Date  07/05/18      Visit Diagnosis: 05/17/18 CABG x2  05/17/18 AVR (tissue)  Patient's Home Medications on Admission:  Current Outpatient Medications:  .  acetaminophen (TYLENOL) 500 MG tablet, Take 500-1,000 mg by mouth 2 (two) times daily as needed for moderate pain., Disp: , Rfl:  .  carvedilol (COREG) 6.25 MG tablet, Take 1 tablet (6.25 mg total) by mouth 2 (two) times daily with a meal., Disp: 180 tablet, Rfl: 3 .  cetirizine (ZYRTEC) 10 MG tablet, Take 10 mg by mouth daily., Disp: , Rfl:  .  famotidine (PEPCID) 20 MG tablet, Take 20 mg by mouth at bedtime., Disp: , Rfl:  .  fluticasone (FLONASE) 50 MCG/ACT nasal spray, Place 1 spray into both nostrils daily as needed for allergies or rhinitis., Disp: , Rfl:  .  glipiZIDE (GLUCOTROL XL) 10 MG 24 hr tablet, Take 10 mg by mouth daily with breakfast. , Disp: , Rfl:  .  Menthol-Methyl Salicylate (MUSCLE RUB EX), Apply 1 application topically as needed (for pain)., Disp: , Rfl:  .  metFORMIN (GLUCOPHAGE-XR) 750 MG 24 hr tablet, Take 1,500 mg by mouth every evening., Disp: , Rfl:  .  prasugrel (EFFIENT) 10 MG TABS tablet, Take 1 tablet (10 mg total) by mouth daily., Disp: 90 tablet, Rfl: 2 .  rosuvastatin (CRESTOR) 10 MG tablet, Take 1 tablet (10 mg total) by mouth daily., Disp: 90 tablet, Rfl: 3 .  sertraline (ZOLOFT) 50 MG tablet, Take 25 mg by mouth daily. , Disp: , Rfl:  .  TRULICITY 1.01 BP/1.0CH SOPN, Inject 0.75 mg into the skin once a week. , Disp: , Rfl: 3  Past Medical History: Past Medical History:  Diagnosis Date  .  Anginal pain ([place])   . Aortic stenosis   . Coronary artery disease   . Depression    "mild" (08/06/2016)  . Dyspnea   . Hyperlipidemia   . Murmur, cardiac   . Myocardial infarction ([place]) 2016  . Osteoarthritis    "left hip and knee" (08/06/2016)  . Type II diabetes mellitus ([facility])     Tobacco Use: Social History   Tobacco Use  Smoking Status Former Smoker  . Packs/day: 1.00  . Years: 10.00  . Pack years: 10.00  . Types: Cigarettes  . Last attempt to quit: 1982  . Years since quitting: 37.9  Smokeless Tobacco Never Used  Tobacco Comment   restarted  about 5-6 yrs ago, then stopped    Labs: Recent Review Flowsheet Data    Labs for ITP Cardiac and Pulmonary Rehab Latest Ref Rng & Units 05/17/2018 05/17/2018 05/17/2018 05/17/2018 05/18/2018   Cholestrol 100 - 199 mg/dL - - - - -   LDLCALC 0 - 99 mg/dL - - - - -   HDL >39 mg/dL - - - - -   Trlycerides 0 - 149 mg/dL - - - - -   Hemoglobin A1c 4.8 - 5.6 % - - - - -   PHART 7.350 - 7.450 7.343(L) 7.343(L) 7.317(L) - -   PCO2ART 32.0 - 48.0 mmHg 44.2 38.7 41.9 - -  HCO3 20.0 - 28.0 mmol/L 24.1 21.0 21.3 - -   TCO2 22 - 32 mmol/L 25 22 22  21(L) 20(L)   ACIDBASEDEF 0.0 - 2.0 mmol/L 2.0 4.0(H) 4.0(H) - -   O2SAT % 98.0 96.0 97.0 - -      Capillary Blood Glucose: Lab Results  Component Value Date   GLUCAP 150 (H) 07/13/2018   GLUCAP 124 (H) 07/11/2018   GLUCAP 155 (H) 07/11/2018   GLUCAP 128 (H) 06/29/2018   GLUCAP 154 (H) 05/24/2018     Exercise Target Goals: Exercise Program Goal: Individual exercise prescription set using results from initial 6 min walk test and THRR while considering  patient's activity barriers and safety.   Exercise Prescription Goal: Initial exercise prescription builds to 30-45 minutes a day of aerobic activity, 2-3 days per week.  Home exercise guidelines will be given to patient during program as part of exercise prescription that the participant will acknowledge.  Activity Barriers & Risk  Stratification: Activity Barriers & Cardiac Risk Stratification - 07/05/18 1344      Activity Barriers & Cardiac Risk Stratification   Activity Barriers  Left Hip Replacement;Other (comment);Deconditioning    Comments  Left knee arthroscopy, dizziness    Cardiac Risk Stratification  High       6 Minute Walk: 6 Minute Walk    Row Name 07/05/18 1342         6 Minute Walk   Phase  Initial     Distance  1478 feet     Walk Time  6 minutes     # of Rest Breaks  0     MPH  2.8     METS  3.31     RPE  12     VO2 Peak  11.58     Symptoms  No     Resting HR  78 bpm     Resting BP  118/68     Resting Oxygen Saturation   95 %     Exercise Oxygen Saturation  during 6 min walk  97 %     Max Ex. HR  111 bpm     Max Ex. BP  142/72     2 Minute Post BP  104/72        Oxygen Initial Assessment:   Oxygen Re-Evaluation:   Oxygen Discharge (Final Oxygen Re-Evaluation):   Initial Exercise Prescription: Initial Exercise Prescription - 07/05/18 1400      Date of Initial Exercise RX and Referring Provider   Date  07/05/18    Referring Provider  Dr. Daneen Schick    Expected Discharge Date  10/14/18      Recumbant Bike   Level  2    Watts  35    Minutes  10    METs  3.2      NuStep   Level  3    SPM  75    Minutes  10    METs  3.2      Track   Laps  13    Minutes  10    METs  3.1      Prescription Details   Frequency (times per week)  3    Duration  Progress to 30 minutes of continuous aerobic without signs/symptoms of physical distress      Intensity   THRR 40-80% of Max Heartrate  63-126    Ratings of Perceived Exertion  11-13      Progression   Progression  Continue  to progress workloads to maintain intensity without signs/symptoms of physical distress.      Resistance Training   Training Prescription  Yes    Weight  2    Reps  10-15       Perform Capillary Blood Glucose checks as needed.  Exercise Prescription Changes: Exercise Prescription Changes     Row Name 07/11/18 0955 07/25/18 0953           Response to Exercise   Blood Pressure (Admit)  122/64  104/62      Blood Pressure (Exercise)  144/74  152/60      Blood Pressure (Exit)  102/62  102/74      Heart Rate (Admit)  88 bpm  87 bpm      Heart Rate (Exercise)  130 bpm  122 bpm      Heart Rate (Exit)  84 bpm  97 bpm      Rating of Perceived Exertion (Exercise)  13  11      Symptoms  none  none      Duration  Progress to 30 minutes of  aerobic without signs/symptoms of physical distress  Progress to 30 minutes of  aerobic without signs/symptoms of physical distress      Intensity  THRR unchanged  THRR unchanged        Progression   Progression  Continue to progress workloads to maintain intensity without signs/symptoms of physical distress.  Continue to progress workloads to maintain intensity without signs/symptoms of physical distress.      Average METs  3.2  3        Resistance Training   Training Prescription  Yes  Yes      Weight  2  2      Reps  10-15  10-15      Time  10 Minutes  10 Minutes        Interval Training   Interval Training  No  No        Recumbant Bike   Level  2  2      Watts  33  -      Minutes  10  10      METs  3.14  2.4        NuStep   Level  3  3      SPM  75  75      Minutes  10  10      METs  3.4  3.1        Track   Laps  12  14      Minutes  10  10      METs  3.09  3.44        Home Exercise Plan   Plans to continue exercise at  -  Home (comment) walking      Frequency  -  Add 3 additional days to program exercise sessions.      Initial Home Exercises Provided  -  07/25/18         Exercise Comments: Exercise Comments    Row Name 07/25/18 1013           Exercise Comments  Reviewed home exercise guidelines, METs, and goals with patient.          Exercise Goals and Review: Exercise Goals    Row Name 07/05/18 1346             Exercise Goals   Increase Physical Activity  Yes  Intervention  Provide advice,  education, support and counseling about physical activity/exercise needs.;Develop an individualized exercise prescription for aerobic and resistive training based on initial evaluation findings, risk stratification, comorbidities and participant's personal goals.       Expected Outcomes  Short Term: Attend rehab on a regular basis to increase amount of physical activity.       Increase Strength and Stamina  Yes       Intervention  Provide advice, education, support and counseling about physical activity/exercise needs.;Develop an individualized exercise prescription for aerobic and resistive training based on initial evaluation findings, risk stratification, comorbidities and participant's personal goals.       Expected Outcomes  Short Term: Increase workloads from initial exercise prescription for resistance, speed, and METs.       Able to understand and use rate of perceived exertion (RPE) scale  Yes       Intervention  Provide education and explanation on how to use RPE scale       Expected Outcomes  Short Term: Able to use RPE daily in rehab to express subjective intensity level;Long Term:  Able to use RPE to guide intensity level when exercising independently       Knowledge and understanding of Target Heart Rate Range (THRR)  Yes       Intervention  Provide education and explanation of THRR including how the numbers were predicted and where they are located for reference       Expected Outcomes  Short Term: Able to state/look up THRR;Long Term: Able to use THRR to govern intensity when exercising independently;Short Term: Able to use daily as guideline for intensity in rehab       Able to check pulse independently  Yes       Intervention  Provide education and demonstration on how to check pulse in carotid and radial arteries.;Review the importance of being able to check your own pulse for safety during independent exercise       Expected Outcomes  Short Term: Able to explain why pulse checking is  important during independent exercise;Long Term: Able to check pulse independently and accurately       Understanding of Exercise Prescription  Yes       Intervention  Provide education, explanation, and written materials on patient's individual exercise prescription       Expected Outcomes  Short Term: Able to explain program exercise prescription;Long Term: Able to explain home exercise prescription to exercise independently          Exercise Goals Re-Evaluation : Exercise Goals Re-Evaluation    Row Name 07/25/18 1013             Exercise Goal Re-Evaluation   Exercise Goals Review  Able to understand and use Dyspnea scale;Understanding of Exercise Prescription;Knowledge and understanding of Target Heart Rate Range (THRR);Able to understand and use rate of perceived exertion (RPE) scale       Comments  Reviewed home exercise guidelines including THRR, RPE scale and endpoints for exercise. Pt is walking as her mode of home exercise, achieving ~12,000 steps.       Expected Outcomes  Patient will walk 3 days/week in addition to exercise at cardiac rehab to help build a regular exercise routine.          Discharge Exercise Prescription (Final Exercise Prescription Changes): Exercise Prescription Changes - 07/25/18 0953      Response to Exercise   Blood Pressure (Admit)  104/62    Blood Pressure (Exercise)  152/60    Blood Pressure (Exit)  102/74    Heart Rate (Admit)  87 bpm    Heart Rate (Exercise)  122 bpm    Heart Rate (Exit)  97 bpm    Rating of Perceived Exertion (Exercise)  11    Symptoms  none    Duration  Progress to 30 minutes of  aerobic without signs/symptoms of physical distress    Intensity  THRR unchanged      Progression   Progression  Continue to progress workloads to maintain intensity without signs/symptoms of physical distress.    Average METs  3      Resistance Training   Training Prescription  Yes    Weight  2    Reps  10-15    Time  10 Minutes       Interval Training   Interval Training  No      Recumbant Bike   Level  2    Minutes  10    METs  2.4      NuStep   Level  3    SPM  75    Minutes  10    METs  3.1      Track   Laps  14    Minutes  10    METs  3.44      Home Exercise Plan   Plans to continue exercise at  Home (comment)   walking   Frequency  Add 3 additional days to program exercise sessions.    Initial Home Exercises Provided  07/25/18       Nutrition:  Target Goals: Understanding of nutrition guidelines, daily intake of sodium 1500mg , cholesterol 200mg , calories 30% from fat and 7% or less from saturated fats, daily to have 5 or more servings of fruits and vegetables.  Biometrics: Pre Biometrics - 07/05/18 1347      Pre Biometrics   Height  5\' 2"  (1.575 m)    Weight  90.1 kg    Waist Circumference  41.5 inches    Hip Circumference  47.5 inches    Waist to Hip Ratio  0.87 %    BMI (Calculated)  36.32    Triceps Skinfold  26 mm    % Body Fat  45.5 %    Grip Strength  26 kg    Flexibility  16 in    Single Leg Stand  16.84 seconds        Nutrition Therapy Plan and Nutrition Goals: Nutrition Therapy & Goals - 07/05/18 1354      Nutrition Therapy   Diet  heart healthy carb modified      Personal Nutrition Goals   Nutrition Goal  Pt to identify and limit food sources of saturated fat, trans fat, refined carbohydrates and sodium    Personal Goal #2  Pt able to name foods that affect blood glucose.    Personal Goal #3  Pt to identify food quantities necessary to achieve weight loss of 6-24 lbs. at graduation from cardiac rehab. Pt shared ultimate goal weight of 160-165 lbs     Personal Goal #4  Improved blood glucose control as evidenced by pt's A1c trending from 9.3 toward less than 7.0.      Intervention Plan   Intervention  Prescribe, educate and counsel regarding individualized specific dietary modifications aiming towards targeted core components such as weight, hypertension, lipid  management, diabetes, heart failure and other comorbidities.    Expected Outcomes  Short Term Goal: Understand  basic principles of dietary content, such as calories, fat, sodium, cholesterol and nutrients.;Long Term Goal: Adherence to prescribed nutrition plan.       Nutrition Assessments: Nutrition Assessments - 07/05/18 1355      MEDFICTS Scores   Pre Score  36       Nutrition Goals Re-Evaluation: Nutrition Goals Re-Evaluation    [place] Name 07/05/18 1354             Goals   Current Weight  198 lb 10.2 oz (90.1 kg)          Nutrition Goals Re-Evaluation: Nutrition Goals Re-Evaluation    [place] Name 07/05/18 1354             Goals   Current Weight  198 lb 10.2 oz (90.1 kg)          Nutrition Goals Discharge (Final Nutrition Goals Re-Evaluation): Nutrition Goals Re-Evaluation - 07/05/18 1354      Goals   Current Weight  198 lb 10.2 oz (90.1 kg)       Psychosocial: Target Goals: Acknowledge presence or absence of significant depression and/or stress, maximize coping skills, provide positive support system. Participant is able to verbalize types and ability to use techniques and skills needed for reducing stress and depression.  Initial Review & Psychosocial Screening: Initial Psych Review & Screening - 07/05/18 1520      Initial Review   Current issues with  None Identified      Family Dynamics   Good Support System?  Yes   Jaliah has her siblings, dauther, son and law and church family for support     Barriers   Psychosocial barriers to participate in program  There are no identifiable barriers or psychosocial needs.      Screening Interventions   Interventions  Encouraged to exercise       Quality of Life Scores: Quality of Life - 07/05/18 1348      Quality of Life   Select  Quality of Life      Quality of Life Scores   Health/Function Pre  26.4 %    Socioeconomic Pre  28.94 %    Psych/Spiritual Pre  28.29 %    Family Pre  30 %    GLOBAL Pre   27.87 %      Scores of 19 and below usually indicate a poorer quality of life in these areas.  A difference of  2-3 points is a clinically meaningful difference.  A difference of 2-3 points in the total score of the Quality of Life Index has been associated with significant improvement in overall quality of life, self-image, physical symptoms, and general health in studies assessing change in quality of life.  PHQ-9: Recent Review Flowsheet Data    Depression screen [facility] 2/9 07/11/2018   Decreased Interest 0   Down, Depressed, Hopeless 0   PHQ - 2 Score 0     Interpretation of Total Score  Total Score Depression Severity:  1-4 = Minimal depression, 5-9 = Mild depression, 10-14 = Moderate depression, 15-19 = Moderately severe depression, 20-27 = Severe depression   Psychosocial Evaluation and Intervention: Psychosocial Evaluation - 07/11/18 1439      Psychosocial Evaluation & Interventions   Interventions  Encouraged to exercise with the program and follow exercise prescription    Comments  No interventions required. Con Memos enjoys to garden and play with her dog.     Expected Outcomes  Con Memos will maintain a positive outlook.  Continue Psychosocial Services   No Follow up required       Psychosocial Re-Evaluation: Psychosocial Re-Evaluation    [place] Name 07/28/18 1040             Psychosocial Re-Evaluation   Current issues with  None Identified       Interventions  Encouraged to attend Cardiac Rehabilitation for the exercise       Continue Psychosocial Services   No Follow up required          Psychosocial Discharge (Final Psychosocial Re-Evaluation): Psychosocial Re-Evaluation - 07/28/18 1040      Psychosocial Re-Evaluation   Current issues with  None Identified    Interventions  Encouraged to attend Cardiac Rehabilitation for the exercise    Continue Psychosocial Services   No Follow up required       Vocational Rehabilitation: Provide vocational rehab assistance to  qualifying candidates.   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 Participant will state understanding/return demonstration of topics presented.  Learning Barriers/Preferences: Learning Barriers/Preferences - 07/05/18 1349      Learning Barriers/Preferences   Learning Barriers  Sight;Exercise Concerns   Dizziness   Learning Preferences  Written Material;Verbal Instruction;Audio       Education Topics: Count Your Pulse:  -Group instruction provided by verbal instruction, demonstration, patient participation and written materials to support subject.  Instructors address importance of being able to find your pulse and how to count your pulse when at home without a heart monitor.  Patients get hands on experience counting their pulse with staff help and individually.   Heart Attack, Angina, and Risk Factor Modification:  -Group instruction provided by verbal instruction, video, and written materials to support subject.  Instructors address signs and symptoms of angina and heart attacks.    Also discuss risk factors for heart disease and how to make changes to improve heart health risk factors.   Functional Fitness:  -Group instruction provided by verbal instruction, demonstration, patient participation, and written materials to support subject.  Instructors address safety measures for doing things around the house.  Discuss how to get up and down off the floor, how to pick things up properly, how to safely get out of a chair without assistance, and balance training.   Meditation and Mindfulness:  -Group instruction provided by verbal instruction, patient  participation, and written materials to support subject.  Instructor addresses importance of mindfulness and meditation practice to help reduce stress and improve awareness.  Instructor also leads participants through a meditation exercise.    Stretching for Flexibility and Mobility:  -Group instruction provided by verbal instruction, patient participation, and written materials to support subject.  Instructors lead participants through series of stretches that are designed to increase flexibility thus improving mobility.  These stretches are additional exercise for major muscle groups that are typically performed during regular warm up and cool down.   Hands Only CPR:  -Group verbal, video, and participation provides a basic overview of AHA guidelines for community CPR. Role-play of emergencies allow participants the opportunity to practice calling for help and chest compression technique with discussion of AED use.   Hypertension: -Group verbal and written  instruction that provides a basic overview of hypertension including the most recent diagnostic guidelines, risk factor reduction with self-care instructions and medication management.    Nutrition I class: Heart Healthy Eating:  -Group instruction provided by PowerPoint slides, verbal discussion, and written materials to support subject matter. The instructor gives an explanation and review of the Therapeutic Lifestyle Changes diet recommendations, which includes a discussion on lipid goals, dietary fat, sodium, fiber, plant stanol/sterol esters, sugar, and the components of a well-balanced, healthy diet.   Nutrition II class: Lifestyle Skills:  -Group instruction provided by PowerPoint slides, verbal discussion, and written materials to support subject matter. The instructor gives an explanation and review of label reading, grocery shopping for heart health, heart healthy recipe modifications, and ways to make healthier choices when eating  out.   Diabetes Question & Answer:  -Group instruction provided by PowerPoint slides, verbal discussion, and written materials to support subject matter. The instructor gives an explanation and review of diabetes co-morbidities, pre- and post-prandial blood glucose goals, pre-exercise blood glucose goals, signs, symptoms, and treatment of hypoglycemia and hyperglycemia, and foot care basics.   Diabetes Blitz:  -Group instruction provided by PowerPoint slides, verbal discussion, and written materials to support subject matter. The instructor gives an explanation and review of the physiology behind type 1 and type 2 diabetes, diabetes medications and rational behind using different medications, pre- and post-prandial blood glucose recommendations and Hemoglobin A1c goals, diabetes diet, and exercise including blood glucose guidelines for exercising safely.    Portion Distortion:  -Group instruction provided by PowerPoint slides, verbal discussion, written materials, and food models to support subject matter. The instructor gives an explanation of serving size versus portion size, changes in portions sizes over the last 20 years, and what consists of a serving from each food group.   Stress Management:  -Group instruction provided by verbal instruction, video, and written materials to support subject matter.  Instructors review role of stress in heart disease and how to cope with stress positively.     CARDIAC REHAB PHASE II EXERCISE from 07/13/2018 in [place]  Date  07/13/18  Instruction Review Code  2- Demonstrated Understanding      Exercising on Your Own:  -Group instruction provided by verbal instruction, power point, and written materials to support subject.  Instructors discuss benefits of exercise, components of exercise, frequency and intensity of exercise, and end points for exercise.  Also discuss use of nitroglycerin and activating EMS.  Review  options of places to exercise outside of rehab.  Review guidelines for sex with heart disease.   Cardiac Drugs I:  -Group instruction provided by verbal instruction and written materials to support subject.  Instructor reviews cardiac drug classes: antiplatelets, anticoagulants, beta blockers, and statins.  Instructor discusses reasons, side effects, and lifestyle considerations for each drug class.   Cardiac Drugs II:  -Group instruction provided by verbal instruction and written materials to support subject.  Instructor reviews cardiac drug classes: angiotensin converting enzyme inhibitors (ACE-I), angiotensin II receptor blockers (ARBs), nitrates, and calcium channel blockers.  Instructor discusses reasons, side effects, and lifestyle considerations for each drug class.   Anatomy and Physiology of the Circulatory System:  Group verbal and written instruction and models provide basic cardiac anatomy and physiology, with the coronary electrical and arterial systems. Review of: AMI, Angina, Valve disease, Heart Failure, Peripheral Artery Disease, Cardiac Arrhythmia, Pacemakers, and the ICD.   Other Education:  -Group or individual verbal, written, or video  instructions that support the educational goals of the cardiac rehab program.   Holiday Eating Survival Tips:  -Group instruction provided by PowerPoint slides, verbal discussion, and written materials to support subject matter. The instructor gives patients tips, tricks, and techniques to help them not only survive but enjoy the holidays despite the onslaught of food that accompanies the holidays.   Knowledge Questionnaire Score: Knowledge Questionnaire Score - 07/05/18 1350      Knowledge Questionnaire Score   Pre Score  21/24       Core Components/Risk Factors/Patient Goals at Admission: Personal Goals and Risk Factors at Admission - 07/05/18 1350      Core Components/Risk Factors/Patient Goals on Admission    Weight  Management  Yes;Obesity;Weight Maintenance;Weight Loss    Intervention  Weight Management: Develop a combined nutrition and exercise program designed to reach desired caloric intake, while maintaining appropriate intake of nutrient and fiber, sodium and fats, and appropriate energy expenditure required for the weight goal.;Weight Management: Provide education and appropriate resources to help participant work on and attain dietary goals.;Weight Management/Obesity: Establish reasonable short term and long term weight goals.;Obesity: Provide education and appropriate resources to help participant work on and attain dietary goals.    Admit Weight  198 lb 10.2 oz (90.1 kg)    Expected Outcomes  Short Term: Continue to assess and modify interventions until short term weight is achieved;Long Term: Adherence to nutrition and physical activity/exercise program aimed toward attainment of established weight goal;Weight Maintenance: Understanding of the daily nutrition guidelines, which includes 25-35% calories from fat, 7% or less cal from saturated fats, less than 200mg  cholesterol, less than 1.5gm of sodium, & 5 or more servings of fruits and vegetables daily;Weight Loss: Understanding of general recommendations for a balanced deficit meal plan, which promotes 1-2 lb weight loss per week and includes a negative energy balance of [phone] kcal/d;Understanding recommendations for meals to include 15-35% energy as protein, 25-35% energy from fat, 35-60% energy from carbohydrates, less than 200mg  of dietary cholesterol, 20-35 gm of total fiber daily;Understanding of distribution of calorie intake throughout the day with the consumption of 4-5 meals/snacks    Diabetes  Yes    Intervention  Provide education about signs/symptoms and action to take for hypo/hyperglycemia.;Provide education about proper nutrition, including hydration, and aerobic/resistive exercise prescription along with prescribed medications to achieve  blood glucose in normal ranges: Fasting glucose 65-99 mg/dL    Expected Outcomes  Short Term: Participant verbalizes understanding of the signs/symptoms and immediate care of hyper/hypoglycemia, proper foot care and importance of medication, aerobic/resistive exercise and nutrition plan for blood glucose control.;Long Term: Attainment of HbA1C < 7%.    Hypertension  Yes    Intervention  Provide education on lifestyle modifcations including regular physical activity/exercise, weight management, moderate sodium restriction and increased consumption of fresh fruit, vegetables, and low fat dairy, alcohol moderation, and smoking cessation.;Monitor prescription use compliance.    Expected Outcomes  Short Term: Continued assessment and intervention until BP is < 140/82mm HG in hypertensive participants. < 130/31mm HG in hypertensive participants with diabetes, heart failure or chronic kidney disease.;Long Term: Maintenance of blood pressure at goal levels.    Lipids  Yes    Intervention  Provide education and support for participant on nutrition & aerobic/resistive exercise along with prescribed medications to achieve LDL 70mg , HDL >40mg .    Expected Outcomes  Short Term: Participant states understanding of desired cholesterol values and is compliant with medications prescribed. Participant is following exercise prescription and nutrition guidelines.;Long  Term: Cholesterol controlled with medications as prescribed, with individualized exercise RX and with personalized nutrition plan. Value goals: LDL < 70mg , HDL > 40 mg.       Core Components/Risk Factors/Patient Goals Review:  Goals and Risk Factor Review    Row Name 07/11/18 1447 07/28/18 1041           Core Components/Risk Factors/Patient Goals Review   Personal Goals Review  Lipids;Diabetes;Hypertension;Weight Management/Obesity  Lipids;Diabetes;Hypertension;Weight Management/Obesity      Review  Pt willing to participate in CR exercise.  Con Memos  would like to lose weight.  Teri's vital signs have been stable. Con Memos did complain of feeling lightheaded last week this has resolved since her losartan was discontinued      Expected Outcomes  Pt will continue to participate in exercise, nutrition, and lifestyle modification opportunities.   Pt will continue to participate in exercise, nutrition, and lifestyle modification opportunities.          Core Components/Risk Factors/Patient Goals at Discharge (Final Review):  Goals and Risk Factor Review - 07/28/18 1041      Core Components/Risk Factors/Patient Goals Review   Personal Goals Review  Lipids;Diabetes;Hypertension;Weight Management/Obesity    Review  Teri's vital signs have been stable. Con Memos did complain of feeling lightheaded last week this has resolved since her losartan was discontinued    Expected Outcomes  Pt will continue to participate in exercise, nutrition, and lifestyle modification opportunities.        ITP Comments: ITP Comments    Row Name 07/05/18 0900 07/11/18 1215 07/28/18 1039       ITP Comments  Medical Director- Dr. Fransico Him, MD  Pt started exercise today and tolerated it well.   30 Day ITP Review. Con Memos is off to a good start to exercise        Comments: See ITP comments.Barnet Pall, RN,BSN 07/28/2018 10:44 AM

## 2018-07-29 ENCOUNTER — Encounter (HOSPITAL_COMMUNITY): Payer: 59

## 2018-08-01 ENCOUNTER — Encounter (HOSPITAL_COMMUNITY)
Admission: RE | Admit: 2018-08-01 | Discharge: 2018-08-01 | Disposition: A | Discharge status: Home / Self Care | Payer: 59 | Source: Ambulatory Visit | Attending: Interventional Cardiology | Admitting: Interventional Cardiology

## 2018-08-01 ENCOUNTER — Encounter (HOSPITAL_COMMUNITY): Payer: 59

## 2018-08-01 DIAGNOSIS — E785 Hyperlipidemia, unspecified: Secondary | ICD-10-CM | POA: Diagnosis not present

## 2018-08-01 DIAGNOSIS — Z952 Presence of prosthetic heart valve: Secondary | ICD-10-CM | POA: Diagnosis not present

## 2018-08-01 DIAGNOSIS — F329 Major depressive disorder, single episode, unspecified: Secondary | ICD-10-CM | POA: Diagnosis not present

## 2018-08-01 DIAGNOSIS — I252 Old myocardial infarction: Secondary | ICD-10-CM | POA: Diagnosis not present

## 2018-08-01 DIAGNOSIS — Z902 Acquired absence of lung [part of]: Secondary | ICD-10-CM | POA: Diagnosis not present

## 2018-08-01 DIAGNOSIS — I2510 Atherosclerotic heart disease of native coronary artery without angina pectoris: Secondary | ICD-10-CM | POA: Diagnosis not present

## 2018-08-01 DIAGNOSIS — Z79899 Other long term (current) drug therapy: Secondary | ICD-10-CM | POA: Diagnosis not present

## 2018-08-01 DIAGNOSIS — Z951 Presence of aortocoronary bypass graft: Secondary | ICD-10-CM

## 2018-08-01 DIAGNOSIS — E119 Type 2 diabetes mellitus without complications: Secondary | ICD-10-CM | POA: Diagnosis not present

## 2018-08-02 ENCOUNTER — Telehealth (HOSPITAL_COMMUNITY): Payer: Self-pay

## 2018-08-02 MED FILL — glipiZIDE ER 10 MG TB24: 10 | 90 days supply | Qty: 90 | Fill #3

## 2018-08-02 NOTE — Telephone Encounter (Signed)
[id number]

## 2018-08-03 ENCOUNTER — Encounter (HOSPITAL_COMMUNITY): Payer: 59

## 2018-08-03 ENCOUNTER — Encounter (HOSPITAL_COMMUNITY)
Admission: RE | Admit: 2018-08-03 | Discharge: 2018-08-03 | Disposition: A | Discharge status: Home / Self Care | Payer: 59 | Source: Ambulatory Visit | Attending: Interventional Cardiology | Admitting: Interventional Cardiology

## 2018-08-03 DIAGNOSIS — Z951 Presence of aortocoronary bypass graft: Secondary | ICD-10-CM

## 2018-08-03 DIAGNOSIS — M2550 Pain in unspecified joint: Secondary | ICD-10-CM | POA: Diagnosis not present

## 2018-08-03 DIAGNOSIS — Z79899 Other long term (current) drug therapy: Secondary | ICD-10-CM | POA: Diagnosis not present

## 2018-08-03 DIAGNOSIS — Z952 Presence of prosthetic heart valve: Secondary | ICD-10-CM | POA: Diagnosis not present

## 2018-08-03 DIAGNOSIS — E119 Type 2 diabetes mellitus without complications: Secondary | ICD-10-CM | POA: Diagnosis not present

## 2018-08-03 DIAGNOSIS — E785 Hyperlipidemia, unspecified: Secondary | ICD-10-CM | POA: Diagnosis not present

## 2018-08-03 DIAGNOSIS — I10 Essential (primary) hypertension: Secondary | ICD-10-CM | POA: Diagnosis not present

## 2018-08-03 DIAGNOSIS — I2510 Atherosclerotic heart disease of native coronary artery without angina pectoris: Secondary | ICD-10-CM | POA: Diagnosis not present

## 2018-08-03 DIAGNOSIS — Z902 Acquired absence of lung [part of]: Secondary | ICD-10-CM | POA: Diagnosis not present

## 2018-08-03 DIAGNOSIS — F329 Major depressive disorder, single episode, unspecified: Secondary | ICD-10-CM | POA: Diagnosis not present

## 2018-08-03 DIAGNOSIS — E782 Mixed hyperlipidemia: Secondary | ICD-10-CM | POA: Diagnosis not present

## 2018-08-03 DIAGNOSIS — I252 Old myocardial infarction: Secondary | ICD-10-CM | POA: Diagnosis not present

## 2018-08-05 ENCOUNTER — Encounter (HOSPITAL_COMMUNITY): Payer: 59

## 2018-08-08 ENCOUNTER — Encounter (HOSPITAL_COMMUNITY)
Admission: RE | Admit: 2018-08-08 | Discharge: 2018-08-08 | Disposition: A | Discharge status: Home / Self Care | Payer: 59 | Source: Ambulatory Visit | Attending: Interventional Cardiology | Admitting: Interventional Cardiology

## 2018-08-08 ENCOUNTER — Encounter (HOSPITAL_COMMUNITY): Payer: 59

## 2018-08-08 DIAGNOSIS — Z952 Presence of prosthetic heart valve: Secondary | ICD-10-CM | POA: Insufficient documentation

## 2018-08-08 DIAGNOSIS — Z87891 Personal history of nicotine dependence: Secondary | ICD-10-CM | POA: Diagnosis not present

## 2018-08-08 DIAGNOSIS — F329 Major depressive disorder, single episode, unspecified: Secondary | ICD-10-CM | POA: Insufficient documentation

## 2018-08-08 DIAGNOSIS — Z951 Presence of aortocoronary bypass graft: Secondary | ICD-10-CM | POA: Diagnosis not present

## 2018-08-08 DIAGNOSIS — M1712 Unilateral primary osteoarthritis, left knee: Secondary | ICD-10-CM | POA: Insufficient documentation

## 2018-08-08 DIAGNOSIS — Z902 Acquired absence of lung [part of]: Secondary | ICD-10-CM | POA: Insufficient documentation

## 2018-08-08 DIAGNOSIS — I252 Old myocardial infarction: Secondary | ICD-10-CM | POA: Insufficient documentation

## 2018-08-08 DIAGNOSIS — Z79899 Other long term (current) drug therapy: Secondary | ICD-10-CM | POA: Insufficient documentation

## 2018-08-08 DIAGNOSIS — E785 Hyperlipidemia, unspecified: Secondary | ICD-10-CM | POA: Diagnosis not present

## 2018-08-08 DIAGNOSIS — Z7984 Long term (current) use of oral hypoglycemic drugs: Secondary | ICD-10-CM | POA: Diagnosis not present

## 2018-08-08 DIAGNOSIS — Z96642 Presence of left artificial hip joint: Secondary | ICD-10-CM | POA: Insufficient documentation

## 2018-08-08 DIAGNOSIS — E119 Type 2 diabetes mellitus without complications: Secondary | ICD-10-CM | POA: Diagnosis not present

## 2018-08-08 DIAGNOSIS — M1612 Unilateral primary osteoarthritis, left hip: Secondary | ICD-10-CM | POA: Insufficient documentation

## 2018-08-08 DIAGNOSIS — I2510 Atherosclerotic heart disease of native coronary artery without angina pectoris: Secondary | ICD-10-CM | POA: Diagnosis not present

## 2018-08-09 ENCOUNTER — Ambulatory Visit (HOSPITAL_COMMUNITY): Payer: 59

## 2018-08-10 ENCOUNTER — Encounter (HOSPITAL_COMMUNITY): Payer: 59

## 2018-08-10 ENCOUNTER — Encounter (HOSPITAL_COMMUNITY)
Admission: RE | Admit: 2018-08-10 | Discharge: 2018-08-10 | Disposition: A | Discharge status: Home / Self Care | Payer: 59 | Source: Ambulatory Visit | Attending: Interventional Cardiology | Admitting: Interventional Cardiology

## 2018-08-10 DIAGNOSIS — E782 Mixed hyperlipidemia: Secondary | ICD-10-CM | POA: Diagnosis not present

## 2018-08-10 DIAGNOSIS — Z951 Presence of aortocoronary bypass graft: Secondary | ICD-10-CM | POA: Diagnosis not present

## 2018-08-10 DIAGNOSIS — Z952 Presence of prosthetic heart valve: Secondary | ICD-10-CM

## 2018-08-10 DIAGNOSIS — E119 Type 2 diabetes mellitus without complications: Secondary | ICD-10-CM | POA: Diagnosis not present

## 2018-08-10 DIAGNOSIS — Z902 Acquired absence of lung [part of]: Secondary | ICD-10-CM | POA: Diagnosis not present

## 2018-08-10 DIAGNOSIS — E785 Hyperlipidemia, unspecified: Secondary | ICD-10-CM | POA: Diagnosis not present

## 2018-08-10 DIAGNOSIS — I10 Essential (primary) hypertension: Secondary | ICD-10-CM | POA: Diagnosis not present

## 2018-08-10 DIAGNOSIS — Z0000 Encounter for general adult medical examination without abnormal findings: Secondary | ICD-10-CM | POA: Diagnosis not present

## 2018-08-10 DIAGNOSIS — I252 Old myocardial infarction: Secondary | ICD-10-CM | POA: Diagnosis not present

## 2018-08-10 DIAGNOSIS — F329 Major depressive disorder, single episode, unspecified: Secondary | ICD-10-CM | POA: Diagnosis not present

## 2018-08-10 DIAGNOSIS — Z79899 Other long term (current) drug therapy: Secondary | ICD-10-CM | POA: Diagnosis not present

## 2018-08-10 DIAGNOSIS — I2510 Atherosclerotic heart disease of native coronary artery without angina pectoris: Secondary | ICD-10-CM | POA: Diagnosis not present

## 2018-08-12 ENCOUNTER — Encounter (HOSPITAL_COMMUNITY): Payer: 59

## 2018-08-15 ENCOUNTER — Encounter (HOSPITAL_COMMUNITY): Payer: 59

## 2018-08-15 ENCOUNTER — Encounter (HOSPITAL_COMMUNITY)
Admission: RE | Admit: 2018-08-15 | Discharge: 2018-08-15 | Disposition: A | Discharge status: Home / Self Care | Payer: 59 | Source: Ambulatory Visit | Attending: Interventional Cardiology | Admitting: Interventional Cardiology

## 2018-08-15 DIAGNOSIS — I252 Old myocardial infarction: Secondary | ICD-10-CM | POA: Diagnosis not present

## 2018-08-15 DIAGNOSIS — I2510 Atherosclerotic heart disease of native coronary artery without angina pectoris: Secondary | ICD-10-CM | POA: Diagnosis not present

## 2018-08-15 DIAGNOSIS — Z902 Acquired absence of lung [part of]: Secondary | ICD-10-CM | POA: Diagnosis not present

## 2018-08-15 DIAGNOSIS — Z952 Presence of prosthetic heart valve: Secondary | ICD-10-CM | POA: Diagnosis not present

## 2018-08-15 DIAGNOSIS — E785 Hyperlipidemia, unspecified: Secondary | ICD-10-CM | POA: Diagnosis not present

## 2018-08-15 DIAGNOSIS — E119 Type 2 diabetes mellitus without complications: Secondary | ICD-10-CM | POA: Diagnosis not present

## 2018-08-15 DIAGNOSIS — Z79899 Other long term (current) drug therapy: Secondary | ICD-10-CM | POA: Diagnosis not present

## 2018-08-15 DIAGNOSIS — F329 Major depressive disorder, single episode, unspecified: Secondary | ICD-10-CM | POA: Diagnosis not present

## 2018-08-15 DIAGNOSIS — Z951 Presence of aortocoronary bypass graft: Secondary | ICD-10-CM

## 2018-08-16 NOTE — Progress Notes (Signed)
Cardiac Individual Treatment Plan  Patient Details  Name: Sarah Stevenson MRN: [id number] Date of Birth: 1956/02/26 Referring Provider:     CARDIAC REHAB PHASE II ORIENTATION from 07/05/2018 in [place]  Referring Provider  Dr. Daneen Schick      Initial Encounter Date:    CARDIAC REHAB PHASE II ORIENTATION from 07/05/2018 in [place]  Date  07/05/18      Visit Diagnosis: 05/17/18 CABG x2  05/17/18 AVR (tissue)  Patient's Home Medications on Admission:  Current Outpatient Medications:  .  acetaminophen (TYLENOL) 500 MG tablet, Take 500-1,000 mg by mouth 2 (two) times daily as needed for moderate pain., Disp: , Rfl:  .  carvedilol (COREG) 6.25 MG tablet, Take 1 tablet (6.25 mg total) by mouth 2 (two) times daily with a meal., Disp: 180 tablet, Rfl: 3 .  cetirizine (ZYRTEC) 10 MG tablet, Take 10 mg by mouth daily., Disp: , Rfl:  .  famotidine (PEPCID) 20 MG tablet, Take 20 mg by mouth at bedtime., Disp: , Rfl:  .  fluticasone (FLONASE) 50 MCG/ACT nasal spray, Place 1 spray into both nostrils daily as needed for allergies or rhinitis., Disp: , Rfl:  .  glipiZIDE (GLUCOTROL XL) 10 MG 24 hr tablet, Take 10 mg by mouth daily with breakfast. , Disp: , Rfl:  .  Menthol-Methyl Salicylate (MUSCLE RUB EX), Apply 1 application topically as needed (for pain)., Disp: , Rfl:  .  metFORMIN (GLUCOPHAGE-XR) 750 MG 24 hr tablet, Take 1,500 mg by mouth every evening., Disp: , Rfl:  .  prasugrel (EFFIENT) 10 MG TABS tablet, Take 1 tablet (10 mg total) by mouth daily., Disp: 90 tablet, Rfl: 2 .  rosuvastatin (CRESTOR) 10 MG tablet, Take 1 tablet (10 mg total) by mouth daily., Disp: 90 tablet, Rfl: 3 .  sertraline (ZOLOFT) 50 MG tablet, Take 25 mg by mouth daily. , Disp: , Rfl:  .  TRULICITY 1.69 CV/8.9FY SOPN, Inject 0.75 mg into the skin once a week. , Disp: , Rfl: 3  Past Medical History: Past Medical History:  Diagnosis Date  .  Anginal pain ([place])   . Aortic stenosis   . Coronary artery disease   . Depression    "mild" (08/06/2016)  . Dyspnea   . Hyperlipidemia   . Murmur, cardiac   . Myocardial infarction ([place]) 2016  . Osteoarthritis    "left hip and knee" (08/06/2016)  . Type II diabetes mellitus ([facility])     Tobacco Use: Social History   Tobacco Use  Smoking Status Former Smoker  . Packs/day: 1.00  . Years: 10.00  . Pack years: 10.00  . Types: Cigarettes  . Last attempt to quit: 1982  . Years since quitting: 37.9  Smokeless Tobacco Never Used  Tobacco Comment   restarted  about 5-6 yrs ago, then stopped    Labs: Recent Review Flowsheet Data    Labs for ITP Cardiac and Pulmonary Rehab Latest Ref Rng & Units 05/17/2018 05/17/2018 05/17/2018 05/17/2018 05/18/2018   Cholestrol 100 - 199 mg/dL - - - - -   LDLCALC 0 - 99 mg/dL - - - - -   HDL >39 mg/dL - - - - -   Trlycerides 0 - 149 mg/dL - - - - -   Hemoglobin A1c 4.8 - 5.6 % - - - - -   PHART 7.350 - 7.450 7.343(L) 7.343(L) 7.317(L) - -   PCO2ART 32.0 - 48.0 mmHg 44.2 38.7 41.9 - -  HCO3 20.0 - 28.0 mmol/L 24.1 21.0 21.3 - -   TCO2 22 - 32 mmol/L 25 22 22  21(L) 20(L)   ACIDBASEDEF 0.0 - 2.0 mmol/L 2.0 4.0(H) 4.0(H) - -   O2SAT % 98.0 96.0 97.0 - -      Capillary Blood Glucose: Lab Results  Component Value Date   GLUCAP 150 (H) 07/13/2018   GLUCAP 124 (H) 07/11/2018   GLUCAP 155 (H) 07/11/2018   GLUCAP 128 (H) 06/29/2018   GLUCAP 154 (H) 05/24/2018     Exercise Target Goals: Exercise Program Goal: Individual exercise prescription set using results from initial 6 min walk test and THRR while considering  patient's activity barriers and safety.   Exercise Prescription Goal: Initial exercise prescription builds to 30-45 minutes a day of aerobic activity, 2-3 days per week.  Home exercise guidelines will be given to patient during program as part of exercise prescription that the participant will acknowledge.  Activity Barriers & Risk  Stratification: Activity Barriers & Cardiac Risk Stratification - 07/05/18 1344      Activity Barriers & Cardiac Risk Stratification   Activity Barriers  Left Hip Replacement;Other (comment);Deconditioning    Comments  Left knee arthroscopy, dizziness    Cardiac Risk Stratification  High       6 Minute Walk: 6 Minute Walk    Row Name 07/05/18 1342         6 Minute Walk   Phase  Initial     Distance  1478 feet     Walk Time  6 minutes     # of Rest Breaks  0     MPH  2.8     METS  3.31     RPE  12     VO2 Peak  11.58     Symptoms  No     Resting HR  78 bpm     Resting BP  118/68     Resting Oxygen Saturation   95 %     Exercise Oxygen Saturation  during 6 min walk  97 %     Max Ex. HR  111 bpm     Max Ex. BP  142/72     2 Minute Post BP  104/72        Oxygen Initial Assessment:   Oxygen Re-Evaluation:   Oxygen Discharge (Final Oxygen Re-Evaluation):   Initial Exercise Prescription: Initial Exercise Prescription - 07/05/18 1400      Date of Initial Exercise RX and Referring Provider   Date  07/05/18    Referring Provider  Dr. Daneen Schick    Expected Discharge Date  10/14/18      Recumbant Bike   Level  2    Watts  35    Minutes  10    METs  3.2      NuStep   Level  3    SPM  75    Minutes  10    METs  3.2      Track   Laps  13    Minutes  10    METs  3.1      Prescription Details   Frequency (times per week)  3    Duration  Progress to 30 minutes of continuous aerobic without signs/symptoms of physical distress      Intensity   THRR 40-80% of Max Heartrate  63-126    Ratings of Perceived Exertion  11-13      Progression   Progression  Continue  to progress workloads to maintain intensity without signs/symptoms of physical distress.      Resistance Training   Training Prescription  Yes    Weight  2    Reps  10-15       Perform Capillary Blood Glucose checks as needed.  Exercise Prescription Changes:  Exercise Prescription Changes     Row Name 07/11/18 0955 07/25/18 0953 08/08/18 0956         Response to Exercise   Blood Pressure (Admit)  122/64  104/62  134/60     Blood Pressure (Exercise)  144/74  152/60  128/80     Blood Pressure (Exit)  102/62  102/74  104/60     Heart Rate (Admit)  88 bpm  87 bpm  97 bpm     Heart Rate (Exercise)  130 bpm  122 bpm  132 bpm     Heart Rate (Exit)  84 bpm  97 bpm  97 bpm     Rating of Perceived Exertion (Exercise)  13  11  12      Symptoms  none  none  none     Duration  Progress to 30 minutes of  aerobic without signs/symptoms of physical distress  Progress to 30 minutes of  aerobic without signs/symptoms of physical distress  Progress to 30 minutes of  aerobic without signs/symptoms of physical distress     Intensity  THRR unchanged  THRR unchanged  THRR unchanged       Progression   Progression  Continue to progress workloads to maintain intensity without signs/symptoms of physical distress.  Continue to progress workloads to maintain intensity without signs/symptoms of physical distress.  Continue to progress workloads to maintain intensity without signs/symptoms of physical distress.     Average METs  3.2  3  3        Resistance Training   Training Prescription  Yes  Yes  Yes     Weight  2  2  2      Reps  10-15  10-15  10-15     Time  10 Minutes  10 Minutes  10 Minutes       Interval Training   Interval Training  No  No  No       Recumbant Bike   Level  2  2  2.5     Watts  33  -  -     Minutes  10  10  10      METs  3.14  2.4  2.7       NuStep   Level  3  3  3      SPM  75  75  75     Minutes  10  10  10      METs  3.4  3.1  3.1       Track   Laps  12  14  12      Minutes  10  10  10      METs  3.09  3.44  3.09       Home Exercise Plan   Plans to continue exercise at  -  Home (comment) walking  Home (comment) walking     Frequency  -  Add 3 additional days to program exercise sessions.  Add 3 additional days to program exercise sessions.     Initial Home  Exercises Provided  -  07/25/18  07/25/18        Exercise Comments:  Exercise Comments  [place] Name 07/25/18 1013 08/08/18 1026         Exercise Comments  Reviewed home exercise guidelines, METs, and goals with patient.  Reviewed METs with patient.         Exercise Goals and Review:  Exercise Goals    Row Name 07/05/18 1346             Exercise Goals   Increase Physical Activity  Yes       Intervention  Provide advice, education, support and counseling about physical activity/exercise needs.;Develop an individualized exercise prescription for aerobic and resistive training based on initial evaluation findings, risk stratification, comorbidities and participant's personal goals.       Expected Outcomes  Short Term: Attend rehab on a regular basis to increase amount of physical activity.       Increase Strength and Stamina  Yes       Intervention  Provide advice, education, support and counseling about physical activity/exercise needs.;Develop an individualized exercise prescription for aerobic and resistive training based on initial evaluation findings, risk stratification, comorbidities and participant's personal goals.       Expected Outcomes  Short Term: Increase workloads from initial exercise prescription for resistance, speed, and METs.       Able to understand and use rate of perceived exertion (RPE) scale  Yes       Intervention  Provide education and explanation on how to use RPE scale       Expected Outcomes  Short Term: Able to use RPE daily in rehab to express subjective intensity level;Long Term:  Able to use RPE to guide intensity level when exercising independently       Knowledge and understanding of Target Heart Rate Range (THRR)  Yes       Intervention  Provide education and explanation of THRR including how the numbers were predicted and where they are located for reference       Expected Outcomes  Short Term: Able to state/look up THRR;Long Term: Able to use THRR  to govern intensity when exercising independently;Short Term: Able to use daily as guideline for intensity in rehab       Able to check pulse independently  Yes       Intervention  Provide education and demonstration on how to check pulse in carotid and radial arteries.;Review the importance of being able to check your own pulse for safety during independent exercise       Expected Outcomes  Short Term: Able to explain why pulse checking is important during independent exercise;Long Term: Able to check pulse independently and accurately       Understanding of Exercise Prescription  Yes       Intervention  Provide education, explanation, and written materials on patient's individual exercise prescription       Expected Outcomes  Short Term: Able to explain program exercise prescription;Long Term: Able to explain home exercise prescription to exercise independently          Exercise Goals Re-Evaluation : Exercise Goals Re-Evaluation    Row Name 07/25/18 1013             Exercise Goal Re-Evaluation   Exercise Goals Review  Able to understand and use Dyspnea scale;Understanding of Exercise Prescription;Knowledge and understanding of Target Heart Rate Range (THRR);Able to understand and use rate of perceived exertion (RPE) scale       Comments  Reviewed home exercise guidelines including THRR, RPE scale and endpoints for exercise. Pt is walking as her  mode of home exercise, achieving ~12,000 steps.       Expected Outcomes  Patient will walk 3 days/week in addition to exercise at cardiac rehab to help build a regular exercise routine.          Discharge Exercise Prescription (Final Exercise Prescription Changes): Exercise Prescription Changes - 08/08/18 0956      Response to Exercise   Blood Pressure (Admit)  134/60    Blood Pressure (Exercise)  128/80    Blood Pressure (Exit)  104/60    Heart Rate (Admit)  97 bpm    Heart Rate (Exercise)  132 bpm    Heart Rate (Exit)  97 bpm    Rating  of Perceived Exertion (Exercise)  12    Symptoms  none    Duration  Progress to 30 minutes of  aerobic without signs/symptoms of physical distress    Intensity  THRR unchanged      Progression   Progression  Continue to progress workloads to maintain intensity without signs/symptoms of physical distress.    Average METs  3      Resistance Training   Training Prescription  Yes    Weight  2    Reps  10-15    Time  10 Minutes      Interval Training   Interval Training  No      Recumbant Bike   Level  2.5    Minutes  10    METs  2.7      NuStep   Level  3    SPM  75    Minutes  10    METs  3.1      Track   Laps  12    Minutes  10    METs  3.09      Home Exercise Plan   Plans to continue exercise at  Home (comment)   walking   Frequency  Add 3 additional days to program exercise sessions.    Initial Home Exercises Provided  07/25/18       Nutrition:  Target Goals: Understanding of nutrition guidelines, daily intake of sodium 1500mg , cholesterol 200mg , calories 30% from fat and 7% or less from saturated fats, daily to have 5 or more servings of fruits and vegetables.  Biometrics: Pre Biometrics - 07/05/18 1347      Pre Biometrics   Height  5\' 2"  (1.575 m)    Weight  90.1 kg    Waist Circumference  41.5 inches    Hip Circumference  47.5 inches    Waist to Hip Ratio  0.87 %    BMI (Calculated)  36.32    Triceps Skinfold  26 mm    % Body Fat  45.5 %    Grip Strength  26 kg    Flexibility  16 in    Single Leg Stand  16.84 seconds        Nutrition Therapy Plan and Nutrition Goals: Nutrition Therapy & Goals - 07/05/18 1354      Nutrition Therapy   Diet  heart healthy carb modified      Personal Nutrition Goals   Nutrition Goal  Pt to identify and limit food sources of saturated fat, trans fat, refined carbohydrates and sodium    Personal Goal #2  Pt able to name foods that affect blood glucose.    Personal Goal #3  Pt to identify food quantities  necessary to achieve weight loss of 6-24 lbs. at graduation from cardiac rehab.  Pt shared ultimate goal weight of 160-165 lbs     Personal Goal #4  Improved blood glucose control as evidenced by pt's A1c trending from 9.3 toward less than 7.0.      Intervention Plan   Intervention  Prescribe, educate and counsel regarding individualized specific dietary modifications aiming towards targeted core components such as weight, hypertension, lipid management, diabetes, heart failure and other comorbidities.    Expected Outcomes  Short Term Goal: Understand basic principles of dietary content, such as calories, fat, sodium, cholesterol and nutrients.;Long Term Goal: Adherence to prescribed nutrition plan.       Nutrition Assessments: Nutrition Assessments - 07/05/18 1355      MEDFICTS Scores   Pre Score  36       Nutrition Goals Re-Evaluation: Nutrition Goals Re-Evaluation    [place] Name 07/05/18 1354             Goals   Current Weight  198 lb 10.2 oz (90.1 kg)          Nutrition Goals Re-Evaluation: Nutrition Goals Re-Evaluation    [place] Name 07/05/18 1354             Goals   Current Weight  198 lb 10.2 oz (90.1 kg)          Nutrition Goals Discharge (Final Nutrition Goals Re-Evaluation): Nutrition Goals Re-Evaluation - 07/05/18 1354      Goals   Current Weight  198 lb 10.2 oz (90.1 kg)       Psychosocial: Target Goals: Acknowledge presence or absence of significant depression and/or stress, maximize coping skills, provide positive support system. Participant is able to verbalize types and ability to use techniques and skills needed for reducing stress and depression.  Initial Review & Psychosocial Screening: Initial Psych Review & Screening - 07/05/18 1520      Initial Review   Current issues with  None Identified      Family Dynamics   Good Support System?  Yes   Luciel has her siblings, dauther, son and law and church family for support     Barriers    Psychosocial barriers to participate in program  There are no identifiable barriers or psychosocial needs.      Screening Interventions   Interventions  Encouraged to exercise       Quality of Life Scores: Quality of Life - 07/05/18 1348      Quality of Life   Select  Quality of Life      Quality of Life Scores   Health/Function Pre  26.4 %    Socioeconomic Pre  28.94 %    Psych/Spiritual Pre  28.29 %    Family Pre  30 %    GLOBAL Pre  27.87 %      Scores of 19 and below usually indicate a poorer quality of life in these areas.  A difference of  2-3 points is a clinically meaningful difference.  A difference of 2-3 points in the total score of the Quality of Life Index has been associated with significant improvement in overall quality of life, self-image, physical symptoms, and general health in studies assessing change in quality of life.  PHQ-9: Recent Review Flowsheet Data    Depression screen [facility] 2/9 07/11/2018   Decreased Interest 0   Down, Depressed, Hopeless 0   PHQ - 2 Score 0     Interpretation of Total Score  Total Score Depression Severity:  1-4 = Minimal depression, 5-9 = Mild depression, 10-14 =  Moderate depression, 15-19 = Moderately severe depression, 20-27 = Severe depression   Psychosocial Evaluation and Intervention: Psychosocial Evaluation - 08/16/18 1428      Psychosocial Evaluation & Interventions   Interventions  Encouraged to exercise with the program and follow exercise prescription       Psychosocial Re-Evaluation: Psychosocial Re-Evaluation    [place] Name 07/28/18 1040 08/16/18 1429           Psychosocial Re-Evaluation   Current issues with  None Identified  None Identified      Interventions  Encouraged to attend Cardiac Rehabilitation for the exercise  Encouraged to attend Cardiac Rehabilitation for the exercise      Continue Psychosocial Services   No Follow up required  No Follow up required         Psychosocial Discharge (Final  Psychosocial Re-Evaluation): Psychosocial Re-Evaluation - 08/16/18 1429      Psychosocial Re-Evaluation   Current issues with  None Identified    Interventions  Encouraged to attend Cardiac Rehabilitation for the exercise    Continue Psychosocial Services   No Follow up required       Vocational Rehabilitation: Provide vocational rehab assistance to qualifying candidates.   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 Participant will state understanding/return demonstration of topics presented.  Learning Barriers/Preferences: Learning Barriers/Preferences - 07/05/18 1349      Learning Barriers/Preferences   Learning Barriers  Sight;Exercise Concerns   Dizziness   Learning Preferences  Written Material;Verbal Instruction;Audio       Education Topics: Count Your Pulse:  -Group instruction provided by verbal instruction, demonstration, patient participation and written materials to support subject.  Instructors address importance of being able to find your pulse and how to count your pulse when at home without a heart monitor.  Patients get hands on experience counting their pulse with staff help and individually.   Heart Attack, Angina, and Risk Factor Modification:  -Group instruction provided by verbal instruction, video, and written materials to support subject.  Instructors address signs and symptoms of angina and heart attacks.    Also discuss risk factors for heart disease and how to make changes to improve heart health risk factors.   Functional Fitness:  -Group instruction provided by verbal instruction, demonstration, patient participation, and  written materials to support subject.  Instructors address safety measures for doing things around the house.  Discuss how to get up and down off the floor, how to pick things up properly, how to safely get out of a chair without assistance, and balance training.   Meditation and Mindfulness:  -Group instruction provided by verbal instruction, patient participation, and written materials to support subject.  Instructor addresses importance of mindfulness and meditation practice to help reduce stress and improve awareness.  Instructor also leads participants through a meditation exercise.    Stretching for Flexibility and Mobility:  -Group instruction provided by verbal instruction, patient participation, and written materials to support subject.  Instructors lead participants through series of stretches that are designed to increase flexibility thus improving mobility.  These stretches are additional exercise for major muscle groups that are typically performed during regular warm up and cool  down.   Hands Only CPR:  -Group verbal, video, and participation provides a basic overview of AHA guidelines for community CPR. Role-play of emergencies allow participants the opportunity to practice calling for help and chest compression technique with discussion of AED use.   Hypertension: -Group verbal and written instruction that provides a basic overview of hypertension including the most recent diagnostic guidelines, risk factor reduction with self-care instructions and medication management.    Nutrition I class: Heart Healthy Eating:  -Group instruction provided by PowerPoint slides, verbal discussion, and written materials to support subject matter. The instructor gives an explanation and review of the Therapeutic Lifestyle Changes diet recommendations, which includes a discussion on lipid goals, dietary fat, sodium, fiber, plant stanol/sterol esters, sugar, and the components of a well-balanced,  healthy diet.   Nutrition II class: Lifestyle Skills:  -Group instruction provided by PowerPoint slides, verbal discussion, and written materials to support subject matter. The instructor gives an explanation and review of label reading, grocery shopping for heart health, heart healthy recipe modifications, and ways to make healthier choices when eating out.   Diabetes Question & Answer:  -Group instruction provided by PowerPoint slides, verbal discussion, and written materials to support subject matter. The instructor gives an explanation and review of diabetes co-morbidities, pre- and post-prandial blood glucose goals, pre-exercise blood glucose goals, signs, symptoms, and treatment of hypoglycemia and hyperglycemia, and foot care basics.   Diabetes Blitz:  -Group instruction provided by PowerPoint slides, verbal discussion, and written materials to support subject matter. The instructor gives an explanation and review of the physiology behind type 1 and type 2 diabetes, diabetes medications and rational behind using different medications, pre- and post-prandial blood glucose recommendations and Hemoglobin A1c goals, diabetes diet, and exercise including blood glucose guidelines for exercising safely.    Portion Distortion:  -Group instruction provided by PowerPoint slides, verbal discussion, written materials, and food models to support subject matter. The instructor gives an explanation of serving size versus portion size, changes in portions sizes over the last 20 years, and what consists of a serving from each food group.   Stress Management:  -Group instruction provided by verbal instruction, video, and written materials to support subject matter.  Instructors review role of stress in heart disease and how to cope with stress positively.     CARDIAC REHAB PHASE II EXERCISE from 07/13/2018 in [place]  Date  07/13/18  Instruction Review Code  2-  Demonstrated Understanding      Exercising on Your Own:  -Group instruction provided by verbal instruction, power point, and written materials to support subject.  Instructors discuss benefits of exercise, components of exercise, frequency and intensity of exercise, and end points for exercise.  Also discuss use of nitroglycerin and activating EMS.  Review options of places to exercise outside of rehab.  Review guidelines for sex with heart disease.   Cardiac Drugs I:  -Group instruction provided by verbal instruction and written materials to support subject.  Instructor reviews cardiac drug classes: antiplatelets, anticoagulants, beta blockers, and statins.  Instructor discusses reasons, side effects, and lifestyle considerations for each drug class.   Cardiac Drugs II:  -Group instruction provided by verbal instruction and written materials to support subject.  Instructor reviews cardiac drug classes: angiotensin converting enzyme inhibitors (ACE-I), angiotensin II receptor blockers (ARBs), nitrates, and calcium channel blockers.  Instructor discusses reasons, side effects, and lifestyle considerations for each drug class.   Anatomy and Physiology of the Circulatory System:  Group verbal and written instruction and models provide basic cardiac anatomy and physiology, with the coronary electrical and arterial systems. Review of: AMI, Angina, Valve disease, Heart Failure, Peripheral Artery Disease, Cardiac Arrhythmia, Pacemakers, and the ICD.   Other Education:  -Group or individual verbal, written, or video instructions that support the educational goals of the cardiac rehab program.   Holiday Eating Survival Tips:  -Group instruction provided by PowerPoint slides, verbal discussion, and written materials to support subject matter. The instructor gives patients tips, tricks, and techniques to help them not only survive but enjoy the holidays despite the onslaught of food that accompanies  the holidays.   Knowledge Questionnaire Score: Knowledge Questionnaire Score - 07/05/18 1350      Knowledge Questionnaire Score   Pre Score  21/24       Core Components/Risk Factors/Patient Goals at Admission: Personal Goals and Risk Factors at Admission - 07/05/18 1350      Core Components/Risk Factors/Patient Goals on Admission    Weight Management  Yes;Obesity;Weight Maintenance;Weight Loss    Intervention  Weight Management: Develop a combined nutrition and exercise program designed to reach desired caloric intake, while maintaining appropriate intake of nutrient and fiber, sodium and fats, and appropriate energy expenditure required for the weight goal.;Weight Management: Provide education and appropriate resources to help participant work on and attain dietary goals.;Weight Management/Obesity: Establish reasonable short term and long term weight goals.;Obesity: Provide education and appropriate resources to help participant work on and attain dietary goals.    Admit Weight  198 lb 10.2 oz (90.1 kg)    Expected Outcomes  Short Term: Continue to assess and modify interventions until short term weight is achieved;Long Term: Adherence to nutrition and physical activity/exercise program aimed toward attainment of established weight goal;Weight Maintenance: Understanding of the daily nutrition guidelines, which includes 25-35% calories from fat, 7% or less cal from saturated fats, less than 200mg  cholesterol, less than 1.5gm of sodium, & 5 or more servings of fruits and vegetables daily;Weight Loss: Understanding of general recommendations for a balanced deficit meal plan, which promotes 1-2 lb weight loss per week and includes a negative energy balance of [phone] kcal/d;Understanding recommendations for meals to include 15-35% energy as protein, 25-35% energy from fat, 35-60% energy from carbohydrates, less than 200mg  of dietary cholesterol, 20-35 gm of total fiber daily;Understanding of  distribution of calorie intake throughout the day with the consumption of 4-5 meals/snacks    Diabetes  Yes    Intervention  Provide education about signs/symptoms and action to take for hypo/hyperglycemia.;Provide education about proper nutrition, including hydration, and aerobic/resistive exercise prescription along with prescribed medications to achieve blood glucose in normal ranges: Fasting glucose 65-99 mg/dL    Expected Outcomes  Short Term: Participant verbalizes understanding of the signs/symptoms and immediate care of hyper/hypoglycemia, proper foot care and importance of medication, aerobic/resistive exercise and nutrition plan for blood glucose control.;Long Term: Attainment of HbA1C < 7%.    Hypertension  Yes    Intervention  Provide education on lifestyle modifcations including regular physical activity/exercise, weight management, moderate sodium restriction and increased consumption of fresh fruit, vegetables, and low fat dairy, alcohol moderation, and smoking cessation.;Monitor prescription use compliance.    Expected Outcomes  Short Term: Continued assessment and intervention until BP is < 140/68mm HG in hypertensive participants. < 130/9mm HG in hypertensive participants with diabetes, heart failure or chronic kidney disease.;Long Term: Maintenance of blood pressure at goal levels.    Lipids  Yes    Intervention  Provide  education and support for participant on nutrition & aerobic/resistive exercise along with prescribed medications to achieve LDL 70mg , HDL >40mg .    Expected Outcomes  Short Term: Participant states understanding of desired cholesterol values and is compliant with medications prescribed. Participant is following exercise prescription and nutrition guidelines.;Long Term: Cholesterol controlled with medications as prescribed, with individualized exercise RX and with personalized nutrition plan. Value goals: LDL < 70mg , HDL > 40 mg.       Core Components/Risk  Factors/Patient Goals Review:  Goals and Risk Factor Review    Row Name 07/11/18 1447 07/28/18 1041 08/16/18 1429         Core Components/Risk Factors/Patient Goals Review   Personal Goals Review  Lipids;Diabetes;Hypertension;Weight Management/Obesity  Lipids;Diabetes;Hypertension;Weight Management/Obesity  Lipids;Diabetes;Hypertension;Weight Management/Obesity     Review  Pt willing to participate in CR exercise.  Con Memos would like to lose weight.  Teri's vital signs have been stable. Con Memos did complain of feeling lightheaded last week this has resolved since her losartan was discontinued  Teri's vital signs have been stable. Teri's vital signs and CBG's have been stable at phase 2 cardiac rehab. Con Memos is returning to work at the end of this week     Expected Outcomes  Pt will continue to participate in exercise, nutrition, and lifestyle modification opportunities.   Pt will continue to participate in exercise, nutrition, and lifestyle modification opportunities.   Pt will continue to participate in exercise, nutrition, and lifestyle modification opportunities.         Core Components/Risk Factors/Patient Goals at Discharge (Final Review):  Goals and Risk Factor Review - 08/16/18 1429      Core Components/Risk Factors/Patient Goals Review   Personal Goals Review  Lipids;Diabetes;Hypertension;Weight Management/Obesity    Review  Teri's vital signs have been stable. Teri's vital signs and CBG's have been stable at phase 2 cardiac rehab. Con Memos is returning to work at the end of this week    Expected Outcomes  Pt will continue to participate in exercise, nutrition, and lifestyle modification opportunities.        ITP Comments: ITP Comments    Row Name 07/05/18 0900 07/11/18 1215 07/28/18 1039 08/16/18 1431     ITP Comments  Medical Director- Dr. Fransico Him, MD  Pt started exercise today and tolerated it well.   30 Day ITP Review. Con Memos is off to a good start to exercise  30 Day ITP Review. Con Memos  is with good attendance and partcipation in phase 2 cardiac rehab.       Comments: See ITP Review.Barnet Pall, RN,BSN 08/18/2018 10:36 AM

## 2018-08-17 ENCOUNTER — Encounter (HOSPITAL_COMMUNITY): Payer: 59

## 2018-08-17 ENCOUNTER — Encounter (HOSPITAL_COMMUNITY)
Admission: RE | Admit: 2018-08-17 | Discharge: 2018-08-17 | Disposition: A | Discharge status: Home / Self Care | Payer: 59 | Source: Ambulatory Visit | Attending: Interventional Cardiology | Admitting: Interventional Cardiology

## 2018-08-17 DIAGNOSIS — I2510 Atherosclerotic heart disease of native coronary artery without angina pectoris: Secondary | ICD-10-CM | POA: Diagnosis not present

## 2018-08-17 DIAGNOSIS — Z952 Presence of prosthetic heart valve: Secondary | ICD-10-CM | POA: Diagnosis not present

## 2018-08-17 DIAGNOSIS — F329 Major depressive disorder, single episode, unspecified: Secondary | ICD-10-CM | POA: Diagnosis not present

## 2018-08-17 DIAGNOSIS — Z951 Presence of aortocoronary bypass graft: Secondary | ICD-10-CM | POA: Diagnosis not present

## 2018-08-17 DIAGNOSIS — Z902 Acquired absence of lung [part of]: Secondary | ICD-10-CM | POA: Diagnosis not present

## 2018-08-17 DIAGNOSIS — Z79899 Other long term (current) drug therapy: Secondary | ICD-10-CM | POA: Diagnosis not present

## 2018-08-17 DIAGNOSIS — E119 Type 2 diabetes mellitus without complications: Secondary | ICD-10-CM | POA: Diagnosis not present

## 2018-08-17 DIAGNOSIS — I252 Old myocardial infarction: Secondary | ICD-10-CM | POA: Diagnosis not present

## 2018-08-17 DIAGNOSIS — E785 Hyperlipidemia, unspecified: Secondary | ICD-10-CM | POA: Diagnosis not present

## 2018-08-19 ENCOUNTER — Encounter (HOSPITAL_COMMUNITY): Payer: 59

## 2018-08-22 ENCOUNTER — Encounter (HOSPITAL_COMMUNITY): Payer: 59

## 2018-08-22 ENCOUNTER — Encounter (HOSPITAL_COMMUNITY)
Admission: RE | Admit: 2018-08-22 | Discharge: 2018-08-22 | Disposition: A | Discharge status: Home / Self Care | Payer: 59 | Source: Ambulatory Visit | Attending: Interventional Cardiology | Admitting: Interventional Cardiology

## 2018-08-22 DIAGNOSIS — Z902 Acquired absence of lung [part of]: Secondary | ICD-10-CM | POA: Diagnosis not present

## 2018-08-22 DIAGNOSIS — Z951 Presence of aortocoronary bypass graft: Secondary | ICD-10-CM

## 2018-08-22 DIAGNOSIS — I252 Old myocardial infarction: Secondary | ICD-10-CM | POA: Diagnosis not present

## 2018-08-22 DIAGNOSIS — Z952 Presence of prosthetic heart valve: Secondary | ICD-10-CM

## 2018-08-22 DIAGNOSIS — Z79899 Other long term (current) drug therapy: Secondary | ICD-10-CM | POA: Diagnosis not present

## 2018-08-22 DIAGNOSIS — E119 Type 2 diabetes mellitus without complications: Secondary | ICD-10-CM | POA: Diagnosis not present

## 2018-08-22 DIAGNOSIS — E785 Hyperlipidemia, unspecified: Secondary | ICD-10-CM | POA: Diagnosis not present

## 2018-08-22 DIAGNOSIS — I2510 Atherosclerotic heart disease of native coronary artery without angina pectoris: Secondary | ICD-10-CM | POA: Diagnosis not present

## 2018-08-22 DIAGNOSIS — F329 Major depressive disorder, single episode, unspecified: Secondary | ICD-10-CM | POA: Diagnosis not present

## 2018-08-24 ENCOUNTER — Encounter (HOSPITAL_COMMUNITY): Payer: 59

## 2018-08-24 ENCOUNTER — Encounter (HOSPITAL_COMMUNITY)
Admission: RE | Admit: 2018-08-24 | Discharge: 2018-08-24 | Disposition: A | Discharge status: Home / Self Care | Payer: 59 | Source: Ambulatory Visit | Attending: Interventional Cardiology | Admitting: Interventional Cardiology

## 2018-08-24 DIAGNOSIS — I252 Old myocardial infarction: Secondary | ICD-10-CM | POA: Diagnosis not present

## 2018-08-24 DIAGNOSIS — Z952 Presence of prosthetic heart valve: Secondary | ICD-10-CM | POA: Diagnosis not present

## 2018-08-24 DIAGNOSIS — E119 Type 2 diabetes mellitus without complications: Secondary | ICD-10-CM | POA: Diagnosis not present

## 2018-08-24 DIAGNOSIS — E785 Hyperlipidemia, unspecified: Secondary | ICD-10-CM | POA: Diagnosis not present

## 2018-08-24 DIAGNOSIS — Z79899 Other long term (current) drug therapy: Secondary | ICD-10-CM | POA: Diagnosis not present

## 2018-08-24 DIAGNOSIS — Z951 Presence of aortocoronary bypass graft: Secondary | ICD-10-CM | POA: Diagnosis not present

## 2018-08-24 DIAGNOSIS — Z902 Acquired absence of lung [part of]: Secondary | ICD-10-CM | POA: Diagnosis not present

## 2018-08-24 DIAGNOSIS — I2510 Atherosclerotic heart disease of native coronary artery without angina pectoris: Secondary | ICD-10-CM | POA: Diagnosis not present

## 2018-08-24 DIAGNOSIS — F329 Major depressive disorder, single episode, unspecified: Secondary | ICD-10-CM | POA: Diagnosis not present

## 2018-08-24 MED FILL — TRULICITY 0.75 MG/0.5 ML PE: 0.75 | 84 days supply | Qty: 6 | Fill #1

## 2018-08-26 ENCOUNTER — Encounter (HOSPITAL_COMMUNITY): Payer: 59

## 2018-08-29 ENCOUNTER — Encounter (HOSPITAL_COMMUNITY): Payer: 59

## 2018-08-29 ENCOUNTER — Encounter: Payer: Self-pay | Admitting: Nurse Practitioner

## 2018-08-29 ENCOUNTER — Ambulatory Visit (INDEPENDENT_AMBULATORY_CARE_PROVIDER_SITE_OTHER): Payer: 59 | Admitting: Nurse Practitioner

## 2018-08-29 ENCOUNTER — Encounter (HOSPITAL_COMMUNITY)
Admission: RE | Admit: 2018-08-29 | Discharge: 2018-08-29 | Disposition: A | Discharge status: Home / Self Care | Payer: 59 | Source: Ambulatory Visit | Attending: Interventional Cardiology | Admitting: Interventional Cardiology

## 2018-08-29 ENCOUNTER — Telehealth: Payer: Self-pay | Admitting: Pharmacist

## 2018-08-29 VITALS — BP 118/70 | HR 64 | Ht 62.0 in | Wt 197.0 lb

## 2018-08-29 DIAGNOSIS — E785 Hyperlipidemia, unspecified: Secondary | ICD-10-CM | POA: Diagnosis not present

## 2018-08-29 DIAGNOSIS — I252 Old myocardial infarction: Secondary | ICD-10-CM | POA: Diagnosis not present

## 2018-08-29 DIAGNOSIS — Z79899 Other long term (current) drug therapy: Secondary | ICD-10-CM | POA: Diagnosis not present

## 2018-08-29 DIAGNOSIS — E782 Mixed hyperlipidemia: Secondary | ICD-10-CM

## 2018-08-29 DIAGNOSIS — E119 Type 2 diabetes mellitus without complications: Secondary | ICD-10-CM | POA: Diagnosis not present

## 2018-08-29 DIAGNOSIS — I2510 Atherosclerotic heart disease of native coronary artery without angina pectoris: Secondary | ICD-10-CM | POA: Diagnosis not present

## 2018-08-29 DIAGNOSIS — I25709 Atherosclerosis of coronary artery bypass graft(s), unspecified, with unspecified angina pectoris: Secondary | ICD-10-CM

## 2018-08-29 DIAGNOSIS — Z951 Presence of aortocoronary bypass graft: Secondary | ICD-10-CM | POA: Diagnosis not present

## 2018-08-29 DIAGNOSIS — Z952 Presence of prosthetic heart valve: Secondary | ICD-10-CM

## 2018-08-29 DIAGNOSIS — F329 Major depressive disorder, single episode, unspecified: Secondary | ICD-10-CM | POA: Diagnosis not present

## 2018-08-29 DIAGNOSIS — Z902 Acquired absence of lung [part of]: Secondary | ICD-10-CM | POA: Diagnosis not present

## 2018-08-29 NOTE — Telephone Encounter (Signed)
Spoke with patient and she states that she would like to remain on rosuvastatin 10mg  daily. She states that she did have leg pain on Sunday and didn't take one dose, but otherwise has been compliant.   Discussed with patient options (increase rosuvastatin, +ezetimibe, or PCSK9i). She does not wish to increase rosuvastatin due to side effects, but is willing to maintain her current dose. She states that Dr. Tamala Julian did not want her on ezetimibe as he thought it would not help her. Advised that we commonly use in combination with statin, but would not use alone and that I believed that was what Dr. Tamala Julian was referring to. She does not want to do injections if she can avoid them (more due to cost).   Pt would prefer that Dr. Tamala Julian weigh in on ezetimibe, but would be willing to start this medication if he oks this.

## 2018-08-29 NOTE — Telephone Encounter (Signed)
Pt brought labs to visit today with Cecille Rubin. LDL on rosuvastatin 10mg  daily is 82mg /dL. She is wondering what to do with her cholesterol medication. She apparently is still having some itching with current dose, but better.   Previously discussed ezetimibe therapy. Will add if willing to remain on current dose of rosuvastatin.   LMOM to discuss above.

## 2018-08-29 NOTE — Progress Notes (Signed)
CARDIOLOGY OFFICE NOTE  Date:  08/29/2018    [name] Date of Birth: 01-05-1956 Medical Record #[id number]  PCP:  Christain Sacramento, MD  Cardiologist:  Jennings Books    Chief Complaint  Patient presents with  . Follow-up    Follow up visit - seen for Dr. Tamala Julian    History of Present Illness: Sarah Stevenson is a 62 y.o. female who presents today for a follow up visit. Seen for Dr. Tamala Julian.   She has a history of CAD with prior RCA DES in March of 2017 and to the mid RCA in November 2017 in the setting of angina, AS, DM, HLD, and OA. She has a bicuspid aortic valve. She had residual LAD disease from last cath in November of 2017.   She was seen by me back in July - noting progressive symptoms over the prior 6 months. Cardiac cath was recommended. Found to have worsening AS as well as CAD and was referred to [facility] for surgical consideration.   She underwent her surgery in September. She had CABG x 2 with LIMA to LAD and SVG to RCA. She also underwent AVR with a #56mm Magna Ease pericardial valve. She also had a lung resection with adenocarcinoma.  Post op course was uneventful. Some volume overload and hypertension as well as expected anemia. Discharged to home.   I saw her in October for her post op visit - was feeling pretty rough - lots of issues. Very upset about her cancer diagnosis. Subsequent PET scan demonstrates no evidence of metastatic disease.  Saw Dr. Tamala Julian at the end of October and saw Mickel Baas last month - Losartan had been resumed - she got hypotensive. She has not been able to tolerate her statin due to severe musculoskeletal complaints.   Comes in today. Here alone today. She is back to work. She is pretty much done with cardiac rehab. She is "doing so much better" than when I last saw her.  She is happy with how she is doing. She is taking Tumeric to help with her arthritis. She is on Effient due to aspirin allergy. She is now on Crestor 10 mg per the lipid  clinic. Had labs 11/27 - LDL is 82, TC 130, Trig 225 and HLD 24. She apparently has had hives from the Crestor - she is having some continued issues with her muscles/joints/hives- this is why she took the Tumeric. She does not feel like she will be able to increast that dose.  Her A1C has improved to 7.1. Only needs 6 month follow up CT from the standpoint of her lung cancer. BP remains low - she remains off her ARB. Her weight is down about 15 pounds since the summer.   Past Medical History:  Diagnosis Date  . Anginal pain ([place])   . Aortic stenosis   . Coronary artery disease   . Depression    "mild" (08/06/2016)  . Dyspnea   . Hyperlipidemia   . Murmur, cardiac   . Myocardial infarction ([place]) 2016  . Osteoarthritis    "left hip and knee" (08/06/2016)  . Type II diabetes mellitus ([place])     Past Surgical History:  Procedure Laterality Date  . AORTIC VALVE REPLACEMENT N/A 05/17/2018   Procedure: AORTIC VALVE REPLACEMENT (AVR) USING 21 MM MAGNA EASE PERICARDIAL BIOPROSTHESIS. MODEL # 3300TFX. SERIAL # [id number];  Surgeon: Grace Isaac, MD;  Location: [facility];  Service: Open Heart Surgery;  Laterality: N/A;  .  CARDIAC CATHETERIZATION Right 11/30/2014   Procedure: RIGHT/LEFT HEART CATH AND CORONARY ANGIOGRAPHY;  Surgeon: Belva Crome, MD;  Location: [facility] CATH LAB;  Service: Cardiovascular;  Laterality: Right;  . CARDIAC CATHETERIZATION N/A 12/13/2015   Procedure: Left Heart Cath and Coronary Angiography;  Surgeon: Leonie Man, MD;  Location: [place] [place] CV LAB;  Service: Cardiovascular;  Laterality: N/A;  . CARDIAC CATHETERIZATION N/A 12/13/2015   Procedure: Intravascular Pressure Wire/FFR Study;  Surgeon: Leonie Man, MD;  Location: [place] CV LAB;  Service: Cardiovascular;  Laterality: N/A;  RCA  . CARDIAC CATHETERIZATION N/A 08/06/2016   Procedure: Right/Left Heart Cath and Coronary Angiography;  Surgeon: Lorretta Harp, MD;  Location: [place] [facility] CV LAB;  Service:  Cardiovascular;  Laterality: N/A;  . CARDIAC CATHETERIZATION N/A 08/06/2016   Procedure: Coronary Stent Intervention;  Surgeon: Lorretta Harp, MD;  Location: [place] CV LAB;  Service: Cardiovascular;  Laterality: N/A;   distal rca 3.0x16 and 3.0x8 synergy  . CESAREAN SECTION  1989  . CORONARY ANGIOPLASTY WITH STENT PLACEMENT  08/06/2016   "2 stents"  . CORONARY ARTERY BYPASS GRAFT N/A 05/17/2018   Procedure: CORONARY ARTERY BYPASS GRAFTING (CABG) X 2 WITH ENDOSCOPIC HARVESTING OF RIGHT SAPHENOUS VEIN. LIMA TO LAD. SVG TO RCA;  Surgeon: Grace Isaac, MD;  Location: [facility];  Service: Open Heart Surgery;  Laterality: N/A;  . HYSTEROSCOPY DIAGNOSTIC  1990s  . JOINT REPLACEMENT    . KNEE ARTHROSCOPY Left 1996  . NASAL SINUS SURGERY  1977  . RIGHT/LEFT HEART CATH AND CORONARY ANGIOGRAPHY N/A 04/01/2018   Procedure: RIGHT/LEFT HEART CATH AND CORONARY ANGIOGRAPHY;  Surgeon: Belva Crome, MD;  Location: [facility] CV LAB;  Service: Cardiovascular;  Laterality: N/A;  . TEE WITHOUT CARDIOVERSION N/A 05/17/2018   Procedure: TRANSESOPHAGEAL ECHOCARDIOGRAM (TEE);  Surgeon: Grace Isaac, MD;  Location: [place] [facility];  Service: Open Heart Surgery;  Laterality: N/A;  . TOTAL HIP ARTHROPLASTY Left 04/23/2017   Procedure: LEFT TOTAL HIP ARTHROPLASTY ANTERIOR APPROACH;  Surgeon: Mcarthur Rossetti, MD;  Location: [facility];  Service: Orthopedics;  Laterality: Left;  . WEDGE RESECTION Left 05/17/2018   Procedure: LEFT UPPER LOBE LUNG WEDGE RESECTION;  Surgeon: Grace Isaac, MD;  Location: [place] [facility];  Service: Open Heart Surgery;  Laterality: Left;     Medications: Current Meds  Medication Sig  . acetaminophen (TYLENOL) 500 MG tablet Take 500-1,000 mg by mouth 2 (two) times daily as needed for moderate pain.  . carvedilol (COREG) 6.25 MG tablet Take 1 tablet (6.25 mg total) by mouth 2 (two) times daily with a meal.  . cetirizine (ZYRTEC) 10 MG tablet Take 10 mg by mouth daily.  . famotidine  (PEPCID) 20 MG tablet Take 20 mg by mouth at bedtime.  . fluticasone (FLONASE) 50 MCG/ACT nasal spray Place 1 spray into both nostrils daily as needed for allergies or rhinitis.  Marland Kitchen glipiZIDE (GLUCOTROL XL) 10 MG 24 hr tablet Take 10 mg by mouth daily with breakfast.   . Menthol-Methyl Salicylate (MUSCLE RUB EX) Apply 1 application topically as needed (for pain).  . metFORMIN (GLUCOPHAGE-XR) 750 MG 24 hr tablet Take 1,500 mg by mouth every evening.  . prasugrel (EFFIENT) 10 MG TABS tablet Take 1 tablet (10 mg total) by mouth daily.  . rosuvastatin (CRESTOR) 10 MG tablet Take 1 tablet (10 mg total) by mouth daily.  . sertraline (ZOLOFT) 50 MG tablet Take 25 mg by mouth daily.   . TRULICITY 8.75 IE/3.3IR SOPN Inject  0.75 mg into the skin once a week.   . TURMERIC CURCUMIN PO Take 2,600 mg by mouth daily.     Allergies: Allergies  Allergen Reactions  . Brilinta [Ticagrelor] Shortness Of Breath  . Lisinopril Cough  . Asa [Aspirin] Hives and Rash  . Blue Dyes (Parenteral) Hives  . Corn-Containing Products Hives  . Nsaids Hives and Rash  . Sugar-Protein-Starch Hives  . Tea Hives  . Isosulfan Blue Rash  . Other Itching and Rash    CHG soap    Social History: The patient  reports that she quit smoking about 38 years ago. Her smoking use included cigarettes. She has a 10.00 pack-year smoking history. She has never used smokeless tobacco. She reports current alcohol use of about 7.0 standard drinks of alcohol per week. She reports that she does not use drugs.   Family History: The patient's family history includes Heart attack in her father and mother; Sudden death in her father.   Review of Systems: Please see the history of present illness.   Otherwise, the review of systems is positive for none.   All other systems are reviewed and negative.   Physical Exam: VS:  BP 118/70   Pulse 64   Ht 5\' 2"  (1.575 m)   Wt 197 lb (89.4 kg)   BMI 36.03 kg/m  .  BMI Body mass index is 36.03  kg/m.  Wt Readings from Last 3 Encounters:  08/29/18 197 lb (89.4 kg)  07/25/18 197 lb 9.6 oz (89.6 kg)  07/12/18 198 lb (89.8 kg)    General: Alert. She is in no acute distress.  She is down from 212 back in July on a visit with me.  HEENT: Normal.  Neck: Supple, no JVD, carotid bruits, or masses noted.  Cardiac: Regular rate and rhythm. Valve sounds good - very soft outflow murmur.  No edema.  Respiratory:  Lungs are clear to auscultation bilaterally with normal work of breathing.  GI: Soft and nontender.  MS: No deformity or atrophy. Gait and ROM intact.  Skin: Warm and dry. Color is normal.  Neuro:  Strength and sensation are intact and no gross focal deficits noted.  Psych: Alert, appropriate and with normal affect.   LABORATORY DATA:  EKG:  EKG is not ordered today.  Lab Results  Component Value Date   WBC 6.2 07/12/2018   HGB 10.9 (L) 07/12/2018   HCT 35.5 (L) 07/12/2018   PLT 255 07/12/2018   GLUCOSE 142 (H) 07/12/2018   CHOL 153 05/11/2017   TRIG 281 (H) 05/11/2017   HDL 29 (L) 05/11/2017   LDLCALC 68 05/11/2017   ALT 10 07/12/2018   AST 11 (L) 07/12/2018   NA 139 07/12/2018   K 4.1 07/12/2018   CL 105 07/12/2018   CREATININE 1.03 (H) 07/12/2018   BUN 10 07/12/2018   CO2 26 07/12/2018   INR 1.53 05/17/2018   HGBA1C 9.3 (H) 05/12/2018     BNP (last 3 results) No results for input(s): BNP in the last 8760 hours.  ProBNP (last 3 results) No results for input(s): PROBNP in the last 8760 hours.   Other Studies Reviewed Today:  Echo Study Conclusions 06/2018  - Procedure narrative: Transthoracic echocardiography. Image   quality was poor. The study was technically difficult.   Intravenous contrast (Definity) was administered to opacify the   LV. - Left ventricle: The cavity size was normal. Wall thickness was   normal. Systolic function was normal. The estimated ejection  fraction was in the range of 55% to 60%. Wall motion was normal;   there  were no regional wall motion abnormalities. Left   ventricular diastolic function parameters were normal. - Ventricular septum: Septal motion showed paradox. These changes   are consistent with a post-thoracotomy state. - Aortic valve: A bioprosthesis was present and functioning   normally. - Left atrium: The atrium was mildly dilated. - Atrial septum: No defect or patent foramen ovale was identified.   PROCEDURE:Procedure(s) 05/17/2018:  CORONARY ARTERY BYPASS GRAFTING (CABG) X 2 WITH ENDOSCOPIC HARVESTING OF RIGHT SAPHENOUS VEIN. LIMA TO LAD. SVG TO RCA (N/A) AORTIC VALVE REPLACEMENT (AVR) USING 21 MM MAGNA EASE PERICARDIAL BIOPROSTHESIS. MODEL # 3300TFX. SERIAL # [id number] (N/A) TRANSESOPHAGEAL ECHOCARDIOGRAM (TEE) (N/A) LEFT UPPER LOBE LUNG WEDGE RESECTION (Left)   Intraop EE Conclusions 05/2018  Result status: Final result   Left atrium: Cavity is mildly dilated.  Aortic valve: The valve is trileaflet. Severe valve thickening present. Severe valve calcification present. Severely decreased leaflet separation. Severe stenosis. No regurgitation.  Mitral valve: Trace regurgitation.  Right ventricle: Normal cavity size, wall thickness and ejection fraction.  Septum: No Patent Foramen Ovale present.  Left atrium: Patent foramen ovale not present.    RIGHT/LEFT HEART CATH AND CORONARY ANGIOGRAPHY 72019  Conclusion    Calcific aortic stenosis, moderately severe with calculated aortic valve area 0.92 cm. Peak to peak gradient 45 mmHg.  Two-vessel coronary artery disease with 70% mid LAD stenosis and 50% in-stent restenosis in the right coronary within the region of overlapping double layer stent.  Widely patent circumflex artery.  Normal left main.  Normal pulmonary pressures and left ventricular hemodynamics.  Limiting exertional dyspnea secondary to aortic stenosis predominantly with contribution from CAD.  Recommendations:   Aortic valve  replacement  LIMA to LAD and SVG to RCA  Patient has an appointment to see Dr. Servando Snare.     Assessment/Plan:  1. Prior CABG x 2 with AVR from 05/2018 - she has had slow progress but now doing very well. Back to work. CV risk factor modification encouraged.   2. Lung resection for adenocarcinoma - PET was negative for metastatic disease - she does not need further adjunct therapy.    3. HTN - BP soft - her ARB has been stopped again due to symptomatic hypotension - would not try to restart. EF is normal.   4. HLD - on low dose statin - still with tolerability issues - will route to lipid clinic.   5. DM - this looks better.    Current medicines are reviewed with the patient today.  The patient does not have concerns regarding medicines other than what has been noted above.  The following changes have been made:  See above.  Labs/ tests ordered today include:   No orders of the defined types were placed in this encounter.    Disposition:   FU with Dr. Tamala Julian in March as planned. I am happy to see back as needed.    Patient is agreeable to this plan and will call if any problems develop in the interim.   SignedTruitt Merle, NP  08/29/2018 2:06 PM  [place] [address] [place], [place]  [address] Phone: [phone] Fax: [phone]

## 2018-08-29 NOTE — Patient Instructions (Addendum)
We will be checking the following labs today - NONE  I am going to send a message to the lipid clinic about your medicine.   Medication Instructions:    Continue with your current medicines.    If you need a refill on your cardiac medications before your next appointment, please call your pharmacy.     Testing/Procedures To Be Arranged:  N/A  Follow-Up:   See Dr. Tamala Julian in March as planned.      At [facility], you and your health needs are our priority.  As part of our continuing mission to provide you with exceptional heart care, we have created designated Provider Care Teams.  These Care Teams include your primary Cardiologist (physician) and Advanced Practice Providers (APPs -  Physician Assistants and Nurse Practitioners) who all work together to provide you with the care you need, when you need it.  Special Instructions:  . Keep up the good work!  Call the [place] office at [phone] if you have any questions, problems or concerns.

## 2018-09-02 ENCOUNTER — Encounter (HOSPITAL_COMMUNITY): Payer: 59

## 2018-09-02 ENCOUNTER — Encounter (HOSPITAL_COMMUNITY)
Admission: RE | Admit: 2018-09-02 | Discharge: 2018-09-02 | Disposition: A | Discharge status: Home / Self Care | Payer: 59 | Source: Ambulatory Visit | Attending: Interventional Cardiology | Admitting: Interventional Cardiology

## 2018-09-02 VITALS — BP 136/82 | HR 90 | Ht 62.0 in | Wt 198.9 lb

## 2018-09-02 DIAGNOSIS — Z952 Presence of prosthetic heart valve: Secondary | ICD-10-CM

## 2018-09-02 DIAGNOSIS — Z902 Acquired absence of lung [part of]: Secondary | ICD-10-CM | POA: Diagnosis not present

## 2018-09-02 DIAGNOSIS — E119 Type 2 diabetes mellitus without complications: Secondary | ICD-10-CM | POA: Diagnosis not present

## 2018-09-02 DIAGNOSIS — Z79899 Other long term (current) drug therapy: Secondary | ICD-10-CM | POA: Diagnosis not present

## 2018-09-02 DIAGNOSIS — I252 Old myocardial infarction: Secondary | ICD-10-CM | POA: Diagnosis not present

## 2018-09-02 DIAGNOSIS — E785 Hyperlipidemia, unspecified: Secondary | ICD-10-CM | POA: Diagnosis not present

## 2018-09-02 DIAGNOSIS — Z951 Presence of aortocoronary bypass graft: Secondary | ICD-10-CM

## 2018-09-02 DIAGNOSIS — I2510 Atherosclerotic heart disease of native coronary artery without angina pectoris: Secondary | ICD-10-CM | POA: Diagnosis not present

## 2018-09-02 DIAGNOSIS — F329 Major depressive disorder, single episode, unspecified: Secondary | ICD-10-CM | POA: Diagnosis not present

## 2018-09-04 NOTE — Telephone Encounter (Signed)
Needs Zetia added to Crestor and if not at target, < 70 LDL, would start PCSK-9 as she is high risk for future events.

## 2018-09-05 ENCOUNTER — Encounter (HOSPITAL_COMMUNITY)
Admission: RE | Admit: 2018-09-05 | Discharge: 2018-09-05 | Disposition: A | Discharge status: Home / Self Care | Payer: 59 | Source: Ambulatory Visit | Attending: Interventional Cardiology | Admitting: Interventional Cardiology

## 2018-09-05 ENCOUNTER — Encounter (HOSPITAL_COMMUNITY): Payer: 59

## 2018-09-05 DIAGNOSIS — E785 Hyperlipidemia, unspecified: Secondary | ICD-10-CM | POA: Diagnosis not present

## 2018-09-05 DIAGNOSIS — Z79899 Other long term (current) drug therapy: Secondary | ICD-10-CM | POA: Diagnosis not present

## 2018-09-05 DIAGNOSIS — Z902 Acquired absence of lung [part of]: Secondary | ICD-10-CM | POA: Diagnosis not present

## 2018-09-05 DIAGNOSIS — I2510 Atherosclerotic heart disease of native coronary artery without angina pectoris: Secondary | ICD-10-CM | POA: Diagnosis not present

## 2018-09-05 DIAGNOSIS — F329 Major depressive disorder, single episode, unspecified: Secondary | ICD-10-CM | POA: Diagnosis not present

## 2018-09-05 DIAGNOSIS — Z952 Presence of prosthetic heart valve: Secondary | ICD-10-CM

## 2018-09-05 DIAGNOSIS — Z951 Presence of aortocoronary bypass graft: Secondary | ICD-10-CM

## 2018-09-05 DIAGNOSIS — I252 Old myocardial infarction: Secondary | ICD-10-CM | POA: Diagnosis not present

## 2018-09-05 DIAGNOSIS — E119 Type 2 diabetes mellitus without complications: Secondary | ICD-10-CM | POA: Diagnosis not present

## 2018-09-05 MED ORDER — EZETIMIBE 10 MG PO TABS: 10.0000 mg | ORAL_TABLET | Freq: Every day | ORAL | 3 refills | Status: DC

## 2018-09-05 MED FILL — EZETIMIBE 10 MG TABS: 10 | 90 days supply | Qty: 90 | Fill #0

## 2018-09-05 NOTE — Progress Notes (Signed)
Discharge Progress Report  Patient Details  Name: Sarah Stevenson MRN: [id number] Date of Birth: 1956/05/21 Referring Provider:     CARDIAC REHAB PHASE II ORIENTATION from 07/05/2018 in [place]  Referring Provider  Dr. Daneen Schick       Number of Visits: 15  Reason for Discharge:  Early Exit:  Insurance and Back to work  Smoking History:  Social History   Tobacco Use  Smoking Status Former Smoker  . Packs/day: 1.00  . Years: 10.00  . Pack years: 10.00  . Types: Cigarettes  . Last attempt to quit: 1982  . Years since quitting: 38.0  Smokeless Tobacco Never Used  Tobacco Comment   restarted  about 5-6 yrs ago, then stopped    Diagnosis:  05/17/18 CABG x2  05/17/18 AVR (tissue)  ADL [facility]:   Initial Exercise Prescription: Initial Exercise Prescription - 07/05/18 1400      Date of Initial Exercise RX and Referring Provider   Date  07/05/18    Referring Provider  Dr. Daneen Schick    Expected Discharge Date  10/14/18      Recumbant Bike   Level  2    Watts  35    Minutes  10    METs  3.2      NuStep   Level  3    SPM  75    Minutes  10    METs  3.2      Track   Laps  13    Minutes  10    METs  3.1      Prescription Details   Frequency (times per week)  3    Duration  Progress to 30 minutes of continuous aerobic without signs/symptoms of physical distress      Intensity   THRR 40-80% of Max Heartrate  63-126    Ratings of Perceived Exertion  11-13      Progression   Progression  Continue to progress workloads to maintain intensity without signs/symptoms of physical distress.      Resistance Training   Training Prescription  Yes    Weight  2    Reps  10-15       Discharge Exercise Prescription (Final Exercise Prescription Changes): Exercise Prescription Changes - 09/05/18 0953      Response to Exercise   Blood Pressure (Admit)  136/82    Blood Pressure (Exercise)  158/76    Blood Pressure (Exit)  108/62     Heart Rate (Admit)  90 bpm    Heart Rate (Exercise)  126 bpm    Heart Rate (Exit)  90 bpm    Rating of Perceived Exertion (Exercise)  12    Symptoms  none    Duration  Progress to 30 minutes of  aerobic without signs/symptoms of physical distress    Intensity  THRR unchanged      Progression   Progression  Continue to progress workloads to maintain intensity without signs/symptoms of physical distress.    Average METs  3      Resistance Training   Training Prescription  Yes    Weight  4lbs    Reps  10-15    Time  10 Minutes      Interval Training   Interval Training  No      Recumbant Bike   Level  2.5    Minutes  10    METs  2.8      NuStep  Level  3    SPM  75    Minutes  10    METs  2.8      Track   Laps  13    Minutes  10    METs  3.26      Home Exercise Plan   Plans to continue exercise at  Home (comment)   walking   Frequency  Add 3 additional days to program exercise sessions.    Initial Home Exercises Provided  07/25/18       Functional Capacity: 6 Minute Walk    Row Name 07/05/18 1342 09/02/18 1022       6 Minute Walk   Phase  Initial  Discharge    Distance  1478 feet  1800 feet    Distance % Change  -  21.79 %    Walk Time  6 minutes  6 minutes    # of Rest Breaks  0  0    MPH  2.8  3.41    METS  3.31  4.95    RPE  12  13    Perceived Dyspnea   -  0    VO2 Peak  11.58  17.31    Symptoms  No  No    Resting HR  78 bpm  86 bpm    Resting BP  118/68  128/82    Resting Oxygen Saturation   95 %  -    Exercise Oxygen Saturation  during 6 min walk  97 %  -    Max Ex. HR  111 bpm  156 bpm    Max Ex. BP  142/72  192/80    2 Minute Post BP  104/72  143/78       Psychological, QOL, Others - Outcomes: PHQ 2/9: Depression screen [facility] 2/9 09/29/2018 09/05/2018 07/11/2018  Decreased Interest 0 0 0  Down, Depressed, Hopeless 0 0 0  PHQ - 2 Score 0 0 0    Quality of Life: Quality of Life - 09/05/18 1158      Quality of Life   Select   Quality of Life      Quality of Life Scores   Health/Function Pre  26.4 %    Health/Function Post  28.2 %    Health/Function % Change  6.82 %    Socioeconomic Pre  28.94 %    Socioeconomic Post  30 %    Socioeconomic % Change   3.66 %    Psych/Spiritual Pre  28.29 %    Psych/Spiritual Post  27.21 %    Psych/Spiritual % Change  -3.82 %    Family Pre  30 %    Family Post  28.8 %    Family % Change  -4 %    GLOBAL Pre  27.87 %    GLOBAL Post  28.5 %    GLOBAL % Change  2.26 %       Personal Goals: Goals established at orientation with interventions provided to work toward goal. Personal Goals and Risk Factors at Admission - 07/05/18 1350      Core Components/Risk Factors/Patient Goals on Admission    Weight Management  Yes;Obesity;Weight Maintenance;Weight Loss    Intervention  Weight Management: Develop a combined nutrition and exercise program designed to reach desired caloric intake, while maintaining appropriate intake of nutrient and fiber, sodium and fats, and appropriate energy expenditure required for the weight goal.;Weight Management: Provide education and appropriate resources to help participant work on  and attain dietary goals.;Weight Management/Obesity: Establish reasonable short term and long term weight goals.;Obesity: Provide education and appropriate resources to help participant work on and attain dietary goals.    Admit Weight  198 lb 10.2 oz (90.1 kg)    Expected Outcomes  Short Term: Continue to assess and modify interventions until short term weight is achieved;Long Term: Adherence to nutrition and physical activity/exercise program aimed toward attainment of established weight goal;Weight Maintenance: Understanding of the daily nutrition guidelines, which includes 25-35% calories from fat, 7% or less cal from saturated fats, less than 258m cholesterol, less than 1.5gm of sodium, & 5 or more servings of fruits and vegetables daily;Weight Loss: Understanding of  general recommendations for a balanced deficit meal plan, which promotes 1-2 lb weight loss per week and includes a negative energy balance of [id number] kcal/d;Understanding recommendations for meals to include 15-35% energy as protein, 25-35% energy from fat, 35-60% energy from carbohydrates, less than 2046mof dietary cholesterol, 20-35 gm of total fiber daily;Understanding of distribution of calorie intake throughout the day with the consumption of 4-5 meals/snacks    Diabetes  Yes    Intervention  Provide education about signs/symptoms and action to take for hypo/hyperglycemia.;Provide education about proper nutrition, including hydration, and aerobic/resistive exercise prescription along with prescribed medications to achieve blood glucose in normal ranges: Fasting glucose 65-99 mg/dL    Expected Outcomes  Short Term: Participant verbalizes understanding of the signs/symptoms and immediate care of hyper/hypoglycemia, proper foot care and importance of medication, aerobic/resistive exercise and nutrition plan for blood glucose control.;Long Term: Attainment of HbA1C < 7%.    Hypertension  Yes    Intervention  Provide education on lifestyle modifcations including regular physical activity/exercise, weight management, moderate sodium restriction and increased consumption of fresh fruit, vegetables, and low fat dairy, alcohol moderation, and smoking cessation.;Monitor prescription use compliance.    Expected Outcomes  Short Term: Continued assessment and intervention until BP is < 140/9063mG in hypertensive participants. < 130/80m64m in hypertensive participants with diabetes, heart failure or chronic kidney disease.;Long Term: Maintenance of blood pressure at goal levels.    Lipids  Yes    Intervention  Provide education and support for participant on nutrition & aerobic/resistive exercise along with prescribed medications to achieve LDL <70mg80mL >40mg.14mExpected Outcomes  Short Term: Participant  states understanding of desired cholesterol values and is compliant with medications prescribed. Participant is following exercise prescription and nutrition guidelines.;Long Term: Cholesterol controlled with medications as prescribed, with individualized exercise RX and with personalized nutrition plan. Value goals: LDL < 70mg, 52m> 40 mg.        Personal Goals Discharge: Goals and Risk Factor Review    Row Name 07/11/18 1447 07/28/18 1041 08/16/18 1429 09/05/18 1101       Core Components/Risk Factors/Patient Goals Review   Personal Goals Review  Lipids;Diabetes;Hypertension;Weight Management/Obesity  Lipids;Diabetes;Hypertension;Weight Management/Obesity  Lipids;Diabetes;Hypertension;Weight Management/Obesity  Lipids;Diabetes;Hypertension;Weight Management/Obesity    Review  Pt willing to participate in CR exercise.  Teri woCon Memoslike to lose weight.  Teri's vital signs have been stable. Teri diCon Memosmplain of feeling lightheaded last week this has resolved since her losartan was discontinued  Teri's vital signs have been stable. Teri's vital signs and CBG's have been stable at phase 2 cardiac rehab. Teri isCon Memosurning to work at the end of this week  Teri's vital signs have been stable. Teri's vital signs and CBG's have been stable at phase 2 cardiac rehab. Teri grCon Memosted today due  to insurance and work obligations    Expected Outcomes  Pt will continue to participate in exercise, nutrition, and lifestyle modification opportunities.   Pt will continue to participate in exercise, nutrition, and lifestyle modification opportunities.   Pt will continue to participate in exercise, nutrition, and lifestyle modification opportunities.   Con Memos will continue to participate in exercise on her own upon graduation from phase 2 cardiac rehab, nutrition, and lifestyle modification opportunities.        Exercise Goals and Review: Exercise Goals    Row Name 07/05/18 1346             Exercise Goals   Increase  Physical Activity  Yes       Intervention  Provide advice, education, support and counseling about physical activity/exercise needs.;Develop an individualized exercise prescription for aerobic and resistive training based on initial evaluation findings, risk stratification, comorbidities and participant's personal goals.       Expected Outcomes  Short Term: Attend rehab on a regular basis to increase amount of physical activity.       Increase Strength and Stamina  Yes       Intervention  Provide advice, education, support and counseling about physical activity/exercise needs.;Develop an individualized exercise prescription for aerobic and resistive training based on initial evaluation findings, risk stratification, comorbidities and participant's personal goals.       Expected Outcomes  Short Term: Increase workloads from initial exercise prescription for resistance, speed, and METs.       Able to understand and use rate of perceived exertion (RPE) scale  Yes       Intervention  Provide education and explanation on how to use RPE scale       Expected Outcomes  Short Term: Able to use RPE daily in rehab to express subjective intensity level;Long Term:  Able to use RPE to guide intensity level when exercising independently       Knowledge and understanding of Target Heart Rate Range (THRR)  Yes       Intervention  Provide education and explanation of THRR including how the numbers were predicted and where they are located for reference       Expected Outcomes  Short Term: Able to state/look up THRR;Long Term: Able to use THRR to govern intensity when exercising independently;Short Term: Able to use daily as guideline for intensity in rehab       Able to check pulse independently  Yes       Intervention  Provide education and demonstration on how to check pulse in carotid and radial arteries.;Review the importance of being able to check your own pulse for safety during independent exercise       Expected  Outcomes  Short Term: Able to explain why pulse checking is important during independent exercise;Long Term: Able to check pulse independently and accurately       Understanding of Exercise Prescription  Yes       Intervention  Provide education, explanation, and written materials on patient's individual exercise prescription       Expected Outcomes  Short Term: Able to explain program exercise prescription;Long Term: Able to explain home exercise prescription to exercise independently          Exercise Goals Re-Evaluation: Exercise Goals Re-Evaluation    Row Name 07/25/18 1013 08/22/18 1035 09/02/18 1030 09/05/18 1045       Exercise Goal Re-Evaluation   Exercise Goals Review  Able to understand and use Dyspnea scale;Understanding of Exercise Prescription;Knowledge and  understanding of Target Heart Rate Range (THRR);Able to understand and use rate of perceived exertion (RPE) scale  Able to understand and use Dyspnea scale;Understanding of Exercise Prescription;Knowledge and understanding of Target Heart Rate Range (THRR);Able to understand and use rate of perceived exertion (RPE) scale;Increase Physical Activity  Able to understand and use Dyspnea scale;Understanding of Exercise Prescription;Knowledge and understanding of Target Heart Rate Range (THRR);Able to understand and use rate of perceived exertion (RPE) scale;Increase Physical Activity;Increase Strength and Stamina;Able to check pulse independently  Able to understand and use Dyspnea scale;Understanding of Exercise Prescription;Knowledge and understanding of Target Heart Rate Range (THRR);Able to understand and use rate of perceived exertion (RPE) scale;Increase Physical Activity;Increase Strength and Stamina;Able to check pulse independently    Comments  Reviewed home exercise guidelines including THRR, RPE scale and endpoints for exercise. Pt is walking as her mode of home exercise, achieving ~12,000 steps.  Patient is walking at least 20  minutes daily. Pt also states that she's active with her grandkids. Pt is making steady progress with exercise. Pt has lost 2.4lbs since orientation.  Patient's functional capacity improved 22% as measured by 6MWT and strength improved 17% as measured by grip strength test. Pt will graduate from the program early and continue exercise: walking 20-30 minutes 4-5 days/week, using her elliptical starting at 10 minutes with the goal of increasing duration to 20 mins, and [facility]  Patient completed the phase 2 cardiac rehab program and has progressed well. Reviewed exercise prescription with patient.     Expected Outcomes  Patient will walk 3 days/week in addition to exercise at cardiac rehab to help build a regular exercise routine.  Increase workloads to help achieve personal health and fitness goals.  Patient will exercise 30-40 minutes, 4-5 days week to maintain increase in strength and stamina.  Patient will exercise 30-40 minutes, 4-5 days week to maintain increase in strength and stamina.       Nutrition & Weight - Outcomes: Pre Biometrics - 07/05/18 1347      Pre Biometrics   Height  5' 2" (1.575 m)    Weight  198 lb 10.2 oz (90.1 kg)    Waist Circumference  41.5 inches    Hip Circumference  47.5 inches    Waist to Hip Ratio  0.87 %    BMI (Calculated)  36.32    Triceps Skinfold  26 mm    % Body Fat  45.5 %    Grip Strength  26 kg    Flexibility  16 in    Single Leg Stand  16.84 seconds      Post Biometrics - 09/05/18 0953       Post  Biometrics   Height  5' 2" (1.575 m)    Weight  198 lb 13.7 oz (90.2 kg)    Waist Circumference  39.25 inches    Hip Circumference  47.75 inches    Waist to Hip Ratio  0.82 %    BMI (Calculated)  36.36    Triceps Skinfold  37 mm    % Body Fat  46.6 %    Grip Strength  30.5 kg    Flexibility  16 in       Nutrition: Nutrition Therapy & Goals - 09/30/18 1407      Nutrition Therapy   Diet  heart healthy carb  modified      Personal Nutrition Goals   Nutrition Goal  Pt to identify and limit  food sources of saturated fat, trans fat, refined carbohydrates and sodium   nutrition goal met   Personal Goal #2  Pt able to name foods that affect blood glucose.   nutrition goal met   Personal Goal #3  Pt to identify food quantities necessary to achieve weight loss of 6-24 lbs. at graduation from cardiac rehab. Pt shared ultimate goal weight of 160-165 lbs    weight loss goal not met, continue to encourage weight loss   Personal Goal #4  Improved blood glucose control as evidenced by pt's A1c trending from 9.3 toward less than 7.0.      Intervention Plan   Intervention  Prescribe, educate and counsel regarding individualized specific dietary modifications aiming towards targeted core components such as weight, hypertension, lipid management, diabetes, heart failure and other comorbidities.    Expected Outcomes  Short Term Goal: Understand basic principles of dietary content, such as calories, fat, sodium, cholesterol and nutrients.;Long Term Goal: Adherence to prescribed nutrition plan.       Nutrition Discharge: Nutrition Assessments - 09/30/18 1404      MEDFICTS Scores   Pre Score  36    Post Score  3    Score Difference  -33       Education Questionnaire Score: Knowledge Questionnaire Score - 09/05/18 1158      Knowledge Questionnaire Score   Pre Score  21/24    Post Score  24/24       Goals reviewed with patient; copy given to patient.Con Memos graduated from cardiac rehab program on 09/05/18 with completion of 15 exercise sessions in Phase II. Pt maintained good attendance and progressed nicely during his participation in rehab as evidenced by increased MET level. Con Memos completed the program early due to her insurance plan and to return to work.  Medication list reconciled. Repeat  PHQ score- 0 .  Pt has made significant lifestyle changes and should be commended for her success. Pt feels she has  achieved her goals during cardiac rehab.   Pt plans to continue exercise by using her elliptical at home and may consider joining the [facility]. Con Memos increased her distance on her post exercise walk test and maintained her current weight. We are proud of Teri's progress.Barnet Pall, RN,BSN 10/03/2018 12:21 PM

## 2018-09-05 NOTE — Telephone Encounter (Signed)
Called pt and she is willing to start Zetia 10mg  daily. Rx sent in and f/u labs scheduled in 2-3 months. She will stay on her Crestor 10mg  daily.

## 2018-09-09 ENCOUNTER — Encounter (HOSPITAL_COMMUNITY): Payer: 59

## 2018-09-12 ENCOUNTER — Encounter (HOSPITAL_COMMUNITY): Payer: 59

## 2018-09-14 ENCOUNTER — Encounter (HOSPITAL_COMMUNITY): Payer: 59

## 2018-09-15 MED FILL — ROSUVASTATIN CALCIUM 10 MG: 10 | 30 days supply | Qty: 30 | Fill #1

## 2018-09-15 MED FILL — CARVEDILOL 6.25 MG TABLET: 6.25 | 90 days supply | Qty: 180 | Fill #1

## 2018-09-16 ENCOUNTER — Encounter (HOSPITAL_COMMUNITY): Payer: 59

## 2018-09-16 MED FILL — metFORMIN HCL ER 750 MG TB2: 750 | 90 days supply | Qty: 180 | Fill #0

## 2018-09-19 ENCOUNTER — Encounter (HOSPITAL_COMMUNITY): Payer: 59

## 2018-09-21 ENCOUNTER — Encounter (HOSPITAL_COMMUNITY): Payer: 59

## 2018-09-23 ENCOUNTER — Encounter (HOSPITAL_COMMUNITY): Payer: 59

## 2018-09-26 ENCOUNTER — Encounter (HOSPITAL_COMMUNITY): Payer: 59

## 2018-09-28 ENCOUNTER — Other Ambulatory Visit: Payer: Self-pay | Admitting: Cardiothoracic Surgery

## 2018-09-28 ENCOUNTER — Encounter (HOSPITAL_COMMUNITY): Payer: 59

## 2018-09-28 DIAGNOSIS — C3492 Malignant neoplasm of unspecified part of left bronchus or lung: Secondary | ICD-10-CM

## 2018-09-29 ENCOUNTER — Other Ambulatory Visit: Payer: Self-pay

## 2018-09-29 ENCOUNTER — Ambulatory Visit (INDEPENDENT_AMBULATORY_CARE_PROVIDER_SITE_OTHER): Payer: 59 | Admitting: Cardiothoracic Surgery

## 2018-09-29 ENCOUNTER — Encounter: Payer: Self-pay | Admitting: Cardiothoracic Surgery

## 2018-09-29 ENCOUNTER — Ambulatory Visit
Admission: RE | Admit: 2018-09-29 | Discharge: 2018-09-29 | Disposition: A | Discharge status: Home / Self Care | Payer: 59 | Source: Ambulatory Visit | Attending: Cardiothoracic Surgery | Admitting: Cardiothoracic Surgery

## 2018-09-29 VITALS — BP 145/90 | HR 73 | Resp 18 | Ht 62.0 in | Wt 194.0 lb

## 2018-09-29 DIAGNOSIS — C3492 Malignant neoplasm of unspecified part of left bronchus or lung: Secondary | ICD-10-CM | POA: Diagnosis not present

## 2018-09-29 NOTE — Progress Notes (Signed)
[address]       [place] [address]             [phone]                  Sarah Stevenson Sarah Stevenson Medical Record #[id number] Date of Birth: 11-Apr-1956  Referring GQ:[id number], Sarah Hatcher, NP Primary Cardiology: Primary Care:Christain Sacramento, MD  Chief Complaint:  Follow Up Visit OPERATIVE REPORT DATE OF PROCEDURE:  05/17/2018 PREOPERATIVE DIAGNOSES:  Severe aortic stenosis and coronary occlusive disease. POSTOPERATIVE DIAGNOSES: 1.  Severe aortic stenosis with a trileaflet aortic valve and coronary occlusive disease. 2.  Aortic stenosis with a trileaflet aortic valve 3.  Coronary occlusive disease. 4.  Left upper lobe adenocarcinoma of the lung. PROCEDURES PERFORMED:   1.  Aortic valve replacement with pericardial tissue valve, [employer] model 3300 TFX 21 mm, serial number [id number]. 2.  Coronary artery bypass grafting x2 with the left internal mammary to the left anterior descending coronary artery and reverse saphenous vein graft to the distal right coronary artery with right thigh endovein harvesting.   3.  Wedge resection, left upper lobe lung lesion.  Diagnosis 1. Lung, wedge biopsy/resection, Left Upper Lobe - ADENOCARCINOMA, WELL DIFFERENTIATED, SPANNING 1.2 CM. - SEE ONCOLOGY TABLE BELOW. 2. Lung, wedge biopsy/resection, Left Upper Lobe - ADENOCARCINOMA. 3. Lung, wedge biopsy/resection, Left Upper Lobe - BENIGN LUNG PARENCHYMA. - THERE IS NO EVIDENCE OF MALIGNANCY. 4. Heart valve leaflets - NODULAR FIBROSIS AND CALCIFICATION. Microscopic Comment 1. LUNG: Procedure: Wedge resection and additional margin resections Specimen Laterality: Left upper lobe Tumor Site: Left upper lobe Tumor Size: 1.2 cm (gross measurement) Tumor Focality: Unifocal Histologic Type: Adenocarcinoma, well differentiated Visceral Pleura Invasion: Not identified. Lymphovascular Invasion: Not identified Direct Invasion of Adjacent Structures: Not  identified. Margins: Greater than 0.2 cm to parenchymal margin Treatment Effect: N/A Regional Lymph Nodes: None examined Pathologic Stage Classification (pTNM, AJCC 8th Edition): pT1a, pNX Ancillary Studies: Can be performed upon clinician request Representative Tumor Block: 1B (JBK:kh 05/19/18)  History of Present Illness: Patient returns to the office in follow-up after aortic valve replacement and coronary artery bypass grafting for symptomatic severe aortic stenosis and coronary artery disease.  A groundglass opacity in the left upper lobe was also noted incidentally on preop CT scan, wedge resection of lung confirmed adenocarcinoma  Clinically this appeared to be stage I adenocarcinoma      Patient has continued to increase activity and has returned to work full time. She notes her preop symptoms of sob have improved        Zubrod Score: At the time of surgery this patient's most appropriate activity status/level should be described as: []     0    Normal activity, no symptoms [x]     1    Restricted in physical strenuous activity but ambulatory, able to do out light work []     2    Ambulatory and capable of self care, unable to do work activities, up and about                 >50 % of waking hours                                                                                   []   3    Only limited self care, in bed greater than 50% of waking hours []     4    Completely disabled, no self care, confined to bed or chair []     5    Moribund  Social History   Tobacco Use  Smoking Status Former Smoker  . Packs/day: 1.00  . Years: 10.00  . Pack years: 10.00  . Types: Cigarettes  . Last attempt to quit: 1982  . Years since quitting: 38.0  Smokeless Tobacco Never Used  Tobacco Comment   restarted  about 5-6 yrs ago, then stopped       Allergies  Allergen Reactions  . Brilinta [Ticagrelor] Shortness Of Breath  . Lisinopril Cough  . Asa [Aspirin] Hives and Rash  . Blue  Dyes (Parenteral) Hives  . Corn-Containing Products Hives  . Nsaids Hives and Rash  . Sugar-Protein-Starch Hives  . Tea Hives  . Isosulfan Blue Rash  . Other Itching and Rash    CHG soap    Current Outpatient Medications  Medication Sig Dispense Refill  . acetaminophen (TYLENOL) 500 MG tablet Take 500-1,000 mg by mouth 2 (two) times daily as needed for moderate pain.    . carvedilol (COREG) 6.25 MG tablet Take 1 tablet (6.25 mg total) by mouth 2 (two) times daily with a meal. 180 tablet 3  . cetirizine (ZYRTEC) 10 MG tablet Take 10 mg by mouth daily.    Marland Kitchen ezetimibe (ZETIA) 10 MG tablet Take 1 tablet (10 mg total) by mouth daily. 90 tablet 3  . famotidine (PEPCID) 20 MG tablet Take 20 mg by mouth at bedtime.    . fluticasone (FLONASE) 50 MCG/ACT nasal spray Place 1 spray into both nostrils daily as needed for allergies or rhinitis.    Marland Kitchen glipiZIDE (GLUCOTROL XL) 10 MG 24 hr tablet Take 10 mg by mouth daily with breakfast.     . Menthol-Methyl Salicylate (MUSCLE RUB EX) Apply 1 application topically as needed (for pain).    . metFORMIN (GLUCOPHAGE-XR) 750 MG 24 hr tablet Take 1,500 mg by mouth every evening.    . prasugrel (EFFIENT) 10 MG TABS tablet Take 1 tablet (10 mg total) by mouth daily. 90 tablet 2  . rosuvastatin (CRESTOR) 10 MG tablet Take 1 tablet (10 mg total) by mouth daily. 90 tablet 3  . sertraline (ZOLOFT) 50 MG tablet Take 25 mg by mouth daily.     . TRULICITY 0.93 AT/5.5DD SOPN Inject 0.75 mg into the skin once a week.   3  . TURMERIC CURCUMIN PO Take 2,600 mg by mouth daily.     No current facility-administered medications for this visit.        Physical Exam: BP (!) 145/90 (BP Location: Left Arm, Patient Position: Sitting, Cuff Size: Normal)   Pulse 73   Resp 18   Ht 5\' 2"  (1.575 m)   Wt 194 lb (88 kg)   SpO2 97% Comment: RA  BMI 35.48 kg/m  General appearance: alert, cooperative, appears stated age and no distress Head: Normocephalic, without obvious  abnormality, atraumatic Neck: no adenopathy, no carotid bruit, no JVD, supple, symmetrical, trachea midline and thyroid not enlarged, symmetric, no tenderness/mass/nodules Lymph nodes: Cervical, supraclavicular, and axillary nodes normal. Resp: clear to auscultation bilaterally Back: symmetric, no curvature. ROM normal. No CVA tenderness. Cardio: regular rate and rhythm, S1, S2 normal, no murmur, click, rub or gallop, no murmur of as or ai GI: soft, non-tender; bowel sounds normal; no masses,  no organomegaly  Extremities: extremities normal, atraumatic, no cyanosis or edema and Homans sign is negative, no sign of DVT Neurologic: Grossly normal   Diagnostic Studies & Laboratory data:         Recent Radiology Findings: Dg Chest 2 View  Result Date: 09/29/2018 CLINICAL DATA:  Adenocarcinoma left lung.  Postop resection. EXAM: CHEST - 2 VIEW COMPARISON:  06/23/2018 FINDINGS: Surgical staple line left upper lobe with improvement in surrounding density in the left upper lobe. No recurrent mass lesion. Postop CABG. Negative for heart failure or effusion. Aortic valve replacement. IMPRESSION: Postop changes left upper lobe. No acute cardiopulmonary abnormality. Electronically Signed   By: Franchot Gallo M.D.   On: 09/29/2018 13:37   I have independently reviewed the above radiology studies  and reviewed the findings with the patient.   CLINICAL DATA:  Initial treatment strategy for pulmonary nodule.  EXAM: NUCLEAR MEDICINE PET SKULL BASE TO THIGH  TECHNIQUE: 9.6 mCi F-18 FDG was injected intravenously. Full-ring PET imaging was performed from the skull base to thigh after the radiotracer. CT data was obtained and used for attenuation correction and anatomic localization.  Fasting blood glucose: 128 mg/dl  COMPARISON:  Chest CT 04/15/2018  FINDINGS: Mediastinal blood pool activity: SUV max 2.9  NECK: No hypermetabolic lymph nodes in the neck.  Incidental CT findings:  none  CHEST: Interval midline sternotomy and CABG. Moderate metabolic activity along the sternum related to postsurgical inflammation.  Interval LEFT upper lobe wedge resection. There is staple line with parenchymal thickening in the LEFT upper lobe. There is mild metabolic activity along this surgical margin with SUV max equal 3.9 which is felt benign postsurgical change. No discrete hypermetabolic nodule.  No hypermetabolic mediastinal lymph nodes. There is a focus of atelectasis along the left cardiac border with SUV max equal 4.0 (image 81) which is also favored benign.  Incidental CT findings: none  ABDOMEN/PELVIS: No abnormal hypermetabolic activity within the liver, pancreas, adrenal glands, or spleen. No hypermetabolic lymph nodes in the abdomen or pelvis.  Intense physiologic activity in the bowel.  Incidental CT findings: Uterus and ovaries normal  SKELETON: No focal hypermetabolic activity to suggest skeletal metastasis.  Incidental CT findings: none  IMPRESSION: 1. Postsurgical change in the LEFT upper lobe without clear evidence of carcinoma. Recommend follow-up CT surveillance. 2. No evidence of metastatic disease. 3. Interval sternotomy and CABG   Electronically Signed   By: Suzy Bouchard M.D.   On: 06/30/2018 08:20   Recent Labs: Lab Results  Component Value Date   WBC 6.2 07/12/2018   HGB 10.9 (L) 07/12/2018   HCT 35.5 (L) 07/12/2018   PLT 255 07/12/2018   GLUCOSE 142 (H) 07/12/2018   CHOL 153 05/11/2017   TRIG 281 (H) 05/11/2017   HDL 29 (L) 05/11/2017   LDLCALC 68 05/11/2017   ALT 10 07/12/2018   AST 11 (L) 07/12/2018   NA 139 07/12/2018   K 4.1 07/12/2018   CL 105 07/12/2018   CREATININE 1.03 (H) 07/12/2018   BUN 10 07/12/2018   CO2 26 07/12/2018   INR 1.53 05/17/2018   HGBA1C 9.3 (H) 05/12/2018      Assessment / Plan:  #1 status post coronary artery bypass grafting and aortic valve replacement- stable post op  course  #2 non-small cell carcinoma of the lung well differentiated 1.2 cm left upper lobe, wedge resection completed with negative margins, clinical stage I, for follow up scan in may by Oncology- I will plan to see her back aug 2020  and review scan in May       Danasia Baker B Armel Rabbani 09/30/2018 8:47 AM

## 2018-09-30 ENCOUNTER — Encounter (HOSPITAL_COMMUNITY): Payer: 59

## 2018-10-03 ENCOUNTER — Encounter (HOSPITAL_COMMUNITY): Payer: 59

## 2018-10-05 ENCOUNTER — Encounter (HOSPITAL_COMMUNITY): Payer: 59

## 2018-10-07 ENCOUNTER — Encounter (HOSPITAL_COMMUNITY): Payer: 59

## 2018-10-10 ENCOUNTER — Encounter (HOSPITAL_COMMUNITY): Payer: 59

## 2018-10-12 ENCOUNTER — Encounter (HOSPITAL_COMMUNITY): Payer: 59

## 2018-10-14 ENCOUNTER — Encounter (HOSPITAL_COMMUNITY): Payer: 59

## 2018-10-14 MED FILL — PRASUGREL HCL 10 MG TABS: 10 | 90 days supply | Qty: 90 | Fill #2

## 2018-10-17 ENCOUNTER — Encounter (HOSPITAL_COMMUNITY): Payer: 59

## 2018-10-19 ENCOUNTER — Encounter (HOSPITAL_COMMUNITY): Payer: 59

## 2018-10-21 ENCOUNTER — Telehealth: Payer: Self-pay | Admitting: Pharmacist

## 2018-10-21 ENCOUNTER — Encounter (HOSPITAL_COMMUNITY): Payer: 59

## 2018-10-21 NOTE — Telephone Encounter (Signed)
Pt called to report that the pain has become unbearable on rosuvastatin. She stopped this for a few days and started to feel better. Last night she took both the rosuvastatin and ezetimibe and feels terrible today. She would like to remain off rosuvastatin d/r to the aching.   She is willing to remain on ezetimibe for now and will call if unable to tolerate.   Discussed options including retrial of statin and PCSK9i therapy. She would like to proceed with PCSK9i therapy.   Will send for coverage through insurance and alert her when approved. She should qualify for copay card given her insurance.

## 2018-10-24 ENCOUNTER — Encounter (HOSPITAL_COMMUNITY): Payer: 59

## 2018-10-26 ENCOUNTER — Encounter (HOSPITAL_COMMUNITY): Payer: 59

## 2018-10-28 ENCOUNTER — Encounter (HOSPITAL_COMMUNITY): Payer: 59

## 2018-10-31 ENCOUNTER — Encounter (HOSPITAL_COMMUNITY): Payer: 59

## 2018-11-02 ENCOUNTER — Encounter (HOSPITAL_COMMUNITY): Payer: 59

## 2018-11-04 ENCOUNTER — Encounter (HOSPITAL_COMMUNITY): Payer: 59

## 2018-11-07 ENCOUNTER — Encounter (HOSPITAL_COMMUNITY): Payer: 59

## 2018-11-09 ENCOUNTER — Encounter (HOSPITAL_COMMUNITY): Payer: 59

## 2018-11-10 MED FILL — SERTRALINE HCL 50 MG TABLET: 50 | 90 days supply | Qty: 90 | Fill #2

## 2018-11-10 MED FILL — glipiZIDE ER 10 MG TB24: 10 | 90 days supply | Qty: 90 | Fill #0

## 2018-11-10 MED FILL — TRULICITY 0.75 MG/0.5 ML PE: 0.75 | 84 days supply | Qty: 6 | Fill #2

## 2018-11-11 ENCOUNTER — Encounter (HOSPITAL_COMMUNITY): Payer: 59

## 2018-11-14 ENCOUNTER — Encounter (HOSPITAL_COMMUNITY): Payer: 59

## 2018-11-16 ENCOUNTER — Encounter (HOSPITAL_COMMUNITY): Payer: 59

## 2018-11-21 ENCOUNTER — Telehealth: Payer: Self-pay

## 2018-11-21 ENCOUNTER — Telehealth: Payer: Self-pay | Admitting: Interventional Cardiology

## 2018-11-21 ENCOUNTER — Other Ambulatory Visit: Payer: Self-pay

## 2018-11-21 ENCOUNTER — Other Ambulatory Visit: Payer: 59 | Admitting: *Deleted

## 2018-11-21 DIAGNOSIS — E782 Mixed hyperlipidemia: Secondary | ICD-10-CM | POA: Diagnosis not present

## 2018-11-21 LAB — HEPATIC FUNCTION PANEL
ALBUMIN: 4 g/dL (ref 3.8–4.8)
ALT: 12 IU/L (ref 0–32)
AST: 14 IU/L (ref 0–40)
Alkaline Phosphatase: 103 IU/L (ref 39–117)
BILIRUBIN TOTAL: 0.4 mg/dL (ref 0.0–1.2)
BILIRUBIN, DIRECT: 0.14 mg/dL (ref 0.00–0.40)
TOTAL PROTEIN: 6.5 g/dL (ref 6.0–8.5)

## 2018-11-21 LAB — LIPID PANEL
CHOL/HDL RATIO: 4.7 ratio — AB (ref 0.0–4.4)
Cholesterol, Total: 109 mg/dL (ref 100–199)
HDL: 23 mg/dL — ABNORMAL LOW (ref >39)
LDL CALC: 62 mg/dL (ref 0–99)
TRIGLYCERIDES: 119 mg/dL (ref 0–149)
VLDL Cholesterol Cal: 24 mg/dL (ref 5–40)

## 2018-11-21 MED ORDER — EVOLOCUMAB 140 MG/ML ~~LOC~~ SOAJ: 140.0000 mg | SUBCUTANEOUS | 11 refills | Status: DC

## 2018-11-21 MED FILL — REPATHA SURECLICK 140 MG/ML: 140 | 28 days supply | Qty: 2 | Fill #0

## 2018-11-21 NOTE — Telephone Encounter (Signed)
Coronavirus 19 rescheduling.  Patient is doing well and needs an appointment in 8 weeks. Need to have lipid clinic start PCSK9, which patient has received a notification from the insurance company that they will cover.

## 2018-11-21 NOTE — Telephone Encounter (Signed)
Left message for pt to call back to reschedule.  Needs to be scheduled anytime in May that is open.

## 2018-11-21 NOTE — Telephone Encounter (Signed)
Called and let pt know that the rx was sent and to call the clinic back if they cannot afford med

## 2018-11-21 NOTE — Telephone Encounter (Signed)
Confirmed pt is approved for Repatha through insurance. Will send RX to pharmacy.

## 2018-11-22 ENCOUNTER — Ambulatory Visit: Payer: 59 | Admitting: Interventional Cardiology

## 2018-11-24 NOTE — Telephone Encounter (Signed)
See results notes from lipid panel on 11/21/18 for details.

## 2018-11-24 NOTE — Progress Notes (Signed)
Thanks

## 2018-11-25 ENCOUNTER — Other Ambulatory Visit: Payer: Self-pay | Admitting: Interventional Cardiology

## 2018-11-25 MED FILL — ROSUVASTATIN CALCIUM 10 MG: 10 | 90 days supply | Qty: 90 | Fill #0

## 2018-11-25 MED FILL — EZETIMIBE 10 MG TABS: 10 | 90 days supply | Qty: 90 | Fill #1

## 2018-11-25 NOTE — Telephone Encounter (Signed)
Pt rescheduled to 01/11/2019

## 2018-12-01 DIAGNOSIS — J069 Acute upper respiratory infection, unspecified: Secondary | ICD-10-CM | POA: Diagnosis not present

## 2018-12-01 DIAGNOSIS — Z7189 Other specified counseling: Secondary | ICD-10-CM | POA: Diagnosis not present

## 2018-12-01 MED FILL — AMOXICILLIN 500 MG CAPSULE: 500 | 20 days supply | Qty: 40 | Fill #0

## 2018-12-28 MED FILL — metFORMIN HCL ER 750 MG TB2: 750 | 90 days supply | Qty: 180 | Fill #1

## 2018-12-28 MED FILL — TRIAMCINOLONE 0.1% CREAM: 0.1 | 15 days supply | Qty: 15 | Fill #1

## 2018-12-28 MED FILL — CARVEDILOL 6.25 MG TABLET: 6.25 | 90 days supply | Qty: 180 | Fill #2

## 2019-01-05 ENCOUNTER — Telehealth: Payer: Self-pay | Admitting: Interventional Cardiology

## 2019-01-05 NOTE — Telephone Encounter (Signed)
Attempted to contact pt in regards to appt with Dr. Tamala Julian on 5/6.  Left message to call back.  Needs to be changed to a virtual visit with D[employer] if possible.  Was going to offer 5/6 at 12:30, 2:30 or 3pm, whichever was still available. Please send to me or Earvin Hansen for scheduling.

## 2019-01-06 NOTE — Telephone Encounter (Signed)
Follow up:    Patient returning call back. Please call patient.

## 2019-01-06 NOTE — Telephone Encounter (Signed)
Spoke with pt and she is agreeable to phone visit on 5/6 with Dr. Tamala Julian. Went over consent.  Pt aware that CMA will be calling prior to appt to go over meds and get vitals.      Virtual Visit Pre-Appointment Phone Call  "(Name), I am calling you today to discuss your upcoming appointment. We are currently trying to limit exposure to the virus that causes COVID-19 by seeing patients at home rather than in the office."  1. "What is the BEST phone number to call the day of the visit?" - include this in appointment notes  2. "Do you have or have access to (through a family member/friend) a smartphone with video capability that we can use for your visit?" a. If yes - list this number in appt notes as "cell" (if different from BEST phone #) and list the appointment type as a VIDEO visit in appointment notes b. If no - list the appointment type as a PHONE visit in appointment notes  3. Confirm consent - "In the setting of the current Covid19 crisis, you are scheduled for a (phone or video) visit with your provider on (date) at (time).  Just as we do with many in-office visits, in order for you to participate in this visit, we must obtain consent.  If you'd like, I can send this to your mychart (if signed up) or email for you to review.  Otherwise, I can obtain your verbal consent now.  All virtual visits are billed to your insurance company just like a normal visit would be.  By agreeing to a virtual visit, we'd like you to understand that the technology does not allow for your provider to perform an examination, and thus may limit your provider's ability to fully assess your condition. If your provider identifies any concerns that need to be evaluated in person, we will make arrangements to do so.  Finally, though the technology is pretty good, we cannot assure that it will always work on either your or our end, and in the setting of a video visit, we may have to convert it to a phone-only visit.  In either  situation, we cannot ensure that we have a secure connection.  Are you willing to proceed?" STAFF: Did the patient verbally acknowledge consent to telehealth visit? Document YES/NO here: yes   4. Advise patient to be prepared - "Two hours prior to your appointment, go ahead and check your blood pressure, pulse, oxygen saturation, and your weight (if you have the equipment to check those) and write them all down. When your visit starts, your provider will ask you for this information. If you have an Apple Watch or Kardia device, please plan to have heart rate information ready on the day of your appointment. Please have a pen and paper handy nearby the day of the visit as well."  5. Give patient instructions for MyChart download to smartphone OR [email or web address] as below if video visit (depending on what platform provider is using)  6. Inform patient they will receive a phone call 15 minutes prior to their appointment time (may be from unknown caller ID) so they should be prepared to answer    TELEPHONE CALL NOTE  Sarah Stevenson has been deemed a candidate for a follow-up tele-health visit to limit community exposure during the Covid-19 pandemic. I spoke with the patient via phone to ensure availability of phone/video source, confirm preferred email & phone number, and discuss instructions and expectations.  I reminded  Sarah Stevenson to be prepared with any vital sign and/or heart rhythm information that could potentially be obtained via home monitoring, at the time of her visit. I reminded Sarah Stevenson to expect a phone call prior to her visit.  [name] L, LPN 1/0/2725 3:66 AM   INSTRUCTIONS FOR DOWNLOADING THE MYCHART APP TO SMARTPHONE  - The patient must first make sure to have activated MyChart and know their login information - If Apple, go to [employer] and type in MyChart in the search bar and download the app. If Android, ask patient to go to [employer] and type in  [place] in the search bar and download the app. The app is free but as with any other app downloads, their phone may require them to verify saved payment information or Apple/Android password.  - The patient will need to then log into the app with their MyChart username and password, and select [facility] as their healthcare provider to link the account. When it is time for your visit, go to the MyChart app, find appointments, and click Begin Video Visit. Be sure to Select Allow for your device to access the Microphone and Camera for your visit. You will then be connected, and your provider will be with you shortly.  **If they have any issues connecting, or need assistance please contact MyChart service desk [phone])**  **If using a computer, in order to ensure the best quality for their visit they will need to use either of the following Internet Browsers: [employer], or Google Chrome**  IF USING DOXIMITY or DOXY.ME - The patient will receive a link just prior to their visit by text.     FULL LENGTH CONSENT FOR TELE-HEALTH VISIT   I hereby voluntarily request, consent and authorize [place] and its employed or contracted physicians, physician assistants, nurse practitioners or other licensed health care professionals (the Practitioner), to provide me with telemedicine health care services (the "Services") as deemed necessary by the treating Practitioner. I acknowledge and consent to receive the Services by the Practitioner via telemedicine. I understand that the telemedicine visit will involve communicating with the Practitioner through live audiovisual communication technology and the disclosure of certain medical information by electronic transmission. I acknowledge that I have been given the opportunity to request an in-person assessment or other available alternative prior to the telemedicine visit and am voluntarily participating in the telemedicine visit.  I  understand that I have the right to withhold or withdraw my consent to the use of telemedicine in the course of my care at any time, without affecting my right to future care or treatment, and that the Practitioner or I may terminate the telemedicine visit at any time. I understand that I have the right to inspect all information obtained and/or recorded in the course of the telemedicine visit and may receive copies of available information for a reasonable fee.  I understand that some of the potential risks of receiving the Services via telemedicine include:  Marland Kitchen Delay or interruption in medical evaluation due to technological equipment failure or disruption; . Information transmitted may not be sufficient (e.g. poor resolution of images) to allow for appropriate medical decision making by the Practitioner; and/or  . In rare instances, security protocols could fail, causing a breach of personal health information.  Furthermore, I acknowledge that it is my responsibility to provide information about my medical history, conditions and care that is complete and accurate to the best of my ability.  I acknowledge that Practitioner's advice, recommendations, and/or decision may be based on factors not within their control, such as incomplete or inaccurate data provided by me or distortions of diagnostic images or specimens that may result from electronic transmissions. I understand that the practice of medicine is not an exact science and that Practitioner makes no warranties or guarantees regarding treatment outcomes. I acknowledge that I will receive a copy of this consent concurrently upon execution via email to the email address I last provided but may also request a printed copy by calling the office of [facility].    I understand that my insurance will be billed for this visit.   I have read or had this consent read to me. . I understand the contents of this consent, which adequately explains the  benefits and risks of the Services being provided via telemedicine.  . I have been provided ample opportunity to ask questions regarding this consent and the Services and have had my questions answered to my satisfaction. . I give my informed consent for the services to be provided through the use of telemedicine in my medical care  By participating in this telemedicine visit I agree to the above.

## 2019-01-09 ENCOUNTER — Ambulatory Visit (HOSPITAL_COMMUNITY)
Admission: RE | Admit: 2019-01-09 | Discharge: 2019-01-09 | Disposition: A | Discharge status: Home / Self Care | Payer: 59 | Source: Ambulatory Visit | Attending: Internal Medicine | Admitting: Internal Medicine

## 2019-01-09 ENCOUNTER — Encounter (HOSPITAL_COMMUNITY): Payer: Self-pay

## 2019-01-09 ENCOUNTER — Ambulatory Visit (HOSPITAL_COMMUNITY): Admission: RE | Admit: 2019-01-09 | Payer: 59 | Source: Ambulatory Visit

## 2019-01-09 ENCOUNTER — Other Ambulatory Visit: Payer: Self-pay

## 2019-01-09 ENCOUNTER — Inpatient Hospital Stay: Payer: 59 | Attending: Internal Medicine

## 2019-01-09 DIAGNOSIS — M1712 Unilateral primary osteoarthritis, left knee: Secondary | ICD-10-CM | POA: Diagnosis not present

## 2019-01-09 DIAGNOSIS — C3490 Malignant neoplasm of unspecified part of unspecified bronchus or lung: Secondary | ICD-10-CM | POA: Diagnosis not present

## 2019-01-09 DIAGNOSIS — Z79899 Other long term (current) drug therapy: Secondary | ICD-10-CM | POA: Insufficient documentation

## 2019-01-09 DIAGNOSIS — Z888 Allergy status to other drugs, medicaments and biological substances status: Secondary | ICD-10-CM | POA: Diagnosis not present

## 2019-01-09 DIAGNOSIS — I252 Old myocardial infarction: Secondary | ICD-10-CM | POA: Insufficient documentation

## 2019-01-09 DIAGNOSIS — Z886 Allergy status to analgesic agent status: Secondary | ICD-10-CM | POA: Diagnosis not present

## 2019-01-09 DIAGNOSIS — R918 Other nonspecific abnormal finding of lung field: Secondary | ICD-10-CM | POA: Diagnosis not present

## 2019-01-09 DIAGNOSIS — C3412 Malignant neoplasm of upper lobe, left bronchus or lung: Secondary | ICD-10-CM | POA: Insufficient documentation

## 2019-01-09 DIAGNOSIS — I2510 Atherosclerotic heart disease of native coronary artery without angina pectoris: Secondary | ICD-10-CM | POA: Insufficient documentation

## 2019-01-09 DIAGNOSIS — M1612 Unilateral primary osteoarthritis, left hip: Secondary | ICD-10-CM | POA: Diagnosis not present

## 2019-01-09 DIAGNOSIS — E119 Type 2 diabetes mellitus without complications: Secondary | ICD-10-CM | POA: Diagnosis not present

## 2019-01-09 DIAGNOSIS — I700 Atherosclerosis of aorta: Secondary | ICD-10-CM | POA: Insufficient documentation

## 2019-01-09 HISTORY — DX: Malignant (primary) neoplasm, unspecified: C80.1

## 2019-01-09 LAB — CMP (CANCER CENTER ONLY)
ALT: 14 U/L (ref 0–44)
AST: 16 U/L (ref 15–41)
Albumin: 3.7 g/dL (ref 3.5–5.0)
Alkaline Phosphatase: 109 U/L (ref 38–126)
Anion gap: 10 (ref 5–15)
BUN: 11 mg/dL (ref 8–23)
CO2: 26 mmol/L (ref 22–32)
Calcium: 9.2 mg/dL (ref 8.9–10.3)
Chloride: 104 mmol/L (ref 98–111)
Creatinine: 1.04 mg/dL — ABNORMAL HIGH (ref 0.44–1.00)
GFR, Est AFR Am: >60 mL/min (ref >60)
GFR, Estimated: 58 mL/min — ABNORMAL LOW (ref >60)
Glucose, Bld: 191 mg/dL — ABNORMAL HIGH (ref 70–99)
Potassium: 4.5 mmol/L (ref 3.5–5.1)
Sodium: 140 mmol/L (ref 135–145)
Total Bilirubin: 0.5 mg/dL (ref 0.3–1.2)
Total Protein: 7.3 g/dL (ref 6.5–8.1)

## 2019-01-09 LAB — CBC WITH DIFFERENTIAL (CANCER CENTER ONLY)
Abs Immature Granulocytes: 0.01 10*3/uL (ref 0.00–0.07)
Basophils Absolute: 0.1 10*3/uL (ref 0.0–0.1)
Basophils Relative: 1 %
Eosinophils Absolute: 0.2 10*3/uL (ref 0.0–0.5)
Eosinophils Relative: 2 %
HCT: 38.4 % (ref 36.0–46.0)
Hemoglobin: 12.3 g/dL (ref 12.0–15.0)
Immature Granulocytes: 0 %
Lymphocytes Relative: 37 %
Lymphs Abs: 2.6 10*3/uL (ref 0.7–4.0)
MCH: 27.9 pg (ref 26.0–34.0)
MCHC: 32 g/dL (ref 30.0–36.0)
MCV: 87.1 fL (ref 80.0–100.0)
Monocytes Absolute: 0.7 10*3/uL (ref 0.1–1.0)
Monocytes Relative: 9 %
Neutro Abs: 3.6 10*3/uL (ref 1.7–7.7)
Neutrophils Relative %: 51 %
Platelet Count: 221 10*3/uL (ref 150–400)
RBC: 4.41 MIL/uL (ref 3.87–5.11)
RDW: 13.3 % (ref 11.5–15.5)
WBC Count: 7.1 10*3/uL (ref 4.0–10.5)
nRBC: 0 % (ref 0.0–0.2)

## 2019-01-09 MED ORDER — SODIUM CHLORIDE (PF) 0.9 % IJ SOLN
INTRAMUSCULAR | Status: AC
  Filled 2019-01-09: qty 50

## 2019-01-09 MED ORDER — IOHEXOL 300 MG/ML  SOLN
75.0000 mL | Freq: Once | INTRAMUSCULAR | Status: AC | PRN
  Administered 2019-01-09: 75 mL via INTRAVENOUS

## 2019-01-10 NOTE — Progress Notes (Signed)
Virtual Visit via Video Note   This visit type was conducted due to national recommendations for restrictions regarding the COVID-19 Pandemic (e.g. social distancing) in an effort to limit this patient's exposure and mitigate transmission in our community.  Due to her co-morbid illnesses, this patient is at least at moderate risk for complications without adequate follow up.  This format is felt to be most appropriate for this patient at this time.  All issues noted in this document were discussed and addressed.  A limited physical exam was performed with this format.  Please refer to the patient's chart for her consent to telehealth for [facility].   Date:  01/10/2019   ID:  Sarah Stevenson, DOB March 31, 1956, MRN [id number]  Patient Location: Home Provider Location: Office  PCP:  Christain Sacramento, MD  Cardiologist:  Sinclair Grooms, MD  Electrophysiologist:  None   Evaluation Performed:  Follow-Up Visit  Chief Complaint:  AVR/CAD  History of Present Illness:   Sarah Stevenson is a 63 y.o. female with CAD with prior RCA DES and severe diagonal #2 disease, and aortic stenosis treated with CABG and AVR. Found to have lung cancer at time of heart surgery.  Overall, Sarah Stevenson is doing well.  She continues to have exertional dyspnea that limits aerobic activity.  She denies orthopnea, lower extremity swelling, chest pain, and palpitations.  Her initial ischemic events were more dyspnea on exertion and burning chest than actual pain.  She has had no chest burning.  She is able to mow her grass although she has to rest.  She is walking without great limitations.  Dizziness that she was was having has resolved.  She is diabetic, losartan was discontinued because of dizziness, and she raises a question about whether she should be back on an ARB for kidney protection.  No neurological complaints.  The patient does not have symptoms concerning for COVID-19 infection (fever, chills, cough, or new  shortness of breath).    Past Medical History:  Diagnosis Date   Anginal pain ([place])    Aortic stenosis    Coronary artery disease    Depression    "mild" (08/06/2016)   Dyspnea    Hyperlipidemia    lung ca dx'd 05/2018   Murmur, cardiac    Myocardial infarction [facility]) 2016   Osteoarthritis    "left hip and knee" (08/06/2016)   Type II diabetes mellitus ([place])    Past Surgical History:  Procedure Laterality Date   AORTIC VALVE REPLACEMENT N/A 05/17/2018   Procedure: AORTIC VALVE REPLACEMENT (AVR) USING 21 MM MAGNA EASE PERICARDIAL BIOPROSTHESIS. MODEL # [id number]. SERIAL # [id number];  Surgeon: Grace Isaac, MD;  Location: [place];  Service: Open Heart Surgery;  Laterality: N/A;   CARDIAC CATHETERIZATION Right 11/30/2014   Procedure: RIGHT/LEFT HEART CATH AND CORONARY ANGIOGRAPHY;  Surgeon: Belva Crome, MD;  Location: [facility] CATH LAB;  Service: Cardiovascular;  Laterality: Right;   CARDIAC CATHETERIZATION N/A 12/13/2015   Procedure: Left Heart Cath and Coronary Angiography;  Surgeon: Leonie Man, MD;  Location: [place] CV LAB;  Service: Cardiovascular;  Laterality: N/A;   CARDIAC CATHETERIZATION N/A 12/13/2015   Procedure: Intravascular Pressure Wire/FFR Study;  Surgeon: Leonie Man, MD;  Location: [place] CV LAB;  Service: Cardiovascular;  Laterality: N/A;  RCA   CARDIAC CATHETERIZATION N/A 08/06/2016   Procedure: Right/Left Heart Cath and Coronary Angiography;  Surgeon: Lorretta Harp, MD;  Location: [place] CV LAB;  Service: Cardiovascular;  Laterality: N/A;   CARDIAC CATHETERIZATION N/A 08/06/2016   Procedure: Coronary Stent Intervention;  Surgeon: Lorretta Harp, MD;  Location: [place] CV LAB;  Service: Cardiovascular;  Laterality: N/A;   distal rca 3.0x16 and 3.0x8 synergy   [place] WITH STENT PLACEMENT  08/06/2016   "2 stents"   CORONARY ARTERY BYPASS GRAFT N/A 05/17/2018   Procedure: CORONARY ARTERY  BYPASS GRAFTING (CABG) X 2 WITH ENDOSCOPIC HARVESTING OF RIGHT SAPHENOUS VEIN. LIMA TO LAD. SVG TO RCA;  Surgeon: Grace Isaac, MD;  Location: [place];  Service: Open Heart Surgery;  Laterality: N/A;   HYSTEROSCOPY DIAGNOSTIC  1990s   JOINT REPLACEMENT     KNEE ARTHROSCOPY Left 1996   NASAL SINUS SURGERY  1977   RIGHT/LEFT HEART CATH AND CORONARY ANGIOGRAPHY N/A 04/01/2018   Procedure: RIGHT/LEFT HEART CATH AND CORONARY ANGIOGRAPHY;  Surgeon: Belva Crome, MD;  Location: [place] CV LAB;  Service: Cardiovascular;  Laterality: N/A;   TEE WITHOUT CARDIOVERSION N/A 05/17/2018   Procedure: TRANSESOPHAGEAL ECHOCARDIOGRAM (TEE);  Surgeon: Grace Isaac, MD;  Location: [place];  Service: Open Heart Surgery;  Laterality: N/A;   TOTAL HIP ARTHROPLASTY Left 04/23/2017   Procedure: LEFT TOTAL HIP ARTHROPLASTY ANTERIOR APPROACH;  Surgeon: Mcarthur Rossetti, MD;  [facility];  Service: Orthopedics;  Laterality: Left;   WEDGE RESECTION Left 05/17/2018   Procedure: LEFT UPPER LOBE LUNG WEDGE RESECTION;  Surgeon: Grace Isaac, MD;  Location: [place];  Service: Open Heart Surgery;  Laterality: Left;     No outpatient medications have been marked as taking for the 01/11/19 encounter (Appointment) with Belva Crome, MD.     Allergies:   Brilinta [ticagrelor]; Lisinopril; Asa [aspirin]; Blue dyes (parenteral); Corn-containing products; Nsaids; Sugar-protein-starch; Tea; Isosulfan blue; and Other   Social History   Tobacco Use   Smoking status: Former Smoker    Packs/day: 1.00    Years: 10.00    Pack years: 10.00    Types: Cigarettes    Last attempt to quit: 1982    Years since quitting: 38.3   Smokeless tobacco: Never Used   Tobacco comment: restarted  about 5-6 yrs ago, then stopped  Substance Use Topics   Alcohol use: Yes    Alcohol/week: 7.0 standard drinks    Types: 7 Shots of liquor per week    Comment: weekly   Drug use: No     Family Hx: The patient's  family history includes Heart attack in her father and mother; Sudden death in her father. There is no history of Hypertension, Hyperlipidemia, or Diabetes.  ROS:   Please see the history of present illness.    Dyspnea as mentioned.  Dizziness has resolved.  Blood pressures have been stable.  She has changed her diet and is more Mediterranean heading towards plant-based. All other systems reviewed and are negative.   Prior CV studies:   The following studies were reviewed today:  No new imaging data.  Most recent lipid panel noted below revealed an LDL that is acceptable.  We never started PCSK9 therapy.  Labs/Other Tests and Data Reviewed:    EKG:  No ECG reviewed.  Recent Labs: 05/18/2018: Magnesium 2.3 01/09/2019: ALT 14; BUN 11; Creatinine 1.04; Hemoglobin 12.3; Platelet Count 221; Potassium 4.5; Sodium 140   Recent Lipid Panel Lab Results  Component Value Date/Time   CHOL 109 11/21/2018 10:21 AM   TRIG 119 11/21/2018 10:21 AM   HDL 23 (L)  11/21/2018 10:21 AM   CHOLHDL 4.7 (H) 11/21/2018 10:21 AM   CHOLHDL 4.8 12/01/2014 04:25 AM   LDLCALC 62 11/21/2018 10:21 AM    Wt Readings from Last 3 Encounters:  09/29/18 194 lb (88 kg)  09/05/18 198 lb 13.7 oz (90.2 kg)  08/29/18 197 lb (89.4 kg)     Objective:    Vital Signs:  There were no vitals taken for this visit.   VITAL SIGNS:  reviewed  ASSESSMENT & PLAN:    1. Coronary artery disease involving coronary bypass graft of native heart with angina pectoris ([place])   2. H/O aortic valve replacement   3. Mixed hyperlipidemia   4. Adenocarcinoma of lung, left ([place])   5. Type 2 diabetes mellitus with complication (HCC)   6. Type 2 diabetes mellitus with complication, with long-term current use of insulin ([place])   7. 2019 novel coronavirus disease (COVID-19)    PLAN;  1. Secondary risk factor modification reviewed.  Encouraged that she stay with aerobic activity as this greatly enhances risk factor control and decreases the  number of medications that she will require. 2. Follow-up 2D Doppler echocardiogram will be done on the anniversary of her surgery in September.  This will establish a baseline for future reference. 3. LDL target less than 70 and I would love if we could get to 55 or lower.  She is tolerating every other day statin therapy but still has some muscle aches.  We may yet need PCSK9 therapy or Inclisiran (siRNA=small inhibiting RNA moderate molecule) when he becomes available. 4. Not addressed but did review the results of a recently performed chest CT which is stable 5. LDL target less than 70 and preferably less than 6.5 if possible.  Consider SGLT2 therapy.  Overall education and awareness concerning primary/secondary risk prevention was discussed in detail: LDL less than 70, hemoglobin A1c less than 7, blood pressure target less than 130/80 mmHg, >150 minutes of moderate aerobic activity per week, avoidance of smoking, weight control (via diet and exercise), and continued surveillance/management of/for obstructive sleep apnea.   COVID-19 Education: The signs and symptoms of COVID-19 were discussed with the patient and how to seek care for testing (follow up with PCP or arrange E-visit).  The importance of social distancing was discussed today.  Time:   Today, I have spent 15 minutes with the patient with telehealth technology discussing the above problems.     Medication Adjustments/Labs and Tests Ordered: Current medicines are reviewed at length with the patient today.  Concerns regarding medicines are outlined above.   Tests Ordered: No orders of the defined types were placed in this encounter.   Medication Changes: No orders of the defined types were placed in this encounter.   Disposition:  Follow up in 6 month(s)  Signed, Sinclair Grooms, MD  01/10/2019 9:03 PM    [facility]

## 2019-01-11 ENCOUNTER — Other Ambulatory Visit: Payer: Self-pay

## 2019-01-11 ENCOUNTER — Telehealth (INDEPENDENT_AMBULATORY_CARE_PROVIDER_SITE_OTHER): Payer: 59 | Admitting: Interventional Cardiology

## 2019-01-11 ENCOUNTER — Encounter: Payer: Self-pay | Admitting: Interventional Cardiology

## 2019-01-11 VITALS — BP 127/77 | HR 86 | Temp 96.5°F | Ht 62.0 in | Wt 192.0 lb

## 2019-01-11 DIAGNOSIS — I25709 Atherosclerosis of coronary artery bypass graft(s), unspecified, with unspecified angina pectoris: Secondary | ICD-10-CM | POA: Diagnosis not present

## 2019-01-11 DIAGNOSIS — E782 Mixed hyperlipidemia: Secondary | ICD-10-CM

## 2019-01-11 DIAGNOSIS — U071 COVID-19: Secondary | ICD-10-CM

## 2019-01-11 DIAGNOSIS — Z952 Presence of prosthetic heart valve: Secondary | ICD-10-CM

## 2019-01-11 DIAGNOSIS — Z794 Long term (current) use of insulin: Secondary | ICD-10-CM

## 2019-01-11 DIAGNOSIS — E118 Type 2 diabetes mellitus with unspecified complications: Secondary | ICD-10-CM

## 2019-01-11 DIAGNOSIS — C3492 Malignant neoplasm of unspecified part of left bronchus or lung: Secondary | ICD-10-CM

## 2019-01-11 MED ORDER — LOSARTAN POTASSIUM 25 MG PO TABS: 25.0000 mg | ORAL_TABLET | Freq: Every day | ORAL | 3 refills | Status: DC

## 2019-01-11 MED FILL — LOSARTAN POTASSIUM 25 MG TA: 25 | 90 days supply | Qty: 90 | Fill #0

## 2019-01-11 NOTE — Patient Instructions (Signed)
Medication Instructions:  1) START Losartan 25mg  once daily  If you need a refill on your cardiac medications before your next appointment, please call your pharmacy.   Lab work: None If you have labs (blood work) drawn today and your tests are completely normal, you will receive your results only by: Marland Kitchen MyChart Message (if you have MyChart) OR . A paper copy in the mail If you have any lab test that is abnormal or we need to change your treatment, we will call you to review the results.  Testing/Procedures: Your physician has requested that you have an echocardiogram in September. Echocardiography is a painless test that uses sound waves to create images of your heart. It provides your doctor with information about the size and shape of your heart and how well your heart's chambers and valves are working. This procedure takes approximately one hour. There are no restrictions for this procedure.    Follow-Up: At [facility], you and your health needs are our priority.  As part of our continuing mission to provide you with exceptional heart care, we have created designated Provider Care Teams.  These Care Teams include your primary Cardiologist (physician) and Advanced Practice Providers (APPs -  Physician Assistants and Nurse Practitioners) who all work together to provide you with the care you need, when you need it. You will need a follow up appointment in 6 months.  Please call our office 2 months in advance to schedule this appointment.  You may see Sinclair Grooms, MD or one of the following Advanced Practice Providers on your designated Care Team:   Truitt Merle, NP Cecilie Kicks, NP . Kathyrn Drown, NP  Any Other Special Instructions Will Be Listed Below (If Applicable).

## 2019-01-12 ENCOUNTER — Inpatient Hospital Stay (HOSPITAL_BASED_OUTPATIENT_CLINIC_OR_DEPARTMENT_OTHER): Payer: 59 | Admitting: Internal Medicine

## 2019-01-12 ENCOUNTER — Encounter: Payer: Self-pay | Admitting: Internal Medicine

## 2019-01-12 ENCOUNTER — Other Ambulatory Visit: Payer: Self-pay

## 2019-01-12 VITALS — BP 118/66 | HR 83 | Temp 97.8°F | Resp 18 | Ht 62.0 in | Wt 199.1 lb

## 2019-01-12 DIAGNOSIS — Z79899 Other long term (current) drug therapy: Secondary | ICD-10-CM

## 2019-01-12 DIAGNOSIS — Z736 Limitation of activities due to disability: Secondary | ICD-10-CM

## 2019-01-12 DIAGNOSIS — M1612 Unilateral primary osteoarthritis, left hip: Secondary | ICD-10-CM | POA: Diagnosis not present

## 2019-01-12 DIAGNOSIS — C3412 Malignant neoplasm of upper lobe, left bronchus or lung: Secondary | ICD-10-CM | POA: Diagnosis not present

## 2019-01-12 DIAGNOSIS — M162 Bilateral osteoarthritis resulting from hip dysplasia: Secondary | ICD-10-CM | POA: Diagnosis not present

## 2019-01-12 DIAGNOSIS — Z888 Allergy status to other drugs, medicaments and biological substances status: Secondary | ICD-10-CM

## 2019-01-12 DIAGNOSIS — E119 Type 2 diabetes mellitus without complications: Secondary | ICD-10-CM | POA: Diagnosis not present

## 2019-01-12 DIAGNOSIS — M1712 Unilateral primary osteoarthritis, left knee: Secondary | ICD-10-CM | POA: Diagnosis not present

## 2019-01-12 DIAGNOSIS — I252 Old myocardial infarction: Secondary | ICD-10-CM | POA: Diagnosis not present

## 2019-01-12 DIAGNOSIS — Z886 Allergy status to analgesic agent status: Secondary | ICD-10-CM

## 2019-01-12 DIAGNOSIS — C3490 Malignant neoplasm of unspecified part of unspecified bronchus or lung: Secondary | ICD-10-CM

## 2019-01-12 DIAGNOSIS — I700 Atherosclerosis of aorta: Secondary | ICD-10-CM

## 2019-01-12 DIAGNOSIS — I2510 Atherosclerotic heart disease of native coronary artery without angina pectoris: Secondary | ICD-10-CM

## 2019-01-12 DIAGNOSIS — C3492 Malignant neoplasm of unspecified part of left bronchus or lung: Secondary | ICD-10-CM

## 2019-01-12 NOTE — Progress Notes (Signed)
[place] [phone]   [phone]  OFFICE PROGRESS NOTE  Christain Sacramento, MD [address] [place] [address]  DIAGNOSIS: Stage IA (T1 a, Nx, Mx) non-small cell lung cancer, adenocarcinoma measuring 1.2 cm   PRIOR THERAPY: Status post wedge resection of the left upper lobe on May 24, 2018 under the care of Dr. Servando Snare.  CURRENT THERAPY: Observation.  INTERVAL HISTORY: Sarah Stevenson 63 y.o. female returns to the clinic today for 6 months follow-up visit.  The patient is feeling fine today with no concerning complaints except for uncontrolled diabetes recently.  The patient denied having any current chest pain, shortness of breath, cough or hemoptysis.  She denied having any fever or chills.  She has no nausea, vomiting, diarrhea or constipation.  She has no headache or visual changes.  She had repeat CT scan of the chest performed recently and she is here for evaluation and discussion of her scan results.  MEDICAL HISTORY: Past Medical History:  Diagnosis Date  . Anginal pain ([place])   . Aortic stenosis   . Coronary artery disease   . Depression    "mild" (08/06/2016)  . Dyspnea   . Hyperlipidemia   . lung ca dx'd 05/2018  . Murmur, cardiac   . Myocardial infarction ([place]) 2016  . Osteoarthritis    "left hip and knee" (08/06/2016)  . Type II diabetes mellitus (HCC)     ALLERGIES:  is allergic to brilinta [ticagrelor]; lisinopril; asa [aspirin]; blue dyes (parenteral); corn-containing products; nsaids; sugar-protein-starch; tea; isosulfan blue; and other.  MEDICATIONS:  Current Outpatient Medications  Medication Sig Dispense Refill  . acetaminophen (TYLENOL) 500 MG tablet Take 500-1,000 mg by mouth 2 (two) times daily as needed for moderate pain.    . carvedilol (COREG) 6.25 MG tablet Take 1 tablet (6.25 mg total) by mouth 2 (two) times daily with a meal. 180 tablet 3  . cetirizine (ZYRTEC) 10 MG tablet Take 10 mg by  mouth daily.    Marland Kitchen ezetimibe (ZETIA) 10 MG tablet Take 1 tablet (10 mg total) by mouth daily. 90 tablet 3  . famotidine (PEPCID) 20 MG tablet Take 20 mg by mouth at bedtime.    . fluticasone (FLONASE) 50 MCG/ACT nasal spray Place 1 spray into both nostrils daily as needed for allergies or rhinitis.    Marland Kitchen glipiZIDE (GLUCOTROL XL) 10 MG 24 hr tablet Take 10 mg by mouth daily with breakfast.     . losartan (COZAAR) 25 MG tablet Take 1 tablet (25 mg total) by mouth daily. 90 tablet 3  . Menthol-Methyl Salicylate (MUSCLE RUB EX) Apply 1 application topically as needed (for pain).    . metFORMIN (GLUCOPHAGE-XR) 750 MG 24 hr tablet Take 1,500 mg by mouth every evening.    . prasugrel (EFFIENT) 10 MG TABS tablet Take 1 tablet (10 mg total) by mouth daily. 90 tablet 2  . rosuvastatin (CRESTOR) 10 MG tablet Take 10 mg by mouth 3 (three) times a week.    . sertraline (ZOLOFT) 50 MG tablet Take 25 mg by mouth daily.     . TRULICITY 6.64 QI/3.4VQ SOPN Inject 0.75 mg into the skin once a week.   3  . TURMERIC CURCUMIN PO Take 1,200 mg by mouth daily.      No current facility-administered medications for this visit.     SURGICAL HISTORY:  Past Surgical History:  Procedure Laterality Date  . AORTIC VALVE REPLACEMENT N/A 05/17/2018  Procedure: AORTIC VALVE REPLACEMENT (AVR) USING 21 MM MAGNA EASE PERICARDIAL BIOPROSTHESIS. MODEL # 3300TFX. SERIAL # [id number];  Surgeon: Grace Isaac, MD;  Location: [place];  Service: Open Heart Surgery;  Laterality: N/A;  . CARDIAC CATHETERIZATION Right 11/30/2014   Procedure: RIGHT/LEFT HEART CATH AND CORONARY ANGIOGRAPHY;  Surgeon: Belva Crome, MD;  Location: [facility] CATH LAB;  Service: Cardiovascular;  Laterality: Right;  . CARDIAC CATHETERIZATION N/A 12/13/2015   Procedure: Left Heart Cath and Coronary Angiography;  Surgeon: Leonie Man, MD;  Location: [place] CV LAB;  Service: Cardiovascular;  Laterality: N/A;  . CARDIAC CATHETERIZATION N/A 12/13/2015   Procedure:  Intravascular Pressure Wire/FFR Study;  Surgeon: Leonie Man, MD;  Location: [place] CV LAB;  Service: Cardiovascular;  Laterality: N/A;  RCA  . CARDIAC CATHETERIZATION N/A 08/06/2016   Procedure: Right/Left Heart Cath and Coronary Angiography;  Surgeon: Lorretta Harp, MD;  Location: [place] CV LAB;  Service: Cardiovascular;  Laterality: N/A;  . CARDIAC CATHETERIZATION N/A 08/06/2016   Procedure: Coronary Stent Intervention;  Surgeon: Lorretta Harp, MD;  Location: [place] CV LAB;  Service: Cardiovascular;  Laterality: N/A;   distal rca 3.0x16 and 3.0x8 synergy  . CESAREAN SECTION  1989  . CORONARY ANGIOPLASTY WITH STENT PLACEMENT  08/06/2016   "2 stents"  . CORONARY ARTERY BYPASS GRAFT N/A 05/17/2018   Procedure: CORONARY ARTERY BYPASS GRAFTING (CABG) X 2 WITH ENDOSCOPIC HARVESTING OF RIGHT SAPHENOUS VEIN. LIMA TO LAD. SVG TO RCA;  Surgeon: Grace Isaac, MD;  Location: [place];  Service: Open Heart Surgery;  Laterality: N/A;  . HYSTEROSCOPY DIAGNOSTIC  1990s  . JOINT REPLACEMENT    . KNEE ARTHROSCOPY Left 1996  . NASAL SINUS SURGERY  1977  . RIGHT/LEFT HEART CATH AND CORONARY ANGIOGRAPHY N/A 04/01/2018   Procedure: RIGHT/LEFT HEART CATH AND CORONARY ANGIOGRAPHY;  Surgeon: Belva Crome, MD;  Location: [place] CV LAB;  Service: Cardiovascular;  Laterality: N/A;  . TEE WITHOUT CARDIOVERSION N/A 05/17/2018   Procedure: TRANSESOPHAGEAL ECHOCARDIOGRAM (TEE);  Surgeon: Grace Isaac, MD;  Location: [place];  Service: Open Heart Surgery;  Laterality: N/A;  . TOTAL HIP ARTHROPLASTY Left 04/23/2017   Procedure: LEFT TOTAL HIP ARTHROPLASTY ANTERIOR APPROACH;  Surgeon: Mcarthur Rossetti, MD;  [facility];  Service: Orthopedics;  Laterality: Left;  . WEDGE RESECTION Left 05/17/2018   Procedure: LEFT UPPER LOBE LUNG WEDGE RESECTION;  Surgeon: Grace Isaac, MD;  Location: [place];  Service: Open Heart Surgery;  Laterality: Left;    REVIEW OF SYSTEMS:  A  comprehensive review of systems was negative.   PHYSICAL EXAMINATION: General appearance: alert, cooperative and no distress Head: Normocephalic, without obvious abnormality, atraumatic Neck: no adenopathy, no JVD, supple, symmetrical, trachea midline and thyroid not enlarged, symmetric, no tenderness/mass/nodules Lymph nodes: Cervical, supraclavicular, and axillary nodes normal. Resp: clear to auscultation bilaterally Back: symmetric, no curvature. ROM normal. No CVA tenderness. Cardio: regular rate and rhythm, S1, S2 normal, no murmur, click, rub or gallop GI: soft, non-tender; bowel sounds normal; no masses,  no organomegaly Extremities: extremities normal, atraumatic, no cyanosis or edema  ECOG PERFORMANCE STATUS: 1 - Symptomatic but completely ambulatory  Blood pressure 118/66, pulse 83, temperature 97.8 F (36.6 C), temperature source Oral, resp. rate 18, height 5\' 2"  (1.575 m), weight 199 lb 1.6 oz (90.3 kg), SpO2 98 %.  LABORATORY DATA: Lab Results  Component Value Date   WBC 7.1 01/09/2019   HGB 12.3 01/09/2019   HCT 38.4  01/09/2019   MCV 87.1 01/09/2019   PLT 221 01/09/2019      Chemistry      Component Value Date/Time   NA 140 01/09/2019 1118   NA 139 06/08/2018 1043   K 4.5 01/09/2019 1118   CL 104 01/09/2019 1118   CO2 26 01/09/2019 1118   BUN 11 01/09/2019 1118   BUN 17 06/08/2018 1043   CREATININE 1.04 (H) 01/09/2019 1118      Component Value Date/Time   CALCIUM 9.2 01/09/2019 1118   ALKPHOS 109 01/09/2019 1118   AST 16 01/09/2019 1118   ALT 14 01/09/2019 1118   BILITOT 0.5 01/09/2019 1118       RADIOGRAPHIC STUDIES: Ct Chest W Contrast  Result Date: 01/10/2019 CLINICAL DATA:  Non-small-cell lung cancer of the left upper lobe. Status post wedge resection. EXAM: CT CHEST WITH CONTRAST TECHNIQUE: Multidetector CT imaging of the chest was performed during intravenous contrast administration. CONTRAST:  63mL OMNIPAQUE IOHEXOL 300 MG/ML  SOLN COMPARISON:   PET-CT 06/29/2018. FINDINGS: Cardiovascular: The heart size is normal. No substantial pericardial effusion. Status post AVR. Coronary artery calcification is evident. Atherosclerotic calcification is noted in the wall of the thoracic aorta. Mediastinum/Nodes: No mediastinal lymphadenopathy. There is no hilar lymphadenopathy. The esophagus has normal imaging features. There is no axillary lymphadenopathy. Lungs/Pleura: The central tracheobronchial airways are patent. 4 mm right upper lobe subpleural nodule is unchanged in the interval. Stable 5 mm ground-glass nodule in the lateral right lung (67/7). Architectural distortion and scarring noted left upper lobe with associated staple line. Soft tissue attenuation associated with the staple line has decreased in the interval. No new suspicious pulmonary nodule or mass on today's study. No pleural effusion. Scarring noted medial right lower lobe. Upper Abdomen: Small focus of subcapsular enhancement in the posterior right liver (142/2) is stable since 04/15/2018. This is probably a vascular malformation. Cortical scarring noted in the incompletely visualized kidneys. Musculoskeletal: No worrisome lytic or sclerotic osseous abnormality. IMPRESSION: 1. Interval decrease in the volume of soft tissue associated with the left upper lobe surgical site. 2. Stable tiny right lung nodules. Continued attention on follow-up recommended. 3. No change small focus of subcapsular hyper enhancement in the posterior right liver comparing back to 04/15/2018. Probably vascular malformation. This could also be reassessed at the time of follow-up imaging. 4.  Aortic Atherosclerois (ICD10-170.0) Electronically Signed   By: Misty Stanley M.D.   On: 01/10/2019 08:53    ASSESSMENT AND PLAN: This is a very pleasant 63 years old white female diagnosed with a stage Ia non-small cell lung cancer status post wedge resection of the left upper lobe in September 2019 under the care of Dr. Servando Snare.  The patient is doing fine today with no concerning complaints. She had repeat CT scan of the chest performed recently.  I personally and independently reviewed the scan images and showed it to the patient. Her scan showed no concerning findings for disease recurrence. I recommended for her to continue on observation with repeat CT scan of the chest in 6 months. For diabetes mellitus, the patient will try to have better control of her blood sugar. The patient was advised to call immediately if she has any concerning symptoms in the interval. The patient voices understanding of current disease status and treatment options and is in agreement with the current care plan. All questions were answered. The patient knows to call the clinic with any problems, questions or concerns. We can certainly see the patient much sooner  if necessary.  I spent 10 minutes counseling the patient face to face. The total time spent in the appointment was 15 minutes.  Disclaimer: This note was dictated with voice recognition software. Similar sounding words can inadvertently be transcribed and may not be corrected upon review.

## 2019-01-13 ENCOUNTER — Telehealth: Payer: Self-pay | Admitting: Internal Medicine

## 2019-01-13 NOTE — Telephone Encounter (Signed)
Scheduled appt per 5/07 los - pt to get an updated schedule in the mail f/u in 6 months.

## 2019-01-16 ENCOUNTER — Other Ambulatory Visit: Payer: Self-pay | Admitting: Nurse Practitioner

## 2019-01-16 MED FILL — PRASUGREL HCL 10 MG TABS: 10 | 90 days supply | Qty: 90 | Fill #0

## 2019-02-06 DIAGNOSIS — I2510 Atherosclerotic heart disease of native coronary artery without angina pectoris: Secondary | ICD-10-CM | POA: Diagnosis not present

## 2019-02-06 DIAGNOSIS — E782 Mixed hyperlipidemia: Secondary | ICD-10-CM | POA: Diagnosis not present

## 2019-02-06 DIAGNOSIS — E119 Type 2 diabetes mellitus without complications: Secondary | ICD-10-CM | POA: Diagnosis not present

## 2019-02-06 DIAGNOSIS — I10 Essential (primary) hypertension: Secondary | ICD-10-CM | POA: Diagnosis not present

## 2019-02-08 DIAGNOSIS — M2550 Pain in unspecified joint: Secondary | ICD-10-CM | POA: Diagnosis not present

## 2019-02-08 DIAGNOSIS — I10 Essential (primary) hypertension: Secondary | ICD-10-CM | POA: Diagnosis not present

## 2019-02-08 DIAGNOSIS — I2510 Atherosclerotic heart disease of native coronary artery without angina pectoris: Secondary | ICD-10-CM | POA: Diagnosis not present

## 2019-02-08 DIAGNOSIS — E119 Type 2 diabetes mellitus without complications: Secondary | ICD-10-CM | POA: Diagnosis not present

## 2019-02-08 MED FILL — glipiZIDE ER 10 MG TB24: 10 | 90 days supply | Qty: 90 | Fill #1

## 2019-02-08 MED FILL — SERTRALINE HCL 50 MG TABS: 50 | 90 days supply | Qty: 90 | Fill #0

## 2019-02-08 MED FILL — CELECOXIB 200 MG CAP: 200 | 90 days supply | Qty: 90 | Fill #0

## 2019-03-17 MED FILL — TRULICITY 0.75 MG/0.5 ML PE: 0.75 | 84 days supply | Qty: 6 | Fill #3

## 2019-03-20 MED FILL — MetFORMIN HCL 850 MG TAB: 850 | 90 days supply | Qty: 180 | Fill #0

## 2019-03-23 MED FILL — MetFORMIN HCL 850 MG TAB: 850 | 90 days supply | Qty: 180 | Fill #0

## 2019-04-04 ENCOUNTER — Telehealth: Payer: Self-pay | Admitting: Pharmacist

## 2019-04-04 NOTE — Telephone Encounter (Signed)
Called pt to follow up with lipids - she was approved for Repatha in March but never started since LDL was 62 on rosuvastatin 10mg  2-3 days per week. She reports her LDL has since increased to 92 at her PCP and she has increased her rosuvastatin dose to 4-5 days a week since last month. She reports experiencing myalgias, however does have baseline arthritis. She is still taking Zetia. Discussed starting Repatha shots as these have previously been approved by her insurance and are affordable at a $5 copay. Pt is agreeable - she will stop rosuvastatin for 1 week to see if symptoms improve, then will start Repatha shots next week. Will call pt in 1 month to assess tolerability.

## 2019-04-05 ENCOUNTER — Other Ambulatory Visit (HOSPITAL_BASED_OUTPATIENT_CLINIC_OR_DEPARTMENT_OTHER): Payer: Self-pay | Admitting: Family Medicine

## 2019-04-05 DIAGNOSIS — Z1231 Encounter for screening mammogram for malignant neoplasm of breast: Secondary | ICD-10-CM

## 2019-04-05 MED FILL — CARVEDILOL 6.25 MG TABLET: 6.25 | 90 days supply | Qty: 180 | Fill #3

## 2019-04-06 ENCOUNTER — Other Ambulatory Visit: Payer: Self-pay | Admitting: Nurse Practitioner

## 2019-04-07 DIAGNOSIS — R6883 Chills (without fever): Secondary | ICD-10-CM | POA: Diagnosis not present

## 2019-04-07 DIAGNOSIS — R490 Dysphonia: Secondary | ICD-10-CM | POA: Diagnosis not present

## 2019-04-07 DIAGNOSIS — R638 Other symptoms and signs concerning food and fluid intake: Secondary | ICD-10-CM | POA: Diagnosis not present

## 2019-04-07 DIAGNOSIS — R0981 Nasal congestion: Secondary | ICD-10-CM | POA: Diagnosis not present

## 2019-04-07 DIAGNOSIS — J029 Acute pharyngitis, unspecified: Secondary | ICD-10-CM | POA: Diagnosis not present

## 2019-04-07 DIAGNOSIS — J069 Acute upper respiratory infection, unspecified: Secondary | ICD-10-CM | POA: Diagnosis not present

## 2019-04-07 DIAGNOSIS — R51 Headache: Secondary | ICD-10-CM | POA: Diagnosis not present

## 2019-04-07 DIAGNOSIS — R5383 Other fatigue: Secondary | ICD-10-CM | POA: Diagnosis not present

## 2019-04-07 DIAGNOSIS — Z20828 Contact with and (suspected) exposure to other viral communicable diseases: Secondary | ICD-10-CM | POA: Diagnosis not present

## 2019-04-07 DIAGNOSIS — R05 Cough: Secondary | ICD-10-CM | POA: Diagnosis not present

## 2019-04-07 DIAGNOSIS — Z87891 Personal history of nicotine dependence: Secondary | ICD-10-CM | POA: Diagnosis not present

## 2019-04-14 MED FILL — LOSARTAN POTASSIUM 25 MG TA: 25 | 90 days supply | Qty: 90 | Fill #1

## 2019-04-17 ENCOUNTER — Ambulatory Visit (HOSPITAL_BASED_OUTPATIENT_CLINIC_OR_DEPARTMENT_OTHER)
Admission: RE | Admit: 2019-04-17 | Discharge: 2019-04-17 | Disposition: A | Discharge status: Home / Self Care | Payer: 59 | Source: Ambulatory Visit | Attending: Family Medicine | Admitting: Family Medicine

## 2019-04-17 ENCOUNTER — Encounter (HOSPITAL_BASED_OUTPATIENT_CLINIC_OR_DEPARTMENT_OTHER): Payer: Self-pay

## 2019-04-17 ENCOUNTER — Other Ambulatory Visit: Payer: Self-pay

## 2019-04-17 ENCOUNTER — Other Ambulatory Visit: Payer: Self-pay | Admitting: Nurse Practitioner

## 2019-04-17 DIAGNOSIS — Z1231 Encounter for screening mammogram for malignant neoplasm of breast: Secondary | ICD-10-CM | POA: Insufficient documentation

## 2019-04-17 MED ORDER — PRASUGREL HCL 10 MG PO TABS: 10.0000 mg | ORAL_TABLET | Freq: Every day | ORAL | 2 refills | Status: DC

## 2019-04-17 MED FILL — PRASUGREL HCL 10 MG TABS: 10 | 90 days supply | Qty: 90 | Fill #0

## 2019-04-19 NOTE — Telephone Encounter (Signed)
Outpatient Medication Detail   Disp Refills Start End   prasugrel (EFFIENT) 10 MG TABS tablet 90 tablet 2 04/17/2019    Sig - Route: Take 1 tablet (10 mg total) by mouth daily. - Oral   Sent to pharmacy as: prasugrel (EFFIENT) 10 MG Tab tablet   E-Prescribing Status: Receipt confirmed by pharmacy (04/17/2019 4:35 PM EDT)   [place], [place]

## 2019-04-20 ENCOUNTER — Ambulatory Visit (INDEPENDENT_AMBULATORY_CARE_PROVIDER_SITE_OTHER): Payer: 59 | Admitting: Cardiothoracic Surgery

## 2019-04-20 ENCOUNTER — Encounter: Payer: Self-pay | Admitting: Cardiothoracic Surgery

## 2019-04-20 ENCOUNTER — Other Ambulatory Visit: Payer: Self-pay

## 2019-04-20 VITALS — BP 118/61 | HR 79 | Temp 96.8°F | Resp 16 | Ht 62.0 in | Wt 200.0 lb

## 2019-04-20 DIAGNOSIS — Z952 Presence of prosthetic heart valve: Secondary | ICD-10-CM | POA: Diagnosis not present

## 2019-04-20 DIAGNOSIS — I350 Nonrheumatic aortic (valve) stenosis: Secondary | ICD-10-CM | POA: Diagnosis not present

## 2019-04-20 DIAGNOSIS — Z951 Presence of aortocoronary bypass graft: Secondary | ICD-10-CM

## 2019-04-20 DIAGNOSIS — I25119 Atherosclerotic heart disease of native coronary artery with unspecified angina pectoris: Secondary | ICD-10-CM | POA: Diagnosis not present

## 2019-04-20 DIAGNOSIS — C3492 Malignant neoplasm of unspecified part of left bronchus or lung: Secondary | ICD-10-CM | POA: Diagnosis not present

## 2019-04-20 NOTE — Progress Notes (Signed)
[address]       [place] [address]             [phone]                  Sarah Stevenson [facility] Medical Record #[id number] Date of Birth: 07/01/1956  Referring AO:[id number], Marlane Hatcher, NP Primary Cardiology: Primary Care:Sarah Sacramento, MD  Chief Complaint:  Follow Up Visit OPERATIVE REPORT DATE OF PROCEDURE:  05/17/2018 PREOPERATIVE DIAGNOSES:  Severe aortic stenosis and coronary occlusive disease. POSTOPERATIVE DIAGNOSES: 1.  Severe aortic stenosis with a trileaflet aortic valve and coronary occlusive disease. 2.  Aortic stenosis with a trileaflet aortic valve 3.  Coronary occlusive disease. 4.  Left upper lobe adenocarcinoma of the lung. PROCEDURES PERFORMED:   1.  Aortic valve replacement with pericardial tissue valve, [employer] model 3300 TFX 21 mm, serial number [id number]. 2.  Coronary artery bypass grafting x2 with the left internal mammary to the left anterior descending coronary artery and reverse saphenous vein graft to the distal right coronary artery with right thigh endovein harvesting.   3.  Wedge resection, left upper lobe lung lesion.  Diagnosis 1. Lung, wedge biopsy/resection, Left Upper Lobe - ADENOCARCINOMA, WELL DIFFERENTIATED, SPANNING 1.2 CM. - SEE ONCOLOGY TABLE BELOW. 2. Lung, wedge biopsy/resection, Left Upper Lobe - ADENOCARCINOMA. 3. Lung, wedge biopsy/resection, Left Upper Lobe - BENIGN LUNG PARENCHYMA. - THERE IS NO EVIDENCE OF MALIGNANCY. 4. Heart valve leaflets - NODULAR FIBROSIS AND CALCIFICATION. Microscopic Comment 1. LUNG: Procedure: Wedge resection and additional margin resections Specimen Laterality: Left upper lobe Tumor Site: Left upper lobe Tumor Size: 1.2 cm (gross measurement) Tumor Focality: Unifocal Histologic Type: Adenocarcinoma, well differentiated Visceral Pleura Invasion: Not identified. Lymphovascular Invasion: Not identified Direct Invasion of Adjacent Structures: Not  identified. Margins: Greater than 0.2 cm to parenchymal margin Treatment Effect: N/A Regional Lymph Nodes: None examined Pathologic Stage Classification (pTNM, AJCC 8th Edition): pT1a, pNX Ancillary Studies: Can be performed upon clinician request Representative Tumor Block: 1B (JBK:kh 05/19/18)  History of Present Illness: Patient returns to the office in follow-up after aortic valve replacement and coronary artery bypass grafting for symptomatic severe aortic stenosis and coronary artery disease.  A groundglass opacity in the left upper lobe was also noted incidentally on preop CT scan, wedge resection of lung confirmed adenocarcinoma  Clinically this appeared to be stage I adenocarcinoma   Patient has returned to full-time work, denies any definite anginal symptoms.  She notes she has some tingling in her upper arms at times.  Notes when she cuts her grass on the hill with a push more her heart rate will get up to 1 40-1 60 and she has to rest.   She notes that she has stopped her statin therapy and started on Repatha this week    Zubrod Score: At the time of surgery this patient's most appropriate activity status/level should be described as: []     0    Normal activity, no symptoms [x]     1    Restricted in physical strenuous activity but ambulatory, able to do out light work []     2    Ambulatory and capable of self care, unable to do work activities, up and about                 >50 % of waking hours                                                                                   []   3    Only limited self care, in bed greater than 50% of waking hours []     4    Completely disabled, no self care, confined to bed or chair []     5    Moribund  Social History   Tobacco Use  Smoking Status Former Smoker  . Packs/day: 1.00  . Years: 10.00  . Pack years: 10.00  . Types: Cigarettes  . Quit date: 70  . Years since quitting: 38.6  Smokeless Tobacco Never Used  Tobacco Comment    restarted  about 5-6 yrs ago, then stopped       Allergies  Allergen Reactions  . Brilinta [Ticagrelor] Shortness Of Breath  . Lisinopril Cough  . Asa [Aspirin] Hives and Rash  . Blue Dyes (Parenteral) Hives  . Corn-Containing Products Hives  . Nsaids Hives and Rash  . Sugar-Protein-Starch Hives  . Tea Hives  . Isosulfan Blue Rash  . Other Itching and Rash    CHG soap    Current Outpatient Medications  Medication Sig Dispense Refill  . acetaminophen (TYLENOL) 500 MG tablet Take 500-1,000 mg by mouth 2 (two) times daily as needed for moderate pain.    . carvedilol (COREG) 6.25 MG tablet Take 1 tablet (6.25 mg total) by mouth 2 (two) times daily with a meal. 180 tablet 3  . cetirizine (ZYRTEC) 10 MG tablet Take 10 mg by mouth daily.    . Evolocumab (REPATHA SURECLICK) 161 MG/ML SOAJ Inject 1 pen into the skin every 14 (fourteen) days.    . famotidine (PEPCID) 20 MG tablet Take 20 mg by mouth at bedtime.    . fluticasone (FLONASE) 50 MCG/ACT nasal spray Place 1 spray into both nostrils daily as needed for allergies or rhinitis.    Marland Kitchen glipiZIDE (GLUCOTROL XL) 10 MG 24 hr tablet Take 10 mg by mouth daily with breakfast.     . losartan (COZAAR) 25 MG tablet Take 1 tablet (25 mg total) by mouth daily. 90 tablet 3  . Menthol-Methyl Salicylate (MUSCLE RUB EX) Apply 1 application topically as needed (for pain).    . metFORMIN (GLUCOPHAGE-XR) 750 MG 24 hr tablet Take 850 mg by mouth 2 (two) times daily.     . prasugrel (EFFIENT) 10 MG TABS tablet Take 1 tablet (10 mg total) by mouth daily. 90 tablet 2  . sertraline (ZOLOFT) 50 MG tablet Take 25 mg by mouth daily.     . TRULICITY 0.96 EA/5.4UJ SOPN Inject 0.75 mg into the skin once a week.   3  . TURMERIC CURCUMIN PO Take 1,200 mg by mouth daily.      No current facility-administered medications for this visit.        Physical Exam: BP 118/61 (BP Location: Left Arm, Patient Position: Sitting, Cuff Size: Large)   Pulse 79   Temp (!)  96.8 F (36 C)   Resp 16   Ht 5\' 2"  (1.575 m)   Wt 200 lb (90.7 kg)   SpO2 96% Comment: RA  BMI 36.58 kg/m  General appearance: alert, cooperative and appears stated age Neck: no adenopathy, no carotid bruit, no JVD, supple, symmetrical, trachea midline and thyroid not enlarged, symmetric, no tenderness/mass/nodules Lymph nodes: Cervical, supraclavicular, and axillary nodes normal. Resp: clear to auscultation bilaterally Back: symmetric, no curvature. ROM normal. No CVA tenderness. Cardio: regular rate and rhythm, S1, S2 normal, no murmur, click, rub or gallop GI: soft, non-tender; bowel sounds normal; no masses,  no organomegaly Extremities: extremities normal, atraumatic,  no cyanosis or edema Neurologic: Grossly normal No murmur of aortic stenosis, sternum is stable and well-healed  Diagnostic Studies & Laboratory data:         Recent Radiology Findings: Mm 3d Screen Breast Bilateral  Result Date: 04/18/2019 CLINICAL DATA:  Screening. EXAM: DIGITAL SCREENING BILATERAL MAMMOGRAM WITH TOMO AND CAD COMPARISON:  Previous exam(s). ACR Breast Density Category c: The breast tissue is heterogeneously dense, which may obscure small masses. FINDINGS: There are no findings suspicious for malignancy. Images were processed with CAD. IMPRESSION: No mammographic evidence of malignancy. A result letter of this screening mammogram will be mailed directly to the patient. RECOMMENDATION: Screening mammogram in one year. (Code:SM-B-01Y) BI-RADS CATEGORY  1: Negative. Electronically Signed   By: Evangeline Dakin M.D.   On: 04/18/2019 08:34    CLINICAL DATA:  Non-small-cell lung cancer of the left upper lobe. Status post wedge resection.  EXAM: CT CHEST WITH CONTRAST  TECHNIQUE: Multidetector CT imaging of the chest was performed during intravenous contrast administration.  CONTRAST:  16mL OMNIPAQUE IOHEXOL 300 MG/ML  SOLN  COMPARISON:  PET-CT 06/29/2018.  FINDINGS: Cardiovascular: The  heart size is normal. No substantial pericardial effusion. Status post AVR. Coronary artery calcification is evident. Atherosclerotic calcification is noted in the wall of the thoracic aorta.  Mediastinum/Nodes: No mediastinal lymphadenopathy. There is no hilar lymphadenopathy. The esophagus has normal imaging features. There is no axillary lymphadenopathy.  Lungs/Pleura: The central tracheobronchial airways are patent. 4 mm right upper lobe subpleural nodule is unchanged in the interval. Stable 5 mm ground-glass nodule in the lateral right lung (67/7). Architectural distortion and scarring noted left upper lobe with associated staple line. Soft tissue attenuation associated with the staple line has decreased in the interval. No new suspicious pulmonary nodule or mass on today's study. No pleural effusion. Scarring noted medial right lower lobe.  Upper Abdomen: Small focus of subcapsular enhancement in the posterior right liver (142/2) is stable since 04/15/2018. This is probably a vascular malformation. Cortical scarring noted in the incompletely visualized kidneys.  Musculoskeletal: No worrisome lytic or sclerotic osseous abnormality.  IMPRESSION: 1. Interval decrease in the volume of soft tissue associated with the left upper lobe surgical site. 2. Stable tiny right lung nodules. Continued attention on follow-up recommended. 3. No change small focus of subcapsular hyper enhancement in the posterior right liver comparing back to 04/15/2018. Probably vascular malformation. This could also be reassessed at the time of follow-up imaging. 4.  Aortic Atherosclerois (ICD10-170.0)   Electronically Signed   By: Misty Stanley M.D.   On: 01/10/2019 08:53    I have independently reviewed the above radiology studies  and reviewed the findings with the patient.   CLINICAL DATA:  Initial treatment strategy for pulmonary nodule.  EXAM: NUCLEAR MEDICINE PET SKULL BASE TO  THIGH  TECHNIQUE: 9.6 mCi F-18 FDG was injected intravenously. Full-ring PET imaging was performed from the skull base to thigh after the radiotracer. CT data was obtained and used for attenuation correction and anatomic localization.  Fasting blood glucose: 128 mg/dl  COMPARISON:  Chest CT 04/15/2018  FINDINGS: Mediastinal blood pool activity: SUV max 2.9  NECK: No hypermetabolic lymph nodes in the neck.  Incidental CT findings: none  CHEST: Interval midline sternotomy and CABG. Moderate metabolic activity along the sternum related to postsurgical inflammation.  Interval LEFT upper lobe wedge resection. There is staple line with parenchymal thickening in the LEFT upper lobe. There is mild metabolic activity along this surgical margin with SUV max equal 3.9  which is felt benign postsurgical change. No discrete hypermetabolic nodule.  No hypermetabolic mediastinal lymph nodes. There is a focus of atelectasis along the left cardiac border with SUV max equal 4.0 (image 81) which is also favored benign.  Incidental CT findings: none  ABDOMEN/PELVIS: No abnormal hypermetabolic activity within the liver, pancreas, adrenal glands, or spleen. No hypermetabolic lymph nodes in the abdomen or pelvis.  Intense physiologic activity in the bowel.  Incidental CT findings: Uterus and ovaries normal  SKELETON: No focal hypermetabolic activity to suggest skeletal metastasis.  Incidental CT findings: none  IMPRESSION: 1. Postsurgical change in the LEFT upper lobe without clear evidence of carcinoma. Recommend follow-up CT surveillance. 2. No evidence of metastatic disease. 3. Interval sternotomy and CABG   Electronically Signed   By: Suzy Bouchard M.D.   On: 06/30/2018 08:20   Recent Labs: Lab Results  Component Value Date   WBC 7.1 01/09/2019   HGB 12.3 01/09/2019   HCT 38.4 01/09/2019   PLT 221 01/09/2019   GLUCOSE 191 (H) 01/09/2019   CHOL 109  11/21/2018   TRIG 119 11/21/2018   HDL 23 (L) 11/21/2018   LDLCALC 62 11/21/2018   ALT 14 01/09/2019   AST 16 01/09/2019   NA 140 01/09/2019   K 4.5 01/09/2019   CL 104 01/09/2019   CREATININE 1.04 (H) 01/09/2019   BUN 11 01/09/2019   CO2 26 01/09/2019   INR 1.53 05/17/2018   HGBA1C 9.3 (H) 05/12/2018      Assessment / Plan:  #1 status post coronary artery bypass grafting and aortic valve replacement- stable post op course  #2 non-small cell carcinoma of the lung well differentiated 1.2 cm left upper lobe with a follow-up CT scan done in May without evidence of recurrence  Continue follow-up for her lung resection, stage I non-small cell lung cancer left upper lobe.  I will plan to see her back in 6 months   Sarah Stevenson 04/20/2019 10:42 AM

## 2019-04-24 DIAGNOSIS — E119 Type 2 diabetes mellitus without complications: Secondary | ICD-10-CM | POA: Diagnosis not present

## 2019-04-24 DIAGNOSIS — Z135 Encounter for screening for eye and ear disorders: Secondary | ICD-10-CM | POA: Diagnosis not present

## 2019-04-24 DIAGNOSIS — H524 Presbyopia: Secondary | ICD-10-CM | POA: Diagnosis not present

## 2019-04-24 DIAGNOSIS — H2510 Age-related nuclear cataract, unspecified eye: Secondary | ICD-10-CM | POA: Diagnosis not present

## 2019-05-04 MED FILL — glipiZIDE ER 10 MG TB24: 10 | 90 days supply | Qty: 90 | Fill #2

## 2019-05-04 MED FILL — CELECOXIB 200 MG CAP: 200 | 90 days supply | Qty: 90 | Fill #1

## 2019-05-04 MED FILL — SERTRALINE HCL 50 MG TABS: 50 | 90 days supply | Qty: 90 | Fill #1

## 2019-05-04 MED FILL — REPATHA SURECLICK 140 MG/ML: 140 | 28 days supply | Qty: 2 | Fill #1

## 2019-05-05 ENCOUNTER — Telehealth: Payer: Self-pay | Admitting: Pharmacist

## 2019-05-05 DIAGNOSIS — E782 Mixed hyperlipidemia: Secondary | ICD-10-CM

## 2019-05-05 NOTE — Telephone Encounter (Addendum)
Called pt to follow up regarding lipids - she stopped Zetia and rosuvastatin and reports that her myalgias improved after about 2 weeks. She has successfully started on Repatha and has administered 2 injections so far. No issues with tolerability. Scheduled follow up labs in another month to assess efficacy.

## 2019-05-11 ENCOUNTER — Telehealth: Payer: Self-pay | Admitting: Internal Medicine

## 2019-05-11 NOTE — Telephone Encounter (Signed)
Scheduled appt per 9/03 sch message - pt is aware of appt date and time

## 2019-05-22 ENCOUNTER — Other Ambulatory Visit: Payer: Self-pay

## 2019-05-22 ENCOUNTER — Ambulatory Visit (HOSPITAL_COMMUNITY): Payer: 59 | Attending: Interventional Cardiology

## 2019-05-22 DIAGNOSIS — Z952 Presence of prosthetic heart valve: Secondary | ICD-10-CM | POA: Diagnosis not present

## 2019-05-29 ENCOUNTER — Telehealth: Payer: Self-pay

## 2019-05-29 DIAGNOSIS — I5189 Other ill-defined heart diseases: Secondary | ICD-10-CM

## 2019-05-29 NOTE — Telephone Encounter (Signed)
[id number]

## 2019-05-29 NOTE — Telephone Encounter (Signed)
-----   Message from Belva Crome, MD sent at 05/25/2019 12:17 PM EDT ----- Let the patient know the echo suggests diastolic heart failure/dysfunction.  Dyspnea may be improved with low-dose diuretic.  Recommend Spironolactone 12.5 mg/day.  Basic metabolic panel 7 days later. A copy will be sent to Christain Sacramento, MD

## 2019-06-05 ENCOUNTER — Other Ambulatory Visit: Payer: Self-pay

## 2019-06-05 ENCOUNTER — Other Ambulatory Visit: Payer: 59 | Admitting: *Deleted

## 2019-06-05 DIAGNOSIS — E782 Mixed hyperlipidemia: Secondary | ICD-10-CM

## 2019-06-06 LAB — HEPATIC FUNCTION PANEL
ALT: 12 IU/L (ref 0–32)
AST: 13 IU/L (ref 0–40)
Albumin: 4 g/dL (ref 3.8–4.8)
Alkaline Phosphatase: 102 IU/L (ref 39–117)
Bilirubin Total: 0.6 mg/dL (ref 0.0–1.2)
Bilirubin, Direct: 0.18 mg/dL (ref 0.00–0.40)
Total Protein: 6.5 g/dL (ref 6.0–8.5)

## 2019-06-06 LAB — LIPID PANEL
Chol/HDL Ratio: 3.5 ratio (ref 0.0–4.4)
Cholesterol, Total: 95 mg/dL — ABNORMAL LOW (ref 100–199)
HDL: 27 mg/dL — ABNORMAL LOW (ref >39)
LDL Chol Calc (NIH): 42 mg/dL (ref 0–99)
Triglycerides: 151 mg/dL — ABNORMAL HIGH (ref 0–149)
VLDL Cholesterol Cal: 26 mg/dL (ref 5–40)

## 2019-06-06 MED ORDER — SPIRONOLACTONE 25 MG PO TABS: 12.5000 mg | ORAL_TABLET | Freq: Every day | ORAL | 3 refills | Status: DC

## 2019-06-06 MED FILL — SPIRONOLACTONE 25 MG TABLET: 25 | 90 days supply | Qty: 45 | Fill #0

## 2019-06-06 NOTE — Telephone Encounter (Signed)
Spoke with pt and went over results and recommendations.  Pt verbalized understanding and was in agreement with plan. Pt will come for labs on 10/6.

## 2019-06-08 MED FILL — REPATHA SURECLICK 140 MG/ML: 140 | 28 days supply | Qty: 2 | Fill #2

## 2019-06-13 ENCOUNTER — Other Ambulatory Visit: Payer: 59

## 2019-06-13 ENCOUNTER — Other Ambulatory Visit: Payer: Self-pay

## 2019-06-13 DIAGNOSIS — I5189 Other ill-defined heart diseases: Secondary | ICD-10-CM | POA: Diagnosis not present

## 2019-06-13 LAB — BASIC METABOLIC PANEL
BUN/Creatinine Ratio: 11 — ABNORMAL LOW (ref 12–28)
BUN: 10 mg/dL (ref 8–27)
CO2: 23 mmol/L (ref 20–29)
Calcium: 9.6 mg/dL (ref 8.7–10.3)
Chloride: 102 mmol/L (ref 96–106)
Creatinine, Ser: 0.88 mg/dL (ref 0.57–1.00)
GFR calc Af Amer: 81 mL/min/{1.73_m2} (ref >59)
GFR calc non Af Amer: 70 mL/min/{1.73_m2} (ref >59)
Glucose: 148 mg/dL — ABNORMAL HIGH (ref 65–99)
Potassium: 5.1 mmol/L (ref 3.5–5.2)
Sodium: 136 mmol/L (ref 134–144)

## 2019-06-28 MED FILL — TRULICITY 0.75 MG/0.5 ML PE: 0.75 | 84 days supply | Qty: 6 | Fill #0

## 2019-07-10 ENCOUNTER — Other Ambulatory Visit: Payer: Self-pay | Admitting: Interventional Cardiology

## 2019-07-10 MED FILL — CARVEDILOL 6.25 MG TABLET: 6.25 | 90 days supply | Qty: 180 | Fill #0

## 2019-07-10 MED FILL — MetFORMIN HCL 850 MG TAB: 850 | 90 days supply | Qty: 180 | Fill #1

## 2019-07-10 MED FILL — REPATHA SURECLICK 140 MG/ML: 140 | 28 days supply | Qty: 2 | Fill #3

## 2019-07-10 MED FILL — PRASUGREL HCL 10 MG TABS: 10 | 90 days supply | Qty: 90 | Fill #1

## 2019-07-10 MED FILL — LOSARTAN POTASSIUM 25 MG TA: 25 | 90 days supply | Qty: 90 | Fill #2

## 2019-07-13 ENCOUNTER — Inpatient Hospital Stay: Payer: 59 | Attending: Internal Medicine

## 2019-07-13 ENCOUNTER — Ambulatory Visit (HOSPITAL_COMMUNITY)
Admission: RE | Admit: 2019-07-13 | Discharge: 2019-07-13 | Disposition: A | Discharge status: Home / Self Care | Payer: 59 | Source: Ambulatory Visit | Attending: Internal Medicine | Admitting: Internal Medicine

## 2019-07-13 ENCOUNTER — Other Ambulatory Visit: Payer: Self-pay

## 2019-07-13 ENCOUNTER — Encounter (HOSPITAL_COMMUNITY): Payer: Self-pay

## 2019-07-13 DIAGNOSIS — Z951 Presence of aortocoronary bypass graft: Secondary | ICD-10-CM | POA: Insufficient documentation

## 2019-07-13 DIAGNOSIS — I5032 Chronic diastolic (congestive) heart failure: Secondary | ICD-10-CM | POA: Insufficient documentation

## 2019-07-13 DIAGNOSIS — F329 Major depressive disorder, single episode, unspecified: Secondary | ICD-10-CM | POA: Insufficient documentation

## 2019-07-13 DIAGNOSIS — I2510 Atherosclerotic heart disease of native coronary artery without angina pectoris: Secondary | ICD-10-CM | POA: Insufficient documentation

## 2019-07-13 DIAGNOSIS — C3412 Malignant neoplasm of upper lobe, left bronchus or lung: Secondary | ICD-10-CM | POA: Insufficient documentation

## 2019-07-13 DIAGNOSIS — Z79899 Other long term (current) drug therapy: Secondary | ICD-10-CM | POA: Insufficient documentation

## 2019-07-13 DIAGNOSIS — M1990 Unspecified osteoarthritis, unspecified site: Secondary | ICD-10-CM | POA: Insufficient documentation

## 2019-07-13 DIAGNOSIS — E785 Hyperlipidemia, unspecified: Secondary | ICD-10-CM | POA: Insufficient documentation

## 2019-07-13 DIAGNOSIS — C3490 Malignant neoplasm of unspecified part of unspecified bronchus or lung: Secondary | ICD-10-CM | POA: Diagnosis not present

## 2019-07-13 DIAGNOSIS — E119 Type 2 diabetes mellitus without complications: Secondary | ICD-10-CM | POA: Insufficient documentation

## 2019-07-13 DIAGNOSIS — R0609 Other forms of dyspnea: Secondary | ICD-10-CM | POA: Insufficient documentation

## 2019-07-13 DIAGNOSIS — Z7984 Long term (current) use of oral hypoglycemic drugs: Secondary | ICD-10-CM | POA: Insufficient documentation

## 2019-07-13 DIAGNOSIS — I252 Old myocardial infarction: Secondary | ICD-10-CM | POA: Insufficient documentation

## 2019-07-13 DIAGNOSIS — Z791 Long term (current) use of non-steroidal anti-inflammatories (NSAID): Secondary | ICD-10-CM | POA: Insufficient documentation

## 2019-07-13 LAB — CMP (CANCER CENTER ONLY)
ALT: 13 U/L (ref 0–44)
AST: 12 U/L — ABNORMAL LOW (ref 15–41)
Albumin: 3.9 g/dL (ref 3.5–5.0)
Alkaline Phosphatase: 89 U/L (ref 38–126)
Anion gap: 11 (ref 5–15)
BUN: 14 mg/dL (ref 8–23)
CO2: 25 mmol/L (ref 22–32)
Calcium: 9.5 mg/dL (ref 8.9–10.3)
Chloride: 105 mmol/L (ref 98–111)
Creatinine: 1.04 mg/dL — ABNORMAL HIGH (ref 0.44–1.00)
GFR, Est AFR Am: >60 mL/min (ref >60)
GFR, Estimated: 57 mL/min — ABNORMAL LOW (ref >60)
Glucose, Bld: 118 mg/dL — ABNORMAL HIGH (ref 70–99)
Potassium: 4.8 mmol/L (ref 3.5–5.1)
Sodium: 141 mmol/L (ref 135–145)
Total Bilirubin: 0.5 mg/dL (ref 0.3–1.2)
Total Protein: 7.3 g/dL (ref 6.5–8.1)

## 2019-07-13 LAB — CBC WITH DIFFERENTIAL (CANCER CENTER ONLY)
Abs Immature Granulocytes: 0.02 10*3/uL (ref 0.00–0.07)
Basophils Absolute: 0.1 10*3/uL (ref 0.0–0.1)
Basophils Relative: 1 %
Eosinophils Absolute: 0.2 10*3/uL (ref 0.0–0.5)
Eosinophils Relative: 3 %
HCT: 43.2 % (ref 36.0–46.0)
Hemoglobin: 14 g/dL (ref 12.0–15.0)
Immature Granulocytes: 0 %
Lymphocytes Relative: 31 %
Lymphs Abs: 2.9 10*3/uL (ref 0.7–4.0)
MCH: 30.6 pg (ref 26.0–34.0)
MCHC: 32.4 g/dL (ref 30.0–36.0)
MCV: 94.3 fL (ref 80.0–100.0)
Monocytes Absolute: 0.8 10*3/uL (ref 0.1–1.0)
Monocytes Relative: 9 %
Neutro Abs: 5.1 10*3/uL (ref 1.7–7.7)
Neutrophils Relative %: 56 %
Platelet Count: 234 10*3/uL (ref 150–400)
RBC: 4.58 MIL/uL (ref 3.87–5.11)
RDW: 12.6 % (ref 11.5–15.5)
WBC Count: 9.1 10*3/uL (ref 4.0–10.5)
nRBC: 0 % (ref 0.0–0.2)

## 2019-07-13 MED ORDER — IOHEXOL 300 MG/ML  SOLN
75.0000 mL | Freq: Once | INTRAMUSCULAR | Status: AC | PRN
  Administered 2019-07-13: 75 mL via INTRAVENOUS

## 2019-07-13 MED ORDER — SODIUM CHLORIDE (PF) 0.9 % IJ SOLN
INTRAMUSCULAR | Status: AC
  Filled 2019-07-13: qty 50

## 2019-07-14 ENCOUNTER — Other Ambulatory Visit: Payer: 59

## 2019-07-17 ENCOUNTER — Inpatient Hospital Stay (HOSPITAL_BASED_OUTPATIENT_CLINIC_OR_DEPARTMENT_OTHER): Payer: 59 | Admitting: Internal Medicine

## 2019-07-17 ENCOUNTER — Encounter: Payer: Self-pay | Admitting: Internal Medicine

## 2019-07-17 ENCOUNTER — Other Ambulatory Visit: Payer: Self-pay

## 2019-07-17 VITALS — BP 132/72 | HR 77 | Temp 98.5°F | Resp 17 | Ht 62.0 in | Wt 209.1 lb

## 2019-07-17 DIAGNOSIS — C3412 Malignant neoplasm of upper lobe, left bronchus or lung: Secondary | ICD-10-CM | POA: Diagnosis not present

## 2019-07-17 DIAGNOSIS — E119 Type 2 diabetes mellitus without complications: Secondary | ICD-10-CM | POA: Diagnosis not present

## 2019-07-17 DIAGNOSIS — Z79899 Other long term (current) drug therapy: Secondary | ICD-10-CM | POA: Diagnosis not present

## 2019-07-17 DIAGNOSIS — I2510 Atherosclerotic heart disease of native coronary artery without angina pectoris: Secondary | ICD-10-CM | POA: Diagnosis not present

## 2019-07-17 DIAGNOSIS — R0609 Other forms of dyspnea: Secondary | ICD-10-CM | POA: Diagnosis not present

## 2019-07-17 DIAGNOSIS — I5032 Chronic diastolic (congestive) heart failure: Secondary | ICD-10-CM | POA: Diagnosis not present

## 2019-07-17 DIAGNOSIS — E785 Hyperlipidemia, unspecified: Secondary | ICD-10-CM | POA: Diagnosis not present

## 2019-07-17 DIAGNOSIS — I252 Old myocardial infarction: Secondary | ICD-10-CM | POA: Diagnosis not present

## 2019-07-17 DIAGNOSIS — C3492 Malignant neoplasm of unspecified part of left bronchus or lung: Secondary | ICD-10-CM

## 2019-07-17 DIAGNOSIS — F329 Major depressive disorder, single episode, unspecified: Secondary | ICD-10-CM | POA: Diagnosis not present

## 2019-07-17 DIAGNOSIS — C3490 Malignant neoplasm of unspecified part of unspecified bronchus or lung: Secondary | ICD-10-CM | POA: Diagnosis not present

## 2019-07-17 DIAGNOSIS — M1990 Unspecified osteoarthritis, unspecified site: Secondary | ICD-10-CM | POA: Diagnosis not present

## 2019-07-17 DIAGNOSIS — Z791 Long term (current) use of non-steroidal anti-inflammatories (NSAID): Secondary | ICD-10-CM | POA: Diagnosis not present

## 2019-07-17 DIAGNOSIS — Z951 Presence of aortocoronary bypass graft: Secondary | ICD-10-CM | POA: Diagnosis not present

## 2019-07-17 DIAGNOSIS — Z7984 Long term (current) use of oral hypoglycemic drugs: Secondary | ICD-10-CM | POA: Diagnosis not present

## 2019-07-17 NOTE — Progress Notes (Signed)
[facility] Telephone:([phone]   Fax:(336) [phone]  OFFICE PROGRESS NOTE  Christain Sacramento, MD [address] [place] [address]  DIAGNOSIS: Stage IA (T1 a, Nx, Mx) non-small cell lung cancer, adenocarcinoma measuring 1.2 cm   PRIOR THERAPY: Status post wedge resection of the left upper lobe on May 24, 2018 under the care of Dr. Servando Snare.  CURRENT THERAPY: Observation.  INTERVAL HISTORY: Sarah Stevenson 63 y.o. female returns to the clinic today for 6 months follow-up visit.  The patient is feeling fine today with no concerning complaints except for the shortness of breath with exertion with no cough or hemoptysis.  She denied having any chest pain.  She was recently diagnosed with diastolic congestive heart failure by her cardiologist.  She is currently on spironolactone.  She denied having any nausea, vomiting, diarrhea or constipation.  She denied having any fever or chills.  She is here today for evaluation with repeat CT scan of the chest for restaging of her disease.   MEDICAL HISTORY: Past Medical History:  Diagnosis Date  . Anginal pain ([facility])   . Aortic stenosis   . Coronary artery disease   . Depression    "mild" (08/06/2016)  . Dyspnea   . Hyperlipidemia   . lung ca dx'd 05/2018  . Murmur, cardiac   . Myocardial infarction ([facility]) 2016  . Osteoarthritis    "left hip and knee" (08/06/2016)  . Type II diabetes mellitus ([facility])     ALLERGIES:  is allergic to brilinta [ticagrelor]; lisinopril; asa [aspirin]; blue dyes (parenteral); corn-containing products; nsaids; sugar-protein-starch; tea; rosuvastatin; zetia [ezetimibe]; isosulfan blue; and other.  MEDICATIONS:  Current Outpatient Medications  Medication Sig Dispense Refill  . acetaminophen (TYLENOL) 500 MG tablet Take 500-1,000 mg by mouth 2 (two) times daily as needed for moderate pain.    . carvedilol (COREG) 6.25 MG tablet TAKE 1 TABLET (6.25 MG TOTAL) BY MOUTH 2 (TWO) TIMES  DAILY WITH A MEAL. 180 tablet 0  . celecoxib (CELEBREX) 200 MG capsule     . cetirizine (ZYRTEC) 10 MG tablet Take 10 mg by mouth daily.    . Evolocumab (REPATHA SURECLICK) 811 MG/ML SOAJ Inject 1 pen into the skin every 14 (fourteen) days.    . famotidine (PEPCID) 20 MG tablet Take 20 mg by mouth at bedtime.    . fluticasone (FLONASE) 50 MCG/ACT nasal spray Place 1 spray into both nostrils daily as needed for allergies or rhinitis.    Marland Kitchen glipiZIDE (GLUCOTROL XL) 10 MG 24 hr tablet Take 10 mg by mouth daily with breakfast.     . Menthol-Methyl Salicylate (MUSCLE RUB EX) Apply 1 application topically as needed (for pain).    . metFORMIN (GLUCOPHAGE) 850 MG tablet     . prasugrel (EFFIENT) 10 MG TABS tablet Take 1 tablet (10 mg total) by mouth daily. 90 tablet 2  . sertraline (ZOLOFT) 50 MG tablet Take 25 mg by mouth daily.     Marland Kitchen spironolactone (ALDACTONE) 25 MG tablet Take 0.5 tablets (12.5 mg total) by mouth daily. 45 tablet 3  . TRULICITY 9.14 NW/2.9FA SOPN Inject 0.75 mg into the skin once a week.   3  . TURMERIC CURCUMIN PO Take 1,200 mg by mouth daily.     Marland Kitchen losartan (COZAAR) 25 MG tablet Take 1 tablet (25 mg total) by mouth daily. 90 tablet 3   No current facility-administered medications for this visit.     SURGICAL HISTORY:  Past Surgical History:  Procedure Laterality Date  . AORTIC VALVE REPLACEMENT N/A 05/17/2018   Procedure: AORTIC VALVE REPLACEMENT (AVR) USING 21 MM MAGNA EASE PERICARDIAL BIOPROSTHESIS. MODEL # [id number]. SERIAL # [id number];  Surgeon: Grace Isaac, MD;  Location: [facility];  Service: Open Heart Surgery;  Laterality: N/A;  . CARDIAC CATHETERIZATION Right 11/30/2014   Procedure: RIGHT/LEFT HEART CATH AND CORONARY ANGIOGRAPHY;  Surgeon: Belva Crome, MD;  Location: [facility] CATH LAB;  Service: Cardiovascular;  Laterality: Right;  . CARDIAC CATHETERIZATION N/A 12/13/2015   Procedure: Left Heart Cath and Coronary Angiography;  Surgeon: Leonie Man, MD;  Location: [facility] CV LAB;  Service: Cardiovascular;  Laterality: N/A;  . CARDIAC CATHETERIZATION N/A 12/13/2015   Procedure: Intravascular Pressure Wire/FFR Study;  Surgeon: Leonie Man, MD;  Location: [facility] CV LAB;  Service: Cardiovascular;  Laterality: N/A;  RCA  . CARDIAC CATHETERIZATION N/A 08/06/2016   Procedure: Right/Left Heart Cath and Coronary Angiography;  Surgeon: Lorretta Harp, MD;  Location: [facility] CV LAB;  Service: Cardiovascular;  Laterality: N/A;  . CARDIAC CATHETERIZATION N/A 08/06/2016   Procedure: Coronary Stent Intervention;  Surgeon: Lorretta Harp, MD;  Location: [facility] CV LAB;  Service: Cardiovascular;  Laterality: N/A;   distal rca 3.0x16 and 3.0x8 synergy  . CESAREAN SECTION  1989  . CORONARY ANGIOPLASTY WITH STENT PLACEMENT  08/06/2016   "2 stents"  . CORONARY ARTERY BYPASS GRAFT N/A 05/17/2018   Procedure: CORONARY ARTERY BYPASS GRAFTING (CABG) X 2 WITH ENDOSCOPIC HARVESTING OF RIGHT SAPHENOUS VEIN. LIMA TO LAD. SVG TO RCA;  Surgeon: Grace Isaac, MD;  Location: [facility];  Service: Open Heart Surgery;  Laterality: N/A;  . HYSTEROSCOPY DIAGNOSTIC  1990s  . JOINT REPLACEMENT    . KNEE ARTHROSCOPY Left 1996  . NASAL SINUS SURGERY  1977  . RIGHT/LEFT HEART CATH AND CORONARY ANGIOGRAPHY N/A 04/01/2018   Procedure: RIGHT/LEFT HEART CATH AND CORONARY ANGIOGRAPHY;  Surgeon: Belva Crome, MD;  Location: [facility] CV LAB;  Service: Cardiovascular;  Laterality: N/A;  . TEE WITHOUT CARDIOVERSION N/A 05/17/2018   Procedure: TRANSESOPHAGEAL ECHOCARDIOGRAM (TEE);  Surgeon: Grace Isaac, MD;  Location: [facility];  Service: Open Heart Surgery;  Laterality: N/A;  . TOTAL HIP ARTHROPLASTY Left 04/23/2017   Procedure: LEFT TOTAL HIP ARTHROPLASTY ANTERIOR APPROACH;  Surgeon: Mcarthur Rossetti, MD;  Location: [facility];  Service: Orthopedics;  Laterality: Left;  . WEDGE RESECTION Left 05/17/2018   Procedure: LEFT UPPER LOBE LUNG WEDGE RESECTION;  Surgeon: Grace Isaac, MD;  Location: [facility];  Service: Open Heart Surgery;  Laterality: Left;    REVIEW OF SYSTEMS:  A comprehensive review of systems was negative except for: Respiratory: positive for dyspnea on exertion   PHYSICAL EXAMINATION: General appearance: alert, cooperative and no distress Head: Normocephalic, without obvious abnormality, atraumatic Neck: no adenopathy, no JVD, supple, symmetrical, trachea midline and thyroid not enlarged, symmetric, no tenderness/mass/nodules Lymph nodes: Cervical, supraclavicular, and axillary nodes normal. Resp: clear to auscultation bilaterally Back: symmetric, no curvature. ROM normal. No CVA tenderness. Cardio: regular rate and rhythm, S1, S2 normal, no murmur, click, rub or gallop GI: soft, non-tender; bowel sounds normal; no masses,  no organomegaly Extremities: extremities normal, atraumatic, no cyanosis or edema  ECOG PERFORMANCE STATUS: 1 - Symptomatic but completely ambulatory  Blood pressure 132/72, pulse 77, temperature 98.5 F (36.9 C), temperature source Temporal, resp. rate 17, height 5\' 2"  (1.575 m), weight 209 lb 1.6 oz (94.8 kg), SpO2 99 %.  LABORATORY DATA: Lab Results  Component Value Date   WBC 9.1 07/13/2019   HGB 14.0 07/13/2019   HCT 43.2 07/13/2019   MCV 94.3 07/13/2019   PLT 234 07/13/2019      Chemistry      Component Value Date/Time   NA 141 07/13/2019 1004   NA 136 06/13/2019 1151   K 4.8 07/13/2019 1004   CL 105 07/13/2019 1004   CO2 25 07/13/2019 1004   BUN 14 07/13/2019 1004   BUN 10 06/13/2019 1151   CREATININE 1.04 (H) 07/13/2019 1004      Component Value Date/Time   CALCIUM 9.5 07/13/2019 1004   ALKPHOS 89 07/13/2019 1004   AST 12 (L) 07/13/2019 1004   ALT 13 07/13/2019 1004   BILITOT 0.5 07/13/2019 1004       RADIOGRAPHIC STUDIES: Ct Chest W Contrast  Result Date: 07/13/2019 CLINICAL DATA:  Lung cancer staging, status post wedge resection left upper lobe EXAM: CT CHEST WITH CONTRAST TECHNIQUE:  Multidetector CT imaging of the chest was performed during intravenous contrast administration. CONTRAST:  60mL OMNIPAQUE IOHEXOL 300 MG/ML  SOLN COMPARISON:  01/09/2019 FINDINGS: Cardiovascular: Aortic atherosclerosis. Normal heart size. Status post aortic valve replacement. Extensive 3 vessel coronary artery calcifications and/or stents. No pericardial effusion. Mediastinum/Nodes: No enlarged mediastinal, hilar, or axillary lymph nodes. Thyroid gland, trachea, and esophagus demonstrate no significant findings. Lungs/Pleura: Stable postoperative findings of left upper lobe wedge resection. Unchanged, scattered small mostly ground-glass nodules, primarily of the dependent right lung (series 7, image 33, 56). Dependent scarring of the right lower lobe. No pleural effusion or pneumothorax. Upper Abdomen: No acute abnormality.  Hepatic steatosis. Musculoskeletal: No chest wall mass or suspicious bone lesions identified. IMPRESSION: 1. Stable postoperative findings of left upper lobe wedge resection. No evidence of malignant recurrence or metastatic disease in the chest. 2. Unchanged, scattered small mostly ground-glass nodules, primarily of the dependent right lung (series 7, image 33, 56), appearance and distribution likely infectious or inflammatory. Attention on follow-up. 3.  Coronary artery disease.  Aortic Atherosclerosis (ICD10-I70.0). 4.  Hepatic steatosis. Electronically Signed   By: Eddie Candle M.D.   On: 07/13/2019 14:09    ASSESSMENT AND PLAN: This is a very pleasant 63 years old white female diagnosed with a stage Ia non-small cell lung cancer status post wedge resection of the left upper lobe in September 2019 under the care of Dr. Servando Snare. The patient is currently on observation and she is feeling fine today with no concerning complaints. She had repeat CT scan of the chest performed recently.  I personally and independently reviewed the scans and discussed the results with the patient today. Her  scan showed no concerning findings for disease recurrence or progression. I recommended for her to continue on observation with repeat CT scan of the chest in 6 months. She was advised to call immediately if she has any concerning symptoms in the interval. The patient voices understanding of current disease status and treatment options and is in agreement with the current care plan. All questions were answered. The patient knows to call the clinic with any problems, questions or concerns. We can certainly see the patient much sooner if necessary.  I spent 10 minutes counseling the patient face to face. The total time spent in the appointment was 15 minutes.  Disclaimer: This note was dictated with voice recognition software. Similar sounding words can inadvertently be transcribed and may not be corrected upon review.

## 2019-07-18 ENCOUNTER — Telehealth: Payer: Self-pay | Admitting: Internal Medicine

## 2019-07-18 NOTE — Telephone Encounter (Signed)
Scheduled per los. Called and left msg. Mailed printout  °

## 2019-07-21 ENCOUNTER — Telehealth: Payer: Self-pay | Admitting: Interventional Cardiology

## 2019-07-21 MED FILL — glipiZIDE ER 10 MG TB24: 10 | 90 days supply | Qty: 90 | Fill #3

## 2019-07-21 NOTE — Telephone Encounter (Signed)
Okay to resume 12.5 mg daily

## 2019-07-21 NOTE — Telephone Encounter (Signed)
Left message to call back  

## 2019-07-21 NOTE — Telephone Encounter (Signed)
Pt calling stating that she has took her medication spironolactone wrong, pt states that she took 1 tablet daily of the spironolactone and pt was only supposed to take 1/2 tablet daily and not pt has run out of medication and the pharmacy will not refill because the request is too early. Pt would like a call back concerning this matter to see how to be able to get more medicine. Please address

## 2019-07-24 MED ORDER — SPIRONOLACTONE 25 MG PO TABS: 25.0000 mg | ORAL_TABLET | Freq: Every day | ORAL | 3 refills | Status: DC

## 2019-07-24 NOTE — Telephone Encounter (Signed)
Pt would like a call back concerning this matter, pt is still unable to get medication from pharmacy. Please address

## 2019-07-24 NOTE — Telephone Encounter (Signed)
Spoke with Dr. Tamala Julian and he said ok to continue taking full tablet if BP and labs have been ok.  Pt had CMP at [facility] and labs looked good.  Pt states BP and HR has been fine, 130s/70s.  Advised pt I will send in new prescription for Spironolactone 25mg  QD.  Pt appreciative for call.

## 2019-07-25 MED FILL — SPIRONOLACTONE 25 MG TABLET: 25 | 45 days supply | Qty: 45 | Fill #0

## 2019-07-31 NOTE — Progress Notes (Signed)
Cardiology Office Note:    Date:  08/02/2019   ID:  Sarah Stevenson, DOB [birth year], MRN [id number]  PCP:  Christain Sacramento, MD  Cardiologist:  Sinclair Grooms, MD   Referring MD: Christain Sacramento, MD   Chief Complaint  Patient presents with  . Congestive Heart Failure  . Coronary Artery Disease  . Cardiac Valve Problem    History of Present Illness:    Sarah Stevenson is a 63 y.o. female with a hx of CAD with prior RCA DES and severe diagonal #2 disease, andaortic stenosis treated with CABG and AVR. Found to have lung cancer at time of heart surgery.  She mistakenly has been taking Aldactone 25 mg daily instead of 12.5 mg.  Laboratory data is acceptable on the current higher dose and her breathing is better.  We have decided to leave Aldactone dose as it is.  We discussed diastolic heart failure.  Is no peripheral edema, angina, orthopnea, PND, or palpitations.  Past Medical History:  Diagnosis Date  . Anginal pain ([place])   . Aortic stenosis   . Coronary artery disease   . Depression    "mild" (08/06/2016)  . Dyspnea   . Hyperlipidemia   . lung ca dx'd 05/2018  . Murmur, cardiac   . Myocardial infarction ([place]) 2016  . Osteoarthritis    "left hip and knee" (08/06/2016)  . Type II diabetes mellitus ([place])     Past Surgical History:  Procedure Laterality Date  . AORTIC VALVE REPLACEMENT N/A 05/17/2018   Procedure: AORTIC VALVE REPLACEMENT (AVR) USING 21 MM MAGNA EASE PERICARDIAL BIOPROSTHESIS. MODEL # [id number]. SERIAL # [id number];  Surgeon: Grace Isaac, MD;  Location: [place];  Service: Open Heart Surgery;  Laterality: N/A;  . CARDIAC CATHETERIZATION Right 11/30/2014   Procedure: RIGHT/LEFT HEART CATH AND CORONARY ANGIOGRAPHY;  Surgeon: Belva Crome, MD;  Location: [facility] CATH LAB;  Service: Cardiovascular;  Laterality: Right;  . CARDIAC CATHETERIZATION N/A 12/13/2015   Procedure: Left Heart Cath and Coronary Angiography;  Surgeon: Leonie Man, MD;  Location: [place] CV LAB;  Service: Cardiovascular;  Laterality: N/A;  . CARDIAC CATHETERIZATION N/A 12/13/2015   Procedure: Intravascular Pressure Wire/FFR Study;  Surgeon: Leonie Man, MD;  Location: [place] CV LAB;  Service: Cardiovascular;  Laterality: N/A;  RCA  . CARDIAC CATHETERIZATION N/A 08/06/2016   Procedure: Right/Left Heart Cath and Coronary Angiography;  Surgeon: Lorretta Harp, MD;  Location: [place] CV LAB;  Service: Cardiovascular;  Laterality: N/A;  . CARDIAC CATHETERIZATION N/A 08/06/2016   Procedure: Coronary Stent Intervention;  Surgeon: Lorretta Harp, MD;  Location: [place] CV LAB;  Service: Cardiovascular;  Laterality: N/A;   distal rca 3.0x16 and 3.0x8 synergy  . CESAREAN SECTION  1989  . CORONARY ANGIOPLASTY WITH STENT PLACEMENT  08/06/2016   "2 stents"  . CORONARY ARTERY BYPASS GRAFT N/A 05/17/2018   Procedure: CORONARY ARTERY BYPASS GRAFTING (CABG) X 2 WITH ENDOSCOPIC HARVESTING OF RIGHT SAPHENOUS VEIN. LIMA TO LAD. SVG TO RCA;  Surgeon: Grace Isaac, MD;  Location: [place];  Service: Open Heart Surgery;  Laterality: N/A;  . HYSTEROSCOPY DIAGNOSTIC  1990s  . JOINT REPLACEMENT    . KNEE ARTHROSCOPY Left 1996  . NASAL SINUS SURGERY  1977  . RIGHT/LEFT HEART CATH AND CORONARY ANGIOGRAPHY N/A 04/01/2018   Procedure: RIGHT/LEFT HEART CATH AND CORONARY ANGIOGRAPHY;  Surgeon: Belva Crome, MD;  Location: [place] CV LAB;  Service: Cardiovascular;  Laterality: N/A;  . TEE WITHOUT CARDIOVERSION N/A 05/17/2018   Procedure: TRANSESOPHAGEAL ECHOCARDIOGRAM (TEE);  Surgeon: Grace Isaac, MD;  Location: [place];  Service: Open Heart Surgery;  Laterality: N/A;  . TOTAL HIP ARTHROPLASTY Left 04/23/2017   Procedure: LEFT TOTAL HIP ARTHROPLASTY ANTERIOR APPROACH;  Surgeon: Mcarthur Rossetti, MD;  Location: [facility];  Service: Orthopedics;  Laterality: Left;  . WEDGE RESECTION Left 05/17/2018   Procedure: LEFT UPPER LOBE LUNG WEDGE RESECTION;  Surgeon: Grace Isaac, MD;  Location: [place];  Service: Open Heart Surgery;  Laterality: Left;    Current Medications: Current Meds  Medication Sig  . acetaminophen (TYLENOL) 500 MG tablet Take 500-1,000 mg by mouth 2 (two) times daily as needed for moderate pain.  . carvedilol (COREG) 6.25 MG tablet TAKE 1 TABLET (6.25 MG TOTAL) BY MOUTH 2 (TWO) TIMES DAILY WITH A MEAL.  . celecoxib (CELEBREX) 200 MG capsule Take 200 mg by mouth as needed.   . cetirizine (ZYRTEC) 10 MG tablet Take 10 mg by mouth daily.  . Evolocumab (REPATHA SURECLICK) 782 MG/ML SOAJ Inject 1 pen into the skin every 14 (fourteen) days.  . famotidine (PEPCID) 20 MG tablet Take 20 mg by mouth at bedtime.  . fluticasone (FLONASE) 50 MCG/ACT nasal spray Place 1 spray into both nostrils daily as needed for allergies or rhinitis.  Marland Kitchen glipiZIDE (GLUCOTROL XL) 10 MG 24 hr tablet Take 10 mg by mouth daily with breakfast.   . Menthol-Methyl Salicylate (MUSCLE RUB EX) Apply 1 application topically as needed (for pain).  . metFORMIN (GLUCOPHAGE) 850 MG tablet Take 850 mg by mouth 2 (two) times daily with a meal.   . prasugrel (EFFIENT) 10 MG TABS tablet Take 1 tablet (10 mg total) by mouth daily.  . sertraline (ZOLOFT) 50 MG tablet Take 25 mg by mouth daily.   Marland Kitchen spironolactone (ALDACTONE) 25 MG tablet Take 1 tablet (25 mg total) by mouth daily.  . TRULICITY 9.56 OZ/3.0QM SOPN Inject 0.75 mg into the skin once a week.   . TURMERIC CURCUMIN PO Take 1,200 mg by mouth daily.      Allergies:   Brilinta [ticagrelor], Lisinopril, Asa [aspirin], Blue dyes (parenteral), Corn-containing products, Nsaids, Sugar-protein-starch, Tea, Rosuvastatin, Zetia [ezetimibe], Isosulfan blue, and Other   Social History   Socioeconomic History  . Marital status: Divorced    Spouse name: Not on file  . Number of children: Not on file  . Years of education: Not on file  . Highest education level: Bachelor's degree (e.g., BA, AB, BS)  Occupational History  . Not on  file  Social Needs  . Financial resource strain: Not hard at all  . Food insecurity    Worry: Never true    Inability: Never true  . Transportation needs    Medical: No    Non-medical: No  Tobacco Use  . Smoking status: Former Smoker    Packs/day: 1.00    Years: 10.00    Pack years: 10.00    Types: Cigarettes    Quit date: 1982    Years since quitting: 38.9  . Smokeless tobacco: Never Used  . Tobacco comment: restarted  about 5-6 yrs ago, then stopped  Substance and Sexual Activity  . Alcohol use: Yes    Alcohol/week: 7.0 standard drinks    Types: 7 Shots of liquor per week    Comment: weekly  . Drug use: No  . Sexual activity: Never  Lifestyle  . Physical activity  Days per week: 0 days    Minutes per session: 0 min  . Stress: Not at all  Relationships  . Social Herbalist on phone: Not on file    Gets together: Not on file    Attends religious service: Not on file    Active member of club or organization: Not on file    Attends meetings of clubs or organizations: Not on file    Relationship status: Not on file  Other Topics Concern  . Not on file  Social History Narrative  . Not on file     Family History: The patient's family history includes Heart attack in her father and mother; Sudden death in her father. There is no history of Hypertension, Hyperlipidemia, or Diabetes.  ROS:   Please see the history of present illness.    Depressed.  Feels that she may stop working but worried about income as she is to young to retire.  Health insurance is her major concern.  All other systems reviewed and are negative.  EKGs/Labs/Other Studies Reviewed:    The following studies were reviewed today: The creatinine is 1.04 and potassium 4.8 on 07/13/2019 several weeks after starting Aldactone.  EKG:  EKG relatively low voltage, poor R wave progression V1 through V4, left axis deviation and probable left anterior hemiblock.  No change compared to prior  tracings.  Recent Labs: 07/13/2019: ALT 13; BUN 14; Creatinine 1.04; Hemoglobin 14.0; Platelet Count 234; Potassium 4.8; Sodium 141  Recent Lipid Panel    Component Value Date/Time   CHOL 95 (L) 06/05/2019 1212   TRIG 151 (H) 06/05/2019 1212   HDL 27 (L) 06/05/2019 1212   CHOLHDL 3.5 06/05/2019 1212   CHOLHDL 4.8 12/01/2014 0425   VLDL 24 12/01/2014 0425   LDLCALC 42 06/05/2019 1212    Physical Exam:    VS:  BP 118/74   Pulse 77   Ht 5\' 2"  (1.575 m)   Wt 207 lb 3.2 oz (94 kg)   SpO2 96%   BMI 37.90 kg/m     Wt Readings from Last 3 Encounters:  08/02/19 207 lb 3.2 oz (94 kg)  07/17/19 209 lb 1.6 oz (94.8 kg)  04/20/19 200 lb (90.7 kg)     GEN: Moderate obesity. No acute distress HEENT: Normal NECK: No JVD. LYMPHATICS: No lymphadenopathy CARDIAC: 2/6 crescendo decrescendo systolic murmur.  RRR without diastolic murmur, gallop, or edema. VASCULAR:  Normal Pulses. No bruits. RESPIRATORY:  Clear to auscultation without rales, wheezing or rhonchi  ABDOMEN: Soft, non-tender, non-distended, No pulsatile mass, MUSCULOSKELETAL: No deformity  SKIN: Warm and dry NEUROLOGIC:  Alert and oriented x 3 PSYCHIATRIC:  Normal affect   ASSESSMENT:    1. Chronic diastolic heart failure ([place])   2. H/O aortic valve replacement   3. Coronary artery disease involving coronary bypass graft of native heart with angina pectoris ([place])   4. Mixed hyperlipidemia   5. Educated about COVID-19 virus infection    PLAN:    In order of problems listed above:  1. This is been the more recent concern.  Exertional dyspnea has been a limiting factor.  This is now better after starting Aldactone 25 mg/day.  She will need to have a basic metabolic panel done again in 3 to 4 months.  Otherwise I will plan on seeing her back in 6 to 9 months. 2. The systolic murmur of her aortic valve prosthesis is heard.  No regurgitation is noted. 3. Secondary prevention discussed.  Aerobic activity is encouraged. 4.  LDL target less than 70. 5. The 3W's is discussed, understood, and being practiced to avoid infection.  Overall education and awareness concerning primary/secondary risk prevention was discussed in detail: LDL less than 70, hemoglobin A1c less than 7, blood pressure target less than 130/80 mmHg, >150 minutes of moderate aerobic activity per week, avoidance of smoking, weight control (via diet and exercise), and continued surveillance/management of/for obstructive sleep apnea.    Medication Adjustments/Labs and Tests Ordered: Current medicines are reviewed at length with the patient today.  Concerns regarding medicines are outlined above.  Orders Placed This Encounter  Procedures  . EKG 12-Lead   No orders of the defined types were placed in this encounter.   Patient Instructions  Medication Instructions:  Your physician recommends that you continue on your current medications as directed. Please refer to the Current Medication list given to you today.  *If you need a refill on your cardiac medications before your next appointment, please call your pharmacy*  Lab Work: None If you have labs (blood work) drawn today and your tests are completely normal, you will receive your results only by: Marland Kitchen MyChart Message (if you have MyChart) OR . A paper copy in the mail If you have any lab test that is abnormal or we need to change your treatment, we will call you to review the results.  Testing/Procedures: None  Follow-Up: At [facility], you and your health needs are our priority.  As part of our continuing mission to provide you with exceptional heart care, we have created designated Provider Care Teams.  These Care Teams include your primary Cardiologist (physician) and Advanced Practice Providers (APPs -  Physician Assistants and Nurse Practitioners) who all work together to provide you with the care you need, when you need it.  Your next appointment:   12 month(s)  The format for  your next appointment:   In Person  Provider:   You may see Sinclair Grooms, MD or one of the following Advanced Practice Providers on your designated Care Team:    Truitt Merle, NP  Cecilie Kicks, NP  Kathyrn Drown, NP   Other Instructions      Signed, Sinclair Grooms, MD  08/02/2019 4:40 PM    [place]

## 2019-08-02 ENCOUNTER — Ambulatory Visit (INDEPENDENT_AMBULATORY_CARE_PROVIDER_SITE_OTHER): Payer: 59 | Admitting: Interventional Cardiology

## 2019-08-02 ENCOUNTER — Other Ambulatory Visit: Payer: Self-pay

## 2019-08-02 ENCOUNTER — Encounter: Payer: Self-pay | Admitting: Interventional Cardiology

## 2019-08-02 VITALS — BP 118/74 | HR 77 | Ht 62.0 in | Wt 207.2 lb

## 2019-08-02 DIAGNOSIS — I5032 Chronic diastolic (congestive) heart failure: Secondary | ICD-10-CM | POA: Diagnosis not present

## 2019-08-02 DIAGNOSIS — E782 Mixed hyperlipidemia: Secondary | ICD-10-CM

## 2019-08-02 DIAGNOSIS — Z952 Presence of prosthetic heart valve: Secondary | ICD-10-CM

## 2019-08-02 DIAGNOSIS — I25709 Atherosclerosis of coronary artery bypass graft(s), unspecified, with unspecified angina pectoris: Secondary | ICD-10-CM | POA: Diagnosis not present

## 2019-08-02 DIAGNOSIS — Z7189 Other specified counseling: Secondary | ICD-10-CM

## 2019-08-02 NOTE — Patient Instructions (Signed)

## 2019-08-10 ENCOUNTER — Telehealth: Payer: Self-pay | Admitting: Interventional Cardiology

## 2019-08-10 DIAGNOSIS — M2550 Pain in unspecified joint: Secondary | ICD-10-CM | POA: Diagnosis not present

## 2019-08-10 DIAGNOSIS — E119 Type 2 diabetes mellitus without complications: Secondary | ICD-10-CM | POA: Diagnosis not present

## 2019-08-10 DIAGNOSIS — E782 Mixed hyperlipidemia: Secondary | ICD-10-CM | POA: Diagnosis not present

## 2019-08-10 DIAGNOSIS — E669 Obesity, unspecified: Secondary | ICD-10-CM | POA: Diagnosis not present

## 2019-08-10 DIAGNOSIS — I10 Essential (primary) hypertension: Secondary | ICD-10-CM | POA: Diagnosis not present

## 2019-08-10 NOTE — Telephone Encounter (Signed)
Pt had appt with Dr. Tamala Julian 11/25.  After leaving the office, Dr. Tamala Julian said we needed to get a BMET on the pt in Feb.    Called pt and left message to call back and schedule labs for sometime in Feb.

## 2019-08-11 MED FILL — REPATHA SURECLICK 140 MG/ML: 140 | 28 days supply | Qty: 2 | Fill #4

## 2019-08-29 NOTE — Telephone Encounter (Signed)
Pt called back and scheduled labs for 10/17/2019

## 2019-09-05 MED FILL — CELECOXIB 200 MG CAP: 200 | 90 days supply | Qty: 90 | Fill #2

## 2019-09-05 MED FILL — REPATHA SURECLICK 140 MG/ML: 140 | 28 days supply | Qty: 2 | Fill #5

## 2019-09-27 MED FILL — TRULICITY 0.75 MG/0.5 ML PE: 0.75 | 84 days supply | Qty: 6 | Fill #1

## 2019-09-27 MED FILL — REPATHA SURECLICK 140 MG/ML: 140 | 28 days supply | Qty: 2 | Fill #6

## 2019-10-06 ENCOUNTER — Other Ambulatory Visit: Payer: Self-pay | Admitting: Interventional Cardiology

## 2019-10-06 MED FILL — CARVEDILOL 6.25 MG TABLET: 6.25 | 90 days supply | Qty: 180 | Fill #0

## 2019-10-06 MED FILL — METFORMIN HCL 850 MG TABS: 850 | 90 days supply | Qty: 180 | Fill #0

## 2019-10-06 MED FILL — SERTRALINE HCL 50 MG TABLET: 50 | 90 days supply | Qty: 90 | Fill #2

## 2019-10-06 MED FILL — PRASUGREL HCL 10 MG TABS: 10 | 90 days supply | Qty: 90 | Fill #2

## 2019-10-17 ENCOUNTER — Other Ambulatory Visit: Payer: 59

## 2019-10-17 ENCOUNTER — Other Ambulatory Visit: Payer: Self-pay

## 2019-10-17 DIAGNOSIS — I5032 Chronic diastolic (congestive) heart failure: Secondary | ICD-10-CM | POA: Diagnosis not present

## 2019-10-17 LAB — BASIC METABOLIC PANEL
BUN/Creatinine Ratio: 18 (ref 12–28)
BUN: 19 mg/dL (ref 8–27)
CO2: 22 mmol/L (ref 20–29)
Calcium: 9.2 mg/dL (ref 8.7–10.3)
Chloride: 102 mmol/L (ref 96–106)
Creatinine, Ser: 1.03 mg/dL — ABNORMAL HIGH (ref 0.57–1.00)
GFR calc Af Amer: 67 mL/min/{1.73_m2} (ref >59)
GFR calc non Af Amer: 58 mL/min/{1.73_m2} — ABNORMAL LOW (ref >59)
Glucose: 172 mg/dL — ABNORMAL HIGH (ref 65–99)
Potassium: 5.3 mmol/L — ABNORMAL HIGH (ref 3.5–5.2)
Sodium: 136 mmol/L (ref 134–144)

## 2019-10-26 ENCOUNTER — Encounter: Payer: 59 | Admitting: Cardiothoracic Surgery

## 2019-11-03 MED FILL — glipiZIDE ER 10 MG TB24: 10 | 90 days supply | Qty: 90 | Fill #0

## 2019-11-03 MED FILL — REPATHA SURECLICK 140 MG/ML: 140 | 28 days supply | Qty: 2 | Fill #7

## 2019-11-03 MED FILL — SPIRONOLACTONE 25 MG TABLET: 25 | 45 days supply | Qty: 45 | Fill #1

## 2019-11-09 ENCOUNTER — Other Ambulatory Visit: Payer: Self-pay

## 2019-11-09 ENCOUNTER — Ambulatory Visit (INDEPENDENT_AMBULATORY_CARE_PROVIDER_SITE_OTHER): Payer: 59 | Admitting: Cardiothoracic Surgery

## 2019-11-09 ENCOUNTER — Encounter: Payer: Self-pay | Admitting: Cardiothoracic Surgery

## 2019-11-09 VITALS — BP 141/74 | HR 74 | Temp 97.5°F | Resp 20 | Ht 62.0 in | Wt 200.0 lb

## 2019-11-09 DIAGNOSIS — Z951 Presence of aortocoronary bypass graft: Secondary | ICD-10-CM

## 2019-11-09 DIAGNOSIS — Z952 Presence of prosthetic heart valve: Secondary | ICD-10-CM | POA: Diagnosis not present

## 2019-11-09 DIAGNOSIS — C3492 Malignant neoplasm of unspecified part of left bronchus or lung: Secondary | ICD-10-CM

## 2019-11-09 NOTE — Progress Notes (Signed)
[address]       ,[place] [address]             [phone]                  Emanuel A Moylan [place]assett Medical Record #[id number] Date of Birth: 31-Aug-1956  Referring VQ:[id number], Marlane Hatcher, NP Primary Cardiology: Primary Care:Christain Sacramento, MD  Chief Complaint:  Follow Up Visit OPERATIVE REPORT DATE OF PROCEDURE:  05/17/2018 PREOPERATIVE DIAGNOSES:  Severe aortic stenosis and coronary occlusive disease. POSTOPERATIVE DIAGNOSES: 1.  Severe aortic stenosis with a trileaflet aortic valve and coronary occlusive disease. 2.  Aortic stenosis with a trileaflet aortic valve 3.  Coronary occlusive disease. 4.  Left upper lobe adenocarcinoma of the lung. PROCEDURES PERFORMED:   1.  Aortic valve replacement with pericardial tissue valve, [employer] model 3300 TFX 21 mm, serial number [id number]. 2.  Coronary artery bypass grafting x2 with the left internal mammary to the left anterior descending coronary artery and reverse saphenous vein graft to the distal right coronary artery with right thigh endovein harvesting.   3.  Wedge resection, left upper lobe lung lesion.  Diagnosis 1. Lung, wedge biopsy/resection, Left Upper Lobe - ADENOCARCINOMA, WELL DIFFERENTIATED, SPANNING 1.2 CM. - SEE ONCOLOGY TABLE BELOW. 2. Lung, wedge biopsy/resection, Left Upper Lobe - ADENOCARCINOMA. 3. Lung, wedge biopsy/resection, Left Upper Lobe - BENIGN LUNG PARENCHYMA. - THERE IS NO EVIDENCE OF MALIGNANCY. 4. Heart valve leaflets - NODULAR FIBROSIS AND CALCIFICATION. Microscopic Comment 1. LUNG: Procedure: Wedge resection and additional margin resections Specimen Laterality: Left upper lobe Tumor Site: Left upper lobe Tumor Size: 1.2 cm (gross measurement) Tumor Focality: Unifocal Histologic Type: Adenocarcinoma, well differentiated Visceral Pleura Invasion: Not identified. Lymphovascular Invasion: Not identified Direct Invasion of Adjacent Structures: Not  identified. Margins: Greater than 0.2 cm to parenchymal margin Treatment Effect: N/A Regional Lymph Nodes: None examined Pathologic Stage Classification (pTNM, AJCC 8th Edition): pT1a, pNX Ancillary Studies: Can be performed upon clinician request Representative Tumor Block: 1B (JBK:kh 05/19/18)   History of Present Illness: Patient returns to the office in follow-up after aortic valve replacement and coronary artery bypass grafting for symptomatic severe aortic stenosis and coronary artery disease.  A groundglass opacity in the left upper lobe was also noted incidentally on preop CT scan, wedge resection of lung confirmed adenocarcinoma  Clinically this appeared to be stage I adenocarcinoma   Patient has returned to full-time work, denies any definite anginal symptoms.  She is recently started on spironolactone by cardiology she notes since this symptoms of shortness of breath and pedal edema have significantly improved.  Follow-up CT scan of the chest was done in November 2020  Zubrod Score: At the time of surgery this patient's most appropriate activity status/level should be described as: []     0    Normal activity, no symptoms [x]     1    Restricted in physical strenuous activity but ambulatory, able to do out light work []     2    Ambulatory and capable of self care, unable to do work activities, up and about                 >50 % of waking hours                                                                                   []   3    Only limited self care, in bed greater than 50% of waking hours []     4    Completely disabled, no self care, confined to bed or chair []     5    Moribund  Social History   Tobacco Use  Smoking Status Former Smoker  . Packs/day: 1.00  . Years: 10.00  . Pack years: 10.00  . Types: Cigarettes  . Quit date: 16  . Years since quitting: 39.1  Smokeless Tobacco Never Used  Tobacco Comment   restarted  about 5-6 yrs ago, then stopped        Allergies  Allergen Reactions  . Brilinta [Ticagrelor] Shortness Of Breath  . Lisinopril Cough  . Asa [Aspirin] Hives and Rash  . Blue Dyes (Parenteral) Hives  . Corn-Containing Products Hives  . Nsaids Hives and Rash  . Sugar-Protein-Starch Hives  . Tea Hives  . Rosuvastatin     myalgias  . Zetia [Ezetimibe]     myalgias  . Isosulfan Blue Rash  . Other Itching and Rash    CHG soap    Current Outpatient Medications  Medication Sig Dispense Refill  . acetaminophen (TYLENOL) 500 MG tablet Take 500-1,000 mg by mouth 2 (two) times daily as needed for moderate pain.    . carvedilol (COREG) 6.25 MG tablet TAKE 1 TABLET (6.25 MG TOTAL) BY MOUTH 2 (TWO) TIMES DAILY WITH A MEAL. 180 tablet 3  . celecoxib (CELEBREX) 200 MG capsule Take 200 mg by mouth as needed.     . cetirizine (ZYRTEC) 10 MG tablet Take 10 mg by mouth daily.    . Evolocumab (REPATHA SURECLICK) 419 MG/ML SOAJ Inject 1 pen into the skin every 14 (fourteen) days.    . famotidine (PEPCID) 20 MG tablet Take 20 mg by mouth at bedtime.    . fluticasone (FLONASE) 50 MCG/ACT nasal spray Place 1 spray into both nostrils daily as needed for allergies or rhinitis.    Marland Kitchen glipiZIDE (GLUCOTROL XL) 10 MG 24 hr tablet Take 10 mg by mouth daily with breakfast.     . Menthol-Methyl Salicylate (MUSCLE RUB EX) Apply 1 application topically as needed (for pain).    . metFORMIN (GLUCOPHAGE) 850 MG tablet Take 850 mg by mouth 2 (two) times daily with a meal.     . prasugrel (EFFIENT) 10 MG TABS tablet Take 1 tablet (10 mg total) by mouth daily. 90 tablet 2  . sertraline (ZOLOFT) 50 MG tablet Take 25 mg by mouth daily.     Marland Kitchen spironolactone (ALDACTONE) 25 MG tablet Take 1 tablet (25 mg total) by mouth daily. 45 tablet 3  . TRULICITY 3.79 KW/4.0XB SOPN Inject 0.75 mg into the skin once a week.   3  . losartan (COZAAR) 25 MG tablet Take 1 tablet (25 mg total) by mouth daily. 90 tablet 3  . TURMERIC CURCUMIN PO Take 1,200 mg by mouth daily.       No current facility-administered medications for this visit.       Physical Exam: BP (!) 141/74 (BP Location: Left Arm, Patient Position: Sitting, Cuff Size: Normal)   Pulse 74   Temp (!) 97.5 F (36.4 C) (Temporal)   Resp 20   Ht 5\' 2"  (1.575 m)   Wt 200 lb (90.7 kg)   SpO2 97% Comment: RA  BMI 36.58 kg/m  General appearance: alert, cooperative and no distress Head: Normocephalic, without obvious abnormality, atraumatic Neck: no adenopathy, no carotid bruit, no JVD, supple, symmetrical,  trachea midline and thyroid not enlarged, symmetric, no tenderness/mass/nodules Lymph nodes: Cervical, supraclavicular, and axillary nodes normal. Resp: clear to auscultation bilaterally Cardio: regular rate and rhythm, S1, S2 normal, no murmur, click, rub or gallop GI: soft, non-tender; bowel sounds normal; no masses,  no organomegaly Extremities: extremities normal, atraumatic, no cyanosis or edema and Homans sign is negative, no sign of DVT Neurologic: Grossly normal Sternal incision is well-healed  Diagnostic Studies & Laboratory data:         Recent Radiology Findings: CLINICAL DATA:  Lung cancer staging, status post wedge resection left upper lobe  EXAM: CT CHEST WITH CONTRAST  TECHNIQUE: Multidetector CT imaging of the chest was performed during intravenous contrast administration.  CONTRAST:  5mL OMNIPAQUE IOHEXOL 300 MG/ML  SOLN  COMPARISON:  01/09/2019  FINDINGS: Cardiovascular: Aortic atherosclerosis. Normal heart size. Status post aortic valve replacement. Extensive 3 vessel coronary artery calcifications and/or stents. No pericardial effusion.  Mediastinum/Nodes: No enlarged mediastinal, hilar, or axillary lymph nodes. Thyroid gland, trachea, and esophagus demonstrate no significant findings.  Lungs/Pleura: Stable postoperative findings of left upper lobe wedge resection. Unchanged, scattered small mostly ground-glass nodules, primarily of the dependent  right lung (series 7, image 33, 56). Dependent scarring of the right lower lobe. No pleural effusion or pneumothorax.  Upper Abdomen: No acute abnormality.  Hepatic steatosis.  Musculoskeletal: No chest wall mass or suspicious bone lesions identified.  IMPRESSION: 1. Stable postoperative findings of left upper lobe wedge resection. No evidence of malignant recurrence or metastatic disease in the chest.  2. Unchanged, scattered small mostly ground-glass nodules, primarily of the dependent right lung (series 7, image 33, 56), appearance and distribution likely infectious or inflammatory. Attention on follow-up.  3.  Coronary artery disease.  Aortic Atherosclerosis (ICD10-I70.0).  4.  Hepatic steatosis.   Electronically Signed   By: Eddie Candle M.D.   On: 07/13/2019 14:09  CLINICAL DATA:  Non-small-cell lung cancer of the left upper lobe. Status post wedge resection.  EXAM: CT CHEST WITH CONTRAST  TECHNIQUE: Multidetector CT imaging of the chest was performed during intravenous contrast administration.  CONTRAST:  40mL OMNIPAQUE IOHEXOL 300 MG/ML  SOLN  COMPARISON:  PET-CT 06/29/2018.  FINDINGS: Cardiovascular: The heart size is normal. No substantial pericardial effusion. Status post AVR. Coronary artery calcification is evident. Atherosclerotic calcification is noted in the wall of the thoracic aorta.  Mediastinum/Nodes: No mediastinal lymphadenopathy. There is no hilar lymphadenopathy. The esophagus has normal imaging features. There is no axillary lymphadenopathy.  Lungs/Pleura: The central tracheobronchial airways are patent. 4 mm right upper lobe subpleural nodule is unchanged in the interval. Stable 5 mm ground-glass nodule in the lateral right lung (67/7). Architectural distortion and scarring noted left upper lobe with associated staple line. Soft tissue attenuation associated with the staple line has decreased in the interval. No new  suspicious pulmonary nodule or mass on today's study. No pleural effusion. Scarring noted medial right lower lobe.  Upper Abdomen: Small focus of subcapsular enhancement in the posterior right liver (142/2) is stable since 04/15/2018. This is probably a vascular malformation. Cortical scarring noted in the incompletely visualized kidneys.  Musculoskeletal: No worrisome lytic or sclerotic osseous abnormality.  IMPRESSION: 1. Interval decrease in the volume of soft tissue associated with the left upper lobe surgical site. 2. Stable tiny right lung nodules. Continued attention on follow-up recommended. 3. No change small focus of subcapsular hyper enhancement in the posterior right liver comparing back to 04/15/2018. Probably vascular malformation. This could also be reassessed at the  time of follow-up imaging. 4.  Aortic Atherosclerois (ICD10-170.0)   Electronically Signed   By: Misty Stanley M.D.   On: 01/10/2019 08:53    I have independently reviewed the above radiology studies  and reviewed the findings with the patient.   CLINICAL DATA:  Initial treatment strategy for pulmonary nodule.  EXAM: NUCLEAR MEDICINE PET SKULL BASE TO THIGH  TECHNIQUE: 9.6 mCi F-18 FDG was injected intravenously. Full-ring PET imaging was performed from the skull base to thigh after the radiotracer. CT data was obtained and used for attenuation correction and anatomic localization.  Fasting blood glucose: 128 mg/dl  COMPARISON:  Chest CT 04/15/2018  FINDINGS: Mediastinal blood pool activity: SUV max 2.9  NECK: No hypermetabolic lymph nodes in the neck.  Incidental CT findings: none  CHEST: Interval midline sternotomy and CABG. Moderate metabolic activity along the sternum related to postsurgical inflammation.  Interval LEFT upper lobe wedge resection. There is staple line with parenchymal thickening in the LEFT upper lobe. There is mild metabolic activity along this  surgical margin with SUV max equal 3.9 which is felt benign postsurgical change. No discrete hypermetabolic nodule.  No hypermetabolic mediastinal lymph nodes. There is a focus of atelectasis along the left cardiac border with SUV max equal 4.0 (image 81) which is also favored benign.  Incidental CT findings: none  ABDOMEN/PELVIS: No abnormal hypermetabolic activity within the liver, pancreas, adrenal glands, or spleen. No hypermetabolic lymph nodes in the abdomen or pelvis.  Intense physiologic activity in the bowel.  Incidental CT findings: Uterus and ovaries normal  SKELETON: No focal hypermetabolic activity to suggest skeletal metastasis.  Incidental CT findings: none  IMPRESSION: 1. Postsurgical change in the LEFT upper lobe without clear evidence of carcinoma. Recommend follow-up CT surveillance. 2. No evidence of metastatic disease. 3. Interval sternotomy and CABG   Electronically Signed   By: Suzy Bouchard M.D.   On: 06/30/2018 08:20   Recent Labs: Lab Results  Component Value Date   WBC 9.1 07/13/2019   HGB 14.0 07/13/2019   HCT 43.2 07/13/2019   PLT 234 07/13/2019   GLUCOSE 172 (H) 10/17/2019   CHOL 95 (L) 06/05/2019   TRIG 151 (H) 06/05/2019   HDL 27 (L) 06/05/2019   LDLCALC 42 06/05/2019   ALT 13 07/13/2019   AST 12 (L) 07/13/2019   NA 136 10/17/2019   K 5.3 (H) 10/17/2019   CL 102 10/17/2019   CREATININE 1.03 (H) 10/17/2019   BUN 19 10/17/2019   CO2 22 10/17/2019   INR 1.53 05/17/2018   HGBA1C 9.3 (H) 05/12/2018      Assessment / Plan:  #1 status post coronary artery bypass grafting and aortic valve replacement- stable post op course.  Patient was again reminded about antibiotic dental prophylaxis and the need for good dental care.   #2 non-small cell carcinoma of the lung well differentiated 1.2 cm left upper lobe -without evidence of recurrence on CT scan November 2020-follow-up scan ordered by oncology for May of this  year  Plan see the patient back in 6 months  Grace Isaac 11/09/2019 10:41 AM

## 2019-12-01 MED FILL — LOSARTAN POTASSIUM 25 MG TA: 25 | 90 days supply | Qty: 90 | Fill #3

## 2019-12-22 ENCOUNTER — Other Ambulatory Visit: Payer: Self-pay | Admitting: Interventional Cardiology

## 2019-12-22 MED FILL — REPATHA SURECLICK 140 MG/ML: 140 | 84 days supply | Qty: 6 | Fill #0

## 2019-12-22 MED FILL — TRULICITY 0.75 MG/0.5 ML PE: 0.75 | 84 days supply | Qty: 6 | Fill #2

## 2019-12-22 MED FILL — CELECOXIB 200 MG CAP: 200 | 90 days supply | Qty: 90 | Fill #3

## 2019-12-22 MED FILL — CARVEDILOL 6.25 MG TABLET: 6.25 | 90 days supply | Qty: 180 | Fill #1

## 2019-12-22 MED FILL — METFORMIN HCL 850 MG TABS: 850 | 90 days supply | Qty: 180 | Fill #1

## 2019-12-22 MED FILL — SPIRONOLACTONE 25 MG TABS: 25 | 45 days supply | Qty: 45 | Fill #2

## 2019-12-22 MED FILL — SERTRALINE HCL 50 MG TABLET: 50 | 90 days supply | Qty: 90 | Fill #3

## 2019-12-25 MED FILL — PRASUGREL HCL 10 MG TABS: 10 | 90 days supply | Qty: 90 | Fill #0

## 2020-01-11 ENCOUNTER — Encounter (HOSPITAL_COMMUNITY): Payer: Self-pay

## 2020-01-11 ENCOUNTER — Ambulatory Visit (HOSPITAL_COMMUNITY)
Admission: RE | Admit: 2020-01-11 | Discharge: 2020-01-11 | Disposition: A | Discharge status: Home / Self Care | Payer: 59 | Source: Ambulatory Visit | Attending: Internal Medicine | Admitting: Internal Medicine

## 2020-01-11 ENCOUNTER — Other Ambulatory Visit: Payer: Self-pay

## 2020-01-11 ENCOUNTER — Inpatient Hospital Stay: Payer: 59 | Attending: Internal Medicine

## 2020-01-11 DIAGNOSIS — C3490 Malignant neoplasm of unspecified part of unspecified bronchus or lung: Secondary | ICD-10-CM | POA: Diagnosis not present

## 2020-01-11 DIAGNOSIS — Z7984 Long term (current) use of oral hypoglycemic drugs: Secondary | ICD-10-CM | POA: Insufficient documentation

## 2020-01-11 DIAGNOSIS — Z902 Acquired absence of lung [part of]: Secondary | ICD-10-CM | POA: Insufficient documentation

## 2020-01-11 DIAGNOSIS — E119 Type 2 diabetes mellitus without complications: Secondary | ICD-10-CM | POA: Insufficient documentation

## 2020-01-11 DIAGNOSIS — E785 Hyperlipidemia, unspecified: Secondary | ICD-10-CM | POA: Insufficient documentation

## 2020-01-11 DIAGNOSIS — I252 Old myocardial infarction: Secondary | ICD-10-CM | POA: Insufficient documentation

## 2020-01-11 DIAGNOSIS — F329 Major depressive disorder, single episode, unspecified: Secondary | ICD-10-CM | POA: Insufficient documentation

## 2020-01-11 DIAGNOSIS — Z791 Long term (current) use of non-steroidal anti-inflammatories (NSAID): Secondary | ICD-10-CM | POA: Insufficient documentation

## 2020-01-11 DIAGNOSIS — I2510 Atherosclerotic heart disease of native coronary artery without angina pectoris: Secondary | ICD-10-CM | POA: Insufficient documentation

## 2020-01-11 DIAGNOSIS — Z79899 Other long term (current) drug therapy: Secondary | ICD-10-CM | POA: Insufficient documentation

## 2020-01-11 DIAGNOSIS — Z85118 Personal history of other malignant neoplasm of bronchus and lung: Secondary | ICD-10-CM | POA: Insufficient documentation

## 2020-01-11 LAB — CMP (CANCER CENTER ONLY)
ALT: 21 U/L (ref 0–44)
AST: 15 U/L (ref 15–41)
Albumin: 3.8 g/dL (ref 3.5–5.0)
Alkaline Phosphatase: 99 U/L (ref 38–126)
Anion gap: 10 (ref 5–15)
BUN: 15 mg/dL (ref 8–23)
CO2: 24 mmol/L (ref 22–32)
Calcium: 9 mg/dL (ref 8.9–10.3)
Chloride: 106 mmol/L (ref 98–111)
Creatinine: 0.93 mg/dL (ref 0.44–1.00)
GFR, Est AFR Am: >60 mL/min (ref >60)
GFR, Estimated: >60 mL/min (ref >60)
Glucose, Bld: 109 mg/dL — ABNORMAL HIGH (ref 70–99)
Potassium: 5.3 mmol/L — ABNORMAL HIGH (ref 3.5–5.1)
Sodium: 140 mmol/L (ref 135–145)
Total Bilirubin: 0.4 mg/dL (ref 0.3–1.2)
Total Protein: 7.2 g/dL (ref 6.5–8.1)

## 2020-01-11 LAB — CBC WITH DIFFERENTIAL (CANCER CENTER ONLY)
Abs Immature Granulocytes: 0.02 10*3/uL (ref 0.00–0.07)
Basophils Absolute: 0.1 10*3/uL (ref 0.0–0.1)
Basophils Relative: 1 %
Eosinophils Absolute: 0.3 10*3/uL (ref 0.0–0.5)
Eosinophils Relative: 3 %
HCT: 41.6 % (ref 36.0–46.0)
Hemoglobin: 13.4 g/dL (ref 12.0–15.0)
Immature Granulocytes: 0 %
Lymphocytes Relative: 37 %
Lymphs Abs: 3.2 10*3/uL (ref 0.7–4.0)
MCH: 30.5 pg (ref 26.0–34.0)
MCHC: 32.2 g/dL (ref 30.0–36.0)
MCV: 94.8 fL (ref 80.0–100.0)
Monocytes Absolute: 0.7 10*3/uL (ref 0.1–1.0)
Monocytes Relative: 9 %
Neutro Abs: 4.2 10*3/uL (ref 1.7–7.7)
Neutrophils Relative %: 50 %
Platelet Count: 214 10*3/uL (ref 150–400)
RBC: 4.39 MIL/uL (ref 3.87–5.11)
RDW: 12.7 % (ref 11.5–15.5)
WBC Count: 8.5 10*3/uL (ref 4.0–10.5)
nRBC: 0 % (ref 0.0–0.2)

## 2020-01-11 MED ORDER — IOHEXOL 300 MG/ML  SOLN
75.0000 mL | Freq: Once | INTRAMUSCULAR | Status: AC | PRN
  Administered 2020-01-11: 13:00:00 75 mL via INTRAVENOUS

## 2020-01-11 MED ORDER — SODIUM CHLORIDE (PF) 0.9 % IJ SOLN
INTRAMUSCULAR | Status: AC
  Filled 2020-01-11: qty 50

## 2020-01-12 ENCOUNTER — Inpatient Hospital Stay: Payer: 59

## 2020-01-15 ENCOUNTER — Other Ambulatory Visit: Payer: Self-pay

## 2020-01-15 ENCOUNTER — Encounter: Payer: Self-pay | Admitting: Internal Medicine

## 2020-01-15 ENCOUNTER — Inpatient Hospital Stay (HOSPITAL_BASED_OUTPATIENT_CLINIC_OR_DEPARTMENT_OTHER): Payer: 59 | Admitting: Internal Medicine

## 2020-01-15 VITALS — BP 148/78 | HR 67 | Temp 98.7°F | Resp 18 | Ht 62.0 in | Wt 205.7 lb

## 2020-01-15 DIAGNOSIS — C3492 Malignant neoplasm of unspecified part of left bronchus or lung: Secondary | ICD-10-CM | POA: Diagnosis not present

## 2020-01-15 DIAGNOSIS — E119 Type 2 diabetes mellitus without complications: Secondary | ICD-10-CM | POA: Diagnosis not present

## 2020-01-15 DIAGNOSIS — F329 Major depressive disorder, single episode, unspecified: Secondary | ICD-10-CM | POA: Diagnosis not present

## 2020-01-15 DIAGNOSIS — Z791 Long term (current) use of non-steroidal anti-inflammatories (NSAID): Secondary | ICD-10-CM | POA: Diagnosis not present

## 2020-01-15 DIAGNOSIS — E785 Hyperlipidemia, unspecified: Secondary | ICD-10-CM | POA: Diagnosis not present

## 2020-01-15 DIAGNOSIS — Z902 Acquired absence of lung [part of]: Secondary | ICD-10-CM | POA: Diagnosis not present

## 2020-01-15 DIAGNOSIS — C3490 Malignant neoplasm of unspecified part of unspecified bronchus or lung: Secondary | ICD-10-CM

## 2020-01-15 DIAGNOSIS — Z7984 Long term (current) use of oral hypoglycemic drugs: Secondary | ICD-10-CM | POA: Diagnosis not present

## 2020-01-15 DIAGNOSIS — Z79899 Other long term (current) drug therapy: Secondary | ICD-10-CM | POA: Diagnosis not present

## 2020-01-15 DIAGNOSIS — I252 Old myocardial infarction: Secondary | ICD-10-CM | POA: Diagnosis not present

## 2020-01-15 DIAGNOSIS — I2510 Atherosclerotic heart disease of native coronary artery without angina pectoris: Secondary | ICD-10-CM | POA: Diagnosis not present

## 2020-01-15 DIAGNOSIS — Z85118 Personal history of other malignant neoplasm of bronchus and lung: Secondary | ICD-10-CM | POA: Diagnosis not present

## 2020-01-15 NOTE — Progress Notes (Signed)
[place] [phone]   [phone]  OFFICE PROGRESS NOTE  Christain Sacramento, MD [address] [place] [address]  DIAGNOSIS: Stage IA (T1 a, Nx, Mx) non-small cell lung cancer, adenocarcinoma measuring 1.2 cm   PRIOR THERAPY: Status post wedge resection of the left upper lobe on May 24, 2018 under the care of Dr. Servando Snare.  CURRENT THERAPY: Observation.  INTERVAL HISTORY: Sarah Stevenson 64 y.o. female returns to the clinic today for 6 months follow-up visit.  The patient is feeling fine today with no concerning complaints.  She is planning for retirement later this week.  She denied having any chest pain, shortness of breath, cough or hemoptysis.  She denied having any recent weight loss or night sweats.  She has no nausea, vomiting, diarrhea or constipation.  She is here today for evaluation with repeat CT scan of the chest for restaging of her disease.   MEDICAL HISTORY: Past Medical History:  Diagnosis Date  . Anginal pain ([facility])   . Aortic stenosis   . Coronary artery disease   . Depression    "mild" (08/06/2016)  . Dyspnea   . Hyperlipidemia   . lung ca dx'd 05/2018  . Murmur, cardiac   . Myocardial infarction ([facility]) 2016  . Osteoarthritis    "left hip and knee" (08/06/2016)  . Type II diabetes mellitus ([facility])     ALLERGIES:  is allergic to brilinta [ticagrelor]; lisinopril; asa [aspirin]; blue dyes (parenteral); corn-containing products; nsaids; sugar-protein-starch; tea; rosuvastatin; zetia [ezetimibe]; isosulfan blue; and other.  MEDICATIONS:  Current Outpatient Medications  Medication Sig Dispense Refill  . glucose blood (ONETOUCH ULTRA) test strip TEST BLOOD SUGAR ONCE A DAY    . acetaminophen (TYLENOL) 500 MG tablet Take 500-1,000 mg by mouth 2 (two) times daily as needed for moderate pain.    . carvedilol (COREG) 6.25 MG tablet TAKE 1 TABLET (6.25 MG TOTAL) BY MOUTH 2 (TWO) TIMES DAILY WITH A MEAL. 180 tablet 3    . celecoxib (CELEBREX) 200 MG capsule Take 200 mg by mouth as needed.     . cetirizine (ZYRTEC) 10 MG tablet Take 10 mg by mouth daily.    . famotidine (PEPCID) 20 MG tablet Take 20 mg by mouth at bedtime.    . fluticasone (FLONASE) 50 MCG/ACT nasal spray Place 1 spray into both nostrils daily as needed for allergies or rhinitis.    Marland Kitchen glipiZIDE (GLUCOTROL XL) 10 MG 24 hr tablet Take 10 mg by mouth daily with breakfast.     . losartan (COZAAR) 25 MG tablet Take 1 tablet (25 mg total) by mouth daily. 90 tablet 3  . Menthol-Methyl Salicylate (MUSCLE RUB EX) Apply 1 application topically as needed (for pain).    . metFORMIN (GLUCOPHAGE) 850 MG tablet Take 850 mg by mouth 2 (two) times daily with a meal.     . prasugrel (EFFIENT) 10 MG TABS tablet TAKE 1 TABLET (10 MG TOTAL) BY MOUTH DAILY. 90 tablet 1  . REPATHA SURECLICK 258 MG/ML SOAJ INJECT 140MG  INTO THE SKIN EVERY 14 DAYS 6 mL 3  . sertraline (ZOLOFT) 50 MG tablet Take 25 mg by mouth daily.     Marland Kitchen spironolactone (ALDACTONE) 25 MG tablet Take 1 tablet (25 mg total) by mouth daily. 45 tablet 3  . TRULICITY 5.27 PO/2.4MP SOPN Inject 0.75 mg into the skin once a week.   3  . TURMERIC CURCUMIN PO Take 1,200 mg by mouth  daily.      No current facility-administered medications for this visit.    SURGICAL HISTORY:  Past Surgical History:  Procedure Laterality Date  . AORTIC VALVE REPLACEMENT N/A 05/17/2018   Procedure: AORTIC VALVE REPLACEMENT (AVR) USING 21 MM MAGNA EASE PERICARDIAL BIOPROSTHESIS. MODEL # [id number]. SERIAL # [id number];  Surgeon: Grace Isaac, MD;  Location: [facility];  Service: Open Heart Surgery;  Laterality: N/A;  . CARDIAC CATHETERIZATION Right 11/30/2014   Procedure: RIGHT/LEFT HEART CATH AND CORONARY ANGIOGRAPHY;  Surgeon: Belva Crome, MD;  Location: [facility] CATH LAB;  Service: Cardiovascular;  Laterality: Right;  . CARDIAC CATHETERIZATION N/A 12/13/2015   Procedure: Left Heart Cath and Coronary Angiography;  Surgeon: Leonie Man, MD;  Location: [facility] CV LAB;  Service: Cardiovascular;  Laterality: N/A;  . CARDIAC CATHETERIZATION N/A 12/13/2015   Procedure: Intravascular Pressure Wire/FFR Study;  Surgeon: Leonie Man, MD;  Location: [facility] CV LAB;  Service: Cardiovascular;  Laterality: N/A;  RCA  . CARDIAC CATHETERIZATION N/A 08/06/2016   Procedure: Right/Left Heart Cath and Coronary Angiography;  Surgeon: Lorretta Harp, MD;  Location: [facility] CV LAB;  Service: Cardiovascular;  Laterality: N/A;  . CARDIAC CATHETERIZATION N/A 08/06/2016   Procedure: Coronary Stent Intervention;  Surgeon: Lorretta Harp, MD;  Location: [facility] CV LAB;  Service: Cardiovascular;  Laterality: N/A;   distal rca 3.0x16 and 3.0x8 synergy  . CESAREAN SECTION  1989  . CORONARY ANGIOPLASTY WITH STENT PLACEMENT  08/06/2016   "2 stents"  . CORONARY ARTERY BYPASS GRAFT N/A 05/17/2018   Procedure: CORONARY ARTERY BYPASS GRAFTING (CABG) X 2 WITH ENDOSCOPIC HARVESTING OF RIGHT SAPHENOUS VEIN. LIMA TO LAD. SVG TO RCA;  Surgeon: Grace Isaac, MD;  Location: [facility];  Service: Open Heart Surgery;  Laterality: N/A;  . HYSTEROSCOPY DIAGNOSTIC  1990s  . JOINT REPLACEMENT    . KNEE ARTHROSCOPY Left 1996  . NASAL SINUS SURGERY  1977  . RIGHT/LEFT HEART CATH AND CORONARY ANGIOGRAPHY N/A 04/01/2018   Procedure: RIGHT/LEFT HEART CATH AND CORONARY ANGIOGRAPHY;  Surgeon: Belva Crome, MD;  Location: [facility] CV LAB;  Service: Cardiovascular;  Laterality: N/A;  . TEE WITHOUT CARDIOVERSION N/A 05/17/2018   Procedure: TRANSESOPHAGEAL ECHOCARDIOGRAM (TEE);  Surgeon: Grace Isaac, MD;  Location: [facility];  Service: Open Heart Surgery;  Laterality: N/A;  . TOTAL HIP ARTHROPLASTY Left 04/23/2017   Procedure: LEFT TOTAL HIP ARTHROPLASTY ANTERIOR APPROACH;  Surgeon: Mcarthur Rossetti, MD;  Location: [facility];  Service: Orthopedics;  Laterality: Left;  . WEDGE RESECTION Left 05/17/2018   Procedure: LEFT UPPER LOBE LUNG WEDGE  RESECTION;  Surgeon: Grace Isaac, MD;  Location: [facility];  Service: Open Heart Surgery;  Laterality: Left;    REVIEW OF SYSTEMS:  A comprehensive review of systems was negative.   PHYSICAL EXAMINATION: General appearance: alert, cooperative and no distress Head: Normocephalic, without obvious abnormality, atraumatic Neck: no adenopathy, no JVD, supple, symmetrical, trachea midline and thyroid not enlarged, symmetric, no tenderness/mass/nodules Lymph nodes: Cervical, supraclavicular, and axillary nodes normal. Resp: clear to auscultation bilaterally Back: symmetric, no curvature. ROM normal. No CVA tenderness. Cardio: regular rate and rhythm, S1, S2 normal, no murmur, click, rub or gallop GI: soft, non-tender; bowel sounds normal; no masses,  no organomegaly Extremities: extremities normal, atraumatic, no cyanosis or edema  ECOG PERFORMANCE STATUS: 1 - Symptomatic but completely ambulatory  Blood pressure (!) 148/78, pulse 67, temperature 98.7 F (37.1 C), temperature source Temporal, resp. rate 18, height 5'  2" (1.575 m), weight 205 lb 11.2 oz (93.3 kg), SpO2 98 %.  LABORATORY DATA: Lab Results  Component Value Date   WBC 8.5 01/11/2020   HGB 13.4 01/11/2020   HCT 41.6 01/11/2020   MCV 94.8 01/11/2020   PLT 214 01/11/2020      Chemistry      Component Value Date/Time   NA 140 01/11/2020 1133   NA 136 10/17/2019 0914   K 5.3 (H) 01/11/2020 1133   CL 106 01/11/2020 1133   CO2 24 01/11/2020 1133   BUN 15 01/11/2020 1133   BUN 19 10/17/2019 0914   CREATININE 0.93 01/11/2020 1133      Component Value Date/Time   CALCIUM 9.0 01/11/2020 1133   ALKPHOS 99 01/11/2020 1133   AST 15 01/11/2020 1133   ALT 21 01/11/2020 1133   BILITOT 0.4 01/11/2020 1133       RADIOGRAPHIC STUDIES: CT Chest W Contrast  Result Date: 01/11/2020 CLINICAL DATA:  Primary Cancer Type: Lung Imaging Indication: Routine surveillance Interval therapy since last imaging? No Initial Cancer Diagnosis  Date: 05/17/2018    established by: Biopsy-proven Detailed Pathology: Stage IA non-small cell lung cancer, adenocarcinoma, 1.2 cm. Primary Tumor location: Left upper lobe Surgeries: Left upper lobe wedge resection 05/24/2018. Chemotherapy: No Immunotherapy? No Radiation therapy? No EXAM: CT CHEST WITH CONTRAST TECHNIQUE: Multidetector CT imaging of the chest was performed during intravenous contrast administration. CONTRAST:  19mL OMNIPAQUE IOHEXOL 300 MG/ML  SOLN COMPARISON:  Most recent CT chest 07/13/2019.  06/29/2018 PET-CT. FINDINGS: Cardiovascular: Normal heart size. No significant pericardial effusion/thickening. Three-vessel coronary atherosclerosis status post CABG. Aortic valve prosthesis in place. Atherosclerotic nonaneurysmal thoracic aorta. Normal caliber pulmonary arteries. No central pulmonary emboli. Mediastinum/Nodes: No discrete thyroid nodules. Unremarkable esophagus. No pathologically enlarged axillary, mediastinal or hilar lymph nodes. Lungs/Pleura: No pneumothorax. No pleural effusion. Stable curvilinear parenchymal band in the left upper lobe along the wedge resection suture site. No acute consolidative airspace disease or lung masses. Solid 3 mm anterior right middle lobe pulmonary nodule (series 7/image 88) and scattered small posterior right upper lobe ground-glass pulmonary nodules up to 4 mm (series 7/image 75) are all stable. No new significant pulmonary nodules. Upper abdomen: No acute abnormality. Musculoskeletal: No aggressive appearing focal osseous lesions. Intact sternotomy wires. Moderate thoracic spondylosis. IMPRESSION: 1. No evidence of local tumor recurrence in the left upper lobe status post wedge resection. 2. No evidence of metastatic disease in the chest. 3. Tiny solid and ground-glass right pulmonary nodules are all stable. 4. Aortic Atherosclerosis (ICD10-I70.0). Electronically Signed   By: Ilona Sorrel M.D.   On: 01/11/2020 13:14    ASSESSMENT AND PLAN: This is a  very pleasant 64 years old white female diagnosed with a stage Ia non-small cell lung cancer status post wedge resection of the left upper lobe in September 2019 under the care of Dr. Servando Snare. The patient is currently on observation and she is feeling fine today with no concerning complaints. She had repeat CT scan of the chest performed recently.  I personally and independently reviewed the scans and discussed the results with the patient today. Her scan showed no concerning findings for disease recurrence or metastasis. I recommended for her to continue on observation with repeat CT scan of the chest in 6 months. The patient was advised to call immediately if she has any other concerning symptoms in the interval. The patient voices understanding of current disease status and treatment options and is in agreement with the current care plan.  All questions were answered. The patient knows to call the clinic with any problems, questions or concerns. We can certainly see the patient much sooner if necessary.   Disclaimer: This note was dictated with voice recognition software. Similar sounding words can inadvertently be transcribed and may not be corrected upon review.

## 2020-01-17 ENCOUNTER — Telehealth: Payer: Self-pay | Admitting: Internal Medicine

## 2020-01-17 NOTE — Telephone Encounter (Signed)
Scheduled per los. Called and left msg. Mailed printout  °

## 2020-01-26 MED FILL — GLIPIZIDE ER 10 MG TB24: 10 | 90 days supply | Qty: 90 | Fill #1

## 2020-02-02 MED FILL — SPIRONOLACTONE 25 MG TABS: 25 | 45 days supply | Qty: 45 | Fill #3

## 2020-02-13 ENCOUNTER — Other Ambulatory Visit (HOSPITAL_BASED_OUTPATIENT_CLINIC_OR_DEPARTMENT_OTHER): Payer: Self-pay | Admitting: Family Medicine

## 2020-02-13 DIAGNOSIS — I2510 Atherosclerotic heart disease of native coronary artery without angina pectoris: Secondary | ICD-10-CM | POA: Diagnosis not present

## 2020-02-13 DIAGNOSIS — I10 Essential (primary) hypertension: Secondary | ICD-10-CM | POA: Diagnosis not present

## 2020-02-13 DIAGNOSIS — E119 Type 2 diabetes mellitus without complications: Secondary | ICD-10-CM | POA: Diagnosis not present

## 2020-02-13 DIAGNOSIS — F418 Other specified anxiety disorders: Secondary | ICD-10-CM | POA: Diagnosis not present

## 2020-02-13 DIAGNOSIS — Z0000 Encounter for general adult medical examination without abnormal findings: Secondary | ICD-10-CM | POA: Diagnosis not present

## 2020-02-13 DIAGNOSIS — M2550 Pain in unspecified joint: Secondary | ICD-10-CM | POA: Diagnosis not present

## 2020-02-19 ENCOUNTER — Other Ambulatory Visit (HOSPITAL_BASED_OUTPATIENT_CLINIC_OR_DEPARTMENT_OTHER): Payer: Self-pay | Admitting: Family Medicine

## 2020-03-04 MED FILL — LOSARTAN POTASSIUM 25 MG TA: 25 | 90 days supply | Qty: 90 | Fill #0

## 2020-03-26 ENCOUNTER — Other Ambulatory Visit: Payer: Self-pay | Admitting: Interventional Cardiology

## 2020-03-26 MED FILL — PRASUGREL HCL 10 MG TABS: 10 | 90 days supply | Qty: 90 | Fill #1

## 2020-03-26 MED FILL — SERTRALINE HCL 50 MG TABLET: 50 | 90 days supply | Qty: 90 | Fill #0

## 2020-03-26 MED FILL — SPIRONOLACTONE 25 MG TABS: 25 | 45 days supply | Qty: 45 | Fill #0

## 2020-03-29 ENCOUNTER — Telehealth: Payer: Self-pay | Admitting: Interventional Cardiology

## 2020-03-29 NOTE — Telephone Encounter (Signed)
Patient states she has had an echo every year and would like to know if she needs to have another one before her appointment with Dr. Tamala Julian 9/24.

## 2020-03-29 NOTE — Telephone Encounter (Signed)
Returned call to pt.  She has been made aware that in the Echo results, it didn't state to repeat in 1 year and that if Dr. Tamala Julian wants to repeat it, they will order at her office visit.  Pt was grateful for the call back.

## 2020-04-04 ENCOUNTER — Telehealth: Payer: Self-pay | Admitting: *Deleted

## 2020-04-04 DIAGNOSIS — K760 Fatty (change of) liver, not elsewhere classified: Secondary | ICD-10-CM | POA: Diagnosis not present

## 2020-04-04 DIAGNOSIS — Z1211 Encounter for screening for malignant neoplasm of colon: Secondary | ICD-10-CM | POA: Diagnosis not present

## 2020-04-04 DIAGNOSIS — K219 Gastro-esophageal reflux disease without esophagitis: Secondary | ICD-10-CM | POA: Diagnosis not present

## 2020-04-04 NOTE — Telephone Encounter (Signed)
Spoke to pt to inform her of need for appt prior to procedure. Pt stated she is scheduled for appt with Dr. Tamala Julian on 9/24 for annual f/u. Added appt note to evaluate for cardiac clearance for colonoscopy in October. Pt verbalized thanks for the call.

## 2020-04-04 NOTE — Telephone Encounter (Signed)
Primary Cardiologist:Sarah Nicholes Stairs III, MD  Chart reviewed as part of pre-operative protocol coverage. Because of [name]'s past medical history and time since last visit, he/she will require a follow-up visit in order to better assess preoperative cardiovascular risk.  Pre-op covering staff: - Please schedule appointment and call patient to inform them. - Please contact requesting surgeon's office via preferred method (i.e, phone, fax) to inform them of need for appointment prior to surgery.  If applicable, this message will also be routed to pharmacy pool and/or primary cardiologist for input on holding anticoagulant/antiplatelet agent as requested below so that this information is available at time of patient's appointment.   Deberah Pelton, NP  04/04/2020, 3:21 PM

## 2020-04-04 NOTE — Telephone Encounter (Signed)
Please call pt to schedule appt for clearance either with Dr. Tamala Julian or with APP per Coletta Memos, NP. Procedure scheduled for 06/17/20.

## 2020-04-04 NOTE — Telephone Encounter (Signed)
   [facility] Pre-operative Risk Assessment    HEARTCARE STAFF: - Please ensure there is not already an duplicate clearance open for this procedure. - Under Visit Info/Reason for Call, type in Other and utilize the format Clearance MM/DD/YY or Clearance TBD. Do not use dashes or single digits. - If request is for dental extraction, please clarify the # of teeth to be extracted.  Request for surgical clearance:  1. What type of surgery is being performed? COLONOSCOPY   2. When is this surgery scheduled? 06/17/20   3. What type of clearance is required (medical clearance vs. Pharmacy clearance to hold med vs. Both)? MEDICAL  4. Are there any medications that need to be held prior to surgery and how long? EFFIENT   5. Practice name and name of physician performing surgery? [facility]; DR. MANN   6. What is the office phone number? [phone]   7.   What is the office fax number? [phone]  8.   Anesthesia type (None, local, MAC, general) ? PROPOFOL   Julaine Hua 04/04/2020, 2:23 PM  _________________________________________________________________   (provider comments below)

## 2020-04-04 NOTE — Telephone Encounter (Signed)
Faxed successfully via Epic Fax to requesting office.

## 2020-04-08 NOTE — Telephone Encounter (Signed)
Pt has appt 05/31/20 with Dr. Tamala Julian for pre op clearance. I will forward clearance notes to MD for upcoming appt. Will remove from the pre op call back pool.

## 2020-04-19 MED FILL — METFORMIN HCL 850 MG TABS: 850 | 90 days supply | Qty: 180 | Fill #2

## 2020-04-19 MED FILL — REPATHA SURECLICK 140 MG/ML: 140 | 84 days supply | Qty: 6 | Fill #1

## 2020-04-19 MED FILL — CARVEDILOL 6.25 MG TABLET: 6.25 | 90 days supply | Qty: 180 | Fill #2

## 2020-04-19 MED FILL — TRULICITY 0.75 MG/0.5 ML PE: 0.75 | 84 days supply | Qty: 6 | Fill #3

## 2020-04-30 IMAGING — DX DG CHEST 1V PORT
1 series · 1 of 1 positions shown · non-contrast
Comparison: 05/19/2018

CLINICAL DATA: Chest pain, history of coronary bypass grafting

EXAM:
PORTABLE CHEST 1 VIEW

[chest ap]
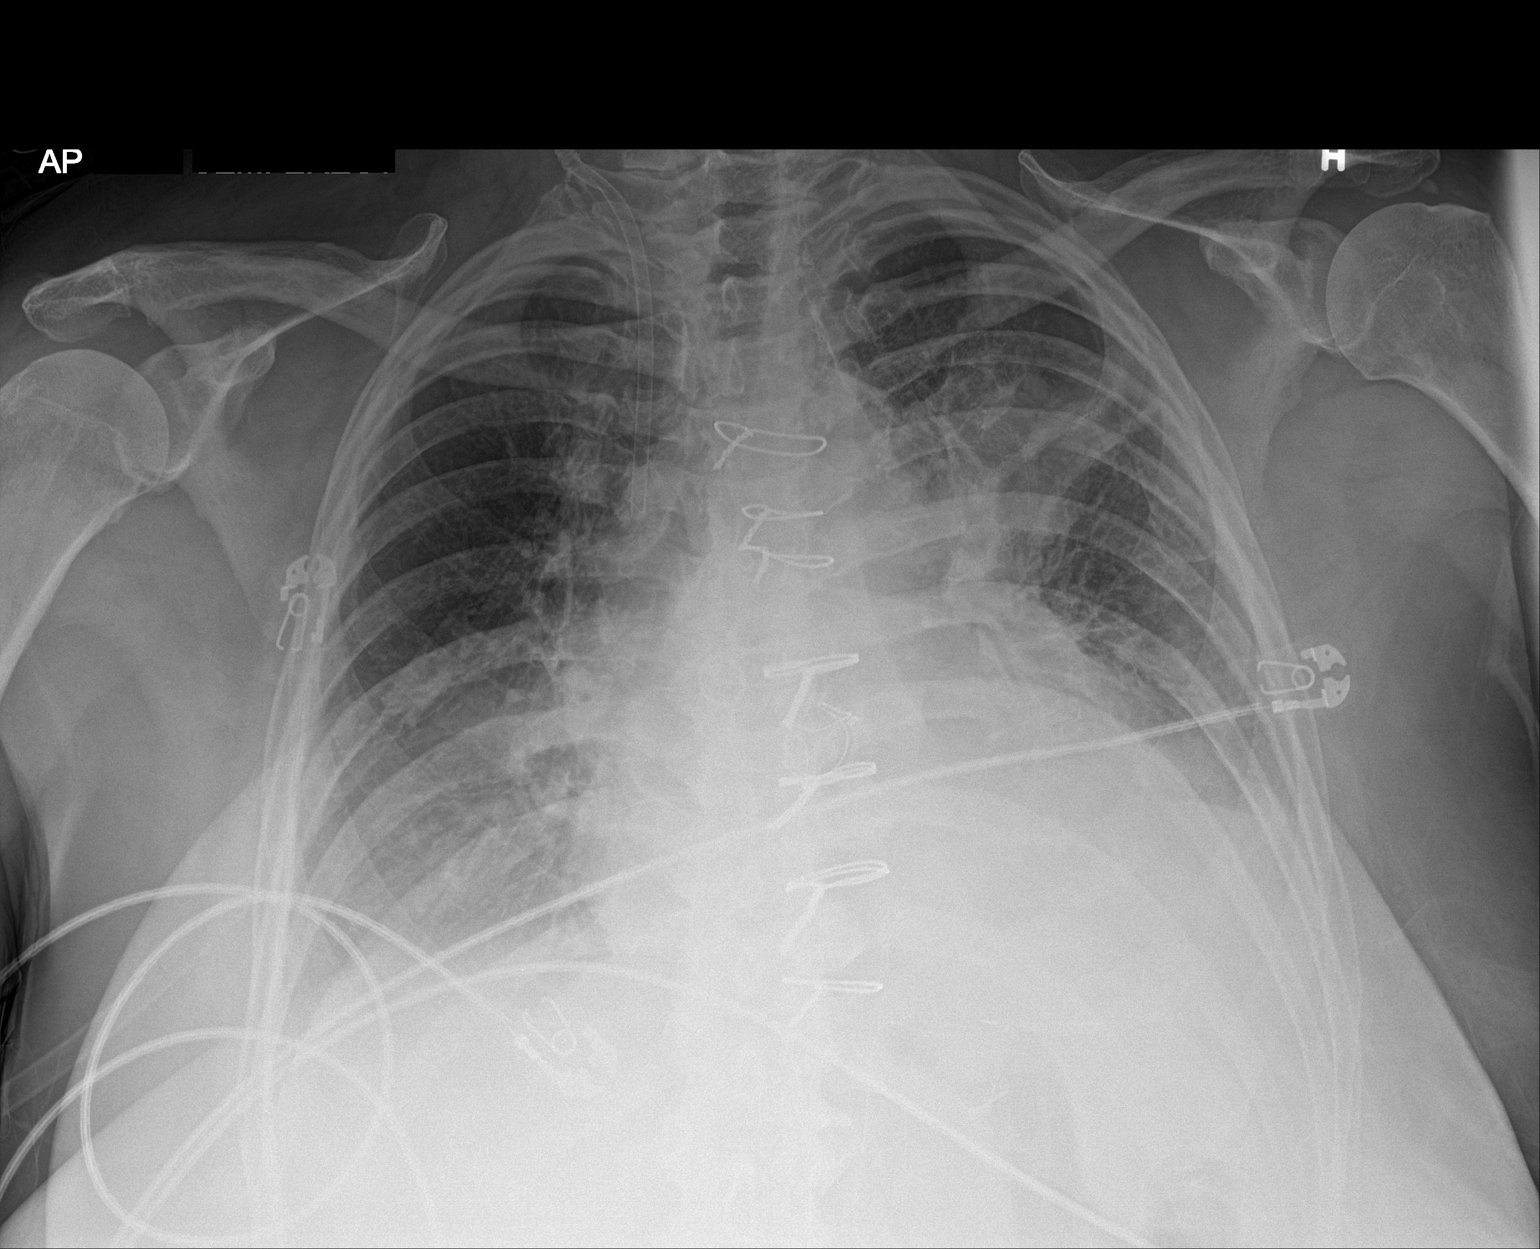

[1 of 1 positions shown; findings below may reference images not displayed]

FINDINGS: Cardiac shadow remains enlarged. Postsurgical changes are again
seen. Right jugular sheath remains in place. Mediastinal drain is
been removed. Mild right basilar atelectasis is noted new from the
prior exam. Small left pleural effusion is seen. Some increased
density associated with the recent surgery in the left upper lobe is
noted stable from the prior study.
IMPRESSION: Postoperative changes with increased density in the left upper lobe
stable from the prior exam.

Small left pleural effusion is again seen. Mild right basilar
atelectasis is noted.

## 2020-05-01 IMAGING — DX DG CHEST 2V
2 series · 2 of 2 positions shown · non-contrast
Comparison: the previous day's study

CLINICAL DATA: S/p CABG/AVR on [DATE]; pt reports increased SOB since
surgery; former smoker; hx of DM

EXAM:
CHEST - 2 VIEW

[chest pa]
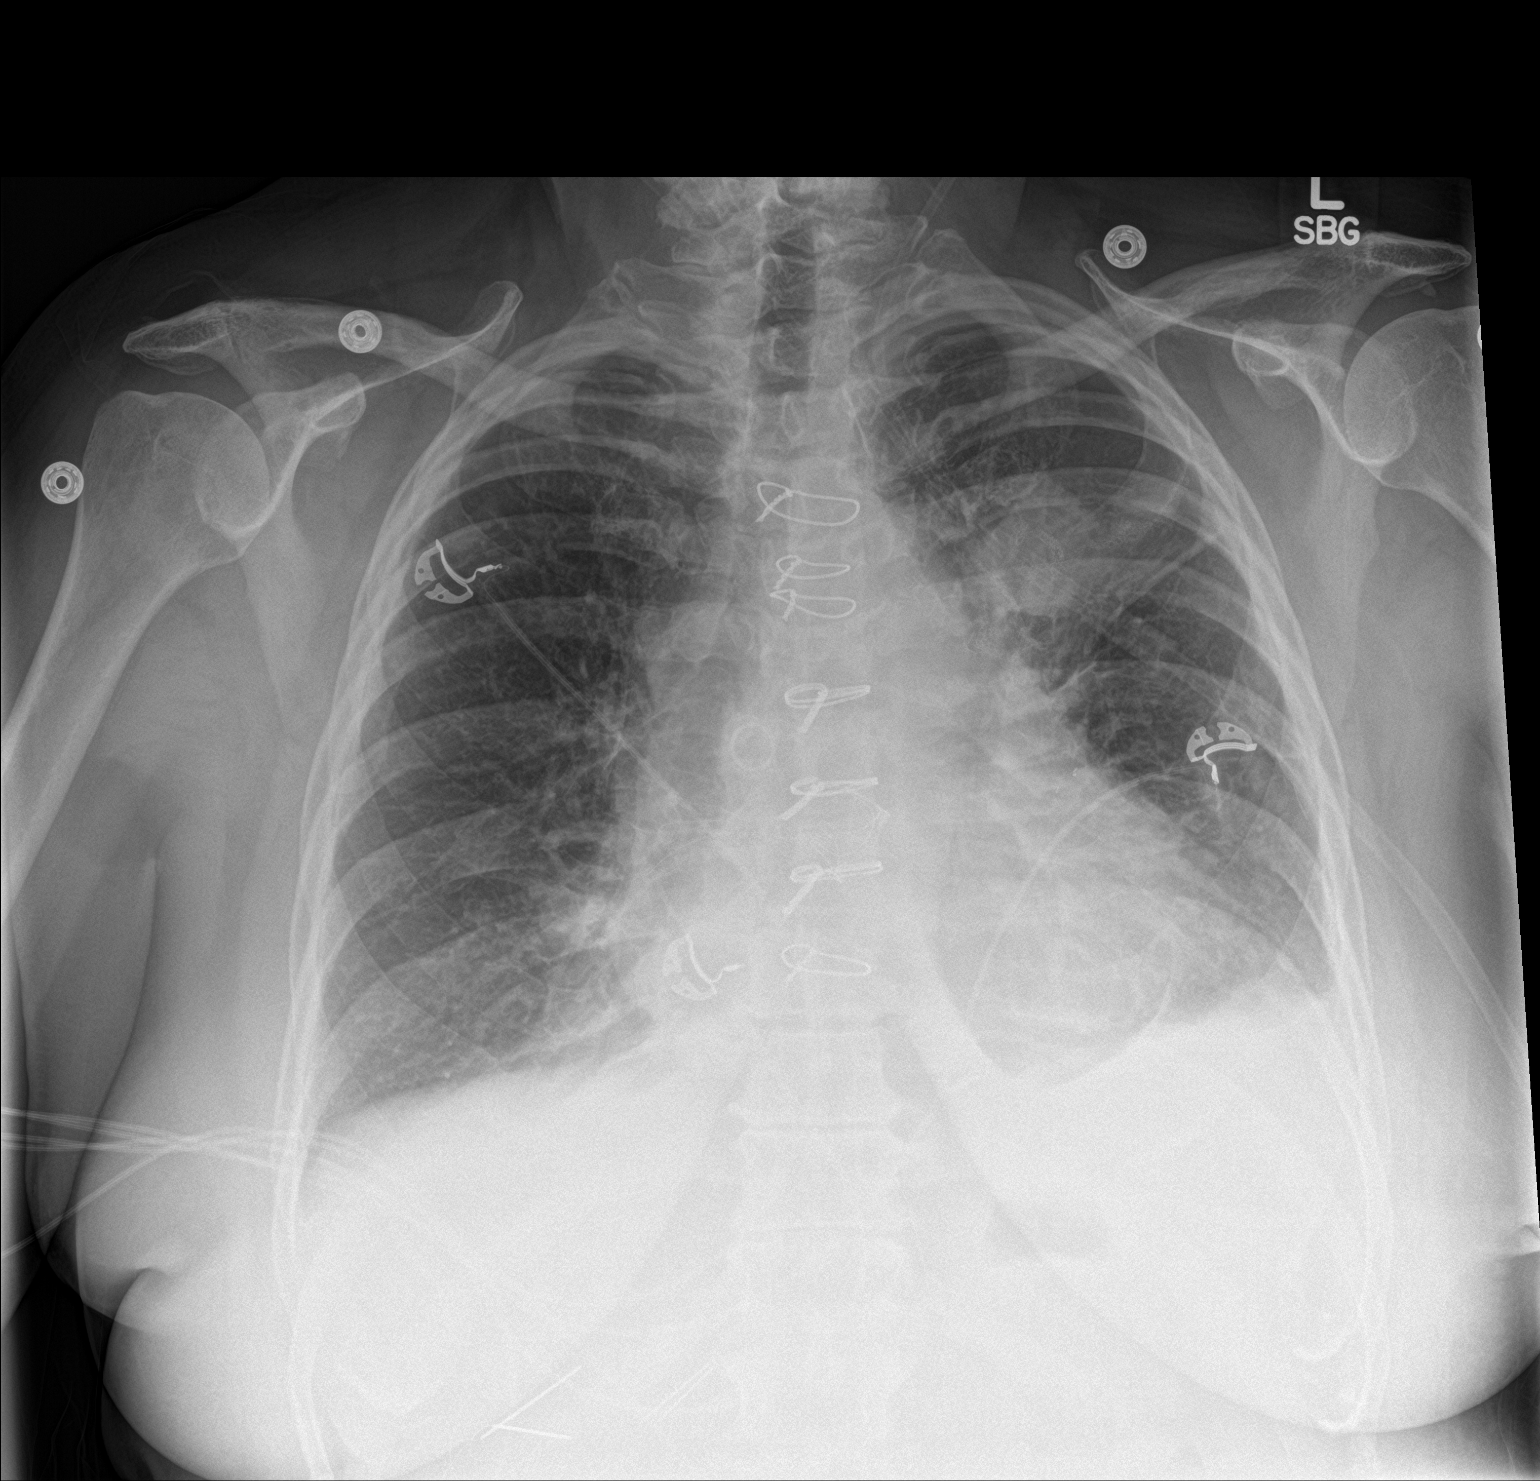

[chest lat]
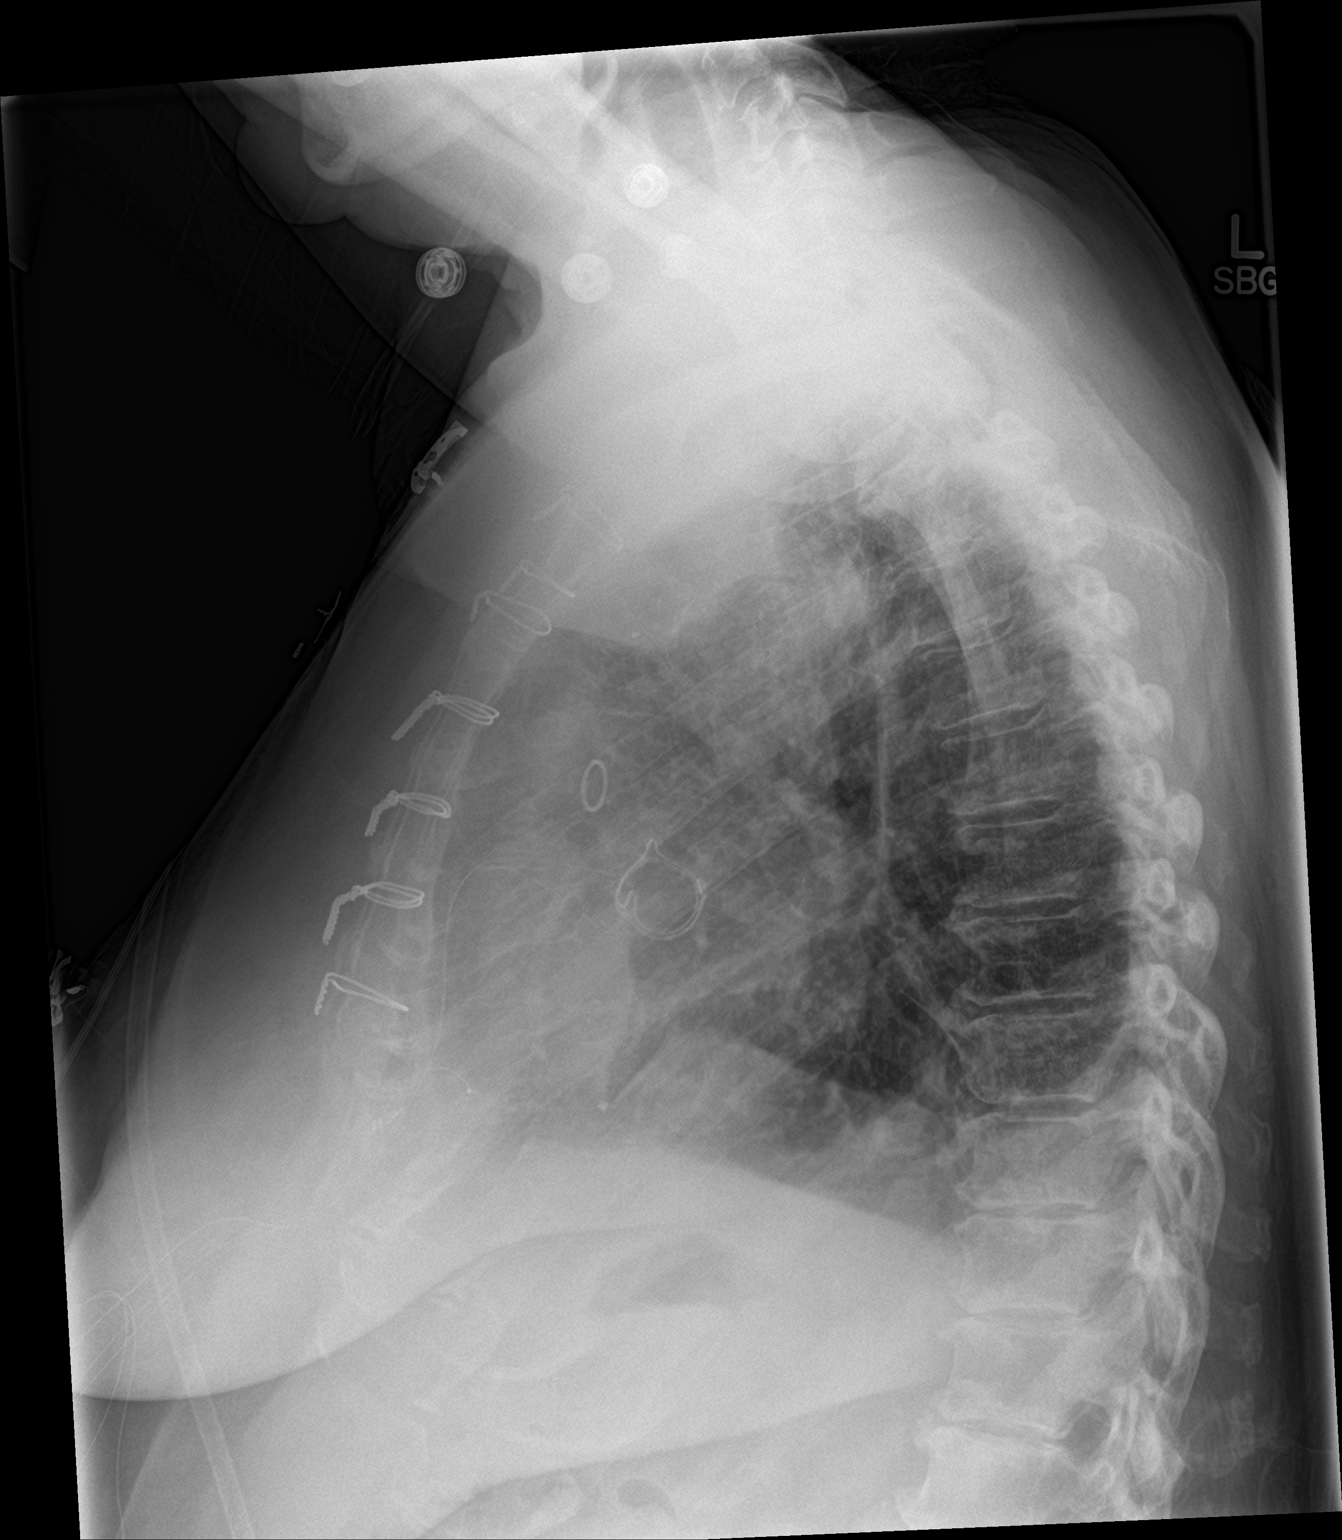

[2 of 2 positions shown; findings below may reference images not displayed]

FINDINGS: Interval removal of right IJ venous sheath. Improving left upper
lobe aeration with persistent suprahilar consolidation/atelectasis.
Small pleural effusions, left greater than right. Central pulmonary
vascular congestion with improvement in interstitial edema.

Stable cardiomegaly.  Changes of AVR and CABG.  No pneumothorax.

Previous median sternotomy. Anterior vertebral endplate spurring at
multiple levels in the mid and lower thoracic spine.
IMPRESSION: 1. Improving interstitial edema with central pulmonary vascular
congestion and stable cardiomegaly.
2. Improving left suprahilar airspace disease.
3. Persistent small effusions.

## 2020-05-08 MED FILL — GLIPIZIDE ER 10 MG TB24: 10 | 90 days supply | Qty: 90 | Fill #2

## 2020-05-08 MED FILL — SPIRONOLACTONE 25 MG TABS: 25 | 45 days supply | Qty: 45 | Fill #1

## 2020-05-08 MED FILL — CELECOXIB 200 MG CAP: 200 | 90 days supply | Qty: 90 | Fill #0

## 2020-05-23 ENCOUNTER — Ambulatory Visit (INDEPENDENT_AMBULATORY_CARE_PROVIDER_SITE_OTHER): Payer: 59 | Admitting: Cardiothoracic Surgery

## 2020-05-23 ENCOUNTER — Other Ambulatory Visit: Payer: Self-pay

## 2020-05-23 VITALS — BP 145/84 | HR 75 | Temp 97.6°F | Resp 20 | Ht 62.0 in | Wt 202.0 lb

## 2020-05-23 DIAGNOSIS — Z951 Presence of aortocoronary bypass graft: Secondary | ICD-10-CM

## 2020-05-23 DIAGNOSIS — C3492 Malignant neoplasm of unspecified part of left bronchus or lung: Secondary | ICD-10-CM

## 2020-05-23 DIAGNOSIS — Z952 Presence of prosthetic heart valve: Secondary | ICD-10-CM

## 2020-05-23 NOTE — Progress Notes (Signed)
[address]       [place] [address]             [phone]                  Kazia A [facility] [place] Medical Record #[id number] Date of Birth: [birth year]  Referring HU:[id number], Jama Flavors, MD Primary Cardiology:Dr Daneen Schick Primary Care:Christain Sacramento, MD Oncology: Dr Julien Nordmann   Chief Complaint:  Follow Up Visit OPERATIVE REPORT DATE OF PROCEDURE:  05/17/2018 PREOPERATIVE DIAGNOSES:  Severe aortic stenosis and coronary occlusive disease. POSTOPERATIVE DIAGNOSES: 1.  Severe aortic stenosis with a trileaflet aortic valve and coronary occlusive disease. 2.  Aortic stenosis with a trileaflet aortic valve 3.  Coronary occlusive disease. 4.  Left upper lobe adenocarcinoma of the lung. PROCEDURES PERFORMED:   1.  Aortic valve replacement with pericardial tissue valve, [employer] model 3300 TFX 21 mm, serial number [id number]. 2.  Coronary artery bypass grafting x2 with the left internal mammary to the left anterior descending coronary artery and reverse saphenous vein graft to the distal right coronary artery with right thigh endovein harvesting.   3.  Wedge resection, left upper lobe lung lesion.  Diagnosis 1. Lung, wedge biopsy/resection, Left Upper Lobe - ADENOCARCINOMA, WELL DIFFERENTIATED, SPANNING 1.2 CM. - SEE ONCOLOGY TABLE BELOW. 2. Lung, wedge biopsy/resection, Left Upper Lobe - ADENOCARCINOMA. 3. Lung, wedge biopsy/resection, Left Upper Lobe - BENIGN LUNG PARENCHYMA. - THERE IS NO EVIDENCE OF MALIGNANCY. 4. Heart valve leaflets - NODULAR FIBROSIS AND CALCIFICATION. Microscopic Comment 1. LUNG: Procedure: Wedge resection and additional margin resections Specimen Laterality: Left upper lobe Tumor Site: Left upper lobe Tumor Size: 1.2 cm (gross measurement) Tumor Focality: Unifocal Histologic Type: Adenocarcinoma, well differentiated Visceral Pleura Invasion: Not identified. Lymphovascular Invasion: Not identified Direct Invasion  of Adjacent Structures: Not identified. Margins: Greater than 0.2 cm to parenchymal margin Treatment Effect: N/A Regional Lymph Nodes: None examined Pathologic Stage Classification (pTNM, AJCC 8th Edition): pT1a, pNX Ancillary Studies: Can be performed upon clinician request Representative Tumor Block: 1B (JBK:kh 05/19/18)   History of Present Illness: Patient returns to the office for follow-up after aortic valve replacement coronary artery bypass for symptomatic severe aortic stenosis and coronary artery disease.  The groundglass opacity in the left upper lobe was incidentally noted on preop CT scan a wedge resection at the time of surgery confirmed the status adenocarcinoma.  The patient's had subsequent serial CT scans.  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  She notes she is able to do more than she had been doing including repairs and yard work around her house.      Zubrod Score: At the time of surgery this patient's most appropriate activity status/level should be described as: []     0    Normal activity, no symptoms [x]     1    Restricted in physical strenuous activity but ambulatory, able to do out light work []     2    Ambulatory and capable of self care, unable to do work activities, up and about                 >50 % of waking hours                                                                                   []   3    Only limited self care, in bed greater than 50% of waking hours []     4    Completely disabled, no self care, confined to bed or chair []     5    Moribund  Social History   Tobacco Use  Smoking Status Former Smoker  . Packs/day: 1.00  . Years: 10.00  . Pack years: 10.00  . Types: Cigarettes  . Quit date: 23  . Years since quitting: 39.7  Smokeless Tobacco Never Used  Tobacco Comment   restarted  about 5-6 yrs ago, then stopped       Allergies  Allergen Reactions  . Brilinta  [Ticagrelor] Shortness Of Breath  . Lisinopril Cough  . Asa [Aspirin] Hives and Rash  . Blue Dyes (Parenteral) Hives  . Corn-Containing Products Hives  . Nsaids Hives and Rash  . Sugar-Protein-Starch Hives  . Tea Hives  . Rosuvastatin     myalgias  . Zetia [Ezetimibe]     myalgias  . Isosulfan Blue Rash  . Other Itching and Rash    CHG soap    Current Outpatient Medications  Medication Sig Dispense Refill  . acetaminophen (TYLENOL) 500 MG tablet Take 500-1,000 mg by mouth 2 (two) times daily as needed for moderate pain.    . carvedilol (COREG) 6.25 MG tablet TAKE 1 TABLET (6.25 MG TOTAL) BY MOUTH 2 (TWO) TIMES DAILY WITH A MEAL. 180 tablet 3  . celecoxib (CELEBREX) 200 MG capsule Take 200 mg by mouth as needed.     . cetirizine (ZYRTEC) 10 MG tablet Take 10 mg by mouth daily.    . famotidine (PEPCID) 20 MG tablet Take 20 mg by mouth at bedtime.    . fluticasone (FLONASE) 50 MCG/ACT nasal spray Place 1 spray into both nostrils daily as needed for allergies or rhinitis.    Marland Kitchen glipiZIDE (GLUCOTROL XL) 10 MG 24 hr tablet Take 10 mg by mouth daily with breakfast.     . glucose blood (ONETOUCH ULTRA) test strip TEST BLOOD SUGAR ONCE A DAY    . Menthol-Methyl Salicylate (MUSCLE RUB EX) Apply 1 application topically as needed (for pain).    . metFORMIN (GLUCOPHAGE) 850 MG tablet Take 850 mg by mouth 2 (two) times daily with a meal.     . prasugrel (EFFIENT) 10 MG TABS tablet TAKE 1 TABLET (10 MG TOTAL) BY MOUTH DAILY. 90 tablet 1  . REPATHA SURECLICK 981 MG/ML SOAJ INJECT 140MG  INTO THE SKIN EVERY 14 DAYS 6 mL 3  . sertraline (ZOLOFT) 50 MG tablet Take 25 mg by mouth daily.     Marland Kitchen spironolactone (ALDACTONE) 25 MG tablet TAKE 1 TABLET (25 MG TOTAL) BY MOUTH DAILY. 45 tablet 3  . TRULICITY 1.91 YN/8.2NF SOPN Inject 0.75 mg into the skin once a week.   3  . losartan (COZAAR) 25 MG tablet Take 1 tablet (25 mg total) by mouth daily. 90 tablet 3  . TURMERIC CURCUMIN PO Take 1,200 mg by mouth  daily.      No current facility-administered medications for this visit.       Physical Exam: BP (!) 145/84   Pulse 75   Temp 97.6 F (36.4 C) (Skin)   Resp 20   Ht 5\' 2"  (1.575 m)   Wt 202 lb (91.6 kg)   SpO2 95% Comment: RA  BMI 36.95 kg/m  General appearance: alert, cooperative, appears stated age and no distress Head: Normocephalic, without obvious abnormality, atraumatic Neck: no adenopathy, no  carotid bruit, no JVD, supple, symmetrical, trachea midline and thyroid not enlarged, symmetric, no tenderness/mass/nodules Resp: clear to auscultation bilaterally Cardio: regular rate and rhythm, S1, S2 normal, no murmur, click, rub or gallop GI: soft, non-tender; bowel sounds normal; no masses,  no organomegaly Extremities: extremities normal, atraumatic, no cyanosis or edema and Homans sign is negative, no sign of DVT Neurologic: Grossly normal Patient has no murmur of aortic insufficiency Sternal incision is well-healed  Diagnostic Studies & Laboratory data:         Recent Radiology Findings:  CLINICAL DATA:  Primary Cancer Type: Lung  Imaging Indication: Routine surveillance  Interval therapy since last imaging? No  Initial Cancer Diagnosis  Date: 05/17/2018    established by: Biopsy-proven  Detailed Pathology: Stage IA non-small cell lung cancer, adenocarcinoma, 1.2 cm.  Primary Tumor location: Left upper lobe  Surgeries: Left upper lobe wedge resection 05/24/2018.  Chemotherapy: No  Immunotherapy? No  Radiation therapy? No  EXAM: CT CHEST WITH CONTRAST  TECHNIQUE: Multidetector CT imaging of the chest was performed during intravenous contrast administration.  CONTRAST:  13mL OMNIPAQUE IOHEXOL 300 MG/ML  SOLN  COMPARISON:  Most recent CT chest 07/13/2019.  06/29/2018 PET-CT.  FINDINGS: Cardiovascular: Normal heart size. No significant pericardial effusion/thickening. Three-vessel coronary atherosclerosis status post CABG. Aortic  valve prosthesis in place. Atherosclerotic nonaneurysmal thoracic aorta. Normal caliber pulmonary arteries. No central pulmonary emboli.  Mediastinum/Nodes: No discrete thyroid nodules. Unremarkable esophagus. No pathologically enlarged axillary, mediastinal or hilar lymph nodes.  Lungs/Pleura: No pneumothorax. No pleural effusion. Stable curvilinear parenchymal band in the left upper lobe along the wedge resection suture site. No acute consolidative airspace disease or lung masses. Solid 3 mm anterior right middle lobe pulmonary nodule (series 7/image 88) and scattered small posterior right upper lobe ground-glass pulmonary nodules up to 4 mm (series 7/image 75) are all stable. No new significant pulmonary nodules.  Upper abdomen: No acute abnormality.  Musculoskeletal: No aggressive appearing focal osseous lesions. Intact sternotomy wires. Moderate thoracic spondylosis.  IMPRESSION: 1. No evidence of local tumor recurrence in the left upper lobe status post wedge resection. 2. No evidence of metastatic disease in the chest. 3. Tiny solid and ground-glass right pulmonary nodules are all stable. 4. Aortic Atherosclerosis (ICD10-I70.0).   Electronically Signed   By: Ilona Sorrel M.D.   On: 01/11/2020 13:14  I have independently reviewed the above radiology studies  and reviewed the findings with the patient.     CLINICAL DATA:  Lung cancer staging, status post wedge resection left upper lobe  EXAM: CT CHEST WITH CONTRAST  TECHNIQUE: Multidetector CT imaging of the chest was performed during intravenous contrast administration.  CONTRAST:  22mL OMNIPAQUE IOHEXOL 300 MG/ML  SOLN  COMPARISON:  01/09/2019  FINDINGS: Cardiovascular: Aortic atherosclerosis. Normal heart size. Status post aortic valve replacement. Extensive 3 vessel coronary artery calcifications and/or stents. No pericardial effusion.  Mediastinum/Nodes: No enlarged mediastinal, hilar,  or axillary lymph nodes. Thyroid gland, trachea, and esophagus demonstrate no significant findings.  Lungs/Pleura: Stable postoperative findings of left upper lobe wedge resection. Unchanged, scattered small mostly ground-glass nodules, primarily of the dependent right lung (series 7, image 33, 56). Dependent scarring of the right lower lobe. No pleural effusion or pneumothorax.  Upper Abdomen: No acute abnormality.  Hepatic steatosis.  Musculoskeletal: No chest wall mass or suspicious bone lesions identified.  IMPRESSION: 1. Stable postoperative findings of left upper lobe wedge resection. No evidence of malignant recurrence or metastatic disease in the chest.  2. Unchanged, scattered small  mostly ground-glass nodules, primarily of the dependent right lung (series 7, image 33, 56), appearance and distribution likely infectious or inflammatory. Attention on follow-up.  3.  Coronary artery disease.  Aortic Atherosclerosis (ICD10-I70.0).  4.  Hepatic steatosis.   Electronically Signed   By: Eddie Candle M.D.   On: 07/13/2019 14:09  CLINICAL DATA:  Non-small-cell lung cancer of the left upper lobe. Status post wedge resection.  EXAM: CT CHEST WITH CONTRAST  TECHNIQUE: Multidetector CT imaging of the chest was performed during intravenous contrast administration.  CONTRAST:  78mL OMNIPAQUE IOHEXOL 300 MG/ML  SOLN  COMPARISON:  PET-CT 06/29/2018.  FINDINGS: Cardiovascular: The heart size is normal. No substantial pericardial effusion. Status post AVR. Coronary artery calcification is evident. Atherosclerotic calcification is noted in the wall of the thoracic aorta.  Mediastinum/Nodes: No mediastinal lymphadenopathy. There is no hilar lymphadenopathy. The esophagus has normal imaging features. There is no axillary lymphadenopathy.  Lungs/Pleura: The central tracheobronchial airways are patent. 4 mm right upper lobe subpleural nodule is unchanged  in the interval. Stable 5 mm ground-glass nodule in the lateral right lung (67/7). Architectural distortion and scarring noted left upper lobe with associated staple line. Soft tissue attenuation associated with the staple line has decreased in the interval. No new suspicious pulmonary nodule or mass on today's study. No pleural effusion. Scarring noted medial right lower lobe.  Upper Abdomen: Small focus of subcapsular enhancement in the posterior right liver (142/2) is stable since 04/15/2018. This is probably a vascular malformation. Cortical scarring noted in the incompletely visualized kidneys.  Musculoskeletal: No worrisome lytic or sclerotic osseous abnormality.  IMPRESSION: 1. Interval decrease in the volume of soft tissue associated with the left upper lobe surgical site. 2. Stable tiny right lung nodules. Continued attention on follow-up recommended. 3. No change small focus of subcapsular hyper enhancement in the posterior right liver comparing back to 04/15/2018. Probably vascular malformation. This could also be reassessed at the time of follow-up imaging. 4.  Aortic Atherosclerois (ICD10-170.0)   Electronically Signed   By: Misty Stanley M.D.   On: 01/10/2019 08:53     CLINICAL DATA:  Initial treatment strategy for pulmonary nodule.  EXAM: NUCLEAR MEDICINE PET SKULL BASE TO THIGH  TECHNIQUE: 9.6 mCi F-18 FDG was injected intravenously. Full-ring PET imaging was performed from the skull base to thigh after the radiotracer. CT data was obtained and used for attenuation correction and anatomic localization.  Fasting blood glucose: 128 mg/dl  COMPARISON:  Chest CT 04/15/2018  FINDINGS: Mediastinal blood pool activity: SUV max 2.9  NECK: No hypermetabolic lymph nodes in the neck.  Incidental CT findings: none  CHEST: Interval midline sternotomy and CABG. Moderate metabolic activity along the sternum related to postsurgical  inflammation.  Interval LEFT upper lobe wedge resection. There is staple line with parenchymal thickening in the LEFT upper lobe. There is mild metabolic activity along this surgical margin with SUV max equal 3.9 which is felt benign postsurgical change. No discrete hypermetabolic nodule.  No hypermetabolic mediastinal lymph nodes. There is a focus of atelectasis along the left cardiac border with SUV max equal 4.0 (image 81) which is also favored benign.  Incidental CT findings: none  ABDOMEN/PELVIS: No abnormal hypermetabolic activity within the liver, pancreas, adrenal glands, or spleen. No hypermetabolic lymph nodes in the abdomen or pelvis.  Intense physiologic activity in the bowel.  Incidental CT findings: Uterus and ovaries normal  SKELETON: No focal hypermetabolic activity to suggest skeletal metastasis.  Incidental CT findings: none  IMPRESSION:  1. Postsurgical change in the LEFT upper lobe without clear evidence of carcinoma. Recommend follow-up CT surveillance. 2. No evidence of metastatic disease. 3. Interval sternotomy and CABG   Electronically Signed   By: Suzy Bouchard M.D.   On: 06/30/2018 08:20   Recent Labs: Lab Results  Component Value Date   WBC 8.5 01/11/2020   HGB 13.4 01/11/2020   HCT 41.6 01/11/2020   PLT 214 01/11/2020   GLUCOSE 109 (H) 01/11/2020   CHOL 95 (L) 06/05/2019   TRIG 151 (H) 06/05/2019   HDL 27 (L) 06/05/2019   LDLCALC 42 06/05/2019   ALT 21 01/11/2020   AST 15 01/11/2020   NA 140 01/11/2020   K 5.3 (H) 01/11/2020   CL 106 01/11/2020   CREATININE 0.93 01/11/2020   BUN 15 01/11/2020   CO2 24 01/11/2020   INR 1.53 05/17/2018   HGBA1C 9.3 (H) 05/12/2018      Assessment / Plan:  #1 status post coronary artery bypass grafting and aortic valve replacement with tissue valve-her functional status is significant proved.  Patient was again reminded about antibiotic dental prophylaxis and the need for good  dental care.   #2 non-small cell carcinoma of the lung well differentiated 1.2 cm left upper lobe -without evidence of recurrence on CT scan May 2021 -follow-up scan is ordered by oncology for October of this year  We will plan to see the patient back February 2022   Grace Isaac 05/23/2020 10:42 AM

## 2020-05-23 NOTE — Patient Instructions (Signed)
Endocarditis Information  You may be at risk for developing endocarditis since you have  an artificial heart valve  or a repaired heart valve. Endocarditis is an infection of the lining of the heart or heart valves.   Certain surgical and dental procedures may put you at risk,  such as teeth cleaning or other dental procedures or any surgery involving the respiratory, urinary, gastrointestinal tract, gallbladder or prostate.   Notify your doctor or dentist before having any invasive procedures. You will need to take antibiotics before certain procedures.   To prevent endocarditis, maintain good oral health. Seek prompt medical attention for any mouth/gum, skin or urinary tract infections.   Endocarditis Information  You may be at risk for developing endocarditis since you have  an artificial heart valve  or a repaired heart valve. Endocarditis is an infection of the lining of the heart or heart valves.   Certain surgical and dental procedures may put you at risk,  such as teeth cleaning or other dental procedures or any surgery involving the respiratory, urinary, gastrointestinal tract, gallbladder or prostate.   Notify your doctor or dentist before having any invasive procedures. You will need to take antibiotics before certain procedures.   To prevent endocarditis, maintain good oral health. Seek prompt medical attention for any mouth/gum, skin or urinary tract infections.

## 2020-05-24 ENCOUNTER — Encounter: Payer: Self-pay | Admitting: Cardiothoracic Surgery

## 2020-05-29 NOTE — Progress Notes (Signed)
Cardiology Office Note:    Date:  05/31/2020   ID:  Sarah Stevenson, DOB Jun 18, 1956, MRN [id number]  PCP:  Christain Sacramento, MD  Cardiologist:  Sinclair Grooms, MD   Referring MD: Christain Sacramento, MD   Chief Complaint  Patient presents with  . Coronary Artery Disease  . Congestive Heart Failure  . Cardiac Valve Problem    History of Present Illness:    Sarah Stevenson is a 64 y.o. female with a hx of CAD with prior RCA DES and severe diagonal #2 disease, andaortic stenosis treated with CABG and AVR (2019), lung cancer diagnosed during CABG, DM II, hyperlipidemia, and depression.  She is doing well.  She is retired.  She is taking better care of herself.  She gets over 7000 steps each day.  She is not having angina.  She denies shortness of breath and symptoms suggestive of angina.  She is switching dentists and contemplating having a colonoscopy by Dr. Juanita Stevenson.  We discussed endocarditis prophylaxis.  Past Medical History:  Diagnosis Date  . Anginal pain ([place])   . Aortic stenosis   . Coronary artery disease   . Depression    "mild" (08/06/2016)  . Dyspnea   . Hyperlipidemia   . lung ca dx'd 05/2018  . Murmur, cardiac   . Myocardial infarction ([place]) 2016  . Osteoarthritis    "left hip and knee" (08/06/2016)  . Type II diabetes mellitus ([place])     Past Surgical History:  Procedure Laterality Date  . AORTIC VALVE REPLACEMENT N/A 05/17/2018   Procedure: AORTIC VALVE REPLACEMENT (AVR) USING 21 MM MAGNA EASE PERICARDIAL BIOPROSTHESIS. MODEL # 3300TFX. SERIAL # [id number];  Surgeon: Grace Isaac, MD;  Location: [place];  Service: Open Heart Surgery;  Laterality: N/A;  . CARDIAC CATHETERIZATION Right 11/30/2014   Procedure: RIGHT/LEFT HEART CATH AND CORONARY ANGIOGRAPHY;  Surgeon: Belva Crome, MD;  Location: [facility] CATH LAB;  Service: Cardiovascular;  Laterality: Right;  . CARDIAC CATHETERIZATION N/A 12/13/2015   Procedure: Left Heart Cath and Coronary Angiography;  Surgeon:  Leonie Man, MD;  Location: [place] CV LAB;  Service: Cardiovascular;  Laterality: N/A;  . CARDIAC CATHETERIZATION N/A 12/13/2015   Procedure: Intravascular Pressure Wire/FFR Study;  Surgeon: Leonie Man, MD;  Location: [place] CV LAB;  Service: Cardiovascular;  Laterality: N/A;  RCA  . CARDIAC CATHETERIZATION N/A 08/06/2016   Procedure: Right/Left Heart Cath and Coronary Angiography;  Surgeon: Lorretta Harp, MD;  Location: [place] CV LAB;  Service: Cardiovascular;  Laterality: N/A;  . CARDIAC CATHETERIZATION N/A 08/06/2016   Procedure: Coronary Stent Intervention;  Surgeon: Lorretta Harp, MD;  Location: [place] CV LAB;  Service: Cardiovascular;  Laterality: N/A;   distal rca 3.0x16 and 3.0x8 synergy  . CESAREAN SECTION  1989  . CORONARY ANGIOPLASTY WITH STENT PLACEMENT  08/06/2016   "2 stents"  . CORONARY ARTERY BYPASS GRAFT N/A 05/17/2018   Procedure: CORONARY ARTERY BYPASS GRAFTING (CABG) X 2 WITH ENDOSCOPIC HARVESTING OF RIGHT SAPHENOUS VEIN. LIMA TO LAD. SVG TO RCA;  Surgeon: Grace Isaac, MD;  Location: [place];  Service: Open Heart Surgery;  Laterality: N/A;  . HYSTEROSCOPY DIAGNOSTIC  1990s  . JOINT REPLACEMENT    . KNEE ARTHROSCOPY Left 1996  . NASAL SINUS SURGERY  1977  . RIGHT/LEFT HEART CATH AND CORONARY ANGIOGRAPHY N/A 04/01/2018   Procedure: RIGHT/LEFT HEART CATH AND CORONARY ANGIOGRAPHY;  Surgeon: Belva Crome, MD;  Location: [place]  CV LAB;  Service: Cardiovascular;  Laterality: N/A;  . TEE WITHOUT CARDIOVERSION N/A 05/17/2018   Procedure: TRANSESOPHAGEAL ECHOCARDIOGRAM (TEE);  Surgeon: Grace Isaac, MD;  Location: [place];  Service: Open Heart Surgery;  Laterality: N/A;  . TOTAL HIP ARTHROPLASTY Left 04/23/2017   Procedure: LEFT TOTAL HIP ARTHROPLASTY ANTERIOR APPROACH;  Surgeon: Mcarthur Rossetti, MD;  Location: [facility];  Service: Orthopedics;  Laterality: Left;  . WEDGE RESECTION Left 05/17/2018   Procedure: LEFT UPPER LOBE LUNG  WEDGE RESECTION;  Surgeon: Grace Isaac, MD;  Location: [place];  Service: Open Heart Surgery;  Laterality: Left;    Current Medications: Current Meds  Medication Sig  . acetaminophen (TYLENOL) 500 MG tablet Take 500-1,000 mg by mouth 2 (two) times daily as needed for moderate pain.  . carvedilol (COREG) 6.25 MG tablet TAKE 1 TABLET (6.25 MG TOTAL) BY MOUTH 2 (TWO) TIMES DAILY WITH A MEAL.  . celecoxib (CELEBREX) 200 MG capsule Take 200 mg by mouth as needed.   . cetirizine (ZYRTEC) 10 MG tablet Take 10 mg by mouth daily.  . famotidine (PEPCID) 20 MG tablet Take 20 mg by mouth at bedtime.  . fluticasone (FLONASE) 50 MCG/ACT nasal spray Place 1 spray into both nostrils daily as needed for allergies or rhinitis.  Marland Kitchen glipiZIDE (GLUCOTROL XL) 10 MG 24 hr tablet Take 10 mg by mouth daily with breakfast.   . glucose blood (ONETOUCH ULTRA) test strip TEST BLOOD SUGAR ONCE A DAY  . losartan (COZAAR) 25 MG tablet Take 1 tablet (25 mg total) by mouth daily.  . Menthol-Methyl Salicylate (MUSCLE RUB EX) Apply 1 application topically as needed (for pain).  . metFORMIN (GLUCOPHAGE) 850 MG tablet Take 850 mg by mouth 2 (two) times daily with a meal.   . prasugrel (EFFIENT) 10 MG TABS tablet TAKE 1 TABLET (10 MG TOTAL) BY MOUTH DAILY.  Marland Kitchen REPATHA SURECLICK 425 MG/ML SOAJ INJECT 140MG  INTO THE SKIN EVERY 14 DAYS  . sertraline (ZOLOFT) 50 MG tablet Take 25 mg by mouth daily.   Marland Kitchen spironolactone (ALDACTONE) 25 MG tablet TAKE 1 TABLET (25 MG TOTAL) BY MOUTH DAILY.  . TRULICITY 9.56 LO/7.5IE SOPN Inject 0.75 mg into the skin once a week.      Allergies:   Brilinta [ticagrelor], Lisinopril, Asa [aspirin], Blue dyes (parenteral), Corn-containing products, Nsaids, Sugar-protein-starch, Tea, Rosuvastatin, Zetia [ezetimibe], Isosulfan blue, and Other   Social History   Socioeconomic History  . Marital status: Divorced    Spouse name: Not on file  . Number of children: Not on file  . Years of education: Not on  file  . Highest education level: Bachelor's degree (e.g., BA, AB, BS)  Occupational History  . Not on file  Tobacco Use  . Smoking status: Former Smoker    Packs/day: 1.00    Years: 10.00    Pack years: 10.00    Types: Cigarettes    Quit date: 1982    Years since quitting: 39.7  . Smokeless tobacco: Never Used  . Tobacco comment: restarted  about 5-6 yrs ago, then stopped  Vaping Use  . Vaping Use: Former  Substance and Sexual Activity  . Alcohol use: Yes    Alcohol/week: 7.0 standard drinks    Types: 7 Shots of liquor per week    Comment: weekly  . Drug use: No  . Sexual activity: Never  Other Topics Concern  . Not on file  Social History Narrative  . Not on file   Social Determinants  of Health   Financial Resource Strain:   . Difficulty of Paying Living Expenses: Not on file  Food Insecurity:   . Worried About Charity fundraiser in the Last Year: Not on file  . Ran Out of Food in the Last Year: Not on file  Transportation Needs:   . Lack of Transportation (Medical): Not on file  . Lack of Transportation (Non-Medical): Not on file  Physical Activity:   . Days of Exercise per Week: Not on file  . Minutes of Exercise per Session: Not on file  Stress:   . Feeling of Stress : Not on file  Social Connections:   . Frequency of Communication with Friends and Family: Not on file  . Frequency of Social Gatherings with Friends and Family: Not on file  . Attends Religious Services: Not on file  . Active Member of Clubs or Organizations: Not on file  . Attends Archivist Meetings: Not on file  . Marital Status: Not on file     Family History: The patient's family history includes Heart attack in her father and mother; Sudden death in her father. There is no history of Hypertension, Hyperlipidemia, or Diabetes.  ROS:   Please see the history of present illness.    Denies fever, chills, orthopnea, PND, palpitations, or syncope.  All other systems reviewed and  are negative.  EKGs/Labs/Other Studies Reviewed:    The following studies were reviewed today: No new data  EKG:  EKG no change compared to August 07, 2019.  Today's tracing demonstrates normal sinus rhythm with left atrial abnormality, right axis deviation, small inferior Q waves, poor R wave progression.  Recent Labs: 01/11/2020: ALT 21; BUN 15; Creatinine 0.93; Hemoglobin 13.4; Platelet Count 214; Potassium 5.3; Sodium 140  Recent Lipid Panel    Component Value Date/Time   CHOL 95 (L) 06/05/2019 1212   TRIG 151 (H) 06/05/2019 1212   HDL 27 (L) 06/05/2019 1212   CHOLHDL 3.5 06/05/2019 1212   CHOLHDL 4.8 12/01/2014 0425   VLDL 24 12/01/2014 0425   LDLCALC 42 06/05/2019 1212    Physical Exam:    VS:  BP 134/76   Pulse 82   Ht 5\' 2"  (1.575 m)   Wt 204 lb (92.5 kg)   SpO2 98%   BMI 37.31 kg/m     Wt Readings from Last 3 Encounters:  05/31/20 204 lb (92.5 kg)  05/23/20 202 lb (91.6 kg)  01/15/20 205 lb 11.2 oz (93.3 kg)     GEN: Mild obesity. No acute distress HEENT: Normal NECK: No JVD. LYMPHATICS: No lymphadenopathy CARDIAC: 2/6 right upper sternal diamond-shaped crescendo decrescendo systolic murmur.  RRR without diastolic murmur, gallop, or edema. VASCULAR:  Normal Pulses. No bruits. RESPIRATORY:  Clear to auscultation without rales, wheezing or rhonchi  ABDOMEN: Soft, non-tender, non-distended, No pulsatile mass, MUSCULOSKELETAL: No deformity  SKIN: Warm and dry NEUROLOGIC:  Alert and oriented x 3 PSYCHIATRIC:  Normal affect   ASSESSMENT:    1. Coronary artery disease involving coronary bypass graft of native heart with angina pectoris ([place])   2. H/O aortic valve replacement   3. Chronic diastolic heart failure ([place])   4. Mixed hyperlipidemia   5. Type 2 diabetes mellitus with complication (HCC)   6. Educated about COVID-19 virus infection    PLAN:    In order of problems listed above:  1. Stable without angina.  Secondary prevention  discussed. 2. Normally functioning aortic valve.  She should have endocarditis prophylaxis  with dental and not not lower GI procedures unless working in an infected field.  For dental procedures, amoxicillin 1 g orally 30 to 60 minutes prior to the procedure. 3. Evidence of volume overload.  Continue low-dose Aldactone but decreased to 12.5 mg daily.  Continue carvedilol 6.25 mg twice daily.  The Aldactone dose is being reduced.  If blood pressure rises we will need to use a higher dose carvedilol perhaps 12.5 mg twice daily. 4. Continue Repatha twice per month 140 mg/mL.  Last LDL was less than 60. 5. Most recent hemoglobin A1c was 9.3 last September.  Given her cardiac condition she should be considered for SGLT2 therapy. 6. She is vaccinated and practicing medication.  Overall education and awareness concerning primary/secondary risk prevention was discussed in detail: LDL less than 70, hemoglobin A1c less than 7, blood pressure target less than 130/80 mmHg, >150 minutes of moderate aerobic activity per week, avoidance of smoking, weight control (via diet and exercise), and continued surveillance/management of/for obstructive sleep apnea.    Medication Adjustments/Labs and Tests Ordered: Current medicines are reviewed at length with the patient today.  Concerns regarding medicines are outlined above.  Orders Placed This Encounter  Procedures  . EKG 12-Lead   No orders of the defined types were placed in this encounter.   There are no Patient Instructions on file for this visit.   Signed, Sinclair Grooms, MD  05/31/2020 9:11 AM    [place]

## 2020-05-31 ENCOUNTER — Ambulatory Visit (INDEPENDENT_AMBULATORY_CARE_PROVIDER_SITE_OTHER): Payer: 59 | Admitting: Interventional Cardiology

## 2020-05-31 ENCOUNTER — Other Ambulatory Visit: Payer: Self-pay

## 2020-05-31 ENCOUNTER — Encounter: Payer: Self-pay | Admitting: *Deleted

## 2020-05-31 ENCOUNTER — Encounter: Payer: Self-pay | Admitting: Interventional Cardiology

## 2020-05-31 VITALS — BP 134/76 | HR 82 | Ht 62.0 in | Wt 204.0 lb

## 2020-05-31 DIAGNOSIS — Z7189 Other specified counseling: Secondary | ICD-10-CM

## 2020-05-31 DIAGNOSIS — E118 Type 2 diabetes mellitus with unspecified complications: Secondary | ICD-10-CM

## 2020-05-31 DIAGNOSIS — Z952 Presence of prosthetic heart valve: Secondary | ICD-10-CM

## 2020-05-31 DIAGNOSIS — E782 Mixed hyperlipidemia: Secondary | ICD-10-CM | POA: Diagnosis not present

## 2020-05-31 DIAGNOSIS — I25709 Atherosclerosis of coronary artery bypass graft(s), unspecified, with unspecified angina pectoris: Secondary | ICD-10-CM | POA: Diagnosis not present

## 2020-05-31 DIAGNOSIS — I5032 Chronic diastolic (congestive) heart failure: Secondary | ICD-10-CM

## 2020-05-31 MED ORDER — SPIRONOLACTONE 25 MG PO TABS: 12.5000 mg | ORAL_TABLET | Freq: Every day | ORAL | 3 refills | Status: DC

## 2020-05-31 NOTE — Patient Instructions (Signed)
Medication Instructions:  1) DECREASE Spironolactone to 12.5mg  once daily  *If you need a refill on your cardiac medications before your next appointment, please call your pharmacy*   Lab Work: None If you have labs (blood work) drawn today and your tests are completely normal, you will receive your results only by: Marland Kitchen MyChart Message (if you have MyChart) OR . A paper copy in the mail If you have any lab test that is abnormal or we need to change your treatment, we will call you to review the results.   Testing/Procedures: None   Follow-Up: At [facility], you and your health needs are our priority.  As part of our continuing mission to provide you with exceptional heart care, we have created designated Provider Care Teams.  These Care Teams include your primary Cardiologist (physician) and Advanced Practice Providers (APPs -  Physician Assistants and Nurse Practitioners) who all work together to provide you with the care you need, when you need it.  We recommend signing up for the patient portal called "MyChart".  Sign up information is provided on this After Visit Summary.  MyChart is used to connect with patients for Virtual Visits (Telemedicine).  Patients are able to view lab/test results, encounter notes, upcoming appointments, etc.  Non-urgent messages can be sent to your provider as well.   To learn more about what you can do with MyChart, go to [email or web address].    Your next appointment:   12 month(s)  The format for your next appointment:   In Person  Provider:   You may see Sinclair Grooms, MD or one of the following Advanced Practice Providers on your designated Care Team:    Truitt Merle, NP  Cecilie Kicks, NP  Kathyrn Drown, NP    Other Instructions

## 2020-06-12 ENCOUNTER — Encounter: Payer: Self-pay | Admitting: *Deleted

## 2020-06-13 ENCOUNTER — Other Ambulatory Visit (HOSPITAL_BASED_OUTPATIENT_CLINIC_OR_DEPARTMENT_OTHER): Payer: Self-pay | Admitting: Gastroenterology

## 2020-06-13 MED FILL — CLENPIQ 10-3.5-12 MG-GM -GM: 10-3.5-12 M | 1 days supply | Qty: 320 | Fill #0

## 2020-06-17 DIAGNOSIS — Z1211 Encounter for screening for malignant neoplasm of colon: Secondary | ICD-10-CM | POA: Diagnosis not present

## 2020-06-17 DIAGNOSIS — D128 Benign neoplasm of rectum: Secondary | ICD-10-CM | POA: Diagnosis not present

## 2020-06-17 DIAGNOSIS — K635 Polyp of colon: Secondary | ICD-10-CM | POA: Diagnosis not present

## 2020-06-17 DIAGNOSIS — K621 Rectal polyp: Secondary | ICD-10-CM | POA: Diagnosis not present

## 2020-06-20 DIAGNOSIS — J019 Acute sinusitis, unspecified: Secondary | ICD-10-CM | POA: Diagnosis not present

## 2020-06-20 DIAGNOSIS — Z20828 Contact with and (suspected) exposure to other viral communicable diseases: Secondary | ICD-10-CM | POA: Diagnosis not present

## 2020-07-11 ENCOUNTER — Other Ambulatory Visit: Payer: Self-pay | Admitting: Interventional Cardiology

## 2020-07-11 MED FILL — METFORMIN HCL 850 MG TABS: 850 | 90 days supply | Qty: 180 | Fill #3

## 2020-07-11 MED FILL — CARVEDILOL 6.25 MG TABLET: 6.25 | 90 days supply | Qty: 180 | Fill #3

## 2020-07-11 MED FILL — LOSARTAN POTASSIUM 50 MG TA: 50 | 90 days supply | Qty: 90 | Fill #0

## 2020-07-11 MED FILL — PRASUGREL HCL 10 MG TABS: 10 | 90 days supply | Qty: 90 | Fill #0

## 2020-07-11 MED FILL — SERTRALINE HCL 50 MG TABLET: 50 | 90 days supply | Qty: 90 | Fill #1

## 2020-07-11 MED FILL — REPATHA SURECLICK 140 MG/ML: 140 | 84 days supply | Qty: 6 | Fill #2

## 2020-07-11 MED FILL — SPIRONOLACTONE 25 MG TABS: 25 | 45 days supply | Qty: 45 | Fill #2

## 2020-07-12 ENCOUNTER — Other Ambulatory Visit (HOSPITAL_BASED_OUTPATIENT_CLINIC_OR_DEPARTMENT_OTHER): Payer: Self-pay

## 2020-07-12 MED FILL — AMOXICILLIN 500 MG CAPSULE: 500 | 4 days supply | Qty: 16 | Fill #0

## 2020-07-12 MED FILL — TRULICITY 0.75 MG/0.5 ML PE: 0.75 | 84 days supply | Qty: 6 | Fill #0

## 2020-07-15 ENCOUNTER — Other Ambulatory Visit: Payer: Self-pay

## 2020-07-15 ENCOUNTER — Ambulatory Visit (HOSPITAL_COMMUNITY)
Admission: RE | Admit: 2020-07-15 | Discharge: 2020-07-15 | Disposition: A | Discharge status: Home / Self Care | Payer: 59 | Source: Ambulatory Visit | Attending: Internal Medicine | Admitting: Internal Medicine

## 2020-07-15 ENCOUNTER — Inpatient Hospital Stay: Payer: 59 | Attending: Internal Medicine

## 2020-07-15 DIAGNOSIS — Z902 Acquired absence of lung [part of]: Secondary | ICD-10-CM | POA: Insufficient documentation

## 2020-07-15 DIAGNOSIS — E785 Hyperlipidemia, unspecified: Secondary | ICD-10-CM | POA: Insufficient documentation

## 2020-07-15 DIAGNOSIS — C3490 Malignant neoplasm of unspecified part of unspecified bronchus or lung: Secondary | ICD-10-CM

## 2020-07-15 DIAGNOSIS — Z7984 Long term (current) use of oral hypoglycemic drugs: Secondary | ICD-10-CM | POA: Diagnosis not present

## 2020-07-15 DIAGNOSIS — R918 Other nonspecific abnormal finding of lung field: Secondary | ICD-10-CM | POA: Diagnosis not present

## 2020-07-15 DIAGNOSIS — E119 Type 2 diabetes mellitus without complications: Secondary | ICD-10-CM | POA: Insufficient documentation

## 2020-07-15 DIAGNOSIS — F329 Major depressive disorder, single episode, unspecified: Secondary | ICD-10-CM | POA: Diagnosis not present

## 2020-07-15 DIAGNOSIS — I252 Old myocardial infarction: Secondary | ICD-10-CM | POA: Insufficient documentation

## 2020-07-15 DIAGNOSIS — Z85118 Personal history of other malignant neoplasm of bronchus and lung: Secondary | ICD-10-CM | POA: Insufficient documentation

## 2020-07-15 DIAGNOSIS — Z951 Presence of aortocoronary bypass graft: Secondary | ICD-10-CM | POA: Diagnosis not present

## 2020-07-15 DIAGNOSIS — Z79899 Other long term (current) drug therapy: Secondary | ICD-10-CM | POA: Insufficient documentation

## 2020-07-15 DIAGNOSIS — I2510 Atherosclerotic heart disease of native coronary artery without angina pectoris: Secondary | ICD-10-CM | POA: Insufficient documentation

## 2020-07-15 DIAGNOSIS — R911 Solitary pulmonary nodule: Secondary | ICD-10-CM | POA: Diagnosis not present

## 2020-07-15 LAB — CBC WITH DIFFERENTIAL (CANCER CENTER ONLY)
Abs Immature Granulocytes: 0.02 10*3/uL (ref 0.00–0.07)
Basophils Absolute: 0.1 10*3/uL (ref 0.0–0.1)
Basophils Relative: 1 %
Eosinophils Absolute: 0.3 10*3/uL (ref 0.0–0.5)
Eosinophils Relative: 3 %
HCT: 39.6 % (ref 36.0–46.0)
Hemoglobin: 13.2 g/dL (ref 12.0–15.0)
Immature Granulocytes: 0 %
Lymphocytes Relative: 27 %
Lymphs Abs: 2.6 10*3/uL (ref 0.7–4.0)
MCH: 30.1 pg (ref 26.0–34.0)
MCHC: 33.3 g/dL (ref 30.0–36.0)
MCV: 90.2 fL (ref 80.0–100.0)
Monocytes Absolute: 0.8 10*3/uL (ref 0.1–1.0)
Monocytes Relative: 9 %
Neutro Abs: 5.9 10*3/uL (ref 1.7–7.7)
Neutrophils Relative %: 60 %
Platelet Count: 224 10*3/uL (ref 150–400)
RBC: 4.39 MIL/uL (ref 3.87–5.11)
RDW: 12.6 % (ref 11.5–15.5)
WBC Count: 9.6 10*3/uL (ref 4.0–10.5)
nRBC: 0 % (ref 0.0–0.2)

## 2020-07-15 LAB — CMP (CANCER CENTER ONLY)
ALT: 14 U/L (ref 0–44)
AST: 12 U/L — ABNORMAL LOW (ref 15–41)
Albumin: 3.5 g/dL (ref 3.5–5.0)
Alkaline Phosphatase: 88 U/L (ref 38–126)
Anion gap: 9 (ref 5–15)
BUN: 15 mg/dL (ref 8–23)
CO2: 22 mmol/L (ref 22–32)
Calcium: 8.8 mg/dL — ABNORMAL LOW (ref 8.9–10.3)
Chloride: 105 mmol/L (ref 98–111)
Creatinine: 1.08 mg/dL — ABNORMAL HIGH (ref 0.44–1.00)
GFR, Estimated: 57 mL/min — ABNORMAL LOW (ref >60)
Glucose, Bld: 201 mg/dL — ABNORMAL HIGH (ref 70–99)
Potassium: 4.5 mmol/L (ref 3.5–5.1)
Sodium: 136 mmol/L (ref 135–145)
Total Bilirubin: 0.5 mg/dL (ref 0.3–1.2)
Total Protein: 6.9 g/dL (ref 6.5–8.1)

## 2020-07-15 MED ORDER — IOHEXOL 300 MG/ML  SOLN
75.0000 mL | Freq: Once | INTRAMUSCULAR | Status: AC | PRN
  Administered 2020-07-15: 75 mL via INTRAVENOUS

## 2020-07-16 ENCOUNTER — Other Ambulatory Visit (HOSPITAL_BASED_OUTPATIENT_CLINIC_OR_DEPARTMENT_OTHER): Payer: Self-pay | Admitting: Family Medicine

## 2020-07-16 DIAGNOSIS — J019 Acute sinusitis, unspecified: Secondary | ICD-10-CM | POA: Diagnosis not present

## 2020-07-16 MED FILL — levoFLOXacin 750 MG TABS: 750 | 7 days supply | Qty: 7 | Fill #0

## 2020-07-16 MED FILL — FLUCONAZOLE 150 MG TABS: 150 | 4 days supply | Qty: 2 | Fill #0

## 2020-07-17 ENCOUNTER — Inpatient Hospital Stay (HOSPITAL_BASED_OUTPATIENT_CLINIC_OR_DEPARTMENT_OTHER): Payer: 59 | Admitting: Internal Medicine

## 2020-07-17 ENCOUNTER — Other Ambulatory Visit: Payer: Self-pay

## 2020-07-17 ENCOUNTER — Telehealth: Payer: Self-pay | Admitting: Internal Medicine

## 2020-07-17 ENCOUNTER — Encounter: Payer: Self-pay | Admitting: Internal Medicine

## 2020-07-17 VITALS — BP 128/62 | HR 72 | Temp 98.5°F | Resp 18 | Ht 62.0 in | Wt 204.8 lb

## 2020-07-17 DIAGNOSIS — C3492 Malignant neoplasm of unspecified part of left bronchus or lung: Secondary | ICD-10-CM | POA: Diagnosis not present

## 2020-07-17 DIAGNOSIS — E119 Type 2 diabetes mellitus without complications: Secondary | ICD-10-CM | POA: Diagnosis not present

## 2020-07-17 DIAGNOSIS — C3490 Malignant neoplasm of unspecified part of unspecified bronchus or lung: Secondary | ICD-10-CM

## 2020-07-17 DIAGNOSIS — Z85118 Personal history of other malignant neoplasm of bronchus and lung: Secondary | ICD-10-CM | POA: Diagnosis not present

## 2020-07-17 DIAGNOSIS — E785 Hyperlipidemia, unspecified: Secondary | ICD-10-CM | POA: Diagnosis not present

## 2020-07-17 DIAGNOSIS — Z79899 Other long term (current) drug therapy: Secondary | ICD-10-CM | POA: Diagnosis not present

## 2020-07-17 DIAGNOSIS — F329 Major depressive disorder, single episode, unspecified: Secondary | ICD-10-CM | POA: Diagnosis not present

## 2020-07-17 DIAGNOSIS — Z7984 Long term (current) use of oral hypoglycemic drugs: Secondary | ICD-10-CM | POA: Diagnosis not present

## 2020-07-17 DIAGNOSIS — Z902 Acquired absence of lung [part of]: Secondary | ICD-10-CM | POA: Diagnosis not present

## 2020-07-17 DIAGNOSIS — I252 Old myocardial infarction: Secondary | ICD-10-CM | POA: Diagnosis not present

## 2020-07-17 DIAGNOSIS — I2510 Atherosclerotic heart disease of native coronary artery without angina pectoris: Secondary | ICD-10-CM | POA: Diagnosis not present

## 2020-07-17 NOTE — Progress Notes (Signed)
[place] [phone]   [phone]  OFFICE PROGRESS NOTE  Christain Sacramento, MD [address] [place] [address]  DIAGNOSIS: Stage IA (T1 a, Nx, Mx) non-small cell lung cancer, adenocarcinoma measuring 1.2 cm.  PRIOR THERAPY: Status post wedge resection of the left upper lobe on May 24, 2018 under the care of Dr. Servando Snare.  CURRENT THERAPY: Observation.  INTERVAL HISTORY: Williamson JON 64 y.o. female returns to the clinic today for 6 months follow-up visit.  The patient is feeling fine today with no concerning complaints except for occasional shortness of breath with exertion.  She is also dealing with some sinus infection recently and treated by her primary care physician with antibiotics.  She denied having any chest pain, cough or hemoptysis.  She denied having any weight loss or night sweats.  She has no nausea, vomiting, diarrhea or constipation.  She denied having any headache or visual changes.  She is here today for evaluation with repeat CT scan of the chest for restaging of her disease.  MEDICAL HISTORY: Past Medical History:  Diagnosis Date  . Anginal pain ([place])   . Aortic stenosis   . Coronary artery disease   . Depression    "mild" (08/06/2016)  . Dyspnea   . Hyperlipidemia   . lung ca dx'd 05/2018  . Murmur, cardiac   . Myocardial infarction ([place]) 2016  . Osteoarthritis    "left hip and knee" (08/06/2016)  . Type II diabetes mellitus ([facility])     ALLERGIES:  is allergic to brilinta [ticagrelor], lisinopril, asa [aspirin], blue dyes (parenteral), corn-containing products, nsaids, sugar-protein-starch, tea, rosuvastatin, zetia [ezetimibe], isosulfan blue, and other.  MEDICATIONS:  Current Outpatient Medications  Medication Sig Dispense Refill  . acetaminophen (TYLENOL) 500 MG tablet Take 500-1,000 mg by mouth 2 (two) times daily as needed for moderate pain.    . carvedilol (COREG) 6.25 MG tablet TAKE 1 TABLET  (6.25 MG TOTAL) BY MOUTH 2 (TWO) TIMES DAILY WITH A MEAL. 180 tablet 3  . celecoxib (CELEBREX) 200 MG capsule Take 200 mg by mouth as needed.     . cetirizine (ZYRTEC) 10 MG tablet Take 10 mg by mouth daily.    . famotidine (PEPCID) 20 MG tablet Take 20 mg by mouth at bedtime.    . fluticasone (FLONASE) 50 MCG/ACT nasal spray Place 1 spray into both nostrils daily as needed for allergies or rhinitis.    Marland Kitchen glipiZIDE (GLUCOTROL XL) 10 MG 24 hr tablet Take 10 mg by mouth daily with breakfast.     . glucose blood (ONETOUCH ULTRA) test strip TEST BLOOD SUGAR ONCE A DAY    . losartan (COZAAR) 25 MG tablet Take 1 tablet (25 mg total) by mouth daily. 90 tablet 3  . Menthol-Methyl Salicylate (MUSCLE RUB EX) Apply 1 application topically as needed (for pain).    . metFORMIN (GLUCOPHAGE) 850 MG tablet Take 850 mg by mouth 2 (two) times daily with a meal.     . prasugrel (EFFIENT) 10 MG TABS tablet TAKE 1 TABLET (10 MG TOTAL) BY MOUTH DAILY. 90 tablet 2  . REPATHA SURECLICK 458 MG/ML SOAJ INJECT 140MG  INTO THE SKIN EVERY 14 DAYS 6 mL 3  . sertraline (ZOLOFT) 50 MG tablet Take 25 mg by mouth daily.     Marland Kitchen spironolactone (ALDACTONE) 25 MG tablet Take 0.5 tablets (12.5 mg total) by mouth daily. 45 tablet 3  . TRULICITY 0.99 IP/3.8SN SOPN Inject 0.75  mg into the skin once a week.   3   No current facility-administered medications for this visit.    SURGICAL HISTORY:  Past Surgical History:  Procedure Laterality Date  . AORTIC VALVE REPLACEMENT N/A 05/17/2018   Procedure: AORTIC VALVE REPLACEMENT (AVR) USING 21 MM MAGNA EASE PERICARDIAL BIOPROSTHESIS. MODEL # 3300TFX. SERIAL # [id number];  Surgeon: Grace Isaac, MD;  Location: [place];  Service: Open Heart Surgery;  Laterality: N/A;  . CARDIAC CATHETERIZATION Right 11/30/2014   Procedure: RIGHT/LEFT HEART CATH AND CORONARY ANGIOGRAPHY;  Surgeon: Belva Crome, MD;  Location: [facility] CATH LAB;  Service: Cardiovascular;  Laterality: Right;  . CARDIAC CATHETERIZATION  N/A 12/13/2015   Procedure: Left Heart Cath and Coronary Angiography;  Surgeon: Leonie Man, MD;  Location: [place] CV LAB;  Service: Cardiovascular;  Laterality: N/A;  . CARDIAC CATHETERIZATION N/A 12/13/2015   Procedure: Intravascular Pressure Wire/FFR Study;  Surgeon: Leonie Man, MD;  Location: [place] CV LAB;  Service: Cardiovascular;  Laterality: N/A;  RCA  . CARDIAC CATHETERIZATION N/A 08/06/2016   Procedure: Right/Left Heart Cath and Coronary Angiography;  Surgeon: Lorretta Harp, MD;  Location: [place] CV LAB;  Service: Cardiovascular;  Laterality: N/A;  . CARDIAC CATHETERIZATION N/A 08/06/2016   Procedure: Coronary Stent Intervention;  Surgeon: Lorretta Harp, MD;  Location: [place] CV LAB;  Service: Cardiovascular;  Laterality: N/A;   distal rca 3.0x16 and 3.0x8 synergy  . CESAREAN SECTION  1989  . CORONARY ANGIOPLASTY WITH STENT PLACEMENT  08/06/2016   "2 stents"  . CORONARY ARTERY BYPASS GRAFT N/A 05/17/2018   Procedure: CORONARY ARTERY BYPASS GRAFTING (CABG) X 2 WITH ENDOSCOPIC HARVESTING OF RIGHT SAPHENOUS VEIN. LIMA TO LAD. SVG TO RCA;  Surgeon: Grace Isaac, MD;  Location: [place];  Service: Open Heart Surgery;  Laterality: N/A;  . HYSTEROSCOPY DIAGNOSTIC  1990s  . JOINT REPLACEMENT    . KNEE ARTHROSCOPY Left 1996  . NASAL SINUS SURGERY  1977  . RIGHT/LEFT HEART CATH AND CORONARY ANGIOGRAPHY N/A 04/01/2018   Procedure: RIGHT/LEFT HEART CATH AND CORONARY ANGIOGRAPHY;  Surgeon: Belva Crome, MD;  Location: [place] CV LAB;  Service: Cardiovascular;  Laterality: N/A;  . TEE WITHOUT CARDIOVERSION N/A 05/17/2018   Procedure: TRANSESOPHAGEAL ECHOCARDIOGRAM (TEE);  Surgeon: Grace Isaac, MD;  Location: [place];  Service: Open Heart Surgery;  Laterality: N/A;  . TOTAL HIP ARTHROPLASTY Left 04/23/2017   Procedure: LEFT TOTAL HIP ARTHROPLASTY ANTERIOR APPROACH;  Surgeon: Mcarthur Rossetti, MD;  Location[facility];  Service: Orthopedics;  Laterality:  Left;  . WEDGE RESECTION Left 05/17/2018   Procedure: LEFT UPPER LOBE LUNG WEDGE RESECTION;  Surgeon: Grace Isaac, MD;  Location: [place];  Service: Open Heart Surgery;  Laterality: Left;    REVIEW OF SYSTEMS:  A comprehensive review of systems was negative except for: Respiratory: positive for dyspnea on exertion   PHYSICAL EXAMINATION: General appearance: alert, cooperative and no distress Head: Normocephalic, without obvious abnormality, atraumatic Neck: no adenopathy, no JVD, supple, symmetrical, trachea midline and thyroid not enlarged, symmetric, no tenderness/mass/nodules Lymph nodes: Cervical, supraclavicular, and axillary nodes normal. Resp: clear to auscultation bilaterally Back: symmetric, no curvature. ROM normal. No CVA tenderness. Cardio: regular rate and rhythm, S1, S2 normal, no murmur, click, rub or gallop GI: soft, non-tender; bowel sounds normal; no masses,  no organomegaly Extremities: extremities normal, atraumatic, no cyanosis or edema  ECOG PERFORMANCE STATUS: 1 - Symptomatic but completely ambulatory  Blood pressure 128/62, pulse 72,  temperature 98.5 F (36.9 C), temperature source Tympanic, resp. rate 18, height 5\' 2"  (1.575 m), weight 204 lb 12.8 oz (92.9 kg), SpO2 98 %.  LABORATORY DATA: Lab Results  Component Value Date   WBC 9.6 07/15/2020   HGB 13.2 07/15/2020   HCT 39.6 07/15/2020   MCV 90.2 07/15/2020   PLT 224 07/15/2020      Chemistry      Component Value Date/Time   NA 136 07/15/2020 0856   NA 136 10/17/2019 0914   K 4.5 07/15/2020 0856   CL 105 07/15/2020 0856   CO2 22 07/15/2020 0856   BUN 15 07/15/2020 0856   BUN 19 10/17/2019 0914   CREATININE 1.08 (H) 07/15/2020 0856      Component Value Date/Time   CALCIUM 8.8 (L) 07/15/2020 0856   ALKPHOS 88 07/15/2020 0856   AST 12 (L) 07/15/2020 0856   ALT 14 07/15/2020 0856   BILITOT 0.5 07/15/2020 0856       RADIOGRAPHIC STUDIES: CT Chest W Contrast  Result Date:  07/15/2020 CLINICAL DATA:  Restaging non-small cell lung cancer. Status post left upper lobe wedge resection. EXAM: CT CHEST WITH CONTRAST TECHNIQUE: Multidetector CT imaging of the chest was performed during intravenous contrast administration. CONTRAST:  58mL OMNIPAQUE IOHEXOL 300 MG/ML  SOLN COMPARISON:  01/11/2020 FINDINGS: Cardiovascular: Previous median sternotomy and CABG procedure. No pericardial effusion. Normal heart size. Mediastinum/Nodes: No enlarged mediastinal, hilar, or axillary lymph nodes. Thyroid gland, trachea, and esophagus demonstrate no significant findings. Lungs/Pleura: Postop change from left upper lobe lung resection. No complicating features identified. Left upper lobe suture line is stable. No suspicious features to suggest local tumor recurrence. Left upper lobe non solid nodule is unchanged measuring 4 mm, image 32/5. Faint non solid nodule within the anterolateral right upper lobe is unchanged measuring 4 mm, image 57/5. Subpleural nodule within the anterior right middle lobe is also unchanged measuring 3 mm. No new suspicious lung nodules identified. Upper Abdomen: No acute abnormality. Musculoskeletal: No chest wall abnormality. No acute or significant osseous findings. Multi level degenerative disc disease identified. No suspicious bone lesions identified. IMPRESSION: 1. Stable CT of the chest. No findings to suggest local tumor recurrence or metastatic disease. 2. Small non-solid nodules within both lungs are unchanged from previous exam. 3. Previous median sternotomy and CABG procedure. Electronically Signed   By: Kerby Moors M.D.   On: 07/15/2020 13:13    ASSESSMENT AND PLAN: This is a very pleasant 64 years old white female diagnosed with a stage Ia non-small cell lung cancer status post wedge resection of the left upper lobe in September 2019 under the care of Dr. Servando Snare. The patient is currently on observation and she is feeling fine with no concerning  complaints. She had repeat CT scan of the chest performed recently.  I personally and independently reviewed the scan and discussed the results with the patient today. Her scan showed no concerning findings for disease recurrence or metastasis. I recommended for the patient to continue on observation with repeat CT scan of the chest in 1 year. She was advised to call immediately if she has any concerning symptoms in the interval. The patient voices understanding of current disease status and treatment options and is in agreement with the current care plan. All questions were answered. The patient knows to call the clinic with any problems, questions or concerns. We can certainly see the patient much sooner if necessary.   Disclaimer: This note was dictated with voice recognition software.  Similar sounding words can inadvertently be transcribed and may not be corrected upon review.

## 2020-07-17 NOTE — Telephone Encounter (Signed)
Scheduled per 11/10 los. Printed avs and calendar for pt.

## 2020-08-15 ENCOUNTER — Other Ambulatory Visit (HOSPITAL_BASED_OUTPATIENT_CLINIC_OR_DEPARTMENT_OTHER): Payer: Self-pay | Admitting: Family Medicine

## 2020-08-15 DIAGNOSIS — J320 Chronic maxillary sinusitis: Secondary | ICD-10-CM | POA: Diagnosis not present

## 2020-08-15 DIAGNOSIS — E119 Type 2 diabetes mellitus without complications: Secondary | ICD-10-CM | POA: Diagnosis not present

## 2020-08-15 DIAGNOSIS — E782 Mixed hyperlipidemia: Secondary | ICD-10-CM | POA: Diagnosis not present

## 2020-08-15 DIAGNOSIS — Z6835 Body mass index (BMI) 35.0-35.9, adult: Secondary | ICD-10-CM | POA: Diagnosis not present

## 2020-08-15 DIAGNOSIS — I10 Essential (primary) hypertension: Secondary | ICD-10-CM | POA: Diagnosis not present

## 2020-08-15 DIAGNOSIS — Z23 Encounter for immunization: Secondary | ICD-10-CM | POA: Diagnosis not present

## 2020-08-15 DIAGNOSIS — E6609 Other obesity due to excess calories: Secondary | ICD-10-CM | POA: Diagnosis not present

## 2020-08-15 MED FILL — glipiZIDE ER 10 MG TB24: 10 | 90 days supply | Qty: 90 | Fill #3

## 2020-08-15 MED FILL — CELECOXIB 200 MG CAPS: 200 | 90 days supply | Qty: 90 | Fill #1

## 2020-08-15 MED FILL — AMOXICILLIN 500 MG CAPSULE: 500 | 10 days supply | Qty: 40 | Fill #0

## 2020-08-17 DIAGNOSIS — Z23 Encounter for immunization: Secondary | ICD-10-CM | POA: Diagnosis not present

## 2020-08-26 DIAGNOSIS — H259 Unspecified age-related cataract: Secondary | ICD-10-CM | POA: Diagnosis not present

## 2020-08-26 DIAGNOSIS — H04123 Dry eye syndrome of bilateral lacrimal glands: Secondary | ICD-10-CM | POA: Diagnosis not present

## 2020-08-27 ENCOUNTER — Other Ambulatory Visit (HOSPITAL_BASED_OUTPATIENT_CLINIC_OR_DEPARTMENT_OTHER): Payer: Self-pay | Admitting: Family Medicine

## 2020-08-27 MED FILL — AMOX-CLAV 875-125 MG TABLET: 875-125 | 10 days supply | Qty: 20 | Fill #0

## 2020-10-03 MED FILL — TRULICITY 0.75 MG/0.5 ML PE: 0.75 | 84 days supply | Qty: 6 | Fill #1

## 2020-10-03 MED FILL — SERTRALINE HCL 50 MG TABLET: 50 | 90 days supply | Qty: 90 | Fill #2

## 2020-10-03 MED FILL — PRASUGREL HCL 10 MG TABS: 10 | 90 days supply | Qty: 90 | Fill #1

## 2020-10-04 ENCOUNTER — Other Ambulatory Visit (HOSPITAL_BASED_OUTPATIENT_CLINIC_OR_DEPARTMENT_OTHER): Payer: Self-pay | Admitting: Family Medicine

## 2020-10-04 MED FILL — LOSARTAN POTASSIUM 50 MG TA: 50 | 90 days supply | Qty: 90 | Fill #0

## 2020-10-07 ENCOUNTER — Telehealth: Payer: Self-pay | Admitting: Pharmacist

## 2020-10-07 NOTE — Telephone Encounter (Signed)
Received PA request from covermymeds. PA submitted for Repatha

## 2020-10-10 NOTE — Telephone Encounter (Signed)
Repatha approved through 10/08/2021

## 2020-10-11 MED FILL — REPATHA SURECLICK 140 MG/ML: 140 | 84 days supply | Qty: 6 | Fill #3

## 2020-10-17 ENCOUNTER — Ambulatory Visit (INDEPENDENT_AMBULATORY_CARE_PROVIDER_SITE_OTHER): Payer: 59 | Admitting: Cardiothoracic Surgery

## 2020-10-17 ENCOUNTER — Other Ambulatory Visit: Payer: Self-pay

## 2020-10-17 VITALS — BP 150/79 | HR 67 | Resp 20 | Ht 62.0 in | Wt 198.0 lb

## 2020-10-17 DIAGNOSIS — C3492 Malignant neoplasm of unspecified part of left bronchus or lung: Secondary | ICD-10-CM

## 2020-10-17 NOTE — Progress Notes (Signed)
[address]       ,[place] [address]             [phone]                  Sarah Stevenson [facility] Medical Record #[id number] Date of Birth: 02/24/1956  Referring QQ:[id number], Jama Flavors, MD Primary Cardiology:Dr Daneen Schick Primary Care:Christain Sacramento, MD Oncology: Dr Julien Nordmann   Chief Complaint:  Follow Up Visit OPERATIVE REPORT DATE OF PROCEDURE:  05/17/2018 PREOPERATIVE DIAGNOSES:  Severe aortic stenosis and coronary occlusive disease. POSTOPERATIVE DIAGNOSES: 1.  Severe aortic stenosis with a trileaflet aortic valve and coronary occlusive disease. 2.  Aortic stenosis with a trileaflet aortic valve 3.  Coronary occlusive disease. 4.  Left upper lobe adenocarcinoma of the lung. PROCEDURES PERFORMED:   1.  Aortic valve replacement with pericardial tissue valve, [employer] model 3300 TFX 21 mm, serial number [id number]. 2.  Coronary artery bypass grafting x2 with the left internal mammary to the left anterior descending coronary artery and reverse saphenous vein graft to the distal right coronary artery with right thigh endovein harvesting.   3.  Wedge resection, left upper lobe lung lesion.  Diagnosis 1. Lung, wedge biopsy/resection, Left Upper Lobe - ADENOCARCINOMA, WELL DIFFERENTIATED, SPANNING 1.2 CM. - SEE ONCOLOGY TABLE BELOW. 2. Lung, wedge biopsy/resection, Left Upper Lobe - ADENOCARCINOMA. 3. Lung, wedge biopsy/resection, Left Upper Lobe - BENIGN LUNG PARENCHYMA. - THERE IS NO EVIDENCE OF MALIGNANCY. 4. Heart valve leaflets - NODULAR FIBROSIS AND CALCIFICATION. Microscopic Comment 1. LUNG: Procedure: Wedge resection and additional margin resections Specimen Laterality: Left upper lobe Tumor Site: Left upper lobe Tumor Size: 1.2 cm (gross measurement) Tumor Focality: Unifocal Histologic Type: Adenocarcinoma, well differentiated Visceral Pleura Invasion: Not identified. Lymphovascular Invasion: Not identified Direct Invasion  of Adjacent Structures: Not identified. Margins: Greater than 0.2 cm to parenchymal margin Treatment Effect: N/A Regional Lymph Nodes: None examined Pathologic Stage Classification (pTNM, AJCC 8th Edition): pT1a, pNX Ancillary Studies: Can be performed upon clinician request Representative Tumor Block: 1B (JBK:kh 05/19/18)   History of Present Illness: Patient returns to the office for follow-up after aortic valve replacement coronary artery bypass for symptomatic severe aortic stenosis and coronary artery disease.  The groundglass opacity in the left upper lobe was incidentally noted on preop CT scan a wedge resection at the time of surgery confirmed the status adenocarcinoma.  The patient's had subsequent serial CT scans.  She notes that she is remained active without symptoms of angina or congestive heart failure.  Recently her son and daughter-in-law and grandchildren moved into her house for several weeks.  She had been vaccinated for Covid x3.  But did have a positive test in December, at the time the test took 14 days to get the result was positive but she had no symptoms.   Zubrod Score: At the time of surgery this patient's most appropriate activity status/level should be described as: []     0    Normal activity, no symptoms [x]     1    Restricted in physical strenuous activity but ambulatory, able to do out light work []     2    Ambulatory and capable of self care, unable to do work activities, up and about                 >50 % of waking hours                                                                                   []   3    Only limited self care, in bed greater than 50% of waking hours []     4    Completely disabled, no self care, confined to bed or chair []     5    Moribund  Social History   Tobacco Use  Smoking Status Former Smoker  . Packs/day: 1.00  . Years: 10.00  . Pack years: 10.00  . Types: Cigarettes  . Quit date: 64  . Years since quitting: 40.1   Smokeless Tobacco Never Used  Tobacco Comment   restarted  about 5-6 yrs ago, then stopped       Allergies  Allergen Reactions  . Brilinta [Ticagrelor] Shortness Of Breath  . Lisinopril Cough  . Asa [Aspirin] Hives and Rash  . Blue Dyes (Parenteral) Hives  . Corn-Containing Products Hives  . Nsaids Hives and Rash  . Sugar-Protein-Starch Hives  . Tea Hives  . Rosuvastatin     myalgias  . Zetia [Ezetimibe]     myalgias  . Isosulfan Blue Rash  . Other Itching and Rash    CHG soap    Current Outpatient Medications  Medication Sig Dispense Refill  . acetaminophen (TYLENOL) 500 MG tablet Take 500-1,000 mg by mouth 2 (two) times daily as needed for moderate pain.    . carvedilol (COREG) 6.25 MG tablet TAKE 1 TABLET (6.25 MG TOTAL) BY MOUTH 2 (TWO) TIMES DAILY WITH A MEAL. 180 tablet 3  . celecoxib (CELEBREX) 200 MG capsule Take 200 mg by mouth as needed.     . cetirizine (ZYRTEC) 10 MG tablet Take 10 mg by mouth daily.    . famotidine (PEPCID) 20 MG tablet Take 20 mg by mouth at bedtime.    . fluticasone (FLONASE) 50 MCG/ACT nasal spray Place 1 spray into both nostrils daily as needed for allergies or rhinitis.    Marland Kitchen glipiZIDE (GLUCOTROL XL) 10 MG 24 hr tablet Take 10 mg by mouth daily with breakfast.     . glucose blood (ONETOUCH ULTRA) test strip TEST BLOOD SUGAR ONCE A DAY    . losartan (COZAAR) 50 MG tablet Take 50 mg by mouth daily.    . Menthol-Methyl Salicylate (MUSCLE RUB EX) Apply 1 application topically as needed (for pain).    . metFORMIN (GLUCOPHAGE) 850 MG tablet Take 850 mg by mouth 2 (two) times daily with a meal.     . prasugrel (EFFIENT) 10 MG TABS tablet TAKE 1 TABLET (10 MG TOTAL) BY MOUTH DAILY. 90 tablet 2  . REPATHA SURECLICK 578 MG/ML SOAJ INJECT 140MG  INTO THE SKIN EVERY 14 DAYS 6 mL 3  . sertraline (ZOLOFT) 50 MG tablet Take 25 mg by mouth daily.     Marland Kitchen spironolactone (ALDACTONE) 25 MG tablet Take 0.5 tablets (12.5 mg total) by mouth daily. 45 tablet 3  .  TRULICITY 4.69 GE/9.5MW SOPN Inject 0.75 mg into the skin once a week.   3   No current facility-administered medications for this visit.       Physical Exam: Ht 5\' 2"  (1.575 m)   BMI 37.46 kg/m  General appearance: alert, cooperative and no distress Neck: no adenopathy, no carotid bruit, no JVD, supple, symmetrical, trachea midline and thyroid not enlarged, symmetric, no tenderness/mass/nodules Lymph nodes: Cervical, supraclavicular, and axillary nodes normal. Resp: clear to auscultation bilaterally Cardio: regular rate and rhythm, S1, S2 normal, no murmur, click, rub or gallop Extremities: extremities normal, atraumatic, no cyanosis or edema Neurologic: Grossly normal Patient has no murmur of aortic insufficiency  Sternal incision is well-healed  Diagnostic Studies & Laboratory data:         Recent Radiology Findings: CLINICAL DATA:  Restaging non-small cell lung cancer. Status post left upper lobe wedge resection.  EXAM: CT CHEST WITH CONTRAST  TECHNIQUE: Multidetector CT imaging of the chest was performed during intravenous contrast administration.  CONTRAST:  71mL OMNIPAQUE IOHEXOL 300 MG/ML  SOLN  COMPARISON:  01/11/2020  FINDINGS: Cardiovascular: Previous median sternotomy and CABG procedure. No pericardial effusion. Normal heart size.  Mediastinum/Nodes: No enlarged mediastinal, hilar, or axillary lymph nodes. Thyroid gland, trachea, and esophagus demonstrate no significant findings.  Lungs/Pleura: Postop change from left upper lobe lung resection. No complicating features identified. Left upper lobe suture line is stable. No suspicious features to suggest local tumor recurrence. Left upper lobe non solid nodule is unchanged measuring 4 mm, image 32/5. Faint non solid nodule within the anterolateral right upper lobe is unchanged measuring 4 mm, image 57/5. Subpleural nodule within the anterior right middle lobe is also unchanged measuring 3 mm. No  new suspicious lung nodules identified.  Upper Abdomen: No acute abnormality.  Musculoskeletal: No chest wall abnormality. No acute or significant osseous findings. Multi level degenerative disc disease identified. No suspicious bone lesions identified.  IMPRESSION: 1. Stable CT of the chest. No findings to suggest local tumor recurrence or metastatic disease. 2. Small non-solid nodules within both lungs are unchanged from previous exam. 3. Previous median sternotomy and CABG procedure.   Electronically Signed   By: Kerby Moors M.D.   On: 07/15/2020 13:13    I have independently reviewed the above radiology studies  and reviewed the findings with the patient.    EXAM: CT CHEST WITH CONTRAST  TECHNIQUE: Multidetector CT imaging of the chest was performed during intravenous contrast administration.  CONTRAST:  3mL OMNIPAQUE IOHEXOL 300 MG/ML  SOLN  COMPARISON:  Most recent CT chest 07/13/2019.  06/29/2018 PET-CT.  FINDINGS: Cardiovascular: Normal heart size. No significant pericardial effusion/thickening. Three-vessel coronary atherosclerosis status post CABG. Aortic valve prosthesis in place. Atherosclerotic nonaneurysmal thoracic aorta. Normal caliber pulmonary arteries. No central pulmonary emboli.  Mediastinum/Nodes: No discrete thyroid nodules. Unremarkable esophagus. No pathologically enlarged axillary, mediastinal or hilar lymph nodes.  Lungs/Pleura: No pneumothorax. No pleural effusion. Stable curvilinear parenchymal band in the left upper lobe along the wedge resection suture site. No acute consolidative airspace disease or lung masses. Solid 3 mm anterior right middle lobe pulmonary nodule (series 7/image 88) and scattered small posterior right upper lobe ground-glass pulmonary nodules up to 4 mm (series 7/image 75) are all stable. No new significant pulmonary nodules.  Upper abdomen: No acute abnormality.  Musculoskeletal: No  aggressive appearing focal osseous lesions. Intact sternotomy wires. Moderate thoracic spondylosis.  IMPRESSION: 1. No evidence of local tumor recurrence in the left upper lobe status post wedge resection. 2. No evidence of metastatic disease in the chest. 3. Tiny solid and ground-glass right pulmonary nodules are all stable. 4. Aortic Atherosclerosis (ICD10-I70.0).   Electronically Signed   By: Ilona Sorrel M.D.   On: 01/11/2020 13:14       CLINICAL DATA:  Lung cancer staging, status post wedge resection left upper lobe  EXAM: CT CHEST WITH CONTRAST  TECHNIQUE: Multidetector CT imaging of the chest was performed during intravenous contrast administration.  CONTRAST:  100mL OMNIPAQUE IOHEXOL 300 MG/ML  SOLN  COMPARISON:  01/09/2019  FINDINGS: Cardiovascular: Aortic atherosclerosis. Normal heart size. Status post aortic valve replacement. Extensive 3 vessel coronary artery calcifications and/or stents. No pericardial  effusion.  Mediastinum/Nodes: No enlarged mediastinal, hilar, or axillary lymph nodes. Thyroid gland, trachea, and esophagus demonstrate no significant findings.  Lungs/Pleura: Stable postoperative findings of left upper lobe wedge resection. Unchanged, scattered small mostly ground-glass nodules, primarily of the dependent right lung (series 7, image 33, 56). Dependent scarring of the right lower lobe. No pleural effusion or pneumothorax.  Upper Abdomen: No acute abnormality.  Hepatic steatosis.  Musculoskeletal: No chest wall mass or suspicious bone lesions identified.  IMPRESSION: 1. Stable postoperative findings of left upper lobe wedge resection. No evidence of malignant recurrence or metastatic disease in the chest.  2. Unchanged, scattered small mostly ground-glass nodules, primarily of the dependent right lung (series 7, image 33, 56), appearance and distribution likely infectious or inflammatory. Attention  on follow-up.  3.  Coronary artery disease.  Aortic Atherosclerosis (ICD10-I70.0).  4.  Hepatic steatosis.   Electronically Signed   By: Eddie Candle M.D.   On: 07/13/2019 14:09  CLINICAL DATA:  Non-small-cell lung cancer of the left upper lobe. Status post wedge resection.  EXAM: CT CHEST WITH CONTRAST  TECHNIQUE: Multidetector CT imaging of the chest was performed during intravenous contrast administration.  CONTRAST:  47mL OMNIPAQUE IOHEXOL 300 MG/ML  SOLN  COMPARISON:  PET-CT 06/29/2018.  FINDINGS: Cardiovascular: The heart size is normal. No substantial pericardial effusion. Status post AVR. Coronary artery calcification is evident. Atherosclerotic calcification is noted in the wall of the thoracic aorta.  Mediastinum/Nodes: No mediastinal lymphadenopathy. There is no hilar lymphadenopathy. The esophagus has normal imaging features. There is no axillary lymphadenopathy.  Lungs/Pleura: The central tracheobronchial airways are patent. 4 mm right upper lobe subpleural nodule is unchanged in the interval. Stable 5 mm ground-glass nodule in the lateral right lung (67/7). Architectural distortion and scarring noted left upper lobe with associated staple line. Soft tissue attenuation associated with the staple line has decreased in the interval. No new suspicious pulmonary nodule or mass on today's study. No pleural effusion. Scarring noted medial right lower lobe.  Upper Abdomen: Small focus of subcapsular enhancement in the posterior right liver (142/2) is stable since 04/15/2018. This is probably a vascular malformation. Cortical scarring noted in the incompletely visualized kidneys.  Musculoskeletal: No worrisome lytic or sclerotic osseous abnormality.  IMPRESSION: 1. Interval decrease in the volume of soft tissue associated with the left upper lobe surgical site. 2. Stable tiny right lung nodules. Continued attention on  follow-up recommended. 3. No change small focus of subcapsular hyper enhancement in the posterior right liver comparing back to 04/15/2018. Probably vascular malformation. This could also be reassessed at the time of follow-up imaging. 4.  Aortic Atherosclerois (ICD10-170.0)   Electronically Signed   By: Misty Stanley M.D.   On: 01/10/2019 08:53     CLINICAL DATA:  Initial treatment strategy for pulmonary nodule.  EXAM: NUCLEAR MEDICINE PET SKULL BASE TO THIGH  TECHNIQUE: 9.6 mCi F-18 FDG was injected intravenously. Full-ring PET imaging was performed from the skull base to thigh after the radiotracer. CT data was obtained and used for attenuation correction and anatomic localization.  Fasting blood glucose: 128 mg/dl  COMPARISON:  Chest CT 04/15/2018  FINDINGS: Mediastinal blood pool activity: SUV max 2.9  NECK: No hypermetabolic lymph nodes in the neck.  Incidental CT findings: none  CHEST: Interval midline sternotomy and CABG. Moderate metabolic activity along the sternum related to postsurgical inflammation.  Interval LEFT upper lobe wedge resection. There is staple line with parenchymal thickening in the LEFT upper lobe. There is mild metabolic activity  along this surgical margin with SUV max equal 3.9 which is felt benign postsurgical change. No discrete hypermetabolic nodule.  No hypermetabolic mediastinal lymph nodes. There is a focus of atelectasis along the left cardiac border with SUV max equal 4.0 (image 81) which is also favored benign.  Incidental CT findings: none  ABDOMEN/PELVIS: No abnormal hypermetabolic activity within the liver, pancreas, adrenal glands, or spleen. No hypermetabolic lymph nodes in the abdomen or pelvis.  Intense physiologic activity in the bowel.  Incidental CT findings: Uterus and ovaries normal  SKELETON: No focal hypermetabolic activity to suggest skeletal metastasis.  Incidental CT findings:  none  IMPRESSION: 1. Postsurgical change in the LEFT upper lobe without clear evidence of carcinoma. Recommend follow-up CT surveillance. 2. No evidence of metastatic disease. 3. Interval sternotomy and CABG   Electronically Signed   By: Suzy Bouchard M.D.   On: 06/30/2018 08:20   Recent Labs: Lab Results  Component Value Date   WBC 9.6 07/15/2020   HGB 13.2 07/15/2020   HCT 39.6 07/15/2020   PLT 224 07/15/2020   GLUCOSE 201 (H) 07/15/2020   CHOL 95 (L) 06/05/2019   TRIG 151 (H) 06/05/2019   HDL 27 (L) 06/05/2019   LDLCALC 42 06/05/2019   ALT 14 07/15/2020   AST 12 (L) 07/15/2020   NA 136 07/15/2020   K 4.5 07/15/2020   CL 105 07/15/2020   CREATININE 1.08 (H) 07/15/2020   BUN 15 07/15/2020   CO2 22 07/15/2020   INR 1.53 05/17/2018   HGBA1C 9.3 (H) 05/12/2018      Assessment / Plan:  #1 status post coronary artery bypass grafting and aortic valve replacement with tissue valve-her functional status is significant proved.  -Continued follow-up per Dr. Tamala Julian cardiology    #2 non-small cell carcinoma of the lung well differentiated 1.2 cm left upper lobe -without evidence of recurrence on CT scan 07/2020 -follow-up scan is ordered by oncology for this year-continued follow-up Dr. Julien Nordmann  I have not made the patient a return appointment in the surgical office as she is closely followed both by cardiology and medical oncology.    Grace Isaac 10/17/2020 10:13 AM

## 2020-10-31 ENCOUNTER — Other Ambulatory Visit (HOSPITAL_BASED_OUTPATIENT_CLINIC_OR_DEPARTMENT_OTHER): Payer: Self-pay | Admitting: Family Medicine

## 2020-10-31 MED FILL — METFORMIN HCL 850 MG TABS: 850 | 90 days supply | Qty: 180 | Fill #0

## 2020-11-08 ENCOUNTER — Other Ambulatory Visit: Payer: Self-pay | Admitting: Interventional Cardiology

## 2020-11-08 MED FILL — CELECOXIB 200 MG CAP: 200 | 90 days supply | Qty: 90 | Fill #2

## 2020-11-08 MED FILL — CARVEDILOL 6.25 MG TABLET: 6.25 | 90 days supply | Qty: 180 | Fill #0

## 2020-11-08 MED FILL — glipiZIDE ER 10 MG TB24: 10 | 90 days supply | Qty: 90 | Fill #0

## 2020-12-07 ENCOUNTER — Other Ambulatory Visit (HOSPITAL_BASED_OUTPATIENT_CLINIC_OR_DEPARTMENT_OTHER): Payer: Self-pay

## 2021-01-01 ENCOUNTER — Other Ambulatory Visit (HOSPITAL_BASED_OUTPATIENT_CLINIC_OR_DEPARTMENT_OTHER): Payer: Self-pay

## 2021-01-01 ENCOUNTER — Other Ambulatory Visit (HOSPITAL_COMMUNITY): Payer: Self-pay

## 2021-01-01 MED ORDER — LOSARTAN POTASSIUM 50 MG PO TABS
ORAL_TABLET | ORAL | 0 refills | Status: DC
Start: 2021-01-01 — End: 2021-02-13
  Filled 2021-01-01: qty 90, 90d supply, fill #0

## 2021-01-01 MED FILL — Sertraline HCl Tab 50 MG: ORAL | 90 days supply | Qty: 90 | Fill #0 | Status: AC

## 2021-01-01 MED FILL — Prasugrel HCl Tab 10 MG (Base Equiv): ORAL | 90 days supply | Qty: 90 | Fill #0 | Status: AC

## 2021-01-02 ENCOUNTER — Other Ambulatory Visit (HOSPITAL_BASED_OUTPATIENT_CLINIC_OR_DEPARTMENT_OTHER): Payer: Self-pay

## 2021-01-28 ENCOUNTER — Other Ambulatory Visit: Payer: Self-pay | Admitting: Interventional Cardiology

## 2021-01-28 ENCOUNTER — Other Ambulatory Visit (HOSPITAL_COMMUNITY): Payer: Self-pay

## 2021-01-28 MED FILL — Carvedilol Tab 6.25 MG: ORAL | 90 days supply | Qty: 180 | Fill #0 | Status: AC

## 2021-01-28 MED FILL — Dulaglutide Soln Auto-injector 0.75 MG/0.5ML: SUBCUTANEOUS | 84 days supply | Qty: 6 | Fill #0 | Status: AC

## 2021-01-28 MED FILL — Celecoxib Cap 200 MG: ORAL | 90 days supply | Qty: 90 | Fill #0 | Status: AC

## 2021-01-28 MED FILL — Glipizide Tab ER 24HR 10 MG: ORAL | 90 days supply | Qty: 90 | Fill #0 | Status: AC

## 2021-01-29 ENCOUNTER — Other Ambulatory Visit (HOSPITAL_BASED_OUTPATIENT_CLINIC_OR_DEPARTMENT_OTHER): Payer: Self-pay

## 2021-01-29 MED ORDER — METFORMIN HCL 850 MG PO TABS
ORAL_TABLET | ORAL | 0 refills | Status: DC
  Filled 2021-01-29: qty 180, 90d supply, fill #0

## 2021-01-29 MED ORDER — REPATHA SURECLICK 140 MG/ML ~~LOC~~ SOAJ
SUBCUTANEOUS | 3 refills | Status: DC
  Filled 2021-01-29: qty 6, 84d supply, fill #0
  Filled 2021-04-16: qty 6, 84d supply, fill #1
  Filled 2021-07-30: qty 6, 84d supply, fill #2
  Filled 2021-11-11: qty 6, 84d supply, fill #3

## 2021-01-31 ENCOUNTER — Other Ambulatory Visit (HOSPITAL_BASED_OUTPATIENT_CLINIC_OR_DEPARTMENT_OTHER): Payer: Self-pay

## 2021-02-13 ENCOUNTER — Other Ambulatory Visit (HOSPITAL_BASED_OUTPATIENT_CLINIC_OR_DEPARTMENT_OTHER): Payer: Self-pay

## 2021-02-13 MED ORDER — TRIAMCINOLONE ACETONIDE 0.1 % EX CREA
TOPICAL_CREAM | CUTANEOUS | 2 refills | Status: DC
  Filled 2021-02-13: qty 80, 30d supply, fill #0
  Filled 2021-05-19: qty 75, 30d supply, fill #1

## 2021-02-13 MED ORDER — SERTRALINE HCL 50 MG PO TABS
ORAL_TABLET | ORAL | 3 refills | Status: DC
  Filled 2021-02-13 – 2021-04-04 (×2): qty 90, 90d supply, fill #0

## 2021-02-13 MED ORDER — CELECOXIB 200 MG PO CAPS
ORAL_CAPSULE | ORAL | 3 refills | Status: DC
  Filled 2021-02-13 – 2021-05-06 (×2): qty 90, 90d supply, fill #0

## 2021-02-13 MED ORDER — GLIPIZIDE ER 10 MG PO TB24
ORAL_TABLET | ORAL | 3 refills | Status: DC
  Filled 2021-02-13 – 2021-05-06 (×2): qty 90, 90d supply, fill #0
  Filled 2021-07-30: qty 90, 90d supply, fill #1
  Filled 2021-11-11: qty 90, 90d supply, fill #2

## 2021-02-13 MED ORDER — METHOCARBAMOL 500 MG PO TABS
ORAL_TABLET | ORAL | 2 refills | Status: DC
  Filled 2021-02-13: qty 40, 10d supply, fill #0
  Filled 2021-05-19: qty 40, 10d supply, fill #1
  Filled 2021-07-07: qty 40, 10d supply, fill #2

## 2021-02-13 MED ORDER — PREDNISONE 20 MG PO TABS
ORAL_TABLET | ORAL | 0 refills | Status: DC
  Filled 2021-02-13: qty 15, 8d supply, fill #0

## 2021-02-13 MED ORDER — MONTELUKAST SODIUM 10 MG PO TABS
ORAL_TABLET | ORAL | 5 refills | Status: DC
  Filled 2021-02-13: qty 30, 30d supply, fill #0
  Filled 2021-03-19: qty 30, 30d supply, fill #1
  Filled 2021-04-16: qty 30, 30d supply, fill #2
  Filled 2021-05-19 – 2021-07-03 (×2): qty 30, 30d supply, fill #3
  Filled 2021-07-30: qty 30, 30d supply, fill #4
  Filled 2021-12-09: qty 30, 30d supply, fill #5

## 2021-02-13 MED ORDER — LOSARTAN POTASSIUM 50 MG PO TABS
ORAL_TABLET | ORAL | 3 refills | Status: DC
  Filled 2021-02-13 – 2021-04-04 (×2): qty 90, 90d supply, fill #0
  Filled 2021-07-03: qty 90, 90d supply, fill #1
  Filled 2021-10-14: qty 90, 90d supply, fill #2
  Filled 2022-01-15: qty 90, 90d supply, fill #3

## 2021-02-14 ENCOUNTER — Other Ambulatory Visit (HOSPITAL_BASED_OUTPATIENT_CLINIC_OR_DEPARTMENT_OTHER): Payer: Self-pay

## 2021-02-17 ENCOUNTER — Other Ambulatory Visit (HOSPITAL_BASED_OUTPATIENT_CLINIC_OR_DEPARTMENT_OTHER): Payer: Self-pay

## 2021-02-17 MED ORDER — MONTELUKAST SODIUM 10 MG PO TABS
ORAL_TABLET | ORAL | 5 refills | Status: DC
  Filled 2021-05-19: qty 30, 30d supply, fill #0

## 2021-02-17 MED ORDER — PREDNISONE 20 MG PO TABS: ORAL_TABLET | ORAL | 0 refills | Status: DC

## 2021-03-20 ENCOUNTER — Other Ambulatory Visit (HOSPITAL_BASED_OUTPATIENT_CLINIC_OR_DEPARTMENT_OTHER): Payer: Self-pay

## 2021-04-04 ENCOUNTER — Other Ambulatory Visit (HOSPITAL_BASED_OUTPATIENT_CLINIC_OR_DEPARTMENT_OTHER): Payer: Self-pay

## 2021-04-04 ENCOUNTER — Other Ambulatory Visit: Payer: Self-pay | Admitting: Interventional Cardiology

## 2021-04-04 MED ORDER — PRASUGREL HCL 10 MG PO TABS
10.0000 mg | ORAL_TABLET | Freq: Every day | ORAL | 0 refills | Status: DC
  Filled 2021-04-04: qty 90, 90d supply, fill #0

## 2021-04-07 ENCOUNTER — Other Ambulatory Visit (HOSPITAL_BASED_OUTPATIENT_CLINIC_OR_DEPARTMENT_OTHER): Payer: Self-pay

## 2021-04-07 ENCOUNTER — Ambulatory Visit: Payer: 59 | Attending: Internal Medicine

## 2021-04-07 DIAGNOSIS — Z23 Encounter for immunization: Secondary | ICD-10-CM

## 2021-04-07 NOTE — Progress Notes (Signed)
   Covid-19 Vaccination Clinic  Name:  Sarah Stevenson    MRN: [id number] DOB: September 12, 1955  04/07/2021  [name] was observed post Covid-19 immunization for 15 minutes without incident. She was provided with Vaccine Information Sheet and instruction to access the V-Safe system.   [name] was instructed to call 911 with any severe reactions post vaccine: Difficulty breathing  Swelling of face and throat  A fast heartbeat  A bad rash all over body  Dizziness and weakness   Immunizations Administered     Name Date Dose VIS Date Route   PFIZER Comrnaty(Gray TOP) Covid-19 Vaccine 04/07/2021 11:27 AM 0.3 mL 08/15/2020 Intramuscular   Manufacturer: [employer]   Lot: [id number]   NDC: [phone]

## 2021-04-10 ENCOUNTER — Other Ambulatory Visit (HOSPITAL_BASED_OUTPATIENT_CLINIC_OR_DEPARTMENT_OTHER): Payer: Self-pay

## 2021-04-10 MED ORDER — COVID-19 MRNA VAC-TRIS(PFIZER) 30 MCG/0.3ML IM SUSP
INTRAMUSCULAR | 0 refills | Status: DC
  Filled 2021-04-10: qty 0.3, 1d supply, fill #0

## 2021-04-14 ENCOUNTER — Other Ambulatory Visit: Payer: Self-pay

## 2021-04-14 ENCOUNTER — Emergency Department
Admission: EM | Admit: 2021-04-14 | Discharge: 2021-04-14 | Disposition: A | Discharge status: Home / Self Care | Payer: 59 | Source: Home / Self Care

## 2021-04-14 DIAGNOSIS — J309 Allergic rhinitis, unspecified: Secondary | ICD-10-CM

## 2021-04-14 DIAGNOSIS — J0100 Acute maxillary sinusitis, unspecified: Secondary | ICD-10-CM

## 2021-04-14 DIAGNOSIS — J3489 Other specified disorders of nose and nasal sinuses: Secondary | ICD-10-CM

## 2021-04-14 MED ORDER — PREDNISONE 20 MG PO TABS
ORAL_TABLET | ORAL | 0 refills | Status: DC
  Filled 2021-04-14: qty 15, 5d supply, fill #0

## 2021-04-14 MED ORDER — FEXOFENADINE HCL 180 MG PO TABS
180.0000 mg | ORAL_TABLET | Freq: Every day | ORAL | 0 refills | Status: DC
  Filled 2021-04-14: qty 30, 30d supply, fill #0

## 2021-04-14 MED ORDER — AMOXICILLIN-POT CLAVULANATE 875-125 MG PO TABS
1.0000 | ORAL_TABLET | Freq: Two times a day (BID) | ORAL | 0 refills | Status: AC
  Filled 2021-04-14: qty 10, 5d supply, fill #0

## 2021-04-14 NOTE — Discharge Instructions (Addendum)
Advised/instructed patient to take medication as directed with food to completion.  Advised patient to discontinue pseudoephedrine and Zyrtec and start Allegra.  Advised/encouraged patient to increase daily water intake while taking these medications.

## 2021-04-14 NOTE — ED Triage Notes (Addendum)
Pt st she has had sinus issues since October 1st in 2021. Pt has been seen multiple times since these symptoms. Pt has also had covid in dec 2021. Pt st she saw her primary care doctor in June and he started her on singular for the symptoms and the medication helped a lot. Pt then went swimming at the end of June and since that she has started feeling bad again. Pt st she has been on pseudoephedrine and musinex and Flonase. Pt presents with pain in her teeth and pressure in her head and face. Pt has not seen an ENT. Pt st she will be on medicare in October since she recently became retired. Pt st she is trying to see an ENT once that goes into effect.    3 years ago pt had lung cancer, arotic valve replacement, double bypass.

## 2021-04-14 NOTE — ED Provider Notes (Signed)
[facility]    CSN: [id number] Arrival date & time: 04/14/21  1711      History   Chief Complaint Chief Complaint  Patient presents with   Facial Pain    HPI Sarah Stevenson is a 65 y.o. female.   HPI 65 year old female presents with sinus nasal congestion for 6-8 weeks.  Patient reports current reports multiple visits for sinus infections.  Additionally, patient reports sinus pressure for 6 to 8 weeks as well.  Past Medical History:  Diagnosis Date   Anginal pain ([place])    Aortic stenosis    Coronary artery disease    Depression    "mild" (08/06/2016)   Dyspnea    Hyperlipidemia    lung ca dx'd 05/2018   Murmur, cardiac    Myocardial infarction ([place]) 2016   Osteoarthritis    "left hip and knee" (08/06/2016)   Type II diabetes mellitus ([place])     Patient Active Problem List   Diagnosis Date Noted   H/O aortic valve replacement 07/03/2018   Adenocarcinoma of lung, left ([place]) 05/20/2018   Coronary artery disease involving coronary bypass graft of native heart with angina pectoris ([place]) 05/17/2018   DOE (dyspnea on exertion)    Trochanteric bursitis, right hip 12/01/2017   Unilateral primary osteoarthritis, right hip 12/01/2017   Status post total replacement of left hip 04/23/2017   Pain in left hip 03/31/2017   Unilateral primary osteoarthritis, left hip 03/31/2017   Sciatica, left side 03/31/2017   Severe aortic stenosis    Positive cardiac stress test    Trochanteric bursitis of left hip 08/14/2015   Arthritis of left hip 08/14/2015   Excessive daytime sleepiness 03/03/2015   Snoring 12/20/2014   Type 2 diabetes mellitus with complication ([place])    Hyperlipidemia     Past Surgical History:  Procedure Laterality Date   AORTIC VALVE REPLACEMENT N/A 05/17/2018   Procedure: AORTIC VALVE REPLACEMENT (AVR) USING 21 MM MAGNA EASE PERICARDIAL BIOPROSTHESIS. MODEL # [id number]. SERIAL # [id number];  Surgeon: Grace Isaac, MD;  Location: [place];  Service:  Open Heart Surgery;  Laterality: N/A;   CARDIAC CATHETERIZATION Right 11/30/2014   Procedure: RIGHT/LEFT HEART CATH AND CORONARY ANGIOGRAPHY;  Surgeon: Belva Crome, MD;  Location[facility] C[facility] LAB;  Service: Cardiovascular;  Laterality: Right;   CARDIAC CATHETERIZATION N/A 12/13/2015   Procedure: Left Heart Cath and Coronary Angiography;  Surgeon: Leonie Man, MD;  Location: [place] CV LAB;  Service: Cardiovascular;  Laterality: N/A;   CARDIAC CATHETERIZATION N/A 12/13/2015   Procedure: Intravascular Pressure Wire/FFR Study;  Surgeon: Leonie Man, MD;  Location: [place] CV LAB;  Service: Cardiovascular;  Laterality: N/A;  RCA   CARDIAC CATHETERIZATION N/A 08/06/2016   Procedure: Right/Left Heart Cath and Coronary Angiography;  Surgeon: Lorretta Harp, MD;  Location: [place] CV LAB;  Service: Cardiovascular;  Laterality: N/A;   CARDIAC CATHETERIZATION N/A 08/06/2016   Procedure: Coronary Stent Intervention;  Surgeon: Lorretta Harp, MD;  Location: [place] CV LAB;  Service: Cardiovascular;  Laterality: N/A;   distal rca 3.0x16 and 3.0x8 synergy   [place] WITH STENT PLACEMENT  08/06/2016   "2 stents"   CORONARY ARTERY BYPASS GRAFT N/A 05/17/2018   Procedure: CORONARY ARTERY BYPASS GRAFTING (CABG) X 2 WITH ENDOSCOPIC HARVESTING OF RIGHT SAPHENOUS VEIN. LIMA TO LAD. SVG TO RCA;  Surgeon: Grace Isaac, MD;  Location: [place];  Service: Open Heart Surgery;  Laterality:  N/A;   HYSTEROSCOPY DIAGNOSTIC  1990s   JOINT REPLACEMENT     KNEE ARTHROSCOPY Left 1996   NASAL SINUS SURGERY  1977   RIGHT/LEFT HEART CATH AND CORONARY ANGIOGRAPHY N/A 04/01/2018   Procedure: RIGHT/LEFT HEART CATH AND CORONARY ANGIOGRAPHY;  Surgeon: Belva Crome, MD;  Location: [place] CV LAB;  Service: Cardiovascular;  Laterality: N/A;   TEE WITHOUT CARDIOVERSION N/A 05/17/2018   Procedure: TRANSESOPHAGEAL ECHOCARDIOGRAM (TEE);  Surgeon: Grace Isaac, MD;   Location: [place];  Service: Open Heart Surgery;  Laterality: N/A;   TOTAL HIP ARTHROPLASTY Left 04/23/2017   Procedure: LEFT TOTAL HIP ARTHROPLASTY ANTERIOR APPROACH;  Surgeon: Mcarthur Rossetti, MD;  Location: [facility];  Service: Orthopedics;  Laterality: Left;   WEDGE RESECTION Left 05/17/2018   Procedure: LEFT UPPER LOBE LUNG WEDGE RESECTION;  Surgeon: Grace Isaac, MD;  Location: [place];  Service: Open Heart Surgery;  Laterality: Left;    OB History   No obstetric history on file.      Home Medications    Prior to Admission medications   Medication Sig Start Date End Date Taking? Authorizing Provider  amoxicillin-clavulanate (AUGMENTIN) 875-125 MG tablet Take 1 tablet by mouth 2 (two) times daily for 5 days. 04/14/21 04/19/21 Yes Eliezer Lofts, FNP  fexofenadine [facility] ALLERGY) 180 MG tablet Take 1 tablet (180 mg total) by mouth daily for 15 days. 04/14/21 04/29/21 Yes Eliezer Lofts, FNP  predniSONE (DELTASONE) 20 MG tablet Take 3 tabs PO daily x 5 days. 04/14/21  Yes Eliezer Lofts, FNP  acetaminophen (TYLENOL) 500 MG tablet Take 500-1,000 mg by mouth 2 (two) times daily as needed for moderate pain.    [provider]  carvedilol (COREG) 6.25 MG tablet TAKE 1 TABLET (6.25 MG TOTAL) BY MOUTH 2 (TWO) TIMES DAILY WITH A MEAL. 11/08/20 11/08/21  Belva Crome, MD  celecoxib (CELEBREX) 200 MG capsule Take 200 mg by mouth as needed.  05/04/19   [provider]  celecoxib (CELEBREX) 200 MG capsule Take one tablet daily as needed for leg pain 02/13/21     cetirizine (ZYRTEC) 10 MG tablet Take 10 mg by mouth daily.    [provider]  COVID-19 mRNA Vac-TriS, Pfizer, SUSP injection Inject into the muscle. 04/07/21   Carlyle Basques, MD  Dulaglutide 0.75 MG/0.5ML SOPN INJECT 0.75 MG INTO THE SKIN EVERY 7 DAYS. 02/19/20 04/25/21  Christain Sacramento, MD  Evolocumab (REPATHA SURECLICK) 353 MG/ML SOAJ INJECT 140 MG INTO THE SKIN EVERY 14 DAYS 01/29/21   Belva Crome, MD  famotidine  (PEPCID) 20 MG tablet Take 20 mg by mouth at bedtime.    [provider]  fluconazole (DIFLUCAN) 150 MG tablet TAKE 1 TABLET BY MOUTH ONCE FOR 1 DOSE. MAY TAKE A 2ND TABLET IF NO IMPROVEMENT AFTER 72 HOURS 07/16/20 07/16/21  Lazoff, Shawn P, DO  fluticasone (FLONASE) 50 MCG/ACT nasal spray Place 1 spray into both nostrils daily as needed for allergies or rhinitis.    [provider]  glipiZIDE (GLUCOTROL XL) 10 MG 24 hr tablet Take 10 mg by mouth daily with breakfast.  06/05/16   [provider]  glipiZIDE (GLUCOTROL XL) 10 MG 24 hr tablet TAKE 1 TABLET BY MOUTH DAILY FOR DIABETES CONTROL. 02/13/21     glucose blood (ONETOUCH ULTRA) test strip TEST BLOOD SUGAR ONCE A DAY 12/11/17   [provider]  losartan (COZAAR) 50 MG tablet Take 50 mg by mouth daily. 07/11/20   [provider]  losartan (COZAAR) 50 MG tablet TAKE 1 TABLET BY MOUTH ONCE DAILY TO PRESERVE KIDNEY FUNCTION 10/04/20 10/04/21  Christain Sacramento, MD  losartan (COZAAR) 50 MG tablet TAKE 1 TABLET BY MOUTH DAILY TO PRESERVE KIDNEY FUNCTION. 07/11/20 07/11/21  Christain Sacramento, MD  losartan (COZAAR) 50 MG tablet TAKE 1 TABLET BY MOUTH DAILY TO PRESERVE KIDNEY FUNCTION. 02/13/21     Menthol-Methyl Salicylate (MUSCLE RUB EX) Apply 1 application topically as needed (for pain).    [provider]  metFORMIN (GLUCOPHAGE) 850 MG tablet Take 850 mg by mouth 2 (two) times daily with a meal.  03/23/19   [provider]  metFORMIN (GLUCOPHAGE) 850 MG tablet TAKE 1 TABLET BY MOUTH TWICE DAILY TO CONTROL DIABETES 01/29/21     methocarbamol (ROBAXIN) 500 MG tablet Take one tablet 4 times a day as needed for muscle 02/13/21     montelukast (SINGULAIR) 10 MG tablet Take 1 tablet by mouth daily for allergy control 02/13/21     montelukast (SINGULAIR) 10 MG tablet Take 1 tablet by mouth once daily for allergy control 02/17/21     prasugrel (EFFIENT) 10 MG TABS tablet Take 1 tablet (10 mg total) by mouth daily. 04/04/21    Belva Crome, MD  sertraline (ZOLOFT) 50 MG tablet Take 25 mg by mouth daily.     [provider]  sertraline (ZOLOFT) 50 MG tablet TAKE 1 TABLET BY MOUTH ONCE DAILY TO CONTROL ANXIETY 02/13/21     Sod Picosulfate-Mag Ox-Cit Acd 10-3.5-12 MG-GM -GM/160ML SOLN TAKE AS DIRECTED BY MOUTH 06/13/20 06/13/21  Juanita Craver, MD  spironolactone (ALDACTONE) 25 MG tablet Take 0.5 tablets (12.5 mg total) by mouth daily. 05/31/20   Belva Crome, MD  spironolactone (ALDACTONE) 25 MG tablet TAKE 1 TABLET (25 MG TOTAL) BY MOUTH DAILY. 03/26/20 03/26/21  Belva Crome, MD  triamcinolone cream (KENALOG) 0.1 % Apply to rash up to 3 times a day as needed 0/2/72     TRULICITY 5.36 UY/4.0HK SOPN Inject 0.75 mg into the skin once a week.  05/26/18   [provider]    Family History Family History  Problem Relation Age of Onset   Thyroid disease Mother    Heart attack Mother    Dementia Mother    Heart attack Father    Sudden death Father    Hypertension Neg Hx    Hyperlipidemia Neg Hx    Diabetes Neg Hx     Social History Social History   Tobacco Use   Smoking status: Former    Packs/day: 1.00    Years: 10.00    Pack years: 10.00    Types: Cigarettes    Quit date: 1982    Years since quitting: 40.6   Smokeless tobacco: Never   Tobacco comments:    restarted  about 5-6 yrs ago, then stopped  Vaping Use   Vaping Use: Former  Substance Use Topics   Alcohol use: Not Currently    Comment: occansionally   Drug use: No     Allergies   Brilinta [ticagrelor], Lisinopril, Asa [aspirin], Blue dyes (parenteral), Corn-containing products, Nsaids, Sugar-protein-starch, Tea, Rosuvastatin, Zetia [ezetimibe], Isosulfan blue, and Other   Review of Systems Review of Systems  HENT:  Positive for congestion, sinus pressure and sinus pain.   All other systems reviewed and are negative.   Physical Exam Triage Vital Signs ED Triage Vitals  Enc Vitals Group     BP 04/14/21 1756 (!) 148/83  Pulse Rate 04/14/21 1756 76     Resp 04/14/21 1756 18     Temp 04/14/21 1756 (!) 97.5 F (36.4 C)     Temp Source 04/14/21 1756 Oral     SpO2 04/14/21 1756 96 %     Weight 04/14/21 1748 194 lb (88 kg)     Height 04/14/21 1748 5\' 2"  (1.575 m)     Head Circumference --      Peak Flow --      Pain Score 04/14/21 1746 4     Pain Loc --      Pain Edu? --      Excl. in [place]? --    No data found.  Updated Vital Signs BP (!) 148/83 (BP Location: Right Arm)   Pulse 76   Temp (!) 97.5 F (36.4 C) (Oral)   Resp 18   Ht 5\' 2"  (1.575 m)   Wt 194 lb (88 kg)   SpO2 96%   BMI 35.48 kg/m      Physical Exam Vitals and nursing note reviewed.  Constitutional:      General: She is not in acute distress.    Appearance: Normal appearance. She is obese. She is not ill-appearing.  HENT:     Head: Normocephalic and atraumatic.     Right Ear: Tympanic membrane and external ear normal.     Left Ear: Tympanic membrane and external ear normal.     Ears:     Comments: Eustachian tube dysfunction noted bilaterally    Nose: Congestion present.     Right Sinus: Maxillary sinus tenderness present.     Left Sinus: Maxillary sinus tenderness present.     Comments: Turbinates are erythematous    Mouth/Throat:     Mouth: Mucous membranes are moist.     Pharynx: Oropharynx is clear.     Comments: Moderate amount of clear drainage of posterior oropharynx noted Eyes:     Extraocular Movements: Extraocular movements intact.     Conjunctiva/sclera: Conjunctivae normal.     Pupils: Pupils are equal, round, and reactive to light.  Cardiovascular:     Rate and Rhythm: Normal rate and regular rhythm.     Pulses: Normal pulses.     Heart sounds: Normal heart sounds.  Pulmonary:     Effort: Pulmonary effort is normal. No respiratory distress.     Breath sounds: Normal breath sounds. No stridor. No wheezing, rhonchi or rales.  Musculoskeletal:        General: Normal range of motion.     Cervical back:  Normal range of motion and neck supple. Tenderness present.  Lymphadenopathy:     Cervical: No cervical adenopathy.  Skin:    General: Skin is warm and dry.  Neurological:     General: No focal deficit present.     Mental Status: She is alert and oriented to person, place, and time. Mental status is at baseline.  Psychiatric:        Mood and Affect: Mood normal.        Behavior: Behavior normal.        Thought Content: Thought content normal.     UC Treatments / Results  Labs (all labs ordered are listed, but only abnormal results are displayed) Labs Reviewed - No data to display  EKG   Radiology No results found.  Procedures Procedures (including critical care time)  Medications Ordered in UC Medications - No data to display  Initial Impression / Assessment and Plan / UC  Course  I have reviewed the triage vital signs and the nursing notes.  Pertinent labs & imaging results that were available during my care of the patient were reviewed by me and considered in my medical decision making (see chart for details).     MDM: 1.  Acute maxillary sinusitis-Rx'd Augmentin; 2.  Sinus pressure-Rx'd Prednisone burst x 5 days; 3.  Allergic rhinitis-Rx'd Allegra.  Patient reports taking prednisone and Allegra before without adverse reaction. Advised/instructed patient to take medication as directed with food to completion.  Advised patient to discontinue pseudoephedrine and Zyrtec and start Allegra.  Advised/encouraged patient to increase daily water intake while taking these medications.  Patient discharged home, hemodynamically stable. Final Clinical Impressions(s) / UC Diagnoses   Final diagnoses:  Acute maxillary sinusitis, recurrence not specified  Sinus pressure  Allergic rhinitis, unspecified seasonality, unspecified trigger     Discharge Instructions      Advised/instructed patient to take medication as directed with food to completion.  Advised patient to discontinue  pseudoephedrine and Zyrtec and start Allegra.  Advised/encouraged patient to increase daily water intake while taking these medications.     ED Prescriptions     Medication Sig Dispense Auth. Provider   amoxicillin-clavulanate (AUGMENTIN) 875-125 MG tablet Take 1 tablet by mouth 2 (two) times daily for 5 days. 10 tablet Eliezer Lofts, FNP   predniSONE (DELTASONE) 20 MG tablet Take 3 tabs PO daily x 5 days. 15 tablet Eliezer Lofts, FNP   fexofenadine [facility] ALLERGY) 180 MG tablet Take 1 tablet (180 mg total) by mouth daily for 15 days. 15 tablet Eliezer Lofts, FNP      PDMP not reviewed this encounter.   Eliezer Lofts, [place] 04/14/21 1843

## 2021-04-15 ENCOUNTER — Other Ambulatory Visit (HOSPITAL_BASED_OUTPATIENT_CLINIC_OR_DEPARTMENT_OTHER): Payer: Self-pay

## 2021-04-16 ENCOUNTER — Other Ambulatory Visit (HOSPITAL_COMMUNITY): Payer: Self-pay

## 2021-04-17 ENCOUNTER — Other Ambulatory Visit (HOSPITAL_BASED_OUTPATIENT_CLINIC_OR_DEPARTMENT_OTHER): Payer: Self-pay

## 2021-04-17 MED ORDER — TRULICITY 0.75 MG/0.5ML ~~LOC~~ SOAJ
SUBCUTANEOUS | 3 refills | Status: DC
  Filled 2021-04-17: qty 2, 28d supply, fill #0
  Filled 2021-05-19: qty 2, 28d supply, fill #1
  Filled 2021-07-03: qty 2, 28d supply, fill #2
  Filled 2021-07-30: qty 2, 28d supply, fill #3

## 2021-04-21 ENCOUNTER — Other Ambulatory Visit (HOSPITAL_BASED_OUTPATIENT_CLINIC_OR_DEPARTMENT_OTHER): Payer: Self-pay

## 2021-05-06 ENCOUNTER — Other Ambulatory Visit: Payer: Self-pay | Admitting: Interventional Cardiology

## 2021-05-06 ENCOUNTER — Other Ambulatory Visit (HOSPITAL_BASED_OUTPATIENT_CLINIC_OR_DEPARTMENT_OTHER): Payer: Self-pay

## 2021-05-06 MED ORDER — METFORMIN HCL 850 MG PO TABS
ORAL_TABLET | ORAL | 0 refills | Status: DC
  Filled 2021-05-06: qty 180, 90d supply, fill #0

## 2021-05-06 MED ORDER — AMOXICILLIN-POT CLAVULANATE 875-125 MG PO TABS
ORAL_TABLET | ORAL | 0 refills | Status: DC
  Filled 2021-05-06: qty 28, 14d supply, fill #0

## 2021-05-06 MED ORDER — CARVEDILOL 6.25 MG PO TABS
ORAL_TABLET | Freq: Two times a day (BID) | ORAL | 1 refills | Status: DC
  Filled 2021-05-06: qty 180, 90d supply, fill #0
  Filled 2021-07-30 (×2): qty 180, 90d supply, fill #1

## 2021-05-14 ENCOUNTER — Other Ambulatory Visit: Payer: Self-pay | Admitting: Otolaryngology

## 2021-05-14 ENCOUNTER — Other Ambulatory Visit: Payer: Self-pay | Admitting: Family Medicine

## 2021-05-14 DIAGNOSIS — Z1231 Encounter for screening mammogram for malignant neoplasm of breast: Secondary | ICD-10-CM

## 2021-05-14 DIAGNOSIS — J329 Chronic sinusitis, unspecified: Secondary | ICD-10-CM

## 2021-05-15 ENCOUNTER — Other Ambulatory Visit: Payer: Self-pay

## 2021-05-15 ENCOUNTER — Ambulatory Visit
Admission: RE | Admit: 2021-05-15 | Discharge: 2021-05-15 | Disposition: A | Discharge status: Home / Self Care | Payer: 59 | Source: Ambulatory Visit | Attending: Family Medicine | Admitting: Family Medicine

## 2021-05-15 DIAGNOSIS — Z1231 Encounter for screening mammogram for malignant neoplasm of breast: Secondary | ICD-10-CM

## 2021-05-19 ENCOUNTER — Other Ambulatory Visit (HOSPITAL_BASED_OUTPATIENT_CLINIC_OR_DEPARTMENT_OTHER): Payer: Self-pay

## 2021-06-01 NOTE — Progress Notes (Signed)
Cardiology Office Note:    Date:  06/03/2021   ID:  Sarah Stevenson, DOB [birth year], MRN [id number]  PCP:  Christain Sacramento, MD  Cardiologist:  Sinclair Grooms, MD   Referring MD: Christain Sacramento, MD   Chief Complaint  Patient presents with   Cardiac Valve Problem   Hypertension   Hyperlipidemia   Coronary Artery Disease    History of Present Illness:    Sarah Stevenson is a 65 y.o. female with a hx of CAD with prior RCA DES and severe diagonal #2 disease, and aortic stenosis treated with CABG and AVR (2019), lung cancer diagnosed during CABG, DM II, hyperlipidemia, and depression.   Sarah Stevenson  is doing well.  She works in the yard.  She mows the grass.  She denies dyspnea.  She is not having angina.  No episodes of tacky arrhythmia, orthopnea, PND, syncope, or neurological complaints.  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  She keeps them 3 days/week.  Other days she does work in her yard.  She feels overall as if she is doing quite well.  Past Medical History:  Diagnosis Date   Anginal pain ([place])    Aortic stenosis    Coronary artery disease    Depression    "mild" (08/06/2016)   Dyspnea    Hyperlipidemia    lung ca dx'd 05/2018   Murmur, cardiac    Myocardial infarction [facility]) 2016   Osteoarthritis    "left hip and knee" (08/06/2016)   Type II diabetes mellitus ([place])     Past Surgical History:  Procedure Laterality Date   AORTIC VALVE REPLACEMENT N/A 05/17/2018   Procedure: AORTIC VALVE REPLACEMENT (AVR) USING 21 MM MAGNA EASE PERICARDIAL BIOPROSTHESIS. MODEL # [id number]. SERIAL # [id number];  Surgeon: Grace Isaac, MD;  Location: [place];  Service: Open Heart Surgery;  Laterality: N/A;   CARDIAC CATHETERIZATION Right 11/30/2014   Procedure: RIGHT/LEFT HEART CATH AND CORONARY ANGIOGRAPHY;  Surgeon: Belva Crome, MD;  Location: [facility] CATH [facility];  Service: Cardiovascular;  Laterality: Right;   CARDIAC CATHETERIZATION N/A 12/13/2015    Procedure: Left Heart Cath and Coronary Angiography;  Surgeon: Leonie Man, MD;  Location: [place] CV [facility];  Service: Cardiovascular;  Laterality: N/A;   CARDIAC CATHETERIZATION N/A 12/13/2015   Procedure: Intravascular Pressure Wire/FFR Study;  Surgeon: Leonie Man, MD;  Location: [place] [facility];  Service: Cardiovascular;  Laterality: N/A;  RCA   CARDIAC CATHETERIZATION N/A 08/06/2016   Procedure: Right/Left Heart Cath and Coronary Angiography;  Surgeon: Lorretta Harp, MD;  Location: [place] CV LAB;  Service: Cardiovascular;  Laterality: N/A;   CARDIAC CATHETERIZATION N/A 08/06/2016   Procedure: Coronary Stent Intervention;  Surgeon: Lorretta Harp, MD;  Location: [place] [facility];  Service: Cardiovascular;  Laterality: N/A;   distal rca 3.0x16 and 3.0x8 synergy   [place] WITH STENT PLACEMENT  08/06/2016   "2 stents"   CORONARY ARTERY BYPASS GRAFT N/A 05/17/2018   Procedure: CORONARY ARTERY BYPASS GRAFTING (CABG) X 2 WITH ENDOSCOPIC HARVESTING OF RIGHT SAPHENOUS VEIN. LIMA TO LAD. SVG TO RCA;  Surgeon: Grace Isaac, MD;  Location: [place];  Service: Open Heart Surgery;  Laterality: N/A;   HYSTEROSCOPY DIAGNOSTIC  1990s   JOINT REPLACEMENT     KNEE ARTHROSCOPY Left 1996   NASAL SINUS SURGERY  1977   RIGHT/LEFT  HEART CATH AND CORONARY ANGIOGRAPHY N/A 04/01/2018   Procedure: RIGHT/LEFT HEART CATH AND CORONARY ANGIOGRAPHY;  Surgeon: Belva Crome, MD;  Location: [place] CV [facility];  Service: Cardiovascular;  Laterality: N/A;   TEE WITHOUT CARDIOVERSION N/A 05/17/2018   Procedure: TRANSESOPHAGEAL ECHOCARDIOGRAM (TEE);  Surgeon: Grace Isaac, MD;  Location: [facility];  Service: Open Heart Surgery;  Laterality: N/A;   TOTAL HIP ARTHROPLASTY Left 04/23/2017   Procedure: LEFT TOTAL HIP ARTHROPLASTY ANTERIOR APPROACH;  Surgeon: Mcarthur Rossetti, MD;  Location: [facility];  Service: Orthopedics;  Laterality: Left;   WEDGE  RESECTION Left 05/17/2018   Procedure: LEFT UPPER LOBE LUNG WEDGE RESECTION;  Surgeon: Grace Isaac, MD;  Location: [place];  Service: Open Heart Surgery;  Laterality: Left;    Current Medications: Current Meds  Medication Sig   acetaminophen (TYLENOL) 500 MG tablet Take 500-1,000 mg by mouth 2 (two) times daily as needed for moderate pain.   amoxicillin-clavulanate (AUGMENTIN) 875-125 MG tablet Take 1 tablet by mouth twice a day for 14 days   carvedilol (COREG) 6.25 MG tablet TAKE 1 TABLET (6.25 MG TOTAL) BY MOUTH 2 (TWO) TIMES DAILY WITH A MEAL.   celecoxib (CELEBREX) 200 MG capsule Take one tablet daily as needed for leg pain   cetirizine (ZYRTEC) 10 MG tablet Take 10 mg by mouth daily.   COVID-19 mRNA Vac-TriS, Pfizer, SUSP injection Inject into the muscle.   Dulaglutide (TRULICITY) 9.83 JA/2.5KN SOPN Inject 0.5 mLs (0.75 mg total) into the skin every 7 days.   Evolocumab (REPATHA SURECLICK) 397 MG/ML SOAJ INJECT 140 MG INTO THE SKIN EVERY 14 DAYS   famotidine (PEPCID) 20 MG tablet Take 20 mg by mouth at bedtime.   fexofenadine (ALLEGRA) 180 MG tablet Take 1 tablet (180 mg total) by mouth daily.   fluconazole (DIFLUCAN) 150 MG tablet TAKE 1 TABLET BY MOUTH ONCE FOR 1 DOSE. MAY TAKE A 2ND TABLET IF NO IMPROVEMENT AFTER 72 HOURS   fluticasone (FLONASE) 50 MCG/ACT nasal spray Place 1 spray into both nostrils daily as needed for allergies or rhinitis.   glipiZIDE (GLUCOTROL XL) 10 MG 24 hr tablet TAKE 1 TABLET BY MOUTH DAILY FOR DIABETES CONTROL.   glucose blood (ONETOUCH ULTRA) test strip TEST BLOOD SUGAR ONCE A DAY   losartan (COZAAR) 50 MG tablet TAKE 1 TABLET BY MOUTH DAILY TO PRESERVE KIDNEY FUNCTION.   Menthol-Methyl Salicylate (MUSCLE RUB EX) Apply 1 application topically as needed (for pain).   metFORMIN (GLUCOPHAGE) 850 MG tablet TAKE 1 TABLET BY MOUTH TWICE DAILY TO CONTROL DIABETES   methocarbamol (ROBAXIN) 500 MG tablet Take one tablet 4 times a day as needed for muscle    montelukast (SINGULAIR) 10 MG tablet Take 1 tablet by mouth daily for allergy control   prasugrel (EFFIENT) 10 MG TABS tablet Take 1 tablet (10 mg total) by mouth daily.   predniSONE (DELTASONE) 20 MG tablet Take 3 tablets by mouth daily for 5 days   sertraline (ZOLOFT) 50 MG tablet Take 25 mg by mouth daily.    Sod Picosulfate-Mag Ox-Cit Acd 10-3.5-12 MG-GM -GM/160ML SOLN TAKE AS DIRECTED BY MOUTH   triamcinolone cream (KENALOG) 0.1 % Apply to rash up to 3 times a day as needed   TRULICITY 6.73 AL/9.3XT SOPN Inject 0.75 mg into the skin once a week.      Allergies:   Brilinta [ticagrelor], Lisinopril, Asa [aspirin], Blue dyes (parenteral), Corn-containing products, Nsaids, Sugar-protein-starch, Tea, Rosuvastatin, Zetia [ezetimibe], Isosulfan blue, and Other   Social  History   Socioeconomic History   Marital status: Divorced    Spouse name: Not on file   Number of children: Not on file   Years of education: Not on file   Highest education level: Bachelor's degree (e.g., BA, AB, BS)  Occupational History   Not on file  Tobacco Use   Smoking status: Former    Packs/day: 1.00    Years: 10.00    Pack years: 10.00    Types: Cigarettes    Quit date: 76    Years since quitting: 40.7   Smokeless tobacco: Never   Tobacco comments:    restarted  about 5-6 yrs ago, then stopped  Vaping Use   Vaping Use: Former  Substance and Sexual Activity   Alcohol use: Not Currently    Comment: occansionally   Drug use: No   Sexual activity: Never  Other Topics Concern   Not on file  Social History Narrative   Not on file   Social Determinants of Health   Financial Resource Strain: Not on file  Food Insecurity: Not on file  Transportation Needs: Not on file  Physical Activity: Not on file  Stress: Not on file  Social Connections: Not on file     Family History: The patient's family history includes Breast cancer in her maternal aunt; Dementia in her mother; Heart attack in her father  and mother; Sudden death in her father; Thyroid disease in her mother. There is no history of Hypertension, Hyperlipidemia, or Diabetes.  ROS:   Please see the history of present illness.    She stopped taking spironolactone approximately 1 year ago.  She was concerned that her potassium was always be relatively elevated.  She is on Repatha.  It is being supplied by Dr. Kathryne Eriksson.  Her last LDL was less than 70.  She has some burning chest discomfort earlier in the year.  She resumed taking Pepcid and it resolved.  The discomfort was not exertional.  All other systems reviewed and are negative.  EKGs/Labs/Other Studies Reviewed:    The following studies were reviewed today:  2 D Doppler ECHOCARDIOGRAM 05/22/2019: IMPRESSIONS     1. The left ventricle has normal systolic function with an ejection  fraction of 60-65%. The cavity size was normal. There is mild concentric  left ventricular hypertrophy. Left ventricular diastolic Doppler  parameters are consistent with  pseudonormalization. Elevated left atrial and left ventricular  end-diastolic pressures The E/e' is 16. No evidence of left ventricular  regional wall motion abnormalities.   2. Left atrial size was mildly dilated.   3. Mild thickening of the mitral valve leaflet. There is moderate mitral  annular calcification present. No evidence of mitral valve stenosis.   4. A 47mm pericardial bioprosthesis valve is present in the aortic  position. Procedure Date: 05/17/2018 Normal aortic valve prosthesis.   5. Unchanged from prior of the aortic valve.   6. The aorta is normal unless otherwise noted.   7. The aortic root, ascending aorta and aortic arch are normal in size  and structure.   EKG:  EKG normal sinus rhythm, left anterior hemiblock, biatrial abnormality.  When compared to the prior tracing performed on May 31, 2020, axis deviation is new.  This is related to left anterior hemiblock.  PR interval is also  short.  Recent Labs: 07/15/2020: ALT 14; BUN 15; Creatinine 1.08; Hemoglobin 13.2; Platelet Count 224; Potassium 4.5; Sodium 136  Recent Lipid Panel    Component Value Date/Time  CHOL 95 (L) 06/05/2019 1212   TRIG 151 (H) 06/05/2019 1212   HDL 27 (L) 06/05/2019 1212   CHOLHDL 3.5 06/05/2019 1212   CHOLHDL 4.8 12/01/2014 0425   VLDL 24 12/01/2014 0425   LDLCALC 42 06/05/2019 1212    Physical Exam:    VS:  BP (!) 124/58   Pulse 70   Ht 5\' 2"  (1.575 m)   Wt 196 lb 12.8 oz (89.3 kg)   SpO2 98%   BMI 36.00 kg/m     Wt Readings from Last 3 Encounters:  06/03/21 196 lb 12.8 oz (89.3 kg)  04/14/21 194 lb (88 kg)  10/17/20 198 lb (89.8 kg)     GEN: Overweight. No acute distress HEENT: Normal NECK: No JVD. LYMPHATICS: No lymphadenopathy CARDIAC: 2-3 over 6 right upper sternal systolic without diastolic murmur. RRR S4 gallop, or edema. VASCULAR:  Normal Pulses. No bruits. RESPIRATORY:  Clear to auscultation without rales, wheezing or rhonchi  ABDOMEN: Soft, non-tender, non-distended, No pulsatile mass, MUSCULOSKELETAL: No deformity  SKIN: Warm and dry NEUROLOGIC:  Alert and oriented x 3 PSYCHIATRIC:  Normal affect   ASSESSMENT:    1. Coronary artery disease involving coronary bypass graft of native heart with angina pectoris ([place])   2. H/O aortic valve replacement   3. Chronic diastolic heart failure ([place])   4. Mixed hyperlipidemia   5. Type 2 diabetes mellitus with complication (H[facility])    PLAN:    In order of problems listed above:  Prevention discussed.  Continue to achieve greater than 150 minutes of moderate activity per week. 2D Doppler echocardiogram should be done next year either prior to or after the office visit. Consider adding an SGLT2 such as [place] or [place].  I asked her to mention this to Dr. Kathryne Eriksson. Continue PCSK9 therapy Most recent hemoglobin A1c of 7.2.  She is on Ozempic.  If additional help at diabetes control is needed, it should be with  an SGLT2 which would have crossover protective effects on her cardiovascular system and diastolic heart failure.   Medication Adjustments/Labs and Tests Ordered: Current medicines are reviewed at length with the patient today.  Concerns regarding medicines are outlined above.  Orders Placed This Encounter  Procedures   EKG 12-Lead   No orders of the defined types were placed in this encounter.   There are no Patient Instructions on file for this visit.   Signed, Sinclair Grooms, MD  06/03/2021 4:23 PM    [facility]

## 2021-06-02 ENCOUNTER — Ambulatory Visit
Admission: RE | Admit: 2021-06-02 | Discharge: 2021-06-02 | Disposition: A | Discharge status: Home / Self Care | Payer: 59 | Source: Ambulatory Visit | Attending: Otolaryngology | Admitting: Otolaryngology

## 2021-06-02 DIAGNOSIS — J329 Chronic sinusitis, unspecified: Secondary | ICD-10-CM

## 2021-06-03 ENCOUNTER — Encounter: Payer: Self-pay | Admitting: Interventional Cardiology

## 2021-06-03 ENCOUNTER — Ambulatory Visit (INDEPENDENT_AMBULATORY_CARE_PROVIDER_SITE_OTHER): Payer: 59 | Admitting: Interventional Cardiology

## 2021-06-03 ENCOUNTER — Other Ambulatory Visit: Payer: Self-pay

## 2021-06-03 VITALS — BP 124/58 | HR 70 | Ht 62.0 in | Wt 196.8 lb

## 2021-06-03 DIAGNOSIS — I5032 Chronic diastolic (congestive) heart failure: Secondary | ICD-10-CM

## 2021-06-03 DIAGNOSIS — I25709 Atherosclerosis of coronary artery bypass graft(s), unspecified, with unspecified angina pectoris: Secondary | ICD-10-CM

## 2021-06-03 DIAGNOSIS — Z952 Presence of prosthetic heart valve: Secondary | ICD-10-CM

## 2021-06-03 DIAGNOSIS — E118 Type 2 diabetes mellitus with unspecified complications: Secondary | ICD-10-CM

## 2021-06-03 DIAGNOSIS — E782 Mixed hyperlipidemia: Secondary | ICD-10-CM | POA: Diagnosis not present

## 2021-06-03 NOTE — Patient Instructions (Signed)

## 2021-06-18 ENCOUNTER — Telehealth: Payer: Self-pay | Admitting: *Deleted

## 2021-06-18 NOTE — Telephone Encounter (Signed)
   [facility] Pre-operative Risk Assessment    Patient Name: Sarah Stevenson  DOB: Jan 21, 1956 MRN: [id number]  HEARTCARE STAFF:  - IMPORTANT!!!!!! Under Visit Info/Reason for Call, type in Other and utilize the format Clearance MM/DD/YY or Clearance TBD. Do not use dashes or single digits. - Please review there is not already an duplicate clearance open for this procedure. - If request is for dental extraction, please clarify the # of teeth to be extracted. - If the patient is currently at the dentist's office, call Pre-Op Callback Staff (MA/nurse) to input urgent request.  - If the patient is not currently in the dentist office, please route to the Pre-Op pool.  Request for surgical clearance:  What type of surgery is being performed?  SINUS SURGERY  When is this surgery scheduled?  07/11/2021  What type of clearance is required (medical clearance vs. Pharmacy clearance to hold med vs. Both)?  MEIDCAL  Are there any medications that need to be held prior to surgery and how long?  N/A  Practice name and name of physician performing surgery?  [facility] Bring, MD, PA  What is the office phone number?  [phone]   7.   What is the office fax number?  [phone]  8.   Anesthesia type (None, local, MAC, general) ?  GENERAL   Jeanann Lewandowsky 06/18/2021, 3:29 PM  _________________________________________________________________   (provider comments below)

## 2021-06-19 ENCOUNTER — Other Ambulatory Visit: Payer: Self-pay | Admitting: Otolaryngology

## 2021-06-19 NOTE — Telephone Encounter (Signed)
   Primary Cardiologist: Sinclair Grooms, MD  Chart reviewed as part of pre-operative protocol coverage. Given past medical history and time since last visit, based on ACC/AHA guidelines, ZERIYAH WAIN would be at acceptable risk for the planned procedure without further cardiovascular testing.    I will route this recommendation to the requesting party via Epic fax function and remove from pre-op pool.  Please call with questions.  Jossie Ng. Kaydn Kumpf NP-C    06/19/2021, 10:19 AM [place] [address] Office [phone] Fax [phone]

## 2021-07-03 ENCOUNTER — Other Ambulatory Visit (HOSPITAL_BASED_OUTPATIENT_CLINIC_OR_DEPARTMENT_OTHER): Payer: Self-pay

## 2021-07-03 ENCOUNTER — Other Ambulatory Visit: Payer: Self-pay | Admitting: Interventional Cardiology

## 2021-07-03 MED ORDER — PRASUGREL HCL 10 MG PO TABS
10.0000 mg | ORAL_TABLET | Freq: Every day | ORAL | 0 refills | Status: DC
  Filled 2021-07-03: qty 90, 90d supply, fill #0

## 2021-07-03 MED ORDER — SERTRALINE HCL 50 MG PO TABS
ORAL_TABLET | ORAL | 3 refills | Status: DC
  Filled 2021-07-03: qty 90, 90d supply, fill #0
  Filled 2021-10-14: qty 90, 90d supply, fill #1
  Filled 2021-11-11 – 2022-01-15 (×2): qty 90, 90d supply, fill #2
  Filled 2022-04-21: qty 90, 90d supply, fill #3

## 2021-07-04 ENCOUNTER — Other Ambulatory Visit (HOSPITAL_BASED_OUTPATIENT_CLINIC_OR_DEPARTMENT_OTHER): Payer: Self-pay

## 2021-07-07 ENCOUNTER — Other Ambulatory Visit (HOSPITAL_BASED_OUTPATIENT_CLINIC_OR_DEPARTMENT_OTHER): Payer: Self-pay

## 2021-07-08 ENCOUNTER — Other Ambulatory Visit (HOSPITAL_BASED_OUTPATIENT_CLINIC_OR_DEPARTMENT_OTHER): Payer: Self-pay

## 2021-07-14 ENCOUNTER — Ambulatory Visit (HOSPITAL_COMMUNITY)
Admission: RE | Admit: 2021-07-14 | Discharge: 2021-07-14 | Disposition: A | Discharge status: Home / Self Care | Payer: PPO | Source: Ambulatory Visit | Attending: Internal Medicine | Admitting: Internal Medicine

## 2021-07-14 ENCOUNTER — Other Ambulatory Visit: Payer: Self-pay

## 2021-07-14 ENCOUNTER — Inpatient Hospital Stay: Payer: PPO | Attending: Internal Medicine

## 2021-07-14 DIAGNOSIS — I700 Atherosclerosis of aorta: Secondary | ICD-10-CM | POA: Diagnosis not present

## 2021-07-14 DIAGNOSIS — C3412 Malignant neoplasm of upper lobe, left bronchus or lung: Secondary | ICD-10-CM | POA: Insufficient documentation

## 2021-07-14 DIAGNOSIS — C3490 Malignant neoplasm of unspecified part of unspecified bronchus or lung: Secondary | ICD-10-CM | POA: Insufficient documentation

## 2021-07-14 DIAGNOSIS — R911 Solitary pulmonary nodule: Secondary | ICD-10-CM | POA: Diagnosis not present

## 2021-07-14 LAB — CBC WITH DIFFERENTIAL (CANCER CENTER ONLY)
Abs Immature Granulocytes: 0.02 10*3/uL (ref 0.00–0.07)
Basophils Absolute: 0.1 10*3/uL (ref 0.0–0.1)
Basophils Relative: 1 %
Eosinophils Absolute: 0.3 10*3/uL (ref 0.0–0.5)
Eosinophils Relative: 3 %
HCT: 39.7 % (ref 36.0–46.0)
Hemoglobin: 13.2 g/dL (ref 12.0–15.0)
Immature Granulocytes: 0 %
Lymphocytes Relative: 37 %
Lymphs Abs: 3.6 10*3/uL (ref 0.7–4.0)
MCH: 29.5 pg (ref 26.0–34.0)
MCHC: 33.2 g/dL (ref 30.0–36.0)
MCV: 88.8 fL (ref 80.0–100.0)
Monocytes Absolute: 0.8 10*3/uL (ref 0.1–1.0)
Monocytes Relative: 8 %
Neutro Abs: 5 10*3/uL (ref 1.7–7.7)
Neutrophils Relative %: 51 %
Platelet Count: 214 10*3/uL (ref 150–400)
RBC: 4.47 MIL/uL (ref 3.87–5.11)
RDW: 13.1 % (ref 11.5–15.5)
WBC Count: 9.8 10*3/uL (ref 4.0–10.5)
nRBC: 0 % (ref 0.0–0.2)

## 2021-07-14 LAB — CMP (CANCER CENTER ONLY)
ALT: 12 U/L (ref 0–44)
AST: 11 U/L — ABNORMAL LOW (ref 15–41)
Albumin: 3.8 g/dL (ref 3.5–5.0)
Alkaline Phosphatase: 85 U/L (ref 38–126)
Anion gap: 7 (ref 5–15)
BUN: 14 mg/dL (ref 8–23)
CO2: 25 mmol/L (ref 22–32)
Calcium: 8.7 mg/dL — ABNORMAL LOW (ref 8.9–10.3)
Chloride: 104 mmol/L (ref 98–111)
Creatinine: 0.86 mg/dL (ref 0.44–1.00)
GFR, Estimated: >60 mL/min (ref >60)
Glucose, Bld: 95 mg/dL (ref 70–99)
Potassium: 4.4 mmol/L (ref 3.5–5.1)
Sodium: 136 mmol/L (ref 135–145)
Total Bilirubin: 0.4 mg/dL (ref 0.3–1.2)
Total Protein: 7.2 g/dL (ref 6.5–8.1)

## 2021-07-14 MED ORDER — IOHEXOL 350 MG/ML SOLN
60.0000 mL | Freq: Once | INTRAVENOUS | Status: AC | PRN
  Administered 2021-07-14: 60 mL via INTRAVENOUS

## 2021-07-16 ENCOUNTER — Inpatient Hospital Stay (HOSPITAL_BASED_OUTPATIENT_CLINIC_OR_DEPARTMENT_OTHER): Payer: PPO | Admitting: Internal Medicine

## 2021-07-16 ENCOUNTER — Other Ambulatory Visit: Payer: Self-pay

## 2021-07-16 VITALS — BP 156/62 | HR 78 | Temp 97.6°F | Resp 20 | Ht 62.0 in | Wt 198.4 lb

## 2021-07-16 DIAGNOSIS — C3412 Malignant neoplasm of upper lobe, left bronchus or lung: Secondary | ICD-10-CM | POA: Insufficient documentation

## 2021-07-16 DIAGNOSIS — C3492 Malignant neoplasm of unspecified part of left bronchus or lung: Secondary | ICD-10-CM | POA: Diagnosis not present

## 2021-07-16 NOTE — Progress Notes (Signed)
[place] [phone]   [phone]) [phone]  OFFICE PROGRESS NOTE  Christain Sacramento, MD [address] [place] [address]  DIAGNOSIS: Stage IA (T1 a, Nx, Mx) non-small cell lung cancer, adenocarcinoma measuring 1.2 cm.  PRIOR THERAPY: Status post wedge resection of the left upper lobe on May 24, 2018 under the care of Dr. Servando Snare.  CURRENT THERAPY: Observation.  INTERVAL HISTORY: Sarah Stevenson 65 y.o. female returns to the clinic today for annual follow-up visit.  The patient is feeling fine today with no concerning complaints except for postnasal drainage.  She denied having any chest pain but has mild cough with no shortness of breath or hemoptysis.  She has no nausea, vomiting, diarrhea or constipation.  She has no headache or visual changes.  She has no significant weight loss or night sweats.  She is here today for evaluation with repeat CT scan of the chest for restaging of her disease.  MEDICAL HISTORY: Past Medical History:  Diagnosis Date   Anginal pain ([place])    Aortic stenosis    Coronary artery disease    Depression    "mild" (08/06/2016)   Dyspnea    Hyperlipidemia    lung ca dx'd 05/2018   Murmur, cardiac    Myocardial infarction [facility]) 2016   Osteoarthritis    "left hip and knee" (08/06/2016)   Type II diabetes mellitus (H[facility])     ALLERGIES:  is allergic to brilinta [ticagrelor], lisinopril, asa [aspirin], blue dyes (parenteral), corn-containing products, nsaids, sugar-protein-starch, tea, rosuvastatin, zetia [ezetimibe], isosulfan blue, and other.  MEDICATIONS:  Current Outpatient Medications  Medication Sig Dispense Refill   acetaminophen (TYLENOL) 500 MG tablet Take 500-1,000 mg by mouth 2 (two) times daily as needed for moderate pain.     amoxicillin-clavulanate (AUGMENTIN) 875-125 MG tablet Take 1 tablet by mouth twice a day for 14 days 28 tablet 0   carvedilol (COREG) 6.25 MG tablet TAKE 1 TABLET (6.25 MG  TOTAL) BY MOUTH 2 (TWO) TIMES DAILY WITH A MEAL. 180 tablet 1   celecoxib (CELEBREX) 200 MG capsule Take one tablet daily as needed for leg pain 90 capsule 3   cetirizine (ZYRTEC) 10 MG tablet Take 10 mg by mouth daily.     COVID-19 mRNA Vac-TriS, Pfizer, SUSP injection Inject into the muscle. 0.3 mL 0   Dulaglutide (TRULICITY) 5.18 AC/1.6SA SOPN Inject 0.5 mLs (0.75 mg total) into the skin every 7 days. 6 mL 3   Evolocumab (REPATHA SURECLICK) 630 MG/ML SOAJ INJECT 140 MG INTO THE SKIN EVERY 14 DAYS 6 mL 3   famotidine (PEPCID) 20 MG tablet Take 20 mg by mouth at bedtime.     fexofenadine (ALLEGRA) 180 MG tablet Take 1 tablet (180 mg total) by mouth daily. 30 tablet 0   fluconazole (DIFLUCAN) 150 MG tablet TAKE 1 TABLET BY MOUTH ONCE FOR 1 DOSE. MAY TAKE A 2ND TABLET IF NO IMPROVEMENT AFTER 72 HOURS 2 tablet 0   fluticasone (FLONASE) 50 MCG/ACT nasal spray Place 1 spray into both nostrils daily as needed for allergies or rhinitis.     glipiZIDE (GLUCOTROL XL) 10 MG 24 hr tablet TAKE 1 TABLET BY MOUTH DAILY FOR DIABETES CONTROL. 90 tablet 3   glucose blood (ONETOUCH ULTRA) test strip TEST BLOOD SUGAR ONCE A DAY     losartan (COZAAR) 50 MG tablet TAKE 1 TABLET BY MOUTH DAILY TO PRESERVE KIDNEY FUNCTION. 90 tablet 3   Menthol-Methyl Salicylate (MUSCLE RUB  EX) Apply 1 application topically as needed (for pain).     metFORMIN (GLUCOPHAGE) 850 MG tablet TAKE 1 TABLET BY MOUTH TWICE DAILY TO CONTROL DIABETES 180 tablet 0   methocarbamol (ROBAXIN) 500 MG tablet Take one tablet 4 times a day as needed for muscle 40 tablet 2   montelukast (SINGULAIR) 10 MG tablet Take 1 tablet by mouth daily for allergy control 30 tablet 5   prasugrel (EFFIENT) 10 MG TABS tablet Take 1 tablet (10 mg total) by mouth daily. 90 tablet 0   predniSONE (DELTASONE) 20 MG tablet Take 3 tablets by mouth daily for 5 days 15 tablet 0   sertraline (ZOLOFT) 50 MG tablet Take 25 mg by mouth daily.      sertraline (ZOLOFT) 50 MG tablet  TAKE 1 TABLET BY MOUTH ONCE DAILY TO CONTROL ANXIETY 90 tablet 3   triamcinolone cream (KENALOG) 0.1 % Apply to rash up to 3 times a day as needed 80 g 2   TRULICITY 3.66 YQ/0.3KV SOPN Inject 0.75 mg into the skin once a week.   3   No current facility-administered medications for this visit.    SURGICAL HISTORY:  Past Surgical History:  Procedure Laterality Date   AORTIC VALVE REPLACEMENT N/A 05/17/2018   Procedure: AORTIC VALVE REPLACEMENT (AVR) USING 21 MM MAGNA EASE PERICARDIAL BIOPROSTHESIS. MODEL # [id number]. SERIAL # [id number];  Surgeon: Grace Isaac, MD;  Location: [facility];  Service: Open Heart Surgery;  Laterality: N/A;   CARDIAC CATHETERIZATION Right 11/30/2014   Procedure: RIGHT/LEFT HEART CATH AND CORONARY ANGIOGRAPHY;  Surgeon: Belva Crome, MD;  Location: [facility] CATH LAB;  Service: Cardiovascular;  Laterality: Right;   CARDIAC CATHETERIZATION N/A 12/13/2015   Procedure: Left Heart Cath and Coronary Angiography;  Surgeon: Leonie Man, MD;  Location: [facility] CV LAB;  Service: Cardiovascular;  Laterality: N/A;   CARDIAC CATHETERIZATION N/A 12/13/2015   Procedure: Intravascular Pressure Wire/FFR Study;  Surgeon: Leonie Man, MD;  Location: [place] CV LAB;  Service: Cardiovascular;  Laterality: N/A;  RCA   CARDIAC CATHETERIZATION N/A 08/06/2016   Procedure: Right/Left Heart Cath and Coronary Angiography;  Surgeon: Lorretta Harp, MD;  Location: [place] CV LAB;  Service: Cardiovascular;  Laterality: N/A;   CARDIAC CATHETERIZATION N/A 08/06/2016   Procedure: Coronary Stent Intervention;  Surgeon: Lorretta Harp, MD;  Location: [place] CV LAB;  Service: Cardiovascular;  Laterality: N/A;   distal rca 3.0x16 and 3.0x8 synergy   [place] WITH STENT PLACEMENT  08/06/2016   "2 stents"   CORONARY ARTERY BYPASS GRAFT N/A 05/17/2018   Procedure: CORONARY ARTERY BYPASS GRAFTING (CABG) X 2 WITH ENDOSCOPIC HARVESTING OF RIGHT  SAPHENOUS VEIN. LIMA TO LAD. SVG TO RCA;  Surgeon: Grace Isaac, MD;  Location: [facility];  Service: Open Heart Surgery;  Laterality: N/A;   HYSTEROSCOPY DIAGNOSTIC  1990s   JOINT REPLACEMENT     KNEE ARTHROSCOPY Left 1996   NASAL SINUS SURGERY  1977   RIGHT/LEFT HEART CATH AND CORONARY ANGIOGRAPHY N/A 04/01/2018   Procedure: RIGHT/LEFT HEART CATH AND CORONARY ANGIOGRAPHY;  Surgeon: Belva Crome, MD;  Location: [facility] CV LAB;  Service: Cardiovascular;  Laterality: N/A;   TEE WITHOUT CARDIOVERSION N/A 05/17/2018   Procedure: TRANSESOPHAGEAL ECHOCARDIOGRAM (TEE);  Surgeon: Grace Isaac, MD;  Location: [facility];  Service: Open Heart Surgery;  Laterality: N/A;   TOTAL HIP ARTHROPLASTY Left 04/23/2017   Procedure: LEFT TOTAL HIP ARTHROPLASTY ANTERIOR APPROACH;  Surgeon: Mcarthur Rossetti, MD;  Location: [facility];  Service: Orthopedics;  Laterality: Left;   WEDGE RESECTION Left 05/17/2018   Procedure: LEFT UPPER LOBE LUNG WEDGE RESECTION;  Surgeon: Grace Isaac, MD;  Location: [place];  Service: Open Heart Surgery;  Laterality: Left;    REVIEW OF SYSTEMS:  A comprehensive review of systems was negative except for: Respiratory: positive for cough   PHYSICAL EXAMINATION: General appearance: alert, cooperative, and no distress Head: Normocephalic, without obvious abnormality, atraumatic Neck: no adenopathy, no JVD, supple, symmetrical, trachea midline, and thyroid not enlarged, symmetric, no tenderness/mass/nodules Lymph nodes: Cervical, supraclavicular, and axillary nodes normal. Resp: clear to auscultation bilaterally Back: symmetric, no curvature. ROM normal. No CVA tenderness. Cardio: regular rate and rhythm, S1, S2 normal, no murmur, click, rub or gallop GI: soft, non-tender; bowel sounds normal; no masses,  no organomegaly Extremities: extremities normal, atraumatic, no cyanosis or edema  ECOG PERFORMANCE STATUS: 1 - Symptomatic but completely ambulatory  Blood pressure  (!) 156/62, pulse 78, temperature 97.6 F (36.4 C), temperature source Tympanic, resp. rate 20, height 5\' 2"  (1.575 m), weight 198 lb 6.4 oz (90 kg), SpO2 98 %.  LABORATORY DATA: Lab Results  Component Value Date   WBC 9.8 07/14/2021   HGB 13.2 07/14/2021   HCT 39.7 07/14/2021   MCV 88.8 07/14/2021   PLT 214 07/14/2021      Chemistry      Component Value Date/Time   NA 136 07/14/2021 1458   NA 136 10/17/2019 0914   K 4.4 07/14/2021 1458   CL 104 07/14/2021 1458   CO2 25 07/14/2021 1458   BUN 14 07/14/2021 1458   BUN 19 10/17/2019 0914   CREATININE 0.86 07/14/2021 1458      Component Value Date/Time   CALCIUM 8.7 (L) 07/14/2021 1458   ALKPHOS 85 07/14/2021 1458   AST 11 (L) 07/14/2021 1458   ALT 12 07/14/2021 1458   BILITOT 0.4 07/14/2021 1458       RADIOGRAPHIC STUDIES: CT Chest W Contrast  Result Date: 07/15/2021 CLINICAL DATA:  Primary Cancer Type: Lung Imaging Indication: Routine surveillance Interval therapy since last imaging? No Initial Cancer Diagnosis Date: 05/17/2018; Established by: Biopsy-proven Detailed Pathology: Stage IA non-small cell lung cancer, adenocarcinoma Primary Tumor location:  Left upper lobe. Surgeries: Left upper lobe wedge resection 05/17/2018 with CABG and aortic valve replacement. Chemotherapy: No Immunotherapy? No Radiation therapy? No EXAM: CT CHEST WITH CONTRAST TECHNIQUE: Multidetector CT imaging of the chest was performed during intravenous contrast administration. CONTRAST:  58mL OMNIPAQUE IOHEXOL 350 MG/ML SOLN COMPARISON:  Most recent CT chest 07/15/2020.  06/29/2018 PET-CT. FINDINGS: Cardiovascular: Heart size is normal. There is no significant pericardial fluid, thickening or pericardial calcification. There is aortic atherosclerosis, as well as atherosclerosis of the great vessels of the mediastinum and the coronary arteries, including calcified atherosclerotic plaque in the left main, left anterior descending, left circumflex and right  coronary arteries. Status post median sternotomy for CABG and aortic valve replacement (a stented bioprosthesis is noted). Mediastinum/Nodes: No pathologically enlarged mediastinal or hilar lymph nodes. Esophagus is unremarkable in appearance. No axillary lymphadenopathy. Lungs/Pleura: Postoperative changes of left upper lobe wedge resection are again noted. There is no soft tissue nodularity along the suture line to suggest locally recurrent disease. Mild architectural distortion in the left upper lobe, stable compared to prior examinations, compatible with mild postoperative scarring. 4 mm subpleural nodule in the anterior aspect of the right middle lobe (axial image 82 of series 5), stable on  numerous prior examinations dating back to at least 04/15/2018, considered benign (presumably a subpleural lymph node). No other new suspicious appearing pulmonary nodules or masses are noted. No acute consolidative airspace disease. No pleural effusions. Upper Abdomen: Diffuse low attenuation throughout the visualized hepatic parenchyma, indicative of hepatic steatosis. Aortic atherosclerosis. Multifocal cortical scarring in the kidneys bilaterally (left greater than right). Musculoskeletal: Median sternotomy wires. There are no aggressive appearing lytic or blastic lesions noted in the visualized portions of the skeleton. IMPRESSION: 1. Stable postoperative changes of prior left upper lobe wedge resection with no findings to suggest local recurrence of disease or metastatic disease in the lungs. 2. Aortic atherosclerosis, in addition to left main and 3 vessel coronary artery disease. Status post median sternotomy for CABG as well as aortic valve replacement. 3. Hepatic steatosis. Electronically Signed   By: Vinnie Langton M.D.   On: 07/15/2021 10:49     ASSESSMENT AND PLAN: This is a very pleasant 65 years old white female diagnosed with a stage Ia non-small cell lung cancer status post wedge resection of the left  upper lobe in September 2019 under the care of Dr. Servando Snare. The patient is currently on observation and she is feeling fine today with no concerning complaints. She had repeat CT scan of the chest performed recently.  I personally and independently reviewed the scans and discussed the results with the patient today. Her scan showed no concerning findings for disease recurrence or metastasis. I recommended for her to continue on observation with repeat CT scan of the chest in 1 year. Regarding the coronary artery disease, she will follow up with her cardiologist Dr. Tamala Julian for any further recommendation. The patient was advised to call immediately if she has any other concerning symptoms in the interval. The patient voices understanding of current disease status and treatment options and is in agreement with the current care plan. All questions were answered. The patient knows to call the clinic with any problems, questions or concerns. We can certainly see the patient much sooner if necessary.   Disclaimer: This note was dictated with voice recognition software. Similar sounding words can inadvertently be transcribed and may not be corrected upon review.

## 2021-07-22 ENCOUNTER — Encounter (HOSPITAL_BASED_OUTPATIENT_CLINIC_OR_DEPARTMENT_OTHER): Payer: Self-pay | Admitting: Otolaryngology

## 2021-07-22 ENCOUNTER — Other Ambulatory Visit: Payer: Self-pay

## 2021-07-22 NOTE — Telephone Encounter (Signed)
Received a message from [facility], asking if pt can hold the Effient for upcoming procedure, and if so, for how long.  It wasn't mentioned in the clearance below.  Please advise.

## 2021-07-22 NOTE — Telephone Encounter (Addendum)
Notes faxed to surgeon. This phone note will be removed from the preop pool.  Pt recently sent MyChart message to Dr. Tamala Julian regarding the same issue.  Will fwd phone note to him as an FYI. Richardson Dopp, PA-C  07/22/2021 12:25 PM

## 2021-07-22 NOTE — Telephone Encounter (Signed)
   Name: Sarah Stevenson DOB: 1955/10/07  MRN: [id number]  Primary Cardiologist: Sinclair Grooms, MD  Received request regarding holding antiplatelet Rx.  Chart reviewed.  This has been held in the past for other procedures.   The patient has not had any recent PCI procedures.  Recommendations: Prasugrel (Effient) can be held for 5-7 days prior to her procedure and resumed as soon as possible when felt to be safe from a bleeding perspective.     Please call with questions. Richardson Dopp, PA-C 07/22/2021, 12:19 PM

## 2021-07-28 ENCOUNTER — Encounter (HOSPITAL_BASED_OUTPATIENT_CLINIC_OR_DEPARTMENT_OTHER)
Admission: RE | Disposition: A | Discharge status: Home / Self Care | Payer: Self-pay | Source: Home / Self Care | Attending: Otolaryngology

## 2021-07-28 ENCOUNTER — Encounter (HOSPITAL_BASED_OUTPATIENT_CLINIC_OR_DEPARTMENT_OTHER): Payer: Self-pay | Admitting: Otolaryngology

## 2021-07-28 ENCOUNTER — Ambulatory Visit (HOSPITAL_BASED_OUTPATIENT_CLINIC_OR_DEPARTMENT_OTHER): Payer: PPO | Admitting: Certified Registered"

## 2021-07-28 ENCOUNTER — Ambulatory Visit (HOSPITAL_BASED_OUTPATIENT_CLINIC_OR_DEPARTMENT_OTHER)
Admission: RE | Admit: 2021-07-28 | Discharge: 2021-07-28 | Disposition: A | Discharge status: Home / Self Care | Payer: PPO | Attending: Otolaryngology | Admitting: Otolaryngology

## 2021-07-28 ENCOUNTER — Other Ambulatory Visit (HOSPITAL_BASED_OUTPATIENT_CLINIC_OR_DEPARTMENT_OTHER): Payer: Self-pay

## 2021-07-28 ENCOUNTER — Other Ambulatory Visit: Payer: Self-pay

## 2021-07-28 DIAGNOSIS — E119 Type 2 diabetes mellitus without complications: Secondary | ICD-10-CM | POA: Insufficient documentation

## 2021-07-28 DIAGNOSIS — Z952 Presence of prosthetic heart valve: Secondary | ICD-10-CM | POA: Insufficient documentation

## 2021-07-28 DIAGNOSIS — J320 Chronic maxillary sinusitis: Secondary | ICD-10-CM | POA: Diagnosis not present

## 2021-07-28 DIAGNOSIS — J338 Other polyp of sinus: Secondary | ICD-10-CM | POA: Diagnosis not present

## 2021-07-28 DIAGNOSIS — J328 Other chronic sinusitis: Secondary | ICD-10-CM | POA: Diagnosis not present

## 2021-07-28 DIAGNOSIS — J321 Chronic frontal sinusitis: Secondary | ICD-10-CM | POA: Insufficient documentation

## 2021-07-28 DIAGNOSIS — J329 Chronic sinusitis, unspecified: Secondary | ICD-10-CM | POA: Diagnosis not present

## 2021-07-28 DIAGNOSIS — J322 Chronic ethmoidal sinusitis: Secondary | ICD-10-CM | POA: Diagnosis not present

## 2021-07-28 DIAGNOSIS — Z87891 Personal history of nicotine dependence: Secondary | ICD-10-CM | POA: Diagnosis not present

## 2021-07-28 DIAGNOSIS — Z955 Presence of coronary angioplasty implant and graft: Secondary | ICD-10-CM | POA: Diagnosis not present

## 2021-07-28 DIAGNOSIS — E118 Type 2 diabetes mellitus with unspecified complications: Secondary | ICD-10-CM | POA: Diagnosis not present

## 2021-07-28 DIAGNOSIS — Z951 Presence of aortocoronary bypass graft: Secondary | ICD-10-CM | POA: Diagnosis not present

## 2021-07-28 DIAGNOSIS — Z7984 Long term (current) use of oral hypoglycemic drugs: Secondary | ICD-10-CM | POA: Diagnosis not present

## 2021-07-28 HISTORY — PX: NASAL SINUS SURGERY: SHX719

## 2021-07-28 HISTORY — PX: ETHMOIDECTOMY: SHX5197

## 2021-07-28 HISTORY — PX: FRONTAL SINUS EXPLORATION: SHX6591

## 2021-07-28 HISTORY — PX: MAXILLARY ANTROSTOMY: SHX2003

## 2021-07-28 LAB — GLUCOSE, CAPILLARY
Glucose-Capillary: 146 mg/dL — ABNORMAL HIGH (ref 70–99)
Glucose-Capillary: 130 mg/dL — ABNORMAL HIGH (ref 70–99)

## 2021-07-28 SURGERY — MAXILLARY ANTROSTOMY
Anesthesia: General | Site: Nose | Laterality: Right

## 2021-07-28 MED ORDER — CEFAZOLIN SODIUM-DEXTROSE 2-3 GM-%(50ML) IV SOLR
INTRAVENOUS | Status: DC | PRN
  Administered 2021-07-28: 2 g via INTRAVENOUS

## 2021-07-28 MED ORDER — PROPOFOL 10 MG/ML IV BOLUS
INTRAVENOUS | Status: AC
  Filled 2021-07-28: qty 20

## 2021-07-28 MED ORDER — LACTATED RINGERS IV SOLN: INTRAVENOUS | Status: DC

## 2021-07-28 MED ORDER — LIDOCAINE 2% (20 MG/ML) 5 ML SYRINGE
INTRAMUSCULAR | Status: AC
  Filled 2021-07-28: qty 5

## 2021-07-28 MED ORDER — AMOXICILLIN 875 MG PO TABS
875.0000 mg | ORAL_TABLET | Freq: Two times a day (BID) | ORAL | 0 refills | Status: AC
  Filled 2021-07-28: qty 6, 3d supply, fill #0

## 2021-07-28 MED ORDER — ROCURONIUM BROMIDE 100 MG/10ML IV SOLN
INTRAVENOUS | Status: DC | PRN
  Administered 2021-07-28: 50 mg via INTRAVENOUS

## 2021-07-28 MED ORDER — ONDANSETRON HCL 4 MG/2ML IJ SOLN: 4.0000 mg | Freq: Once | INTRAMUSCULAR | Status: DC | PRN

## 2021-07-28 MED ORDER — PHENYLEPHRINE HCL (PRESSORS) 10 MG/ML IV SOLN
INTRAVENOUS | Status: DC | PRN
  Administered 2021-07-28 (×6): 120 ug via INTRAVENOUS

## 2021-07-28 MED ORDER — MEPERIDINE HCL 25 MG/ML IJ SOLN: 6.2500 mg | INTRAMUSCULAR | Status: DC | PRN

## 2021-07-28 MED ORDER — PHENYLEPHRINE HCL-NACL 20-0.9 MG/250ML-% IV SOLN
INTRAVENOUS | Status: DC | PRN
  Administered 2021-07-28: 50 ug/min via INTRAVENOUS

## 2021-07-28 MED ORDER — DEXAMETHASONE SODIUM PHOSPHATE 4 MG/ML IJ SOLN
INTRAMUSCULAR | Status: DC | PRN
  Administered 2021-07-28: 4 mg via INTRAVENOUS

## 2021-07-28 MED ORDER — SUGAMMADEX SODIUM 200 MG/2ML IV SOLN
INTRAVENOUS | Status: DC | PRN
  Administered 2021-07-28: 200 mg via INTRAVENOUS

## 2021-07-28 MED ORDER — FENTANYL CITRATE (PF) 100 MCG/2ML IJ SOLN
INTRAMUSCULAR | Status: AC
  Filled 2021-07-28: qty 2

## 2021-07-28 MED ORDER — PHENYLEPHRINE 40 MCG/ML (10ML) SYRINGE FOR IV PUSH (FOR BLOOD PRESSURE SUPPORT)
PREFILLED_SYRINGE | INTRAVENOUS | Status: AC
  Filled 2021-07-28: qty 10

## 2021-07-28 MED ORDER — MIDAZOLAM HCL 5 MG/5ML IJ SOLN
INTRAMUSCULAR | Status: DC | PRN
Start: 2021-07-28 — End: 2021-07-28
  Administered 2021-07-28: 2 mg via INTRAVENOUS

## 2021-07-28 MED ORDER — PHENYLEPHRINE HCL (PRESSORS) 10 MG/ML IV SOLN
INTRAVENOUS | Status: AC
  Filled 2021-07-28: qty 1

## 2021-07-28 MED ORDER — ACETAMINOPHEN 10 MG/ML IV SOLN
INTRAVENOUS | Status: AC
  Filled 2021-07-28: qty 100

## 2021-07-28 MED ORDER — ONDANSETRON HCL 4 MG/2ML IJ SOLN
INTRAMUSCULAR | Status: DC | PRN
  Administered 2021-07-28: 4 mg via INTRAVENOUS

## 2021-07-28 MED ORDER — OXYMETAZOLINE HCL 0.05 % NA SOLN
NASAL | Status: DC | PRN
  Administered 2021-07-28: 1 via TOPICAL

## 2021-07-28 MED ORDER — ACETAMINOPHEN 10 MG/ML IV SOLN
1000.0000 mg | Freq: Once | INTRAVENOUS | Status: DC | PRN
  Administered 2021-07-28: 1000 mg via INTRAVENOUS

## 2021-07-28 MED ORDER — PROPOFOL 10 MG/ML IV BOLUS
INTRAVENOUS | Status: DC | PRN
  Administered 2021-07-28: 150 mg via INTRAVENOUS

## 2021-07-28 MED ORDER — LIDOCAINE HCL (CARDIAC) PF 100 MG/5ML IV SOSY
PREFILLED_SYRINGE | INTRAVENOUS | Status: DC | PRN
  Administered 2021-07-28: 100 mg via INTRAVENOUS

## 2021-07-28 MED ORDER — FENTANYL CITRATE (PF) 100 MCG/2ML IJ SOLN
INTRAMUSCULAR | Status: DC | PRN
  Administered 2021-07-28: 100 ug via INTRAVENOUS

## 2021-07-28 MED ORDER — MIDAZOLAM HCL 2 MG/2ML IJ SOLN
INTRAMUSCULAR | Status: AC
  Filled 2021-07-28: qty 2

## 2021-07-28 MED ORDER — MUPIROCIN 2 % EX OINT
TOPICAL_OINTMENT | CUTANEOUS | Status: DC | PRN
  Administered 2021-07-28: 1 via TOPICAL

## 2021-07-28 MED ORDER — FENTANYL CITRATE (PF) 100 MCG/2ML IJ SOLN
25.0000 ug | INTRAMUSCULAR | Status: DC | PRN
  Administered 2021-07-28 (×3): 50 ug via INTRAVENOUS

## 2021-07-28 MED ORDER — 0.9 % SODIUM CHLORIDE (POUR BTL) OPTIME
TOPICAL | Status: DC | PRN
  Administered 2021-07-28: 210 mL

## 2021-07-28 SURGICAL SUPPLY — 50 items
BLADE RAD40 ROTATE 4M 4 5PK (BLADE) IMPLANT
BLADE RAD60 ROTATE M4 4 5PK (BLADE) IMPLANT
BLADE ROTATE RAD 12 4 M4 (BLADE) IMPLANT
BLADE ROTATE RAD 40 4 M4 (BLADE) IMPLANT
BLADE ROTATE TRICUT 4X13 M4 (BLADE) ×3 IMPLANT
BLADE TRICUT ROTATE M4 4 5PK (BLADE) IMPLANT
BUR HS RAD FRONTAL 3 (BURR) IMPLANT
CANISTER SUC SOCK COL 7IN (MISCELLANEOUS) ×3 IMPLANT
CANISTER SUCT 1200ML W/VALVE (MISCELLANEOUS) ×6 IMPLANT
COAGULATOR SUCT 8FR VV (MISCELLANEOUS) IMPLANT
DECANTER SPIKE VIAL GLASS SM (MISCELLANEOUS) IMPLANT
DEFOGGER MIRROR 1QT (MISCELLANEOUS) ×3 IMPLANT
DRSG NASAL KENNEDY LMNT 8CM (GAUZE/BANDAGES/DRESSINGS) IMPLANT
DRSG NASOPORE 8CM (GAUZE/BANDAGES/DRESSINGS) IMPLANT
DRSG TELFA 3X8 NADH (GAUZE/BANDAGES/DRESSINGS) IMPLANT
ELECT REM PT RETURN 9FT ADLT (ELECTROSURGICAL) ×3
ELECTRODE REM PT RTRN 9FT ADLT (ELECTROSURGICAL) ×2 IMPLANT
GLOVE SURG ENC MOIS LTX SZ7.5 (GLOVE) ×3 IMPLANT
GLOVE SURG POLYISO LF SZ6.5 (GLOVE) ×2 IMPLANT
GLOVE SURG UNDER POLY LF SZ7 (GLOVE) ×2 IMPLANT
GOWN STRL REUS W/ TWL LRG LVL3 (GOWN DISPOSABLE) ×4 IMPLANT
GOWN STRL REUS W/TWL LRG LVL3 (GOWN DISPOSABLE) ×6
HEMOSTAT SURGICEL 2X14 (HEMOSTASIS) IMPLANT
IV NS 500ML (IV SOLUTION) ×3
IV NS 500ML BAXH (IV SOLUTION) ×2 IMPLANT
NDL HYPO 25X1 1.5 SAFETY (NEEDLE) ×1 IMPLANT
NDL SPNL 25GX3.5 QUINCKE BL (NEEDLE) IMPLANT
NEEDLE HYPO 25X1 1.5 SAFETY (NEEDLE) IMPLANT
NEEDLE SPNL 25GX3.5 QUINCKE BL (NEEDLE) IMPLANT
NS IRRIG 1000ML POUR BTL (IV SOLUTION) ×3 IMPLANT
PACK BASIN DAY SURGERY FS (CUSTOM PROCEDURE TRAY) ×3 IMPLANT
PACK ENT DAY SURGERY (CUSTOM PROCEDURE TRAY) ×3 IMPLANT
PAD DRESSING TELFA 3X8 NADH (GAUZE/BANDAGES/DRESSINGS) IMPLANT
SLEEVE SCD COMPRESS KNEE MED (STOCKING) ×3 IMPLANT
SPLINT NASAL AIRWAY SILICONE (MISCELLANEOUS) IMPLANT
SPONGE GAUZE 2X2 8PLY STRL LF (GAUZE/BANDAGES/DRESSINGS) ×3 IMPLANT
SPONGE NEURO XRAY DETECT 1X3 (DISPOSABLE) ×3 IMPLANT
SUCTION FRAZIER HANDLE 10FR (MISCELLANEOUS)
SUCTION TUBE FRAZIER 10FR DISP (MISCELLANEOUS) IMPLANT
SUT CHROMIC 4 0 P 3 18 (SUTURE) IMPLANT
SUT PLAIN 4 0 ~~LOC~~ 1 (SUTURE) IMPLANT
SUT PROLENE 3 0 PS 2 (SUTURE) IMPLANT
SYR 50ML LL SCALE MARK (SYRINGE) ×2 IMPLANT
TOWEL GREEN STERILE FF (TOWEL DISPOSABLE) ×3 IMPLANT
TRACKER ENT INSTRUMENT (MISCELLANEOUS) ×3 IMPLANT
TRACKER ENT PATIENT (MISCELLANEOUS) ×3 IMPLANT
TUBE CONNECTING 20X1/4 (TUBING) ×3 IMPLANT
TUBE SALEM SUMP 16 FR W/ARV (TUBING) IMPLANT
TUBING STRAIGHTSHOT EPS 5PK (TUBING) ×3 IMPLANT
YANKAUER SUCT BULB TIP NO VENT (SUCTIONS) ×3 IMPLANT

## 2021-07-28 NOTE — Transfer of Care (Signed)
Immediate Anesthesia Transfer of Care Note  Patient: Sarah Stevenson  Procedure(s) Performed: MAXILLARY ANTROSTOMY WITH TISSUE REMOVAL (Right: Nose) ETHMOIDECTOMY (Right: Nose) FRONTAL SINUS EXPLORATION (Right: Nose) ENDOSCOPIC SINUS SURGERY WITH STEALTH NAVIGATION (Right: Nose)  Patient Location: PACU  Anesthesia Type:General  Level of Consciousness: drowsy  Airway & Oxygen Therapy: Patient Spontanous Breathing and Patient connected to face mask oxygen  Post-op Assessment: Report given to RN and Post -op Vital signs reviewed and stable  Post vital signs: Reviewed and stable  Last Vitals:  Vitals Value Taken Time  BP    Temp    Pulse 70 07/28/21 1133  Resp 17 07/28/21 1133  SpO2 98 % 07/28/21 1133  Vitals shown include unvalidated device data.  Last Pain:  Vitals:   07/28/21 0929  TempSrc: Oral  PainSc: 4       Patients Stated Pain Goal: 3 (29/92/42 6834)  Complications: No notable events documented.

## 2021-07-28 NOTE — Anesthesia Preprocedure Evaluation (Addendum)
Anesthesia Evaluation  Patient identified by MRN, date of birth, ID band Patient awake    Reviewed: Allergy & Precautions, NPO status , Patient's Chart, lab work & pertinent test results  Airway Mallampati: II       Dental no notable dental hx. (+) Teeth Intact   Pulmonary former smoker,    Pulmonary exam normal        Cardiovascular + CAD and + CABG  Normal cardiovascular exam+ Valvular Problems/Murmurs      Neuro/Psych PSYCHIATRIC DISORDERS Depression    GI/Hepatic negative GI ROS, Neg liver ROS,   Endo/Other  diabetes, Type 2, Oral Hypoglycemic Agents  Renal/GU negative Renal ROS     Musculoskeletal  (+) Arthritis , Osteoarthritis,    Abdominal (+) + obese,   Peds  Hematology negative hematology ROS (+)   Anesthesia Other Findings IMPRESSIONS    1. The left ventricle has normal systolic function with an ejection  fraction of 60-65%. The cavity size was normal. There is mild concentric  left ventricular hypertrophy. Left ventricular diastolic Doppler  parameters are consistent with  pseudonormalization. Elevated left atrial and left ventricular  end-diastolic pressures The E/e' is 16. No evidence of left ventricular  regional wall motion abnormalities.  2. Left atrial size was mildly dilated.  3. Mild thickening of the mitral valve leaflet. There is moderate mitral  annular calcification present. No evidence of mitral valve stenosis.  4. A 76mm pericardial bioprosthesis valve is present in the aortic  position. Procedure Date: 05/17/2018 Normal aortic valve prosthesis.  5. Unchanged from prior of the aortic valve.  6. The aorta is normal unless otherwise noted.  7. The aortic root, ascending aorta and aortic arch are normal in size  and structure.   SUMMARY    Normal LV systolic function with grade 2 diastolic dysfunction and  E/e' of 16 suggesting elevated filling pressures. AVR is well   seated, no regurgitation, gradient is the same as compared to prior  study.  FINDINGS  Left Ventricle: The left ventricle has normal systolic function, with an  ejection fraction of 60-65%. The cavity size was normal. There is mild  concentric left ventricular hypertrophy. Left ventricular diastolic  Doppler parameters are consistent with  pseudonormalization. Elevated left atrial and left ventricular  end-diastolic pressures The E/e' is 16. No evidence of left ventricular  regional wall motion abnormalities..   Right Ventricle: The right ventricle has low normal systolic function. The  cavity was normal. There is no increase in right ventricular wall  thickness. Right ventricular systolic pressure is normal with an estimated  pressure of 16.5 mmHg.   Left Atrium: Left atrial size was mildly dilated.   Right Atrium: Right atrial size was normal in size. Right atrial pressure  is estimated at 3 mmHg.   Interatrial Septum: No atrial level shunt detected by color flow Doppler.   Pericardium: There is no evidence of pericardial effusion.   Mitral Valve: The mitral valve is normal in structure. Mild thickening of  the mitral valve leaflet. There is moderate mitral annular calcification  present. Mitral valve regurgitation is trivial by color flow Doppler. No  evidence of mitral valve stenosis.   Tricuspid Valve: The tricuspid valve is normal in structure. Tricuspid  valve regurgitation is trivial by color flow Doppler.   Aortic Valve: The aortic valve has been repaired/replaced Aortic valve  regurgitation was not visualized by color flow Doppler. There is unchanged  from prior of the aortic valve, with a calculated valve area  of 0.91 cm.  A 87mm pericardial aortic valve  bioprosthesis valve is present in the aortic position. Procedure Date:  05/17/2018 Normal aortic valve prosthesis.   Pulmonic Valve: The pulmonic valve was grossly normal. Pulmonic valve  regurgitation is not  visualized by color flow Doppler. No evidence of  pulmonic stenosis.   Aorta: The aortic root, ascending aorta and aortic arch are normal in size  and structure. The aorta is normal unless otherwise noted.   Pulmonary Artery: The pulmonary artery is not well seen.   Venous: The inferior vena cava is normal in size with greater than 50%  respiratory variability.     +--------------+--------++  LEFT VENTRICLE     +----------------+---------++  +--------------+--------++ Diastology          PLAX 2D         +----------------+---------++  +--------------+--------++ LV e' lateral: 6.64 cm/s  LVIDd:    4.46 cm  +----------------+---------++  +--------------+--------++ LV E/e' lateral:14.8     LVIDs:    2.82 cm  +----------------+---------++  +--------------+--------++ LV e' medial:  5.55 cm/s  LV PW:    1.24 cm  +----------------+---------++  +--------------+--------++ LV E/e' medial: 17.7     LV IVS:    1.19 cm  +----------------+---------++  +--------------+--------++  LVOT diam:  1.70 cm   +--------------+--------++  LV SV:    60 ml    +--------------+--------++  LV SV Index: 29.69    +--------------+--------++  LVOT Area:  2.27 cm  +--------------+--------++               +--------------+--------++   +---------------+---------++  RIGHT VENTRICLE       +---------------+---------++  RV Basal diam: 3.01 cm   +---------------+---------++  RV S prime:  9.90 cm/s  +---------------+---------++  TAPSE (M-mode):1.5 cm    +---------------+---------++  RVSP:     16.5 mmHg  +---------------+---------++   +---------------+-------++-----------++  LEFT ATRIUM      Index     +---------------+-------++-----------++  LA diam:    4.60 cm2.41 cm/m   +---------------+-------++-----------++  LA Vol (A2C):  60.6 ml31.70 ml/m  +---------------+-------++-----------++  LA Vol (A4C): 60.6 ml31.70 ml/m  +---------------+-------++-----------++  LA Biplane Vol:64.6 ml33.79 ml/m  +---------------+-------++-----------++  +------------+---------++-----------++  RIGHT ATRIUM     Index     +------------+---------++-----------++  RA Pressure:3.00 mmHg        +------------+---------++-----------++  RA Area:  16.20 cm        +------------+---------++-----------++  RA Volume: 44.00 ml 23.02 ml/m  +------------+---------++-----------++  +------------------+------------++  AORTIC VALVE           +------------------+------------++  AV Area (Vmax):  0.85 cm    +------------------+------------++  AV Area (Vmean): 0.88 cm    +------------------+------------++  AV Area (VTI):  0.91 cm    +------------------+------------++  AV Vmax:     272.00 cm/s   +------------------+------------++  AV Vmean:     197.000 cm/s  +------------------+------------++  AV VTI:      0.638 m     +------------------+------------++  AV Peak Grad:   29.6 mmHg    +------------------+------------++  AV Mean Grad:   17.0 mmHg    +------------------+------------++  LVOT Vmax:    102.00 cm/s   +------------------+------------++  LVOT Vmean:    76.800 cm/s   +------------------+------------++  LVOT VTI:     0.257 m     +------------------+------------++  LVOT/AV VTI ratio:0.40      +------------------+------------++    +-------------+-------++  AORTA          +-------------+-------++  Ao Root diam:2.40  cm  +-------------+-------++   +--------------+--------++  +---------------+-----------++  MITRAL VALVE       TRICUSPID VALVE        +--------------+--------++  +---------------+-----------++  MV Area  (PHT):      TR Peak grad: 13.5 mmHg   +--------------+--------++  +---------------+-----------++  MV PHT:          TR Vmax:    217.00 cm/s  +--------------+--------++  +---------------+-----------++  MV Decel Time:208 msec  Estimated RAP: 3.00 mmHg   +--------------+--------++  +---------------+-----------++  +--------------+-----------++ RVSP:     16.5 mmHg   MV E velocity:98.50 cm/s  +---------------+-----------++  +--------------+-----------++  MV A velocity:110.00 cm/s +--------------+-------+  +--------------+-----------++ SHUNTS          MV E/A ratio: 0.90     +--------------+-------+  +--------------+-----------++ Systemic VTI: 0.26 m                 +--------------+-------+                Systemic Diam:1.70 cm                +--------------+-------+     Buford Dresser MD  Electronically signed by Buford Dresser MD  Signature Date/Time: 05/22/2019/7:40:54 PM      Final   Imaging Info  ECHOCARDIOGRAM COMPLETE (Order #[id number]) on 05/22/19  Study History  ECHOCARDIOGRAM COMPLETE (Order #[id number]) on 05/22/19  Syngo Images  Show images for ECHOCARDIOGRAM COMPLETE Images on [employer] Term Storage  Show images for Yalda, Herd "Teri" Performing Technologist/Nurse  Performing Technologist/Nurse: Trey Paula Reason for Exam Priority: Routine Dx: H/O aortic valve replacement (Z95.2 (ICD-10-CM))  Surgical History  Surgical History   Procedure Laterality Date Comment Source CARDIAC CATHETERIZATION Right 11/30/2014 Procedure: RIGHT/LEFT HEART CATH AND CORONARY ANGIOGRAPHY; Surgeon: Belva Crome, MD; Location: [facility] CATH LAB; Service: Cardiovascular; Laterality: Right;  CARDIAC CATHETERIZATION N/A 12/13/2015 Procedure: Left Heart Cath and Coronary Angiography; Surgeon: Leonie Man, MD; Location:  [place] CV LAB; Service: Cardiovascular; Laterality: N/A;  CARDIAC CATHETERIZATION N/A 12/13/2015 Procedure: Intravascular Pressure Wire/FFR Study; Surgeon: Leonie Man, MD; Location: [place] [facility] LAB; Service: Cardiovascular; Laterality: N/A; RCA  CARDIAC CATHETERIZATION N/A 08/06/2016 Procedure: Right/Left Heart Cath and Coronary Angiography; Surgeon: Lorretta Harp, MD; Location: [place] CV LAB; Service: Cardiovascular; Laterality: N/A;  CARDIAC CATHETERIZATION N/A 08/06/2016 Procedure: Coronary Stent Intervention; Surgeon: Lorretta Harp, MD; Location: [place] CV LAB; Service: Cardiovascular; Laterality: N/A;  distal rca 3.0x16 and 3.0x8 synergy  CORONARY ANGIOPLASTY WITH STENT PLACEMENT  08/06/2016 "2 stents"  CORONARY ARTERY BYPASS GRAFT N/A 05/17/2018 Procedure: CORONARY ARTERY BYPASS GRAFTING (CABG) X 2 WITH ENDOSCOPIC HARVESTING OF RIGHT SAPHENOUS VEIN. LIMA TO LAD. SVG TO RCA; Surgeon: Grace Isaac, MD; Location: [place]; Service: Open Heart Surgery; Laterality: N/A;   Other Surgical History   Procedure Laterality Date Comment Source AORTIC VALVE REPLACEMENT N/A 05/17/2018 Procedure: AORTIC VALVE REPLACEMENT (AVR) USING 21 MM MAGNA EASE PERICARDIAL BIOPROSTHESIS. MODEL # [id number]. SERIAL # [id number]; Surgeon: Grace Isaac, MD; Location: [place]; Service: Open Heart Surgery; Laterality: N/A;  CESAREAN SECTION  1989   HYSTEROSCOPY DIAGNOSTIC  1990s   JOINT REPLACEMENT     KNEE ARTHROSCOPY Left 1996   NASAL SINUS SURGERY  1977   RIGHT/LEFT HEART CATH AND CORONARY ANGIOGRAPHY N/A 04/01/2018 Procedure: RIGHT/LEFT HEART CATH AND CORONARY ANGIOGRAPHY; Surgeon: Belva Crome, MD; Location: [place] CV LAB; Service: Cardiovascular; Laterality: N/A;  TEE WITHOUT CARDIOVERSION N/A 05/17/2018 Procedure: TRANSESOPHAGEAL ECHOCARDIOGRAM (TEE); Surgeon: Grace Isaac, MD;  Location: [facility] OR; Service: Open Heart Surgery; Laterality:  N/A;  TOTAL HIP ARTHROPLASTY Left 04/23/2017 Procedure: LEFT TOTAL HIP ARTHROPLASTY ANTERIOR APPROACH; Surgeon: Mcarthur Rossetti, MD; Location: [facility] OR[facility]; Service: Orthopedics; Laterality: Left;  WEDGE RESECTION Left 05/17/2018 Procedure: LEFT UPPER LOBE LUNG WEDGE RESECTION; Surgeon: Grace Isaac, MD; Location: [place]; Service: Open Heart Surgery; Laterality: Left;    Implants  Permanent Stent Stent Synergy Des 3x16 - [id number] - Implanted Dist RCA  Inventory item: Melissa Noon DES 3X16 Model/Cat number: [id number] Manufacturer: [employer] Lot number: [id number] Device identifier: [id number] Device identifier type: GS1 Area Of Implantation: Dist RCA   As of 08/06/2016  Status: Implanted    Stent Synergy Des 3x8 - [id number] - Implanted Mid RCA  Inventory item: Melissa Noon DES 3X8 Model/Cat number: [id number] Manufacturer: [employer] Lot number: [id number] Device identifier: [id number] Device identifier type: GS1 Area Of Implantation: Mid RCA   As of 08/06/2016  Status: Implanted    Prosthesis/Implant Heart Valve Magna Ease 9mm - [id number] - Implanted Heart  Inventory item: VALVE MAGNA EASE 21MM Model/Cat number: [id number] Serial number: [id number] Manufacturer: [employer] Area Of Implantation: Heart   As of 05/17/2018  Status: Implanted    Type Not Specified Capt Hip Total 2 - [id number] - Capitated Charge Entry (Left) Hip  Inventory item: CAPT HIP TOTAL 2 Model/Cat number: [id number] Manufacturer: [employer]   As of 04/23/2017  Status: Optometrist Entry    ? Calcific aortic stenosis, moderately severe with calculated aortic valve area 0.92 cm.  Peak to peak gradient 45 mmHg. ? Two-vessel coronary artery disease with 70% mid LAD stenosis and 50% in-stent restenosis in the right coronary within the region of overlapping double layer stent. ? Widely patent circumflex artery. ? Normal  left main. ? Normal pulmonary pressures and left ventricular hemodynamics. ? Limiting exertional dyspnea secondary to aortic stenosis predominantly with contribution from CAD.  Recommendations:  ? Aortic valve replacement ? LIMA to LAD and SVG to RCA ? Patient has an appointment to see Dr. Servando Snare.     Reproductive/Obstetrics                            Anesthesia Physical Anesthesia Plan  ASA: 3  Anesthesia Plan: General   Post-op Pain Management:    Induction: Intravenous  PONV Risk Score and Plan: 4 or greater and Ondansetron, Midazolam and Treatment may vary due to age or medical condition  Airway Management Planned: Oral ETT  Additional Equipment: None  Intra-op Plan:   Post-operative Plan: Extubation in OR  Informed Consent: I have reviewed the patients History and Physical, chart, labs and discussed the procedure including the risks, benefits and alternatives for the proposed anesthesia with the patient or authorized representative who has indicated his/her understanding and acceptance.     Dental advisory given  Plan Discussed with: CRNA  Anesthesia Plan Comments:         Anesthesia Quick Evaluation

## 2021-07-28 NOTE — Discharge Instructions (Addendum)
Tylenol 1000 mg given at [facility] Instructions  Activity: Get plenty of rest for the remainder of the day. A responsible individual must stay with you for 24 hours following the procedure.  For the next 24 hours, DO NOT: -Drive a car -Paediatric nurse -Drink alcoholic beverages -Take any medication unless instructed by your physician -Make any legal decisions or sign important papers.  Meals: Start with liquid foods such as gelatin or soup. Progress to regular foods as tolerated. Avoid greasy, spicy, heavy foods. If nausea and/or vomiting occur, drink only clear liquids until the nausea and/or vomiting subsides. Call your physician if vomiting continues.  Special Instructions/Symptoms: Your throat may feel dry or sore from the anesthesia or the breathing tube placed in your throat during surgery. If this causes discomfort, gargle with warm salt water. The discomfort should disappear within 24 hours.  If you had a scopolamine patch placed behind your ear for the management of post- operative nausea and/or vomiting:  1. The medication in the patch is effective for 72 hours, after which it should be removed.  Wrap patch in a tissue and discard in the trash. Wash hands thoroughly with soap and water. 2. You may remove the patch earlier than 72 hours if you experience unpleasant side effects which may include dry mouth, dizziness or visual disturbances. 3. Avoid touching the patch. Wash your hands with soap and water after contact with the patch.     -------------  POSTOPERATIVE INSTRUCTIONS FOR PATIENTS HAVING NASAL OR SINUS OPERATIONS ACTIVITY: Restrict activity at home for the first two days, resting as much as possible. Light activity is best. You may usually return to work within a week. You should refrain from nose blowing, strenuous activity, or heavy lifting greater than 20lbs for a total of one week after your operation.  If sneezing cannot be avoided, sneeze  with your mouth open. DISCOMFORT: You may experience a dull headache and pressure along with nasal congestion and discharge. These symptoms may be worse during the first week after the operation but may last as long as two to four weeks.  Please take Tylenol or the pain medication that has been prescribed for you. Do not take aspirin or aspirin containing medications since they may cause bleeding.  You may experience symptoms of post nasal drainage, nasal congestion, headaches and fatigue for two or three months after your operation.  BLEEDING: You may have some blood tinged nasal drainage for approximately two weeks after the operation.  The discharge will be worse for the first week.  Please call our office at [phone] or go to the nearest hospital emergency room if you experience any of the following: heavy, bright red blood from your nose or mouth that lasts longer than 15 minutes or coughing up or vomiting bright red blood or blood clots. GENERAL CONSIDERATIONS: A gauze dressing will be placed on your upper lip to absorb any drainage after the operation. You may need to change this several times a day.  If you do not have very much drainage, you may remove the dressing.  Remember that you may gently wipe your nose with a tissue and sniff in, but DO NOT blow your nose. Please keep all of your postoperative appointments.  Your final results after the operation will depend on proper follow-up.  The initial visit is usually 2 to 5 days after the operation.  During this visit, the remaining nasal packing and internal septal splints will be removed.  Your nasal and sinus cavities will be cleaned.  During the second visit, your nasal and sinus cavities will be cleaned again. Have someone drive you to your first two postoperative appointments.  How you care for your nose after the operation will influence the results that you obtain.  You should follow all directions, take your medication as prescribed, and  call our office [phone] with any problems or questions. You may be more comfortable sleeping with your head elevated on two pillows. Do not take any medications that we have not prescribed or recommended. WARNING SIGNS: if any of the following should occur, please call our office: Persistent fever greater than 102F. Persistent vomiting. Severe and constant pain that is not relieved by prescribed pain medication. Trauma to the nose. Rash or unusual side effects from any medicines.

## 2021-07-28 NOTE — H&P (Signed)
Cc: Chronic rhinosinusitis  HPI: The patient is a 65 year old female who returns today for follow-up of her chronic sinusitis.  The patient has a history of frequent sinus infections.  She underwent sinus surgery in the 1970's.  At her last visit, purulent drainage was noted from the right middle meatus.  She was treated with another course of extended antibiotics.  She also uses Allergy and Singulair daily.  She has been treated with Flonase nasal spray in the past.  The patient subsequently underwent a paranasal sinus CT scan.  The CT showed complete opacification of her right frontal, maxillary and ethmoid sinuses.  Her right frontal recess and right ostiomeatal complex were completely obstructed.  No sinus disease was noted on the left side.  The patient returns today complaining of persistent right nasal drainage.  She has not noted any significant improvement with extended antibiotics and steroid treatment.   Exam: General: Communicates without difficulty, well nourished, no acute distress. Head: Normocephalic, no evidence injury, no tenderness, facial buttresses intact without stepoff. Eyes: PERRL, EOMI. No scleral icterus, conjunctivae clear. Neuro: CN II exam reveals vision grossly intact.  No nystagmus at any point of gaze. Ears: Auricles well formed without lesions.  Ear canals are intact without mass or lesion.  No erythema or edema is appreciated.  The TMs are intact without fluid. Nose: External evaluation reveals normal support and skin without lesions.  Dorsum is intact.  Anterior rhinoscopy reveals congested and edematous mucosa over anterior aspect of the inferior turbinates and nasal septum.  No purulence is noted. Middle meatus is not well visualized. Oral:  Oral cavity and oropharynx are intact, symmetric, without erythema or edema.  Mucosa is moist without lesions. Neck: Full range of motion without pain.  There is no significant lymphadenopathy.  No masses palpable.  Thyroid bed within  normal limits to palpation.  Parotid glands and submandibular glands equal bilaterally without mass.  Trachea is midline. Neuro:  CN 2-12 grossly intact. Gait normal. Vestibular: No nystagmus at any point of gaze. A flexible scope was inserted into the right nasal cavity.  Endoscopy of the interior nasal cavity, superior, inferior, and middle meatus was performed. The sphenoid-ethmoid recess was examined. Edematous and erythematous mucosa was noted.  Olfactory cleft was edematous.  Nasopharynx was clear.  Turbinates were hypertrophied. The procedure was repeated on the contralateral side with similar findings.  The patient tolerated the procedure well.  Assessment: 1.  Chronic right-sided rhinosinusitis, involving the right frontal, maxillary and ethmoid sinuses.  The patient has not responded to maximal medical treatment.   Plan: 1.  The nasal endoscopy findings and the CT images are reviewed with the patient.  2.  Continue with current allergy treatment regimen.  3.  In light of her persistent symptoms, she will benefit from undergoing surgical intervention with endoscopic sinus surgery of her right frontal, maxillary and ethmoid sinuses.  The risks, benefits, alternatives and details of the procedures are reviewed with the patients.   4.  The patient would like to proceed with the procedures.

## 2021-07-28 NOTE — Op Note (Signed)
DATE OF PROCEDURE: 07/28/2021  OPERATIVE REPORT   SURGEON: Leta Baptist, MD   PREOPERATIVE DIAGNOSES:  1. Chronic right frontal, maxillary, and ethmoid sinusitis and polyposis  POSTOPERATIVE DIAGNOSES:  1. Chronic right frontal, maxillary, and ethmoid sinusitis and polyposis  PROCEDURE PERFORMED:  1. Endoscopic right frontal sinusotomy and total ethmoidectomy with polyp removal. 2. Endoscopic right maxillary antrostomy with polyp removal. 3. FUSION stereotactic image guidance  ANESTHESIA: General endotracheal tube anesthesia.   COMPLICATIONS: None.   ESTIMATED BLOOD LOSS: 50 mL.   INDICATION FOR PROCEDURE: Sarah Stevenson is a 65 y.o. female with a history of frequent sinus infections.  She underwent sinus surgery in the 1970's.  At her last visit, purulent drainage was noted from the right middle meatus.  She was treated with another course of extended antibiotics.  She also uses Allergy and Singulair daily.  She has been treated with Flonase nasal spray in the past.  The patient subsequently underwent a paranasal sinus CT scan.  The CT showed complete opacification of her right frontal, maxillary and ethmoid sinuses.  Her right frontal recess and right ostiomeatal complex were completely obstructed.  No sinus disease was noted on the left side.  Based on the above findings, the decision was made for the patient to undergo the above-stated procedures. The risks, benefits, alternatives, and details of the procedures were discussed with the patient. Questions were invited and answered. Informed consent was obtained.   DESCRIPTION OF PROCEDURE: The patient was taken to the operating room and placed supine on the operating table. General endotracheal tube anesthesia was administered by the anesthesiologist. The patient was positioned, and prepped and draped in the standard fashion for nasal surgery. Pledgets soaked with Afrin were placed in both nasal cavities for decongestion. The pledgets were  subsequently removed. The FUSION stereotactic image guidance marker was placed. The image guidance system was functional throughout the case.  Using a 0 endoscope, the right nasal cavity was examined. A large middle turbinate was noted. Using Tru-Cut forceps, the inferior one third of the middle turbinate was resected. Polypoid tissue and purulent drainage were noted within the middle meatus. The polypoid tissue was removed using a combination of microdebrider and Blakesley forceps. The uncinate process was resected with a freer elevator. The maxillary antrum was entered and enlarged using a combination of backbiter and microdebrider. Polypoid tissue was removed from the right maxillary sinus.  Attention was then focused on the ethmoid sinuses. The bony partitions of the anterior and posterior ethmoid cavities were taken down. Polypoid tissue was noted and removed.  Attention was then focused on the frontal sinus. The frontal recess was identified and enlarged by removing the surrounding bony partitions. Polypoid tissue and purulent fluid were removed from the frontal recess. All sinuses were copiously irrigated with saline solution.  The care of the patient was turned over to the anesthesiologist. The patient was awakened from anesthesia without difficulty. The patient was extubated and transferred to the recovery room in good condition.   OPERATIVE FINDINGS: Chronic right frontal, ethmoid, and maxillary sinusitis and polyposis.  SPECIMEN: Right-sided sinus contents.   FOLLOWUP CARE: The patient be discharged home once she is awake and alert. The patient will be placed on amoxicillin 875 mg p.o. b.i.d. for 3 days. The patient will follow up in my office in 7 days for sinus debridement.  Messi Twedt Raynelle Bring, MD

## 2021-07-28 NOTE — Anesthesia Procedure Notes (Signed)
Procedure Name: Intubation Date/Time: 07/28/2021 10:43 AM Performed by: Ezequiel Kayser, CRNA Pre-anesthesia Checklist: Patient identified, Emergency Drugs available, Suction available and Patient being monitored Patient Re-evaluated:Patient Re-evaluated prior to induction Oxygen Delivery Method: Circle System Utilized Preoxygenation: Pre-oxygenation with 100% oxygen Induction Type: IV induction Ventilation: Mask ventilation without difficulty Laryngoscope Size: Mac and 3 Grade View: Grade II Tube type: Oral Tube size: 7.0 mm Number of attempts: 1 Airway Equipment and Method: Stylet and Oral airway Placement Confirmation: ETT inserted through vocal cords under direct vision, positive ETCO2 and breath sounds checked- equal and bilateral Tube secured with: Tape Dental Injury: Teeth and Oropharynx as per pre-operative assessment

## 2021-07-28 NOTE — Anesthesia Postprocedure Evaluation (Signed)
Anesthesia Post Note  Patient: Sarah Stevenson  Procedure(s) Performed: MAXILLARY ANTROSTOMY WITH TISSUE REMOVAL (Right: Nose) ETHMOIDECTOMY (Right: Nose) FRONTAL SINUS EXPLORATION (Right: Nose) ENDOSCOPIC SINUS SURGERY WITH STEALTH NAVIGATION (Right: Nose)     Patient location during evaluation: Phase II Anesthesia Type: General Level of consciousness: awake and sedated Pain management: pain level controlled Vital Signs Assessment: post-procedure vital signs reviewed and stable Respiratory status: spontaneous breathing Cardiovascular status: stable Postop Assessment: no apparent nausea or vomiting Anesthetic complications: no   No notable events documented.  Last Vitals:  Vitals:   07/28/21 1230 07/28/21 1235  BP: 120/75   Pulse: 70 71  Resp: 15 16  Temp:    SpO2: 96% 94%    Last Pain:  Vitals:   07/28/21 1215  TempSrc:   PainSc: [place] Jr

## 2021-07-29 ENCOUNTER — Encounter (HOSPITAL_BASED_OUTPATIENT_CLINIC_OR_DEPARTMENT_OTHER): Payer: Self-pay | Admitting: Otolaryngology

## 2021-07-29 LAB — SURGICAL PATHOLOGY

## 2021-07-30 ENCOUNTER — Encounter: Payer: Self-pay | Admitting: Orthopaedic Surgery

## 2021-07-30 ENCOUNTER — Other Ambulatory Visit (HOSPITAL_BASED_OUTPATIENT_CLINIC_OR_DEPARTMENT_OTHER): Payer: Self-pay

## 2021-07-30 ENCOUNTER — Ambulatory Visit (INDEPENDENT_AMBULATORY_CARE_PROVIDER_SITE_OTHER): Payer: PPO

## 2021-07-30 ENCOUNTER — Ambulatory Visit: Payer: PPO | Admitting: Orthopaedic Surgery

## 2021-07-30 ENCOUNTER — Other Ambulatory Visit: Payer: Self-pay

## 2021-07-30 VITALS — Ht 62.5 in | Wt 198.8 lb

## 2021-07-30 DIAGNOSIS — M25551 Pain in right hip: Secondary | ICD-10-CM

## 2021-07-30 DIAGNOSIS — M545 Low back pain, unspecified: Secondary | ICD-10-CM | POA: Diagnosis not present

## 2021-07-30 MED ORDER — METFORMIN HCL 850 MG PO TABS
850.0000 mg | ORAL_TABLET | Freq: Two times a day (BID) | ORAL | 0 refills | Status: DC
  Filled 2021-07-30: qty 180, 90d supply, fill #0

## 2021-07-30 MED ORDER — CELECOXIB 200 MG PO CAPS
ORAL_CAPSULE | ORAL | 3 refills | Status: DC
Start: 2021-07-30 — End: 2022-06-16
  Filled 2021-07-30: qty 90, 90d supply, fill #0
  Filled 2021-11-11: qty 90, 90d supply, fill #1
  Filled 2022-02-16: qty 90, 90d supply, fill #2
  Filled 2022-05-20: qty 90, 90d supply, fill #3

## 2021-07-30 NOTE — Progress Notes (Signed)
Office Visit Note   Patient: Sarah Stevenson           Date of Birth: 1956-02-24           MRN: [id number] Visit Date: 07/30/2021              Requested by: Christain Sacramento, MD [place],  [place] [id number] PCP: Christain Sacramento, MD   Assessment & Plan: Visit Diagnoses:  1. Pain in right hip   2. Low back pain, unspecified back pain laterality, unspecified chronicity, unspecified whether sciatica present     Plan: I reviewed her hip x-rays with her as far back as 2016.  Then she did have a well-maintained joint space.  Now she has bone-on-bone arthritis in the right hip.  At this point she is interested in hip replacement surgery and I think this is going to actually help her posture and her back.  We talked in detail about the risks and benefits of the surgery.  She is fully aware of this having had this successfully performed on her left hip in 2018.  She will need to stop her blood thinning medication 6 days prior to surgery.  All questions and concerns were answered addressed.  We will work on getting this scheduled.  Follow-Up Instructions: Return for 2 weeks post-op.   Orders:  Orders Placed This Encounter  Procedures   XR HIP UNILAT W OR W/O PELVIS 2-3 VIEWS RIGHT   XR Lumbar Spine 2-3 Views   No orders of the defined types were placed in this encounter.     Procedures: No procedures performed   Clinical Data: No additional findings.   Subjective: Chief Complaint  Patient presents with   Right Hip - Pain   Lower Back - Pain  The patient is actually someone well-known to me but I have not seen her in many years so she is she is listed as a new patient.  I actually replaced her left hip in 2018.  She has been doing well with her left hip but has been having worsening right hip pain for well over 12 months now. It is affecting her posture and it is detriment affecting her mobility, her quality of life and activity living.  She is also having low back  pain and left-sided radicular symptoms with numbness and tingling in her left foot.  That is been off and on.  She feels like some of the back changes are related to how she is trying to walk different offload her right hip.  Since I have seen her she has had cardiac bypass surgery and heart valve surgery.  She also had lung cancer and now she is at her optimal health.  She denies any headache, chest pain, shortness of breath, fever, chills, nausea, vomiting.  She is on a blood thinning medication which she recently stopped and can be off of for sinus surgery.  She says she is able to stop this for 6 to 7 days.  She is not a diabetic.  HPI  Review of Systems See above  Objective: Vital Signs: Ht 5' 2.5" (1.588 m)   Wt 198 lb 12.8 oz (90.2 kg)   BMI 35.78 kg/m   Physical Exam She is alert and orient x3 and in no acute distress Ortho Exam Her left operative hip moves smoothly and fluidly.  Her right hip shows severe pain with internal and external rotation and significant stiffness with rotation.  She does  not have a positive straight leg raise on the left or right side.  There is some subjective numbness in her toes on the left side but good muscle tone and good strength.  She is walking without an assistive device but she does have a limp. Specialty Comments:  No specialty comments available.  Imaging: XR HIP UNILAT W OR W/O PELVIS 2-3 VIEWS RIGHT  Result Date: 07/30/2021 An AP pelvis and lateral right hip shows severe end-stage arthritis of the right hip with complete loss of joint space.  This is worsened significantly over several years.  X-rays were seen back as far as 2016 with joint space still remaining but now there is essentially severe end-stage arthritis with bone-on-bone wear.  There is a well-seated left total hip arthroplasty.  XR Lumbar Spine 2-3 Views  Result Date: 07/30/2021 2 views of the lumbar spine show no acute findings.  There is some loss of lordosis and  degenerative disc disease and degenerative changes at multiple levels.    PMFS History: Patient Active Problem List   Diagnosis Date Noted   H/O aortic valve replacement 07/03/2018   Adenocarcinoma of lung, left ([place]) 05/20/2018   Coronary artery disease involving coronary bypass graft of native heart with angina pectoris ([place]) 05/17/2018   DOE (dyspnea on exertion)    Trochanteric bursitis, right hip 12/01/2017   Unilateral primary osteoarthritis, right hip 12/01/2017   Status post total replacement of left hip 04/23/2017   Pain in left hip 03/31/2017   Unilateral primary osteoarthritis, left hip 03/31/2017   Sciatica, left side 03/31/2017   Severe aortic stenosis    Positive cardiac stress test    Trochanteric bursitis of left hip 08/14/2015   Arthritis of left hip 08/14/2015   Excessive daytime sleepiness 03/03/2015   Snoring 12/20/2014   Type 2 diabetes mellitus with complication ([place])    Hyperlipidemia    Past Medical History:  Diagnosis Date   Anginal pain ([place])    Aortic stenosis    Coronary artery disease    Depression    "mild" (08/06/2016)   Dyspnea    Hyperlipidemia    lung ca dx'd 05/2018   Murmur, cardiac    Myocardial infarction ([place]) 2016   Osteoarthritis    "left hip and knee" (08/06/2016)   Type II diabetes mellitus ([place])     Family History  Problem Relation Age of Onset   Thyroid disease Mother    Heart attack Mother    Dementia Mother    Heart attack Father    Sudden death Father    Breast cancer Maternal Aunt    Hypertension Neg Hx    Hyperlipidemia Neg Hx    Diabetes Neg Hx     Past Surgical History:  Procedure Laterality Date   AORTIC VALVE REPLACEMENT N/A 05/17/2018   Procedure: AORTIC VALVE REPLACEMENT (AVR) USING 21 MM MAGNA EASE PERICARDIAL BIOPROSTHESIS. MODEL # [id number]. SERIAL # [id number];  Surgeon: Grace Isaac, MD;  Location: [place];  Service: Open Heart Surgery;  Laterality: N/A;   CARDIAC CATHETERIZATION Right 11/30/2014    Procedure: RIGHT/LEFT HEART CATH AND CORONARY ANGIOGRAPHY;  Surgeon: Belva Crome, MD;  Location: [facility] CATH LAB;  Service: Cardiovascular;  Laterality: Right;   CARDIAC CATHETERIZATION N/A 12/13/2015   Procedure: Left Heart Cath and Coronary Angiography;  Surgeon: Leonie Man, MD;  Location: [place] CV LAB;  Service: Cardiovascular;  Laterality: N/A;   CARDIAC CATHETERIZATION N/A 12/13/2015   Procedure: Intravascular Pressure Wire/FFR Study;  Surgeon:  Leonie Man, MD;  Location: [place] CV LAB;  Service: Cardiovascular;  Laterality: N/A;  RCA   CARDIAC CATHETERIZATION N/A 08/06/2016   Procedure: Right/Left Heart Cath and Coronary Angiography;  Surgeon: Lorretta Harp, MD;  Location: [place] CV LAB;  Service: Cardiovascular;  Laterality: N/A;   CARDIAC CATHETERIZATION N/A 08/06/2016   Procedure: Coronary Stent Intervention;  Surgeon: Lorretta Harp, MD;  Location: [place] CV LAB;  Service: Cardiovascular;  Laterality: N/A;   distal rca 3.0x16 and 3.0x8 synergy   [place] WITH STENT PLACEMENT  08/06/2016   "2 stents"   CORONARY ARTERY BYPASS GRAFT N/A 05/17/2018   Procedure: CORONARY ARTERY BYPASS GRAFTING (CABG) X 2 WITH ENDOSCOPIC HARVESTING OF RIGHT SAPHENOUS VEIN. LIMA TO LAD. SVG TO RCA;  Surgeon: Grace Isaac, MD;  Location: [facility];  Service: Open Heart Surgery;  Laterality: N/A;   ETHMOIDECTOMY Right 07/28/2021   Procedure: ETHMOIDECTOMY;  Surgeon: Leta Baptist, MD;  Location: [facility];  Service: ENT;  Laterality: Right;   FRONTAL SINUS EXPLORATION Right 07/28/2021   Procedure: FRONTAL SINUS EXPLORATION;  Surgeon: Leta Baptist, MD;  Location: [facility];  Service: ENT;  Laterality: Right;   HYSTEROSCOPY DIAGNOSTIC  1990s   JOINT REPLACEMENT     KNEE ARTHROSCOPY Left 1996   MAXILLARY ANTROSTOMY Right 07/28/2021   Procedure: MAXILLARY ANTROSTOMY WITH TISSUE REMOVAL;  Surgeon: Leta Baptist, MD;  Location:  [place];  Service: ENT;  Laterality: Right;   [place] Right 07/28/2021   Procedure: ENDOSCOPIC SINUS SURGERY WITH STEALTH NAVIGATION;  Surgeon: Leta Baptist, MD;  Location: [facility];  Service: ENT;  Laterality: Right;   RIGHT/LEFT HEART CATH AND CORONARY ANGIOGRAPHY N/A 04/01/2018   Procedure: RIGHT/LEFT HEART CATH AND CORONARY ANGIOGRAPHY;  Surgeon: Belva Crome, MD;  Location: [facility] CV LAB;  Service: Cardiovascular;  Laterality: N/A;   TEE WITHOUT CARDIOVERSION N/A 05/17/2018   Procedure: TRANSESOPHAGEAL ECHOCARDIOGRAM (TEE);  Surgeon: Grace Isaac, MD;  Location: [facility];  Service: Open Heart Surgery;  Laterality: N/A;   TOTAL HIP ARTHROPLASTY Left 04/23/2017   Procedure: LEFT TOTAL HIP ARTHROPLASTY ANTERIOR APPROACH;  Surgeon: Mcarthur Rossetti, MD;  Location: [facility];  Service: Orthopedics;  Laterality: Left;   WEDGE RESECTION Left 05/17/2018   Procedure: LEFT UPPER LOBE LUNG WEDGE RESECTION;  Surgeon: Grace Isaac, MD;  Location: [facility];  Service: Open Heart Surgery;  Laterality: Left;   Social History   Occupational History   Not on file  Tobacco Use   Smoking status: Former    Packs/day: 1.00    Years: 10.00    Pack years: 10.00    Types: Cigarettes    Quit date: 1982    Years since quitting: 40.9   Smokeless tobacco: Never   Tobacco comments:    restarted  about 5-6 yrs ago, then stopped  Vaping Use   Vaping Use: Former  Substance and Sexual Activity   Alcohol use: Not Currently    Comment: occansionally   Drug use: No   Sexual activity: Never

## 2021-08-01 ENCOUNTER — Other Ambulatory Visit (HOSPITAL_BASED_OUTPATIENT_CLINIC_OR_DEPARTMENT_OTHER): Payer: Self-pay

## 2021-08-04 DIAGNOSIS — J338 Other polyp of sinus: Secondary | ICD-10-CM | POA: Diagnosis not present

## 2021-08-04 DIAGNOSIS — J322 Chronic ethmoidal sinusitis: Secondary | ICD-10-CM | POA: Diagnosis not present

## 2021-08-04 DIAGNOSIS — J321 Chronic frontal sinusitis: Secondary | ICD-10-CM | POA: Diagnosis not present

## 2021-08-04 DIAGNOSIS — J320 Chronic maxillary sinusitis: Secondary | ICD-10-CM | POA: Diagnosis not present

## 2021-08-25 DIAGNOSIS — J321 Chronic frontal sinusitis: Secondary | ICD-10-CM | POA: Diagnosis not present

## 2021-08-25 DIAGNOSIS — J320 Chronic maxillary sinusitis: Secondary | ICD-10-CM | POA: Diagnosis not present

## 2021-08-25 DIAGNOSIS — J322 Chronic ethmoidal sinusitis: Secondary | ICD-10-CM | POA: Diagnosis not present

## 2021-08-25 DIAGNOSIS — J338 Other polyp of sinus: Secondary | ICD-10-CM | POA: Diagnosis not present

## 2021-09-05 ENCOUNTER — Other Ambulatory Visit (HOSPITAL_BASED_OUTPATIENT_CLINIC_OR_DEPARTMENT_OTHER): Payer: Self-pay

## 2021-09-05 MED ORDER — FLUOCINONIDE 0.05 % EX OINT
TOPICAL_OINTMENT | CUTANEOUS | 5 refills | Status: DC
  Filled 2021-09-05: qty 60, 30d supply, fill #0

## 2021-09-05 MED ORDER — MONTELUKAST SODIUM 10 MG PO TABS
ORAL_TABLET | ORAL | 3 refills | Status: DC
  Filled 2021-09-05: qty 90, 90d supply, fill #0

## 2021-09-05 MED ORDER — AMOXICILLIN 500 MG PO CAPS
ORAL_CAPSULE | ORAL | 0 refills | Status: DC
  Filled 2021-09-05: qty 16, 4d supply, fill #0

## 2021-09-05 MED ORDER — METHOCARBAMOL 500 MG PO TABS
ORAL_TABLET | ORAL | 2 refills | Status: DC
  Filled 2021-09-05: qty 40, 10d supply, fill #0
  Filled 2021-12-09: qty 40, 10d supply, fill #1
  Filled 2022-01-15: qty 40, 10d supply, fill #2

## 2021-09-15 ENCOUNTER — Other Ambulatory Visit (HOSPITAL_BASED_OUTPATIENT_CLINIC_OR_DEPARTMENT_OTHER): Payer: Self-pay

## 2021-09-15 MED ORDER — TRULICITY 1.5 MG/0.5ML ~~LOC~~ SOAJ
SUBCUTANEOUS | 3 refills | Status: DC
  Filled 2021-09-15: qty 6, 84d supply, fill #0
  Filled 2021-12-09 – 2021-12-22 (×2): qty 6, 84d supply, fill #1
  Filled 2022-03-24: qty 6, 84d supply, fill #2

## 2021-09-15 MED ORDER — GLIPIZIDE ER 10 MG PO TB24: ORAL_TABLET | ORAL | 3 refills | Status: DC

## 2021-09-15 MED ORDER — LOSARTAN POTASSIUM 50 MG PO TABS
ORAL_TABLET | ORAL | 3 refills | Status: DC
  Filled 2021-10-14: qty 90, fill #0

## 2021-09-16 ENCOUNTER — Other Ambulatory Visit: Payer: Self-pay

## 2021-10-02 NOTE — Progress Notes (Signed)
Sent message, via epic in basket, requesting orders in epic from surgeon.  

## 2021-10-04 ENCOUNTER — Other Ambulatory Visit: Payer: Self-pay | Admitting: Physician Assistant

## 2021-10-04 DIAGNOSIS — M1611 Unilateral primary osteoarthritis, right hip: Secondary | ICD-10-CM

## 2021-10-06 NOTE — Progress Notes (Addendum)
COVID swab appointment: 10-15-21 @ 9:45 AM  COVID Vaccine Completed: Yes x2 Date COVID Vaccine completed: 09-13-19 10-04-19 Has received booster: Yes x2 08-15-20 04-07-21 COVID vaccine manufacturer: [employer]      Date of COVID positive in last 90 days:  No  PCP - Kathryne Eriksson, MD Cardiologist - Daneen Schick, MD  Chest x-ray - CT chest 07-14-21  Epic EKG - 06-03-21 Epic Stress Test - greater than 2 years Epic ECHO - greater than 2 years Epic Cardiac Cath - greater than 2 years Epic Pacemaker/ICD device last checked: Spinal Cord Stimulator:  Sleep Study - Yes, neg sleep apnea CPAP - No  Fasting Blood Sugar -  120 to 150 range Checks Blood Sugar - 1- 2  times a week  Blood Thinner Instructions:  Prasugrel.  To stop 5- 7 days per patient Aspirin Instructions: Last Dose:  Activity level:   Can go up a flight of stairs and perform activities of daily living without stopping and without symptoms of chest pain or shortness of breath.    Anesthesia review: Severe aortic stenosis, CAD, DM, HTN.  H/o aortic valve replacement, CABG and MI  Patient denies shortness of breath, fever, cough and chest pain at PAT appointment  Patient verbalized understanding of instructions that were given to them at the PAT appointment. Patient was also instructed that they will need to review over the PAT instructions again at home before surgery.

## 2021-10-06 NOTE — Patient Instructions (Addendum)
DUE TO COVID-19 ONLY ONE VISITOR IS ALLOWED TO COME WITH YOU AND STAY IN THE WAITING ROOM ONLY DURING PRE OP AND PROCEDURE.   **NO VISITORS ARE ALLOWED IN THE SHORT STAY AREA OR RECOVERY ROOM!!**  IF YOU WILL BE ADMITTED INTO THE HOSPITAL YOU ARE ALLOWED ONLY TWO SUPPORT PEOPLE DURING VISITATION HOURS ONLY (7 AM -8PM)    Up to two visitors ages 34+ are allowed at one time in a patient's room.  The visitors may rotate out with other people throughout the day.  Additionally, up to two children between the ages of 7 and 93 are allowed and do not count toward the number of allowed visitors.  Children within this age range must be accompanied by an adult visitor.  One adult visitor may remain with the patient overnight and must be in the room by 8 PM.  COVID SWAB TESTING MUST BE COMPLETED ON:  10-15-21 @ 9:45 AM   COME IN THROUGH MAIN ENTRANCE of [facility].  Take a seat in the lobby area to the right as you come in the main entrance.  Call [phone]  and give your name and let them know you are here for COVID testing  You are not required to quarantine, however you are required to wear a well-fitted mask when you are out and around people not in your household.  Hand Hygiene often Do NOT share personal items Notify your provider if you are in close contact with someone who has COVID or you develop fever 100.4 or greater, new onset of sneezing, cough, sore throat, shortness of breath or body aches.   Your procedure is scheduled on: Friday, 10-17-21   Report to [facility] Main  Entrance     Report to admitting at 8:15 AM   Call this number if you have problems the morning of surgery [phone]   Do not eat food :After Midnight.   May have liquids until 8:00 AM day of surgery  CLEAR LIQUID DIET  Foods Allowed                                                                     Foods Excluded  Water, Black Coffee (no milk/no creamer) and tea, regular and decaf                               liquids that you cannot  Plain Jell-O in any flavor  (No red)                         see through such as: Fruit ices (not with fruit pulp)                                 milk, soups, orange juice  Iced Popsicles (No red)                                    All solid food  Apple juices Sports drinks like Gatorade (No red) Lightly seasoned clear broth or consume(fat free) Sugar     Complete one G2 drink the morning of surgery at 8:00 AM the day of surgery.    The day of surgery:  Drink ONE (1) Pre-Surgery G2  the morning of surgery. Drink in one sitting. Do not sip.  This drink was given to you during your hospital  pre-op appointment visit. Nothing else to drink after completing the Pre-Surgery G2           If you have questions, please contact your surgeons office.     Oral Hygiene is also important to reduce your risk of infection.                                    Remember - BRUSH YOUR TEETH THE MORNING OF SURGERY WITH YOUR REGULAR TOOTHPASTE   Do NOT smoke after Midnight  Take these medicines the morning of surgery with A SIP OF WATER:  Tylenol, Carvedilol, Zyrtec, Singulair, Sertraline  Prasugrel - to stop 5- 7 days   How to Manage Your Diabetes Before and After Surgery  Why is it important to control my blood sugar before and after surgery? Improving blood sugar levels before and after surgery helps healing and can limit problems. A way of improving blood sugar control is eating a healthy diet by:  Eating less sugar and carbohydrates  Increasing activity/exercise  Talking with your doctor about reaching your blood sugar goals High blood sugars (greater than 180 mg/dL) can raise your risk of infections and slow your recovery, so you will need to focus on controlling your diabetes during the weeks before surgery. Make sure that the doctor who takes care of your diabetes knows about your planned surgery including the date and  location.  How do I manage my blood sugar before surgery? Check your blood sugar at least 4 times a day, starting 2 days before surgery, to make sure that the level is not too high or low. Check your blood sugar the morning of your surgery when you wake up and every 2 hours until you get to the Short Stay unit. If your blood sugar is less than 70 mg/dL, you will need to treat for low blood sugar: Do not take insulin. Treat a low blood sugar (less than 70 mg/dL) with  cup of clear juice (cranberry or apple), 4 glucose tablets, OR glucose gel. Recheck blood sugar in 15 minutes after treatment (to make sure it is greater than 70 mg/dL). If your blood sugar is not greater than 70 mg/dL on recheck, call [phone] for further instructions. Report your blood sugar to the short stay nurse when you get to Short Stay.  If you are admitted to the hospital after surgery: Your blood sugar will be checked by the staff and you will probably be given insulin after surgery (instead of oral diabetes medicines) to make sure you have good blood sugar levels. The goal for blood sugar control after surgery is 80-180 mg/dL.   WHAT DO I DO ABOUT MY DIABETES MEDICATION?  Do not take oral diabetes medicines (pills) the morning of surgery.  THE DAY BEFORE SURGERY:  Do not take Glipizide), Metformin and Trulicity as prescribed.  THE MORNING OF SURGERY:  Do not take any diabetic medications.  Reviewed and Endorsed by [facility] Patient Education Committee, August 2015    Stop  all vitamins and herbal supplements a week before surgery             You may not have any metal on your body including hair pins, jewelry, and body piercing             Do not wear make-up, lotions, powders, perfumes or deodorant  Do not wear nail polish including gel and S&S, artificial/acrylic nails, or any other type of covering on natural nails including finger and toenails. If you have artificial nails, gel coating, etc. that  needs to be removed by a nail salon please have this removed prior to surgery or surgery may need to be canceled/ delayed if the surgeon/ anesthesia feels like they are unable to be safely monitored.   Do not shave  48 hours prior to surgery.       Do not bring valuables to the hospital. [place].   Contacts, dentures or bridgework may not be worn into surgery.   Bring small overnight bag day of surgery.    Special Instructions: Bring a copy of your healthcare power of attorney and living will documents the day of surgery if you haven't scanned them in before.  Please read over the following fact sheets you were given: IF YOU HAVE QUESTIONS ABOUT YOUR PRE OP INSTRUCTIONS PLEASE CALL [facility] - Preparing for Surgery Before surgery, you can play an important role.  Because skin is not sterile, your skin needs to be as free of germs as possible.  You can reduce the number of germs on your skin by washing with CHG (chlorahexidine gluconate) soap before surgery.  CHG is an antiseptic cleaner which kills germs and bonds with the skin to continue killing germs even after washing. Please DO NOT use if you have an allergy to CHG or antibacterial soaps.  If your skin becomes reddened/irritated stop using the CHG and inform your nurse when you arrive at Short Stay. Do not shave (including legs and underarms) for at least 48 hours prior to the first CHG shower.  You may shave your face/neck.  Please follow these instructions carefully:  1.  Shower with antibacterial soap the night before surgery and the  morning of surgery.  2.  If you choose to wash your hair, wash your hair first as usual with your normal  shampoo.  3.  After you shampoo, rinse your hair and body thoroughly to remove the shampoo.                           4.  Wash thoroughly, paying special attention to the area where your  surgery  will be performed.  5.  Thoroughly rinse your  body with warm water from the neck down.  6.   Pat yourself dry with a clean towel.             7.  Wear clean pajamas.             8.  Place clean sheets on your bed the night of your first shower and do not  sleep with pets. Day of Surgery : Do not apply any lotions/deodorants the morning of surgery.  Please wear clean clothes to the hospital/surgery center.  FAILURE TO FOLLOW THESE INSTRUCTIONS MAY RESULT IN THE CANCELLATION OF YOUR SURGERY  PATIENT SIGNATURE_________________________________  NURSE SIGNATURE__________________________________  ________________________________________________________________________   Adam Phenix  An incentive spirometer is a  tool that can help keep your lungs clear and active. This tool measures how well you are filling your lungs with each breath. Taking long deep breaths may help reverse or decrease the chance of developing breathing (pulmonary) problems (especially infection) following: A long period of time when you are unable to move or be active. BEFORE THE PROCEDURE  If the spirometer includes an indicator to show your best effort, your nurse or respiratory therapist will set it to a desired goal. If possible, sit up straight or lean slightly forward. Try not to slouch. Hold the incentive spirometer in an upright position. INSTRUCTIONS FOR USE  Sit on the edge of your bed if possible, or sit up as far as you can in bed or on a chair. Hold the incentive spirometer in an upright position. Breathe out normally. Place the mouthpiece in your mouth and seal your lips tightly around it. Breathe in slowly and as deeply as possible, raising the piston or the ball toward the top of the column. Hold your breath for 3-5 seconds or for as long as possible. Allow the piston or ball to fall to the bottom of the column. Remove the mouthpiece from your mouth and breathe out normally. Rest for a few seconds and repeat Steps 1 through 7 at least 10  times every 1-2 hours when you are awake. Take your time and take a few normal breaths between deep breaths. The spirometer may include an indicator to show your best effort. Use the indicator as a goal to work toward during each repetition. After each set of 10 deep breaths, practice coughing to be sure your lungs are clear. If you have an incision (the cut made at the time of surgery), support your incision when coughing by placing a pillow or rolled up towels firmly against it. Once you are able to get out of bed, walk around indoors and cough well. You may stop using the incentive spirometer when instructed by your caregiver.  RISKS AND COMPLICATIONS Take your time so you do not get dizzy or light-headed. If you are in pain, you may need to take or ask for pain medication before doing incentive spirometry. It is harder to take a deep breath if you are having pain. AFTER USE Rest and breathe slowly and easily. It can be helpful to keep track of a log of your progress. Your caregiver can provide you with a simple table to help with this. If you are using the spirometer at home, follow these instructions: [place] IF:  You are having difficultly using the spirometer. You have trouble using the spirometer as often as instructed. Your pain medication is not giving enough relief while using the spirometer. You develop fever of 100.5 F (38.1 C) or higher. SEEK IMMEDIATE MEDICAL CARE IF:  You cough up bloody sputum that had not been present before. You develop fever of 102 F (38.9 C) or greater. You develop worsening pain at or near the incision site. MAKE SURE YOU:  Understand these instructions. Will watch your condition. Will get help right away if you are not doing well or get worse. Document Released: 01/04/2007 Document Revised: 11/16/2011 Document Reviewed: 03/07/2007 ExitCare Patient Information 2014 ExitCare,  [place].   ________________________________________________________________________  WHAT IS A BLOOD TRANSFUSION? Blood Transfusion Information  A transfusion is the replacement of blood or some of its parts. Blood is made up of multiple cells which provide different functions. Red blood cells carry oxygen and are used for blood  loss replacement. White blood cells fight against infection. Platelets control bleeding. Plasma helps clot blood. Other blood products are available for specialized needs, such as hemophilia or other clotting disorders. BEFORE THE TRANSFUSION  Who gives blood for transfusions?  Healthy volunteers who are fully evaluated to make sure their blood is safe. This is blood bank blood. Transfusion therapy is the safest it has ever been in the practice of medicine. Before blood is taken from a donor, a complete history is taken to make sure that person has no history of diseases nor engages in risky social behavior (examples are intravenous drug use or sexual activity with multiple partners). The donor's travel history is screened to minimize risk of transmitting infections, such as malaria. The donated blood is tested for signs of infectious diseases, such as HIV and hepatitis. The blood is then tested to be sure it is compatible with you in order to minimize the chance of a transfusion reaction. If you or a relative donates blood, this is often done in anticipation of surgery and is not appropriate for emergency situations. It takes many days to process the donated blood. RISKS AND COMPLICATIONS Although transfusion therapy is very safe and saves many lives, the main dangers of transfusion include:  Getting an infectious disease. Developing a transfusion reaction. This is an allergic reaction to something in the blood you were given. Every precaution is taken to prevent this. The decision to have a blood transfusion has been considered carefully by your caregiver before blood is  given. Blood is not given unless the benefits outweigh the risks. AFTER THE TRANSFUSION Right after receiving a blood transfusion, you will usually feel much better and more energetic. This is especially true if your red blood cells have gotten low (anemic). The transfusion raises the level of the red blood cells which carry oxygen, and this usually causes an energy increase. The nurse administering the transfusion will monitor you carefully for complications. HOME CARE INSTRUCTIONS  No special instructions are needed after a transfusion. You may find your energy is better. Speak with your caregiver about any limitations on activity for underlying diseases you may have. SEEK MEDICAL CARE IF:  Your condition is not improving after your transfusion. You develop redness or irritation at the intravenous (IV) site. SEEK IMMEDIATE MEDICAL CARE IF:  Any of the following symptoms occur over the next 12 hours: Shaking chills. You have a temperature by mouth above 102 F (38.9 C), not controlled by medicine. Chest, back, or muscle pain. People around you feel you are not acting correctly or are confused. Shortness of breath or difficulty breathing. Dizziness and fainting. You get a rash or develop hives. You have a decrease in urine output. Your urine turns a dark color or changes to pink, red, or brown. Any of the following symptoms occur over the next 10 days: You have a temperature by mouth above 102 F (38.9 C), not controlled by medicine. Shortness of breath. Weakness after normal activity. The white part of the eye turns yellow (jaundice). You have a decrease in the amount of urine or are urinating less often. Your urine turns a dark color or changes to pink, red, or brown. Document Released: 08/21/2000 Document Revised: 11/16/2011 Document Reviewed: 04/09/2008 [facility] Patient Information 2014 [place], [place].  _______________________________________________________________________

## 2021-10-08 ENCOUNTER — Encounter (HOSPITAL_COMMUNITY): Payer: Self-pay

## 2021-10-08 ENCOUNTER — Other Ambulatory Visit: Payer: Self-pay

## 2021-10-08 ENCOUNTER — Encounter (HOSPITAL_COMMUNITY)
Admission: RE | Admit: 2021-10-08 | Discharge: 2021-10-08 | Disposition: A | Discharge status: Home / Self Care | Payer: PPO | Source: Ambulatory Visit | Attending: Orthopaedic Surgery | Admitting: Orthopaedic Surgery

## 2021-10-08 VITALS — BP 147/87 | HR 71 | Temp 98.3°F | Resp 18 | Ht 62.0 in | Wt 195.0 lb

## 2021-10-08 DIAGNOSIS — Z951 Presence of aortocoronary bypass graft: Secondary | ICD-10-CM | POA: Diagnosis not present

## 2021-10-08 DIAGNOSIS — Z953 Presence of xenogenic heart valve: Secondary | ICD-10-CM | POA: Insufficient documentation

## 2021-10-08 DIAGNOSIS — Z85118 Personal history of other malignant neoplasm of bronchus and lung: Secondary | ICD-10-CM | POA: Insufficient documentation

## 2021-10-08 DIAGNOSIS — I350 Nonrheumatic aortic (valve) stenosis: Secondary | ICD-10-CM | POA: Diagnosis not present

## 2021-10-08 DIAGNOSIS — Z01812 Encounter for preprocedural laboratory examination: Secondary | ICD-10-CM | POA: Insufficient documentation

## 2021-10-08 DIAGNOSIS — Z01818 Encounter for other preprocedural examination: Secondary | ICD-10-CM

## 2021-10-08 DIAGNOSIS — I2510 Atherosclerotic heart disease of native coronary artery without angina pectoris: Secondary | ICD-10-CM | POA: Diagnosis not present

## 2021-10-08 DIAGNOSIS — E119 Type 2 diabetes mellitus without complications: Secondary | ICD-10-CM | POA: Insufficient documentation

## 2021-10-08 DIAGNOSIS — M1611 Unilateral primary osteoarthritis, right hip: Secondary | ICD-10-CM | POA: Diagnosis not present

## 2021-10-08 LAB — BASIC METABOLIC PANEL
Anion gap: 8 (ref 5–15)
BUN: 15 mg/dL (ref 8–23)
CO2: 27 mmol/L (ref 22–32)
Calcium: 9.1 mg/dL (ref 8.9–10.3)
Chloride: 103 mmol/L (ref 98–111)
Creatinine, Ser: 0.85 mg/dL (ref 0.44–1.00)
GFR, Estimated: >60 mL/min (ref >60)
Glucose, Bld: 124 mg/dL — ABNORMAL HIGH (ref 70–99)
Potassium: 4.2 mmol/L (ref 3.5–5.1)
Sodium: 138 mmol/L (ref 135–145)

## 2021-10-08 LAB — CBC
HCT: 42.6 % (ref 36.0–46.0)
Hemoglobin: 13.9 g/dL (ref 12.0–15.0)
MCH: 29.6 pg (ref 26.0–34.0)
MCHC: 32.6 g/dL (ref 30.0–36.0)
MCV: 90.8 fL (ref 80.0–100.0)
Platelets: 238 10*3/uL (ref 150–400)
RBC: 4.69 MIL/uL (ref 3.87–5.11)
RDW: 12.8 % (ref 11.5–15.5)
WBC: 8.6 10*3/uL (ref 4.0–10.5)
nRBC: 0 % (ref 0.0–0.2)

## 2021-10-08 LAB — SURGICAL PCR SCREEN
MRSA, PCR: NEGATIVE
Staphylococcus aureus: NEGATIVE

## 2021-10-08 LAB — GLUCOSE, CAPILLARY: Glucose-Capillary: 135 mg/dL — ABNORMAL HIGH (ref 70–99)

## 2021-10-09 NOTE — Progress Notes (Addendum)
Anesthesia Chart Review:   Case: [id number] Date/Time: 10/17/21 1045   Procedure: TOTAL HIP ARTHROPLASTY ANTERIOR APPROACH (Right: Hip)   Anesthesia type: Spinal   Pre-op diagnosis: osteoarthritis right hip   Location: [place] [facility] / [facility]   Surgeons: Mcarthur Rossetti, MD       DISCUSSION: Pt is 66 years old with hx CAD (s/p CABG x 2 2019 (LIMA-LAD, reverse SVG-distal RCA; prior DES to RCA), aortic stenosis (s/p bioprosthetic AVR 2019), DM, lung cancer  Pt reported at pre-surgical testing she will stop prasugrel 5-7 days before OR; I spoke with pt by telephone and her last day of prasugrel is 10/09/21.   Pt asked if she needed to stop celebrex; per ASRA guidelines for neuraxial anesthesia, she does not need to hold celebrex; I advised her to check with Dr. [name] in case he prefers she hold it prior to OR.    VS: BP (!) 147/87    Pulse 71    Temp 36.8 C (Oral)    Resp 18    Ht 5\' 2"  (1.575 m)    Wt 88.5 kg    SpO2 100%    BMI 35.67 kg/m   PROVIDERS: - PCP is Christain [place], MD - Cardiologist it Daneen Schick, MD - Oncologist is Curt Bears, MD   LABS: Labs reviewed: Acceptable for surgery.  - A1c 09/05/21 was 7.6  (all labs ordered are listed, but only abnormal results are displayed)  Labs Reviewed  BASIC METABOLIC PANEL - Abnormal; Notable for the following components:      Result Value   Glucose, Bld 124 (*)    All other components within normal limits  GLUCOSE, CAPILLARY - Abnormal; Notable for the following components:   Glucose-Capillary 135 (*)    All other components within normal limits  SURGICAL PCR SCREEN  CBC  TYPE AND SCREEN     IMAGES: CT chest 07/15/21:  1. Stable postoperative changes of prior left upper lobe wedge resection with no findings to suggest local recurrence of disease or metastatic disease in the lungs. 2. Aortic atherosclerosis, in addition to left main and 3 vessel coronary artery disease. Status post median sternotomy for CABG  as well as aortic valve replacement. 3. Hepatic steatosis.   EKG 06/03/21: normal sinus rhythm, left anterior hemiblock, biatrial abnormality.  When compared to the prior tracing performed on May 31, 2020, axis deviation is new. This is related to left anterior hemiblock.  PR interval is also short   CV: Echo 05/22/19:  1. The left ventricle has normal systolic function with an ejection fraction of 60-65%. The cavity size was normal. There is mild concentric left ventricular hypertrophy. Left ventricular diastolic Doppler parameters are consistent with  pseudonormalization. Elevated left atrial and left ventricular  end-diastolic pressures The E/e' is 16. No evidence of left ventricular regional wall motion abnormalities.  2. Left atrial size was mildly dilated.  3. Mild thickening of the mitral valve leaflet. There is moderate mitral annular calcification present. No evidence of mitral valve stenosis.  4. A 45mm pericardial bioprosthesis valve is present in the aortic position. Procedure Date: 05/17/2018 Normal aortic valve prosthesis.  5. Unchanged from prior of the aortic valve.  6. The aorta is normal unless otherwise noted.  7. The aortic root, ascending aorta and aortic arch are normal in size and structure.    Cardiac cath 04/01/18 was pre-CABG   Past Medical History:  Diagnosis Date   Anginal pain ([place])    Aortic  stenosis    Coronary artery disease    Depression    "mild" (08/06/2016)   Dyspnea    Hyperlipidemia    lung ca dx'd 05/2018   Murmur, cardiac    Myocardial infarction [facility]) 2016   Osteoarthritis    "left hip and knee" (08/06/2016)   Type II diabetes mellitus ([place])     Past Surgical History:  Procedure Laterality Date   AORTIC VALVE REPLACEMENT N/A 05/17/2018   Procedure: AORTIC VALVE REPLACEMENT (AVR) USING 21 MM MAGNA EASE PERICARDIAL BIOPROSTHESIS. MODEL # [id number]. SERIAL # [id number];  Surgeon: Grace Isaac, MD;  Location: [place];  Service: Open  Heart Surgery;  Laterality: N/A;   CARDIAC CATHETERIZATION Right 11/30/2014   Procedure: RIGHT/LEFT HEART CATH AND CORONARY ANGIOGRAPHY;  Surgeon: Belva Crome, MD;  Location: [facility] CATH LAB;  Service: Cardiovascular;  Laterality: Right;   CARDIAC CATHETERIZATION N/A 12/13/2015   Procedure: Left Heart Cath and Coronary Angiography;  Surgeon: Leonie Man, MD;  Location: [place] CV LAB;  Service: Cardiovascular;  Laterality: N/A;   CARDIAC CATHETERIZATION N/A 12/13/2015   Procedure: Intravascular Pressure Wire/FFR Study;  Surgeon: Leonie Man, MD;  Location: [place] CV LAB;  Service: Cardiovascular;  Laterality: N/A;  RCA   CARDIAC CATHETERIZATION N/A 08/06/2016   Procedure: Right/Left Heart Cath and Coronary Angiography;  Surgeon: Lorretta Harp, MD;  Location: [place] CV LAB;  Service: Cardiovascular;  Laterality: N/A;   CARDIAC CATHETERIZATION N/A 08/06/2016   Procedure: Coronary Stent Intervention;  Surgeon: Lorretta Harp, MD;  Location: [place] CV LAB;  Service: Cardiovascular;  Laterality: N/A;   distal rca 3.0x16 and 3.0x8 synergy   [place] WITH STENT PLACEMENT  08/06/2016   "2 stents"   CORONARY ARTERY BYPASS GRAFT N/A 05/17/2018   Procedure: CORONARY ARTERY BYPASS GRAFTING (CABG) X 2 WITH ENDOSCOPIC HARVESTING OF RIGHT SAPHENOUS VEIN. LIMA TO LAD. SVG TO RCA;  Surgeon: Grace Isaac, MD;  Location: [place];  Service: Open Heart Surgery;  Laterality: N/A;   ETHMOIDECTOMY Right 07/28/2021   Procedure: ETHMOIDECTOMY;  Surgeon: Leta Baptist, MD;  Location: [place];  Service: ENT;  Laterality: Right;   FRONTAL SINUS EXPLORATION Right 07/28/2021   Procedure: FRONTAL SINUS EXPLORATION;  Surgeon: Leta Baptist, MD;  Location: [place];  Service: ENT;  Laterality: Right;   HYSTEROSCOPY DIAGNOSTIC  1990s   JOINT REPLACEMENT     KNEE ARTHROSCOPY Left 1996   MAXILLARY ANTROSTOMY Right 07/28/2021    Procedure: MAXILLARY ANTROSTOMY WITH TISSUE REMOVAL;  Surgeon: Leta Baptist, MD;  Location: [place];  Service: ENT;  Laterality: Right;   [place] Right 07/28/2021   Procedure: ENDOSCOPIC SINUS SURGERY WITH STEALTH NAVIGATION;  Surgeon: Leta Baptist, MD;  Location: [place];  Service: ENT;  Laterality: Right;   RIGHT/LEFT HEART CATH AND CORONARY ANGIOGRAPHY N/A 04/01/2018   Procedure: RIGHT/LEFT HEART CATH AND CORONARY ANGIOGRAPHY;  Surgeon: Belva Crome, MD;  Location: [place] CV LAB;  Service: Cardiovascular;  Laterality: N/A;   TEE WITHOUT CARDIOVERSION N/A 05/17/2018   Procedure: TRANSESOPHAGEAL ECHOCARDIOGRAM (TEE);  Surgeon: Grace Isaac, MD;  Location: [place];  Service: Open Heart Surgery;  Laterality: N/A;   TOTAL HIP ARTHROPLASTY Left 04/23/2017   Procedure: LEFT TOTAL HIP ARTHROPLASTY ANTERIOR APPROACH;  Surgeon: Mcarthur Rossetti, MD;  [facility];  Service: Orthopedics;  Laterality: Left;  WEDGE RESECTION Left 05/17/2018   Procedure: LEFT UPPER LOBE LUNG WEDGE RESECTION;  Surgeon: Grace Isaac, MD;  Location: [place];  Service: Open Heart Surgery;  Laterality: Left;    MEDICATIONS:  Acetaminophen (TYLENOL DISSOLVE PACKS) 500 MG PACK   acetaminophen (TYLENOL) 325 MG tablet   amoxicillin (AMOXIL) 500 MG capsule   carvedilol (COREG) 6.25 MG tablet   celecoxib (CELEBREX) 200 MG capsule   cetirizine (ZYRTEC) 10 MG tablet   COLLAGEN PO   Dulaglutide (TRULICITY) 3.89 HT/3.4KA SOPN   Dulaglutide (TRULICITY) 1.5 JG/8.1LX SOPN   Evolocumab (REPATHA SURECLICK) 726 MG/ML SOAJ   famotidine (PEPCID) 20 MG tablet   fluocinonide ointment (LIDEX) 0.05 %   fluticasone (FLONASE) 50 MCG/ACT nasal spray   glipiZIDE (GLUCOTROL XL) 10 MG 24 hr tablet   glipiZIDE (GLUCOTROL XL) 10 MG 24 hr tablet   glucose blood (ONETOUCH ULTRA) test strip   losartan (COZAAR) 50 MG tablet   losartan (COZAAR) 50 MG tablet    Menthol-Methyl Salicylate (MUSCLE RUB EX)   metFORMIN (GLUCOPHAGE) 850 MG tablet   methocarbamol (ROBAXIN) 500 MG tablet   montelukast (SINGULAIR) 10 MG tablet   montelukast (SINGULAIR) 10 MG tablet   prasugrel (EFFIENT) 10 MG TABS tablet   Propylene Glycol (SYSTANE COMPLETE) 0.6 % SOLN   sertraline (ZOLOFT) 50 MG tablet   triamcinolone cream (KENALOG) 0.1 %   No current facility-administered medications for this encounter.    If no changes, I anticipate pt can proceed with surgery as scheduled.   Willeen Cass, PhD, FNP-BC [facility] Short Stay Surgical [facility]/Anesthesiology Phone: [phone] 10/09/2021 1:07 PM

## 2021-10-09 NOTE — Anesthesia Preprocedure Evaluation (Deleted)
Anesthesia Evaluation  Patient identified by MRN, date of birth, ID band Patient awake    Reviewed: Allergy & Precautions, NPO status , Patient's Chart, lab work & pertinent test results  Airway Mallampati: II  TM Distance: >3 FB Neck ROM: Full    Dental no notable dental hx.    Pulmonary neg pulmonary ROS, former smoker,    Pulmonary exam normal breath sounds clear to auscultation       Cardiovascular + CAD, + Past MI and + CABG  Normal cardiovascular exam Rhythm:Regular Rate:Normal     Neuro/Psych Depression negative neurological ROS  negative psych ROS   GI/Hepatic negative GI ROS, Neg liver ROS,   Endo/Other  negative endocrine ROSdiabetes, Type 2  Renal/GU negative Renal ROS  negative genitourinary   Musculoskeletal  (+) Arthritis , Osteoarthritis,    Abdominal (+) + obese,   Peds negative pediatric ROS (+)  Hematology negative hematology ROS (+)   Anesthesia Other Findings   Reproductive/Obstetrics negative OB ROS                                                             Anesthesia Evaluation  Patient identified by MRN, date of birth, ID band Patient awake    Reviewed: Allergy & Precautions, NPO status , Patient's Chart, lab work & pertinent test results  Airway Mallampati: II       Dental no notable dental hx. (+) Teeth Intact   Pulmonary former smoker,    Pulmonary exam normal        Cardiovascular + CAD and + CABG  Normal cardiovascular exam+ Valvular Problems/Murmurs      Neuro/Psych PSYCHIATRIC DISORDERS Depression    GI/Hepatic negative GI ROS, Neg liver ROS,   Endo/Other  diabetes, Type 2, Oral Hypoglycemic Agents  Renal/GU negative Renal ROS     Musculoskeletal  (+) Arthritis , Osteoarthritis,    Abdominal (+) + obese,   Peds  Hematology negative hematology ROS (+)   Anesthesia Other Findings IMPRESSIONS    1. The left  ventricle has normal systolic function with an ejection  fraction of 60-65%. The cavity size was normal. There is mild concentric  left ventricular hypertrophy. Left ventricular diastolic Doppler  parameters are consistent with  pseudonormalization. Elevated left atrial and left ventricular  end-diastolic pressures The E/e' is 16. No evidence of left ventricular  regional wall motion abnormalities.  2. Left atrial size was mildly dilated.  3. Mild thickening of the mitral valve leaflet. There is moderate mitral  annular calcification present. No evidence of mitral valve stenosis.  4. A 27mm pericardial bioprosthesis valve is present in the aortic  position. Procedure Date: 05/17/2018 Normal aortic valve prosthesis.  5. Unchanged from prior of the aortic valve.  6. The aorta is normal unless otherwise noted.  7. The aortic root, ascending aorta and aortic arch are normal in size  and structure.   SUMMARY    Normal LV systolic function with grade 2 diastolic dysfunction and  E/e' of 16 suggesting elevated filling pressures. AVR is well  seated, no regurgitation, gradient is the same as compared to prior  study.  FINDINGS  Left Ventricle: The left ventricle has normal systolic function, with an  ejection fraction of 60-65%. The cavity size was normal. There is mild  concentric  left ventricular hypertrophy. Left ventricular diastolic  Doppler parameters are consistent with  pseudonormalization. Elevated left atrial and left ventricular  end-diastolic pressures The E/e' is 16. No evidence of left ventricular  regional wall motion abnormalities..   Right Ventricle: The right ventricle has low normal systolic function. The  cavity was normal. There is no increase in right ventricular wall  thickness. Right ventricular systolic pressure is normal with an estimated  pressure of 16.5 mmHg.   Left Atrium: Left atrial size was mildly dilated.   Right Atrium: Right atrial size was  normal in size. Right atrial pressure  is estimated at 3 mmHg.   Interatrial Septum: No atrial level shunt detected by color flow Doppler.   Pericardium: There is no evidence of pericardial effusion.   Mitral Valve: The mitral valve is normal in structure. Mild thickening of  the mitral valve leaflet. There is moderate mitral annular calcification  present. Mitral valve regurgitation is trivial by color flow Doppler. No  evidence of mitral valve stenosis.   Tricuspid Valve: The tricuspid valve is normal in structure. Tricuspid  valve regurgitation is trivial by color flow Doppler.   Aortic Valve: The aortic valve has been repaired/replaced Aortic valve  regurgitation was not visualized by color flow Doppler. There is unchanged  from prior of the aortic valve, with a calculated valve area of 0.91 cm.  A 55mm pericardial aortic valve  bioprosthesis valve is present in the aortic position. Procedure Date:  05/17/2018 Normal aortic valve prosthesis.   Pulmonic Valve: The pulmonic valve was grossly normal. Pulmonic valve  regurgitation is not visualized by color flow Doppler. No evidence of  pulmonic stenosis.   Aorta: The aortic root, ascending aorta and aortic arch are normal in size  and structure. The aorta is normal unless otherwise noted.   Pulmonary Artery: The pulmonary artery is not well seen.   Venous: The inferior vena cava is normal in size with greater than 50%  respiratory variability.     +--------------+--------++   LEFT VENTRICLE        +----------------+---------++  +--------------+--------++  Diastology              PLAX 2D            +----------------+---------++  +--------------+--------++  LV e' lateral:  6.64 cm/s     LVIDd:     4.46 cm    +----------------+---------++  +--------------+--------++  LV E/e' lateral: 14.8        LVIDs:     2.82 cm    +----------------+---------++  +--------------+--------++  LV e' medial:    5.55 cm/s     LV PW:     1.24 cm    +----------------+---------++  +--------------+--------++  LV E/e' medial:  17.7        LV IVS:     1.19 cm    +----------------+---------++  +--------------+--------++   LVOT diam:   1.70 cm     +--------------+--------++   LV SV:     60 ml      +--------------+--------++   LV SV Index:  29.69      +--------------+--------++   LVOT Area:   2.27 cm    +--------------+--------++                   +--------------+--------++   +---------------+---------++   RIGHT VENTRICLE          +---------------+---------++   RV Basal diam:  3.01 cm     +---------------+---------++   RV S prime:  9.90 cm/s    +---------------+---------++   TAPSE (M-mode): 1.5 cm      +---------------+---------++   RVSP:      16.5 mmHg    +---------------+---------++   +---------------+-------++-----------++   LEFT ATRIUM         Index       +---------------+-------++-----------++   LA diam:     4.60 cm  2.41 cm/m     +---------------+-------++-----------++   LA Vol (A2C):  60.6 ml  31.70 ml/m    +---------------+-------++-----------++   LA Vol (A4C):  60.6 ml  31.70 ml/m    +---------------+-------++-----------++   LA Biplane Vol: 64.6 ml  33.79 ml/m    +---------------+-------++-----------++  +------------+---------++-----------++   RIGHT ATRIUM        Index       +------------+---------++-----------++   RA Pressure: 3.00 mmHg            +------------+---------++-----------++   RA Area:   16.20 cm            +------------+---------++-----------++   RA Volume:  44.00 ml   23.02 ml/m    +------------+---------++-----------++  +------------------+------------++   AORTIC VALVE              +------------------+------------++   AV Area (Vmax):   0.85 cm      +------------------+------------++   AV Area (Vmean):  0.88 cm      +------------------+------------++    AV Area (VTI):   0.91 cm      +------------------+------------++   AV Vmax:      272.00 cm/s     +------------------+------------++   AV Vmean:      197.000 cm/s    +------------------+------------++   AV VTI:       0.638 m       +------------------+------------++   AV Peak Grad:    29.6 mmHg      +------------------+------------++   AV Mean Grad:    17.0 mmHg      +------------------+------------++   LVOT Vmax:     102.00 cm/s     +------------------+------------++   LVOT Vmean:     76.800 cm/s     +------------------+------------++   LVOT VTI:      0.257 m       +------------------+------------++   LVOT/AV VTI ratio: 0.40        +------------------+------------++    +-------------+-------++   AORTA             +-------------+-------++   Ao Root diam: 2.40 cm    +-------------+-------++   +--------------+--------++  +---------------+-----------++   MITRAL VALVE           TRICUSPID VALVE           +--------------+--------++  +---------------+-----------++   MV Area (PHT):          TR Peak grad:  13.5 mmHg     +--------------+--------++  +---------------+-----------++   MV PHT:              TR Vmax:     217.00 cm/s    +--------------+--------++  +---------------+-----------++   MV Decel Time: 208 msec     Estimated RAP:  3.00 mmHg     +--------------+--------++  +---------------+-----------++  +--------------+-----------++  RVSP:      16.5 mmHg      MV E velocity: 98.50 cm/s    +---------------+-----------++  +--------------+-----------++   MV A velocity: 110.00 cm/s   +--------------+-------+  +--------------+-----------++  SHUNTS             MV  E/A ratio:  0.90       +--------------+-------+  +--------------+-----------++  Systemic VTI:  0.26 m                  +--------------+-------+                 Systemic Diam: 1.70 cm                  +--------------+-------+     Buford Dresser MD  Electronically signed by Buford Dresser MD  Signature Date/Time: 05/22/2019/7:40:54 PM      Final   Imaging Info  ECHOCARDIOGRAM COMPLETE (Order #[id number]) on 05/22/19  Study History  ECHOCARDIOGRAM COMPLETE (Order #[id number]) on 05/22/19  Syngo Images  Show images for ECHOCARDIOGRAM COMPLETE Images on [employer] Storage  Show images for Arriah, Wadle "[place]" Performing Technologist/Nurse  Performing Technologist/Nurse: Trey Paula Reason for Exam Priority: Routine Dx: H/O aortic valve replacement (Z95.2 (ICD-10-CM))  Surgical History  Surgical History   Procedure Laterality Date Comment Source CARDIAC CATHETERIZATION Right 11/30/2014 Procedure: RIGHT/LEFT HEART CATH AND CORONARY ANGIOGRAPHY; Surgeon: Belva Crome, MD; Location: [facility]; Service: Cardiovascular; Laterality: Right;  CARDIAC CATHETERIZATION N/A 12/13/2015 Procedure: Left Heart Cath and Coronary Angiography; Surgeon: Leonie Man, MD; Location: [place] [facility]; Service: Cardiovascular; Laterality: N/A;  CARDIAC CATHETERIZATION N/A 12/13/2015 Procedure: Intravascular Pressure Wire/FFR Study; Surgeon: Leonie Man, MD; Location: [place] [facility]; Service: Cardiovascular; Laterality: N/A; RCA  CARDIAC CATHETERIZATION N/A 08/06/2016 Procedure: Right/Left Heart Cath and Coronary Angiography; Surgeon: Lorretta Harp, MD; Location: [place] [facility]; Service: Cardiovascular; Laterality: N/A;  CARDIAC CATHETERIZATION N/A 08/06/2016 Procedure: Coronary Stent Intervention; Surgeon: Lorretta Harp, MD; Location: [place] [facility]; Service: Cardiovascular; Laterality: N/A;  distal rca 3.0x16 and 3.0x8 synergy  CORONARY ANGIOPLASTY WITH STENT PLACEMENT  08/06/2016 "2 stents"  CORONARY ARTERY BYPASS GRAFT N/A 05/17/2018 Procedure: CORONARY ARTERY BYPASS GRAFTING (CABG) X 2  WITH ENDOSCOPIC HARVESTING OF RIGHT SAPHENOUS VEIN. LIMA TO LAD. SVG TO RCA; Surgeon: Grace Isaac, MD; Location: [place]; Service: Open Heart Surgery; Laterality: N/A;   Other Surgical History   Procedure Laterality Date Comment Source AORTIC VALVE REPLACEMENT N/A 05/17/2018 Procedure: AORTIC VALVE REPLACEMENT (AVR) USING 21 MM MAGNA EASE PERICARDIAL BIOPROSTHESIS. MODEL # [id number]. SERIAL # [id number]; Surgeon: Grace Isaac, MD; Location: [place]; Service: Open Heart Surgery; Laterality: N/A;  CESAREAN SECTION  1989   HYSTEROSCOPY DIAGNOSTIC  1990s   JOINT REPLACEMENT     KNEE ARTHROSCOPY Left 1996   NASAL SINUS SURGERY  1977   RIGHT/LEFT HEART CATH AND CORONARY ANGIOGRAPHY N/A 04/01/2018 Procedure: RIGHT/LEFT HEART CATH AND CORONARY ANGIOGRAPHY; Surgeon: Belva Crome, MD; Location: [place] [facility]; Service: Cardiovascular; Laterality: N/A;  TEE WITHOUT CARDIOVERSION N/A 05/17/2018 Procedure: TRANSESOPHAGEAL ECHOCARDIOGRAM (TEE); Surgeon: Grace Isaac, MD; Location: [place]; Service: Open Heart Surgery; Laterality: N/A;  TOTAL HIP ARTHROPLASTY Left 04/23/2017 Procedure: LEFT TOTAL HIP ARTHROPLASTY ANTERIOR APPROACH; Surgeon: Mcarthur Rossetti, MD; Location: [facility]; Service: Orthopedics; Laterality: Left;  WEDGE RESECTION Left 05/17/2018 Procedure: LEFT UPPER LOBE LUNG WEDGE RESECTION; Surgeon: Grace Isaac, MD; Location: [place]; Service: Open Heart Surgery; Laterality: Left;    Implants  Permanent Stent Stent Synergy Des 3x16 - [id number] - Implanted Dist RCA  Inventory item: Melissa Noon DES 3X16 Model/Cat number: [id number] Manufacturer: [employer] Lot number: [id number] Device identifier: [id number] Device identifier type: GS1 Area Of Implantation: Dist RCA   As of 08/06/2016  Status: Implanted  Stent Synergy Des 3x8 [id number] - Implanted Mid RCA  Inventory item: STENT SYNERGY DES 3X8 Model/Cat  number: [id number] Manufacturer: [employer] Lot number: [id number] Device identifier: [id number] Device identifier type: GS1 Area Of Implantation: Mid RCA   As of 08/06/2016  Status: Implanted    Prosthesis/Implant Heart Valve Magna Ease 34mm - [id number] - Implanted Heart  Inventory item: VALVE MAGNA EASE 21MM Model/Cat number: [id number] Serial number: [id number] Manufacturer: [employer] Area Of Implantation: Heart   As of 05/17/2018  Status: Implanted    Type Not Specified Capt Hip Total 2 - [id number] - Capitated Charge Entry (Left) Hip  Inventory item: CAPT HIP TOTAL 2 Model/Cat number: [id number] Manufacturer: [employer]   As of 04/23/2017  Status: Capitated Charge Entry    ? Calcific aortic stenosis, moderately severe with calculated aortic valve area 0.92 cm.  Peak to peak gradient 45 mmHg. ? Two-vessel coronary artery disease with 70% mid LAD stenosis and 50% in-stent restenosis in the right coronary within the region of overlapping double layer stent. ? Widely patent circumflex artery. ? Normal left main. ? Normal pulmonary pressures and left ventricular hemodynamics. ? Limiting exertional dyspnea secondary to aortic stenosis predominantly with contribution from CAD.  Recommendations:  ? Aortic valve replacement ? LIMA to LAD and SVG to RCA ? Patient has an appointment to see Dr. Servando Snare.     Reproductive/Obstetrics                            Anesthesia Physical Anesthesia Plan  ASA: 3  Anesthesia Plan: General   Post-op Pain Management:    Induction: Intravenous  PONV Risk Score and Plan: 4 or greater and Ondansetron, Midazolam and Treatment may vary due to age or medical condition  Airway Management Planned: Oral ETT  Additional Equipment: None  Intra-op Plan:   Post-operative Plan: Extubation in OR  Informed Consent: I have reviewed the patients History and Physical, chart, labs  and discussed the procedure including the risks, benefits and alternatives for the proposed anesthesia with the patient or authorized representative who has indicated his/her understanding and acceptance.     Dental advisory given  Plan Discussed with: CRNA  Anesthesia Plan Comments:         Anesthesia Quick Evaluation  Anesthesia Physical Anesthesia Plan  ASA: 3  Anesthesia Plan: Spinal   Post-op Pain Management:    Induction: Intravenous  PONV Risk Score and Plan: 2 and Ondansetron, Midazolam and Treatment may vary due to age or medical condition  Airway Management Planned: Natural Airway  Additional Equipment:   Intra-op Plan:   Post-operative Plan:   Informed Consent: I have reviewed the patients History and Physical, chart, labs and discussed the procedure including the risks, benefits and alternatives for the proposed anesthesia with the patient or authorized representative who has indicated his/her understanding and acceptance.     Dental advisory given  Plan Discussed with: CRNA  Anesthesia Plan Comments: (See APP note by Durel Salts, FNP )       Anesthesia Quick Evaluation

## 2021-10-14 ENCOUNTER — Other Ambulatory Visit (HOSPITAL_BASED_OUTPATIENT_CLINIC_OR_DEPARTMENT_OTHER): Payer: Self-pay

## 2021-10-14 ENCOUNTER — Other Ambulatory Visit: Payer: Self-pay | Admitting: Interventional Cardiology

## 2021-10-14 MED ORDER — PRASUGREL HCL 10 MG PO TABS
10.0000 mg | ORAL_TABLET | Freq: Every day | ORAL | 2 refills | Status: DC
  Filled 2021-10-14: qty 90, 90d supply, fill #0
  Filled 2022-01-15: qty 90, 90d supply, fill #1
  Filled 2022-04-21: qty 90, 90d supply, fill #2

## 2021-10-15 ENCOUNTER — Other Ambulatory Visit (HOSPITAL_BASED_OUTPATIENT_CLINIC_OR_DEPARTMENT_OTHER): Payer: Self-pay

## 2021-10-15 ENCOUNTER — Encounter (HOSPITAL_COMMUNITY)
Admission: RE | Admit: 2021-10-15 | Discharge: 2021-10-15 | Disposition: A | Discharge status: Home / Self Care | Payer: PPO | Source: Ambulatory Visit | Attending: Orthopaedic Surgery | Admitting: Orthopaedic Surgery

## 2021-10-15 DIAGNOSIS — Z96642 Presence of left artificial hip joint: Secondary | ICD-10-CM | POA: Diagnosis not present

## 2021-10-15 DIAGNOSIS — Z7985 Long-term (current) use of injectable non-insulin antidiabetic drugs: Secondary | ICD-10-CM | POA: Diagnosis not present

## 2021-10-15 DIAGNOSIS — M1611 Unilateral primary osteoarthritis, right hip: Secondary | ICD-10-CM | POA: Diagnosis present

## 2021-10-15 DIAGNOSIS — I2510 Atherosclerotic heart disease of native coronary artery without angina pectoris: Secondary | ICD-10-CM | POA: Diagnosis not present

## 2021-10-15 DIAGNOSIS — Z7984 Long term (current) use of oral hypoglycemic drugs: Secondary | ICD-10-CM | POA: Diagnosis not present

## 2021-10-15 DIAGNOSIS — Z20822 Contact with and (suspected) exposure to covid-19: Secondary | ICD-10-CM | POA: Diagnosis not present

## 2021-10-15 DIAGNOSIS — Z951 Presence of aortocoronary bypass graft: Secondary | ICD-10-CM | POA: Diagnosis not present

## 2021-10-15 DIAGNOSIS — E119 Type 2 diabetes mellitus without complications: Secondary | ICD-10-CM | POA: Diagnosis not present

## 2021-10-15 DIAGNOSIS — Z79899 Other long term (current) drug therapy: Secondary | ICD-10-CM | POA: Diagnosis not present

## 2021-10-15 DIAGNOSIS — Z87891 Personal history of nicotine dependence: Secondary | ICD-10-CM | POA: Diagnosis not present

## 2021-10-15 DIAGNOSIS — Z85118 Personal history of other malignant neoplasm of bronchus and lung: Secondary | ICD-10-CM | POA: Diagnosis not present

## 2021-10-15 LAB — SARS CORONAVIRUS 2 (TAT 6-24 HRS): SARS Coronavirus 2: NEGATIVE

## 2021-10-16 NOTE — H&P (Signed)
TOTAL HIP ADMISSION H&P  Patient is admitted for right total hip arthroplasty.  Subjective:  Chief Complaint: right hip pain  HPI: Sarah Stevenson, 66 y.o. female, has a history of pain and functional disability in the right hip(s) due to arthritis and patient has failed non-surgical conservative treatments for greater than 12 weeks to include NSAID's and/or analgesics, flexibility and strengthening excercises, use of assistive devices, and activity modification.  Onset of symptoms was gradual starting 1 years ago with gradually worsening course since that time.The patient noted no past surgery on the right hip(s).  Patient currently rates pain in the right hip at 9 out of 10 with activity. Patient has night pain, worsening of pain with activity and weight bearing, pain that interfers with activities of daily living, and pain with passive range of motion. Patient has evidence of subchondral sclerosis, periarticular osteophytes, and joint space narrowing by imaging studies. This condition presents safety issues increasing the risk of fall.  There is no current active infection.  Patient Active Problem List   Diagnosis Date Noted   H/O aortic valve replacement 07/03/2018   Adenocarcinoma of lung, left ([place]) 05/20/2018   Coronary artery disease involving coronary bypass graft of native heart with angina pectoris ([place]) 05/17/2018   DOE (dyspnea on exertion)    Trochanteric bursitis, right hip 12/01/2017   Unilateral primary osteoarthritis, right hip 12/01/2017   Status post total replacement of left hip 04/23/2017   Pain in left hip 03/31/2017   Unilateral primary osteoarthritis, left hip 03/31/2017   Sciatica, left side 03/31/2017   Severe aortic stenosis    Positive cardiac stress test    Trochanteric bursitis of left hip 08/14/2015   Arthritis of left hip 08/14/2015   Excessive daytime sleepiness 03/03/2015   Snoring 12/20/2014   Type 2 diabetes mellitus with complication ([place])     Hyperlipidemia    Past Medical History:  Diagnosis Date   Anginal pain ([place])    Aortic stenosis    Coronary artery disease    Depression    "mild" (08/06/2016)   Dyspnea    Hyperlipidemia    lung ca dx'd 05/2018   Murmur, cardiac    Myocardial infarction ([place]) 2016   Osteoarthritis    "left hip and knee" (08/06/2016)   Type II diabetes mellitus ([place])     Past Surgical History:  Procedure Laterality Date   AORTIC VALVE REPLACEMENT N/A 05/17/2018   Procedure: AORTIC VALVE REPLACEMENT (AVR) USING 21 MM MAGNA EASE PERICARDIAL BIOPROSTHESIS. MODEL # [id number]. SERIAL # [id number];  Surgeon: Grace Isaac, MD;  Location: [facility];  Service: Open Heart Surgery;  Laterality: N/A;   CARDIAC CATHETERIZATION Right 11/30/2014   Procedure: RIGHT/LEFT HEART CATH AND CORONARY ANGIOGRAPHY;  Surgeon: Belva Crome, MD;  Location: [facility];  Service: Cardiovascular;  Laterality: Right;   CARDIAC CATHETERIZATION N/A 12/13/2015   Procedure: Left Heart Cath and Coronary Angiography;  Surgeon: Leonie Man, MD;  Location: [facility] CV [facility];  Service: Cardiovascular;  Laterality: N/A;   CARDIAC CATHETERIZATION N/A 12/13/2015   Procedure: Intravascular Pressure Wire/FFR Study;  Surgeon: Leonie Man, MD;  Location: [facility];  Service: Cardiovascular;  Laterality: N/A;  RCA   CARDIAC CATHETERIZATION N/A 08/06/2016   Procedure: Right/Left Heart Cath and Coronary Angiography;  Surgeon: Lorretta Harp, MD;  Location: [facility];  Service: Cardiovascular;  Laterality: N/A;   CARDIAC CATHETERIZATION N/A 08/06/2016   Procedure: Coronary Stent Intervention;  Surgeon: Pearletha Forge  Gwenlyn Found, MD;  Location: [facility];  Service: Cardiovascular;  Laterality: N/A;   distal rca 3.0x16 and 3.0x8 synergy   [place] WITH STENT PLACEMENT  08/06/2016   "2 stents"   CORONARY ARTERY BYPASS GRAFT N/A 05/17/2018   Procedure: CORONARY ARTERY BYPASS GRAFTING  (CABG) X 2 WITH ENDOSCOPIC HARVESTING OF RIGHT SAPHENOUS VEIN. LIMA TO LAD. SVG TO RCA;  Surgeon: Grace Isaac, MD;  Location: [facility];  Service: Open Heart Surgery;  Laterality: N/A;   ETHMOIDECTOMY Right 07/28/2021   Procedure: ETHMOIDECTOMY;  Surgeon: Leta Baptist, MD;  Location: [facility];  Service: ENT;  Laterality: Right;   FRONTAL SINUS EXPLORATION Right 07/28/2021   Procedure: FRONTAL SINUS EXPLORATION;  Surgeon: Leta Baptist, MD;  Location: [facility];  Service: ENT;  Laterality: Right;   HYSTEROSCOPY DIAGNOSTIC  1990s   JOINT REPLACEMENT     KNEE ARTHROSCOPY Left 1996   MAXILLARY ANTROSTOMY Right 07/28/2021   Procedure: MAXILLARY ANTROSTOMY WITH TISSUE REMOVAL;  Surgeon: Leta Baptist, MD;  Location: [facility];  Service: ENT;  Laterality: Right;   [place] Right 07/28/2021   Procedure: ENDOSCOPIC SINUS SURGERY WITH STEALTH NAVIGATION;  Surgeon: Leta Baptist, MD;  Location: [facility];  Service: ENT;  Laterality: Right;   RIGHT/LEFT HEART CATH AND CORONARY ANGIOGRAPHY N/A 04/01/2018   Procedure: RIGHT/LEFT HEART CATH AND CORONARY ANGIOGRAPHY;  Surgeon: Belva Crome, MD;  Location: [facility];  Service: Cardiovascular;  Laterality: N/A;   TEE WITHOUT CARDIOVERSION N/A 05/17/2018   Procedure: TRANSESOPHAGEAL ECHOCARDIOGRAM (TEE);  Surgeon: Grace Isaac, MD;  Location: [facility];  Service: Open Heart Surgery;  Laterality: N/A;   TOTAL HIP ARTHROPLASTY Left 04/23/2017   Procedure: LEFT TOTAL HIP ARTHROPLASTY ANTERIOR APPROACH;  Surgeon: Mcarthur Rossetti, MD;  Location: [facility];  Service: Orthopedics;  Laterality: Left;   WEDGE RESECTION Left 05/17/2018   Procedure: LEFT UPPER LOBE LUNG WEDGE RESECTION;  Surgeon: Grace Isaac, MD;  Location: [facility];  Service: Open Heart Surgery;  Laterality: Left;    No current facility-administered medications for this encounter.   Current  Outpatient Medications  Medication Sig Dispense Refill Last Dose   Acetaminophen (TYLENOL DISSOLVE PACKS) 500 MG PACK Take 1,000 mg by mouth every 6 (six) hours as needed (pain).      acetaminophen (TYLENOL) 325 MG tablet Take 650 mg by mouth every 6 (six) hours as needed for moderate pain.      amoxicillin (AMOXIL) 500 MG capsule Take 4 capsules by mouth 1 hour before appointment (Patient taking differently: Take 2,000 mg by mouth See admin instructions. Take 2000 mg 1 hour prior to dental work) 16 capsule 0    carvedilol (COREG) 6.25 MG tablet TAKE 1 TABLET (6.25 MG TOTAL) BY MOUTH 2 (TWO) TIMES DAILY WITH A MEAL. 180 tablet 1    celecoxib (CELEBREX) 200 MG capsule Take one capsule by mouth daily as needed for leg pain 90 capsule 3    cetirizine (ZYRTEC) 10 MG tablet Take 10 mg by mouth daily.      COLLAGEN PO Take 1 Scoop by mouth daily. With biotin and vitamin c      Dulaglutide (TRULICITY) 1.74 YC/1.4GY SOPN Inject 0.75 mg into the skin every Wednesday.      Evolocumab (REPATHA SURECLICK) 185 MG/ML SOAJ INJECT 140 MG INTO THE SKIN EVERY 14 DAYS 6 mL 3  famotidine (PEPCID) 20 MG tablet Take 20 mg by mouth at bedtime.      fluocinonide ointment (LIDEX) 0.05 % Apply to itchy lesions twice a day. (Patient taking differently: Apply 1 application topically 2 (two) times daily as needed (rash).) 60 g 5    fluticasone (FLONASE) 50 MCG/ACT nasal spray Place 1 spray into both nostrils daily.      glipiZIDE (GLUCOTROL XL) 10 MG 24 hr tablet TAKE 1 TABLET BY MOUTH DAILY FOR DIABETES CONTROL. 90 tablet 3    losartan (COZAAR) 50 MG tablet TAKE 1 TABLET BY MOUTH DAILY TO PRESERVE KIDNEY FUNCTION. 90 tablet 3    Menthol-Methyl Salicylate (MUSCLE RUB EX) Apply 1 application topically as needed (for pain).      metFORMIN (GLUCOPHAGE) 850 MG tablet Take 1 tablet (850 mg total) by mouth 2 (two) times daily to control diabetes. 180 tablet 0    methocarbamol (ROBAXIN) 500 MG tablet Take one tablet by mouth 4  times a day as needed for muscle 40 tablet 2    montelukast (SINGULAIR) 10 MG tablet Take 1 tablet by mouth daily for allergy control 30 tablet 5    Propylene Glycol (SYSTANE COMPLETE) 0.6 % SOLN Place 2 drops into both eyes daily.      sertraline (ZOLOFT) 50 MG tablet TAKE 1 TABLET BY MOUTH ONCE DAILY TO CONTROL ANXIETY 90 tablet 3    Dulaglutide (TRULICITY) 1.5 BS/4.9QP SOPN Inject 0.5 mLs (1.5 mg total) into the skin once a week for diabetes 6 mL 3    glipiZIDE (GLUCOTROL XL) 10 MG 24 hr tablet TAKE 1 TABLET BY MOUTH DAILY FOR DIABETES CONTROL. (Patient not taking: Reported on 10/07/2021) 90 tablet 3 Not Taking   glucose blood (ONETOUCH ULTRA) test strip TEST BLOOD SUGAR ONCE A DAY      losartan (COZAAR) 50 MG tablet TAKE 1 TABLET BY MOUTH DAILY TO PRESERVE KIDNEY FUNCTION. (Patient not taking: Reported on 10/07/2021) 90 tablet 3 Not Taking   montelukast (SINGULAIR) 10 MG tablet Take 1 tablet by mouth daily for allergy control (Patient not taking: Reported on 10/07/2021) 90 tablet 3 Not Taking   prasugrel (EFFIENT) 10 MG TABS tablet Take 1 tablet (10 mg total) by mouth daily. 90 tablet 2    triamcinolone cream (KENALOG) 0.1 % Apply to rash up to 3 times a day as needed (Patient not taking: Reported on 10/07/2021) 80 g 2 Completed Course   Allergies  Allergen Reactions   Brilinta [Ticagrelor] Shortness Of Breath   Lisinopril Cough   Asa [Aspirin] Hives and Rash   Blue Dyes (Parenteral) Hives   Corn-Containing Products Hives   Nsaids Hives and Rash   Sugar-Protein-Starch Hives   Tea Hives   Rosuvastatin     myalgias   Zetia [Ezetimibe]     myalgias   Isosulfan Blue Rash   Other Itching and Rash    CHG soap    Social History   Tobacco Use   Smoking status: Former    Packs/day: 1.00    Years: 10.00    Pack years: 10.00    Types: Cigarettes    Quit date: 2013    Years since quitting: 10.1   Smokeless tobacco: Never   Tobacco comments:    restarted  about 5-6 yrs ago, then stopped   Substance Use Topics   Alcohol use: Not Currently    Comment: Rare    Family History  Problem Relation Age of Onset   Thyroid disease Mother  Heart attack Mother    Dementia Mother    Heart attack Father    Sudden death Father    Breast cancer Maternal Aunt    Hypertension Neg Hx    Hyperlipidemia Neg Hx    Diabetes Neg Hx      Review of Systems  All other systems reviewed and are negative.  Objective:  Physical Exam Vitals reviewed.  Constitutional:      Appearance: Normal appearance.  HENT:     Head: Normocephalic and atraumatic.  Eyes:     Extraocular Movements: Extraocular movements intact.     Pupils: Pupils are equal, round, and reactive to light.  Cardiovascular:     Rate and Rhythm: Normal rate and regular rhythm.  Pulmonary:     Effort: Pulmonary effort is normal.     Breath sounds: Normal breath sounds.  Musculoskeletal:     Cervical back: Normal range of motion and neck supple.     Right hip: Tenderness and bony tenderness present. Decreased range of motion. Decreased strength.  Neurological:     Mental Status: She is alert and oriented to person, place, and time.  Psychiatric:        Behavior: Behavior normal.    Vital signs in last 24 hours:    Labs:   Estimated body mass index is 35.67 kg/m as calculated from the following:   Height as of 10/08/21: 5\' 2"  (1.575 m).   Weight as of 10/08/21: 88.5 kg.   Imaging Review Plain radiographs demonstrate severe degenerative joint disease of the right hip(s). The bone quality appears to be good for age and reported activity level.      Assessment/Plan:  End stage arthritis, right hip(s)  The patient history, physical examination, clinical judgement of the provider and imaging studies are consistent with end stage degenerative joint disease of the right hip(s) and total hip arthroplasty is deemed medically necessary. The treatment options including medical management, injection therapy,  arthroscopy and arthroplasty were discussed at length. The risks and benefits of total hip arthroplasty were presented and reviewed. The risks due to aseptic loosening, infection, stiffness, dislocation/subluxation,  thromboembolic complications and other imponderables were discussed.  The patient acknowledged the explanation, agreed to proceed with the plan and consent was signed. Patient is being admitted for inpatient treatment for surgery, pain control, PT, OT, prophylactic antibiotics, VTE prophylaxis, progressive ambulation and ADL's and discharge planning.The patient is planning to be discharged home with home health services

## 2021-10-17 ENCOUNTER — Encounter (HOSPITAL_COMMUNITY): Payer: Self-pay | Admitting: Orthopaedic Surgery

## 2021-10-17 ENCOUNTER — Ambulatory Visit (HOSPITAL_COMMUNITY): Payer: PPO

## 2021-10-17 ENCOUNTER — Ambulatory Visit (HOSPITAL_BASED_OUTPATIENT_CLINIC_OR_DEPARTMENT_OTHER): Payer: PPO | Admitting: Anesthesiology

## 2021-10-17 ENCOUNTER — Encounter (HOSPITAL_COMMUNITY)
Admission: RE | Disposition: A | Discharge status: Home Health | Payer: Self-pay | Source: Home / Self Care | Attending: Orthopaedic Surgery

## 2021-10-17 ENCOUNTER — Other Ambulatory Visit: Payer: Self-pay

## 2021-10-17 ENCOUNTER — Ambulatory Visit (HOSPITAL_COMMUNITY): Payer: PPO | Admitting: Emergency Medicine

## 2021-10-17 ENCOUNTER — Observation Stay (HOSPITAL_COMMUNITY)
Admission: RE | Admit: 2021-10-17 | Discharge: 2021-10-18 | Disposition: A | Discharge status: Home Health | Payer: PPO | Attending: Orthopaedic Surgery | Admitting: Orthopaedic Surgery

## 2021-10-17 ENCOUNTER — Observation Stay (HOSPITAL_COMMUNITY): Payer: PPO

## 2021-10-17 DIAGNOSIS — M1611 Unilateral primary osteoarthritis, right hip: Secondary | ICD-10-CM | POA: Diagnosis not present

## 2021-10-17 DIAGNOSIS — I252 Old myocardial infarction: Secondary | ICD-10-CM

## 2021-10-17 DIAGNOSIS — Z96641 Presence of right artificial hip joint: Secondary | ICD-10-CM

## 2021-10-17 DIAGNOSIS — I2510 Atherosclerotic heart disease of native coronary artery without angina pectoris: Secondary | ICD-10-CM | POA: Insufficient documentation

## 2021-10-17 DIAGNOSIS — Z79899 Other long term (current) drug therapy: Secondary | ICD-10-CM | POA: Insufficient documentation

## 2021-10-17 DIAGNOSIS — Z85118 Personal history of other malignant neoplasm of bronchus and lung: Secondary | ICD-10-CM | POA: Insufficient documentation

## 2021-10-17 DIAGNOSIS — Z951 Presence of aortocoronary bypass graft: Secondary | ICD-10-CM | POA: Insufficient documentation

## 2021-10-17 DIAGNOSIS — Z7985 Long-term (current) use of injectable non-insulin antidiabetic drugs: Secondary | ICD-10-CM | POA: Insufficient documentation

## 2021-10-17 DIAGNOSIS — Z96642 Presence of left artificial hip joint: Secondary | ICD-10-CM | POA: Insufficient documentation

## 2021-10-17 DIAGNOSIS — E119 Type 2 diabetes mellitus without complications: Secondary | ICD-10-CM | POA: Diagnosis not present

## 2021-10-17 DIAGNOSIS — Z7984 Long term (current) use of oral hypoglycemic drugs: Secondary | ICD-10-CM | POA: Insufficient documentation

## 2021-10-17 DIAGNOSIS — Z419 Encounter for procedure for purposes other than remedying health state, unspecified: Secondary | ICD-10-CM

## 2021-10-17 DIAGNOSIS — Z01812 Encounter for preprocedural laboratory examination: Secondary | ICD-10-CM

## 2021-10-17 DIAGNOSIS — Z87891 Personal history of nicotine dependence: Secondary | ICD-10-CM | POA: Insufficient documentation

## 2021-10-17 DIAGNOSIS — Z20822 Contact with and (suspected) exposure to covid-19: Secondary | ICD-10-CM | POA: Insufficient documentation

## 2021-10-17 HISTORY — PX: TOTAL HIP ARTHROPLASTY: SHX124

## 2021-10-17 LAB — GLUCOSE, CAPILLARY
Glucose-Capillary: 121 mg/dL — ABNORMAL HIGH (ref 70–99)
Glucose-Capillary: 70 mg/dL (ref 70–99)

## 2021-10-17 LAB — TYPE AND SCREEN
ABO/RH(D): B POS
Antibody Screen: NEGATIVE

## 2021-10-17 SURGERY — ARTHROPLASTY, HIP, TOTAL, ANTERIOR APPROACH
Anesthesia: Spinal | Site: Hip | Laterality: Right

## 2021-10-17 MED ORDER — ONDANSETRON HCL 4 MG/2ML IJ SOLN
INTRAMUSCULAR | Status: DC | PRN
  Administered 2021-10-17: 4 mg via INTRAVENOUS

## 2021-10-17 MED ORDER — OXYCODONE HCL 5 MG PO TABS
5.0000 mg | ORAL_TABLET | ORAL | Status: DC | PRN
  Administered 2021-10-17 – 2021-10-18 (×3): 10 mg via ORAL
  Filled 2021-10-17 (×3): qty 2

## 2021-10-17 MED ORDER — POVIDONE-IODINE 10 % EX SWAB
2.0000 "application " | Freq: Once | CUTANEOUS | Status: AC
  Administered 2021-10-17: 2 via TOPICAL

## 2021-10-17 MED ORDER — METOCLOPRAMIDE HCL 5 MG/ML IJ SOLN: 5.0000 mg | Freq: Three times a day (TID) | INTRAMUSCULAR | Status: DC | PRN

## 2021-10-17 MED ORDER — DOCUSATE SODIUM 100 MG PO CAPS
100.0000 mg | ORAL_CAPSULE | Freq: Two times a day (BID) | ORAL | Status: DC
  Administered 2021-10-17 – 2021-10-18 (×2): 100 mg via ORAL
  Filled 2021-10-17 (×2): qty 1

## 2021-10-17 MED ORDER — PHENYLEPHRINE HCL (PRESSORS) 10 MG/ML IV SOLN
INTRAVENOUS | Status: DC | PRN
  Administered 2021-10-17 (×5): 80 ug via INTRAVENOUS

## 2021-10-17 MED ORDER — MIDAZOLAM HCL 5 MG/5ML IJ SOLN
INTRAMUSCULAR | Status: DC | PRN
  Administered 2021-10-17: 2 mg via INTRAVENOUS

## 2021-10-17 MED ORDER — TRANEXAMIC ACID-NACL 1000-0.7 MG/100ML-% IV SOLN
1000.0000 mg | INTRAVENOUS | Status: AC
  Administered 2021-10-17: 1000 mg via INTRAVENOUS
  Filled 2021-10-17: qty 100

## 2021-10-17 MED ORDER — FENTANYL CITRATE (PF) 100 MCG/2ML IJ SOLN
INTRAMUSCULAR | Status: AC
  Filled 2021-10-17: qty 2

## 2021-10-17 MED ORDER — METHOCARBAMOL 500 MG PO TABS
500.0000 mg | ORAL_TABLET | Freq: Four times a day (QID) | ORAL | Status: DC | PRN
Start: 2021-10-17 — End: 2021-10-18
  Administered 2021-10-17 – 2021-10-18 (×2): 500 mg via ORAL
  Filled 2021-10-17 (×3): qty 1

## 2021-10-17 MED ORDER — PRASUGREL HCL 10 MG PO TABS
10.0000 mg | ORAL_TABLET | Freq: Every day | ORAL | Status: DC
  Administered 2021-10-18: 10 mg via ORAL
  Filled 2021-10-17: qty 1

## 2021-10-17 MED ORDER — ORAL CARE MOUTH RINSE
15.0000 mL | Freq: Once | OROMUCOSAL | Status: AC
  Administered 2021-10-17: 15 mL via OROMUCOSAL

## 2021-10-17 MED ORDER — METHOCARBAMOL 500 MG IVPB - SIMPLE MED
500.0000 mg | Freq: Four times a day (QID) | INTRAVENOUS | Status: DC | PRN
  Filled 2021-10-17: qty 50

## 2021-10-17 MED ORDER — GLIPIZIDE ER 5 MG PO TB24
10.0000 mg | ORAL_TABLET | Freq: Every day | ORAL | Status: DC
  Administered 2021-10-18: 10 mg via ORAL
  Filled 2021-10-17: qty 2

## 2021-10-17 MED ORDER — OXYCODONE HCL 5 MG PO TABS
10.0000 mg | ORAL_TABLET | ORAL | Status: DC | PRN
  Administered 2021-10-18 (×2): 15 mg via ORAL
  Filled 2021-10-17 (×2): qty 3

## 2021-10-17 MED ORDER — ONDANSETRON HCL 4 MG PO TABS
4.0000 mg | ORAL_TABLET | Freq: Four times a day (QID) | ORAL | Status: DC | PRN
  Filled 2021-10-17: qty 1

## 2021-10-17 MED ORDER — POLYETHYLENE GLYCOL 3350 17 G PO PACK: 17.0000 g | PACK | Freq: Every day | ORAL | Status: DC | PRN

## 2021-10-17 MED ORDER — MONTELUKAST SODIUM 10 MG PO TABS
10.0000 mg | ORAL_TABLET | Freq: Every day | ORAL | Status: DC
Start: 2021-10-18 — End: 2021-10-18

## 2021-10-17 MED ORDER — ASPIRIN 81 MG PO CHEW: 81.0000 mg | CHEWABLE_TABLET | Freq: Two times a day (BID) | ORAL | Status: DC

## 2021-10-17 MED ORDER — MENTHOL 3 MG MT LOZG: 1.0000 | LOZENGE | OROMUCOSAL | Status: DC | PRN

## 2021-10-17 MED ORDER — FENTANYL CITRATE (PF) 100 MCG/2ML IJ SOLN
INTRAMUSCULAR | Status: DC | PRN
  Administered 2021-10-17: 100 ug via INTRAVENOUS

## 2021-10-17 MED ORDER — PHENYLEPHRINE HCL (PRESSORS) 10 MG/ML IV SOLN
INTRAVENOUS | Status: AC
  Filled 2021-10-17: qty 1

## 2021-10-17 MED ORDER — PHENOL 1.4 % MT LIQD: 1.0000 | OROMUCOSAL | Status: DC | PRN

## 2021-10-17 MED ORDER — PANTOPRAZOLE SODIUM 40 MG PO TBEC
40.0000 mg | DELAYED_RELEASE_TABLET | Freq: Every day | ORAL | Status: DC
  Administered 2021-10-17 – 2021-10-18 (×2): 40 mg via ORAL
  Filled 2021-10-17 (×2): qty 1

## 2021-10-17 MED ORDER — PROMETHAZINE HCL 25 MG/ML IJ SOLN: 6.2500 mg | INTRAMUSCULAR | Status: DC | PRN

## 2021-10-17 MED ORDER — SERTRALINE HCL 50 MG PO TABS
50.0000 mg | ORAL_TABLET | Freq: Every day | ORAL | Status: DC
  Administered 2021-10-18: 50 mg via ORAL
  Filled 2021-10-17: qty 1

## 2021-10-17 MED ORDER — CEFAZOLIN SODIUM-DEXTROSE 2-4 GM/100ML-% IV SOLN
2.0000 g | INTRAVENOUS | Status: AC
  Administered 2021-10-17: 2 g via INTRAVENOUS
  Filled 2021-10-17: qty 100

## 2021-10-17 MED ORDER — POLYVINYL ALCOHOL 1.4 % OP SOLN
2.0000 [drp] | Freq: Every day | OPHTHALMIC | Status: DC
  Administered 2021-10-18: 2 [drp] via OPHTHALMIC
  Filled 2021-10-17: qty 15

## 2021-10-17 MED ORDER — OXYCODONE HCL 5 MG PO TABS: 5.0000 mg | ORAL_TABLET | Freq: Once | ORAL | Status: DC | PRN

## 2021-10-17 MED ORDER — OXYCODONE HCL 5 MG/5ML PO SOLN: 5.0000 mg | Freq: Once | ORAL | Status: DC | PRN

## 2021-10-17 MED ORDER — ONDANSETRON HCL 4 MG/2ML IJ SOLN: 4.0000 mg | Freq: Four times a day (QID) | INTRAMUSCULAR | Status: DC | PRN

## 2021-10-17 MED ORDER — ALUM & MAG HYDROXIDE-SIMETH 200-200-20 MG/5ML PO SUSP: 30.0000 mL | ORAL | Status: DC | PRN

## 2021-10-17 MED ORDER — 0.9 % SODIUM CHLORIDE (POUR BTL) OPTIME
TOPICAL | Status: DC | PRN
  Administered 2021-10-17: 1000 mL

## 2021-10-17 MED ORDER — CEFAZOLIN SODIUM-DEXTROSE 1-4 GM/50ML-% IV SOLN
1.0000 g | Freq: Four times a day (QID) | INTRAVENOUS | Status: AC
  Administered 2021-10-17 (×2): 1 g via INTRAVENOUS
  Filled 2021-10-17 (×2): qty 50

## 2021-10-17 MED ORDER — PROPOFOL 500 MG/50ML IV EMUL
INTRAVENOUS | Status: DC | PRN
  Administered 2021-10-17: 75 ug/kg/min via INTRAVENOUS

## 2021-10-17 MED ORDER — CHLORHEXIDINE GLUCONATE 0.12 % MT SOLN: 15.0000 mL | Freq: Once | OROMUCOSAL | Status: AC

## 2021-10-17 MED ORDER — DEXAMETHASONE SODIUM PHOSPHATE 10 MG/ML IJ SOLN
INTRAMUSCULAR | Status: DC | PRN
  Administered 2021-10-17: 10 mg via INTRAVENOUS

## 2021-10-17 MED ORDER — PHENYLEPHRINE HCL-NACL 20-0.9 MG/250ML-% IV SOLN
INTRAVENOUS | Status: DC | PRN
  Administered 2021-10-17: 40 ug/min via INTRAVENOUS

## 2021-10-17 MED ORDER — MIDAZOLAM HCL 2 MG/2ML IJ SOLN
INTRAMUSCULAR | Status: AC
  Filled 2021-10-17: qty 2

## 2021-10-17 MED ORDER — LOSARTAN POTASSIUM 50 MG PO TABS
50.0000 mg | ORAL_TABLET | Freq: Every day | ORAL | Status: DC
  Administered 2021-10-18: 50 mg via ORAL
  Filled 2021-10-17: qty 1

## 2021-10-17 MED ORDER — SODIUM CHLORIDE 0.9 % IR SOLN
Status: DC | PRN
  Administered 2021-10-17: 1000 mL

## 2021-10-17 MED ORDER — HYDROMORPHONE HCL 1 MG/ML IJ SOLN: 0.2500 mg | INTRAMUSCULAR | Status: DC | PRN

## 2021-10-17 MED ORDER — METFORMIN HCL 850 MG PO TABS
850.0000 mg | ORAL_TABLET | Freq: Two times a day (BID) | ORAL | Status: DC
Start: 2021-10-18 — End: 2021-10-18
  Administered 2021-10-18: 850 mg via ORAL
  Filled 2021-10-17 (×2): qty 1

## 2021-10-17 MED ORDER — APIXABAN 2.5 MG PO TABS
2.5000 mg | ORAL_TABLET | Freq: Two times a day (BID) | ORAL | Status: DC
  Administered 2021-10-18: 2.5 mg via ORAL
  Filled 2021-10-17: qty 1

## 2021-10-17 MED ORDER — ACETAMINOPHEN 325 MG PO TABS
325.0000 mg | ORAL_TABLET | Freq: Four times a day (QID) | ORAL | Status: DC | PRN
  Administered 2021-10-18 (×2): 650 mg via ORAL
  Filled 2021-10-17 (×2): qty 2

## 2021-10-17 MED ORDER — STERILE WATER FOR IRRIGATION IR SOLN
Status: DC | PRN
  Administered 2021-10-17: 2000 mL

## 2021-10-17 MED ORDER — CARVEDILOL 6.25 MG PO TABS
6.2500 mg | ORAL_TABLET | Freq: Two times a day (BID) | ORAL | Status: DC
  Administered 2021-10-17 – 2021-10-18 (×2): 6.25 mg via ORAL
  Filled 2021-10-17 (×2): qty 1

## 2021-10-17 MED ORDER — SODIUM CHLORIDE 0.9 % IV SOLN: INTRAVENOUS | Status: DC

## 2021-10-17 MED ORDER — LACTATED RINGERS IV SOLN: INTRAVENOUS | Status: DC

## 2021-10-17 MED ORDER — HYDROMORPHONE HCL 1 MG/ML IJ SOLN
0.5000 mg | INTRAMUSCULAR | Status: DC | PRN
  Administered 2021-10-17 – 2021-10-18 (×3): 1 mg via INTRAVENOUS
  Filled 2021-10-17 (×3): qty 1

## 2021-10-17 MED ORDER — METOCLOPRAMIDE HCL 5 MG PO TABS
5.0000 mg | ORAL_TABLET | Freq: Three times a day (TID) | ORAL | Status: DC | PRN
  Filled 2021-10-17: qty 2

## 2021-10-17 SURGICAL SUPPLY — 46 items
APL SKNCLS STERI-STRIP NONHPOA (GAUZE/BANDAGES/DRESSINGS)
BAG COUNTER SPONGE SURGICOUNT (BAG) ×2 IMPLANT
BAG SPEC THK2 15X12 ZIP CLS (MISCELLANEOUS)
BAG SPNG CNTER NS LX DISP (BAG) ×1
BAG ZIPLOCK 12X15 (MISCELLANEOUS) IMPLANT
BENZOIN TINCTURE PRP APPL 2/3 (GAUZE/BANDAGES/DRESSINGS) IMPLANT
BLADE SAW SGTL 18X1.27X75 (BLADE) ×2 IMPLANT
COVER PERINEAL POST (MISCELLANEOUS) ×2 IMPLANT
COVER SURGICAL LIGHT HANDLE (MISCELLANEOUS) ×2 IMPLANT
DRAPE FOOT SWITCH (DRAPES) ×2 IMPLANT
DRAPE STERI IOBAN 125X83 (DRAPES) ×2 IMPLANT
DRAPE U-SHAPE 47X51 STRL (DRAPES) ×4 IMPLANT
DRESSING AQUACEL AG SP 3.5X10 (GAUZE/BANDAGES/DRESSINGS) IMPLANT
DRSG AQUACEL AG ADV 3.5X10 (GAUZE/BANDAGES/DRESSINGS) ×2 IMPLANT
DRSG AQUACEL AG SP 3.5X10 (GAUZE/BANDAGES/DRESSINGS) ×2
DURAPREP 26ML APPLICATOR (WOUND CARE) ×2 IMPLANT
ELECT REM PT RETURN 15FT ADLT (MISCELLANEOUS) ×2 IMPLANT
GAUZE XEROFORM 1X8 LF (GAUZE/BANDAGES/DRESSINGS) ×2 IMPLANT
GLOVE SRG 8 PF TXTR STRL LF DI (GLOVE) ×2 IMPLANT
GLOVE SURG ENC MOIS LTX SZ7.5 (GLOVE) ×2 IMPLANT
GLOVE SURG NEOPR MICRO LF SZ8 (GLOVE) ×2 IMPLANT
GLOVE SURG UNDER POLY LF SZ8 (GLOVE) ×4
GOWN STRL REUS W/TWL XL LVL3 (GOWN DISPOSABLE) ×4 IMPLANT
HANDPIECE INTERPULSE COAX TIP (DISPOSABLE) ×2
HEAD M SROM 36MM 2 (Hips) IMPLANT
HOLDER FOLEY CATH W/STRAP (MISCELLANEOUS) ×2 IMPLANT
KIT TURNOVER KIT A (KITS) IMPLANT
LINER ACETAB NEUTRAL 36ID 520D (Liner) ×1 IMPLANT
PACK ANTERIOR HIP CUSTOM (KITS) ×2 IMPLANT
PIN SECTOR W/GRIP ACE CUP 52MM (Hips) ×1 IMPLANT
SET HNDPC FAN SPRY TIP SCT (DISPOSABLE) ×1 IMPLANT
SPONGE T-LAP 18X18 ~~LOC~~+RFID (SPONGE) ×6 IMPLANT
SROM M HEAD 36MM 2 (Hips) ×2 IMPLANT
STAPLER VISISTAT 35W (STAPLE) IMPLANT
STEM CORAIL KLA09 (Stem) ×1 IMPLANT
STRIP CLOSURE SKIN 1/2X4 (GAUZE/BANDAGES/DRESSINGS) IMPLANT
SUT ETHIBOND NAB CT1 #1 30IN (SUTURE) ×2 IMPLANT
SUT ETHILON 2 0 PS N (SUTURE) IMPLANT
SUT MNCRL AB 4-0 PS2 18 (SUTURE) IMPLANT
SUT VIC AB 0 CT1 36 (SUTURE) ×2 IMPLANT
SUT VIC AB 1 CT1 36 (SUTURE) ×2 IMPLANT
SUT VIC AB 2-0 CT1 27 (SUTURE) ×4
SUT VIC AB 2-0 CT1 TAPERPNT 27 (SUTURE) ×2 IMPLANT
TRAY FOLEY MTR SLVR 14FR STAT (SET/KITS/TRAYS/PACK) ×1 IMPLANT
TRAY FOLEY MTR SLVR 16FR STAT (SET/KITS/TRAYS/PACK) IMPLANT
YANKAUER SUCT BULB TIP NO VENT (SUCTIONS) ×2 IMPLANT

## 2021-10-17 NOTE — Discharge Instructions (Addendum)
Information on my medicine - ELIQUIS (apixaban) Why was Eliquis prescribed for you? Eliquis was prescribed for you to reduce the risk of blood clots forming after orthopedic surgery.    What do You need to know about Eliquis? Take your Eliquis TWICE DAILY - one tablet in the morning and one tablet in the evening with or without food.  It would be best to take the dose about the same time each day.  If you have difficulty swallowing the tablet whole please discuss with your pharmacist how to take the medication safely.  Take Eliquis exactly as prescribed by your doctor and DO NOT stop taking Eliquis without talking to the doctor who prescribed the medication.  Stopping without other medication to take the place of Eliquis may increase your risk of developing a clot.  After discharge, you should have regular check-up appointments with your healthcare provider that is prescribing your Eliquis.  What do you do if you miss a dose? If a dose of ELIQUIS is not taken at the scheduled time, take it as soon as possible on the same day and twice-daily administration should be resumed.  The dose should not be doubled to make up for a missed dose.  Do not take more than one tablet of ELIQUIS at the same time.  Important Safety Information A possible side effect of Eliquis is bleeding. You should call your healthcare provider right away if you experience any of the following: Bleeding from an injury or your nose that does not stop. Unusual colored urine (red or dark brown) or unusual colored stools (red or black). Unusual bruising for unknown reasons. A serious fall or if you hit your head (even if there is no bleeding).  Some medicines may interact with Eliquis and might increase your risk of bleeding or clotting while on Eliquis. To help avoid this, consult your healthcare provider or pharmacist prior to using any new prescription or non-prescription medications, including herbals, vitamins,  non-steroidal anti-inflammatory drugs (NSAIDs) and supplements.  This website has more information on Eliquis (apixaban): http://www.eliquis.com/eliquis/home INSTRUCTIONS AFTER JOINT REPLACEMENT   Remove items at home which could result in a fall. This includes throw rugs or furniture in walking pathways ICE to the affected joint every three hours while awake for 30 minutes at a time, for at least the first 3-5 days, and then as needed for pain and swelling.  Continue to use ice for pain and swelling. You may notice swelling that will progress down to the foot and ankle.  This is normal after surgery.  Elevate your leg when you are not up walking on it.   Continue to use the breathing machine you got in the hospital (incentive spirometer) which will help keep your temperature down.  It is common for your temperature to cycle up and down following surgery, especially at night when you are not up moving around and exerting yourself.  The breathing machine keeps your lungs expanded and your temperature down.   DIET:  As you were doing prior to hospitalization, we recommend a well-balanced diet.  DRESSING / WOUND CARE / SHOWERING  Keep the surgical dressing until follow up.  The dressing is water proof, so you can shower without any extra covering.  IF THE DRESSING FALLS OFF or the wound gets wet inside, change the dressing with sterile gauze.  Please use good hand washing techniques before changing the dressing.  Do not use any lotions or creams on the incision until instructed by your surgeon.  ACTIVITY  Increase activity slowly as tolerated, but follow the weight bearing instructions below.   No driving for 6 weeks or until further direction given by your physician.  You cannot drive while taking narcotics.  No lifting or carrying greater than 10 lbs. until further directed by your surgeon. Avoid periods of inactivity such as sitting longer than an hour when not asleep. This helps prevent  blood clots.  You may return to work once you are authorized by your doctor.     WEIGHT BEARING   Weight bearing as tolerated with assist device (walker, cane, etc) as directed, use it as long as suggested by your surgeon or therapist, typically at least 4-6 weeks.   EXERCISES  Results after joint replacement surgery are often greatly improved when you follow the exercise, range of motion and muscle strengthening exercises prescribed by your doctor. Safety measures are also important to protect the joint from further injury. Any time any of these exercises cause you to have increased pain or swelling, decrease what you are doing until you are comfortable again and then slowly increase them. If you have problems or questions, call your caregiver or physical therapist for advice.   Rehabilitation is important following a joint replacement. After just a few days of immobilization, the muscles of the leg can become weakened and shrink (atrophy).  These exercises are designed to build up the tone and strength of the thigh and leg muscles and to improve motion. Often times heat used for twenty to thirty minutes before working out will loosen up your tissues and help with improving the range of motion but do not use heat for the first two weeks following surgery (sometimes heat can increase post-operative swelling).   These exercises can be done on a training (exercise) mat, on the floor, on a table or on a bed. Use whatever works the best and is most comfortable for you.    Use music or television while you are exercising so that the exercises are a pleasant break in your day. This will make your life better with the exercises acting as a break in your routine that you can look forward to.   Perform all exercises about fifteen times, three times per day or as directed.  You should exercise both the operative leg and the other leg as well.  Exercises include:   Quad Sets - Tighten up the muscle on the  front of the thigh (Quad) and hold for 5-10 seconds.   Straight Leg Raises - With your knee straight (if you were given a brace, keep it on), lift the leg to 60 degrees, hold for 3 seconds, and slowly lower the leg.  Perform this exercise against resistance later as your leg gets stronger.  Leg Slides: Lying on your back, slowly slide your foot toward your buttocks, bending your knee up off the floor (only go as far as is comfortable). Then slowly slide your foot back down until your leg is flat on the floor again.  Angel Wings: Lying on your back spread your legs to the side as far apart as you can without causing discomfort.  Hamstring Strength:  Lying on your back, push your heel against the floor with your leg straight by tightening up the muscles of your buttocks.  Repeat, but this time bend your knee to a comfortable angle, and push your heel against the floor.  You may put a pillow under the heel to make it more comfortable if necessary.  A rehabilitation program following joint replacement surgery can speed recovery and prevent re-injury in the future due to weakened muscles. Contact your doctor or a physical therapist for more information on knee rehabilitation.    CONSTIPATION  Constipation is defined medically as fewer than three stools per week and severe constipation as less than one stool per week.  Even if you have a regular bowel pattern at home, your normal regimen is likely to be disrupted due to multiple reasons following surgery.  Combination of anesthesia, postoperative narcotics, change in appetite and fluid intake all can affect your bowels.   YOU MUST use at least one of the following options; they are listed in order of increasing strength to get the job done.  They are all available over the counter, and you may need to use some, POSSIBLY even all of these options:    Drink plenty of fluids (prune juice may be helpful) and high fiber foods Colace 100 mg by mouth twice a day   Senokot for constipation as directed and as needed Dulcolax (bisacodyl), take with full glass of water  Miralax (polyethylene glycol) once or twice a day as needed.  If you have tried all these things and are unable to have a bowel movement in the first 3-4 days after surgery call either your surgeon or your primary doctor.    If you experience loose stools or diarrhea, hold the medications until you stool forms back up.  If your symptoms do not get better within 1 week or if they get worse, check with your doctor.  If you experience "the worst abdominal pain ever" or develop nausea or vomiting, please contact the office immediately for further recommendations for treatment.   ITCHING:  If you experience itching with your medications, try taking only a single pain pill, or even half a pain pill at a time.  You can also use Benadryl over the counter for itching or also to help with sleep.   TED HOSE STOCKINGS:  Use stockings on both legs until for at least 2 weeks or as directed by physician office. They may be removed at night for sleeping.  MEDICATIONS:  See your medication summary on the After Visit Summary that nursing will review with you.  You may have some home medications which will be placed on hold until you complete the course of blood thinner medication.  It is important for you to complete the blood thinner medication as prescribed.  PRECAUTIONS:  If you experience chest pain or shortness of breath - call 911 immediately for transfer to the hospital emergency department.   If you develop a fever greater that 101 F, purulent drainage from wound, increased redness or drainage from wound, foul odor from the wound/dressing, or calf pain - CONTACT YOUR SURGEON.                                                   FOLLOW-UP APPOINTMENTS:  If you do not already have a post-op appointment, please call the office for an appointment to be seen by your surgeon.  Guidelines for how soon to be seen  are listed in your After Visit Summary, but are typically between 1-4 weeks after surgery.  OTHER INSTRUCTIONS:   Knee Replacement:  Do not place pillow under knee, focus on keeping the knee straight while resting.  CPM instructions: 0-90 degrees, 2 hours in the morning, 2 hours in the afternoon, and 2 hours in the evening. Place foam block, curve side up under heel at all times except when in CPM or when walking.  DO NOT modify, tear, cut, or change the foam block in any way.  POST-OPERATIVE OPIOID TAPER INSTRUCTIONS: It is important to wean off of your opioid medication as soon as possible. If you do not need pain medication after your surgery it is ok to stop day one. Opioids include: Codeine, Hydrocodone(Norco, Vicodin), Oxycodone(Percocet, oxycontin) and hydromorphone amongst others.  Long term and even short term use of opiods can cause: Increased pain response Dependence Constipation Depression Respiratory depression And more.  Withdrawal symptoms can include Flu like symptoms Nausea, vomiting And more Techniques to manage these symptoms Hydrate well Eat regular healthy meals Stay active Use relaxation techniques(deep breathing, meditating, yoga) Do Not substitute Alcohol to help with tapering If you have been on opioids for less than two weeks and do not have pain than it is ok to stop all together.  Plan to wean off of opioids This plan should start within one week post op of your joint replacement. Maintain the same interval or time between taking each dose and first decrease the dose.  Cut the total daily intake of opioids by one tablet each day Next start to increase the time between doses. The last dose that should be eliminated is the evening dose.   MAKE SURE YOU:  Understand these instructions.  Get help right away if you are not doing well or get worse.    Thank you for letting us be a part of your medical care team.  It is a privilege we respect greatly.  We  hope these instructions will help you stay on track for a fast and full recovery!

## 2021-10-17 NOTE — Transfer of Care (Signed)
Immediate Anesthesia Transfer of Care Note  Patient: Sarah Stevenson  Procedure(s) Performed: TOTAL HIP ARTHROPLASTY ANTERIOR APPROACH (Right: Hip)  Patient Location: PACU  Anesthesia Type:Spinal  Level of Consciousness: awake, alert  and oriented  Airway & Oxygen Therapy: Patient Spontanous Breathing and Patient connected to face mask oxygen  Post-op Assessment: Report given to RN and Post -op Vital signs reviewed and stable  Post vital signs: Reviewed and stable  Last Vitals:  Vitals Value Taken Time  BP 91/51 10/17/21 1218  Temp    Pulse 73 10/17/21 1221  Resp 15 10/17/21 1221  SpO2 100 % 10/17/21 1221  Vitals shown include unvalidated device data.  Last Pain:  Vitals:   10/17/21 0912  TempSrc:   PainSc: 0-No pain      Patients Stated Pain Goal: 3 (09/08/70 5366)  Complications: No notable events documented.

## 2021-10-17 NOTE — Anesthesia Procedure Notes (Signed)
Spinal  Start time: 10/17/2021 10:55 AM End time: 10/17/2021 10:59 AM Reason for block: surgical anesthesia Staffing Performed: resident/CRNA  Resident/CRNA: Gean Maidens, CRNA Preanesthetic Checklist Completed: patient identified, IV checked, site marked, risks and benefits discussed, surgical consent, monitors and equipment checked, pre-op evaluation and timeout performed Spinal Block Patient position: sitting Prep: DuraPrep Patient monitoring: heart rate, continuous pulse ox and blood pressure Approach: midline Location: L4-5 Injection technique: single-shot Needle Needle type: Pencan  Additional Notes PT sitting position, sterile prep and drape, negative paresthesia/heme

## 2021-10-17 NOTE — Anesthesia Postprocedure Evaluation (Signed)
Anesthesia Post Note  Patient: Sarah Stevenson  Procedure(s) Performed: TOTAL HIP ARTHROPLASTY ANTERIOR APPROACH (Right: Hip)     Patient location during evaluation: PACU Anesthesia Type: Spinal Level of consciousness: awake and alert Pain management: pain level controlled Vital Signs Assessment: post-procedure vital signs reviewed and stable Respiratory status: spontaneous breathing, nonlabored ventilation and respiratory function stable Cardiovascular status: blood pressure returned to baseline and stable Postop Assessment: no apparent nausea or vomiting Anesthetic complications: no   No notable events documented.  Last Vitals:  Vitals:   10/17/21 1245 10/17/21 1300  BP: (!) 97/57 101/67  Pulse: 68 68  Resp: 18 10  Temp:    SpO2: 93% 100%    Last Pain:  Vitals:   10/17/21 1300  TempSrc:   PainSc: 0-No pain                 Lynda Rainwater

## 2021-10-17 NOTE — Interval H&P Note (Signed)
History and Physical Interval Note: The patient understands that she is here today for right hip replacement to treat her right hip osteoarthritis.  There has been no acute or interval change in her medical status.  Please see recent H&P.  The risks and benefits of surgery have been explained in detail and informed consent is obtained.  The right operative hip has been marked.  10/17/2021 9:48 AM  [name]  has presented today for surgery, with the diagnosis of osteoarthritis right hip.  The various methods of treatment have been discussed with the patient and family. After consideration of risks, benefits and other options for treatment, the patient has consented to  Procedure(s): TOTAL HIP ARTHROPLASTY ANTERIOR APPROACH (Right) as a surgical intervention.  The patient's history has been reviewed, patient examined, no change in status, stable for surgery.  I have reviewed the patient's chart and labs.  Questions were answered to the patient's satisfaction.     Mcarthur Rossetti

## 2021-10-17 NOTE — Anesthesia Preprocedure Evaluation (Addendum)
Anesthesia Evaluation  Patient identified by MRN, date of birth, ID band Patient awake    Reviewed: Allergy & Precautions, NPO status , Patient's Chart, lab work & pertinent test results  Airway Mallampati: II  TM Distance: >3 FB Neck ROM: Full    Dental no notable dental hx.    Pulmonary neg pulmonary ROS, former smoker,    Pulmonary exam normal breath sounds clear to auscultation       Cardiovascular + CAD, + Past MI, + Cardiac Stents and + CABG  Normal cardiovascular exam Rhythm:Regular Rate:Normal     Neuro/Psych Depression negative neurological ROS  negative psych ROS   GI/Hepatic negative GI ROS, Neg liver ROS,   Endo/Other  negative endocrine ROSdiabetes, Type 2  Renal/GU negative Renal ROS  negative genitourinary   Musculoskeletal  (+) Arthritis , Osteoarthritis,    Abdominal (+) + obese,   Peds negative pediatric ROS (+)  Hematology negative hematology ROS (+)   Anesthesia Other Findings   Reproductive/Obstetrics negative OB ROS                            Anesthesia Physical Anesthesia Plan  ASA: 3  Anesthesia Plan: Spinal   Post-op Pain Management:    Induction: Intravenous  PONV Risk Score and Plan: 2 and Ondansetron, Midazolam and Treatment may vary due to age or medical condition  Airway Management Planned: Simple Face Mask  Additional Equipment:   Intra-op Plan:   Post-operative Plan:   Informed Consent: I have reviewed the patients History and Physical, chart, labs and discussed the procedure including the risks, benefits and alternatives for the proposed anesthesia with the patient or authorized representative who has indicated his/her understanding and acceptance.     Dental advisory given  Plan Discussed with: CRNA  Anesthesia Plan Comments:         Anesthesia Quick Evaluation

## 2021-10-17 NOTE — Brief Op Note (Signed)
10/17/2021  12:02 PM  PATIENT:  Sarah Stevenson  66 y.o. female  PRE-OPERATIVE DIAGNOSIS:  osteoarthritis right hip  POST-OPERATIVE DIAGNOSIS:  osteoarthritis right hip  PROCEDURE:  Procedure(s): TOTAL HIP ARTHROPLASTY ANTERIOR APPROACH (Right)  SURGEON:  Surgeon(s) and Role:    Mcarthur Rossetti, MD - Primary  PHYSICIAN ASSISTANT:  Benita Stabile, PA-C  ANESTHESIA:   spinal  EBL:  150 mL   COUNTS:  YES  PLAN OF CARE: Admit for overnight observation  PATIENT DISPOSITION:  PACU - hemodynamically stable.   Delay start of Pharmacological VTE agent (>24hrs) due to surgical blood loss or risk of bleeding: no

## 2021-10-17 NOTE — TOC Transition Note (Signed)
Transition of Care [facility]) - CM/SW Discharge Note   Patient Details  Name: Sarah Stevenson MRN: [id number] Date of Birth: Nov 19, 1955  [facility] CM/SW Contact:  Lennart Pall, LCSW Phone Number: 10/17/2021, 3:38 PM   Clinical Narrative:    Met with pt today and confirming she has all needed DME at home.  Pt aware MD has orders for HHPT - no agency preference - referral placed with [facility]  No further TOC needs.   Final next level of care: [place] Barriers to Discharge: No Barriers Identified   Patient Goals and CMS Choice Patient states their goals for this hospitalization and ongoing recovery are:: return home      Discharge Placement                       Discharge Plan and Services                DME Arranged: N/A DME Agency: NA       HH Arranged: PT HH Agency: [place] Date [place]: 10/17/21 Time [place]: [id number] Representative spoke with at [place]: [place] Determinants of Health ([place]) Interventions     Readmission Risk Interventions No flowsheet data found.

## 2021-10-17 NOTE — Evaluation (Signed)
Physical Therapy Evaluation Patient Details Name: Sarah Stevenson MRN: [id number] DOB: 1956-02-10 Today's Date: 10/17/2021  History of Present Illness  Pt s/p R THR and with hx of CAD, lung CA, I, DM, CABG, , aortic valve replacement and L THR (18)  Clinical Impression  Pt s/p R THR and presents with decreased R LE strength/ROM and post op pain limiting functional mobility.  Pt should progress to dc home with family assist.     Recommendations for follow up therapy are one component of a multi-disciplinary discharge planning process, led by the attending physician.  Recommendations may be updated based on patient status, additional functional criteria and insurance authorization.  Follow Up Recommendations Follow physician's recommendations for discharge plan and follow up therapies    Assistance Recommended at Discharge Intermittent Supervision/Assistance  Patient can return home with the following  A little help with walking and/or transfers;A little help with bathing/dressing/bathroom;Assistance with cooking/housework;Assist for transportation;Help with stairs or ramp for entrance    Equipment Recommendations None recommended by PT  Recommendations for Other Services       Functional Status Assessment Patient has had a recent decline in their functional status and demonstrates the ability to make significant improvements in function in a reasonable and predictable amount of time.     Precautions / Restrictions Precautions Precautions: Fall Restrictions Weight Bearing Restrictions: No Other Position/Activity Restrictions: WBAT      Mobility  Bed Mobility Overal bed mobility: Needs Assistance Bed Mobility: Supine to Sit     Supine to sit: Min assist     General bed mobility comments: cues for sequence and use of L LE to self assist    Transfers Overall transfer level: Needs assistance Equipment used: Rolling walker (2 wheels) Transfers: Sit to/from Stand Sit to  Stand: Min assist           General transfer comment: cues for LE management and use of UEs to self assist    Ambulation/Gait Ambulation/Gait assistance: Min assist Gait Distance (Feet): 60 Feet Assistive device: Rolling walker (2 wheels) Gait Pattern/deviations: Step-to pattern, Decreased step length - right, Decreased step length - left, Shuffle, Trunk flexed Gait velocity: decr     General Gait Details: cues for seqence, posture and position from [employer]            Wheelchair Mobility    Modified Rankin (Stroke Patients Only)       Balance Overall balance assessment: Needs assistance Sitting-balance support: No upper extremity supported, Feet supported Sitting balance-Leahy Scale: Good     Standing balance support: No upper extremity supported Standing balance-Leahy Scale: Fair                               Pertinent Vitals/Pain Pain Assessment Pain Assessment: 0-10 Pain Score: 4  Pain Location: R hip Pain Descriptors / Indicators: Aching, Sore Pain Intervention(s): Limited activity within patient's tolerance, Monitored during session, Premedicated before session, Ice applied    Home Living Family/patient expects to be discharged to:: Private residence Living Arrangements: Alone Available Help at Discharge: Family;Available 24 hours/day Type of Home: House Home Access: Stairs to enter Entrance Stairs-Rails: Right Entrance Stairs-Number of Steps: 1+2   Home Layout: One level Home Equipment: Conservation officer, nature (2 wheels);Rollator (4 wheels);Cane - single point Additional Comments: Pt states has 3n1 but lent it out and it trying to get it back    Prior Function Prior Level of Function : Independent/Modified  Independent                     Hand Dominance        Extremity/Trunk Assessment   Upper Extremity Assessment Upper Extremity Assessment: Overall WFL for tasks assessed    Lower Extremity Assessment Lower Extremity  Assessment: RLE deficits/detail    Cervical / Trunk Assessment Cervical / Trunk Assessment: Normal  Communication   Communication: No difficulties  Cognition Arousal/Alertness: Awake/alert Behavior During Therapy: WFL for tasks assessed/performed Overall Cognitive Status: Within Functional Limits for tasks assessed                                          General Comments      Exercises Total Joint Exercises Ankle Circles/Pumps: AROM, Both, 20 reps, Supine   Assessment/Plan    PT Assessment Patient needs continued PT services  PT Problem List Decreased strength;Decreased range of motion;Decreased activity tolerance;Decreased balance;Decreased mobility;Decreased knowledge of use of DME;Pain       PT Treatment Interventions DME instruction;Gait training;Stair training;Functional mobility training;Therapeutic activities;Therapeutic exercise;Patient/family education    PT Goals (Current goals can be found in the Care Plan section)  Acute Rehab PT Goals Patient Stated Goal: Regain INDq PT Goal Formulation: With patient Time For Goal Achievement: 10/24/21 Potential to Achieve Goals: Good    Frequency 7X/week     Co-evaluation               AM-PAC PT "6 Clicks" Mobility  Outcome Measure Help needed turning from your back to your side while in a flat bed without using bedrails?: A Little Help needed moving from lying on your back to sitting on the side of a flat bed without using bedrails?: A Little Help needed moving to and from a bed to a chair (including a wheelchair)?: A Little Help needed standing up from a chair using your arms (e.g., wheelchair or bedside chair)?: A Little Help needed to walk in hospital room?: A Little Help needed climbing 3-5 steps with a railing? : A Lot 6 Click Score: 17    End of Session Equipment Utilized During Treatment: Gait belt Activity Tolerance: Patient tolerated treatment well Patient left: in chair;with call  bell/phone within reach;with chair alarm set;with family/visitor present Nurse Communication: Mobility status PT Visit Diagnosis: Difficulty in walking, not elsewhere classified (R26.2)    Time: 0093-8182 PT Time Calculation (min) (ACUTE ONLY): 29 min   Charges:   PT Evaluation $PT Eval Low Complexity: 1 Low PT Treatments $Gait Training: 8-22 mins        Debe Coder PT Acute Rehabilitation Services Pager [phone] Office [phone]   Tracen Mahler 10/17/2021, 5:10 PM

## 2021-10-18 ENCOUNTER — Other Ambulatory Visit (HOSPITAL_COMMUNITY): Payer: Self-pay

## 2021-10-18 DIAGNOSIS — M1611 Unilateral primary osteoarthritis, right hip: Secondary | ICD-10-CM | POA: Diagnosis not present

## 2021-10-18 LAB — CBC
HCT: 35.4 % — ABNORMAL LOW (ref 36.0–46.0)
Hemoglobin: 11.4 g/dL — ABNORMAL LOW (ref 12.0–15.0)
MCH: 29.4 pg (ref 26.0–34.0)
MCHC: 32.2 g/dL (ref 30.0–36.0)
MCV: 91.2 fL (ref 80.0–100.0)
Platelets: 201 10*3/uL (ref 150–400)
RBC: 3.88 MIL/uL (ref 3.87–5.11)
RDW: 12.8 % (ref 11.5–15.5)
WBC: 13.5 10*3/uL — ABNORMAL HIGH (ref 4.0–10.5)
nRBC: 0 % (ref 0.0–0.2)

## 2021-10-18 LAB — BASIC METABOLIC PANEL
Anion gap: 6 (ref 5–15)
BUN: 23 mg/dL (ref 8–23)
CO2: 27 mmol/L (ref 22–32)
Calcium: 8.4 mg/dL — ABNORMAL LOW (ref 8.9–10.3)
Chloride: 105 mmol/L (ref 98–111)
Creatinine, Ser: 0.99 mg/dL (ref 0.44–1.00)
GFR, Estimated: >60 mL/min (ref >60)
Glucose, Bld: 197 mg/dL — ABNORMAL HIGH (ref 70–99)
Potassium: 4.4 mmol/L (ref 3.5–5.1)
Sodium: 138 mmol/L (ref 135–145)

## 2021-10-18 MED ORDER — TIZANIDINE HCL 4 MG PO TABS: 4.0000 mg | ORAL_TABLET | Freq: Three times a day (TID) | ORAL | 0 refills | Status: DC | PRN

## 2021-10-18 MED ORDER — OXYCODONE HCL 5 MG PO TABS: 5.0000 mg | ORAL_TABLET | Freq: Four times a day (QID) | ORAL | 0 refills | Status: DC | PRN

## 2021-10-18 MED ORDER — APIXABAN 2.5 MG PO TABS: 2.5000 mg | ORAL_TABLET | Freq: Two times a day (BID) | ORAL | 0 refills | Status: DC

## 2021-10-18 MED ORDER — APIXABAN 2.5 MG PO TABS
2.5000 mg | ORAL_TABLET | Freq: Two times a day (BID) | ORAL | 0 refills | Status: DC
  Filled 2021-10-18: qty 60, 30d supply, fill #0

## 2021-10-18 NOTE — Plan of Care (Signed)
Plan of care reviewed and discussed with the patient. 

## 2021-10-18 NOTE — Plan of Care (Signed)
Pt stable at this time. Pt do d/c home with family at bedside. No needs at this time. Pt dressing dry and intact. RN will continue to monitor.

## 2021-10-18 NOTE — Progress Notes (Signed)
Physical Therapy Treatment Patient Details Name: Sarah Stevenson MRN: [id number] DOB: [birth year] Today's Date: 10/18/2021   History of Present Illness Pt s/p R THR and with hx of CAD, lung CA, I, DM, CABG, , aortic valve replacement and L THR (18)    PT Comments    Pt continues motivated and progressing well with mobility.  Pt up to hall to ambulate limited distance, negotiated stairs, reviewed bed mobility, reviewed car transfers and reviewed written HEP.  Pt eager for dc home this date.   Recommendations for follow up therapy are one component of a multi-disciplinary discharge planning process, led by the attending physician.  Recommendations may be updated based on patient status, additional functional criteria and insurance authorization.  Follow Up Recommendations  Follow physician's recommendations for discharge plan and follow up therapies     Assistance Recommended at Discharge Intermittent Supervision/Assistance  Patient can return home with the following A little help with walking and/or transfers;A little help with bathing/dressing/bathroom;Assistance with cooking/housework;Assist for transportation;Help with stairs or ramp for entrance   Equipment Recommendations  None recommended by PT    Recommendations for Other Services       Precautions / Restrictions Precautions Precautions: Fall Restrictions Weight Bearing Restrictions: No Other Position/Activity Restrictions: WBAT     Mobility  Bed Mobility Overal bed mobility: Needs Assistance Bed Mobility: Sit to Supine     Supine to sit: Min guard Sit to supine: Min guard   General bed mobility comments: cues for sequence and use of L LE to self assist; pt self assisting R R LE with gait belt    Transfers Overall transfer level: Needs assistance Equipment used: Rolling walker (2 wheels) Transfers: Sit to/from Stand Sit to Stand: Min guard, Supervision           General transfer comment: cues for LE  management and use of UEs to self assist    Ambulation/Gait Ambulation/Gait assistance: Min guard, Supervision Gait Distance (Feet): 100 Feet Assistive device: Rolling walker (2 wheels) Gait Pattern/deviations: Decreased step length - right, Decreased step length - left, Shuffle, Trunk flexed, Step-to pattern, Step-through pattern Gait velocity: decr     General Gait Details: cues for seqence, posture and position from RW   Stairs Stairs: Yes Stairs assistance: Min assist Stair Management: No rails, Step to pattern, Forwards, Backwards, With walker Number of Stairs: 4 General stair comments: single step fwd and bkwd and 2 step with RW at top of step; cues for sequence and foot/RW placement   Wheelchair Mobility    Modified Rankin (Stroke Patients Only)       Balance Overall balance assessment: Needs assistance Sitting-balance support: No upper extremity supported, Feet supported Sitting balance-Leahy Scale: Good     Standing balance support: No upper extremity supported Standing balance-Leahy Scale: Fair                              Cognition Arousal/Alertness: Awake/alert Behavior During Therapy: WFL for tasks assessed/performed Overall Cognitive Status: Within Functional Limits for tasks assessed                                          Exercises Total Joint Exercises Ankle Circles/Pumps: AROM, Both, 20 reps, Supine Quad Sets: AROM, Both, 10 reps, Supine Heel Slides: AAROM, Right, 20 reps, Supine Hip ABduction/ADduction: AAROM, Right, 15 reps, Supine  General Comments        Pertinent Vitals/Pain Pain Assessment Pain Assessment: 0-10 Pain Score: 3  Pain Location: R hip Pain Descriptors / Indicators: Aching, Sore Pain Intervention(s): Limited activity within patient's tolerance, Monitored during session, Premedicated before session    Home Living                          Prior Function            PT  Goals (current goals can now be found in the care plan section) Acute Rehab PT Goals Patient Stated Goal: Regain IND PT Goal Formulation: With patient Time For Goal Achievement: 10/24/21 Potential to Achieve Goals: Good Progress towards PT goals: Progressing toward goals    Frequency    7X/week      PT Plan Current plan remains appropriate    Co-evaluation              AM-PAC PT "6 Clicks" Mobility   Outcome Measure  Help needed turning from your back to your side while in a flat bed without using bedrails?: A Little Help needed moving from lying on your back to sitting on the side of a flat bed without using bedrails?: A Little Help needed moving to and from a bed to a chair (including a wheelchair)?: A Little Help needed standing up from a chair using your arms (e.g., wheelchair or bedside chair)?: A Little Help needed to walk in hospital room?: A Little Help needed climbing 3-5 steps with a railing? : A Little 6 Click Score: 18    End of Session Equipment Utilized During Treatment: Gait belt Activity Tolerance: Patient tolerated treatment well Patient left: in bed;with call bell/phone within reach Nurse Communication: Mobility status PT Visit Diagnosis: Difficulty in walking, not elsewhere classified (R26.2)     Time: 1000-1040 PT Time Calculation (min) (ACUTE ONLY): 40 min  Charges:  $Gait Training: 8-22 mins $Therapeutic Exercise: 8-22 mins $Therapeutic Activity: 8-22 mins                     Sarah Stevenson PT Acute Rehabilitation Services Pager [phone] Office [phone]    Tylie Golonka 10/18/2021, 11:32 AM

## 2021-10-18 NOTE — Progress Notes (Signed)
Physical Therapy Treatment Patient Details Name: Sarah Stevenson MRN: [id number] DOB: November 17, 1955 Today's Date: 10/18/2021   History of Present Illness Pt s/p R THR and with hx of CAD, lung CA, I, DM, CABG, , aortic valve replacement and L THR (18)    PT Comments    Pt in good spirits and progressing well with mobility.  Pt hopeful for dc home later this date.   Recommendations for follow up therapy are one component of a multi-disciplinary discharge planning process, led by the attending physician.  Recommendations may be updated based on patient status, additional functional criteria and insurance authorization.  Follow Up Recommendations  Follow physician's recommendations for discharge plan and follow up therapies     Assistance Recommended at Discharge Intermittent Supervision/Assistance  Patient can return home with the following A little help with walking and/or transfers;A little help with bathing/dressing/bathroom;Assistance with cooking/housework;Assist for transportation;Help with stairs or ramp for entrance   Equipment Recommendations  None recommended by PT    Recommendations for Other Services       Precautions / Restrictions Precautions Precautions: Fall Restrictions Weight Bearing Restrictions: No Other Position/Activity Restrictions: WBAT     Mobility  Bed Mobility Overal bed mobility: Needs Assistance Bed Mobility: Supine to Sit     Supine to sit: Min guard     General bed mobility comments: cues for sequence and use of L LE to self assist    Transfers Overall transfer level: Needs assistance Equipment used: Rolling walker (2 wheels) Transfers: Sit to/from Stand Sit to Stand: Min guard           General transfer comment: cues for LE management and use of UEs to self assist    Ambulation/Gait Ambulation/Gait assistance: Min guard, Supervision Gait Distance (Feet): 150 Feet Assistive device: Rolling walker (2 wheels) Gait  Pattern/deviations: Decreased step length - right, Decreased step length - left, Shuffle, Trunk flexed, Step-to pattern, Step-through pattern Gait velocity: decr     General Gait Details: cues for seqence, posture and position from [employer]             Wheelchair Mobility    Modified Rankin (Stroke Patients Only)       Balance Overall balance assessment: Needs assistance Sitting-balance support: No upper extremity supported, Feet supported Sitting balance-Leahy Scale: Good     Standing balance support: No upper extremity supported Standing balance-Leahy Scale: Fair                              Cognition Arousal/Alertness: Awake/alert Behavior During Therapy: WFL for tasks assessed/performed Overall Cognitive Status: Within Functional Limits for tasks assessed                                          Exercises Total Joint Exercises Ankle Circles/Pumps: AROM, Both, 20 reps, Supine Quad Sets: AROM, Both, 10 reps, Supine Heel Slides: AAROM, Right, 20 reps, Supine Hip ABduction/ADduction: AAROM, Right, 15 reps, Supine    General Comments        Pertinent Vitals/Pain Pain Assessment Pain Assessment: 0-10 Pain Score: 3  Pain Location: R hip Pain Descriptors / Indicators: Aching, Sore Pain Intervention(s): Limited activity within patient's tolerance, Monitored during session, Premedicated before session    Home Living  Prior Function            PT Goals (current goals can now be found in the care plan section) Acute Rehab PT Goals Patient Stated Goal: Regain IND PT Goal Formulation: With patient Time For Goal Achievement: 10/24/21 Potential to Achieve Goals: Good Progress towards PT goals: Progressing toward goals    Frequency    7X/week      PT Plan Current plan remains appropriate    Co-evaluation              AM-PAC PT "6 Clicks" Mobility   Outcome Measure  Help  needed turning from your back to your side while in a flat bed without using bedrails?: A Little Help needed moving from lying on your back to sitting on the side of a flat bed without using bedrails?: A Little Help needed moving to and from a bed to a chair (including a wheelchair)?: A Little Help needed standing up from a chair using your arms (e.g., wheelchair or bedside chair)?: A Little Help needed to walk in hospital room?: A Little Help needed climbing 3-5 steps with a railing? : A Lot 6 Click Score: 17    End of Session Equipment Utilized During Treatment: Gait belt Activity Tolerance: Patient tolerated treatment well Patient left: in chair;with call bell/phone within reach;with chair alarm set;with nursing/sitter in room Nurse Communication: Mobility status PT Visit Diagnosis: Difficulty in walking, not elsewhere classified (R26.2)     Time: 7846-9629 PT Time Calculation (min) (ACUTE ONLY): 34 min  Charges:  $Gait Training: 8-22 mins $Therapeutic Exercise: 8-22 mins                     [place] Pager [phone] Office [phone]    Milton Streicher 10/18/2021, 8:55 AM

## 2021-10-18 NOTE — Progress Notes (Signed)
Subjective: 1 Day Post-Op Procedure(s) (LRB): TOTAL HIP ARTHROPLASTY ANTERIOR APPROACH (Right) Patient reports pain as moderate.    Objective: Vital signs in last 24 hours: Temp:  [97.7 F (36.5 C)-98.2 F (36.8 C)] 98 F (36.7 C) (02/11 0516) Pulse Rate:  [66-77] 71 (02/11 0516) Resp:  [10-18] 17 (02/11 0516) BP: (91-133)/(51-85) 104/55 (02/11 0516) SpO2:  [91 %-100 %] 95 % (02/11 0516) Weight:  [88.5 kg] 88.5 kg (02/10 0912)  Intake/Output from previous day: 02/10 0701 - 02/11 0700 In: 2301.3 [P.O.:600; I.V.:1701.3] Out: 3335 [Urine:3025; Blood:150] Intake/Output this shift: Total I/O In: 297.3 [P.O.:240; I.V.:57.3] Out: 0   Recent Labs    10/18/21 0312  HGB 11.4*   Recent Labs    10/18/21 0312  WBC 13.5*  RBC 3.88  HCT 35.4*  PLT 201   Recent Labs    10/18/21 0312  NA 138  K 4.4  CL 105  CO2 27  BUN 23  CREATININE 0.99  GLUCOSE 197*  CALCIUM 8.4*   No results for input(s): LABPT, INR in the last 72 hours.  Sensation intact distally Intact pulses distally Dorsiflexion/Plantar flexion intact Incision: dressing C/D/I   Assessment/Plan: 1 Day Post-Op Procedure(s) (LRB): TOTAL HIP ARTHROPLASTY ANTERIOR APPROACH (Right) Up with therapy Discharge home with home health      Mcarthur Rossetti 10/18/2021, 8:06 AM

## 2021-10-18 NOTE — Op Note (Signed)
[name], LABO MEDICAL RECORD NO: [id number] ACCOUNT NO: [id number] DATE OF BIRTH: 01-Mar-1956 FACILITY: [facility] LOC[facility]: [id number] PHYSICIAN: Lind Guest. [name], MD  Operative Report   DATE OF PROCEDURE: 10/17/2021  PREOPERATIVE DIAGNOSIS:  Primary arthritis and degenerative joint disease, right hip.  POSTOPERATIVE DIAGNOSIS:  Primary arthritis and degenerative joint disease, right hip.  PROCEDURE:  Right total hip arthroplasty through direct anterior approach.  IMPLANTS:  DePuy sector Gription acetabular component size 52, size 36+0 neutral polyethylene liner, size 9 Corail femoral component with varus offset (KLA), size 36, -2 metal hip ball.  SURGEON:  Lind Guest. [name], MD  ASSISTANT:  Benita Stabile, PA-C.  ANESTHESIA:  Spinal.  ANTIBIOTICS:  2 g IV Ancef.  ESTIMATED BLOOD LOSS:  150 mL.  COMPLICATIONS:  None.  INDICATIONS:  The patient is a 66 year old female with debilitating arthritis involving her right hip.  I actually replaced her left hip several years ago for arthritis.  That hip is doing well.  At this point, her hip arthritis on the right side is  worse to the point where it is detrimentally affecting her mobility, her quality of life and activities of daily living.  She does wish to proceed with a total hip arthroplasty on the right side at this point and we agree with this.  We did talk about  the risk of acute blood loss anemia, nerve or vessel injury, fracture, infection, dislocation, DVT, implant failure, leg length discrepancies and skin and soft tissue issues.  We talked about our goals being decreased pain, improve mobility and overall  improve quality of life.  DESCRIPTION OF PROCEDURE:  After informed consent was obtained, and appropriate right hip was marked.  She was brought to the operating room and sat up on the stretcher where spinal anesthesia was obtained.  She was laid in supine position on the  stretcher.  A Foley catheter was placed and  traction boots were placed on both her feet.  Next, she was placed supine on the Hana fracture table.  The perineal post in place and both legs in line skeletal traction device and no traction applied.  Her  right operative hip was prepped and draped with DuraPrep and sterile drapes.  A timeout was called and she was identified correct patient, correct right hip.  I then made an incision just inferior and posterior to the anterior superior iliac spine and  carried this obliquely down the leg.  We dissected down tensor fascia lata muscle.  Tensor fascia was then divided longitudinally to proceed with direct anterior approach to the hip.  We identified and cauterized the circumflex vessels and identified the  hip capsule. We opened up the hip capsule in L-type format finding a moderate joint effusion.  We placed Cobra retractors around the medial and lateral femoral neck and made a femoral neck cut with oscillating saw just proximal to the lesser trochanter  and completed this with an osteotome, placed a corkscrew guide in the femoral head and removed the femoral heads entirety and found a wide area devoid of cartilage.  I then placed a bent Hohmann over the medial acetabular rim and removed remnants of  acetabular labrum and other debris.  We then began reaming under direct visualization from a size 43 reamer in a stepwise increments going up to a size 51 reamer based on the size of her head and how we were seeing this fill. All reamers again was placed  under direct visualization. The last reamer was placed under  direct fluoroscopy, so I could really obtain my depth of reaming by inclination and anteversion.  I then placed real DePuy sector Gription acetabular component size 52.  With a 36, +0  polyethylene liner for that size acetabular component.  Attention was then turned to the femur.  With the leg externally rotated to 120 degrees, extended, and, adducted, we were able to place a Mueller retractor  medially and Hohman retractor behind the  greater trochanter. We released lateral joint capsule and used a box cutting osteotome to enter femoral canal and a rongeur to lateralize, then began broaching using the Corail broaching system from a size 8 going on to a size 9.  It had a very tight  feel to it. We assessed it under fluoroscopy to see if there was a nice tight fit.  We tried a varus offset femoral neck and a 36, -2 hip ball and reduced this in the acetabulum.  We were pleased with stability, assessed clinically and radiographically  as well leg lengths and offset.  We then dislocated the hip and removed the trial components.  I placed the real Corail femoral component with varus offset size 9 and the real 36, -2 metal hip ball and again reduced this in acetabulum and it was stable  and we assessed it mechanically and radiographically.  We then irrigated the soft tissue with normal saline solution using pulsatile lavage.  We closed the joint capsule with interrupted #1 Ethibond suture followed by #1 Vicryl to close the tensor  fascia.  0 Vicryl was used to close deep tissue and 2-0 Vicryl was used to close the subcutaneous tissue.  The skin was closed with staples.  An Aquacel dressing was applied.  She was taken off the Hana table, awakened, extubated, and taken to recovery  room in stable condition with all final counts being correct.  No complications noted.  Of note, Benita Stabile, PA-C did assist in entire case and assistance was crucial for facilitating all aspects of this case.   MUK D: 10/17/2021 12:00:55 pm T: 10/18/2021 1:28:00 am  JOB: [id number]/ [id number]

## 2021-10-18 NOTE — Plan of Care (Signed)
°  Problem: Clinical Measurements: Goal: Postoperative complications will be avoided or minimized Outcome: Progressing   Problem: Pain Management: Goal: Pain level will decrease with appropriate interventions Outcome: Progressing   Problem: Education: Goal: Knowledge of General Education information will improve Description: Including pain rating scale, medication(s)/side effects and non-pharmacologic comfort measures Outcome: Progressing   Problem: Clinical Measurements: Goal: Respiratory complications will improve Outcome: Progressing   Problem: Clinical Measurements: Goal: Cardiovascular complication will be avoided Outcome: Progressing   Problem: Coping: Goal: Level of anxiety will decrease Outcome: Progressing

## 2021-10-18 NOTE — Progress Notes (Signed)
Pt stable at this time. No needs at time of d/c instructions and education. Pt dressing clean, dry and intact. Pt did not need DME. RN will continue to monitor

## 2021-10-18 NOTE — Discharge Summary (Signed)
Patient ID: Sarah Stevenson MRN: [id number] DOB/AGE: 66-Nov-1957 56 y.o.  Admit date: 10/17/2021 Discharge date: 10/18/2021  Admission Diagnoses:  Principal Problem:   Unilateral primary osteoarthritis, right hip Active Problems:   Status post total hip replacement, right   Discharge Diagnoses:  Same  Past Medical History:  Diagnosis Date   Anginal pain ([place])    Aortic stenosis    Coronary artery disease    Depression    "mild" (08/06/2016)   Dyspnea    Hyperlipidemia    lung ca dx'd 05/2018   Murmur, cardiac    Myocardial infarction ([place]) 2016   Osteoarthritis    "left hip and knee" (08/06/2016)   Type II diabetes mellitus ([place])     Surgeries: Procedure(s): TOTAL HIP ARTHROPLASTY ANTERIOR APPROACH on 10/17/2021   Consultants:   Discharged Condition: Improved  Hospital Course: MATAYA KILDUFF is an 66 y.o. female who was admitted 10/17/2021 for operative treatment ofUnilateral primary osteoarthritis, right hip. Patient has severe unremitting pain that affects sleep, daily activities, and work/hobbies. After pre-op clearance the patient was taken to the operating room on 10/17/2021 and underwent  Procedure(s): TOTAL HIP ARTHROPLASTY ANTERIOR APPROACH.    Patient was given perioperative antibiotics:  Anti-infectives (From admission, onward)    Start     Dose/Rate Route Frequency Ordered Stop   10/17/21 1700  ceFAZolin (ANCEF) IVPB 1 g/50 mL premix       Note to Pharmacy: Tolerated in surgery   1 g 100 mL/hr over 30 Minutes Intravenous Every 6 hours 10/17/21 1231 10/17/21 2249   10/17/21 0845  ceFAZolin (ANCEF) IVPB 2g/100 mL premix        2 g 200 mL/hr over 30 Minutes Intravenous On call to O.R. 10/17/21 4098 10/17/21 1113        Patient was given sequential compression devices, early ambulation, and chemoprophylaxis to prevent DVT.  Patient benefited maximally from hospital stay and there were no complications.    Recent vital signs: Patient Vitals for the  past 24 hrs:  BP Temp Temp src Pulse Resp SpO2 Height Weight  10/18/21 0516 (!) 104/55 98 F (36.7 C) Oral 71 17 95 % -- --  10/18/21 0102 118/61 97.9 F (36.6 C) Oral 75 17 97 % -- --  10/17/21 2106 (!) 112/56 98 F (36.7 C) Oral 74 16 94 % -- --  10/17/21 1759 118/62 98.2 F (36.8 C) Oral 77 18 94 % -- --  10/17/21 1629 121/61 98 F (36.7 C) Oral 75 18 94 % -- --  10/17/21 1426 115/65 97.8 F (36.6 C) Oral 69 16 98 % -- --  10/17/21 1330 101/85 -- -- 66 17 91 % -- --  10/17/21 1315 108/60 -- -- 66 10 95 % -- --  10/17/21 1300 101/67 -- -- 68 10 100 % -- --  10/17/21 1245 (!) 97/57 -- -- 68 18 93 % -- --  10/17/21 1230 95/60 -- -- 73 13 93 % -- --  10/17/21 1220 (!) 91/51 97.7 F (36.5 C) -- 70 11 99 % -- --  10/17/21 0912 -- -- -- -- -- -- 5\' 2"  (1.575 m) 88.5 kg  10/17/21 0855 133/70 98.1 F (36.7 C) Oral 70 18 97 % -- --     Recent laboratory studies:  Recent Labs    10/18/21 0312  WBC 13.5*  HGB 11.4*  HCT 35.4*  PLT 201  NA 138  K 4.4  CL 105  CO2 27  BUN 23  CREATININE  0.99  GLUCOSE 197*  CALCIUM 8.4*     Discharge Medications:   Allergies as of 10/18/2021       Reactions   Brilinta [ticagrelor] Shortness Of Breath   Lisinopril Cough   Asa [aspirin] Hives, Rash   Blue Dyes (parenteral) Hives   Corn-containing Products Hives   Nsaids Hives, Rash   Sugar-protein-starch Hives   Tea Hives   Rosuvastatin    myalgias   Zetia [ezetimibe]    myalgias   Isosulfan Blue Rash   Other Itching, Rash   CHG soap        Medication List     TAKE these medications    amoxicillin 500 MG capsule Commonly known as: AMOXIL Take 4 capsules by mouth 1 hour before appointment What changed:  how much to take how to take this when to take this additional instructions   apixaban 2.5 MG Tabs tablet Commonly known as: ELIQUIS Take 1 tablet (2.5 mg total) by mouth 2 (two) times daily.   carvedilol 6.25 MG tablet Commonly known as: COREG TAKE 1 TABLET  (6.25 MG TOTAL) BY MOUTH 2 (TWO) TIMES DAILY WITH A MEAL.   celecoxib 200 MG capsule Commonly known as: CELEBREX Take one capsule by mouth daily as needed for leg pain   cetirizine 10 MG tablet Commonly known as: ZYRTEC Take 10 mg by mouth daily.   COLLAGEN PO Take 1 Scoop by mouth daily. With biotin and vitamin c   famotidine 20 MG tablet Commonly known as: PEPCID Take 20 mg by mouth at bedtime.   fluocinonide ointment 0.05 % Commonly known as: LIDEX Apply to itchy lesions twice a day. What changed:  how much to take when to take this reasons to take this   fluticasone 50 MCG/ACT nasal spray Commonly known as: FLONASE Place 1 spray into both nostrils daily.   glipiZIDE 10 MG 24 hr tablet Commonly known as: GLUCOTROL XL TAKE 1 TABLET BY MOUTH DAILY FOR DIABETES CONTROL.   losartan 50 MG tablet Commonly known as: COZAAR TAKE 1 TABLET BY MOUTH DAILY TO PRESERVE KIDNEY FUNCTION.   metFORMIN 850 MG tablet Commonly known as: GLUCOPHAGE Take 1 tablet (850 mg total) by mouth 2 (two) times daily to control diabetes.   methocarbamol 500 MG tablet Commonly known as: ROBAXIN Take one tablet by mouth 4 times a day as needed for muscle   montelukast 10 MG tablet Commonly known as: SINGULAIR Take 1 tablet by mouth daily for allergy control   MUSCLE RUB EX Apply 1 application topically as needed (for pain).   OneTouch Ultra test strip Generic drug: glucose blood TEST BLOOD SUGAR ONCE A DAY   oxyCODONE 5 MG immediate release tablet Commonly known as: Oxy IR/ROXICODONE Take 1-2 tablets (5-10 mg total) by mouth every 6 (six) hours as needed for moderate pain (pain score 4-6).   prasugrel 10 MG Tabs tablet Commonly known as: EFFIENT Take 1 tablet (10 mg total) by mouth daily.   Repatha SureClick 353 MG/ML Soaj Generic drug: Evolocumab INJECT 140 MG INTO THE SKIN EVERY 14 DAYS   sertraline 50 MG tablet Commonly known as: ZOLOFT TAKE 1 TABLET BY MOUTH ONCE DAILY TO  CONTROL ANXIETY   Systane Complete 0.6 % Soln Generic drug: Propylene Glycol Place 2 drops into both eyes daily.   tiZANidine 4 MG tablet Commonly known as: Zanaflex Take 1 tablet (4 mg total) by mouth every 8 (eight) hours as needed for muscle spasms.   Trulicity 6.14 ER/1.5QM Sopn Generic  drug: Dulaglutide Inject 0.75 mg into the skin every Wednesday.   Trulicity 1.5 HY/0.7PX Sopn Generic drug: Dulaglutide Inject 0.5 mLs (1.5 mg total) into the skin once a week for diabetes   Tylenol Dissolve Packs 500 MG Pack Generic drug: Acetaminophen Take 1,000 mg by mouth every 6 (six) hours as needed (pain).   acetaminophen 325 MG tablet Commonly known as: TYLENOL Take 650 mg by mouth every 6 (six) hours as needed for moderate pain.               Durable Medical Equipment  (From admission, onward)           Start     Ordered   10/17/21 1435  DME 3 n 1  Once        10/17/21 1434   10/17/21 1435  DME Walker rolling  Once       Question Answer Comment  Walker: With 5 Inch Wheels   Patient needs a walker to treat with the following condition Status post total replacement of right hip      10/17/21 1434            Diagnostic Studies: DG Pelvis Portable  Result Date: 10/17/2021 CLINICAL DATA:  Postop for right hip arthroplasty. EXAM: PORTABLE PELVIS 1-2 VIEWS COMPARISON:  04/23/2017 FINDINGS: AP view of the pelvis demonstrates a new right hip arthroplasty, without acute hardware complication. Remote left hip arthroplasty. IMPRESSION: Expected appearance after interval right hip arthroplasty. Electronically Signed   By: Abigail Miyamoto M.D.   On: 10/17/2021 13:52   DG C-Arm 1-60 Min-No Report  Result Date: 10/17/2021 CLINICAL DATA:  Intraoperative anterior hip replacement. EXAM: DG HIP (WITH OR WITHOUT PELVIS) 1V RIGHT; DG C-ARM 1-60 MIN-NO REPORT COMPARISON:  July 30, 2021. FINDINGS: Six intraoperative limited fluoroscopic views are provided, low AP pelvis and then  multiple views of the RIGHT hip some prior to hip arthroplasty and some following hip arthroplasty. Initial images with severe RIGHT hip degenerative changes. Subsequent images show interval RIGHT hip arthroplasty with insertion of femoral and acetabular component showing no unexpected findings on limited intraoperative fluoroscopic images. Postoperative changes about the RIGHT hip are present in the AP and lateral projection. Gas is present in the soft tissues adjacent to the RIGHT hip. Incidental note is made of prior LEFT hip arthroplasty which is incompletely imaged. Fluoroscopic time: 20 seconds IMPRESSION: Intraoperative fluoroscopic views during RIGHT hip arthroplasty as described. No immediate complicating features. Electronically Signed   By: Zetta Bills M.D.   On: 10/17/2021 12:22   DG HIP UNILAT WITH PELVIS 1V RIGHT  Result Date: 10/17/2021 CLINICAL DATA:  Intraoperative anterior hip replacement. EXAM: DG HIP (WITH OR WITHOUT PELVIS) 1V RIGHT; DG C-ARM 1-60 MIN-NO REPORT COMPARISON:  July 30, 2021. FINDINGS: Six intraoperative limited fluoroscopic views are provided, low AP pelvis and then multiple views of the RIGHT hip some prior to hip arthroplasty and some following hip arthroplasty. Initial images with severe RIGHT hip degenerative changes. Subsequent images show interval RIGHT hip arthroplasty with insertion of femoral and acetabular component showing no unexpected findings on limited intraoperative fluoroscopic images. Postoperative changes about the RIGHT hip are present in the AP and lateral projection. Gas is present in the soft tissues adjacent to the RIGHT hip. Incidental note is made of prior LEFT hip arthroplasty which is incompletely imaged. Fluoroscopic time: 20 seconds IMPRESSION: Intraoperative fluoroscopic views during RIGHT hip arthroplasty as described. No immediate complicating features. Electronically Signed   By: Jewel Baize.D.  On: 10/17/2021 12:22     Disposition: Discharge disposition: 01-Home or Self Care       Discharge Instructions     Discharge patient   Complete by: As directed    Can discharge to home in the afternoon.   Discharge disposition: 01-Home or Self Care   Discharge patient date: 10/18/2021        Follow-up Information     Mcarthur Rossetti, MD Follow up in 2 week(s).   Specialty: Orthopedic Surgery Contact information: [address] [place] [address] [phone]                  Signed: Mcarthur Rossetti 10/18/2021, 8:10 AM

## 2021-10-20 ENCOUNTER — Other Ambulatory Visit (HOSPITAL_BASED_OUTPATIENT_CLINIC_OR_DEPARTMENT_OTHER): Payer: Self-pay

## 2021-10-21 ENCOUNTER — Encounter (HOSPITAL_COMMUNITY): Payer: Self-pay | Admitting: Orthopaedic Surgery

## 2021-10-30 ENCOUNTER — Encounter: Payer: Self-pay | Admitting: Orthopaedic Surgery

## 2021-10-30 ENCOUNTER — Ambulatory Visit (INDEPENDENT_AMBULATORY_CARE_PROVIDER_SITE_OTHER): Payer: PPO | Admitting: Orthopaedic Surgery

## 2021-10-30 ENCOUNTER — Other Ambulatory Visit (HOSPITAL_BASED_OUTPATIENT_CLINIC_OR_DEPARTMENT_OTHER): Payer: Self-pay

## 2021-10-30 DIAGNOSIS — Z96641 Presence of right artificial hip joint: Secondary | ICD-10-CM

## 2021-10-30 MED ORDER — OXYCODONE HCL 5 MG PO TABS
5.0000 mg | ORAL_TABLET | Freq: Four times a day (QID) | ORAL | 0 refills | Status: DC | PRN
  Filled 2021-10-30: qty 30, 8d supply, fill #0

## 2021-10-30 NOTE — Progress Notes (Signed)
The patient is 2 weeks status post a right total hip arthroplasty.  She has remote history of her left hip being replaced.  She is dealing with some sciatica but overall is making good progress.  Home health therapy has been coming out and she denies any significant concerns.  She has been on Eliquis and another blood thinner.  We will have her stop Eliquis.  I did remove the staples from her right hip incision in place Steri-Strips.  There is a hematoma but no seroma.  Her calf is soft.  Her leg lengths appear equal.  I did send in some more oxycodone for her.  She is using these sparingly.  She is cleared to drive when she does not have a narcotic in her system.  We will see her back in 4 weeks to see how she is doing overall but no x-rays are needed.  All questions and concerns were answered and addressed.

## 2021-11-11 ENCOUNTER — Other Ambulatory Visit: Payer: Self-pay | Admitting: Interventional Cardiology

## 2021-11-11 ENCOUNTER — Other Ambulatory Visit (HOSPITAL_BASED_OUTPATIENT_CLINIC_OR_DEPARTMENT_OTHER): Payer: Self-pay

## 2021-11-11 MED ORDER — CARVEDILOL 6.25 MG PO TABS
ORAL_TABLET | Freq: Two times a day (BID) | ORAL | 1 refills | Status: DC
  Filled 2021-11-11: qty 180, 90d supply, fill #0
  Filled 2022-02-16: qty 180, 90d supply, fill #1

## 2021-11-11 MED ORDER — METFORMIN HCL 850 MG PO TABS
ORAL_TABLET | ORAL | 0 refills | Status: DC
  Filled 2021-11-11: qty 180, 90d supply, fill #0

## 2021-11-27 ENCOUNTER — Ambulatory Visit (INDEPENDENT_AMBULATORY_CARE_PROVIDER_SITE_OTHER): Payer: PPO | Admitting: Orthopaedic Surgery

## 2021-11-27 ENCOUNTER — Encounter: Payer: Self-pay | Admitting: Orthopaedic Surgery

## 2021-11-27 ENCOUNTER — Other Ambulatory Visit (HOSPITAL_BASED_OUTPATIENT_CLINIC_OR_DEPARTMENT_OTHER): Payer: Self-pay

## 2021-11-27 DIAGNOSIS — Z96641 Presence of right artificial hip joint: Secondary | ICD-10-CM

## 2021-11-27 MED ORDER — DOXYCYCLINE HYCLATE 100 MG PO TABS
100.0000 mg | ORAL_TABLET | Freq: Two times a day (BID) | ORAL | 0 refills | Status: DC
  Filled 2021-11-27: qty 20, 10d supply, fill #0

## 2021-11-27 MED ORDER — OXYCODONE HCL 5 MG PO TABS
5.0000 mg | ORAL_TABLET | Freq: Four times a day (QID) | ORAL | 0 refills | Status: DC | PRN
  Filled 2021-11-27: qty 30, 8d supply, fill #0

## 2021-11-27 NOTE — Progress Notes (Signed)
The patient is a 66 year old female is now 6 weeks status post a right total hip arthroplasty.  She came in today needing me to look at her right hip incision. ? ?There is a small area of dehiscence at the top of the incision that is irritated but there is no evidence of infection.  This is at the groin crease.  Overall she does look good.  She has been dealing with some left shoulder pain recently in the trapezius area as well.  She does have a trigger point area on the trapezius that is bugging her.  She is a diabetic and given that I want her to be further out from surgery I recommended no steroid injection in that area today.  I will at least refill her pain medicine.  I do feel is appropriate for her to place Bactroban ointment on the right hip incision daily after each shower.  I will send in a short course of doxycycline as well. ? ?I would like to see her back in 2 weeks for repeat wound check on her right hip incision.  If she still having trigger point pain in the left trapezius area we may consider steroid injection then. ?

## 2021-12-09 ENCOUNTER — Other Ambulatory Visit: Payer: Self-pay | Admitting: Interventional Cardiology

## 2021-12-09 ENCOUNTER — Other Ambulatory Visit (HOSPITAL_BASED_OUTPATIENT_CLINIC_OR_DEPARTMENT_OTHER): Payer: Self-pay

## 2021-12-09 MED ORDER — REPATHA SURECLICK 140 MG/ML ~~LOC~~ SOAJ
SUBCUTANEOUS | 3 refills | Status: DC
  Filled 2021-12-09: qty 6, fill #0

## 2021-12-11 ENCOUNTER — Encounter: Payer: Self-pay | Admitting: Physician Assistant

## 2021-12-11 ENCOUNTER — Ambulatory Visit (INDEPENDENT_AMBULATORY_CARE_PROVIDER_SITE_OTHER): Payer: PPO | Admitting: Physician Assistant

## 2021-12-11 DIAGNOSIS — Z96641 Presence of right artificial hip joint: Secondary | ICD-10-CM

## 2021-12-11 NOTE — Progress Notes (Signed)
? ?+ ? ? ? ? ? ? ? ? ? ? ? ? ? ? ? ? ? ? ? ? ? ? ? ? ? ? ? ? ? ? ? ? ? ? ? ? ? ? ? ? ? ? ? ? ? ? ? ? ? ? ? ? ? ? ? ? ? ? ? ? ? ? ? ? ? ? ? ? ? ? ? ? ? ? ? ? ? ? ? ? ? ? ? ? ? ? ? ? ? ? ? ? ? ? ? ? ? ? ? ? ? ? ? ? ? ? ? ? ? ? ? ? ? ? ? ? ? ? ? ? ? ? ? ? ? ? ? ? ? ? ? ? ? ? ? ? ? ? ? ? ? ? ? ? ? ? ? ? ? ? ? ? ? ? ? ? ? ? ? ? ? ? ? ? ? ? ? ? ? ? ? ? ? ? ? ? ? ? ? ? ? ? ? ? ? ? ? ? ? ? ? ? ? ? ? ? ? ? ? ? ? ? ? ? ? ? ? ? ? ? ? ? ? ? ? ? ? ? ? ? ? ? ? ? ? ? ? ? ? ? ? ? ? ? ? ? ? ? ? ? ? ? ? ? ? ? ? ? ? ? ? ? ? ? ? ? ? ? ? ? ? ? ? ? ? ? ? ? ? ? ? ? ? ? ? ? ? ? ? ? ? ? ? ? ? ? ? ? ? ? ? ? ? ? ? ? ? ? ? ? ? ? ? ? ? ? ? ? ? ? ? ? ? ? ? ? ? ? ? ? ? ? ? ? ? ? ? ? ? ? ? ? ? ? ? ? ? ? ? ? ? ? ? ? ? ? ? ? ? ? ? ? ? ? ? ? ? ? ? ? ? ? ? ? ? ? ? ? ? ? ? ? ? ? ? ? ? ? ? ? ? ? ? ? ? ? ? ? ? ? ? ? ? ? ? ? ? ? ? ? ? ? ? ? ? ? ? ? ? ? ? ? ? ? ? ? ? ? ? ? ? ? ? ? ? ? ? ? ? ? ? ? ? ? ? ? ? ? ? ? ? ? ? ? ? ? ? ? ? ? ? ? ? ? ? ? ? ? ? ? ? ? ? ? ? ? ? ? ? ? ? ? ? ? ? ? ? ? ? ? ? ? ? ? ? ? ? ? ? ? ? ? ? ? ? ? ? ? ? ? ? ? ? ? ? ? ? ? ? ? ? ? ? ? ? ? ? ? ? ? ? ? ? ? ? ? ? ? ? ? ? ? ? ? ? ? ? ? ? ? ? ? ? ? ? ? ? ? ? ? ? ? ? ? ? ? ? ? ? ? ? ? ? ? ? ? ? ? ? ? ? ? ? ? ? ? ? ? ? ? ? ? ? ? ? ? ? ? ? ? ? ? ? ? ? ? ? ? ? ? ? ? ? ? ? ? ? ? ? ? ? ? ? ? ? ? ? ? ? ? ? ? ? ? ? ? ? ? ? ? ? ? ? ? ? ? ? ? ? ? ? ? ? ? ? ? ? ? ? ? ? ? ? ? ? ? ? ? ? ? ? ? ? ? ? ? ? ? ? ? ? ? ? ? ? ? ? ? ? ? ? ? ? ? ? ? ? ? ? ? ? ? ? ? ? ? ? ? ? ? ? ? ? ? ? ? ? ? ? ? ? ? ? ? ? ? ? ? ? ? ? ? ? ? ? ? ? ? ? ? ? ? ? ? ? ? ? ? ? ? ? ? ? ? ? ? ? ? ? ? ? ? ? ? ? ? ? ? ? ? ? ? ? ? ? ? ? ? ? ? ? ? ? ? ? ? ? ? ? ? ? ? ? ? ? ? ? ? ? ? ? ? ? ? ? ? ? ? ? ? ? ? ? ? ? ? ? ? ? ? ? ? ? ? ? ? ? ? ? ? ? ? ? ? ? ? ? ? ? ? ? ? ? ? ? ? ? ? ? ? ? ? ? ? ? ? ? ? ? ? ? ? ? ? ? ? ? ? ? ? ? ? ? ? ? ? ? ? ? ? ? ? ? ? ? ? ? ? ? ? ? ? ? ? ? ? ? ? ? ? ? ? ? ? ? ? ? ? ? ? ? ? ? ? ? ? ? ? ? ? ? ? ? ? ? ? ? ? ? ? ? ? ? ? ? ? ? ? ? ? ? ? ? ? ? ? ? ? ? ? ? ? ? ? ? ? ? ? ? ? ? ? ? ? ? ? ? ? ? ? ? ? ? ? ? ? ? ? ? ? ? ?  ? ? ? ? ? ? ? ? ? ? ? ? ? ? ? ? ? ? ? ? ? ? ? ? ? ? ? ? ? ? ? ? ? ? ? ? ? ? ? ? ? ? ? ? ? ? ? ? ? ? ? ? ? ? ? ? ? ? ? ? ? ? ? ? ? ? ? ? ? ? ? ? ? ? ? ? ? ? ? ? ? ? ? ? ? ? ? ? ? ? ? ? ? ? ? ? ? ? ? ? ? ? ? ? ? ? ? ? ? ? ? ? ? ? ? ? ? ? ? ? ? ? ? ? ? ? ? ? ? ? ? ? ? ? ? ? ? ? ? ? ? ? ? ? ? ? ? ? ? ? ? ? ? ? ? ? ? ? ? ? ? ? ? ? ? ? ? ? ? ? ? ? ? ? ? ? ? ? ? ? ? ? ? ? ? ? ? ? ? ? ? ? ? ? ? ? ? ? ? ?  HPI Sarah Stevenson returns today status post right total hip arthroplasty 10/17/2021.  She is here for wound check.  She also has been having some pain in her left shoulder.  She states that the incision is healing well.  She stopped the doxycycline as of yesterday she finished the prescription.  She states her glucose levels are running between 95 and 115.  In regards to her shoulder she states that the shoulder pain is better.  She only has pain in shoulder 60 to 70% of the time now.  She is taking Tylenol occasionally.  Does have some radicular symptoms down the arm.  No neck pain. ? ?Physical exam: Right hip surgical incisions healing well.  There is a scab over the proximal one fourth of the incision.  There is no signs of gross infection no purulence. ?Good range of motion of the right hip without pain.  Ambulates without any assistive device  Dorsiflexion plantarflexion right ankle intact. ? ?Impression: Status post right total hip arthroplasty ?Left scapular pain ? ?Plan: At this point time recommend the use of Bactroban over the proximal incision.  She will wash the area with antibacterial soap.  We will see her back in 3 weeks to see how she is doing overall with regards to the hip incision also to check her scapular pain.  At this point time she will continue Tylenol.  Continue TENS unit which she has at home.  Offered formal therapy she states she would just do some home exercises for her neck and her shoulder.  Questions were encouraged and answered at  length. ? ? ? ? ? ? ? ? ? ? ? ? ? ? ? ? ? ? ? ? ? ? ? ? ? ? ? ? ? ? ? ? ?

## 2021-12-12 ENCOUNTER — Other Ambulatory Visit (HOSPITAL_BASED_OUTPATIENT_CLINIC_OR_DEPARTMENT_OTHER): Payer: Self-pay

## 2021-12-12 ENCOUNTER — Other Ambulatory Visit: Payer: Self-pay | Admitting: Interventional Cardiology

## 2021-12-17 ENCOUNTER — Other Ambulatory Visit (HOSPITAL_BASED_OUTPATIENT_CLINIC_OR_DEPARTMENT_OTHER): Payer: Self-pay

## 2021-12-17 MED ORDER — REPATHA SURECLICK 140 MG/ML ~~LOC~~ SOAJ
SUBCUTANEOUS | 3 refills | Status: DC
  Filled 2022-02-16: qty 6, 84d supply, fill #0
  Filled 2022-05-20: qty 6, 84d supply, fill #1
  Filled 2022-08-20: qty 6, 84d supply, fill #2
  Filled 2022-11-22: qty 6, 84d supply, fill #3

## 2021-12-22 ENCOUNTER — Other Ambulatory Visit (HOSPITAL_BASED_OUTPATIENT_CLINIC_OR_DEPARTMENT_OTHER): Payer: Self-pay

## 2022-01-01 ENCOUNTER — Ambulatory Visit (INDEPENDENT_AMBULATORY_CARE_PROVIDER_SITE_OTHER): Payer: PPO | Admitting: Physician Assistant

## 2022-01-01 ENCOUNTER — Encounter: Payer: Self-pay | Admitting: Physician Assistant

## 2022-01-01 DIAGNOSIS — Z96641 Presence of right artificial hip joint: Secondary | ICD-10-CM

## 2022-01-01 NOTE — Progress Notes (Signed)
HPI: Mrs. Mansten returns today for follow-up status post right total hip arthroplasty 10/17/2021.  She states her hip is overall doing well.  She states her incisions well-healed.  She also had some scapular pain which she states is gotten better taking her Celebrex.  She does note occasional tingling down the left arm but notes no neck pain. ? ? ?Physical exam: Right hip full range of motion without pain.  Surgical incisions well-healed.  Calf supple nontender.  Dorsiflexion plantarflexion right ankle intact. ?Left shoulder she has good range of motion without pain today. ? ? ?Impression: ?Impression status post right total hip arthroplasty 10/17/2021 ?Left shoulder and radicular pain ? ?Plan: Due to the fact that there is scapular with radicular pain down the left arm is improving we will have her follow-up with pain persist or becomes worse.  Regards to the right hip she will work on scar tissue mobilization.  We will see her back in 1 year postop and at that time we will obtain AP pelvis and lateral view of the right hip.  We will see her sooner if there is any questions or concerns. ? ? ? ?

## 2022-01-15 ENCOUNTER — Other Ambulatory Visit (HOSPITAL_BASED_OUTPATIENT_CLINIC_OR_DEPARTMENT_OTHER): Payer: Self-pay

## 2022-01-15 MED ORDER — MONTELUKAST SODIUM 10 MG PO TABS
ORAL_TABLET | ORAL | 5 refills | Status: DC
  Filled 2022-01-15: qty 30, 30d supply, fill #0
  Filled 2022-02-16: qty 30, 30d supply, fill #1
  Filled 2022-03-24: qty 30, 30d supply, fill #2
  Filled 2022-04-21: qty 30, 30d supply, fill #3
  Filled 2022-05-20: qty 30, 30d supply, fill #4

## 2022-01-16 ENCOUNTER — Other Ambulatory Visit (HOSPITAL_BASED_OUTPATIENT_CLINIC_OR_DEPARTMENT_OTHER): Payer: Self-pay

## 2022-02-16 ENCOUNTER — Other Ambulatory Visit (HOSPITAL_BASED_OUTPATIENT_CLINIC_OR_DEPARTMENT_OTHER): Payer: Self-pay

## 2022-02-16 MED ORDER — GLIPIZIDE ER 10 MG PO TB24
ORAL_TABLET | ORAL | 0 refills | Status: DC
  Filled 2022-02-16: qty 90, 90d supply, fill #0

## 2022-02-16 MED ORDER — METFORMIN HCL 850 MG PO TABS
ORAL_TABLET | ORAL | 0 refills | Status: DC
  Filled 2022-02-16: qty 180, 90d supply, fill #0

## 2022-02-17 ENCOUNTER — Other Ambulatory Visit (HOSPITAL_BASED_OUTPATIENT_CLINIC_OR_DEPARTMENT_OTHER): Payer: Self-pay

## 2022-03-06 ENCOUNTER — Other Ambulatory Visit (HOSPITAL_BASED_OUTPATIENT_CLINIC_OR_DEPARTMENT_OTHER): Payer: Self-pay

## 2022-03-06 MED ORDER — METHOCARBAMOL 500 MG PO TABS
ORAL_TABLET | ORAL | 2 refills | Status: DC
  Filled 2022-03-06: qty 40, 10d supply, fill #0

## 2022-03-06 MED ORDER — TRULICITY 1.5 MG/0.5ML ~~LOC~~ SOAJ
SUBCUTANEOUS | 3 refills | Status: DC
  Filled 2022-07-22: qty 6, 84d supply, fill #0
  Filled 2022-10-20 (×2): qty 2, 28d supply, fill #1
  Filled 2022-11-22: qty 2, 28d supply, fill #2
  Filled 2022-12-30: qty 2, 28d supply, fill #3
  Filled 2023-01-24: qty 2, 28d supply, fill #4
  Filled 2023-03-05: qty 2, 28d supply, fill #5

## 2022-03-06 MED ORDER — CELECOXIB 200 MG PO CAPS
200.0000 mg | ORAL_CAPSULE | Freq: Every day | ORAL | 3 refills | Status: DC | PRN
  Filled 2022-08-20: qty 90, 90d supply, fill #0
  Filled 2022-11-22: qty 90, 90d supply, fill #1
  Filled 2023-03-05: qty 90, 90d supply, fill #2

## 2022-03-06 MED ORDER — METFORMIN HCL 850 MG PO TABS
ORAL_TABLET | ORAL | 3 refills | Status: DC
  Filled 2022-05-20: qty 180, 90d supply, fill #0
  Filled 2022-08-20: qty 180, 90d supply, fill #1
  Filled 2022-11-22: qty 180, 90d supply, fill #2
  Filled 2023-03-05: qty 180, 90d supply, fill #3

## 2022-03-06 MED ORDER — MONTELUKAST SODIUM 10 MG PO TABS
10.0000 mg | ORAL_TABLET | Freq: Every day | ORAL | 5 refills | Status: DC
  Filled 2022-06-22: qty 30, 30d supply, fill #0
  Filled 2022-07-22: qty 30, 30d supply, fill #1
  Filled 2022-08-20: qty 30, 30d supply, fill #2
  Filled 2022-12-30: qty 30, 30d supply, fill #3

## 2022-03-16 ENCOUNTER — Other Ambulatory Visit (HOSPITAL_BASED_OUTPATIENT_CLINIC_OR_DEPARTMENT_OTHER): Payer: Self-pay

## 2022-03-16 MED ORDER — GLIPIZIDE ER 10 MG PO TB24
ORAL_TABLET | ORAL | 3 refills | Status: DC
  Filled 2022-05-20: qty 90, 90d supply, fill #0
  Filled 2022-08-20: qty 90, 90d supply, fill #1
  Filled 2022-11-22: qty 90, 90d supply, fill #2
  Filled 2023-03-05: qty 90, 90d supply, fill #3

## 2022-03-24 ENCOUNTER — Other Ambulatory Visit (HOSPITAL_BASED_OUTPATIENT_CLINIC_OR_DEPARTMENT_OTHER): Payer: Self-pay

## 2022-03-27 ENCOUNTER — Other Ambulatory Visit (HOSPITAL_BASED_OUTPATIENT_CLINIC_OR_DEPARTMENT_OTHER): Payer: Self-pay

## 2022-03-27 MED ORDER — AMOXICILLIN 500 MG PO CAPS
ORAL_CAPSULE | ORAL | 0 refills | Status: DC
  Filled 2022-03-27: qty 16, 4d supply, fill #0

## 2022-04-21 ENCOUNTER — Other Ambulatory Visit (HOSPITAL_BASED_OUTPATIENT_CLINIC_OR_DEPARTMENT_OTHER): Payer: Self-pay

## 2022-04-21 MED ORDER — LOSARTAN POTASSIUM 50 MG PO TABS
ORAL_TABLET | ORAL | 3 refills | Status: DC
Start: 2022-04-21 — End: 2022-06-16
  Filled 2022-04-21: qty 90, 90d supply, fill #0

## 2022-04-22 ENCOUNTER — Other Ambulatory Visit (HOSPITAL_BASED_OUTPATIENT_CLINIC_OR_DEPARTMENT_OTHER): Payer: Self-pay

## 2022-05-20 ENCOUNTER — Other Ambulatory Visit: Payer: Self-pay | Admitting: Interventional Cardiology

## 2022-05-20 ENCOUNTER — Other Ambulatory Visit (HOSPITAL_BASED_OUTPATIENT_CLINIC_OR_DEPARTMENT_OTHER): Payer: Self-pay

## 2022-05-20 MED ORDER — CARVEDILOL 6.25 MG PO TABS
6.2500 mg | ORAL_TABLET | Freq: Two times a day (BID) | ORAL | 0 refills | Status: DC
  Filled 2022-05-20: qty 60, 30d supply, fill #0

## 2022-05-21 ENCOUNTER — Other Ambulatory Visit (HOSPITAL_BASED_OUTPATIENT_CLINIC_OR_DEPARTMENT_OTHER): Payer: Self-pay

## 2022-06-15 NOTE — Progress Notes (Unsigned)
Cardiology Office Note:    Date:  06/15/2022   ID:  Sarah Stevenson, DOB Mar 03, 1956, MRN [id number]  PCP:  Sarah Sacramento, MD  [place] Providers Cardiologist:  Sarah Grooms, MD { Click to update primary MD,subspecialty MD or APP then REFRESH:1}  *** Referring MD: Sarah Sacramento, MD   Chief Complaint:  No chief complaint on file. {Click here for Visit Info    :1}    History of Present Illness:   Sarah Stevenson is a 66 y.o. female with a hx of CAD with prior RCA DES and severe diagonal 2 disease, and aortic stenosis treated with CABG and AVR (2019), lung cancer diagnosed during CABG, DM II, hyperlipidemia, and depression.     Patient last saw Dr. Tamala Stevenson 06/03/21 doing well. Recommended echo be done 05/2022.    Past Medical History:  Diagnosis Date   Anginal pain ([place])    Aortic stenosis    Coronary artery disease    Depression    "mild" (08/06/2016)   Dyspnea    Hyperlipidemia    lung ca dx'd 05/2018   Murmur, cardiac    Myocardial infarction [facility]) 2016   Osteoarthritis    "left hip and knee" (08/06/2016)   Type II diabetes mellitus ([place])    Current Medications: No outpatient medications have been marked as taking for the 06/16/22 encounter (Appointment) with Sarah Burn, PA-C.    Allergies:   Brilinta [ticagrelor], Lisinopril, Asa [aspirin], Blue dyes (parenteral), Corn-containing products, Nsaids, Tea, Rosuvastatin, Zetia [ezetimibe], Isosulfan blue, and Other   Social History   Tobacco Use   Smoking status: Former    Packs/day: 1.00    Years: 10.00    Total pack years: 10.00    Types: Cigarettes    Quit date: 2013    Years since quitting: 10.7   Smokeless tobacco: Never   Tobacco comments:    restarted  about 5-6 yrs ago, then stopped  Vaping Use   Vaping Use: Former  Substance Use Topics   Alcohol use: Not Currently    Comment: Rare   Drug use: No    Family Hx: The patient's family history includes Breast cancer in her maternal  aunt; Dementia in her mother; Heart attack in her father and mother; Sudden death in her father; Thyroid disease in her mother. There is no history of Hypertension, Hyperlipidemia, or Diabetes.  ROS     Physical Exam:    VS:  There were no vitals taken for this visit.    Wt Readings from Last 3 Encounters:  10/17/21 195 lb (88.5 kg)  10/08/21 195 lb (88.5 kg)  07/30/21 198 lb 12.8 oz (90.2 kg)    Physical Exam  GEN: Well nourished, well developed, in no acute distress  HEENT: normal  Neck: no JVD, carotid bruits, or masses Cardiac:RRR; no murmurs, rubs, or gallops  Respiratory:  clear to auscultation bilaterally, normal work of breathing GI: soft, nontender, nondistended, + BS Ext: without cyanosis, clubbing, or edema, Good distal pulses bilaterally MS: no deformity or atrophy  Skin: warm and dry, no rash Neuro:  Alert and Oriented x 3, Strength and sensation are intact Psych: euthymic mood, full affect        EKGs/Labs/Other Test Reviewed:    EKG:  EKG is *** ordered today.  The ekg ordered today demonstrates ***  Recent Labs: 07/14/2021: ALT 12 10/18/2021: BUN 23; Creatinine, Ser 0.99; Hemoglobin 11.4; Platelets 201; Potassium 4.4; Sodium 138   Recent  Lipid Panel No results for input(s): "CHOL", "TRIG", "HDL", "VLDL", "LDLCALC", "LDLDIRECT" in the last 8760 hours.   Prior CV Studies: ECHO COMPLETE WO IMAGING ENHANCING AGENT 05/22/2019  Narrative ECHOCARDIOGRAM REPORT    Patient Name:   Sarah Stevenson Date of Exam: 05/22/2019 Medical Rec #:  [id number]        Height:       62.0 in Accession #:    [id number]       Weight:       200.0 lb Date of Birth:  04/11/1956        BSA:          1.91 m Patient Age:    66 years         BP:           140/83 mmHg Patient Gender: F                HR:           74 bpm. Exam Location:  [place]   Procedure: 2D Echo, Cardiac Doppler and Color Doppler  Indications:    Z95.2 H/O AVR  History:        Patient has prior  history of Echocardiogram examinations, most recent 06/16/2018. CAD Prior CABG and Prior Cardiac Surgery. Aortic Valve: A 71mm pericardial aortic valve bioprosthesis Procedure Date: 05/17/2018  Sonographer:    Sarah Stevenson RCS Referring Phys: [place]   1. The left ventricle has normal systolic function with an ejection fraction of 60-65%. The cavity size was normal. There is mild concentric left ventricular hypertrophy. Left ventricular diastolic Doppler parameters are consistent with pseudonormalization. Elevated left atrial and left ventricular end-diastolic pressures The E/e' is 16. No evidence of left ventricular regional wall motion abnormalities. 2. Left atrial size was mildly dilated. 3. Mild thickening of the mitral valve leaflet. There is moderate mitral annular calcification present. No evidence of mitral valve stenosis. 4. A 94mm pericardial bioprosthesis valve is present in the aortic position. Procedure Date: 05/17/2018 Normal aortic valve prosthesis. 5. Unchanged from prior of the aortic valve. 6. The aorta is normal unless otherwise noted. 7. The aortic root, ascending aorta and aortic arch are normal in size and structure.  SUMMARY  Normal LV systolic function with grade 2 diastolic dysfunction and E/e' of 16 suggesting elevated filling pressures. AVR is well seated, no regurgitation, gradient is the same as compared to prior study. FINDINGS Left Ventricle: The left ventricle has normal systolic function, with an ejection fraction of 60-65%. The cavity size was normal. There is mild concentric left ventricular hypertrophy. Left ventricular diastolic Doppler parameters are consistent with pseudonormalization. Elevated left atrial and left ventricular end-diastolic pressures The E/e' is 16. No evidence of left ventricular regional wall motion abnormalities..  Right Ventricle: The right ventricle has low normal systolic function. The cavity was  normal. There is no increase in right ventricular wall thickness. Right ventricular systolic pressure is normal with an estimated pressure of 16.5 mmHg.  Left Atrium: Left atrial size was mildly dilated.  Right Atrium: Right atrial size was normal in size. Right atrial pressure is estimated at 3 mmHg.  Interatrial Septum: No atrial level shunt detected by color flow Doppler.  Pericardium: There is no evidence of pericardial effusion.  Mitral Valve: The mitral valve is normal in structure. Mild thickening of the mitral valve leaflet. There is moderate mitral annular calcification present. Mitral valve regurgitation is trivial by color flow Doppler. No evidence of  mitral valve stenosis.  Tricuspid Valve: The tricuspid valve is normal in structure. Tricuspid valve regurgitation is trivial by color flow Doppler.  Aortic Valve: The aortic valve has been repaired/replaced Aortic valve regurgitation was not visualized by color flow Doppler. There is unchanged from prior of the aortic valve, with a calculated valve area of 0.91 cm. A 33mm pericardial aortic valve bioprosthesis valve is present in the aortic position. Procedure Date: 05/17/2018 Normal aortic valve prosthesis.  Pulmonic Valve: The pulmonic valve was grossly normal. Pulmonic valve regurgitation is not visualized by color flow Doppler. No evidence of pulmonic stenosis.  Aorta: The aortic root, ascending aorta and aortic arch are normal in size and structure. The aorta is normal unless otherwise noted.  Pulmonary Artery: The pulmonary artery is not well seen.  Venous: The inferior vena cava is normal in size with greater than 50% respiratory variability.   +--------------+--------++ LEFT VENTRICLE         +----------------+---------++ +--------------+--------++ Diastology                PLAX 2D                +----------------+---------++ +--------------+--------++ LV e' lateral:  6.64 cm/s LVIDd:        4.46 cm   +----------------+---------++ +--------------+--------++ LV E/e' lateral:14.8      LVIDs:        2.82 cm  +----------------+---------++ +--------------+--------++ LV e' medial:   5.55 cm/s LV PW:        1.24 cm  +----------------+---------++ +--------------+--------++ LV E/e' medial: 17.7      LV IVS:       1.19 cm  +----------------+---------++ +--------------+--------++ LVOT diam:    1.70 cm  +--------------+--------++ LV SV:        60 ml    +--------------+--------++ LV SV Index:  29.69    +--------------+--------++ LVOT Area:    2.27 cm +--------------+--------++                        +--------------+--------++  +---------------+---------++ RIGHT VENTRICLE          +---------------+---------++ RV Basal diam: 3.01 cm   +---------------+---------++ RV S prime:    9.90 cm/s +---------------+---------++ TAPSE (M-mode):1.5 cm    +---------------+---------++ RVSP:          16.5 mmHg +---------------+---------++  +---------------+-------++-----------++ LEFT ATRIUM           Index       +---------------+-------++-----------++ LA diam:       4.60 cm2.41 cm/m  +---------------+-------++-----------++ LA Vol (A2C):  60.6 ml31.70 ml/m +---------------+-------++-----------++ LA Vol (A4C):  60.6 ml31.70 ml/m +---------------+-------++-----------++ LA Biplane Vol:64.6 ml33.79 ml/m +---------------+-------++-----------++ +------------+---------++-----------++ RIGHT ATRIUM         Index       +------------+---------++-----------++ RA Pressure:3.00 mmHg            +------------+---------++-----------++ RA Area:    16.20 cm            +------------+---------++-----------++ RA Volume:  44.00 ml 23.02 ml/m +------------+---------++-----------++ +------------------+------------++ AORTIC VALVE                   +------------------+------------++ AV  Area (Vmax):   0.85 cm     +------------------+------------++ AV Area (Vmean):  0.88 cm     +------------------+------------++ AV Area (VTI):    0.91 cm     +------------------+------------++ AV Vmax:          272.00 cm/s  +------------------+------------++ AV Vmean:  197.000 cm/s +------------------+------------++ AV VTI:           0.638 m      +------------------+------------++ AV Peak Grad:     29.6 mmHg    +------------------+------------++ AV Mean Grad:     17.0 mmHg    +------------------+------------++ LVOT Vmax:        102.00 cm/s  +------------------+------------++ LVOT Vmean:       76.800 cm/s  +------------------+------------++ LVOT VTI:         0.257 m      +------------------+------------++ LVOT/AV VTI ratio:0.40         +------------------+------------++  +-------------+-------++ AORTA                +-------------+-------++ Ao Root diam:2.40 cm +-------------+-------++  +--------------+--------++    +---------------+-----------++ MITRAL VALVE              TRICUSPID VALVE            +--------------+--------++    +---------------+-----------++ MV Area (PHT):            TR Peak grad:  13.5 mmHg   +--------------+--------++    +---------------+-----------++ MV PHT:                   TR Vmax:       217.00 cm/s +--------------+--------++    +---------------+-----------++ MV Decel Time:208 msec    Estimated RAP: 3.00 mmHg   +--------------+--------++    +---------------+-----------++ +--------------+-----------++ RVSP:          16.5 mmHg   MV E velocity:98.50 cm/s  +---------------+-----------++ +--------------+-----------++ MV A velocity:110.00 cm/s +--------------+-------+ +--------------+-----------++ SHUNTS                MV E/A ratio: 0.90        +--------------+-------+ +--------------+-----------++ Systemic VTI: 0.26 m   +--------------+-------+ Systemic Diam:1.70 cm +--------------+-------+   Buford Dresser MD Electronically signed by Buford Dresser MD Signature Date/Time: 05/22/2019/7:40:54 PM    Final        Risk Assessment/Calculations/Metrics:   {Does this patient have ATRIAL FIBRILLATION?:[id number]}     No BP recorded.  {Refresh Note OR Click here to enter BP  :1}***    ASSESSMENT & PLAN:   No problem-specific Assessment & Plan notes found for this encounter.   CAD with prior RCA DES and severe diagonal 2 diseaseCABG and AVR (2019)  AVR 2019-due for f/u echo.  Chronic diastolic CHF  HLD  DM2      {Are you ordering a CV Procedure (e.g. stress test, cath, DCCV, TEE, etc)?   Press F2        :[id number]}   Dispo:  No follow-ups on file.   Medication Adjustments/Labs and Tests Ordered: Current medicines are reviewed at length with the patient today.  Concerns regarding medicines are outlined above.  Tests Ordered: No orders of the defined types were placed in this encounter.  Medication Changes: No orders of the defined types were placed in this encounter.  Signed, Ermalinda Barrios, PA-C  06/15/2022 1:32 PM    [facility] [place], [place], [place]  [address] Phone: [phone]; Fax: [phone]

## 2022-06-16 ENCOUNTER — Encounter: Payer: Self-pay | Admitting: Physician Assistant

## 2022-06-16 ENCOUNTER — Other Ambulatory Visit (HOSPITAL_BASED_OUTPATIENT_CLINIC_OR_DEPARTMENT_OTHER): Payer: Self-pay

## 2022-06-16 ENCOUNTER — Ambulatory Visit: Payer: PPO | Attending: Physician Assistant | Admitting: Physician Assistant

## 2022-06-16 VITALS — BP 100/60 | HR 76 | Ht 62.0 in | Wt 192.0 lb

## 2022-06-16 DIAGNOSIS — I2510 Atherosclerotic heart disease of native coronary artery without angina pectoris: Secondary | ICD-10-CM

## 2022-06-16 DIAGNOSIS — Z952 Presence of prosthetic heart valve: Secondary | ICD-10-CM | POA: Diagnosis not present

## 2022-06-16 DIAGNOSIS — I5032 Chronic diastolic (congestive) heart failure: Secondary | ICD-10-CM

## 2022-06-16 DIAGNOSIS — E118 Type 2 diabetes mellitus with unspecified complications: Secondary | ICD-10-CM

## 2022-06-16 DIAGNOSIS — E785 Hyperlipidemia, unspecified: Secondary | ICD-10-CM

## 2022-06-16 MED ORDER — LOSARTAN POTASSIUM 50 MG PO TABS
50.0000 mg | ORAL_TABLET | Freq: Every day | ORAL | 3 refills | Status: DC
  Filled 2022-06-16: qty 90, fill #0
  Filled 2022-07-22: qty 90, 90d supply, fill #0
  Filled 2022-10-20 (×2): qty 90, 90d supply, fill #1
  Filled 2023-01-24: qty 90, 90d supply, fill #2

## 2022-06-16 MED ORDER — CARVEDILOL 6.25 MG PO TABS
6.2500 mg | ORAL_TABLET | Freq: Two times a day (BID) | ORAL | 3 refills | Status: DC
  Filled 2022-06-16: qty 180, 90d supply, fill #0
  Filled 2022-10-02: qty 180, 90d supply, fill #1
  Filled 2022-12-30: qty 180, 90d supply, fill #2
  Filled 2023-04-05: qty 180, 90d supply, fill #3

## 2022-06-16 NOTE — Patient Instructions (Signed)
Medication Instructions:  Your physician recommends that you continue on your current medications as directed. Please refer to the Current Medication list given to you today.  *If you need a refill on your cardiac medications before your next appointment, please call your pharmacy*   Lab Work: None ordered  If you have labs (blood work) drawn today and your tests are completely normal, you will receive your results only by: [place] (if you have MyChart) OR A paper copy in the mail If you have any lab test that is abnormal or we need to change your treatment, we will call you to review the results.   Testing/Procedures: Your physician has requested that you have an echocardiogram. Echocardiography is a painless test that uses sound waves to create images of your heart. It provides your doctor with information about the size and shape of your heart and how well your heart's chambers and valves are working. This procedure takes approximately one hour. There are no restrictions for this procedure.    Follow-Up: At [facility], you and your health needs are our priority.  As part of our continuing mission to provide you with exceptional heart care, we have created designated Provider Care Teams.  These Care Teams include your primary Cardiologist (physician) and Advanced Practice Providers (APPs -  Physician Assistants and Nurse Practitioners) who all work together to provide you with the care you need, when you need it.  We recommend signing up for the patient portal called "MyChart".  Sign up information is provided on this After Visit Summary.  MyChart is used to connect with patients for Virtual Visits (Telemedicine).  Patients are able to view lab/test results, encounter notes, upcoming appointments, etc.  Non-urgent messages can be sent to your provider as well.   To learn more about what you can do with MyChart, go to [email or web address].    Your next appointment:    12 month(s)  The format for your next appointment:   In Person  Provider:   Dr. Gwyndolyn Kaufman    Other Instructions   Important Information About Sugar

## 2022-06-17 ENCOUNTER — Other Ambulatory Visit: Payer: Self-pay | Admitting: Family Medicine

## 2022-06-17 DIAGNOSIS — Z1231 Encounter for screening mammogram for malignant neoplasm of breast: Secondary | ICD-10-CM

## 2022-06-22 ENCOUNTER — Other Ambulatory Visit (HOSPITAL_BASED_OUTPATIENT_CLINIC_OR_DEPARTMENT_OTHER): Payer: Self-pay

## 2022-06-26 IMAGING — CT CT CHEST W/ CM
2 of 4 series · 15 of 36 positions shown, 18 images · IV contrast (OMNIPAQUE)
Comparison: 01/11/2020

CLINICAL DATA: Restaging non-small cell lung cancer. Status post
left upper lobe wedge resection.

EXAM:
CT CHEST WITH CONTRAST
TECHNIQUE: Multidetector CT imaging of the chest was performed during
intravenous contrast administration.
CONTRAST:  75mL OMNIPAQUE IOHEXOL 300 MG/ML  SOLN

[Series 2: axial st · axial · 0.77mm/px · z∈[+20,+262]mm · 12 of 143 slices shown, 15 images]
[im 11/143  mediastinal]
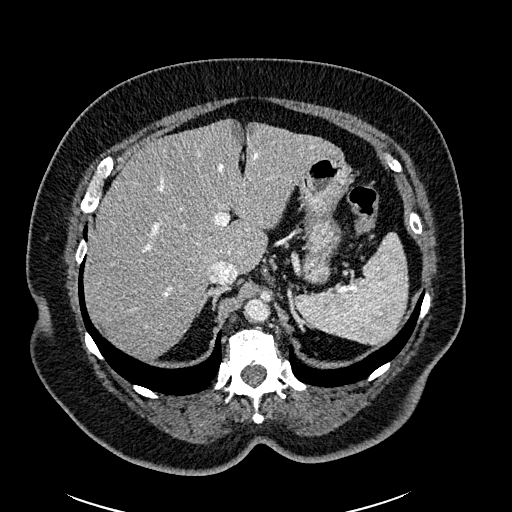
[im 11/143  lung]
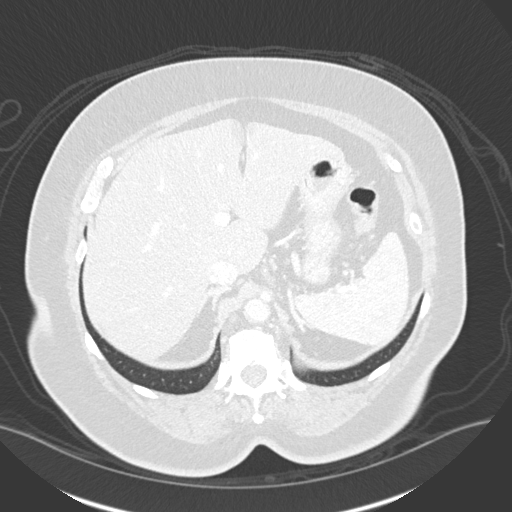
[im 22/143  lung]
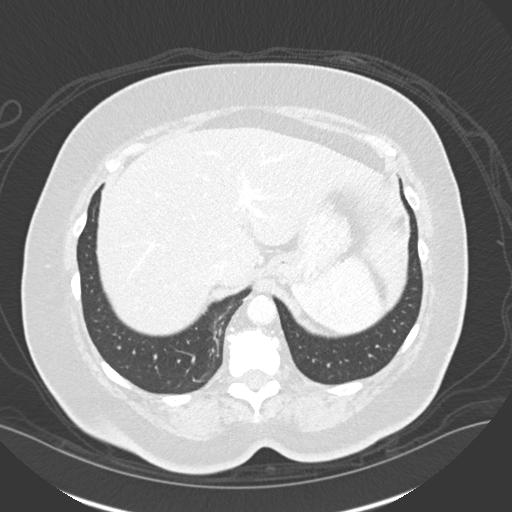
[im 33/143  lung]
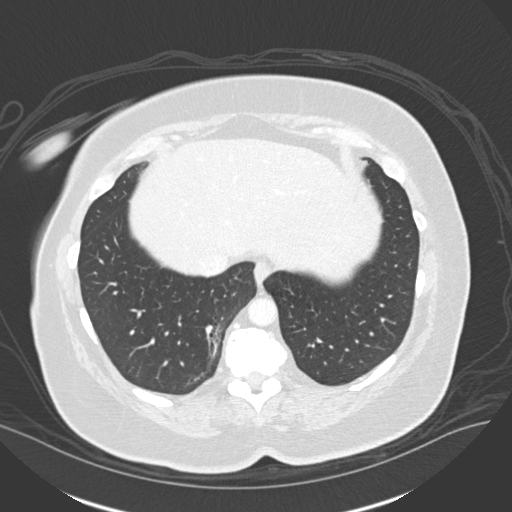
[im 44/143  lung]
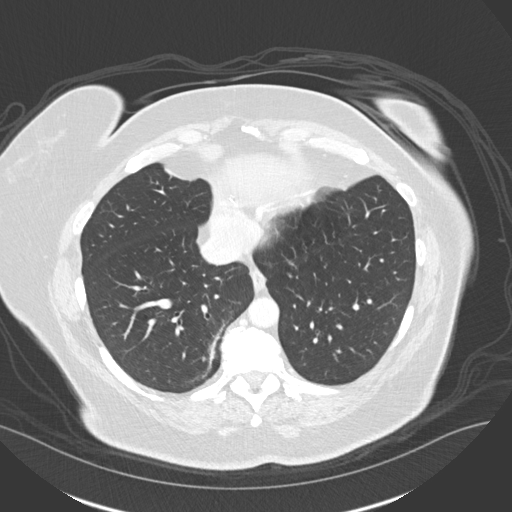
[im 55/143  mediastinal]
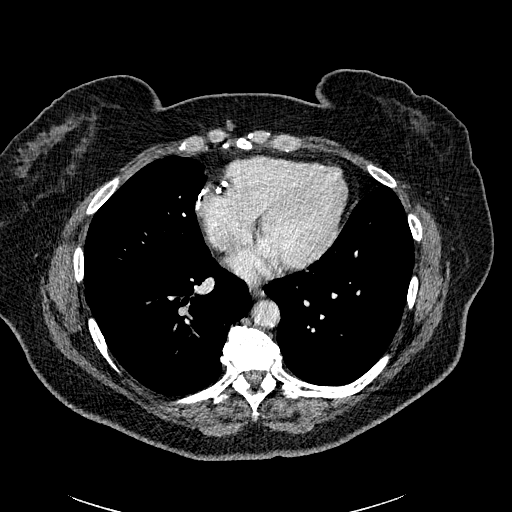
[im 55/143  lung]
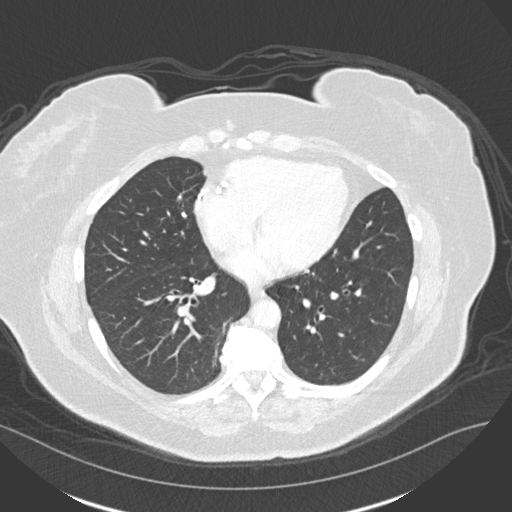
[im 66/143  lung]
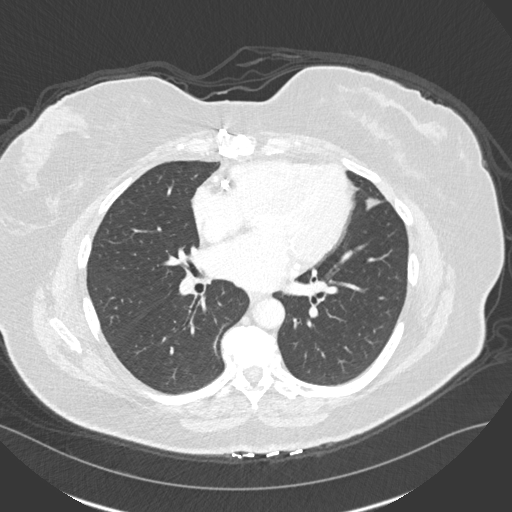
[im 77/143  lung]
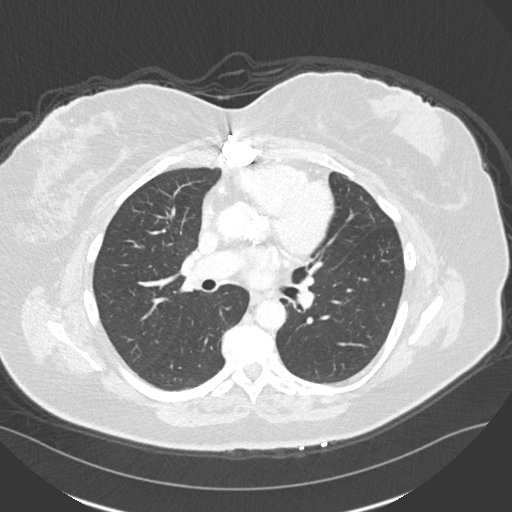
[im 88/143  lung]
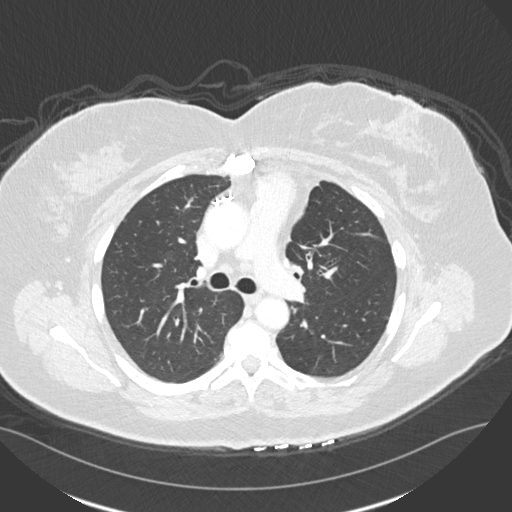
[im 99/143  mediastinal]
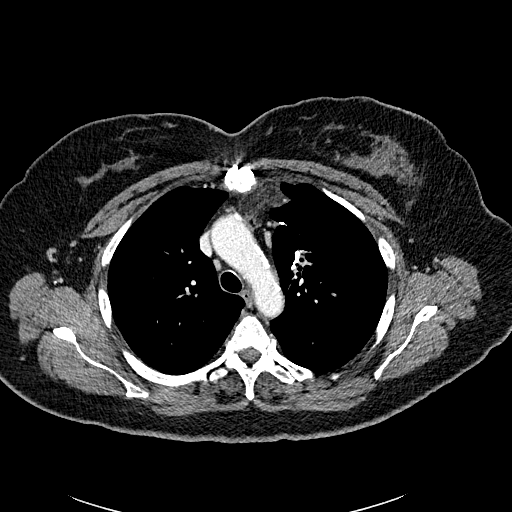
[im 99/143  lung]
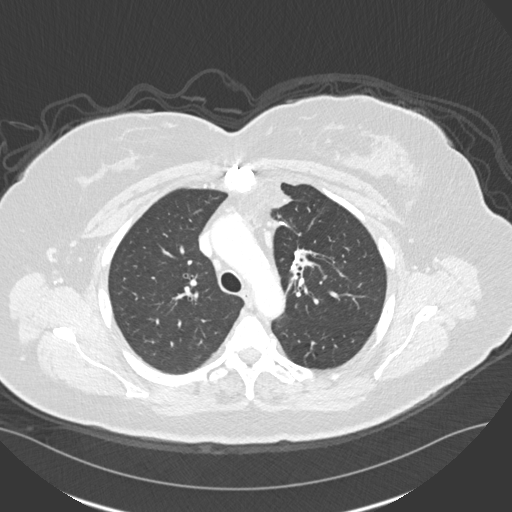
[im 110/143  lung]
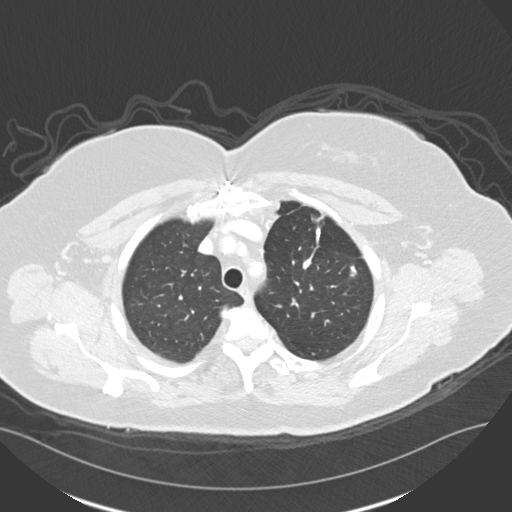
[im 121/143  lung]
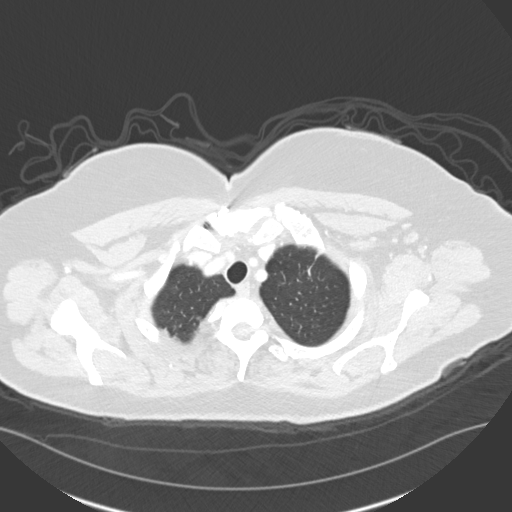
[im 132/143  lung]
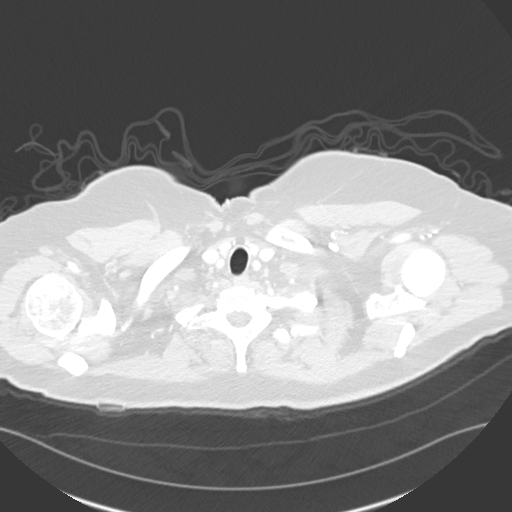

[Series 6: coronal · coronal · 0.49mm/px · 3 of 134 slices shown]
[im 27/134  lung]
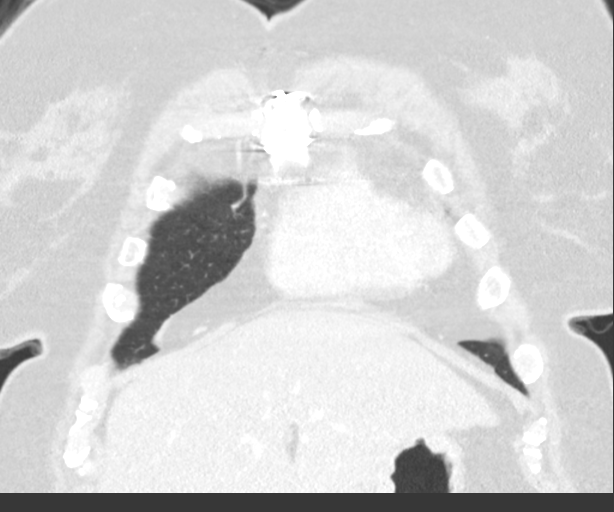
[im 54/134  lung]
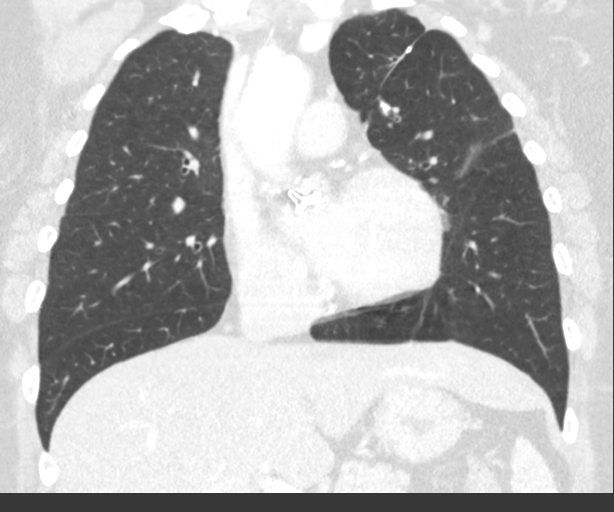
[im 80/134  lung]
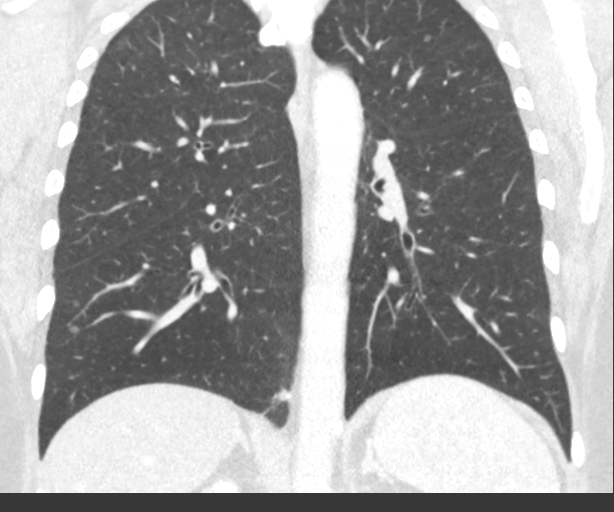

[15 of 36 positions shown; findings below may reference images not displayed]

FINDINGS: Cardiovascular: Previous median sternotomy and CABG procedure. No
pericardial effusion. Normal heart size.

Mediastinum/Nodes: No enlarged mediastinal, hilar, or axillary lymph
nodes. Thyroid gland, trachea, and esophagus demonstrate no
significant findings.

Lungs/Pleura: Postop change from left upper lobe lung resection. No
complicating features identified. Left upper lobe suture line is
stable. No suspicious features to suggest local tumor recurrence.
Left upper lobe non solid nodule is unchanged measuring 4 mm, image
32/5. Faint non solid nodule within the anterolateral right upper
lobe is unchanged measuring 4 mm, image 57/5. Subpleural nodule
within the anterior right middle lobe is also unchanged measuring 3
mm. No new suspicious lung nodules identified.

Upper Abdomen: No acute abnormality.

Musculoskeletal: No chest wall abnormality. No acute or significant
osseous findings. Multi level degenerative disc disease identified.
No suspicious bone lesions identified.
IMPRESSION: 1. Stable CT of the chest. No findings to suggest local tumor
recurrence or metastatic disease.
2. Small non-solid nodules within both lungs are unchanged from
previous exam.
3. Previous median sternotomy and CABG procedure.

## 2022-07-02 ENCOUNTER — Ambulatory Visit (HOSPITAL_COMMUNITY): Payer: PPO | Attending: Physician Assistant

## 2022-07-02 DIAGNOSIS — Z952 Presence of prosthetic heart valve: Secondary | ICD-10-CM

## 2022-07-02 DIAGNOSIS — I350 Nonrheumatic aortic (valve) stenosis: Secondary | ICD-10-CM | POA: Diagnosis not present

## 2022-07-02 LAB — ECHOCARDIOGRAM COMPLETE
AR max vel: 1 cm2
AV Area VTI: 1.07 cm2
AV Area mean vel: 1.13 cm2
AV Mean grad: 19 mmHg
AV Peak grad: 32.9 mmHg
Ao pk vel: 2.87 m/s
Area-P 1/2: 2.61 cm2
S' Lateral: 2.4 cm

## 2022-07-22 ENCOUNTER — Other Ambulatory Visit (HOSPITAL_BASED_OUTPATIENT_CLINIC_OR_DEPARTMENT_OTHER): Payer: Self-pay

## 2022-07-22 ENCOUNTER — Other Ambulatory Visit: Payer: Self-pay | Admitting: Interventional Cardiology

## 2022-07-22 ENCOUNTER — Other Ambulatory Visit: Payer: Self-pay | Admitting: Medical Oncology

## 2022-07-22 DIAGNOSIS — C3490 Malignant neoplasm of unspecified part of unspecified bronchus or lung: Secondary | ICD-10-CM

## 2022-07-22 MED ORDER — SERTRALINE HCL 50 MG PO TABS
50.0000 mg | ORAL_TABLET | Freq: Every day | ORAL | 0 refills | Status: DC
  Filled 2022-07-22: qty 90, 90d supply, fill #0

## 2022-07-22 MED ORDER — PRASUGREL HCL 10 MG PO TABS
10.0000 mg | ORAL_TABLET | Freq: Every day | ORAL | 3 refills | Status: DC
  Filled 2022-07-22: qty 90, 90d supply, fill #0
  Filled 2022-10-20: qty 90, 90d supply, fill #1
  Filled 2023-01-24: qty 90, 90d supply, fill #2
  Filled 2023-04-26: qty 90, 90d supply, fill #3

## 2022-07-23 ENCOUNTER — Other Ambulatory Visit (HOSPITAL_BASED_OUTPATIENT_CLINIC_OR_DEPARTMENT_OTHER): Payer: Self-pay

## 2022-07-23 ENCOUNTER — Ambulatory Visit
Admission: RE | Admit: 2022-07-23 | Discharge: 2022-07-23 | Disposition: A | Discharge status: Home / Self Care | Payer: PPO | Source: Ambulatory Visit | Attending: Family Medicine | Admitting: Family Medicine

## 2022-07-23 DIAGNOSIS — Z1231 Encounter for screening mammogram for malignant neoplasm of breast: Secondary | ICD-10-CM

## 2022-08-06 ENCOUNTER — Ambulatory Visit (HOSPITAL_COMMUNITY)
Admission: RE | Admit: 2022-08-06 | Discharge: 2022-08-06 | Disposition: A | Discharge status: Home / Self Care | Payer: PPO | Source: Ambulatory Visit | Attending: Internal Medicine | Admitting: Internal Medicine

## 2022-08-06 ENCOUNTER — Inpatient Hospital Stay: Payer: PPO | Attending: Internal Medicine

## 2022-08-06 ENCOUNTER — Other Ambulatory Visit: Payer: Self-pay

## 2022-08-06 DIAGNOSIS — C3490 Malignant neoplasm of unspecified part of unspecified bronchus or lung: Secondary | ICD-10-CM | POA: Insufficient documentation

## 2022-08-06 DIAGNOSIS — C3412 Malignant neoplasm of upper lobe, left bronchus or lung: Secondary | ICD-10-CM | POA: Diagnosis present

## 2022-08-06 DIAGNOSIS — E118 Type 2 diabetes mellitus with unspecified complications: Secondary | ICD-10-CM

## 2022-08-06 DIAGNOSIS — I700 Atherosclerosis of aorta: Secondary | ICD-10-CM | POA: Insufficient documentation

## 2022-08-06 LAB — CBC WITH DIFFERENTIAL/PLATELET
Abs Immature Granulocytes: 0.03 10*3/uL (ref 0.00–0.07)
Basophils Absolute: 0.1 10*3/uL (ref 0.0–0.1)
Basophils Relative: 1 %
Eosinophils Absolute: 0.2 10*3/uL (ref 0.0–0.5)
Eosinophils Relative: 2 %
HCT: 42.8 % (ref 36.0–46.0)
Hemoglobin: 14.2 g/dL (ref 12.0–15.0)
Immature Granulocytes: 0 %
Lymphocytes Relative: 28 %
Lymphs Abs: 3.3 10*3/uL (ref 0.7–4.0)
MCH: 30 pg (ref 26.0–34.0)
MCHC: 33.2 g/dL (ref 30.0–36.0)
MCV: 90.5 fL (ref 80.0–100.0)
Monocytes Absolute: 1 10*3/uL (ref 0.1–1.0)
Monocytes Relative: 8 %
Neutro Abs: 7.1 10*3/uL (ref 1.7–7.7)
Neutrophils Relative %: 61 %
Platelets: 227 10*3/uL (ref 150–400)
RBC: 4.73 MIL/uL (ref 3.87–5.11)
RDW: 13 % (ref 11.5–15.5)
WBC: 11.8 10*3/uL — ABNORMAL HIGH (ref 4.0–10.5)
nRBC: 0 % (ref 0.0–0.2)

## 2022-08-06 LAB — COMPREHENSIVE METABOLIC PANEL
ALT: 13 U/L (ref 0–44)
AST: 11 U/L — ABNORMAL LOW (ref 15–41)
Albumin: 4.3 g/dL (ref 3.5–5.0)
Alkaline Phosphatase: 85 U/L (ref 38–126)
Anion gap: 6 (ref 5–15)
BUN: 15 mg/dL (ref 8–23)
CO2: 30 mmol/L (ref 22–32)
Calcium: 9.6 mg/dL (ref 8.9–10.3)
Chloride: 103 mmol/L (ref 98–111)
Creatinine, Ser: 0.98 mg/dL (ref 0.44–1.00)
GFR, Estimated: >60 mL/min (ref >60)
Glucose, Bld: 155 mg/dL — ABNORMAL HIGH (ref 70–99)
Potassium: 4.2 mmol/L (ref 3.5–5.1)
Sodium: 139 mmol/L (ref 135–145)
Total Bilirubin: 0.7 mg/dL (ref 0.3–1.2)
Total Protein: 7.7 g/dL (ref 6.5–8.1)

## 2022-08-06 MED ORDER — SODIUM CHLORIDE (PF) 0.9 % IJ SOLN
INTRAMUSCULAR | Status: AC
  Filled 2022-08-06: qty 50

## 2022-08-06 MED ORDER — IOHEXOL 300 MG/ML  SOLN
75.0000 mL | Freq: Once | INTRAMUSCULAR | Status: AC | PRN
  Administered 2022-08-06: 75 mL via INTRAVENOUS

## 2022-08-11 ENCOUNTER — Inpatient Hospital Stay: Payer: PPO | Attending: Internal Medicine | Admitting: Internal Medicine

## 2022-08-11 VITALS — BP 149/72 | HR 72 | Temp 98.6°F | Resp 16 | Ht 62.0 in | Wt 194.0 lb

## 2022-08-11 DIAGNOSIS — E119 Type 2 diabetes mellitus without complications: Secondary | ICD-10-CM | POA: Diagnosis not present

## 2022-08-11 DIAGNOSIS — C3490 Malignant neoplasm of unspecified part of unspecified bronchus or lung: Secondary | ICD-10-CM

## 2022-08-11 DIAGNOSIS — I700 Atherosclerosis of aorta: Secondary | ICD-10-CM | POA: Insufficient documentation

## 2022-08-11 DIAGNOSIS — C3412 Malignant neoplasm of upper lobe, left bronchus or lung: Secondary | ICD-10-CM | POA: Diagnosis present

## 2022-08-11 NOTE — Progress Notes (Signed)
[place] [phone]   [phone]  OFFICE PROGRESS NOTE  Christain Sacramento, MD [address] [place] [address]  DIAGNOSIS: Stage IA (T1 a, Nx, Mx) non-small cell lung cancer, adenocarcinoma measuring 1.2 cm.  PRIOR THERAPY: Status post wedge resection of the left upper lobe on May 24, 2018 under the care of Dr. Servando Snare.  CURRENT THERAPY: Observation.  INTERVAL HISTORY: Sarah Stevenson 66 y.o. female returns to the clinic today for annual follow-up visit.  The patient is feeling fine with no concerning complaints.  She had a sinus surgery as well as hip surgery in the last few months.  She denied having any current chest pain, shortness of breath, cough or hemoptysis.  She has no nausea, vomiting, diarrhea or constipation.  She has no headache or visual changes.  She is here today for evaluation with repeat CT scan of the chest for restaging of her disease.  MEDICAL HISTORY: Past Medical History:  Diagnosis Date   Anginal pain ([place])    Aortic stenosis    Coronary artery disease    Depression    "mild" (08/06/2016)   Dyspnea    Hyperlipidemia    lung ca dx'd 05/2018   Murmur, cardiac    Myocardial infarction [facility]) 2016   Osteoarthritis    "left hip and knee" (08/06/2016)   Type II diabetes mellitus (H[facility])     ALLERGIES:  is allergic to brilinta [ticagrelor], lisinopril, asa [aspirin], blue dyes (parenteral), corn-containing products, nsaids, tea, rosuvastatin, zetia [ezetimibe], isosulfan blue, and other.  MEDICATIONS:  Current Outpatient Medications  Medication Sig Dispense Refill   Acetaminophen (TYLENOL DISSOLVE PACKS) 500 MG PACK Take 1,000 mg by mouth every 6 (six) hours as needed (pain).     acetaminophen (TYLENOL) 325 MG tablet Take 650 mg by mouth every 6 (six) hours as needed for moderate pain.     amoxicillin (AMOXIL) 500 MG capsule Take 4 capsules by mouth 1 hour before appointment 16 capsule 0   carvedilol  (COREG) 6.25 MG tablet Take 1 tablet (6.25 mg total) by mouth 2 (two) times daily with a meal. 180 tablet 3   celecoxib (CELEBREX) 200 MG capsule Take one tablet by mouth daily as needed for leg pain 90 capsule 3   cetirizine (ZYRTEC) 10 MG tablet Take 10 mg by mouth daily.     COLLAGEN PO Take 1 Scoop by mouth daily. With biotin and vitamin c     Dulaglutide (TRULICITY) 1.5 XA/1.2IN SOPN Inject 0.5 mLs (1.5 mg total) into the skin once a week. For diabetes 6 mL 3   Evolocumab (REPATHA SURECLICK) 867 MG/ML SOAJ INJECT 140 MG INTO THE SKIN EVERY 14 DAYS 6 mL 3   famotidine (PEPCID) 20 MG tablet Take 20 mg by mouth at bedtime.     fluticasone (FLONASE) 50 MCG/ACT nasal spray Place 1 spray into both nostrils daily.     glipiZIDE (GLUCOTROL XL) 10 MG 24 hr tablet TAKE 1 TABLET BY MOUTH DAILY FOR DIABETES CONTROL. 90 tablet 3   glucose blood (ONETOUCH ULTRA) test strip TEST BLOOD SUGAR ONCE A DAY     losartan (COZAAR) 50 MG tablet TAKE 1 TABLET BY MOUTH DAILY TO PRESERVE KIDNEY FUNCTION. 90 tablet 3   Menthol-Methyl Salicylate (MUSCLE RUB EX) Apply 1 application topically as needed (for pain).     metFORMIN (GLUCOPHAGE) 850 MG tablet Take 1 tablet (850 mg total) by mouth 2 (two) times daily to control  diabetes. 180 tablet 3   methocarbamol (ROBAXIN) 500 MG tablet Take one tablet 4 times a day as needed for muscle 40 tablet 2   montelukast (SINGULAIR) 10 MG tablet Take 1 tablet by mouth daily for allergy control 30 tablet 5   prasugrel (EFFIENT) 10 MG TABS tablet Take 1 tablet (10 mg total) by mouth daily. 90 tablet 3   Propylene Glycol (SYSTANE COMPLETE) 0.6 % SOLN Place 2 drops into both eyes daily.     sertraline (ZOLOFT) 50 MG tablet Take 1 tablet (50 mg total) by mouth daily to control anxiety 90 tablet 0   No current facility-administered medications for this visit.    SURGICAL HISTORY:  Past Surgical History:  Procedure Laterality Date   AORTIC VALVE REPLACEMENT N/A 05/17/2018   Procedure:  AORTIC VALVE REPLACEMENT (AVR) USING 21 MM MAGNA EASE PERICARDIAL BIOPROSTHESIS. MODEL # [id number]. SERIAL # [id number];  Surgeon: Grace Isaac, MD;  Location: [place];  Service: Open Heart Surgery;  Laterality: N/A;   CARDIAC CATHETERIZATION Right 11/30/2014   Procedure: RIGHT/LEFT HEART CATH AND CORONARY ANGIOGRAPHY;  Surgeon: Belva Crome, MD;  Location: [facility] CATH LAB;  Service: Cardiovascular;  Laterality: Right;   CARDIAC CATHETERIZATION N/A 12/13/2015   Procedure: Left Heart Cath and Coronary Angiography;  Surgeon: Leonie Man, MD;  Location: [place] CV LAB;  Service: Cardiovascular;  Laterality: N/A;   CARDIAC CATHETERIZATION N/A 12/13/2015   Procedure: Intravascular Pressure Wire/FFR Study;  Surgeon: Leonie Man, MD;  Location: [place] CV LAB;  Service: Cardiovascular;  Laterality: N/A;  RCA   CARDIAC CATHETERIZATION N/A 08/06/2016   Procedure: Right/Left Heart Cath and Coronary Angiography;  Surgeon: Lorretta Harp, MD;  Location: [place] CV LAB;  Service: Cardiovascular;  Laterality: N/A;   CARDIAC CATHETERIZATION N/A 08/06/2016   Procedure: Coronary Stent Intervention;  Surgeon: Lorretta Harp, MD;  Location: [place] CV LAB;  Service: Cardiovascular;  Laterality: N/A;   distal rca 3.0x16 and 3.0x8 synergy   [place] WITH STENT PLACEMENT  08/06/2016   "2 stents"   CORONARY ARTERY BYPASS GRAFT N/A 05/17/2018   Procedure: CORONARY ARTERY BYPASS GRAFTING (CABG) X 2 WITH ENDOSCOPIC HARVESTING OF RIGHT SAPHENOUS VEIN. LIMA TO LAD. SVG TO RCA;  Surgeon: Grace Isaac, MD;  Location: [place];  Service: Open Heart Surgery;  Laterality: N/A;   ETHMOIDECTOMY Right 07/28/2021   Procedure: ETHMOIDECTOMY;  Surgeon: Leta Baptist, MD;  Location: [place];  Service: ENT;  Laterality: Right;   FRONTAL SINUS EXPLORATION Right 07/28/2021   Procedure: FRONTAL SINUS EXPLORATION;  Surgeon: Leta Baptist, MD;  Location: [place];  Service: ENT;  Laterality: Right;   HYSTEROSCOPY DIAGNOSTIC  1990s   JOINT REPLACEMENT     KNEE ARTHROSCOPY Left 1996   MAXILLARY ANTROSTOMY Right 07/28/2021   Procedure: MAXILLARY ANTROSTOMY WITH TISSUE REMOVAL;  Surgeon: Leta Baptist, MD;  Location: [place];  Service: ENT;  Laterality: Right;   [place] Right 07/28/2021   Procedure: ENDOSCOPIC SINUS SURGERY WITH STEALTH NAVIGATION;  Surgeon: Leta Baptist, MD;  Location: [place];  Service: ENT;  Laterality: Right;   RIGHT/LEFT HEART CATH AND CORONARY ANGIOGRAPHY N/A 04/01/2018   Procedure: RIGHT/LEFT HEART CATH AND CORONARY ANGIOGRAPHY;  Surgeon: Belva Crome, MD;  Location: [place] CV LAB;  Service: Cardiovascular;  Laterality: N/A;   TEE WITHOUT CARDIOVERSION N/A 05/17/2018  Procedure: TRANSESOPHAGEAL ECHOCARDIOGRAM (TEE);  Surgeon: Grace Isaac, MD;  Location: [place];  Service: Open Heart Surgery;  Laterality: N/A;   TOTAL HIP ARTHROPLASTY Left 04/23/2017   Procedure: LEFT TOTAL HIP ARTHROPLASTY ANTERIOR APPROACH;  Surgeon: Mcarthur Rossetti, MD;  Location: [facility];  Service: Orthopedics;  Laterality: Left;   TOTAL HIP ARTHROPLASTY Right 10/17/2021   Procedure: TOTAL HIP ARTHROPLASTY ANTERIOR APPROACH;  Surgeon: Mcarthur Rossetti, MD;  Location: [facility];  Service: Orthopedics;  Laterality: Right;   WEDGE RESECTION Left 05/17/2018   Procedure: LEFT UPPER LOBE LUNG WEDGE RESECTION;  Surgeon: Grace Isaac, MD;  Location: [place];  Service: Open Heart Surgery;  Laterality: Left;    REVIEW OF SYSTEMS:  A comprehensive review of systems was negative.   PHYSICAL EXAMINATION: General appearance: alert, cooperative, and no distress Head: Normocephalic, without obvious abnormality, atraumatic Neck: no adenopathy, no JVD, supple, symmetrical, trachea midline, and thyroid not enlarged, symmetric, no tenderness/mass/nodules Lymph nodes:  Cervical, supraclavicular, and axillary nodes normal. Resp: clear to auscultation bilaterally Back: symmetric, no curvature. ROM normal. No CVA tenderness. Cardio: regular rate and rhythm, S1, S2 normal, no murmur, click, rub or gallop GI: soft, non-tender; bowel sounds normal; no masses,  no organomegaly Extremities: extremities normal, atraumatic, no cyanosis or edema  ECOG PERFORMANCE STATUS: 1 - Symptomatic but completely ambulatory  Blood pressure (!) 149/72, pulse 72, temperature 98.6 F (37 C), temperature source Oral, resp. rate 16, height 5\' 2"  (1.575 m), weight 194 lb (88 kg), SpO2 98 %.  LABORATORY DATA: Lab Results  Component Value Date   WBC 11.8 (H) 08/06/2022   HGB 14.2 08/06/2022   HCT 42.8 08/06/2022   MCV 90.5 08/06/2022   PLT 227 08/06/2022      Chemistry      Component Value Date/Time   NA 139 08/06/2022 0927   NA 136 10/17/2019 0914   K 4.2 08/06/2022 0927   CL 103 08/06/2022 0927   CO2 30 08/06/2022 0927   BUN 15 08/06/2022 0927   BUN 19 10/17/2019 0914   CREATININE 0.98 08/06/2022 0927   CREATININE 0.86 07/14/2021 1458      Component Value Date/Time   CALCIUM 9.6 08/06/2022 0927   ALKPHOS 85 08/06/2022 0927   AST 11 (L) 08/06/2022 0927   AST 11 (L) 07/14/2021 1458   ALT 13 08/06/2022 0927   ALT 12 07/14/2021 1458   BILITOT 0.7 08/06/2022 0927   BILITOT 0.4 07/14/2021 1458       RADIOGRAPHIC STUDIES: CT Chest W Contrast  Result Date: 08/06/2022 CLINICAL DATA:  A 66 year old female presents for evaluation of non-small cell lung cancer. Staging assessment. * Tracking Code: BO * EXAM: CT CHEST WITH CONTRAST TECHNIQUE: Multidetector CT imaging of the chest was performed during intravenous contrast administration. RADIATION DOSE REDUCTION: This exam was performed according to the departmental dose-optimization program which includes automated exposure control, adjustment of the mA and/or kV according to patient size and/or use of iterative  reconstruction technique. CONTRAST:  55mL OMNIPAQUE IOHEXOL 300 MG/ML  SOLN COMPARISON:  July 14, 2021 FINDINGS: Cardiovascular: Calcified aortic atherosclerotic changes. Signs of aortic valve replacement similar to prior imaging. Signs of calcified coronary artery disease. No pericardial effusion or nodularity. Mediastinum/Nodes: Scattered small lymph nodes throughout the mediastinum display no change compared to previous imaging. Esophagus grossly normal. No signs of hilar adenopathy. No axillary or thoracic inlet lymphadenopathy. Lungs/Pleura: Juxtapleural nodule in the RIGHT middle lobe (image 86/7) 5 mm, unchanged compared to imaging from November  of 2022. No new or suspicious pulmonary nodules. No sign of pleural effusion. No sign of consolidative changes. Airways are patent. Parenchymal distortion in the LEFT upper lobe due to postoperative change is unchanged. Upper Abdomen: No acute findings related to visualized liver, gallbladder, pancreas, spleen, adrenal glands or kidneys. No acute gastrointestinal process to the extent evaluated. Suspect at least moderate hepatic steatosis. Musculoskeletal: No acute bone finding. No destructive bone process. Spinal degenerative changes. IMPRESSION: 1. Stable appearance of the chest with postoperative changes in the LEFT upper lobe. No new or progressive findings. 2. Signs of calcified coronary artery disease. 3. Query at least moderate hepatic steatosis. 4. Aortic atherosclerosis. Aortic Atherosclerosis (ICD10-I70.0). Electronically Signed   By: Zetta Bills M.D.   On: 08/06/2022 17:23   MM 3D SCREEN BREAST BILATERAL  Result Date: 07/27/2022 CLINICAL DATA:  Screening. EXAM: DIGITAL SCREENING BILATERAL MAMMOGRAM WITH TOMOSYNTHESIS AND CAD TECHNIQUE: Bilateral screening digital craniocaudal and mediolateral oblique mammograms were obtained. Bilateral screening digital breast tomosynthesis was performed. The images were evaluated with computer-aided detection.  COMPARISON:  Previous exam(s). ACR Breast Density Category c: The breast tissue is heterogeneously dense, which may obscure small masses. FINDINGS: There are no findings suspicious for malignancy. IMPRESSION: No mammographic evidence of malignancy. A result letter of this screening mammogram will be mailed directly to the patient. RECOMMENDATION: Screening mammogram in one year. (Code:SM-B-01Y) BI-RADS CATEGORY  1: Negative. Electronically Signed   By: Margarette Canada M.D.   On: 07/27/2022 12:00     ASSESSMENT AND PLAN: This is a very pleasant 66 years old white female diagnosed with a stage Ia non-small cell lung cancer status post wedge resection of the left upper lobe in September 2019 under the care of Dr. Servando Snare. The patient has been on observation since that time and she is feeling fine with no concerning complaints. She had repeat CT scan of the chest performed recently.  I personally and independently reviewed the scan and discussed the result with the patient today. Her scan showed no concerning findings for disease recurrence or metastasis. I recommended for her to continue on observation with repeat CT scan of the chest in 1 year. The patient was advised to call immediately if she has any other concerning symptoms in the interval. The patient voices understanding of current disease status and treatment options and is in agreement with the current care plan. All questions were answered. The patient knows to call the clinic with any problems, questions or concerns. We can certainly see the patient much sooner if necessary.   Disclaimer: This note was dictated with voice recognition software. Similar sounding words can inadvertently be transcribed and may not be corrected upon review.

## 2022-08-20 ENCOUNTER — Other Ambulatory Visit (HOSPITAL_BASED_OUTPATIENT_CLINIC_OR_DEPARTMENT_OTHER): Payer: Self-pay

## 2022-08-21 ENCOUNTER — Other Ambulatory Visit: Payer: Self-pay

## 2022-09-10 ENCOUNTER — Other Ambulatory Visit (HOSPITAL_BASED_OUTPATIENT_CLINIC_OR_DEPARTMENT_OTHER): Payer: Self-pay

## 2022-09-10 MED ORDER — MONTELUKAST SODIUM 10 MG PO TABS
10.0000 mg | ORAL_TABLET | Freq: Every day | ORAL | 3 refills | Status: DC
  Filled 2022-09-10 – 2022-10-02 (×3): qty 90, 90d supply, fill #0
  Filled 2022-12-30: qty 90, 90d supply, fill #1

## 2022-09-10 MED ORDER — SERTRALINE HCL 50 MG PO TABS
50.0000 mg | ORAL_TABLET | Freq: Every day | ORAL | 3 refills | Status: DC
  Filled 2022-09-10 – 2022-09-28 (×2): qty 90, 90d supply, fill #0
  Filled 2023-01-24: qty 90, 90d supply, fill #1
  Filled 2023-04-26: qty 90, 90d supply, fill #2
  Filled 2023-07-09: qty 90, 90d supply, fill #3

## 2022-09-14 ENCOUNTER — Other Ambulatory Visit (HOSPITAL_BASED_OUTPATIENT_CLINIC_OR_DEPARTMENT_OTHER): Payer: Self-pay

## 2022-09-14 ENCOUNTER — Other Ambulatory Visit: Payer: Self-pay

## 2022-09-16 ENCOUNTER — Other Ambulatory Visit (HOSPITAL_BASED_OUTPATIENT_CLINIC_OR_DEPARTMENT_OTHER): Payer: Self-pay

## 2022-09-23 ENCOUNTER — Other Ambulatory Visit (HOSPITAL_BASED_OUTPATIENT_CLINIC_OR_DEPARTMENT_OTHER): Payer: Self-pay

## 2022-09-28 ENCOUNTER — Other Ambulatory Visit (HOSPITAL_BASED_OUTPATIENT_CLINIC_OR_DEPARTMENT_OTHER): Payer: Self-pay

## 2022-09-29 ENCOUNTER — Other Ambulatory Visit (HOSPITAL_COMMUNITY): Payer: Self-pay

## 2022-10-02 ENCOUNTER — Other Ambulatory Visit (HOSPITAL_BASED_OUTPATIENT_CLINIC_OR_DEPARTMENT_OTHER): Payer: Self-pay

## 2022-10-20 ENCOUNTER — Other Ambulatory Visit (HOSPITAL_BASED_OUTPATIENT_CLINIC_OR_DEPARTMENT_OTHER): Payer: Self-pay

## 2022-10-20 ENCOUNTER — Other Ambulatory Visit: Payer: Self-pay

## 2022-10-21 ENCOUNTER — Other Ambulatory Visit (HOSPITAL_BASED_OUTPATIENT_CLINIC_OR_DEPARTMENT_OTHER): Payer: Self-pay

## 2022-11-12 ENCOUNTER — Encounter: Payer: Self-pay | Admitting: Radiology

## 2022-11-23 ENCOUNTER — Other Ambulatory Visit: Payer: Self-pay

## 2022-11-23 ENCOUNTER — Other Ambulatory Visit (HOSPITAL_BASED_OUTPATIENT_CLINIC_OR_DEPARTMENT_OTHER): Payer: Self-pay

## 2022-12-30 ENCOUNTER — Other Ambulatory Visit: Payer: Self-pay

## 2022-12-30 ENCOUNTER — Other Ambulatory Visit (HOSPITAL_BASED_OUTPATIENT_CLINIC_OR_DEPARTMENT_OTHER): Payer: Self-pay

## 2023-01-04 ENCOUNTER — Encounter: Payer: Self-pay | Admitting: Physician Assistant

## 2023-01-04 ENCOUNTER — Ambulatory Visit (INDEPENDENT_AMBULATORY_CARE_PROVIDER_SITE_OTHER): Payer: PPO | Admitting: Physician Assistant

## 2023-01-04 ENCOUNTER — Other Ambulatory Visit (INDEPENDENT_AMBULATORY_CARE_PROVIDER_SITE_OTHER): Payer: PPO

## 2023-01-04 DIAGNOSIS — Z96641 Presence of right artificial hip joint: Secondary | ICD-10-CM

## 2023-01-04 NOTE — Progress Notes (Signed)
HPI: Sarah Stevenson returns today 1 year status post right total hip arthroplasty.  She states overall right hip is doing well.  She has no complaints.  She does have some occasional knee pain after doing a lot of yard work.  Review of systems: See HPI otherwise negative or noncontributory.  Physical exam: General well-developed well-nourished female no acute distress ambulates without any assistive device and a nonantalgic gait.  Bilateral hips: Good range of motion of both hips without pain.  Radiographic: AP pelvis lateral view right hip: Status post bilateral total hip arthroplasty well-seated components.  No acute fractures.  Hips well located.  Impression: Status post right total hip arthroplasty 10/17/2021  Plan: She will follow-up with [place] on as-needed basis.  She can always see [place] for her knees if they become problematic.  Questions were encouraged and answered

## 2023-01-25 ENCOUNTER — Other Ambulatory Visit: Payer: Self-pay

## 2023-01-25 ENCOUNTER — Other Ambulatory Visit (HOSPITAL_BASED_OUTPATIENT_CLINIC_OR_DEPARTMENT_OTHER): Payer: Self-pay

## 2023-01-26 ENCOUNTER — Other Ambulatory Visit (HOSPITAL_BASED_OUTPATIENT_CLINIC_OR_DEPARTMENT_OTHER): Payer: Self-pay

## 2023-01-27 ENCOUNTER — Telehealth: Payer: Self-pay | Admitting: Internal Medicine

## 2023-01-27 NOTE — Telephone Encounter (Signed)
Called patient regarding upcoming December appointment, patient is notified. 

## 2023-03-08 ENCOUNTER — Other Ambulatory Visit (HOSPITAL_BASED_OUTPATIENT_CLINIC_OR_DEPARTMENT_OTHER): Payer: Self-pay

## 2023-03-08 MED ORDER — METFORMIN HCL 850 MG PO TABS
850.0000 mg | ORAL_TABLET | Freq: Two times a day (BID) | ORAL | 3 refills | Status: DC
  Filled 2023-03-08: qty 180, 90d supply, fill #0
  Filled 2023-06-15: qty 180, 90d supply, fill #1
  Filled 2023-09-09: qty 180, 90d supply, fill #2
  Filled 2023-12-16: qty 180, 90d supply, fill #3

## 2023-03-08 MED ORDER — TRULICITY 1.5 MG/0.5ML ~~LOC~~ SOAJ
1.5000 mg | SUBCUTANEOUS | 3 refills | Status: DC
  Filled 2023-03-08: qty 2, 28d supply, fill #0
  Filled 2023-04-26: qty 2, 28d supply, fill #1
  Filled 2023-06-15: qty 2, 28d supply, fill #2
  Filled 2023-08-01: qty 2, 28d supply, fill #3
  Filled 2023-08-26: qty 2, 28d supply, fill #4
  Filled 2023-09-28: qty 2, 28d supply, fill #5
  Filled 2023-10-23: qty 2, 28d supply, fill #6
  Filled 2023-11-25: qty 2, 28d supply, fill #7
  Filled 2023-12-24: qty 2, 28d supply, fill #8
  Filled 2024-01-21: qty 2, 28d supply, fill #9
  Filled 2024-02-22: qty 2, 28d supply, fill #10

## 2023-03-08 MED ORDER — CELECOXIB 200 MG PO CAPS
200.0000 mg | ORAL_CAPSULE | Freq: Every day | ORAL | 0 refills | Status: DC | PRN
  Filled 2023-03-08: qty 30, 30d supply, fill #0

## 2023-03-30 ENCOUNTER — Other Ambulatory Visit (HOSPITAL_BASED_OUTPATIENT_CLINIC_OR_DEPARTMENT_OTHER): Payer: Self-pay

## 2023-03-30 MED ORDER — GLIPIZIDE ER 5 MG PO TB24
5.0000 mg | ORAL_TABLET | Freq: Every day | ORAL | 3 refills | Status: DC
  Filled 2023-03-30: qty 90, 90d supply, fill #0
  Filled 2023-07-09: qty 90, 90d supply, fill #1
  Filled 2023-10-09: qty 90, 90d supply, fill #2
  Filled 2024-01-21: qty 90, 90d supply, fill #3

## 2023-03-30 MED ORDER — MONTELUKAST SODIUM 10 MG PO TABS
10.0000 mg | ORAL_TABLET | Freq: Every day | ORAL | 3 refills | Status: DC
  Filled 2023-03-30: qty 90, 90d supply, fill #0

## 2023-03-30 MED ORDER — CELECOXIB 200 MG PO CAPS
200.0000 mg | ORAL_CAPSULE | Freq: Every day | ORAL | 3 refills | Status: DC | PRN
  Filled 2023-03-30 – 2023-04-02 (×2): qty 90, 90d supply, fill #0

## 2023-03-30 MED ORDER — LOSARTAN POTASSIUM 50 MG PO TABS
50.0000 mg | ORAL_TABLET | Freq: Every day | ORAL | 3 refills | Status: DC
  Filled 2023-03-30 – 2023-04-02 (×2): qty 90, 90d supply, fill #0

## 2023-04-02 ENCOUNTER — Other Ambulatory Visit (HOSPITAL_BASED_OUTPATIENT_CLINIC_OR_DEPARTMENT_OTHER): Payer: Self-pay

## 2023-04-14 ENCOUNTER — Emergency Department (HOSPITAL_COMMUNITY): Payer: PPO

## 2023-04-14 ENCOUNTER — Encounter (HOSPITAL_COMMUNITY): Payer: Self-pay

## 2023-04-14 ENCOUNTER — Inpatient Hospital Stay (HOSPITAL_BASED_OUTPATIENT_CLINIC_OR_DEPARTMENT_OTHER)
Admission: EM | Admit: 2023-04-14 | Discharge: 2023-04-17 | DRG: 200 | Disposition: A | Discharge status: Home / Self Care | Payer: PPO | Attending: Internal Medicine | Admitting: Internal Medicine

## 2023-04-14 ENCOUNTER — Emergency Department (HOSPITAL_BASED_OUTPATIENT_CLINIC_OR_DEPARTMENT_OTHER): Payer: PPO

## 2023-04-14 ENCOUNTER — Other Ambulatory Visit: Payer: Self-pay

## 2023-04-14 DIAGNOSIS — Z96643 Presence of artificial hip joint, bilateral: Secondary | ICD-10-CM | POA: Diagnosis not present

## 2023-04-14 DIAGNOSIS — S2242XA Multiple fractures of ribs, left side, initial encounter for closed fracture: Secondary | ICD-10-CM | POA: Diagnosis present

## 2023-04-14 DIAGNOSIS — Z91048 Other nonmedicinal substance allergy status: Secondary | ICD-10-CM

## 2023-04-14 DIAGNOSIS — S270XXA Traumatic pneumothorax, initial encounter: Principal | ICD-10-CM | POA: Diagnosis present

## 2023-04-14 DIAGNOSIS — J982 Interstitial emphysema: Secondary | ICD-10-CM

## 2023-04-14 DIAGNOSIS — I110 Hypertensive heart disease with heart failure: Secondary | ICD-10-CM | POA: Diagnosis not present

## 2023-04-14 DIAGNOSIS — Z6833 Body mass index (BMI) 33.0-33.9, adult: Secondary | ICD-10-CM | POA: Diagnosis not present

## 2023-04-14 DIAGNOSIS — Z8349 Family history of other endocrine, nutritional and metabolic diseases: Secondary | ICD-10-CM

## 2023-04-14 DIAGNOSIS — J90 Pleural effusion, not elsewhere classified: Secondary | ICD-10-CM | POA: Diagnosis present

## 2023-04-14 DIAGNOSIS — Z888 Allergy status to other drugs, medicaments and biological substances status: Secondary | ICD-10-CM | POA: Diagnosis not present

## 2023-04-14 DIAGNOSIS — Z91018 Allergy to other foods: Secondary | ICD-10-CM | POA: Diagnosis not present

## 2023-04-14 DIAGNOSIS — Z7984 Long term (current) use of oral hypoglycemic drugs: Secondary | ICD-10-CM

## 2023-04-14 DIAGNOSIS — Z886 Allergy status to analgesic agent status: Secondary | ICD-10-CM

## 2023-04-14 DIAGNOSIS — E871 Hypo-osmolality and hyponatremia: Secondary | ICD-10-CM | POA: Diagnosis present

## 2023-04-14 DIAGNOSIS — Y92008 Other place in unspecified non-institutional (private) residence as the place of occurrence of the external cause: Secondary | ICD-10-CM | POA: Diagnosis not present

## 2023-04-14 DIAGNOSIS — T797XXA Traumatic subcutaneous emphysema, initial encounter: Secondary | ICD-10-CM | POA: Diagnosis present

## 2023-04-14 DIAGNOSIS — Z794 Long term (current) use of insulin: Secondary | ICD-10-CM

## 2023-04-14 DIAGNOSIS — W19XXXA Unspecified fall, initial encounter: Secondary | ICD-10-CM | POA: Diagnosis not present

## 2023-04-14 DIAGNOSIS — J939 Pneumothorax, unspecified: Secondary | ICD-10-CM

## 2023-04-14 DIAGNOSIS — S0003XA Contusion of scalp, initial encounter: Secondary | ICD-10-CM | POA: Diagnosis present

## 2023-04-14 DIAGNOSIS — J9811 Atelectasis: Secondary | ICD-10-CM | POA: Diagnosis not present

## 2023-04-14 DIAGNOSIS — I252 Old myocardial infarction: Secondary | ICD-10-CM

## 2023-04-14 DIAGNOSIS — Z85118 Personal history of other malignant neoplasm of bronchus and lung: Secondary | ICD-10-CM

## 2023-04-14 DIAGNOSIS — E669 Obesity, unspecified: Secondary | ICD-10-CM | POA: Diagnosis not present

## 2023-04-14 DIAGNOSIS — I5032 Chronic diastolic (congestive) heart failure: Secondary | ICD-10-CM | POA: Diagnosis present

## 2023-04-14 DIAGNOSIS — Z7902 Long term (current) use of antithrombotics/antiplatelets: Secondary | ICD-10-CM

## 2023-04-14 DIAGNOSIS — S27321A Contusion of lung, unilateral, initial encounter: Secondary | ICD-10-CM

## 2023-04-14 DIAGNOSIS — Z8249 Family history of ischemic heart disease and other diseases of the circulatory system: Secondary | ICD-10-CM

## 2023-04-14 DIAGNOSIS — I350 Nonrheumatic aortic (valve) stenosis: Secondary | ICD-10-CM | POA: Diagnosis present

## 2023-04-14 DIAGNOSIS — Y92009 Unspecified place in unspecified non-institutional (private) residence as the place of occurrence of the external cause: Secondary | ICD-10-CM

## 2023-04-14 DIAGNOSIS — W108XXA Fall (on) (from) other stairs and steps, initial encounter: Secondary | ICD-10-CM | POA: Diagnosis not present

## 2023-04-14 DIAGNOSIS — S2249XA Multiple fractures of ribs, unspecified side, initial encounter for closed fracture: Secondary | ICD-10-CM | POA: Diagnosis not present

## 2023-04-14 DIAGNOSIS — E785 Hyperlipidemia, unspecified: Secondary | ICD-10-CM | POA: Diagnosis present

## 2023-04-14 DIAGNOSIS — Z953 Presence of xenogenic heart valve: Secondary | ICD-10-CM | POA: Diagnosis not present

## 2023-04-14 DIAGNOSIS — Z951 Presence of aortocoronary bypass graft: Secondary | ICD-10-CM

## 2023-04-14 DIAGNOSIS — I2510 Atherosclerotic heart disease of native coronary artery without angina pectoris: Secondary | ICD-10-CM | POA: Diagnosis present

## 2023-04-14 DIAGNOSIS — Z955 Presence of coronary angioplasty implant and graft: Secondary | ICD-10-CM

## 2023-04-14 DIAGNOSIS — E119 Type 2 diabetes mellitus without complications: Secondary | ICD-10-CM

## 2023-04-14 DIAGNOSIS — Z9102 Food additives allergy status: Secondary | ICD-10-CM

## 2023-04-14 DIAGNOSIS — F32A Depression, unspecified: Secondary | ICD-10-CM | POA: Diagnosis not present

## 2023-04-14 DIAGNOSIS — F17210 Nicotine dependence, cigarettes, uncomplicated: Secondary | ICD-10-CM | POA: Diagnosis present

## 2023-04-14 DIAGNOSIS — S7002XA Contusion of left hip, initial encounter: Secondary | ICD-10-CM | POA: Diagnosis present

## 2023-04-14 DIAGNOSIS — E118 Type 2 diabetes mellitus with unspecified complications: Principal | ICD-10-CM

## 2023-04-14 DIAGNOSIS — Z7985 Long-term (current) use of injectable non-insulin antidiabetic drugs: Secondary | ICD-10-CM

## 2023-04-14 DIAGNOSIS — Z803 Family history of malignant neoplasm of breast: Secondary | ICD-10-CM

## 2023-04-14 DIAGNOSIS — Z79899 Other long term (current) drug therapy: Secondary | ICD-10-CM

## 2023-04-14 DIAGNOSIS — N179 Acute kidney failure, unspecified: Secondary | ICD-10-CM

## 2023-04-14 LAB — CBC WITH DIFFERENTIAL/PLATELET
Abs Immature Granulocytes: 0.05 10*3/uL (ref 0.00–0.07)
Basophils Absolute: 0 10*3/uL (ref 0.0–0.1)
Basophils Relative: 0 %
Eosinophils Absolute: 0 10*3/uL (ref 0.0–0.5)
Eosinophils Relative: 0 %
HCT: 37.7 % (ref 36.0–46.0)
Hemoglobin: 12.8 g/dL (ref 12.0–15.0)
Immature Granulocytes: 0 %
Lymphocytes Relative: 13 %
Lymphs Abs: 1.7 10*3/uL (ref 0.7–4.0)
MCH: 31 pg (ref 26.0–34.0)
MCHC: 34 g/dL (ref 30.0–36.0)
MCV: 91.3 fL (ref 80.0–100.0)
Monocytes Absolute: 1.4 10*3/uL — ABNORMAL HIGH (ref 0.1–1.0)
Monocytes Relative: 11 %
Neutro Abs: 9.8 10*3/uL — ABNORMAL HIGH (ref 1.7–7.7)
Neutrophils Relative %: 76 %
Platelets: 205 10*3/uL (ref 150–400)
RBC: 4.13 MIL/uL (ref 3.87–5.11)
RDW: 13.6 % (ref 11.5–15.5)
WBC: 12.9 10*3/uL — ABNORMAL HIGH (ref 4.0–10.5)
nRBC: 0 % (ref 0.0–0.2)

## 2023-04-14 LAB — COMPREHENSIVE METABOLIC PANEL
ALT: 21 U/L (ref 0–44)
AST: 27 U/L (ref 15–41)
Albumin: 3.9 g/dL (ref 3.5–5.0)
Alkaline Phosphatase: 60 U/L (ref 38–126)
Anion gap: 12 (ref 5–15)
BUN: 31 mg/dL — ABNORMAL HIGH (ref 8–23)
CO2: 23 mmol/L (ref 22–32)
Calcium: 8.7 mg/dL — ABNORMAL LOW (ref 8.9–10.3)
Chloride: 96 mmol/L — ABNORMAL LOW (ref 98–111)
Creatinine, Ser: 1.25 mg/dL — ABNORMAL HIGH (ref 0.44–1.00)
GFR, Estimated: 48 mL/min — ABNORMAL LOW (ref >60)
Glucose, Bld: 193 mg/dL — ABNORMAL HIGH (ref 70–99)
Potassium: 4.3 mmol/L (ref 3.5–5.1)
Sodium: 131 mmol/L — ABNORMAL LOW (ref 135–145)
Total Bilirubin: 1.3 mg/dL — ABNORMAL HIGH (ref 0.3–1.2)
Total Protein: 7.1 g/dL (ref 6.5–8.1)

## 2023-04-14 LAB — URINALYSIS, ROUTINE W REFLEX MICROSCOPIC
Bilirubin Urine: NEGATIVE
Glucose, UA: NEGATIVE mg/dL
Hgb urine dipstick: NEGATIVE
Ketones, ur: NEGATIVE mg/dL
Leukocytes,Ua: NEGATIVE
Nitrite: NEGATIVE
Protein, ur: NEGATIVE mg/dL
Specific Gravity, Urine: 1.01 (ref 1.005–1.030)
pH: 5.5 (ref 5.0–8.0)

## 2023-04-14 LAB — CBG MONITORING, ED
Glucose-Capillary: 187 mg/dL — ABNORMAL HIGH (ref 70–99)
Glucose-Capillary: 121 mg/dL — ABNORMAL HIGH (ref 70–99)
Glucose-Capillary: 86 mg/dL (ref 70–99)
Glucose-Capillary: 139 mg/dL — ABNORMAL HIGH (ref 70–99)

## 2023-04-14 LAB — PROTIME-INR
INR: 1.1 (ref 0.8–1.2)
Prothrombin Time: 14.5 seconds (ref 11.4–15.2)

## 2023-04-14 MED ORDER — MIDAZOLAM HCL 2 MG/2ML IJ SOLN
INTRAMUSCULAR | Status: AC | PRN
Start: 2023-04-14 — End: 2023-04-14
  Administered 2023-04-14: 1 mg via INTRAVENOUS

## 2023-04-14 MED ORDER — OXYCODONE HCL 5 MG PO TABS
5.0000 mg | ORAL_TABLET | ORAL | Status: DC | PRN
  Administered 2023-04-15 – 2023-04-16 (×4): 5 mg via ORAL
  Administered 2023-04-16 – 2023-04-17 (×3): 10 mg via ORAL
  Filled 2023-04-14 (×2): qty 1
  Filled 2023-04-14: qty 2
  Filled 2023-04-14: qty 1
  Filled 2023-04-14 (×2): qty 2
  Filled 2023-04-14: qty 1

## 2023-04-14 MED ORDER — MIDAZOLAM HCL 2 MG/2ML IJ SOLN
INTRAMUSCULAR | Status: AC
  Filled 2023-04-14: qty 2

## 2023-04-14 MED ORDER — ACETAMINOPHEN 500 MG PO TABS
1000.0000 mg | ORAL_TABLET | Freq: Four times a day (QID) | ORAL | Status: DC
  Administered 2023-04-14 – 2023-04-17 (×11): 1000 mg via ORAL
  Filled 2023-04-14 (×12): qty 2

## 2023-04-14 MED ORDER — FENTANYL CITRATE (PF) 100 MCG/2ML IJ SOLN
INTRAMUSCULAR | Status: AC
  Filled 2023-04-14: qty 2

## 2023-04-14 MED ORDER — SODIUM CHLORIDE 0.9 % IV SOLN: INTRAVENOUS | Status: DC

## 2023-04-14 MED ORDER — IOHEXOL 300 MG/ML  SOLN
100.0000 mL | Freq: Once | INTRAMUSCULAR | Status: AC | PRN
  Administered 2023-04-14: 100 mL via INTRAVENOUS

## 2023-04-14 MED ORDER — LIDOCAINE 5 % EX PTCH
1.0000 | MEDICATED_PATCH | CUTANEOUS | Status: DC
  Administered 2023-04-14 – 2023-04-16 (×3): 1 via TRANSDERMAL
  Filled 2023-04-14 (×3): qty 1

## 2023-04-14 MED ORDER — INSULIN ASPART 100 UNIT/ML IJ SOLN
0.0000 [IU] | Freq: Three times a day (TID) | INTRAMUSCULAR | Status: DC
  Administered 2023-04-16: 2 [IU] via SUBCUTANEOUS
  Administered 2023-04-16: 3 [IU] via SUBCUTANEOUS
  Administered 2023-04-16: 5 [IU] via SUBCUTANEOUS
  Administered 2023-04-17: 3 [IU] via SUBCUTANEOUS
  Administered 2023-04-17: 2 [IU] via SUBCUTANEOUS

## 2023-04-14 MED ORDER — CELECOXIB 200 MG PO CAPS
200.0000 mg | ORAL_CAPSULE | Freq: Every day | ORAL | Status: DC
  Administered 2023-04-14 – 2023-04-17 (×4): 200 mg via ORAL
  Filled 2023-04-14 (×4): qty 1

## 2023-04-14 MED ORDER — MORPHINE SULFATE (PF) 4 MG/ML IV SOLN
4.0000 mg | Freq: Once | INTRAVENOUS | Status: AC
  Administered 2023-04-14: 4 mg via INTRAVENOUS
  Filled 2023-04-14: qty 1

## 2023-04-14 MED ORDER — NICOTINE 14 MG/24HR TD PT24
14.0000 mg | MEDICATED_PATCH | Freq: Every day | TRANSDERMAL | Status: DC
  Filled 2023-04-14: qty 1

## 2023-04-14 MED ORDER — IPRATROPIUM-ALBUTEROL 0.5-2.5 (3) MG/3ML IN SOLN
3.0000 mL | Freq: Four times a day (QID) | RESPIRATORY_TRACT | Status: AC
  Administered 2023-04-14 – 2023-04-15 (×3): 3 mL via RESPIRATORY_TRACT
  Filled 2023-04-14 (×3): qty 3

## 2023-04-14 MED ORDER — SODIUM CHLORIDE 0.9 % IV BOLUS
500.0000 mL | Freq: Once | INTRAVENOUS | Status: AC
  Administered 2023-04-14: 500 mL via INTRAVENOUS

## 2023-04-14 MED ORDER — LIDOCAINE HCL 1 % IJ SOLN
10.0000 mL | Freq: Once | INTRAMUSCULAR | Status: AC
  Administered 2023-04-14: 10 mL via INTRADERMAL
  Filled 2023-04-14: qty 10

## 2023-04-14 MED ORDER — INSULIN ASPART 100 UNIT/ML IJ SOLN: 0.0000 [IU] | Freq: Every day | INTRAMUSCULAR | Status: DC

## 2023-04-14 MED ORDER — CARVEDILOL 6.25 MG PO TABS
6.2500 mg | ORAL_TABLET | Freq: Two times a day (BID) | ORAL | Status: DC
  Administered 2023-04-15 – 2023-04-17 (×5): 6.25 mg via ORAL
  Filled 2023-04-14 (×4): qty 1
  Filled 2023-04-14: qty 2

## 2023-04-14 MED ORDER — FAMOTIDINE 20 MG PO TABS
20.0000 mg | ORAL_TABLET | Freq: Every day | ORAL | Status: DC
  Administered 2023-04-14 – 2023-04-16 (×3): 20 mg via ORAL
  Filled 2023-04-14 (×3): qty 1

## 2023-04-14 MED ORDER — HYDROMORPHONE HCL 1 MG/ML IJ SOLN
0.5000 mg | INTRAMUSCULAR | Status: DC | PRN
  Administered 2023-04-14 – 2023-04-16 (×3): 1 mg via INTRAVENOUS
  Filled 2023-04-14 (×3): qty 1

## 2023-04-14 MED ORDER — FENTANYL CITRATE (PF) 100 MCG/2ML IJ SOLN
INTRAMUSCULAR | Status: AC | PRN
Start: 2023-04-14 — End: 2023-04-14
  Administered 2023-04-14: 50 ug via INTRAVENOUS

## 2023-04-14 MED ORDER — GUAIFENESIN 200 MG PO TABS
200.0000 mg | ORAL_TABLET | Freq: Four times a day (QID) | ORAL | Status: DC
  Administered 2023-04-14 – 2023-04-17 (×8): 200 mg via ORAL
  Filled 2023-04-14 (×16): qty 1

## 2023-04-14 MED ORDER — SERTRALINE HCL 50 MG PO TABS
25.0000 mg | ORAL_TABLET | Freq: Every day | ORAL | Status: DC
  Administered 2023-04-14 – 2023-04-17 (×4): 25 mg via ORAL
  Filled 2023-04-14 (×4): qty 1

## 2023-04-14 MED ORDER — MONTELUKAST SODIUM 10 MG PO TABS
10.0000 mg | ORAL_TABLET | Freq: Every day | ORAL | Status: DC
  Administered 2023-04-14 – 2023-04-17 (×4): 10 mg via ORAL
  Filled 2023-04-14 (×4): qty 1

## 2023-04-14 MED ORDER — METHOCARBAMOL 500 MG PO TABS
500.0000 mg | ORAL_TABLET | Freq: Four times a day (QID) | ORAL | Status: DC
  Administered 2023-04-14 – 2023-04-16 (×9): 500 mg via ORAL
  Filled 2023-04-14 (×9): qty 1

## 2023-04-14 NOTE — ED Provider Notes (Signed)
4:48 PM Assumed care of patient from off-going team. For more details, please see note from same day.  In brief, this is a 67 y.o. female who presents as transfer from [facility] for traumatic PTX. Per Dr. Charm Barges, needs IR placement of tube due to loculations per Dr Bedelia Person. Dr Elby Showers IR wanted transfer ASAP.   Plan/Dispo at time of sign-out & ED Course since sign-out: Go to IR for CT-guided chest tube  BP (!) 119/54   Pulse 74   Temp 98 F (36.7 C) (Oral)   Resp 19   Ht 5\' 2"  (1.575 m)   Wt 82.6 kg   SpO2 98%   BMI 33.29 kg/m    ED Course:   Clinical Course as of 04/14/23 1648  Wed Apr 14, 2023  1100 Chest x-ray showing some subcu edema on the left.  No gross pneumothorax.  Awaiting radiology reading. [MB]  1353 Received a call from radiology regarding patient's findings on chest CT.  I reached out to Dr. Bedelia Person trauma.  She is recommending IR placement of chest tube and medicine admission, trauma will consult.  I discussed with Dr. Elby Showers interventional radiology who is recommending transfer to the ED at [facility] so his team can evaluate patient for chest tube.  Patient has been accepted to ED at [facility] under Dr. Jearld Fenton.  [MB]  [phone] Patient and her daughter are updated. [MB]  [id number] Notified Dr. Bedelia Person and Dr. Elby Showers of patient's arrival. [HN]  1609 Patient was quickly taken to CT room for CT-guided chest tube placement [HN]  1647 D/w surgery service, plan will be to admit to medicine after IR tube placement. Surgery working on medical admission. Oncoming ED physician Dr. Rush Landmark made aware as well in the event that patient returns to ED after IR prior to being admitted. [HN]    Clinical Course User Index [HN] [name], MD [MB] Terrilee Files, MD    Dispo: Admit after IR tube placement ------------------------------- [name], MD Emergency Medicine  This note was created using dictation software, which may contain spelling or grammatical errors.   [name],  MD 04/14/23 [phone]

## 2023-04-14 NOTE — ED Triage Notes (Signed)
Patient presents to ED via POV from home. Here post fall. Fell last night. Amnesic to event. Patient reports remembering waking up outside. Suspects she fell off her back deck. Estimated down time was 2 hours. Positive LOC. Patient is on Effient, has not had it in 2 days. Abrasion and hematoma noted to left side of forehead. Reports right sided pain. GCS 15.

## 2023-04-14 NOTE — Consult Note (Signed)
[facility] Surgery Consult Note  Sarah Stevenson 1956/02/04  [id number].    Requesting MD: Meridee Score Chief Complaint/Reason for Consult: fall  HPI:  Sarah Stevenson is a 67 y.o. female PMH stage 1a Non-small cell lung cancer s/p LUL wedge resection 2019, CAD, Hx CABG and AVR (2019) on Effient (last dose 8/5), chronic diastolic CHF, HLD, DM who was transferred from [facility] to [facility] for admission after suffering a fall. Unsure exactly why/when she fell. Denies any prodromal symptoms, states that she was in her normal state of health earlier that day. She does report PCP changing one of her diabetes recently due to episodes of hypoglycemia.  States that she took her dog outside early Tuesday morning and woke up later that morning face down outside. She reports severe pain in her left chest as well as shortness of breath. Some nausea, no emesis. Some pain in her left forehead and left buttock as well. She was able to ambulate at home. She took some oxycodone that she had left over from hip surgery which helped a little, but due to severe pain she decided to come to the ED.  Patient was worked up by EDP and found to have Small left pneumothorax, moderate pneumomediastinum, extensive subcutaneous emphysema, pulmonary contusion, and Left 4-8 rib fractures. CT head and c-spine negative. Trauma asked to see in consult.  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  Family History  Problem Relation Age of Onset   Thyroid disease Mother    Heart attack Mother    Dementia Mother    Heart attack Father    Sudden death Father    Breast cancer Maternal Aunt    Hypertension Neg Hx    Hyperlipidemia Neg Hx    Diabetes Neg Hx     Past Medical History:  Diagnosis Date   Anginal pain (HCC)    Aortic stenosis    Coronary artery disease    Depression    "mild" (08/06/2016)   Dyspnea    Hyperlipidemia    lung ca dx'd  05/2018   Murmur, cardiac    Myocardial infarction (HCC) 2016   Osteoarthritis    "left hip and knee" (08/06/2016)   Type II diabetes mellitus (HCC)     Past Surgical History:  Procedure Laterality Date   AORTIC VALVE REPLACEMENT N/A 05/17/2018   Procedure: AORTIC VALVE REPLACEMENT (AVR) USING 21 MM MAGNA EASE PERICARDIAL BIOPROSTHESIS. MODEL # [id number]. SERIAL # [id number];  Surgeon: Delight Ovens, MD;  Location: [facility] OR;  Service: Open Heart Surgery;  Laterality: N/A;   CARDIAC CATHETERIZATION Right 11/30/2014   Procedure: RIGHT/LEFT HEART CATH AND CORONARY ANGIOGRAPHY;  Surgeon: Lyn Records, MD;  Location: [facility] CATH LAB;  Service: Cardiovascular;  Laterality: Right;   CARDIAC CATHETERIZATION N/A 12/13/2015   Procedure: Left Heart Cath and Coronary Angiography;  Surgeon: Marykay Lex, MD;  Location: [facility] INVASIVE CV LAB;  Service: Cardiovascular;  Laterality: N/A;   CARDIAC CATHETERIZATION N/A 12/13/2015   Procedure: Intravascular Pressure Wire/FFR Study;  Surgeon: Marykay Lex, MD;  Location: [facility] INVASIVE CV LAB;  Service: Cardiovascular;  Laterality: N/A;  RCA   CARDIAC CATHETERIZATION N/A 08/06/2016   Procedure: Right/Left Heart Cath and Coronary Angiography;  Surgeon: Runell Gess, MD;  [facility] INVASIVE CV LAB;  Service: Cardiovascular;  Laterality: N/A;   CARDIAC CATHETERIZATION N/A 08/06/2016   Procedure: Coronary Stent Intervention;  Surgeon: Runell Gess, MD;  Location:  [facility] INVASIVE CV LAB;  Service: Cardiovascular;  Laterality: N/A;   distal rca 3.0x16 and 3.0x8 synergy   CESAREAN SECTION  1989   CORONARY ANGIOPLASTY WITH STENT PLACEMENT  08/06/2016   "2 stents"   CORONARY ARTERY BYPASS GRAFT N/A 05/17/2018   Procedure: CORONARY ARTERY BYPASS GRAFTING (CABG) X 2 WITH ENDOSCOPIC HARVESTING OF RIGHT SAPHENOUS VEIN. LIMA TO LAD. SVG TO RCA;  Surgeon: Delight Ovens, MD;  Location: [facility] OR;  Service: Open Heart Surgery;  Laterality: N/A;   ETHMOIDECTOMY Right  07/28/2021   Procedure: ETHMOIDECTOMY;  Surgeon: Newman Pies, MD;  Location: [facility];  Service: ENT;  Laterality: Right;   FRONTAL SINUS EXPLORATION Right 07/28/2021   Procedure: FRONTAL SINUS EXPLORATION;  Surgeon: Newman Pies, MD;  Location: [facility];  Service: ENT;  Laterality: Right;   HYSTEROSCOPY DIAGNOSTIC  1990s   JOINT REPLACEMENT     KNEE ARTHROSCOPY Left 1996   MAXILLARY ANTROSTOMY Right 07/28/2021   Procedure: MAXILLARY ANTROSTOMY WITH TISSUE REMOVAL;  Surgeon: Newman Pies, MD;  Location: [facility];  Service: ENT;  Laterality: Right;   NASAL SINUS SURGERY  1977   NASAL SINUS SURGERY Right 07/28/2021   Procedure: ENDOSCOPIC SINUS SURGERY WITH STEALTH NAVIGATION;  Surgeon: Newman Pies, MD;  Location: [facility];  Service: ENT;  Laterality: Right;   RIGHT/LEFT HEART CATH AND CORONARY ANGIOGRAPHY N/A 04/01/2018   Procedure: RIGHT/LEFT HEART CATH AND CORONARY ANGIOGRAPHY;  Surgeon: Lyn Records, MD;  Location: [facility] INVASIVE CV LAB;  Service: Cardiovascular;  Laterality: N/A;   TEE WITHOUT CARDIOVERSION N/A 05/17/2018   Procedure: TRANSESOPHAGEAL ECHOCARDIOGRAM (TEE);  Surgeon: Delight Ovens, MD;  Location: [facility] OR;  Service: Open Heart Surgery;  Laterality: N/A;   TOTAL HIP ARTHROPLASTY Left 04/23/2017   Procedure: LEFT TOTAL HIP ARTHROPLASTY ANTERIOR APPROACH;  Surgeon: Kathryne Hitch, MD;  Location: [facility] ORS;  Service: Orthopedics;  Laterality: Left;   TOTAL HIP ARTHROPLASTY Right 10/17/2021   Procedure: TOTAL HIP ARTHROPLASTY ANTERIOR APPROACH;  Surgeon: Kathryne Hitch, MD;  Location: [facility] ORS;  Service: Orthopedics;  Laterality: Right;   WEDGE RESECTION Left 05/17/2018   Procedure: LEFT UPPER LOBE LUNG WEDGE RESECTION;  Surgeon: Delight Ovens, MD;  Location: [facility] OR;  Service: Open Heart Surgery;  Laterality: Left;    Social History:  reports that she quit smoking about 11 years ago. Her smoking use included  cigarettes. She started smoking about 21 years ago. She has a 10 pack-year smoking history. She has never used smokeless tobacco. She reports that she does not currently use alcohol. She reports that she does not use drugs.  Allergies:  Allergies  Allergen Reactions   Brilinta [Ticagrelor] Shortness Of Breath   Lisinopril Cough   Asa [Aspirin] Hives and Rash   Blue Dyes (Parenteral) Hives   Corn-Containing Products Hives   Nsaids Hives and Rash   Tea Hives   Rosuvastatin     myalgias   Zetia [Ezetimibe]     myalgias   Isosulfan Blue Rash   Other Itching and Rash    CHG soap    (Not in a hospital admission)   Prior to Admission medications   Medication Sig Start Date End Date Taking? Authorizing Provider  Acetaminophen (TYLENOL DISSOLVE PACKS) 500 MG PACK Take 1,000 mg by mouth every 6 (six) hours as needed (pain).    [provider]  acetaminophen (TYLENOL) 325 MG tablet Take 650 mg by mouth every 6 (six) hours as  needed for moderate pain.    [provider]  amoxicillin (AMOXIL) 500 MG capsule Take 4 capsules by mouth 1 hour before appointment 01/22/22     carvedilol (COREG) 6.25 MG tablet Take 1 tablet (6.25 mg total) by mouth 2 (two) times daily with a meal. 06/16/22   Dyann Kief, PA-C  celecoxib (CELEBREX) 200 MG capsule Take 1 capsule (200 mg total) by mouth daily as needed. 03/08/23     celecoxib (CELEBREX) 200 MG capsule Take 1 capsule (200 mg total) by mouth daily as needed for arthritis pain. 03/30/23     cetirizine (ZYRTEC) 10 MG tablet Take 10 mg by mouth daily.    [provider]  COLLAGEN PO Take 1 Scoop by mouth daily. With biotin and vitamin c    [provider]  Dulaglutide (TRULICITY) 1.5 MG/0.5ML SOPN Inject 1.5 mg into the skin once a week. 03/08/23     Evolocumab (REPATHA SURECLICK) 140 MG/ML SOAJ INJECT 140 MG INTO THE SKIN EVERY 14 DAYS 12/17/21   Lyn Records, MD  famotidine (PEPCID) 20 MG tablet Take 20 mg by mouth at  bedtime.    [provider]  fluticasone (FLONASE) 50 MCG/ACT nasal spray Place 1 spray into both nostrils daily.    [provider]  glipiZIDE (GLUCOTROL XL) 10 MG 24 hr tablet TAKE 1 TABLET BY MOUTH DAILY FOR DIABETES CONTROL. 03/15/22     glipiZIDE (GLUCOTROL XL) 5 MG 24 hr tablet Take 1 tablet (5 mg total) by mouth daily for diabetes. 03/30/23     glucose blood (ONETOUCH ULTRA) test strip TEST BLOOD SUGAR ONCE A DAY 12/11/17   [provider]  losartan (COZAAR) 50 MG tablet TAKE 1 TABLET BY MOUTH DAILY TO PRESERVE KIDNEY FUNCTION. 06/16/22   Dyann Kief, PA-C  losartan (COZAAR) 50 MG tablet Take 1 tablet (50 mg total) by mouth daily to preserve kidney function. 03/30/23     Menthol-Methyl Salicylate (MUSCLE RUB EX) Apply 1 application topically as needed (for pain).    [provider]  metFORMIN (GLUCOPHAGE) 850 MG tablet Take 1 tablet (850 mg total) by mouth 2 (two) times daily to control diabetes. 03/08/23     methocarbamol (ROBAXIN) 500 MG tablet Take one tablet 4 times a day as needed for muscle 03/05/22     montelukast (SINGULAIR) 10 MG tablet Take 1 tablet by mouth daily for allergy control 03/05/22     montelukast (SINGULAIR) 10 MG tablet Take 1 tablet (10 mg total) by mouth daily for allergy control. 09/10/22     montelukast (SINGULAIR) 10 MG tablet Take 1 tablet (10 mg total) by mouth daily for allergy control. 03/30/23     prasugrel (EFFIENT) 10 MG TABS tablet Take 1 tablet (10 mg total) by mouth daily. 07/22/22   Lyn Records, MD  Propylene Glycol (SYSTANE COMPLETE) 0.6 % SOLN Place 2 drops into both eyes daily.    [provider]  sertraline (ZOLOFT) 50 MG tablet Take 1 tablet (50 mg total) by mouth daily to control anxiety. 09/10/22       Blood pressure 120/68, pulse 77, temperature 98 F (36.7 C), temperature source Oral, resp. rate (!) 21, height 5\' 2"  (1.575 m), weight 82.6 kg, SpO2 94%. Physical Exam: General: pleasant, WD/WN female who  is laying in bed in NAD HEENT: abrasion/ecchymosis noted to left forehead/scalp.  Sclera are noninjected.  Pupils equal and round and reactive to light.  Ears and nose without any masses or lesions.  Mouth is pink and moist. Dentition fair Heart: regular, rate, and rhythm.  Normal s1,s2. No obvious murmurs, gallops, or rubs noted.  Palpable radial and pedal pulses bilaterally  Lungs: wheezing bilaterally, no rhonchi or rales noted.  Respiratory effort nonlabored on 3.5L [place] Abd: soft, NT/ND, +BS, no masses, hernias, or organomegaly MS: no significant deformity noted BUE/BLE Skin: warm and dry with no masses, lesions, or rashes Psych: A&Ox4 with an appropriate affect Neuro: MAEs, no gross motor or sensory deficits BUE/BLE  Results for orders placed or performed during the hospital encounter of 04/14/23 (from the past 48 hour(s))  Comprehensive metabolic panel     Status: Abnormal   Collection Time: 04/14/23 10:20 AM  Result Value Ref Range   Sodium 131 (L) 135 - 145 mmol/L   Potassium 4.3 3.5 - 5.1 mmol/L   Chloride 96 (L) 98 - 111 mmol/L   CO2 23 22 - 32 mmol/L   Glucose, Bld 193 (H) 70 - 99 mg/dL    Comment: Glucose reference range applies only to samples taken after fasting for at least 8 hours.   BUN 31 (H) 8 - 23 mg/dL   Creatinine, Ser 3.22 (H) 0.44 - 1.00 mg/dL   Calcium 8.7 (L) 8.9 - 10.3 mg/dL   Total Protein 7.1 6.5 - 8.1 g/dL   Albumin 3.9 3.5 - 5.0 g/dL   AST 27 15 - 41 U/L   ALT 21 0 - 44 U/L   Alkaline Phosphatase 60 38 - 126 U/L   Total Bilirubin 1.3 (H) 0.3 - 1.2 mg/dL   GFR, Estimated 48 (L) >60 mL/min    Comment: (NOTE) Calculated using the CKD-EPI Creatinine Equation (2021)    Anion gap 12 5 - 15    Comment: Performed at [facility], [address] [place], [place] [address]  CBC with Differential     Status: Abnormal   Collection Time: 04/14/23 10:20 AM  Result Value Ref Range   WBC 12.9 (H) 4.0 - 10.5 K/uL   RBC 4.13 3.87 - 5.11 MIL/uL    Hemoglobin 12.8 12.0 - 15.0 g/dL   HCT 70.6 23.7 - 62.8 %   MCV 91.3 80.0 - 100.0 fL   MCH 31.0 26.0 - 34.0 pg   MCHC 34.0 30.0 - 36.0 g/dL   RDW 31.5 17.6 - 16.0 %   Platelets 205 150 - 400 K/uL   nRBC 0.0 0.0 - 0.2 %   Neutrophils Relative % 76 %   Neutro Abs 9.8 (H) 1.7 - 7.7 K/uL   Lymphocytes Relative 13 %   Lymphs Abs 1.7 0.7 - 4.0 K/uL   Monocytes Relative 11 %   Monocytes Absolute 1.4 (H) 0.1 - 1.0 K/uL   Eosinophils Relative 0 %   Eosinophils Absolute 0.0 0.0 - 0.5 K/uL   Basophils Relative 0 %   Basophils Absolute 0.0 0.0 - 0.1 K/uL   Immature Granulocytes 0 %   Abs Immature Granulocytes 0.05 0.00 - 0.07 K/uL    Comment: Performed at [facility], [address] [place], [place] [address]  Protime-INR     Status: None   Collection Time: 04/14/23 10:20 AM  Result Value Ref Range   Prothrombin Time 14.5 11.4 - 15.2 seconds   INR 1.1 0.8 - 1.2    Comment: (NOTE) INR goal varies based on device and disease states. Performed at [facility], [address] [place], [place] [address]   CBG monitoring, ED  Status: Abnormal   Collection Time: 04/14/23 10:41 AM  Result Value Ref Range   Glucose-Capillary 187 (H) 70 - 99 mg/dL    Comment: Glucose reference range applies only to samples taken after fasting for at least 8 hours.  Urinalysis, Routine w reflex microscopic -Urine, Clean Catch     Status: None   Collection Time: 04/14/23 12:31 PM  Result Value Ref Range   Color, Urine YELLOW YELLOW   APPearance CLEAR CLEAR   Specific Gravity, Urine 1.010 1.005 - 1.030   pH 5.5 5.0 - 8.0   Glucose, UA NEGATIVE NEGATIVE mg/dL   Hgb urine dipstick NEGATIVE NEGATIVE   Bilirubin Urine NEGATIVE NEGATIVE   Ketones, ur NEGATIVE NEGATIVE mg/dL   Protein, ur NEGATIVE NEGATIVE mg/dL   Nitrite NEGATIVE NEGATIVE   Leukocytes,Ua NEGATIVE NEGATIVE    Comment: Microscopic not done on urines with negative protein, blood, leukocytes, nitrite, or glucose < 500  mg/dL. Performed at [facility], [address] [place], [place] [address]    CT CHEST ABDOMEN PELVIS W CONTRAST  Result Date: 04/14/2023 CLINICAL DATA:  Polytrauma, blunt. Fall. Prior left upper lobe malignancy. EXAM: CT CHEST, ABDOMEN, AND PELVIS WITH CONTRAST TECHNIQUE: Multidetector CT imaging of the chest, abdomen and pelvis was performed following the standard protocol during bolus administration of intravenous contrast. RADIATION DOSE REDUCTION: This exam was performed according to the departmental dose-optimization program which includes automated exposure control, adjustment of the mA and/or kV according to patient size and/or use of iterative reconstruction technique. CONTRAST:  OMNIPAQUE IOHEXOL 300 MG/ML  SOLN COMPARISON:  Chest x-ray 04/14/2023, pet CT 06/29/2018 FINDINGS: CHEST: Cardiovascular: No aortic injury. The thoracic aorta is normal in caliber. The heart is normal in size. No significant pericardial effusion. Severe atherosclerotic plaque. Aortic valve replacement. Four-vessel coronary calcification. Mediastinum/Nodes: Moderate volume pneumomediastinum.  No mediastinal hematoma. The esophagus is unremarkable. The thyroid is unremarkable. The central airways are patent. No mediastinal, hilar, or axillary lymphadenopathy. Lungs/Pleura: Left lower lobe pulmonary contusion. Left upper lobe surgical changes and scarring from wedge resection. No pulmonary nodule. No pulmonary mass. No definite pulmonary laceration. No pneumatocele formation. No pleural effusion. At least small volume loculated left lower pneumothorax. No hemothorax. Musculoskeletal/Chest wall: Extensive subcutaneus soft tissue emphysema along the left chest wall, left back, bilateral neck. Acute full-shaft wdith displaced anterolateral left 4-8 rib fractures. Acute nondisplaced posterior left 5-6 rib fractures. No sternal fracture. No scapular fracture. No clavicular fracture. Intact sternotomy wires. No  spinal fracture. ABDOMEN / PELVIS: Hepatobiliary: Not enlarged. No focal lesion. No laceration or subcapsular hematoma. The gallbladder is otherwise unremarkable with no radio-opaque gallstones. No biliary ductal dilatation. Pancreas: Normal pancreatic contour. No main pancreatic duct dilatation. Spleen: Not enlarged. No focal lesion. No laceration, subcapsular hematoma, or vascular injury. Adrenals/Urinary Tract: No nodularity bilaterally. Bilateral kidneys enhance symmetrically. No hydronephrosis. No contusion, laceration, or subcapsular hematoma. No injury to the vascular structures or collecting systems. No hydroureter. The urinary bladder is grossly unremarkable with limited evaluation due to streak artifact originating from bilateral femoral surgical hardware. On delayed imaging, there is no urothelial wall thickening and there are no filling defects in the opacified portions of the bilateral collecting systems or ureters. Stomach/Bowel: No small or large bowel wall thickening or dilatation. The appendix is unremarkable. Vasculature/Lymphatics: Severe atherosclerotic plaque. No abdominal aorta or iliac aneurysm. No active contrast extravasation or pseudoaneurysm. No abdominal, pelvic, inguinal lymphadenopathy. Reproductive: Coarsened uterine lesions suggestive of degenerative uterine fibroids. Uterus and bilateral adnexal regions  are otherwise unremarkable. Slightly limited evaluation due to streak artifact originating from bilateral hip surgical hardware. Other: No simple free fluid ascites. No pneumoperitoneum. No hemoperitoneum. No mesenteric hematoma identified. No organized fluid collection. Musculoskeletal: Left hip film flank subcutaneus soft tissue hematoma formation. Left chest subcutaneus soft tissue emphysema extends inferiorly along the left flank soft tissues and anterior abdominal soft tissue/groin. Multilevel degenerative changes spine with intervertebral disc space vacuum phenomenon and  endplate sclerosis at the L2-L3 level. Grade 1 anterolisthesis of L4 on L5. No acute pelvic fracture. No spinal fracture. Partially visualized total bilateral hip arthroplasty. No CT findings to suggest surgical hardware complication. Ports and Devices: None. IMPRESSION: 1. At least small volume loculated left lower pneumothorax. 2. Moderate volume pneumomediastinum. Finding likely due to trauma in a patient status post left upper lobe wedge resection and surgical changes. No definite other findings of esophageal perforation. 3. Left lower lobe pulmonary contusion. 4. Extensive subcutaneus soft tissue emphysema of the left anterior chest, abdomen, back, neck (bilateral neck). 5. Acute full-shaft width displaced left anterolateral 4-8 rib fractures. Acute nondisplaced posterior left 5-6 rib fractures. No flail chest. 6. Left hip film flank subcutaneus soft tissue hematoma formation. 7. No acute intra-abdominal or intrapelvic traumatic injury. 8. No acute fracture or traumatic malalignment of the thoracic or lumbar spine. 9. Other imaging findings of potential clinical significance: Aortic Atherosclerosis (ICD10-I70.0) including least 4 vessel coronary artery. Status post aortic valve replacement. Total bilateral hip arthroplasty partially visualized. These results were called by telephone at the time of interpretation on 04/14/2023 at 1:25 pm to provider [facility] , who verbally acknowledged these results. Electronically Signed   By: Tish Frederickson M.D.   On: 04/14/2023 13:41   CT Head Wo Contrast  Result Date: 04/14/2023 CLINICAL DATA:  Head trauma, minor (Age >= 65y); Neck trauma (Age >= 65y). EXAM: CT HEAD WITHOUT CONTRAST CT CERVICAL SPINE WITHOUT CONTRAST TECHNIQUE: Multidetector CT imaging of the head and cervical spine was performed following the standard protocol without intravenous contrast. Multiplanar CT image reconstructions of the cervical spine were also generated. RADIATION DOSE REDUCTION: This  exam was performed according to the departmental dose-optimization program which includes automated exposure control, adjustment of the mA and/or kV according to patient size and/or use of iterative reconstruction technique. COMPARISON:  None available. FINDINGS: CT HEAD FINDINGS Brain: No acute intracranial hemorrhage. Gray-white differentiation is preserved. No hydrocephalus or extra-axial collection. No mass effect or midline shift. Vascular: No hyperdense vessel or unexpected calcification. Skull: No calvarial fracture or suspicious bone lesion. Skull base is unremarkable. Sinuses/Orbits: No acute finding. Other: Left frontotemporal scalp hematoma. Subcutaneous emphysema dissecting along the deep fascial planes of the neck to the skull base. CT CERVICAL SPINE FINDINGS Alignment: Normal. Skull base and vertebrae: No acute fracture. Normal craniocervical junction. No suspicious bone lesions. Soft tissues and spinal canal: No prevertebral fluid or swelling. No visible canal hematoma. Disc levels: Multilevel cervical spondylosis, worst at C5-6, where there is at least mild spinal canal stenosis. Upper chest: Trace left apical pneumothorax, better evaluated on same day chest CT. Extensive pneumomediastinum and subcutaneous emphysema tracking along the deep fascial planes of the neck, left-greater-than-right. Other: Atherosclerotic calcifications of the carotid bulbs. IMPRESSION: 1. No acute intracranial abnormality. 2. Left frontotemporal scalp hematoma without underlying calvarial fracture. 3. No acute cervical spine fracture or traumatic listhesis. 4. Trace left apical pneumothorax, better evaluated on same day chest CT. Extensive pneumomediastinum and subcutaneous emphysema tracking along the deep fascial planes of the neck, left-greater-than-right.  Electronically Signed   By: Orvan Falconer M.D.   On: 04/14/2023 13:16   CT Cervical Spine Wo Contrast  Result Date: 04/14/2023 CLINICAL DATA:  Head trauma, minor  (Age >= 65y); Neck trauma (Age >= 65y). EXAM: CT HEAD WITHOUT CONTRAST CT CERVICAL SPINE WITHOUT CONTRAST TECHNIQUE: Multidetector CT imaging of the head and cervical spine was performed following the standard protocol without intravenous contrast. Multiplanar CT image reconstructions of the cervical spine were also generated. RADIATION DOSE REDUCTION: This exam was performed according to the departmental dose-optimization program which includes automated exposure control, adjustment of the mA and/or kV according to patient size and/or use of iterative reconstruction technique. COMPARISON:  None available. FINDINGS: CT HEAD FINDINGS Brain: No acute intracranial hemorrhage. Gray-white differentiation is preserved. No hydrocephalus or extra-axial collection. No mass effect or midline shift. Vascular: No hyperdense vessel or unexpected calcification. Skull: No calvarial fracture or suspicious bone lesion. Skull base is unremarkable. Sinuses/Orbits: No acute finding. Other: Left frontotemporal scalp hematoma. Subcutaneous emphysema dissecting along the deep fascial planes of the neck to the skull base. CT CERVICAL SPINE FINDINGS Alignment: Normal. Skull base and vertebrae: No acute fracture. Normal craniocervical junction. No suspicious bone lesions. Soft tissues and spinal canal: No prevertebral fluid or swelling. No visible canal hematoma. Disc levels: Multilevel cervical spondylosis, worst at C5-6, where there is at least mild spinal canal stenosis. Upper chest: Trace left apical pneumothorax, better evaluated on same day chest CT. Extensive pneumomediastinum and subcutaneous emphysema tracking along the deep fascial planes of the neck, left-greater-than-right. Other: Atherosclerotic calcifications of the carotid bulbs. IMPRESSION: 1. No acute intracranial abnormality. 2. Left frontotemporal scalp hematoma without underlying calvarial fracture. 3. No acute cervical spine fracture or traumatic listhesis. 4. Trace left  apical pneumothorax, better evaluated on same day chest CT. Extensive pneumomediastinum and subcutaneous emphysema tracking along the deep fascial planes of the neck, left-greater-than-right. Electronically Signed   By: Orvan Falconer M.D.   On: 04/14/2023 13:16   DG Chest Port 1 View  Result Date: 04/14/2023 CLINICAL DATA:  Status post fall EXAM: PORTABLE CHEST 1 VIEW COMPARISON:  Chest radiograph dated 09/29/2018 FINDINGS: Extensive left chest wall and left neck subcutaneous emphysema. Left basilar patchy and dense left retrocardiac opacity. Trace left pneumothorax. Similar postsurgical cardiomediastinal silhouette. Multiple displaced left rib fractures. Median sternotomy wires are nondisplaced. IMPRESSION: 1. Multiple displaced left rib fractures with trace left pneumothorax and extensive left chest wall and left neck subcutaneous emphysema. 2. Left basilar patchy and dense left retrocardiac opacity, may reflect a combination of atelectasis and pulmonary contusion. These results were called by telephone at the time of interpretation on 04/14/2023 at 11:52 am to provider [facility] , who verbally acknowledged these results. Electronically Signed   By: Agustin Cree M.D.   On: 04/14/2023 11:52      Assessment/Plan Fall Small L PTX, pneumomediastinum, subcutaneous emphysema, pulm contusion - given h/o wedge resection and CABG recommend IR consult for chest tube placement, they have been consulted.  L 4-8 rib fxs - multimodal pain control, pulm toilet. Schedule tylenol, robaxin, lidocaine patch, and her home celebrex; oxy PRN. Scheduled guaifenesin and duonebs x24 hours for wheezing L hip hematoma - pain control  --per TRH-- Hyponatremia AKI Stage 1a Non-small cell lung cancer s/p LUL wedge resection 2019 CAD, Hx CABG and AVR (2019), on Effient  Chronic diastolic CHF HLD DM Tobacco abuse  ID - none indicated VTE - SCDs, ok for chemical dvt ppx from trauma standpoint FEN - IVF, NPO  for possible  procedure Foley - none  Dispo - Admit to medicine. IR consult for chest tube. Therapies.   I reviewed ED provider notes, last 24 h vitals and pain scores, last 24 h labs and trends, and last 24 h imaging results.   Franne Forts, PA-C [facility] 04/14/2023, 2:54 PM Please see Amion for pager number during day hours 7:00am-4:30pm

## 2023-04-14 NOTE — ED Notes (Signed)
Patient transported to CT 

## 2023-04-14 NOTE — TOC CAGE-AID Note (Signed)
Transition of Care [facility]) - CAGE-AID Screening   Patient Details  Name: Sarah Stevenson MRN: [id number] Date of Birth: 11-25-1955  Transition of Care [facility]) CM/SW Contact:    Janora Norlander, RN Phone Number: [phone] 04/14/2023, 7:09 PM   Clinical Narrative: Pt here after falling off of her porch last night and laid there for approx 2 hrs. Pt has L rib fractures and a pneumothorax.  Pt denies alcohol or drug use.  Screening complete.   CAGE-AID Screening:    Have You Ever Felt You Ought to Cut Down on Your Drinking or Drug Use?: No Have People Annoyed You By Critizing Your Drinking Or Drug Use?: No Have You Felt Bad Or Guilty About Your Drinking Or Drug Use?: No Have You Ever Had a Drink or Used Drugs First Thing In The Morning to Steady Your Nerves or to Get Rid of a Hangover?: No CAGE-AID Score: 0  Substance Abuse Education Offered: No

## 2023-04-14 NOTE — Procedures (Signed)
Interventional Radiology Procedure Note  Procedure: Image guided left chest tube placement, 20F pigtail drain.  Complications: None  EBL: None Sample: No culture, performed for PTX  Recommendations: - Routine chest tube care.  Travel on water seal, to suction otherwise - routine wound care and dressings  Signed,  Yvone Neu. Loreta Ave, DO, ABVM, RPVI

## 2023-04-14 NOTE — ED Notes (Signed)
Report received from [place], RN 58fr chest tube left side  1mg  versed fent Low wall suction  No complications, pt to return to ED.

## 2023-04-14 NOTE — ED Notes (Signed)
Pt bib [facility] from [facility] after falling today and being down for 2 hours. Pt arrives to [facility] needed a chest tube. 3LNC

## 2023-04-14 NOTE — ED Provider Notes (Signed)
[place] [facility] Provider Note   CSN: [id number] Arrival date & time: 04/14/23  4098     History {Add pertinent medical, surgical, social history, OB history to HPI:1} Chief Complaint  Patient presents with   Sarah Stevenson is a 67 y.o. female.  She has a history of cancer status postresection, aortic valve replacement on prasurgrel.  She woke up at the bottom of 3 steps outside yesterday morning.  She thinks she might of fallen taking out her dog.  She does not recall the fall.  She had severe pain in the left side of her chest and flank.  He said it took a few hours to be able to roll over and get up.  She took some oxycodone yesterday for the pain.  Symptoms continued today.  She has abrasions on her head.  Difficulty with breathing, twisting turning.  She denies any numbness or weakness in her arms or legs.  The history is provided by the patient and a relative.  Fall This is a new problem. The current episode started yesterday. The problem has not changed since onset.Associated symptoms include chest pain, headaches and shortness of breath. Pertinent negatives include no abdominal pain. The symptoms are aggravated by bending and twisting. Nothing relieves the symptoms. She has tried rest for the symptoms. The treatment provided no relief.       Home Medications Prior to Admission medications   Medication Sig Start Date End Date Taking? Authorizing Provider  Acetaminophen (TYLENOL DISSOLVE PACKS) 500 MG PACK Take 1,000 mg by mouth every 6 (six) hours as needed (pain).    [provider]  acetaminophen (TYLENOL) 325 MG tablet Take 650 mg by mouth every 6 (six) hours as needed for moderate pain.    [provider]  amoxicillin (AMOXIL) 500 MG capsule Take 4 capsules by mouth 1 hour before appointment 01/22/22     carvedilol (COREG) 6.25 MG tablet Take 1 tablet (6.25 mg total) by mouth 2 (two) times daily with a meal.  06/16/22   Dyann Kief, PA-C  celecoxib (CELEBREX) 200 MG capsule Take 1 capsule (200 mg total) by mouth daily as needed. 03/08/23     celecoxib (CELEBREX) 200 MG capsule Take 1 capsule (200 mg total) by mouth daily as needed for arthritis pain. 03/30/23     cetirizine (ZYRTEC) 10 MG tablet Take 10 mg by mouth daily.    [provider]  COLLAGEN PO Take 1 Scoop by mouth daily. With biotin and vitamin c    [provider]  Dulaglutide (TRULICITY) 1.5 MG/0.5ML SOPN Inject 1.5 mg into the skin once a week. 03/08/23     Evolocumab (REPATHA SURECLICK) 140 MG/ML SOAJ INJECT 140 MG INTO THE SKIN EVERY 14 DAYS 12/17/21   Lyn Records, MD  famotidine (PEPCID) 20 MG tablet Take 20 mg by mouth at bedtime.    [provider]  fluticasone (FLONASE) 50 MCG/ACT nasal spray Place 1 spray into both nostrils daily.    [provider]  glipiZIDE (GLUCOTROL XL) 10 MG 24 hr tablet TAKE 1 TABLET BY MOUTH DAILY FOR DIABETES CONTROL. 03/15/22     glipiZIDE (GLUCOTROL XL) 5 MG 24 hr tablet Take 1 tablet (5 mg total) by mouth daily for diabetes. 03/30/23     glucose blood (ONETOUCH ULTRA) test strip TEST BLOOD SUGAR ONCE A DAY 12/11/17   [provider]  losartan (COZAAR) 50 MG tablet TAKE 1 TABLET  BY MOUTH DAILY TO PRESERVE KIDNEY FUNCTION. 06/16/22   Dyann Kief, PA-C  losartan (COZAAR) 50 MG tablet Take 1 tablet (50 mg total) by mouth daily to preserve kidney function. 03/30/23     Menthol-Methyl Salicylate (MUSCLE RUB EX) Apply 1 application topically as needed (for pain).    [provider]  metFORMIN (GLUCOPHAGE) 850 MG tablet Take 1 tablet (850 mg total) by mouth 2 (two) times daily to control diabetes. 03/08/23     methocarbamol (ROBAXIN) 500 MG tablet Take one tablet 4 times a day as needed for muscle 03/05/22     montelukast (SINGULAIR) 10 MG tablet Take 1 tablet by mouth daily for allergy control 03/05/22     montelukast (SINGULAIR) 10 MG tablet Take 1 tablet (10  mg total) by mouth daily for allergy control. 09/10/22     montelukast (SINGULAIR) 10 MG tablet Take 1 tablet (10 mg total) by mouth daily for allergy control. 03/30/23     prasugrel (EFFIENT) 10 MG TABS tablet Take 1 tablet (10 mg total) by mouth daily. 07/22/22   Lyn Records, MD  Propylene Glycol (SYSTANE COMPLETE) 0.6 % SOLN Place 2 drops into both eyes daily.    [provider]  sertraline (ZOLOFT) 50 MG tablet Take 1 tablet (50 mg total) by mouth daily to control anxiety. 09/10/22         Allergies    Brilinta [ticagrelor], Lisinopril, Asa [aspirin], Blue dyes (parenteral), Corn-containing products, Nsaids, Tea, Rosuvastatin, Zetia [ezetimibe], Isosulfan blue, and Other    Review of Systems   Review of Systems  Constitutional:  Negative for fever.  Eyes:  Positive for visual disturbance.  Respiratory:  Positive for shortness of breath.   Cardiovascular:  Positive for chest pain.  Gastrointestinal:  Negative for abdominal pain.  Genitourinary:  Negative for dysuria.  Musculoskeletal:  Positive for back pain.  Skin:  Positive for wound.  Neurological:  Positive for headaches.    Physical Exam Updated Vital Signs BP (!) 154/65   Pulse 79   Temp 98 F (36.7 C)   Resp 17   Ht 5\' 2"  (1.575 m)   Wt 82.6 kg   SpO2 96%   BMI 33.29 kg/m  Physical Exam Vitals and nursing note reviewed.  Constitutional:      General: She is not in acute distress.    Appearance: Normal appearance. She is well-developed.  HENT:     Head: Normocephalic.     Comments: She has abrasions over her upper left forehead and scalp Eyes:     Conjunctiva/sclera: Conjunctivae normal.  Cardiovascular:     Rate and Rhythm: Normal rate and regular rhythm.     Heart sounds: No murmur heard. Pulmonary:     Effort: Pulmonary effort is normal. No respiratory distress.     Breath sounds: Normal breath sounds.  Chest:    Abdominal:     Palpations: Abdomen is soft.     Tenderness: There is no  abdominal tenderness. There is no guarding or rebound.  Musculoskeletal:        General: Signs of injury present. No swelling. Normal range of motion.     Cervical back: Neck supple.     Comments: She has some abrasions and bruising over her knees.  Full range of motion.  Hips nontender.  Ankles nontender.  Bilateral upper extremities full range of motion without any pain or limitations.  She does have tenderness of her left chest wall.  Skin:    General:  Skin is warm and dry.     Capillary Refill: Capillary refill takes less than 2 seconds.     Findings: Bruising present.  Neurological:     General: No focal deficit present.     Mental Status: She is alert and oriented to person, place, and time.     Cranial Nerves: No cranial nerve deficit.     Sensory: No sensory deficit.     Motor: No weakness.     ED Results / Procedures / Treatments   Labs (all labs ordered are listed, but only abnormal results are displayed) Labs Reviewed - No data to display  EKG None  Radiology No results found.  Procedures Procedures  {Document cardiac monitor, telemetry assessment procedure when appropriate:1}  Medications Ordered in ED Medications  morphine (PF) 4 MG/ML injection 4 mg (has no administration in time range)  sodium chloride 0.9 % bolus 500 mL (has no administration in time range)    ED Course/ Medical Decision Making/ A&P   {   Click here for ABCD2, HEART and other calculatorsREFRESH Note before signing :1}                              Medical Decision Making Amount and/or Complexity of Data Reviewed Labs: ordered. Radiology: ordered.  Risk Prescription drug management.   This patient complains of ***; this involves an extensive number of treatment Options and is a complaint that carries with it a high risk of complications and morbidity. The differential includes ***  I ordered, reviewed and interpreted labs, which included *** I ordered medication *** and reviewed  PMP when indicated. I ordered imaging studies which included *** and I independently    visualized and interpreted imaging which showed *** Additional history obtained from *** Previous records obtained and reviewed *** I consulted *** and discussed lab and imaging findings and discussed disposition.  Cardiac monitoring reviewed, *** Social determinants considered, *** Critical Interventions: ***  After the interventions stated above, I reevaluated the patient and found *** Admission and further testing considered, ***   {Document critical care time when appropriate:1} {Document review of labs and clinical decision tools ie heart score, Chads2Vasc2 etc:1}  {Document your independent review of radiology images, and any outside records:1} {Document your discussion with family members, caretakers, and with consultants:1} {Document social determinants of health affecting pt's care:1} {Document your decision making why or why not admission, treatments were needed:1} Final Clinical Impression(s) / ED Diagnoses Final diagnoses:  None    Rx / DC Orders ED Discharge Orders     None

## 2023-04-14 NOTE — H&P (Addendum)
PCP:   Barbie Banner, MD   Chief Complaint:  Unwitnessed fall  HPI: This is a 67 year old female with past medical history of CAD sp CABG, AS sp AVR, HLD, depression, T2DM, history of lung cancer.  Patient has a dog that she states wakes her up at times of night.  She believes this is what happened, resulting in her falling while going down steps on her deck.  Patient hit her head, suffered LOC. She woke at approximately 10 AM yesterday morning.  She does not recall the events from the night.  Yesterday she stayed home.  She had left-sided chest pain that persisted and worsened.  She endorses shortness of breath.  Today she called her daughter-in-law who brought her to the ER.  Patient maintained on Effient, her last dose was Monday.  In the ER patient's vitals were stable afebrile, HR 76.  Imaging showed to scalp hematoma, approximately 5 or left-sided rib fractures, pneumomediastinum, small loculated pneumothorax, along with other findings.  Creatinine 1.25, baseline normal.  Patient was seen by surgery who will follow along.  She was seen by IR in the ED where chest tube/pigtail catheter placed.  Hospitalist asked admit.  Review of Systems:  Per HPI  Past Medical History: Past Medical History:  Diagnosis Date   Anginal pain (HCC)    Aortic stenosis    Coronary artery disease    Depression    "mild" (08/06/2016)   Dyspnea    Hyperlipidemia    lung ca dx'd 05/2018   Murmur, cardiac    Myocardial infarction (HCC) 2016   Osteoarthritis    "left hip and knee" (08/06/2016)   Type II diabetes mellitus (HCC)    Past Surgical History:  Procedure Laterality Date   AORTIC VALVE REPLACEMENT N/A 05/17/2018   Procedure: AORTIC VALVE REPLACEMENT (AVR) USING 21 MM MAGNA EASE PERICARDIAL BIOPROSTHESIS. MODEL # [id number]. SERIAL # [id number];  Surgeon: Delight Ovens, MD;  Location: [facility] OR;  Service: Open Heart Surgery;  Laterality: N/A;   CARDIAC CATHETERIZATION Right 11/30/2014   Procedure:  RIGHT/LEFT HEART CATH AND CORONARY ANGIOGRAPHY;  Surgeon: Lyn Records, MD;  Location: [facility] CATH LAB;  Service: Cardiovascular;  Laterality: Right;   CARDIAC CATHETERIZATION N/A 12/13/2015   Procedure: Left Heart Cath and Coronary Angiography;  Surgeon: Marykay Lex, MD;  Location: [facility] INVASIVE CV LAB;  Service: Cardiovascular;  Laterality: N/A;   CARDIAC CATHETERIZATION N/A 12/13/2015   Procedure: Intravascular Pressure Wire/FFR Study;  Surgeon: Marykay Lex, MD;  Location: [facility] INVASIVE CV LAB;  Service: Cardiovascular;  Laterality: N/A;  RCA   CARDIAC CATHETERIZATION N/A 08/06/2016   Procedure: Right/Left Heart Cath and Coronary Angiography;  Surgeon: Runell Gess, MD;  Location: [facility] INVASIVE CV LAB;  Service: Cardiovascular;  Laterality: N/A;   CARDIAC CATHETERIZATION N/A 08/06/2016   Procedure: Coronary Stent Intervention;  Surgeon: Runell Gess, MD;  Location: [facility] INVASIVE CV LAB;  Service: Cardiovascular;  Laterality: N/A;   distal rca 3.0x16 and 3.0x8 synergy   CESAREAN SECTION  1989   CORONARY ANGIOPLASTY WITH STENT PLACEMENT  08/06/2016   "2 stents"   CORONARY ARTERY BYPASS GRAFT N/A 05/17/2018   Procedure: CORONARY ARTERY BYPASS GRAFTING (CABG) X 2 WITH ENDOSCOPIC HARVESTING OF RIGHT SAPHENOUS VEIN. LIMA TO LAD. SVG TO RCA;  Surgeon: Delight Ovens, MD;  Location: [facility] OR;  Service: Open Heart Surgery;  Laterality: N/A;   ETHMOIDECTOMY Right 07/28/2021   Procedure: ETHMOIDECTOMY;  Surgeon: Newman Pies, MD;  Location: [facility];  Service: ENT;  Laterality: Right;   FRONTAL SINUS EXPLORATION Right 07/28/2021   Procedure: FRONTAL SINUS EXPLORATION;  Surgeon: Newman Pies, MD;  Location: [facility];  Service: ENT;  Laterality: Right;   HYSTEROSCOPY DIAGNOSTIC  1990s   JOINT REPLACEMENT     KNEE ARTHROSCOPY Left 1996   MAXILLARY ANTROSTOMY Right 07/28/2021   Procedure: MAXILLARY ANTROSTOMY WITH TISSUE REMOVAL;  Surgeon: Newman Pies, MD;  Location: [facility];  Service: ENT;  Laterality: Right;   NASAL SINUS SURGERY  1977   NASAL SINUS SURGERY Right 07/28/2021   Procedure: ENDOSCOPIC SINUS SURGERY WITH STEALTH NAVIGATION;  Surgeon: Newman Pies, MD;  Location: [facility];  Service: ENT;  Laterality: Right;   RIGHT/LEFT HEART CATH AND CORONARY ANGIOGRAPHY N/A 04/01/2018   Procedure: RIGHT/LEFT HEART CATH AND CORONARY ANGIOGRAPHY;  Surgeon: Lyn Records, MD;  Location: [facility] INVASIVE CV LAB;  Service: Cardiovascular;  Laterality: N/A;   TEE WITHOUT CARDIOVERSION N/A 05/17/2018   Procedure: TRANSESOPHAGEAL ECHOCARDIOGRAM (TEE);  Surgeon: Delight Ovens, MD;  Location: [facility] OR;  Service: Open Heart Surgery;  Laterality: N/A;   TOTAL HIP ARTHROPLASTY Left 04/23/2017   Procedure: LEFT TOTAL HIP ARTHROPLASTY ANTERIOR APPROACH;  Surgeon: Kathryne Hitch, MD;  Location: [facility] ORS;  Service: Orthopedics;  Laterality: Left;   TOTAL HIP ARTHROPLASTY Right 10/17/2021   Procedure: TOTAL HIP ARTHROPLASTY ANTERIOR APPROACH;  Surgeon: Kathryne Hitch, MD;  Location: [facility] ORS;  Service: Orthopedics;  Laterality: Right;   WEDGE RESECTION Left 05/17/2018   Procedure: LEFT UPPER LOBE LUNG WEDGE RESECTION;  Surgeon: Delight Ovens, MD;  Location: [facility] OR;  Service: Open Heart Surgery;  Laterality: Left;    Medications: Prior to Admission medications   Medication Sig Start Date End Date Taking? Authorizing Provider  Acetaminophen (TYLENOL DISSOLVE PACKS) 500 MG PACK Take 1,000 mg by mouth every 6 (six) hours as needed (pain).   Yes [provider]  acetaminophen (TYLENOL) 325 MG tablet Take 650 mg by mouth every 6 (six) hours as needed for moderate pain.   Yes [provider]  amoxicillin (AMOXIL) 500 MG capsule Take 4 capsules by mouth 1 hour before appointment 01/22/22  Yes   carvedilol (COREG) 6.25 MG tablet Take 1 tablet (6.25 mg total) by mouth 2 (two) times daily with a meal. 06/16/22  Yes Dyann Kief,  PA-C  cetirizine (ZYRTEC) 10 MG tablet Take 25 mg by mouth daily.   Yes [provider]  COLLAGEN PO Take 1 Scoop by mouth daily. With biotin and vitamin c   Yes [provider]  Dulaglutide (TRULICITY) 1.5 MG/0.5ML SOPN Inject 1.5 mg into the skin once a week. 03/08/23  Yes   Evolocumab (REPATHA SURECLICK) 140 MG/ML SOAJ INJECT 140 MG INTO THE SKIN EVERY 14 DAYS 12/17/21  Yes Lyn Records, MD  famotidine (PEPCID) 20 MG tablet Take 20 mg by mouth at bedtime.   Yes [provider]  fluticasone (FLONASE) 50 MCG/ACT nasal spray Place 1 spray into both nostrils daily.   Yes [provider]  glipiZIDE (GLUCOTROL XL) 10 MG 24 hr tablet TAKE 1 TABLET BY MOUTH DAILY FOR DIABETES CONTROL. 03/15/22  Yes   glipiZIDE (GLUCOTROL XL) 5 MG 24 hr tablet Take 1 tablet (5 mg total) by mouth daily for diabetes. 03/30/23  Yes   losartan (COZAAR) 50 MG tablet TAKE 1 TABLET BY MOUTH DAILY TO PRESERVE KIDNEY FUNCTION. 06/16/22  Yes Dyann Kief, PA-C  Menthol-Methyl Salicylate (MUSCLE RUB EX) Apply 1 application topically as needed (for pain).   Yes [provider]  metFORMIN (GLUCOPHAGE) 850 MG tablet Take 1 tablet (850 mg total) by mouth 2 (two) times daily to control diabetes. 03/08/23  Yes   methocarbamol (ROBAXIN) 500 MG tablet Take one tablet 4 times a day as needed for muscle 03/05/22  Yes   montelukast (SINGULAIR) 10 MG tablet Take 1 tablet by mouth daily for allergy control 03/05/22  Yes   Povidone (IVIZIA DRY EYES OP) Apply 2 drops to eye daily.   Yes [provider]  sertraline (ZOLOFT) 50 MG tablet Take 1 tablet (50 mg total) by mouth daily to control anxiety. Patient taking differently: Take 25 mg by mouth daily. 09/10/22  Yes   celecoxib (CELEBREX) 200 MG capsule Take 1 capsule (200 mg total) by mouth daily as needed. 03/08/23     glucose blood (ONETOUCH ULTRA) test strip TEST BLOOD SUGAR ONCE A DAY 12/11/17   [provider]  prasugrel (EFFIENT) 10 MG  TABS tablet Take 1 tablet (10 mg total) by mouth daily. 07/22/22   Lyn Records, MD    Allergies:   Allergies  Allergen Reactions   Brilinta [Ticagrelor] Shortness Of Breath   Lisinopril Cough   Asa [Aspirin] Hives and Rash   Blue Dyes (Parenteral) Hives   Corn-Containing Products Hives   Nsaids Hives and Rash   Tea Hives   Rosuvastatin     myalgias   Statins    Zetia [Ezetimibe]     myalgias   Isosulfan Blue Rash   Other Itching and Rash    CHG soap    Social History:  reports that she quit smoking about 11 years ago. Her smoking use included cigarettes. She started smoking about 21 years ago. She has a 10 pack-year smoking history. She has never used smokeless tobacco. She reports that she does not currently use alcohol. She reports that she does not use drugs.  Family History: Family History  Problem Relation Age of Onset   Thyroid disease Mother    Heart attack Mother    Dementia Mother    Heart attack Father    Sudden death Father    Breast cancer Maternal Aunt    Hypertension Neg Hx    Hyperlipidemia Neg Hx    Diabetes Neg Hx     Physical Exam: Vitals:   04/14/23 1700 04/14/23 1730 04/14/23 1800 04/14/23 1830  BP: 135/69 116/62 (!) 116/46 126/61  Pulse: 77 77 79 83  Resp: 20 (!) 21 (!) 24 (!) 22  Temp: 98 F (36.7 C) 98 F (36.7 C) 98.3 F (36.8 C) 98.3 F (36.8 C)  TempSrc: Oral Oral Oral Oral  SpO2: 99% 97% 95% 94%  Weight:      Height:        General:  Alert and oriented times three, well developed and nourished.  Febrile appearing female.  Weak cough.  Left frontal lobe bruising Eyes: No scleral icterus ENT: Mild extinguishable subcutaneous crepitation around neck Lungs: CTA E/L. Cardiovascular: RRR. No carotid bruits.  Left-sided chest tube Abdomen: soft, positive BS, non-tender, non-distended, no organomegaly, not an acute abdomen GU: not examined Neuro: CN II - XII grossly intact, sensation intact Musculoskeletal: strength 5/5 all  extremities.  Left subcu buttock bruising Skin: no rash,  no decubitus Psych: appropriate patient  Labs on Admission:  Recent Labs    04/14/23 1020  NA 131*  K 4.3  CL 96*  CO2 23  GLUCOSE 193*  BUN 31*  CREATININE 1.25*  CALCIUM 8.7*   Recent Labs    04/14/23 1020  AST 27  ALT 21  ALKPHOS 60  BILITOT 1.3*  PROT 7.1  ALBUMIN 3.9    Recent Labs    04/14/23 1020  WBC 12.9*  NEUTROABS 9.8*  HGB 12.8  HCT 37.7  MCV 91.3  PLT 205     Radiological Exams on Admission: CT CHEST ABDOMEN PELVIS W CONTRAST  Result Date: 04/14/2023 CLINICAL DATA:  Polytrauma, blunt. Fall. Prior left upper lobe malignancy. EXAM: CT CHEST, ABDOMEN, AND PELVIS WITH CONTRAST TECHNIQUE: Multidetector CT imaging of the chest, abdomen and pelvis was performed following the standard protocol during bolus administration of intravenous contrast. RADIATION DOSE REDUCTION: This exam was performed according to the departmental dose-optimization program which includes automated exposure control, adjustment of the mA and/or kV according to patient size and/or use of iterative reconstruction technique. CONTRAST:  OMNIPAQUE IOHEXOL 300 MG/ML  SOLN COMPARISON:  Chest x-ray 04/14/2023, pet CT 06/29/2018 FINDINGS: CHEST: Cardiovascular: No aortic injury. The thoracic aorta is normal in caliber. The heart is normal in size. No significant pericardial effusion. Severe atherosclerotic plaque. Aortic valve replacement. Four-vessel coronary calcification. Mediastinum/Nodes: Moderate volume pneumomediastinum.  No mediastinal hematoma. The esophagus is unremarkable. The thyroid is unremarkable. The central airways are patent. No mediastinal, hilar, or axillary lymphadenopathy. Lungs/Pleura: Left lower lobe pulmonary contusion. Left upper lobe surgical changes and scarring from wedge resection. No pulmonary nodule. No pulmonary mass. No definite pulmonary laceration. No pneumatocele formation. No pleural effusion. At least  small volume loculated left lower pneumothorax. No hemothorax. Musculoskeletal/Chest wall: Extensive subcutaneus soft tissue emphysema along the left chest wall, left back, bilateral neck. Acute full-shaft wdith displaced anterolateral left 4-8 rib fractures. Acute nondisplaced posterior left 5-6 rib fractures. No sternal fracture. No scapular fracture. No clavicular fracture. Intact sternotomy wires. No spinal fracture. ABDOMEN / PELVIS: Hepatobiliary: Not enlarged. No focal lesion. No laceration or subcapsular hematoma. The gallbladder is otherwise unremarkable with no radio-opaque gallstones. No biliary ductal dilatation. Pancreas: Normal pancreatic contour. No main pancreatic duct dilatation. Spleen: Not enlarged. No focal lesion. No laceration, subcapsular hematoma, or vascular injury. Adrenals/Urinary Tract: No nodularity bilaterally. Bilateral kidneys enhance symmetrically. No hydronephrosis. No contusion, laceration, or subcapsular hematoma. No injury to the vascular structures or collecting systems. No hydroureter. The urinary bladder is grossly unremarkable with limited evaluation due to streak artifact originating from bilateral femoral surgical hardware. On delayed imaging, there is no urothelial wall thickening and there are no filling defects in the opacified portions of the bilateral collecting systems or ureters. Stomach/Bowel: No small or large bowel wall thickening or dilatation. The appendix is unremarkable. Vasculature/Lymphatics: Severe atherosclerotic plaque. No abdominal aorta or iliac aneurysm. No active contrast extravasation or pseudoaneurysm. No abdominal, pelvic, inguinal lymphadenopathy. Reproductive: Coarsened uterine lesions suggestive of degenerative uterine fibroids. Uterus and bilateral adnexal regions are otherwise unremarkable. Slightly limited evaluation due to streak artifact originating from bilateral hip surgical hardware. Other: No simple free fluid ascites. No  pneumoperitoneum. No hemoperitoneum. No mesenteric hematoma identified. No organized fluid collection. Musculoskeletal: Left hip film flank subcutaneus soft tissue hematoma formation. Left chest subcutaneus soft tissue emphysema extends inferiorly along the left flank soft tissues and anterior abdominal soft tissue/groin. Multilevel degenerative changes spine with intervertebral disc space vacuum phenomenon and endplate sclerosis at the L2-L3 level. Grade 1 anterolisthesis of L4 on L5. No acute pelvic fracture. No spinal fracture. Partially visualized total bilateral hip arthroplasty.  No CT findings to suggest surgical hardware complication. Ports and Devices: None. IMPRESSION: 1. At least small volume loculated left lower pneumothorax. 2. Moderate volume pneumomediastinum. Finding likely due to trauma in a patient status post left upper lobe wedge resection and surgical changes. No definite other findings of esophageal perforation. 3. Left lower lobe pulmonary contusion. 4. Extensive subcutaneus soft tissue emphysema of the left anterior chest, abdomen, back, neck (bilateral neck). 5. Acute full-shaft width displaced left anterolateral 4-8 rib fractures. Acute nondisplaced posterior left 5-6 rib fractures. No flail chest. 6. Left hip film flank subcutaneus soft tissue hematoma formation. 7. No acute intra-abdominal or intrapelvic traumatic injury. 8. No acute fracture or traumatic malalignment of the thoracic or lumbar spine. 9. Other imaging findings of potential clinical significance: Aortic Atherosclerosis (ICD10-I70.0) including least 4 vessel coronary artery. Status post aortic valve replacement. Total bilateral hip arthroplasty partially visualized. These results were called by telephone at the time of interpretation on 04/14/2023 at 1:25 pm to provider [facility] , who verbally acknowledged these results. Electronically Signed   By: Tish Frederickson M.D.   On: 04/14/2023 13:41   CT Head Wo  Contrast  Result Date: 04/14/2023 CLINICAL DATA:  Head trauma, minor (Age >= 65y); Neck trauma (Age >= 65y). EXAM: CT HEAD WITHOUT CONTRAST CT CERVICAL SPINE WITHOUT CONTRAST TECHNIQUE: Multidetector CT imaging of the head and cervical spine was performed following the standard protocol without intravenous contrast. Multiplanar CT image reconstructions of the cervical spine were also generated. RADIATION DOSE REDUCTION: This exam was performed according to the departmental dose-optimization program which includes automated exposure control, adjustment of the mA and/or kV according to patient size and/or use of iterative reconstruction technique. COMPARISON:  None available. FINDINGS: CT HEAD FINDINGS Brain: No acute intracranial hemorrhage. Gray-white differentiation is preserved. No hydrocephalus or extra-axial collection. No mass effect or midline shift. Vascular: No hyperdense vessel or unexpected calcification. Skull: No calvarial fracture or suspicious bone lesion. Skull base is unremarkable. Sinuses/Orbits: No acute finding. Other: Left frontotemporal scalp hematoma. Subcutaneous emphysema dissecting along the deep fascial planes of the neck to the skull base. CT CERVICAL SPINE FINDINGS Alignment: Normal. Skull base and vertebrae: No acute fracture. Normal craniocervical junction. No suspicious bone lesions. Soft tissues and spinal canal: No prevertebral fluid or swelling. No visible canal hematoma. Disc levels: Multilevel cervical spondylosis, worst at C5-6, where there is at least mild spinal canal stenosis. Upper chest: Trace left apical pneumothorax, better evaluated on same day chest CT. Extensive pneumomediastinum and subcutaneous emphysema tracking along the deep fascial planes of the neck, left-greater-than-right. Other: Atherosclerotic calcifications of the carotid bulbs. IMPRESSION: 1. No acute intracranial abnormality. 2. Left frontotemporal scalp hematoma without underlying calvarial fracture. 3.  No acute cervical spine fracture or traumatic listhesis. 4. Trace left apical pneumothorax, better evaluated on same day chest CT. Extensive pneumomediastinum and subcutaneous emphysema tracking along the deep fascial planes of the neck, left-greater-than-right. Electronically Signed   By: Orvan Falconer M.D.   On: 04/14/2023 13:16   CT Cervical Spine Wo Contrast  Result Date: 04/14/2023 CLINICAL DATA:  Head trauma, minor (Age >= 65y); Neck trauma (Age >= 65y). EXAM: CT HEAD WITHOUT CONTRAST CT CERVICAL SPINE WITHOUT CONTRAST TECHNIQUE: Multidetector CT imaging of the head and cervical spine was performed following the standard protocol without intravenous contrast. Multiplanar CT image reconstructions of the cervical spine were also generated. RADIATION DOSE REDUCTION: This exam was performed according to the departmental dose-optimization program which includes automated exposure control, adjustment of the  mA and/or kV according to patient size and/or use of iterative reconstruction technique. COMPARISON:  None available. FINDINGS: CT HEAD FINDINGS Brain: No acute intracranial hemorrhage. Gray-white differentiation is preserved. No hydrocephalus or extra-axial collection. No mass effect or midline shift. Vascular: No hyperdense vessel or unexpected calcification. Skull: No calvarial fracture or suspicious bone lesion. Skull base is unremarkable. Sinuses/Orbits: No acute finding. Other: Left frontotemporal scalp hematoma. Subcutaneous emphysema dissecting along the deep fascial planes of the neck to the skull base. CT CERVICAL SPINE FINDINGS Alignment: Normal. Skull base and vertebrae: No acute fracture. Normal craniocervical junction. No suspicious bone lesions. Soft tissues and spinal canal: No prevertebral fluid or swelling. No visible canal hematoma. Disc levels: Multilevel cervical spondylosis, worst at C5-6, where there is at least mild spinal canal stenosis. Upper chest: Trace left apical pneumothorax,  better evaluated on same day chest CT. Extensive pneumomediastinum and subcutaneous emphysema tracking along the deep fascial planes of the neck, left-greater-than-right. Other: Atherosclerotic calcifications of the carotid bulbs. IMPRESSION: 1. No acute intracranial abnormality. 2. Left frontotemporal scalp hematoma without underlying calvarial fracture. 3. No acute cervical spine fracture or traumatic listhesis. 4. Trace left apical pneumothorax, better evaluated on same day chest CT. Extensive pneumomediastinum and subcutaneous emphysema tracking along the deep fascial planes of the neck, left-greater-than-right. Electronically Signed   By: Orvan Falconer M.D.   On: 04/14/2023 13:16   DG Chest Port 1 View  Result Date: 04/14/2023 CLINICAL DATA:  Status post fall EXAM: PORTABLE CHEST 1 VIEW COMPARISON:  Chest radiograph dated 09/29/2018 FINDINGS: Extensive left chest wall and left neck subcutaneous emphysema. Left basilar patchy and dense left retrocardiac opacity. Trace left pneumothorax. Similar postsurgical cardiomediastinal silhouette. Multiple displaced left rib fractures. Median sternotomy wires are nondisplaced. IMPRESSION: 1. Multiple displaced left rib fractures with trace left pneumothorax and extensive left chest wall and left neck subcutaneous emphysema. 2. Left basilar patchy and dense left retrocardiac opacity, may reflect a combination of atelectasis and pulmonary contusion. These results were called by telephone at the time of interpretation on 04/14/2023 at 11:52 am to provider [facility] , who verbally acknowledged these results. Electronically Signed   By: Agustin Cree M.D.   On: 04/14/2023 11:52    Assessment/Plan Present on Admission:  Fall at home, initial encounter -PT/OT consult once stabilized -C-spine imaging normal   Extensive pneumomediastinum   Rib fractures involving 4 or more ribs  Small loculated left lower pneumothorax  [place] emphysema d/t trauma involving chest,  abdomen, back, B/L neck  Left pulmonary contusion -Patient seen by surgery and IR -IR placed chest tube, 76F pigtail drain.  IR to manage -Surgery recommends medicine admission.  No surgical intervention needed.  Pain management.  Patient discussed with Dr. Mitzi Davenport who agrees with medicine admission -guaifenesin scheduled,  -incentive spirometer ordered -pain control as needed -PT/OT consult placed -On 5 L oxygen   AKI -IV fluid hydration, BMP in a.m. -Hold Cozaar   Hyponatremia, mild -BMP in a.m., IV fluids overnight   CAD w/ h/o PCI -Coreg resumed.  Losartan on hold. -On Effient because she is allergic to aspirin.  Held.   T2DM -well controlled. Sliding scale insulin ordered -Carbohydrate diet -Trulicity taken on Mondays   HLD -on Repatha   Depression -Zoloft resumed   Aortic stenosis, sp bioprosthetic AVR/CABG   ,  04/14/2023, 8:02 PM

## 2023-04-14 NOTE — Consult Note (Addendum)
Chief Complaint: Patient was seen in consultation today for chest tube placement  Chief Complaint  Patient presents with   Fall   at the request of Meridee Score EDP  Referring Physician(s):  Meridee Score EDP  Supervising Physician: Marliss Coots  Patient Status: [facility] ED  History of Present Illness: Sarah Stevenson is a 67 y.o. female with PMHs of MI, DM, CAD, LUL lung CA, OA, presented to ED after she fell last night, found to have left pneumothorax and moderate volume pneumomediastinum, IR was consulted for chest tube placement.   Patient came to [facility] due to fall last night, she does not remember the incident, she remembers waking up outside. In ED she was hemodynamically stable, CT head and neck showed no acute intracranial abnormality or cervical spine fracture or traumatic listhesis, CT CAP with showed:   1. At least small volume loculated left lower pneumothorax. 2. Moderate volume pneumomediastinum. Finding likely due to trauma in a patient status post left upper lobe wedge resection and surgical changes. No definite other findings of esophageal perforation. 3. Left lower lobe pulmonary contusion. 4. Extensive subcutaneus soft tissue emphysema of the left anterior chest, abdomen, back, neck (bilateral neck). 5. Acute full-shaft width displaced left anterolateral 4-8 rib fractures. Acute nondisplaced posterior left 5-6 rib fractures. No flail chest. 6. Left hip film flank subcutaneus soft tissue hematoma formation. 7. No acute intra-abdominal or intrapelvic traumatic injury. 8. No acute fracture or traumatic malalignment of the thoracic or lumbar spine. 9. Other imaging findings of potential clinical significance: Aortic Atherosclerosis (ICD10-I70.0) including least 4 vessel coronary artery. Status post aortic valve replacement. Total bilateral hip arthroplasty partially visualized.   IR was reached out by EDP for possible chest tube placement, case  reviewed and approved for CT guided left chest tube placement by Dr. Elby Showers. Patient was transferred to [facility] to [facility] ED, IR will proceed with chest tube placement.   Patient laying in bed, not in acute distress.  Reports left upper chest pain and SOB today. Had chills, HA, and nausea this morning.  Denise  fever, chills, cough,  abdominal pain, nausea ,vomiting, and bleeding. States that her lung surgery was 5 years ago.   Advance Care Plan: The advanced care plan/surrogate decision maker was discussed at the time of visit and documented in the medical record. Patient states that her care can be discussed with her son Mr. Christena Kershaw.   Past Medical History:  Diagnosis Date   Anginal pain (HCC)    Aortic stenosis    Coronary artery disease    Depression    "mild" (08/06/2016)   Dyspnea    Hyperlipidemia    lung ca dx'd 05/2018   Murmur, cardiac    Myocardial infarction [facility]) 2016   Osteoarthritis    "left hip and knee" (08/06/2016)   Type II diabetes mellitus (HCC)     Past Surgical History:  Procedure Laterality Date   AORTIC VALVE REPLACEMENT N/A 05/17/2018   Procedure: AORTIC VALVE REPLACEMENT (AVR) USING 21 MM MAGNA EASE PERICARDIAL BIOPROSTHESIS. MODEL # [id number]. SERIAL # [id number];  Surgeon: Delight Ovens, MD;  Location: [facility] OR;  Service: Open Heart Surgery;  Laterality: N/A;   CARDIAC CATHETERIZATION Right 11/30/2014   Procedure: RIGHT/LEFT HEART CATH AND CORONARY ANGIOGRAPHY;  Surgeon: Lyn Records, MD;  Location: [facility] CATH LAB;  Service: Cardiovascular;  Laterality: Right;   CARDIAC CATHETERIZATION N/A 12/13/2015   Procedure: Left Heart Cath and Coronary Angiography;  Surgeon: Marykay Lex,  MD;  Location: [facility] INVASIVE CV LAB;  Service: Cardiovascular;  Laterality: N/A;   CARDIAC CATHETERIZATION N/A 12/13/2015   Procedure: Intravascular Pressure Wire/FFR Study;  Surgeon: Marykay Lex, MD;  Location: [facility] INVASIVE CV LAB;  Service: Cardiovascular;  Laterality:  N/A;  RCA   CARDIAC CATHETERIZATION N/A 08/06/2016   Procedure: Right/Left Heart Cath and Coronary Angiography;  Surgeon: Runell Gess, MD;  Location: [facility] INVASIVE CV LAB;  Service: Cardiovascular;  Laterality: N/A;   CARDIAC CATHETERIZATION N/A 08/06/2016   Procedure: Coronary Stent Intervention;  Surgeon: Runell Gess, MD;  Location: [facility] INVASIVE CV LAB;  Service: Cardiovascular;  Laterality: N/A;   distal rca 3.0x16 and 3.0x8 synergy   CESAREAN SECTION  1989   CORONARY ANGIOPLASTY WITH STENT PLACEMENT  08/06/2016   "2 stents"   CORONARY ARTERY BYPASS GRAFT N/A 05/17/2018   Procedure: CORONARY ARTERY BYPASS GRAFTING (CABG) X 2 WITH ENDOSCOPIC HARVESTING OF RIGHT SAPHENOUS VEIN. LIMA TO LAD. SVG TO RCA;  Surgeon: Delight Ovens, MD;  Location: [facility] OR;  Service: Open Heart Surgery;  Laterality: N/A;   ETHMOIDECTOMY Right 07/28/2021   Procedure: ETHMOIDECTOMY;  Surgeon: Newman Pies, MD;  Location: [facility];  Service: ENT;  Laterality: Right;   FRONTAL SINUS EXPLORATION Right 07/28/2021   Procedure: FRONTAL SINUS EXPLORATION;  Surgeon: Newman Pies, MD;  Location: [facility];  Service: ENT;  Laterality: Right;   HYSTEROSCOPY DIAGNOSTIC  1990s   JOINT REPLACEMENT     KNEE ARTHROSCOPY Left 1996   MAXILLARY ANTROSTOMY Right 07/28/2021   Procedure: MAXILLARY ANTROSTOMY WITH TISSUE REMOVAL;  Surgeon: Newman Pies, MD;  Location: [facility];  Service: ENT;  Laterality: Right;   NASAL SINUS SURGERY  1977   NASAL SINUS SURGERY Right 07/28/2021   Procedure: ENDOSCOPIC SINUS SURGERY WITH STEALTH NAVIGATION;  Surgeon: Newman Pies, MD;  Location: [facility];  Service: ENT;  Laterality: Right;   RIGHT/LEFT HEART CATH AND CORONARY ANGIOGRAPHY N/A 04/01/2018   Procedure: RIGHT/LEFT HEART CATH AND CORONARY ANGIOGRAPHY;  Surgeon: Lyn Records, MD;  Location: [facility] INVASIVE CV LAB;  Service: Cardiovascular;  Laterality: N/A;   TEE WITHOUT CARDIOVERSION N/A  05/17/2018   Procedure: TRANSESOPHAGEAL ECHOCARDIOGRAM (TEE);  Surgeon: Delight Ovens, MD;  Location: [facility] OR;  Service: Open Heart Surgery;  Laterality: N/A;   TOTAL HIP ARTHROPLASTY Left 04/23/2017   Procedure: LEFT TOTAL HIP ARTHROPLASTY ANTERIOR APPROACH;  Surgeon: Kathryne Hitch, MD;  Location: [facility] ORS;  Service: Orthopedics;  Laterality: Left;   TOTAL HIP ARTHROPLASTY Right 10/17/2021   Procedure: TOTAL HIP ARTHROPLASTY ANTERIOR APPROACH;  Surgeon: Kathryne Hitch, MD;  Location: [facility] ORS;  Service: Orthopedics;  Laterality: Right;   WEDGE RESECTION Left 05/17/2018   Procedure: LEFT UPPER LOBE LUNG WEDGE RESECTION;  Surgeon: Delight Ovens, MD;  Location: [facility] OR;  Service: Open Heart Surgery;  Laterality: Left;    Allergies: Brilinta [ticagrelor], Lisinopril, Asa [aspirin], Blue dyes (parenteral), Corn-containing products, Nsaids, Tea, Rosuvastatin, Zetia [ezetimibe], Isosulfan blue, and Other  Medications: Prior to Admission medications   Medication Sig Start Date End Date Taking? Authorizing Provider  Acetaminophen (TYLENOL DISSOLVE PACKS) 500 MG PACK Take 1,000 mg by mouth every 6 (six) hours as needed (pain).    [provider]  acetaminophen (TYLENOL) 325 MG tablet Take 650 mg by mouth every 6 (six) hours as needed for moderate pain.    [provider]  amoxicillin (AMOXIL) 500 MG capsule Take 4 capsules by mouth 1  hour before appointment 01/22/22     carvedilol (COREG) 6.25 MG tablet Take 1 tablet (6.25 mg total) by mouth 2 (two) times daily with a meal. 06/16/22   Dyann Kief, PA-C  celecoxib (CELEBREX) 200 MG capsule Take 1 capsule (200 mg total) by mouth daily as needed. 03/08/23     celecoxib (CELEBREX) 200 MG capsule Take 1 capsule (200 mg total) by mouth daily as needed for arthritis pain. 03/30/23     cetirizine (ZYRTEC) 10 MG tablet Take 10 mg by mouth daily.    [provider]  COLLAGEN PO Take 1 Scoop by mouth daily. With  biotin and vitamin c    [provider]  Dulaglutide (TRULICITY) 1.5 MG/0.5ML SOPN Inject 1.5 mg into the skin once a week. 03/08/23     Evolocumab (REPATHA SURECLICK) 140 MG/ML SOAJ INJECT 140 MG INTO THE SKIN EVERY 14 DAYS 12/17/21   Lyn Records, MD  famotidine (PEPCID) 20 MG tablet Take 20 mg by mouth at bedtime.    [provider]  fluticasone (FLONASE) 50 MCG/ACT nasal spray Place 1 spray into both nostrils daily.    [provider]  glipiZIDE (GLUCOTROL XL) 10 MG 24 hr tablet TAKE 1 TABLET BY MOUTH DAILY FOR DIABETES CONTROL. 03/15/22     glipiZIDE (GLUCOTROL XL) 5 MG 24 hr tablet Take 1 tablet (5 mg total) by mouth daily for diabetes. 03/30/23     glucose blood (ONETOUCH ULTRA) test strip TEST BLOOD SUGAR ONCE A DAY 12/11/17   [provider]  losartan (COZAAR) 50 MG tablet TAKE 1 TABLET BY MOUTH DAILY TO PRESERVE KIDNEY FUNCTION. 06/16/22   Dyann Kief, PA-C  losartan (COZAAR) 50 MG tablet Take 1 tablet (50 mg total) by mouth daily to preserve kidney function. 03/30/23     Menthol-Methyl Salicylate (MUSCLE RUB EX) Apply 1 application topically as needed (for pain).    [provider]  metFORMIN (GLUCOPHAGE) 850 MG tablet Take 1 tablet (850 mg total) by mouth 2 (two) times daily to control diabetes. 03/08/23     methocarbamol (ROBAXIN) 500 MG tablet Take one tablet 4 times a day as needed for muscle 03/05/22     montelukast (SINGULAIR) 10 MG tablet Take 1 tablet by mouth daily for allergy control 03/05/22     montelukast (SINGULAIR) 10 MG tablet Take 1 tablet (10 mg total) by mouth daily for allergy control. 09/10/22     montelukast (SINGULAIR) 10 MG tablet Take 1 tablet (10 mg total) by mouth daily for allergy control. 03/30/23     prasugrel (EFFIENT) 10 MG TABS tablet Take 1 tablet (10 mg total) by mouth daily. 07/22/22   Lyn Records, MD  Propylene Glycol (SYSTANE COMPLETE) 0.6 % SOLN Place 2 drops into both eyes daily.    [provider]   sertraline (ZOLOFT) 50 MG tablet Take 1 tablet (50 mg total) by mouth daily to control anxiety. 09/10/22        Family History  Problem Relation Age of Onset   Thyroid disease Mother    Heart attack Mother    Dementia Mother    Heart attack Father    Sudden death Father    Breast cancer Maternal Aunt    Hypertension Neg Hx    Hyperlipidemia Neg Hx    Diabetes Neg Hx     Social History   Socioeconomic History   Marital status: Divorced    Spouse name: Not on file   Number of children:  Not on file   Years of education: Not on file   Highest education level: Bachelor's degree (e.g., BA, AB, BS)  Occupational History   Not on file  Tobacco Use   Smoking status: Former    Current packs/day: 0.00    Average packs/day: 1 pack/day for 10.0 years (10.0 ttl pk-yrs)    Types: Cigarettes    Start date: 2003    Quit date: 2013    Years since quitting: 11.6   Smokeless tobacco: Never   Tobacco comments:    restarted  about 5-6 yrs ago, then stopped  Vaping Use   Vaping status: Former  Substance and Sexual Activity   Alcohol use: Not Currently    Comment: Rare   Drug use: No   Sexual activity: Never  Other Topics Concern   Not on file  Social History Narrative   Not on file   Social Determinants of Health   Financial Resource Strain: Low Risk  (03/03/2022)   [facility] visits prior to 11/07/2022[facility]   Overall Financial Resource Strain (CARDIA)    Difficulty of Paying Living Expenses: Not hard at all  Food Insecurity: Low Risk  (03/30/2023)   [facility] from [facility]   Food vital sign    Within the past 12 months, you worried that your food would run out before you got money to buy more: Never true    Within the past 12 months, the food you bought just didn't last and you didn't have money to get more. : Never true  Transportation Needs: Not on file  (03/30/2023)  Physical Activity: Sufficiently Active (03/03/2022)   [facility] to [facility]/07/[facility]22[facility] visits prior to 11/07/2022., [facility]   Exercise Vital Sign    Days of Exercise per Week: 5 days    Minutes of Exercise per Session: 60 min  Stress: No Stress Concern Present (03/03/2022)   [facility]07[facility] visits prior to 11/07/2022., [facility]   [employer] of Occupational Health - Occupational Stress Questionnaire    Feeling of Stress : Only a little  Social Connections: Moderately Integrated (03/03/2022)   [facility] from [facility] visits prior to 11/07/2022[facility], [facility] visits prior to 11/07/2022., [facility]   Social Connection and Isolation Panel [NHANES]    Frequency of Communication with Friends and Family: More than three times a week    Frequency of Social Gatherings with Friends and Family: Three times a week    Attends Religious Services: More than 4 times per year    Active Member of Clubs or Organizations: Yes    Attends Banker Meetings: More than 4 times per year    Marital Status: Divorced     Review of Systems: A 12 point ROS discussed and pertinent positives are indicated in the HPI above.  All other systems are negative.  Vital Signs: BP 130/61   Pulse 78   Temp 98 F (36.7 C) (Oral)   Resp (!) 22   Ht 5\' 2"  (1.575 m)   Wt 182 lb (82.6 kg)   SpO2 96%   BMI 33.29 kg/m    Physical Exam Vitals reviewed.  Constitutional:  General: She is not in acute distress.    Appearance: She is not ill-appearing.  HENT:     Head: Normocephalic and atraumatic.     Mouth/Throat:     Mouth: Mucous membranes are moist.     Pharynx: Oropharynx is clear.  Cardiovascular:     Rate and Rhythm:  Normal rate and regular rhythm.     Heart sounds: Normal heart sounds.  Pulmonary:     Effort: Pulmonary effort is normal.     Comments: Breath sound is diminishes in all lobes Abdominal:     General: Abdomen is flat. Bowel sounds are normal.     Palpations: Abdomen is soft.  Musculoskeletal:     Cervical back: Neck supple.  Skin:    General: Skin is warm and dry.     Coloration: Skin is not jaundiced or pale.  Neurological:     Mental Status: She is alert and oriented to person, place, and time.  Psychiatric:        Mood and Affect: Mood normal.        Behavior: Behavior normal.        Judgment: Judgment normal.     MD Evaluation Airway: WNL Heart: WNL Abdomen: WNL Chest/ Lungs: WNL ASA  Classification: 3 Mallampati/Airway Score: Two  Imaging: CT CHEST ABDOMEN PELVIS W CONTRAST  Result Date: 04/14/2023 CLINICAL DATA:  Polytrauma, blunt. Fall. Prior left upper lobe malignancy. EXAM: CT CHEST, ABDOMEN, AND PELVIS WITH CONTRAST TECHNIQUE: Multidetector CT imaging of the chest, abdomen and pelvis was performed following the standard protocol during bolus administration of intravenous contrast. RADIATION DOSE REDUCTION: This exam was performed according to the departmental dose-optimization program which includes automated exposure control, adjustment of the mA and/or kV according to patient size and/or use of iterative reconstruction technique. CONTRAST:  OMNIPAQUE IOHEXOL 300 MG/ML  SOLN COMPARISON:  Chest x-ray 04/14/2023, pet CT 06/29/2018 FINDINGS: CHEST: Cardiovascular: No aortic injury. The thoracic aorta is normal in caliber. The heart is normal in size. No significant pericardial effusion. Severe atherosclerotic plaque. Aortic valve replacement. Four-vessel coronary calcification. Mediastinum/Nodes: Moderate volume pneumomediastinum.  No mediastinal hematoma. The esophagus is unremarkable. The thyroid is unremarkable. The central airways are patent. No mediastinal, hilar,  or axillary lymphadenopathy. Lungs/Pleura: Left lower lobe pulmonary contusion. Left upper lobe surgical changes and scarring from wedge resection. No pulmonary nodule. No pulmonary mass. No definite pulmonary laceration. No pneumatocele formation. No pleural effusion. At least small volume loculated left lower pneumothorax. No hemothorax. Musculoskeletal/Chest wall: Extensive subcutaneus soft tissue emphysema along the left chest wall, left back, bilateral neck. Acute full-shaft wdith displaced anterolateral left 4-8 rib fractures. Acute nondisplaced posterior left 5-6 rib fractures. No sternal fracture. No scapular fracture. No clavicular fracture. Intact sternotomy wires. No spinal fracture. ABDOMEN / PELVIS: Hepatobiliary: Not enlarged. No focal lesion. No laceration or subcapsular hematoma. The gallbladder is otherwise unremarkable with no radio-opaque gallstones. No biliary ductal dilatation. Pancreas: Normal pancreatic contour. No main pancreatic duct dilatation. Spleen: Not enlarged. No focal lesion. No laceration, subcapsular hematoma, or vascular injury. Adrenals/Urinary Tract: No nodularity bilaterally. Bilateral kidneys enhance symmetrically. No hydronephrosis. No contusion, laceration, or subcapsular hematoma. No injury to the vascular structures or collecting systems. No hydroureter. The urinary bladder is grossly unremarkable with limited evaluation due to streak artifact originating from bilateral femoral surgical hardware. On delayed imaging, there is no urothelial wall thickening and there are no filling defects in the opacified portions of the bilateral collecting systems or ureters. Stomach/Bowel: No small or  large bowel wall thickening or dilatation. The appendix is unremarkable. Vasculature/Lymphatics: Severe atherosclerotic plaque. No abdominal aorta or iliac aneurysm. No active contrast extravasation or pseudoaneurysm. No abdominal, pelvic, inguinal lymphadenopathy. Reproductive: Coarsened  uterine lesions suggestive of degenerative uterine fibroids. Uterus and bilateral adnexal regions are otherwise unremarkable. Slightly limited evaluation due to streak artifact originating from bilateral hip surgical hardware. Other: No simple free fluid ascites. No pneumoperitoneum. No hemoperitoneum. No mesenteric hematoma identified. No organized fluid collection. Musculoskeletal: Left hip film flank subcutaneus soft tissue hematoma formation. Left chest subcutaneus soft tissue emphysema extends inferiorly along the left flank soft tissues and anterior abdominal soft tissue/groin. Multilevel degenerative changes spine with intervertebral disc space vacuum phenomenon and endplate sclerosis at the L2-L3 level. Grade 1 anterolisthesis of L4 on L5. No acute pelvic fracture. No spinal fracture. Partially visualized total bilateral hip arthroplasty. No CT findings to suggest surgical hardware complication. Ports and Devices: None. IMPRESSION: 1. At least small volume loculated left lower pneumothorax. 2. Moderate volume pneumomediastinum. Finding likely due to trauma in a patient status post left upper lobe wedge resection and surgical changes. No definite other findings of esophageal perforation. 3. Left lower lobe pulmonary contusion. 4. Extensive subcutaneus soft tissue emphysema of the left anterior chest, abdomen, back, neck (bilateral neck). 5. Acute full-shaft width displaced left anterolateral 4-8 rib fractures. Acute nondisplaced posterior left 5-6 rib fractures. No flail chest. 6. Left hip film flank subcutaneus soft tissue hematoma formation. 7. No acute intra-abdominal or intrapelvic traumatic injury. 8. No acute fracture or traumatic malalignment of the thoracic or lumbar spine. 9. Other imaging findings of potential clinical significance: Aortic Atherosclerosis (ICD10-I70.0) including least 4 vessel coronary artery. Status post aortic valve replacement. Total bilateral hip arthroplasty partially  visualized. These results were called by telephone at the time of interpretation on 04/14/2023 at 1:25 pm to provider [facility] , who verbally acknowledged these results. Electronically Signed   By: Tish Frederickson M.D.   On: 04/14/2023 13:41   CT Head Wo Contrast  Result Date: 04/14/2023 CLINICAL DATA:  Head trauma, minor (Age >= 65y); Neck trauma (Age >= 65y). EXAM: CT HEAD WITHOUT CONTRAST CT CERVICAL SPINE WITHOUT CONTRAST TECHNIQUE: Multidetector CT imaging of the head and cervical spine was performed following the standard protocol without intravenous contrast. Multiplanar CT image reconstructions of the cervical spine were also generated. RADIATION DOSE REDUCTION: This exam was performed according to the departmental dose-optimization program which includes automated exposure control, adjustment of the mA and/or kV according to patient size and/or use of iterative reconstruction technique. COMPARISON:  None available. FINDINGS: CT HEAD FINDINGS Brain: No acute intracranial hemorrhage. Gray-white differentiation is preserved. No hydrocephalus or extra-axial collection. No mass effect or midline shift. Vascular: No hyperdense vessel or unexpected calcification. Skull: No calvarial fracture or suspicious bone lesion. Skull base is unremarkable. Sinuses/Orbits: No acute finding. Other: Left frontotemporal scalp hematoma. Subcutaneous emphysema dissecting along the deep fascial planes of the neck to the skull base. CT CERVICAL SPINE FINDINGS Alignment: Normal. Skull base and vertebrae: No acute fracture. Normal craniocervical junction. No suspicious bone lesions. Soft tissues and spinal canal: No prevertebral fluid or swelling. No visible canal hematoma. Disc levels: Multilevel cervical spondylosis, worst at C5-6, where there is at least mild spinal canal stenosis. Upper chest: Trace left apical pneumothorax, better evaluated on same day chest CT. Extensive pneumomediastinum and subcutaneous emphysema  tracking along the deep fascial planes of the neck, left-greater-than-right. Other: Atherosclerotic calcifications of the carotid bulbs. IMPRESSION: 1. No acute intracranial  abnormality. 2. Left frontotemporal scalp hematoma without underlying calvarial fracture. 3. No acute cervical spine fracture or traumatic listhesis. 4. Trace left apical pneumothorax, better evaluated on same day chest CT. Extensive pneumomediastinum and subcutaneous emphysema tracking along the deep fascial planes of the neck, left-greater-than-right. Electronically Signed   By: Orvan Falconer M.D.   On: 04/14/2023 13:16   CT Cervical Spine Wo Contrast  Result Date: 04/14/2023 CLINICAL DATA:  Head trauma, minor (Age >= 65y); Neck trauma (Age >= 65y). EXAM: CT HEAD WITHOUT CONTRAST CT CERVICAL SPINE WITHOUT CONTRAST TECHNIQUE: Multidetector CT imaging of the head and cervical spine was performed following the standard protocol without intravenous contrast. Multiplanar CT image reconstructions of the cervical spine were also generated. RADIATION DOSE REDUCTION: This exam was performed according to the departmental dose-optimization program which includes automated exposure control, adjustment of the mA and/or kV according to patient size and/or use of iterative reconstruction technique. COMPARISON:  None available. FINDINGS: CT HEAD FINDINGS Brain: No acute intracranial hemorrhage. Gray-white differentiation is preserved. No hydrocephalus or extra-axial collection. No mass effect or midline shift. Vascular: No hyperdense vessel or unexpected calcification. Skull: No calvarial fracture or suspicious bone lesion. Skull base is unremarkable. Sinuses/Orbits: No acute finding. Other: Left frontotemporal scalp hematoma. Subcutaneous emphysema dissecting along the deep fascial planes of the neck to the skull base. CT CERVICAL SPINE FINDINGS Alignment: Normal. Skull base and vertebrae: No acute fracture. Normal craniocervical junction. No suspicious  bone lesions. Soft tissues and spinal canal: No prevertebral fluid or swelling. No visible canal hematoma. Disc levels: Multilevel cervical spondylosis, worst at C5-6, where there is at least mild spinal canal stenosis. Upper chest: Trace left apical pneumothorax, better evaluated on same day chest CT. Extensive pneumomediastinum and subcutaneous emphysema tracking along the deep fascial planes of the neck, left-greater-than-right. Other: Atherosclerotic calcifications of the carotid bulbs. IMPRESSION: 1. No acute intracranial abnormality. 2. Left frontotemporal scalp hematoma without underlying calvarial fracture. 3. No acute cervical spine fracture or traumatic listhesis. 4. Trace left apical pneumothorax, better evaluated on same day chest CT. Extensive pneumomediastinum and subcutaneous emphysema tracking along the deep fascial planes of the neck, left-greater-than-right. Electronically Signed   By: Orvan Falconer M.D.   On: 04/14/2023 13:16   DG Chest Port 1 View  Result Date: 04/14/2023 CLINICAL DATA:  Status post fall EXAM: PORTABLE CHEST 1 VIEW COMPARISON:  Chest radiograph dated 09/29/2018 FINDINGS: Extensive left chest wall and left neck subcutaneous emphysema. Left basilar patchy and dense left retrocardiac opacity. Trace left pneumothorax. Similar postsurgical cardiomediastinal silhouette. Multiple displaced left rib fractures. Median sternotomy wires are nondisplaced. IMPRESSION: 1. Multiple displaced left rib fractures with trace left pneumothorax and extensive left chest wall and left neck subcutaneous emphysema. 2. Left basilar patchy and dense left retrocardiac opacity, may reflect a combination of atelectasis and pulmonary contusion. These results were called by telephone at the time of interpretation on 04/14/2023 at 11:52 am to provider [facility] , who verbally acknowledged these results. Electronically Signed   By: Agustin Cree M.D.   On: 04/14/2023 11:52    Labs:  CBC: Recent Labs     08/06/22 0927 04/14/23 1020  WBC 11.8* 12.9*  HGB 14.2 12.8  HCT 42.8 37.7  PLT 227 205    COAGS: Recent Labs    04/14/23 1020  INR 1.1    BMP: Recent Labs    08/06/22 0927 04/14/23 1020  NA 139 131*  K 4.2 4.3  CL 103 96*  CO2 30 23  GLUCOSE 155* 193*  BUN 15 31*  CALCIUM 9.6 8.7*  CREATININE 0.98 1.25*  GFRNONAA >60 48*    LIVER FUNCTION TESTS: Recent Labs    08/06/22 0927 04/14/23 1020  BILITOT 0.7 1.3*  AST 11* 27  ALT 13 21  ALKPHOS 85 60  PROT 7.7 7.1  ALBUMIN 4.3 3.9    TUMOR MARKERS: No results for input(s): "AFPTM", "CEA", "CA199", "CHROMGRNA" in the last 8760 hours.  Assessment and Plan: 67 y.o. female with LUL lung CA, fall last night, left PTX and pneumomediastinum seen in CT today presents for chest tube placement.   NPO since 9 am today  VSS CBC with mild leukocytosis 12.9, wnl otherwise  INR 1.1  11 allergies, not allergic to lidocaine, Fentanyl, or Versed Not on AC/AP  Risks and benefits of chest tube placement were discussed with the patient including bleeding, infection, damage to adjacent structures, malfunction of the tube requiring additional procedures and sepsis.  All of the patient's questions were answered, patient is agreeable to proceed. Consent signed and in chart.   Thank you for this interesting consult.  I greatly enjoyed meeting AVIAN OHMER and look forward to participating in their care.  A copy of this report was sent to the requesting provider on this date.  Electronically Signed: Willette Brace, PA-C 04/14/2023, 3:31 PM   I spent a total of 20 Minutes    in face to face in clinical consultation, greater than 50% of which was counseling/coordinating care for Left chest tube placement.   This chart was dictated using voice recognition software.  Despite best efforts to proofread,  errors can occur which can change the documentation meaning.

## 2023-04-15 ENCOUNTER — Inpatient Hospital Stay (HOSPITAL_COMMUNITY): Payer: PPO

## 2023-04-15 ENCOUNTER — Encounter (HOSPITAL_COMMUNITY): Payer: Self-pay | Admitting: Family Medicine

## 2023-04-15 DIAGNOSIS — S2249XA Multiple fractures of ribs, unspecified side, initial encounter for closed fracture: Secondary | ICD-10-CM | POA: Diagnosis not present

## 2023-04-15 LAB — GLUCOSE, CAPILLARY: Glucose-Capillary: 155 mg/dL — ABNORMAL HIGH (ref 70–99)

## 2023-04-15 LAB — CBG MONITORING, ED
Glucose-Capillary: 126 mg/dL — ABNORMAL HIGH (ref 70–99)
Glucose-Capillary: 139 mg/dL — ABNORMAL HIGH (ref 70–99)

## 2023-04-15 MED ORDER — SENNOSIDES-DOCUSATE SODIUM 8.6-50 MG PO TABS
1.0000 | ORAL_TABLET | Freq: Every evening | ORAL | Status: DC | PRN
  Administered 2023-04-16: 1 via ORAL
  Filled 2023-04-15: qty 1

## 2023-04-15 MED ORDER — ONDANSETRON HCL 4 MG/2ML IJ SOLN: 4.0000 mg | Freq: Four times a day (QID) | INTRAMUSCULAR | Status: DC | PRN

## 2023-04-15 MED ORDER — ACETAMINOPHEN 325 MG PO TABS: 650.0000 mg | ORAL_TABLET | Freq: Four times a day (QID) | ORAL | Status: DC | PRN

## 2023-04-15 MED ORDER — ONDANSETRON HCL 4 MG PO TABS: 4.0000 mg | ORAL_TABLET | Freq: Four times a day (QID) | ORAL | Status: DC | PRN

## 2023-04-15 MED ORDER — HEPARIN SODIUM (PORCINE) 5000 UNIT/ML IJ SOLN
5000.0000 [IU] | Freq: Three times a day (TID) | INTRAMUSCULAR | Status: DC
  Administered 2023-04-15 – 2023-04-17 (×7): 5000 [IU] via SUBCUTANEOUS
  Filled 2023-04-15 (×7): qty 1

## 2023-04-15 MED ORDER — ACETAMINOPHEN 650 MG RE SUPP: 650.0000 mg | Freq: Four times a day (QID) | RECTAL | Status: DC | PRN

## 2023-04-15 NOTE — ED Notes (Signed)
Got patient up to the bedside toilet patient did well patient is now back in bed on the monitor with call bell in reach

## 2023-04-15 NOTE — ED Notes (Signed)
Checked patient cbg it was 84 notified RN of blood sugar patient is sitting eating breakfast has call bell in reach and bed rails up

## 2023-04-15 NOTE — Progress Notes (Signed)
PROGRESS NOTE    Sarah Stevenson  GNF:[id number] DOB: Aug 03, 1956 DOA: 04/14/2023 PCP: Barbie Banner, MD     Brief Narrative:  Sarah Stevenson is a 67 year old female with past medical history of CAD sp CABG, AS sp AVR, HLD, depression, T2DM, history of lung cancer.  Patient has a dog that she states wakes her up at times of night.  She believes this is what happened, resulting in her falling while going down steps on her deck.  Patient hit her head, suffered LOC. She woke at approximately 10 AM yesterday morning.  She does not recall the events from the night.  She had left-sided chest pain that persisted and worsened.  She endorses shortness of breath.  Today she called her daughter-in-law who brought her to the ER.  Patient maintained on Effient, her last dose was Monday.   In the ER patient's vitals were stable afebrile, HR 76.  Imaging showed to scalp hematoma, approximately 5 left-sided rib fractures, pneumomediastinum, small loculated pneumothorax, along with other findings.  Pigtail catheter placed for pneumothorax by IR.  Trauma service following.  New events last 24 hours / Subjective: Patient seen in the emergency department.  She is feeling well overall, continues to have pain in her left chest.  Assessment & Plan:   Principal Problem:   Multiple rib fractures involving four or more ribs Active Problems:   Hyperlipidemia   Left pulmonary contusion   Subcutaneous emphysema due to trauma, initial encounter (HCC)   Fall at home, initial encounter   Pneumomediastinum (HCC)   Pneumothorax   AKI (acute kidney injury) (HCC)   Hyponatremia   T2DM (type 2 diabetes mellitus) (HCC)   Fall with injury Extensive pneumomediastinum Multiple left-sided rib fractures Left lower pneumothorax -Pigtail catheter placed by IR, IR managing -Trauma surgery following -Incentive spirometry -PT OT  AKI, ruled out -Baseline creatinine 0.98  CAD -Coreg -Effient on hold  Diabetes  mellitus, well-controlled -A1c 5.9 -Sliding scale insulin  Hyperlipidemia -Repatha PTA   Depression -Zoloft  Aortic stenosis -Status post bioprosthetic AVR/CABG  Hyponatremia -Resolved   DVT prophylaxis:  heparin injection 5,000 Units Start: 04/15/23 0600  Code Status: Full code Family Communication: None at bedside Disposition Plan: Home Status is: Inpatient Remains inpatient appropriate because: Chest tube in place    Antimicrobials:  Anti-infectives (From admission, onward)    None        Objective: Vitals:   04/15/23 1030 04/15/23 1121 04/15/23 1345 04/15/23 1409  BP: (!) 128/56  122/65 (!) 151/62  Pulse: 65  69 74  Resp: (!) 21  (!) 24 18  Temp:  98.2 F (36.8 C)  (!) 97.4 F (36.3 C)  TempSrc:  Oral  Oral  SpO2: 97%  93% 91%  Weight:      Height:        Intake/Output Summary (Last 24 hours) at 04/15/2023 1421 Last data filed at 04/15/2023 0650 Gross per 24 hour  Intake --  Output 100 ml  Net -100 ml   Filed Weights   04/14/23 1005  Weight: 82.6 kg    Examination:  General exam: Appears calm and comfortable  Respiratory system: Clear to auscultation. Respiratory effort normal. No respiratory distress. No conversational dyspnea.  On nasal cannula O2.  Chest tube in place Cardiovascular system: S1 & S2 heard, RRR. No murmurs. No pedal edema. Gastrointestinal system: Abdomen is nondistended, soft and nontender. Normal bowel sounds heard. Central nervous system: Alert and oriented. No focal neurological deficits. Speech clear.  Extremities: Symmetric in appearance  Skin: No rashes, lesions or ulcers on exposed skin  Psychiatry: Judgement and insight appear normal. Mood & affect appropriate.   Data Reviewed: I have personally reviewed following labs and imaging studies  CBC: Recent Labs  Lab 04/14/23 1020 04/15/23 0227  WBC 12.9* 9.1  NEUTROABS 9.8*  --   HGB 12.8 12.4  HCT 37.7 38.2  MCV 91.3 94.6  PLT 205 177   Basic Metabolic  Panel: Recent Labs  Lab 04/14/23 1020 04/15/23 0227  NA 131* 136  K 4.3 4.1  CL 96* 103  CO2 23 24  GLUCOSE 193* 165*  BUN 31* 29*  CREATININE 1.25* 1.20*  CALCIUM 8.7* 8.3*   GFR: Estimated Creatinine Clearance: 45.9 mL/min (A) (by C-G formula based on SCr of 1.2 mg/dL (H)). Liver Function Tests: Recent Labs  Lab 04/14/23 1020  AST 27  ALT 21  ALKPHOS 60  BILITOT 1.3*  PROT 7.1  ALBUMIN 3.9   No results for input(s): "LIPASE", "AMYLASE" in the last 168 hours. No results for input(s): "AMMONIA" in the last 168 hours. Coagulation Profile: Recent Labs  Lab 04/14/23 1020  INR 1.1   Cardiac Enzymes: No results for input(s): "CKTOTAL", "CKMB", "CKMBINDEX", "TROPONINI" in the last 168 hours. BNP (last 3 results) No results for input(s): "PROBNP" in the last 8760 hours. HbA1C: Recent Labs    04/15/23 0227  HGBA1C 5.9*   CBG: Recent Labs  Lab 04/14/23 1523 04/14/23 1736 04/14/23 2118 04/15/23 0821 04/15/23 1140  GLUCAP 121* 86 139* 126* 139*   Lipid Profile: No results for input(s): "CHOL", "HDL", "LDLCALC", "TRIG", "CHOLHDL", "LDLDIRECT" in the last 72 hours. Thyroid Function Tests: No results for input(s): "TSH", "T4TOTAL", "FREET4", "T3FREE", "THYROIDAB" in the last 72 hours. Anemia Panel: No results for input(s): "VITAMINB12", "FOLATE", "FERRITIN", "TIBC", "IRON", "RETICCTPCT" in the last 72 hours. Sepsis Labs: No results for input(s): "PROCALCITON", "LATICACIDVEN" in the last 168 hours.  No results found for this or any previous visit (from the past 240 hour(s)).    Radiology Studies: CT GUIDED VISCERAL FLUID DRAIN BY PERC CATH  Result Date: 04/15/2023 INDICATION: 67 year old female referred for left chest tube placement for pneumothorax. EXAM: CT GUIDED LEFT CHEST TUBE PLACEMENT MEDICATIONS: None ANESTHESIA/SEDATION: 1.0 mg IV Versed 50 mcg IV Fentanyl Moderate Sedation Time:  27 minute The patient was continuously monitored during the procedure by  the interventional radiology nurse under my direct supervision. COMPLICATIONS: Six none TECHNIQUE: Informed written consent was obtained from the patient after a thorough discussion of the procedural risks, benefits and alternatives. All questions were addressed. Maximal Sterile Barrier Technique was utilized including caps, mask, sterile gowns, sterile gloves, sterile drape, hand hygiene and skin antiseptic. A timeout was performed prior to the initiation of the procedure. PROCEDURE: Patient was positioned supine on the CT gantry table. Left arm was extended above the head. Scout images were acquired. The left chest wall was prepped with chlorhexidine in a sterile fashion, and a sterile drape was applied covering the operative field. A sterile gown and sterile gloves were used for the procedure. Local anesthesia was provided with 1% Lidocaine. Once the patient was anesthetized, a trocar needle was advanced with CT guidance into the pneumothorax at the base of the left chest. Once we confirmed needle tip position modified Seldinger technique was used to place a 10 [place] drain. Drain was attached to the pleura vac unit to confirm function and a final CT was acquired. Patient tolerated the procedure well and remained  hemodynamically stable throughout. No complications were encountered and no significant blood loss. FINDINGS: Ten French drain into the left chest for treatment pneumothorax. A chest tube function was confirmed with improved pneumothorax component after placement. IMPRESSION: Status post CT-guided left chest tube placement. Signed, Yvone Neu. Miachel Roux, RPVI [facility] Electronically Signed   By: Gilmer Mor D.O.   On: 04/15/2023 12:26   DG Chest Port 1 View  Result Date: 04/15/2023 CLINICAL DATA:  Pneumothorax.  Chest tube placed yesterday. EXAM: PORTABLE CHEST 1 VIEW COMPARISON:  One-view chest x-ray 04/14/2023 FINDINGS: The heart is  enlarged. Median sternotomy noted. Aortic valve replacement is present. Atherosclerotic changes are present at the aortic arch. A left-sided chest tube is in place. A tiny apical pneumothorax is present. Subcutaneous emphysema is present over the left chest wall and neck. Increasing left pleural effusion and basilar airspace opacity is present. The right lung is clear. Lung volumes are low. IMPRESSION: 1. Tiny left apical pneumothorax with left-sided chest tube in place. 2. Increasing left pleural effusion and basilar airspace opacity likely reflects atelectasis. Infection is not excluded. 3. Cardiomegaly without failure. Electronically Signed   By: Marin Roberts M.D.   On: 04/15/2023 08:26   CT CHEST ABDOMEN PELVIS W CONTRAST  Result Date: 04/14/2023 CLINICAL DATA:  Polytrauma, blunt. Fall. Prior left upper lobe malignancy. EXAM: CT CHEST, ABDOMEN, AND PELVIS WITH CONTRAST TECHNIQUE: Multidetector CT imaging of the chest, abdomen and pelvis was performed following the standard protocol during bolus administration of intravenous contrast. RADIATION DOSE REDUCTION: This exam was performed according to the departmental dose-optimization program which includes automated exposure control, adjustment of the mA and/or kV according to patient size and/or use of iterative reconstruction technique. CONTRAST:  OMNIPAQUE IOHEXOL 300 MG/ML  SOLN COMPARISON:  Chest x-ray 04/14/2023, pet CT 06/29/2018 FINDINGS: CHEST: Cardiovascular: No aortic injury. The thoracic aorta is normal in caliber. The heart is normal in size. No significant pericardial effusion. Severe atherosclerotic plaque. Aortic valve replacement. Four-vessel coronary calcification. Mediastinum/Nodes: Moderate volume pneumomediastinum.  No mediastinal hematoma. The esophagus is unremarkable. The thyroid is unremarkable. The central airways are patent. No mediastinal, hilar, or axillary lymphadenopathy. Lungs/Pleura: Left lower lobe pulmonary  contusion. Left upper lobe surgical changes and scarring from wedge resection. No pulmonary nodule. No pulmonary mass. No definite pulmonary laceration. No pneumatocele formation. No pleural effusion. At least small volume loculated left lower pneumothorax. No hemothorax. Musculoskeletal/Chest wall: Extensive subcutaneus soft tissue emphysema along the left chest wall, left back, bilateral neck. Acute full-shaft wdith displaced anterolateral left 4-8 rib fractures. Acute nondisplaced posterior left 5-6 rib fractures. No sternal fracture. No scapular fracture. No clavicular fracture. Intact sternotomy wires. No spinal fracture. ABDOMEN / PELVIS: Hepatobiliary: Not enlarged. No focal lesion. No laceration or subcapsular hematoma. The gallbladder is otherwise unremarkable with no radio-opaque gallstones. No biliary ductal dilatation. Pancreas: Normal pancreatic contour. No main pancreatic duct dilatation. Spleen: Not enlarged. No focal lesion. No laceration, subcapsular hematoma, or vascular injury. Adrenals/Urinary Tract: No nodularity bilaterally. Bilateral kidneys enhance symmetrically. No hydronephrosis. No contusion, laceration, or subcapsular hematoma. No injury to the vascular structures or collecting systems. No hydroureter. The urinary bladder is grossly unremarkable with limited evaluation due to streak artifact originating from bilateral femoral surgical hardware. On delayed imaging, there is no urothelial wall thickening and there are no filling defects in the opacified portions of the bilateral collecting systems or ureters. Stomach/Bowel: No small or large bowel wall thickening or dilatation. The appendix  is unremarkable. Vasculature/Lymphatics: Severe atherosclerotic plaque. No abdominal aorta or iliac aneurysm. No active contrast extravasation or pseudoaneurysm. No abdominal, pelvic, inguinal lymphadenopathy. Reproductive: Coarsened uterine lesions suggestive of degenerative uterine fibroids. Uterus and  bilateral adnexal regions are otherwise unremarkable. Slightly limited evaluation due to streak artifact originating from bilateral hip surgical hardware. Other: No simple free fluid ascites. No pneumoperitoneum. No hemoperitoneum. No mesenteric hematoma identified. No organized fluid collection. Musculoskeletal: Left hip film flank subcutaneus soft tissue hematoma formation. Left chest subcutaneus soft tissue emphysema extends inferiorly along the left flank soft tissues and anterior abdominal soft tissue/groin. Multilevel degenerative changes spine with intervertebral disc space vacuum phenomenon and endplate sclerosis at the L2-L3 level. Grade 1 anterolisthesis of L4 on L5. No acute pelvic fracture. No spinal fracture. Partially visualized total bilateral hip arthroplasty. No CT findings to suggest surgical hardware complication. Ports and Devices: None. IMPRESSION: 1. At least small volume loculated left lower pneumothorax. 2. Moderate volume pneumomediastinum. Finding likely due to trauma in a patient status post left upper lobe wedge resection and surgical changes. No definite other findings of esophageal perforation. 3. Left lower lobe pulmonary contusion. 4. Extensive subcutaneus soft tissue emphysema of the left anterior chest, abdomen, back, neck (bilateral neck). 5. Acute full-shaft width displaced left anterolateral 4-8 rib fractures. Acute nondisplaced posterior left 5-6 rib fractures. No flail chest. 6. Left hip film flank subcutaneus soft tissue hematoma formation. 7. No acute intra-abdominal or intrapelvic traumatic injury. 8. No acute fracture or traumatic malalignment of the thoracic or lumbar spine. 9. Other imaging findings of potential clinical significance: Aortic Atherosclerosis (ICD10-I70.0) including least 4 vessel coronary artery. Status post aortic valve replacement. Total bilateral hip arthroplasty partially visualized. These results were called by telephone at the time of interpretation  on 04/14/2023 at 1:25 pm to provider [facility] , who verbally acknowledged these results. Electronically Signed   By: Tish Frederickson M.D.   On: 04/14/2023 13:41   CT Head Wo Contrast  Result Date: 04/14/2023 CLINICAL DATA:  Head trauma, minor (Age >= 65y); Neck trauma (Age >= 65y). EXAM: CT HEAD WITHOUT CONTRAST CT CERVICAL SPINE WITHOUT CONTRAST TECHNIQUE: Multidetector CT imaging of the head and cervical spine was performed following the standard protocol without intravenous contrast. Multiplanar CT image reconstructions of the cervical spine were also generated. RADIATION DOSE REDUCTION: This exam was performed according to the departmental dose-optimization program which includes automated exposure control, adjustment of the mA and/or kV according to patient size and/or use of iterative reconstruction technique. COMPARISON:  None available. FINDINGS: CT HEAD FINDINGS Brain: No acute intracranial hemorrhage. Gray-white differentiation is preserved. No hydrocephalus or extra-axial collection. No mass effect or midline shift. Vascular: No hyperdense vessel or unexpected calcification. Skull: No calvarial fracture or suspicious bone lesion. Skull base is unremarkable. Sinuses/Orbits: No acute finding. Other: Left frontotemporal scalp hematoma. Subcutaneous emphysema dissecting along the deep fascial planes of the neck to the skull base. CT CERVICAL SPINE FINDINGS Alignment: Normal. Skull base and vertebrae: No acute fracture. Normal craniocervical junction. No suspicious bone lesions. Soft tissues and spinal canal: No prevertebral fluid or swelling. No visible canal hematoma. Disc levels: Multilevel cervical spondylosis, worst at C5-6, where there is at least mild spinal canal stenosis. Upper chest: Trace left apical pneumothorax, better evaluated on same day chest CT. Extensive pneumomediastinum and subcutaneous emphysema tracking along the deep fascial planes of the neck, left-greater-than-right. Other:  Atherosclerotic calcifications of the carotid bulbs. IMPRESSION: 1. No acute intracranial abnormality. 2. Left frontotemporal scalp hematoma without underlying  calvarial fracture. 3. No acute cervical spine fracture or traumatic listhesis. 4. Trace left apical pneumothorax, better evaluated on same day chest CT. Extensive pneumomediastinum and subcutaneous emphysema tracking along the deep fascial planes of the neck, left-greater-than-right. Electronically Signed   By: Orvan Falconer M.D.   On: 04/14/2023 13:16   CT Cervical Spine Wo Contrast  Result Date: 04/14/2023 CLINICAL DATA:  Head trauma, minor (Age >= 65y); Neck trauma (Age >= 65y). EXAM: CT HEAD WITHOUT CONTRAST CT CERVICAL SPINE WITHOUT CONTRAST TECHNIQUE: Multidetector CT imaging of the head and cervical spine was performed following the standard protocol without intravenous contrast. Multiplanar CT image reconstructions of the cervical spine were also generated. RADIATION DOSE REDUCTION: This exam was performed according to the departmental dose-optimization program which includes automated exposure control, adjustment of the mA and/or kV according to patient size and/or use of iterative reconstruction technique. COMPARISON:  None available. FINDINGS: CT HEAD FINDINGS Brain: No acute intracranial hemorrhage. Gray-white differentiation is preserved. No hydrocephalus or extra-axial collection. No mass effect or midline shift. Vascular: No hyperdense vessel or unexpected calcification. Skull: No calvarial fracture or suspicious bone lesion. Skull base is unremarkable. Sinuses/Orbits: No acute finding. Other: Left frontotemporal scalp hematoma. Subcutaneous emphysema dissecting along the deep fascial planes of the neck to the skull base. CT CERVICAL SPINE FINDINGS Alignment: Normal. Skull base and vertebrae: No acute fracture. Normal craniocervical junction. No suspicious bone lesions. Soft tissues and spinal canal: No prevertebral fluid or swelling. No  visible canal hematoma. Disc levels: Multilevel cervical spondylosis, worst at C5-6, where there is at least mild spinal canal stenosis. Upper chest: Trace left apical pneumothorax, better evaluated on same day chest CT. Extensive pneumomediastinum and subcutaneous emphysema tracking along the deep fascial planes of the neck, left-greater-than-right. Other: Atherosclerotic calcifications of the carotid bulbs. IMPRESSION: 1. No acute intracranial abnormality. 2. Left frontotemporal scalp hematoma without underlying calvarial fracture. 3. No acute cervical spine fracture or traumatic listhesis. 4. Trace left apical pneumothorax, better evaluated on same day chest CT. Extensive pneumomediastinum and subcutaneous emphysema tracking along the deep fascial planes of the neck, left-greater-than-right. Electronically Signed   By: Orvan Falconer M.D.   On: 04/14/2023 13:16   DG Chest Port 1 View  Result Date: 04/14/2023 CLINICAL DATA:  Status post fall EXAM: PORTABLE CHEST 1 VIEW COMPARISON:  Chest radiograph dated 09/29/2018 FINDINGS: Extensive left chest wall and left neck subcutaneous emphysema. Left basilar patchy and dense left retrocardiac opacity. Trace left pneumothorax. Similar postsurgical cardiomediastinal silhouette. Multiple displaced left rib fractures. Median sternotomy wires are nondisplaced. IMPRESSION: 1. Multiple displaced left rib fractures with trace left pneumothorax and extensive left chest wall and left neck subcutaneous emphysema. 2. Left basilar patchy and dense left retrocardiac opacity, may reflect a combination of atelectasis and pulmonary contusion. These results were called by telephone at the time of interpretation on 04/14/2023 at 11:52 am to provider [facility] , who verbally acknowledged these results. Electronically Signed   By: Agustin Cree M.D.   On: 04/14/2023 11:52      Scheduled Meds:  acetaminophen  1,000 mg Oral Q6H   carvedilol  6.25 mg Oral BID WC   celecoxib  200 mg  Oral Daily   famotidine  20 mg Oral QHS   guaiFENesin  200 mg Oral Q6H   heparin  5,000 Units Subcutaneous Q8H   insulin aspart  0-15 Units Subcutaneous TID WC   insulin aspart  0-5 Units Subcutaneous QHS   ipratropium-albuterol  3 mL Nebulization Q6H  lidocaine  1 patch Transdermal Q24H   methocarbamol  500 mg Oral Q6H   montelukast  10 mg Oral Daily   sertraline  25 mg Oral Daily   Continuous Infusions:  sodium chloride 100 mL/hr at 04/14/23 2113     LOS: 1 day   Time spent: 40 minutes   Noralee Stain, DO [facility] 04/15/2023, 2:21 PM   Available via Epic secure chat 7am-7pm After these hours, please refer to coverage provider listed on [email or web address]

## 2023-04-15 NOTE — Progress Notes (Signed)
Referring Physician(s): Meridee Score EDP/ Trauma team   Supervising Physician: Mir, Mauri Reading  Patient Status:  [facility], [place] - In-pt  Chief Complaint:  LUL lung CA s/p wedge resection 5 years ago, fall,  rib fx, left PTX and pneumomediastinum seen in CT  S/p left chest tube placement by Dr. Loreta Ave on 04/14/23   Subjective:  Patient laying in bed, NAD, RN at bedside.  States that she still has left sided chest pain, breathing seems slighty better after chest tube placement.    Allergies: Brilinta [ticagrelor], Lisinopril, Asa [aspirin], Blue dyes (parenteral), Corn-containing products, Nsaids, Tea, Rosuvastatin, Statins, Zetia [ezetimibe], Isosulfan blue, and Other  Medications: Prior to Admission medications   Medication Sig Start Date End Date Taking? Authorizing Provider  Acetaminophen (TYLENOL DISSOLVE PACKS) 500 MG PACK Take 1,000 mg by mouth every 6 (six) hours as needed (pain).   Yes [provider]  acetaminophen (TYLENOL) 325 MG tablet Take 650 mg by mouth every 6 (six) hours as needed for moderate pain.   Yes [provider]  amoxicillin (AMOXIL) 500 MG capsule Take 4 capsules by mouth 1 hour before appointment 01/22/22  Yes   carvedilol (COREG) 6.25 MG tablet Take 1 tablet (6.25 mg total) by mouth 2 (two) times daily with a meal. 06/16/22  Yes Dyann Kief, PA-C  cetirizine (ZYRTEC) 10 MG tablet Take 25 mg by mouth daily.   Yes [provider]  COLLAGEN PO Take 1 Scoop by mouth daily. With biotin and vitamin c   Yes [provider]  Dulaglutide (TRULICITY) 1.5 MG/0.5ML SOPN Inject 1.5 mg into the skin once a week. 03/08/23  Yes   Evolocumab (REPATHA SURECLICK) 140 MG/ML SOAJ INJECT 140 MG INTO THE SKIN EVERY 14 DAYS 12/17/21  Yes Lyn Records, MD  famotidine (PEPCID) 20 MG tablet Take 20 mg by mouth at bedtime.   Yes [provider]  fluticasone (FLONASE) 50 MCG/ACT nasal spray Place 1 spray into both nostrils daily.   Yes [provider]  glipiZIDE (GLUCOTROL XL) 10 MG 24 hr tablet TAKE 1 TABLET BY MOUTH DAILY FOR DIABETES CONTROL. 03/15/22  Yes   glipiZIDE (GLUCOTROL XL) 5 MG 24 hr tablet Take 1 tablet (5 mg total) by mouth daily for diabetes. 03/30/23  Yes   losartan (COZAAR) 50 MG tablet TAKE 1 TABLET BY MOUTH DAILY TO PRESERVE KIDNEY FUNCTION. 06/16/22  Yes Dyann Kief, PA-C  Menthol-Methyl Salicylate (MUSCLE RUB EX) Apply 1 application topically as needed (for pain).   Yes [provider]  metFORMIN (GLUCOPHAGE) 850 MG tablet Take 1 tablet (850 mg total) by mouth 2 (two) times daily to control diabetes. 03/08/23  Yes   methocarbamol (ROBAXIN) 500 MG tablet Take one tablet 4 times a day as needed for muscle 03/05/22  Yes   montelukast (SINGULAIR) 10 MG tablet Take 1 tablet by mouth daily for allergy control 03/05/22  Yes   Povidone (IVIZIA DRY EYES OP) Apply 2 drops to eye daily.   Yes [provider]  sertraline (ZOLOFT) 50 MG tablet Take 1 tablet (50 mg total) by mouth daily to control anxiety. Patient taking differently: Take 25 mg by mouth daily. 09/10/22  Yes   celecoxib (CELEBREX) 200 MG capsule Take 1 capsule (200 mg total) by mouth daily as needed. 03/08/23     glucose blood (ONETOUCH ULTRA) test strip TEST BLOOD SUGAR ONCE A DAY 12/11/17   [provider]  prasugrel (EFFIENT) 10 MG TABS tablet Take 1 tablet (  10 mg total) by mouth daily. 07/22/22   Lyn Records, MD     Vital Signs: BP 129/61   Pulse 77   Temp 98.2 F (36.8 C) (Oral)   Resp (!) 21   Ht 5\' 2"  (1.575 m)   Wt 182 lb (82.6 kg)   SpO2 96%   BMI 33.29 kg/m   Physical Exam Vitals reviewed.  Constitutional:      General: She is not in acute distress.    Appearance: She is not ill-appearing.  HENT:     Head: Normocephalic.  Pulmonary:     Effort: Pulmonary effort is normal.     Comments: Left sided chest tube in place, no air leak. ~ 200 mL of Serosanguinous fluid in the collecting system.  Abdominal:      General: Abdomen is flat. Bowel sounds are normal.     Palpations: Abdomen is soft.  Musculoskeletal:     Cervical back: Neck supple.  Skin:    General: Skin is warm and dry.     Coloration: Skin is not jaundiced or pale.  Neurological:     Mental Status: She is alert.  Psychiatric:        Mood and Affect: Mood normal.        Behavior: Behavior normal.     Imaging: DG Chest Port 1 View  Result Date: 04/15/2023 CLINICAL DATA:  Pneumothorax.  Chest tube placed yesterday. EXAM: PORTABLE CHEST 1 VIEW COMPARISON:  One-view chest x-ray 04/14/2023 FINDINGS: The heart is enlarged. Median sternotomy noted. Aortic valve replacement is present. Atherosclerotic changes are present at the aortic arch. A left-sided chest tube is in place. A tiny apical pneumothorax is present. Subcutaneous emphysema is present over the left chest wall and neck. Increasing left pleural effusion and basilar airspace opacity is present. The right lung is clear. Lung volumes are low. IMPRESSION: 1. Tiny left apical pneumothorax with left-sided chest tube in place. 2. Increasing left pleural effusion and basilar airspace opacity likely reflects atelectasis. Infection is not excluded. 3. Cardiomegaly without failure. Electronically Signed   By: Marin Roberts M.D.   On: 04/15/2023 08:26   CT CHEST ABDOMEN PELVIS W CONTRAST  Result Date: 04/14/2023 CLINICAL DATA:  Polytrauma, blunt. Fall. Prior left upper lobe malignancy. EXAM: CT CHEST, ABDOMEN, AND PELVIS WITH CONTRAST TECHNIQUE: Multidetector CT imaging of the chest, abdomen and pelvis was performed following the standard protocol during bolus administration of intravenous contrast. RADIATION DOSE REDUCTION: This exam was performed according to the departmental dose-optimization program which includes automated exposure control, adjustment of the mA and/or kV according to patient size and/or use of iterative reconstruction technique. CONTRAST:  OMNIPAQUE IOHEXOL 300  MG/ML  SOLN COMPARISON:  Chest x-ray 04/14/2023, pet CT 06/29/2018 FINDINGS: CHEST: Cardiovascular: No aortic injury. The thoracic aorta is normal in caliber. The heart is normal in size. No significant pericardial effusion. Severe atherosclerotic plaque. Aortic valve replacement. Four-vessel coronary calcification. Mediastinum/Nodes: Moderate volume pneumomediastinum.  No mediastinal hematoma. The esophagus is unremarkable. The thyroid is unremarkable. The central airways are patent. No mediastinal, hilar, or axillary lymphadenopathy. Lungs/Pleura: Left lower lobe pulmonary contusion. Left upper lobe surgical changes and scarring from wedge resection. No pulmonary nodule. No pulmonary mass. No definite pulmonary laceration. No pneumatocele formation. No pleural effusion. At least small volume loculated left lower pneumothorax. No hemothorax. Musculoskeletal/Chest wall: Extensive subcutaneus soft tissue emphysema along the left chest wall, left back, bilateral neck. Acute full-shaft wdith displaced anterolateral left 4-8 rib fractures. Acute nondisplaced posterior  left 5-6 rib fractures. No sternal fracture. No scapular fracture. No clavicular fracture. Intact sternotomy wires. No spinal fracture. ABDOMEN / PELVIS: Hepatobiliary: Not enlarged. No focal lesion. No laceration or subcapsular hematoma. The gallbladder is otherwise unremarkable with no radio-opaque gallstones. No biliary ductal dilatation. Pancreas: Normal pancreatic contour. No main pancreatic duct dilatation. Spleen: Not enlarged. No focal lesion. No laceration, subcapsular hematoma, or vascular injury. Adrenals/Urinary Tract: No nodularity bilaterally. Bilateral kidneys enhance symmetrically. No hydronephrosis. No contusion, laceration, or subcapsular hematoma. No injury to the vascular structures or collecting systems. No hydroureter. The urinary bladder is grossly unremarkable with limited evaluation due to streak artifact originating from bilateral  femoral surgical hardware. On delayed imaging, there is no urothelial wall thickening and there are no filling defects in the opacified portions of the bilateral collecting systems or ureters. Stomach/Bowel: No small or large bowel wall thickening or dilatation. The appendix is unremarkable. Vasculature/Lymphatics: Severe atherosclerotic plaque. No abdominal aorta or iliac aneurysm. No active contrast extravasation or pseudoaneurysm. No abdominal, pelvic, inguinal lymphadenopathy. Reproductive: Coarsened uterine lesions suggestive of degenerative uterine fibroids. Uterus and bilateral adnexal regions are otherwise unremarkable. Slightly limited evaluation due to streak artifact originating from bilateral hip surgical hardware. Other: No simple free fluid ascites. No pneumoperitoneum. No hemoperitoneum. No mesenteric hematoma identified. No organized fluid collection. Musculoskeletal: Left hip film flank subcutaneus soft tissue hematoma formation. Left chest subcutaneus soft tissue emphysema extends inferiorly along the left flank soft tissues and anterior abdominal soft tissue/groin. Multilevel degenerative changes spine with intervertebral disc space vacuum phenomenon and endplate sclerosis at the L2-L3 level. Grade 1 anterolisthesis of L4 on L5. No acute pelvic fracture. No spinal fracture. Partially visualized total bilateral hip arthroplasty. No CT findings to suggest surgical hardware complication. Ports and Devices: None. IMPRESSION: 1. At least small volume loculated left lower pneumothorax. 2. Moderate volume pneumomediastinum. Finding likely due to trauma in a patient status post left upper lobe wedge resection and surgical changes. No definite other findings of esophageal perforation. 3. Left lower lobe pulmonary contusion. 4. Extensive subcutaneus soft tissue emphysema of the left anterior chest, abdomen, back, neck (bilateral neck). 5. Acute full-shaft width displaced left anterolateral 4-8 rib  fractures. Acute nondisplaced posterior left 5-6 rib fractures. No flail chest. 6. Left hip film flank subcutaneus soft tissue hematoma formation. 7. No acute intra-abdominal or intrapelvic traumatic injury. 8. No acute fracture or traumatic malalignment of the thoracic or lumbar spine. 9. Other imaging findings of potential clinical significance: Aortic Atherosclerosis (ICD10-I70.0) including least 4 vessel coronary artery. Status post aortic valve replacement. Total bilateral hip arthroplasty partially visualized. These results were called by telephone at the time of interpretation on 04/14/2023 at 1:25 pm to provider [facility] , who verbally acknowledged these results. Electronically Signed   By: Tish Frederickson M.D.   On: 04/14/2023 13:41   CT Head Wo Contrast  Result Date: 04/14/2023 CLINICAL DATA:  Head trauma, minor (Age >= 65y); Neck trauma (Age >= 65y). EXAM: CT HEAD WITHOUT CONTRAST CT CERVICAL SPINE WITHOUT CONTRAST TECHNIQUE: Multidetector CT imaging of the head and cervical spine was performed following the standard protocol without intravenous contrast. Multiplanar CT image reconstructions of the cervical spine were also generated. RADIATION DOSE REDUCTION: This exam was performed according to the departmental dose-optimization program which includes automated exposure control, adjustment of the mA and/or kV according to patient size and/or use of iterative reconstruction technique. COMPARISON:  None available. FINDINGS: CT HEAD FINDINGS Brain: No acute intracranial hemorrhage. Gray-white differentiation is preserved. No  hydrocephalus or extra-axial collection. No mass effect or midline shift. Vascular: No hyperdense vessel or unexpected calcification. Skull: No calvarial fracture or suspicious bone lesion. Skull base is unremarkable. Sinuses/Orbits: No acute finding. Other: Left frontotemporal scalp hematoma. Subcutaneous emphysema dissecting along the deep fascial planes of the neck to the  skull base. CT CERVICAL SPINE FINDINGS Alignment: Normal. Skull base and vertebrae: No acute fracture. Normal craniocervical junction. No suspicious bone lesions. Soft tissues and spinal canal: No prevertebral fluid or swelling. No visible canal hematoma. Disc levels: Multilevel cervical spondylosis, worst at C5-6, where there is at least mild spinal canal stenosis. Upper chest: Trace left apical pneumothorax, better evaluated on same day chest CT. Extensive pneumomediastinum and subcutaneous emphysema tracking along the deep fascial planes of the neck, left-greater-than-right. Other: Atherosclerotic calcifications of the carotid bulbs. IMPRESSION: 1. No acute intracranial abnormality. 2. Left frontotemporal scalp hematoma without underlying calvarial fracture. 3. No acute cervical spine fracture or traumatic listhesis. 4. Trace left apical pneumothorax, better evaluated on same day chest CT. Extensive pneumomediastinum and subcutaneous emphysema tracking along the deep fascial planes of the neck, left-greater-than-right. Electronically Signed   By: Orvan Falconer M.D.   On: 04/14/2023 13:16   CT Cervical Spine Wo Contrast  Result Date: 04/14/2023 CLINICAL DATA:  Head trauma, minor (Age >= 65y); Neck trauma (Age >= 65y). EXAM: CT HEAD WITHOUT CONTRAST CT CERVICAL SPINE WITHOUT CONTRAST TECHNIQUE: Multidetector CT imaging of the head and cervical spine was performed following the standard protocol without intravenous contrast. Multiplanar CT image reconstructions of the cervical spine were also generated. RADIATION DOSE REDUCTION: This exam was performed according to the departmental dose-optimization program which includes automated exposure control, adjustment of the mA and/or kV according to patient size and/or use of iterative reconstruction technique. COMPARISON:  None available. FINDINGS: CT HEAD FINDINGS Brain: No acute intracranial hemorrhage. Gray-white differentiation is preserved. No hydrocephalus or  extra-axial collection. No mass effect or midline shift. Vascular: No hyperdense vessel or unexpected calcification. Skull: No calvarial fracture or suspicious bone lesion. Skull base is unremarkable. Sinuses/Orbits: No acute finding. Other: Left frontotemporal scalp hematoma. Subcutaneous emphysema dissecting along the deep fascial planes of the neck to the skull base. CT CERVICAL SPINE FINDINGS Alignment: Normal. Skull base and vertebrae: No acute fracture. Normal craniocervical junction. No suspicious bone lesions. Soft tissues and spinal canal: No prevertebral fluid or swelling. No visible canal hematoma. Disc levels: Multilevel cervical spondylosis, worst at C5-6, where there is at least mild spinal canal stenosis. Upper chest: Trace left apical pneumothorax, better evaluated on same day chest CT. Extensive pneumomediastinum and subcutaneous emphysema tracking along the deep fascial planes of the neck, left-greater-than-right. Other: Atherosclerotic calcifications of the carotid bulbs. IMPRESSION: 1. No acute intracranial abnormality. 2. Left frontotemporal scalp hematoma without underlying calvarial fracture. 3. No acute cervical spine fracture or traumatic listhesis. 4. Trace left apical pneumothorax, better evaluated on same day chest CT. Extensive pneumomediastinum and subcutaneous emphysema tracking along the deep fascial planes of the neck, left-greater-than-right. Electronically Signed   By: Orvan Falconer M.D.   On: 04/14/2023 13:16   DG Chest Port 1 View  Result Date: 04/14/2023 CLINICAL DATA:  Status post fall EXAM: PORTABLE CHEST 1 VIEW COMPARISON:  Chest radiograph dated 09/29/2018 FINDINGS: Extensive left chest wall and left neck subcutaneous emphysema. Left basilar patchy and dense left retrocardiac opacity. Trace left pneumothorax. Similar postsurgical cardiomediastinal silhouette. Multiple displaced left rib fractures. Median sternotomy wires are nondisplaced. IMPRESSION: 1. Multiple  displaced left rib fractures with trace  left pneumothorax and extensive left chest wall and left neck subcutaneous emphysema. 2. Left basilar patchy and dense left retrocardiac opacity, may reflect a combination of atelectasis and pulmonary contusion. These results were called by telephone at the time of interpretation on 04/14/2023 at 11:52 am to provider [facility] , who verbally acknowledged these results. Electronically Signed   By: Agustin Cree M.D.   On: 04/14/2023 11:52    Labs:  CBC: Recent Labs    08/06/22 0927 04/14/23 1020 04/15/23 0227  WBC 11.8* 12.9* 9.1  HGB 14.2 12.8 12.4  HCT 42.8 37.7 38.2  PLT 227 205 177    COAGS: Recent Labs    04/14/23 1020  INR 1.1    BMP: Recent Labs    08/06/22 0927 04/14/23 1020 04/15/23 0227  NA 139 131* 136  K 4.2 4.3 4.1  CL 103 96* 103  CO2 30 23 24   GLUCOSE 155* 193* 165*  BUN 15 31* 29*  CALCIUM 9.6 8.7* 8.3*  CREATININE 0.98 1.25* 1.20*  GFRNONAA >60 48* 50*    LIVER FUNCTION TESTS: Recent Labs    08/06/22 0927 04/14/23 1020  BILITOT 0.7 1.3*  AST 11* 27  ALT 13 21  ALKPHOS 85 60  PROT 7.7 7.1  ALBUMIN 4.3 3.9    Assessment and Plan:  67 y.o. female with MI, DM, CAD, OA, LUL lung CA s/p wedge resection 5 years ago, fall on 8/6, CT showed left rib fx,  left PTX and pneumomediastinum, s/p left chest tube placement by Dr. Loreta Ave on 04/14/23.   CXR today: 1. Tiny left apical pneumothorax with left-sided chest tube in place. 2. Increasing left pleural effusion and basilar airspace opacity likely reflects atelectasis. Infection is not excluded. 3. Cardiomegaly without failure.  Overnight output 100 mL- serosanguinous fluid in the Pleur-evac  Patient remains on 3L via [place], O2 sat stable  Has not needed narcotic for pain control   Plan - CXR in AM - Will consider water seal trial if CXR stable    Further treatment plan per Trauma team/TRH Appreciate and agree with the plan.  IR to follow.     Electronically Signed: Willette Brace, PA-C 04/15/2023, 8:35 AM   I spent a total of 15 Minutes at the the patient's bedside AND on the patient's hospital floor or unit, greater than 50% of which was counseling/coordinating care for Left chest tube f/u.   This chart was dictated using voice recognition software.  Despite best efforts to proofread,  errors can occur which can change the documentation meaning.

## 2023-04-15 NOTE — Evaluation (Signed)
Physical Therapy Evaluation Patient Details Name: Sarah Stevenson MRN: [id number] DOB: [birth year] Today's Date: 04/15/2023  History of Present Illness  Pt is 67 yo female who presents on 04/14/23 after falling down steps when letting her dog out. Hit head with + LOC. Pt with L rib fxs, pneumomediastinum, and small pneumothorax (CT placed). PMH: CAD sp CABG, AS sp AVR, HLD, depression, T2DM, history of lung cancer, B THA  Clinical Impression  Pt admitted with above diagnosis. Pt seen in ED, family present. Pt from home alone but can go to son's house short term if needed. Is a retired Char[employer] fundraiser, active and independent at baseline. Demonstrates normal strength bilaterally and mobilizing with min A due to pain and lines. Main concern is O2 desat to 87% on RA. Remains in low 90's on 2L O2 via [place]. Will follow acutely but do not anticipate her needing any PT at d/c.  Pt currently with functional limitations due to the deficits listed below (see PT Problem List). Pt will benefit from acute skilled PT to increase their independence and safety with mobility to allow discharge.           If plan is discharge home, recommend the following: A little help with walking and/or transfers;A little help with bathing/dressing/bathroom;Assist for transportation;Help with stairs or ramp for entrance;Assistance with cooking/housework   Can travel by private vehicle        Equipment Recommendations None recommended by PT  Recommendations for Other Services       Functional Status Assessment Patient has had a recent decline in their functional status and demonstrates the ability to make significant improvements in function in a reasonable and predictable amount of time.     Precautions / Restrictions Precautions Precautions: Fall Precaution Comments: L chest tube, watch sats Restrictions Weight Bearing Restrictions: No      Mobility  Bed Mobility Overal bed mobility: Needs Assistance Bed Mobility: Supine to  Sit     Supine to sit: Contact guard     General bed mobility comments: discussed mgmt of rib pain with bed mobility    Transfers Overall transfer level: Needs assistance Equipment used: 1 person hand held assist Transfers: Sit to/from Stand, Bed to chair/wheelchair/BSC Sit to Stand: Min assist   Step pivot transfers: Min assist       General transfer comment: min HHA to steady and manage lines    Ambulation/Gait               General Gait Details: pt connected to wall suction which limited gait  Stairs            Wheelchair Mobility     Tilt Bed    Modified Rankin (Stroke Patients Only)       Balance Overall balance assessment: Mild deficits observed, not formally tested                                           Pertinent Vitals/Pain Pain Assessment Pain Assessment: Faces Faces Pain Scale: Hurts even more Pain Location: L side with mvmt Pain Descriptors / Indicators:  (grabbing) Pain Intervention(s): Limited activity within patient's tolerance, Monitored during session    Home Living Family/patient expects to be discharged to:: Private residence Living Arrangements: Alone Available Help at Discharge: Family;Available PRN/intermittently Type of Home: House Home Access: Stairs to enter Entrance Stairs-Rails: Right Entrance Stairs-Number of Steps: 1  Home Layout: One level Home Equipment: Agricultural consultant (2 wheels);Shower seat;Rollator (4 wheels);Cane - single point      Prior Function Prior Level of Function : Independent/Modified Independent;Driving             Mobility Comments: retired Charity fundraiser, independent, drives ADLs Comments: independent     Extremity/Trunk Assessment   Upper Extremity Assessment Upper Extremity Assessment: Overall WFL for tasks assessed    Lower Extremity Assessment Lower Extremity Assessment: Overall WFL for tasks assessed    Cervical / Trunk Assessment Cervical / Trunk Assessment:  Normal  Communication   Communication Communication: No apparent difficulties  Cognition Arousal: Alert Behavior During Therapy: WFL for tasks assessed/performed Overall Cognitive Status: Within Functional Limits for tasks assessed                                          General Comments General comments (skin integrity, edema, etc.): Pt on 2L O2 with sats in low 90's, when on RA desat to 87%. BP 147/50, HR in 70's. Discussed use of incentive spirometer when pt get up to a regular room (eval was in ED)    Exercises     Assessment/Plan    PT Assessment Patient needs continued PT services  PT Problem List Decreased mobility;Pain;Cardiopulmonary status limiting activity       PT Treatment Interventions Gait training;Stair training;Functional mobility training;Therapeutic activities;Therapeutic exercise;Patient/family education    PT Goals (Current goals can be found in the Care Plan section)  Acute Rehab PT Goals Patient Stated Goal: return home PT Goal Formulation: With patient Time For Goal Achievement: 04/29/23 Potential to Achieve Goals: Good    Frequency Min 1X/week     Co-evaluation               AM-PAC PT "6 Clicks" Mobility  Outcome Measure Help needed turning from your back to your side while in a flat bed without using bedrails?: A Little Help needed moving from lying on your back to sitting on the side of a flat bed without using bedrails?: A Little Help needed moving to and from a bed to a chair (including a wheelchair)?: A Little Help needed standing up from a chair using your arms (e.g., wheelchair or bedside chair)?: A Little Help needed to walk in hospital room?: A Little Help needed climbing 3-5 steps with a railing? : A Little 6 Click Score: 18    End of Session Equipment Utilized During Treatment: Oxygen Activity Tolerance: Patient tolerated treatment well Patient left: in chair;with call bell/phone within reach;with  family/visitor present Nurse Communication: Mobility status PT Visit Diagnosis: Pain;Difficulty in walking, not elsewhere classified (R26.2) Pain - Right/Left: Left Pain - part of body:  (chest)    Time: 1259-1330 PT Time Calculation (min) (ACUTE ONLY): 31 min   Charges:   PT Evaluation $PT Eval Moderate Complexity: 1 Mod PT Treatments $Therapeutic Activity: 8-22 mins PT General Charges $$ ACUTE PT VISIT: 1 Visit         Lyanne Co, PT  Acute Rehab Services Secure chat preferred Office [phone]   Lawana Chambers  04/15/2023, 2:08 PM

## 2023-04-15 NOTE — ED Notes (Signed)
ED TO INPATIENT HANDOFF REPORT  ED Nurse Name and Phone #:  [phone]  S Name/Age/Gender Sarah Stevenson 67 y.o. female Room/Bed: 005C/005C  Code Status   Code Status: Full Code  Home/SNF/Other Home Patient oriented to: self, place, time, and situation Is this baseline? Yes   Triage Complete: Triage complete  Chief Complaint Multiple rib fractures involving four or more ribs [S22.49XA]  Triage Note Patient presents to ED via POV from home. Here post fall. Fell last night. Amnesic to event. Patient reports remembering waking up outside. Suspects she fell off her back deck. Estimated down time was 2 hours. Positive LOC. Patient is on Effient, has not had it in 2 days. Abrasion and hematoma noted to left side of forehead. Reports right sided pain. GCS 15.   Allergies Allergies  Allergen Reactions   Brilinta [Ticagrelor] Shortness Of Breath   Lisinopril Cough   Asa [Aspirin] Hives and Rash   Blue Dyes (Parenteral) Hives   Corn-Containing Products Hives   Nsaids Hives and Rash   Tea Hives   Rosuvastatin     myalgias   Statins    Zetia [Ezetimibe]     myalgias   Isosulfan Blue Rash   Other Itching and Rash    CHG soap    Level of Care/Admitting Diagnosis ED Disposition     ED Disposition  Admit   Condition  --   Comment  Hospital Area: [facility] [[id number]]  Level of Care: Progressive [102]  Admit to Progressive based on following criteria: MULTISYSTEM THREATS such as stable sepsis, metabolic/electrolyte imbalance with or without encephalopathy that is responding to early treatment.  May admit patient to [facility] or [facility] if equivalent level of care is available:: No  Covid Evaluation: Confirmed COVID Negative  Diagnosis: Multiple rib fractures involving four or more ribs [[id number]]  Admitting Physician: Gery Pray [[id number]]  Attending Physician: Gery Pray [[id number]]  Certification:: I certify this patient will need inpatient services  for at least 2 midnights  Estimated Length of Stay: 2          B Medical/Surgery History Past Medical History:  Diagnosis Date   Anginal pain (HCC)    Aortic stenosis    Coronary artery disease    Depression    "mild" (08/06/2016)   Dyspnea    Hyperlipidemia    lung ca dx'd 05/2018   Murmur, cardiac    Myocardial infarction (HCC) 2016   Osteoarthritis    "left hip and knee" (08/06/2016)   Type II diabetes mellitus (HCC)    Past Surgical History:  Procedure Laterality Date   AORTIC VALVE REPLACEMENT N/A 05/17/2018   Procedure: AORTIC VALVE REPLACEMENT (AVR) USING 21 MM MAGNA EASE PERICARDIAL BIOPROSTHESIS. MODEL # [id number]. SERIAL # [id number];  Surgeon: Delight Ovens, MD;  Location: [facility] OR;  Service: Open Heart Surgery;  Laterality: N/A;   CARDIAC CATHETERIZATION Right 11/30/2014   Procedure: RIGHT/LEFT HEART CATH AND CORONARY ANGIOGRAPHY;  Surgeon: Lyn Records, MD;  Location[facility] CATH LAB;  Service: Cardiovascular;  Laterality: Right;   CARDIAC CATHETERIZATION N/A 12/13/2015   Procedure: Left Heart Cath and Coronary Angiography;  Surgeon: Marykay Lex, MD;  Location: [facility] INVASIVE CV LAB;  Service: Cardiovascular;  Laterality: N/A;   CARDIAC CATHETERIZATION N/A 12/13/2015   Procedure: Intravascular Pressure Wire/FFR Study;  Surgeon: Marykay Lex, MD;  Location: [facility] INVASIVE CV LAB;  Service: Cardiovascular;  Laterality: N/A;  RCA   CARDIAC CATHETERIZATION N/A 08/06/2016   Procedure: Right/Left  Heart Cath and Coronary Angiography;  Surgeon: Runell Gess, MD;  Location: [facility] INVASIVE CV LAB;  Service: Cardiovascular;  Laterality: N/A;   CARDIAC CATHETERIZATION N/A 08/06/2016   Procedure: Coronary Stent Intervention;  Surgeon: Runell Gess, MD;  Location: [facility] INVASIVE CV LAB;  Service: Cardiovascular;  Laterality: N/A;   distal rca 3.0x16 and 3.0x8 synergy   CESAREAN SECTION  1989   CORONARY ANGIOPLASTY WITH STENT PLACEMENT  08/06/2016   "2 stents"   CORONARY ARTERY  BYPASS GRAFT N/A 05/17/2018   Procedure: CORONARY ARTERY BYPASS GRAFTING (CABG) X 2 WITH ENDOSCOPIC HARVESTING OF RIGHT SAPHENOUS VEIN. LIMA TO LAD. SVG TO RCA;  Surgeon: Delight Ovens, MD;  Location[facility] OR;  Service: Open Heart Surgery;  Laterality: N/A;   ETHMOIDECTOMY Right 07/28/2021   Procedure: ETHMOIDECTOMY;  Surgeon: Newman Pies, MD;  Location: [facility];  Service: ENT;  Laterality: Right;   FRONTAL SINUS EXPLORATION Right 07/28/2021   Procedure: FRONTAL SINUS EXPLORATION;  Surgeon: Newman Pies, MD;  Location: [facility];  Service: ENT;  Laterality: Right;   HYSTEROSCOPY DIAGNOSTIC  1990s   JOINT REPLACEMENT     KNEE ARTHROSCOPY Left 1996   MAXILLARY ANTROSTOMY Right 07/28/2021   Procedure: MAXILLARY ANTROSTOMY WITH TISSUE REMOVAL;  Surgeon: Newman Pies, MD;  Location: [facility];  Service: ENT;  Laterality: Right;   NASAL SINUS SURGERY  1977   NASAL SINUS SURGERY Right 07/28/2021   Procedure: ENDOSCOPIC SINUS SURGERY WITH STEALTH NAVIGATION;  Surgeon: Newman Pies, MD;  Location: [facility];  Service: ENT;  Laterality: Right;   RIGHT/LEFT HEART CATH AND CORONARY ANGIOGRAPHY N/A 04/01/2018   Procedure: RIGHT/LEFT HEART CATH AND CORONARY ANGIOGRAPHY;  Surgeon: Lyn Records, MD;  Location: [facility] INVASIVE CV LAB;  Service: Cardiovascular;  Laterality: N/A;   TEE WITHOUT CARDIOVERSION N/A 05/17/2018   Procedure: TRANSESOPHAGEAL ECHOCARDIOGRAM (TEE);  Surgeon: Delight Ovens, MD;  Location: [facility] OR;  Service: Open Heart Surgery;  Laterality: N/A;   TOTAL HIP ARTHROPLASTY Left 04/23/2017   Procedure: LEFT TOTAL HIP ARTHROPLASTY ANTERIOR APPROACH;  Surgeon: Kathryne Hitch, MD;  Location: [facility] ORS;  Service: Orthopedics;  Laterality: Left;   TOTAL HIP ARTHROPLASTY Right 10/17/2021   Procedure: TOTAL HIP ARTHROPLASTY ANTERIOR APPROACH;  Surgeon: Kathryne Hitch, MD;  Location: [facility] ORS;  Service: Orthopedics;  Laterality: Right;    WEDGE RESECTION Left 05/17/2018   Procedure: LEFT UPPER LOBE LUNG WEDGE RESECTION;  Surgeon: Delight Ovens, MD;  Location: [facility] OR;  Service: Open Heart Surgery;  Laterality: Left;     A IV Location/Drains/Wounds Patient Lines/Drains/Airways Status     Active Line/Drains/Airways     Name Placement date Placement time Site Days   Peripheral IV 04/14/23 20 G Left Antecubital 04/14/23  1037  Antecubital  1   Chest Tube 1 Lateral;Left Pleural 10 Fr. 04/14/23  1641  Pleural  1   Incision (Closed) 10/17/21 Hip Right 10/17/21  1209  -- 545            Intake/Output Last 24 hours  Intake/Output Summary (Last 24 hours) at 04/15/2023 7829 Last data filed at 04/15/2023 5621 Gross per 24 hour  Intake 501.18 ml  Output 100 ml  Net 401.18 ml    Labs/Imaging Results for orders placed or performed during the hospital encounter of 04/14/23 (from the past 48 hour(s))  Comprehensive metabolic panel     Status: Abnormal   Collection Time: 04/14/23 10:20 AM  Result Value Ref  Range   Sodium 131 (L) 135 - 145 mmol/L   Potassium 4.3 3.5 - 5.1 mmol/L   Chloride 96 (L) 98 - 111 mmol/L   CO2 23 22 - 32 mmol/L   Glucose, Bld 193 (H) 70 - 99 mg/dL    Comment: Glucose reference range applies only to samples taken after fasting for at least 8 hours.   BUN 31 (H) 8 - 23 mg/dL   Creatinine, Ser 1.09 (H) 0.44 - 1.00 mg/dL   Calcium 8.7 (L) 8.9 - 10.3 mg/dL   Total Protein 7.1 6.5 - 8.1 g/dL   Albumin 3.9 3.5 - 5.0 g/dL   AST 27 15 - 41 U/L   ALT 21 0 - 44 U/L   Alkaline Phosphatase 60 38 - 126 U/L   Total Bilirubin 1.3 (H) 0.3 - 1.2 mg/dL   GFR, Estimated 48 (L) >60 mL/min    Comment: (NOTE) Calculated using the CKD-EPI Creatinine Equation (2021)    Anion gap 12 5 - 15    Comment: Performed at [facility], [address] [place], [place] [address]  CBC with Differential     Status: Abnormal   Collection Time: 04/14/23 10:20 AM  Result Value Ref Range   WBC 12.9 (H) 4.0 - 10.5  K/uL   RBC 4.13 3.87 - 5.11 MIL/uL   Hemoglobin 12.8 12.0 - 15.0 g/dL   HCT 09.8 11.9 - 14.7 %   MCV 91.3 80.0 - 100.0 fL   MCH 31.0 26.0 - 34.0 pg   MCHC 34.0 30.0 - 36.0 g/dL   RDW 82.9 56.2 - 13.0 %   Platelets 205 150 - 400 K/uL   nRBC 0.0 0.0 - 0.2 %   Neutrophils Relative % 76 %   Neutro Abs 9.8 (H) 1.7 - 7.7 K/uL   Lymphocytes Relative 13 %   Lymphs Abs 1.7 0.7 - 4.0 K/uL   Monocytes Relative 11 %   Monocytes Absolute 1.4 (H) 0.1 - 1.0 K/uL   Eosinophils Relative 0 %   Eosinophils Absolute 0.0 0.0 - 0.5 K/uL   Basophils Relative 0 %   Basophils Absolute 0.0 0.0 - 0.1 K/uL   Immature Granulocytes 0 %   Abs Immature Granulocytes 0.05 0.00 - 0.07 K/uL    Comment: Performed at [facility], [address] [place], [place] [address]  Protime-INR     Status: None   Collection Time: 04/14/23 10:20 AM  Result Value Ref Range   Prothrombin Time 14.5 11.4 - 15.2 seconds   INR 1.1 0.8 - 1.2    Comment: (NOTE) INR goal varies based on device and disease states. Performed at [facility], [address] [place], [place] [address]   CBG monitoring, ED     Status: Abnormal   Collection Time: 04/14/23 10:41 AM  Result Value Ref Range   Glucose-Capillary 187 (H) 70 - 99 mg/dL    Comment: Glucose reference range applies only to samples taken after fasting for at least 8 hours.  Urinalysis, Routine w reflex microscopic -Urine, Clean Catch     Status: None   Collection Time: 04/14/23 12:31 PM  Result Value Ref Range   Color, Urine YELLOW YELLOW   APPearance CLEAR CLEAR   Specific Gravity, Urine 1.010 1.005 - 1.030   pH 5.5 5.0 - 8.0   Glucose, UA NEGATIVE NEGATIVE mg/dL   Hgb urine dipstick NEGATIVE NEGATIVE   Bilirubin Urine NEGATIVE NEGATIVE   Ketones, ur  NEGATIVE NEGATIVE mg/dL   Protein, ur NEGATIVE NEGATIVE mg/dL   Nitrite NEGATIVE NEGATIVE   Leukocytes,Ua NEGATIVE NEGATIVE    Comment: Microscopic not done on urines with negative protein, blood,  leukocytes, nitrite, or glucose < 500 mg/dL. Performed at [facility], [address] [place], [place] [address]   CBG monitoring, ED     Status: Abnormal   Collection Time: 04/14/23  3:23 PM  Result Value Ref Range   Glucose-Capillary 121 (H) 70 - 99 mg/dL    Comment: Glucose reference range applies only to samples taken after fasting for at least 8 hours.  CBG monitoring, ED     Status: None   Collection Time: 04/14/23  5:36 PM  Result Value Ref Range   Glucose-Capillary 86 70 - 99 mg/dL    Comment: Glucose reference range applies only to samples taken after fasting for at least 8 hours.  CBG monitoring, ED     Status: Abnormal   Collection Time: 04/14/23  9:18 PM  Result Value Ref Range   Glucose-Capillary 139 (H) 70 - 99 mg/dL    Comment: Glucose reference range applies only to samples taken after fasting for at least 8 hours.  CBC     Status: None   Collection Time: 04/15/23  2:27 AM  Result Value Ref Range   WBC 9.1 4.0 - 10.5 K/uL   RBC 4.04 3.87 - 5.11 MIL/uL   Hemoglobin 12.4 12.0 - 15.0 g/dL   HCT 69.6 29.5 - 28.4 %   MCV 94.6 80.0 - 100.0 fL   MCH 30.7 26.0 - 34.0 pg   MCHC 32.5 30.0 - 36.0 g/dL   RDW 13.2 44.0 - 10.2 %   Platelets 177 150 - 400 K/uL   nRBC 0.0 0.0 - 0.2 %    Comment: Performed at [facility], [address], [place], [place] [address]  Basic metabolic panel     Status: Abnormal   Collection Time: 04/15/23  2:27 AM  Result Value Ref Range   Sodium 136 135 - 145 mmol/L   Potassium 4.1 3.5 - 5.1 mmol/L   Chloride 103 98 - 111 mmol/L   CO2 24 22 - 32 mmol/L   Glucose, Bld 165 (H) 70 - 99 mg/dL    Comment: Glucose reference range applies only to samples taken after fasting for at least 8 hours.   BUN 29 (H) 8 - 23 mg/dL   Creatinine, Ser 6.44 (H) 0.44 - 1.00 mg/dL   Calcium 8.3 (L) 8.9 - 10.3 mg/dL   GFR, Estimated 50 (L) >60 mL/min    Comment: (NOTE) Calculated using the CKD-EPI Creatinine Equation (2021)    Anion gap 9 5 -  15    Comment: Performed at [facility], [address], [place], [place] [address]  Hemoglobin A1c     Status: Abnormal   Collection Time: 04/15/23  2:27 AM  Result Value Ref Range   Hgb A1c MFr Bld 5.9 (H) 4.8 - 5.6 %    Comment: (NOTE) Pre diabetes:          5.7%-6.4%  Diabetes:              >6.4%  Glycemic control for   <7.0% adults with diabetes    Mean Plasma Glucose 122.63 mg/dL    Comment: Performed at [facility], [address], [place], [place] [address]  CBG monitoring, ED     Status: Abnormal   Collection  Time: 04/15/23  8:21 AM  Result Value Ref Range   Glucose-Capillary 126 (H) 70 - 99 mg/dL    Comment: Glucose reference range applies only to samples taken after fasting for at least 8 hours.   DG Chest Port 1 View  Result Date: 04/15/2023 CLINICAL DATA:  Pneumothorax.  Chest tube placed yesterday. EXAM: PORTABLE CHEST 1 VIEW COMPARISON:  One-view chest x-ray 04/14/2023 FINDINGS: The heart is enlarged. Median sternotomy noted. Aortic valve replacement is present. Atherosclerotic changes are present at the aortic arch. A left-sided chest tube is in place. A tiny apical pneumothorax is present. Subcutaneous emphysema is present over the left chest wall and neck. Increasing left pleural effusion and basilar airspace opacity is present. The right lung is clear. Lung volumes are low. IMPRESSION: 1. Tiny left apical pneumothorax with left-sided chest tube in place. 2. Increasing left pleural effusion and basilar airspace opacity likely reflects atelectasis. Infection is not excluded. 3. Cardiomegaly without failure. Electronically Signed   By: Marin Roberts M.D.   On: 04/15/2023 08:26   CT CHEST ABDOMEN PELVIS W CONTRAST  Result Date: 04/14/2023 CLINICAL DATA:  Polytrauma, blunt. Fall. Prior left upper lobe malignancy. EXAM: CT CHEST, ABDOMEN, AND PELVIS WITH CONTRAST TECHNIQUE: Multidetector CT imaging of the chest, abdomen and pelvis was performed following  the standard protocol during bolus administration of intravenous contrast. RADIATION DOSE REDUCTION: This exam was performed according to the departmental dose-optimization program which includes automated exposure control, adjustment of the mA and/or kV according to patient size and/or use of iterative reconstruction technique. CONTRAST:  OMNIPAQUE IOHEXOL 300 MG/ML  SOLN COMPARISON:  Chest x-ray 04/14/2023, pet CT 06/29/2018 FINDINGS: CHEST: Cardiovascular: No aortic injury. The thoracic aorta is normal in caliber. The heart is normal in size. No significant pericardial effusion. Severe atherosclerotic plaque. Aortic valve replacement. Four-vessel coronary calcification. Mediastinum/Nodes: Moderate volume pneumomediastinum.  No mediastinal hematoma. The esophagus is unremarkable. The thyroid is unremarkable. The central airways are patent. No mediastinal, hilar, or axillary lymphadenopathy. Lungs/Pleura: Left lower lobe pulmonary contusion. Left upper lobe surgical changes and scarring from wedge resection. No pulmonary nodule. No pulmonary mass. No definite pulmonary laceration. No pneumatocele formation. No pleural effusion. At least small volume loculated left lower pneumothorax. No hemothorax. Musculoskeletal/Chest wall: Extensive subcutaneus soft tissue emphysema along the left chest wall, left back, bilateral neck. Acute full-shaft wdith displaced anterolateral left 4-8 rib fractures. Acute nondisplaced posterior left 5-6 rib fractures. No sternal fracture. No scapular fracture. No clavicular fracture. Intact sternotomy wires. No spinal fracture. ABDOMEN / PELVIS: Hepatobiliary: Not enlarged. No focal lesion. No laceration or subcapsular hematoma. The gallbladder is otherwise unremarkable with no radio-opaque gallstones. No biliary ductal dilatation. Pancreas: Normal pancreatic contour. No main pancreatic duct dilatation. Spleen: Not enlarged. No focal lesion. No laceration, subcapsular hematoma, or  vascular injury. Adrenals/Urinary Tract: No nodularity bilaterally. Bilateral kidneys enhance symmetrically. No hydronephrosis. No contusion, laceration, or subcapsular hematoma. No injury to the vascular structures or collecting systems. No hydroureter. The urinary bladder is grossly unremarkable with limited evaluation due to streak artifact originating from bilateral femoral surgical hardware. On delayed imaging, there is no urothelial wall thickening and there are no filling defects in the opacified portions of the bilateral collecting systems or ureters. Stomach/Bowel: No small or large bowel wall thickening or dilatation. The appendix is unremarkable. Vasculature/Lymphatics: Severe atherosclerotic plaque. No abdominal aorta or iliac aneurysm. No active contrast extravasation or pseudoaneurysm. No abdominal, pelvic, inguinal lymphadenopathy. Reproductive: Coarsened uterine lesions suggestive of degenerative uterine fibroids. Uterus  and bilateral adnexal regions are otherwise unremarkable. Slightly limited evaluation due to streak artifact originating from bilateral hip surgical hardware. Other: No simple free fluid ascites. No pneumoperitoneum. No hemoperitoneum. No mesenteric hematoma identified. No organized fluid collection. Musculoskeletal: Left hip film flank subcutaneus soft tissue hematoma formation. Left chest subcutaneus soft tissue emphysema extends inferiorly along the left flank soft tissues and anterior abdominal soft tissue/groin. Multilevel degenerative changes spine with intervertebral disc space vacuum phenomenon and endplate sclerosis at the L2-L3 level. Grade 1 anterolisthesis of L4 on L5. No acute pelvic fracture. No spinal fracture. Partially visualized total bilateral hip arthroplasty. No CT findings to suggest surgical hardware complication. Ports and Devices: None. IMPRESSION: 1. At least small volume loculated left lower pneumothorax. 2. Moderate volume pneumomediastinum. Finding likely  due to trauma in a patient status post left upper lobe wedge resection and surgical changes. No definite other findings of esophageal perforation. 3. Left lower lobe pulmonary contusion. 4. Extensive subcutaneus soft tissue emphysema of the left anterior chest, abdomen, back, neck (bilateral neck). 5. Acute full-shaft width displaced left anterolateral 4-8 rib fractures. Acute nondisplaced posterior left 5-6 rib fractures. No flail chest. 6. Left hip film flank subcutaneus soft tissue hematoma formation. 7. No acute intra-abdominal or intrapelvic traumatic injury. 8. No acute fracture or traumatic malalignment of the thoracic or lumbar spine. 9. Other imaging findings of potential clinical significance: Aortic Atherosclerosis (ICD10-I70.0) including least 4 vessel coronary artery. Status post aortic valve replacement. Total bilateral hip arthroplasty partially visualized. These results were called by telephone at the time of interpretation on 04/14/2023 at 1:25 pm to provider [facility] , who verbally acknowledged these results. Electronically Signed   By: Tish Frederickson M.D.   On: 04/14/2023 13:41   CT Head Wo Contrast  Result Date: 04/14/2023 CLINICAL DATA:  Head trauma, minor (Age >= 65y); Neck trauma (Age >= 65y). EXAM: CT HEAD WITHOUT CONTRAST CT CERVICAL SPINE WITHOUT CONTRAST TECHNIQUE: Multidetector CT imaging of the head and cervical spine was performed following the standard protocol without intravenous contrast. Multiplanar CT image reconstructions of the cervical spine were also generated. RADIATION DOSE REDUCTION: This exam was performed according to the departmental dose-optimization program which includes automated exposure control, adjustment of the mA and/or kV according to patient size and/or use of iterative reconstruction technique. COMPARISON:  None available. FINDINGS: CT HEAD FINDINGS Brain: No acute intracranial hemorrhage. Gray-white differentiation is preserved. No hydrocephalus or  extra-axial collection. No mass effect or midline shift. Vascular: No hyperdense vessel or unexpected calcification. Skull: No calvarial fracture or suspicious bone lesion. Skull base is unremarkable. Sinuses/Orbits: No acute finding. Other: Left frontotemporal scalp hematoma. Subcutaneous emphysema dissecting along the deep fascial planes of the neck to the skull base. CT CERVICAL SPINE FINDINGS Alignment: Normal. Skull base and vertebrae: No acute fracture. Normal craniocervical junction. No suspicious bone lesions. Soft tissues and spinal canal: No prevertebral fluid or swelling. No visible canal hematoma. Disc levels: Multilevel cervical spondylosis, worst at C5-6, where there is at least mild spinal canal stenosis. Upper chest: Trace left apical pneumothorax, better evaluated on same day chest CT. Extensive pneumomediastinum and subcutaneous emphysema tracking along the deep fascial planes of the neck, left-greater-than-right. Other: Atherosclerotic calcifications of the carotid bulbs. IMPRESSION: 1. No acute intracranial abnormality. 2. Left frontotemporal scalp hematoma without underlying calvarial fracture. 3. No acute cervical spine fracture or traumatic listhesis. 4. Trace left apical pneumothorax, better evaluated on same day chest CT. Extensive pneumomediastinum and subcutaneous emphysema tracking along the deep fascial planes  of the neck, left-greater-than-right. Electronically Signed   By: Orvan Falconer M.D.   On: 04/14/2023 13:16   CT Cervical Spine Wo Contrast  Result Date: 04/14/2023 CLINICAL DATA:  Head trauma, minor (Age >= 65y); Neck trauma (Age >= 65y). EXAM: CT HEAD WITHOUT CONTRAST CT CERVICAL SPINE WITHOUT CONTRAST TECHNIQUE: Multidetector CT imaging of the head and cervical spine was performed following the standard protocol without intravenous contrast. Multiplanar CT image reconstructions of the cervical spine were also generated. RADIATION DOSE REDUCTION: This exam was performed  according to the departmental dose-optimization program which includes automated exposure control, adjustment of the mA and/or kV according to patient size and/or use of iterative reconstruction technique. COMPARISON:  None available. FINDINGS: CT HEAD FINDINGS Brain: No acute intracranial hemorrhage. Gray-white differentiation is preserved. No hydrocephalus or extra-axial collection. No mass effect or midline shift. Vascular: No hyperdense vessel or unexpected calcification. Skull: No calvarial fracture or suspicious bone lesion. Skull base is unremarkable. Sinuses/Orbits: No acute finding. Other: Left frontotemporal scalp hematoma. Subcutaneous emphysema dissecting along the deep fascial planes of the neck to the skull base. CT CERVICAL SPINE FINDINGS Alignment: Normal. Skull base and vertebrae: No acute fracture. Normal craniocervical junction. No suspicious bone lesions. Soft tissues and spinal canal: No prevertebral fluid or swelling. No visible canal hematoma. Disc levels: Multilevel cervical spondylosis, worst at C5-6, where there is at least mild spinal canal stenosis. Upper chest: Trace left apical pneumothorax, better evaluated on same day chest CT. Extensive pneumomediastinum and subcutaneous emphysema tracking along the deep fascial planes of the neck, left-greater-than-right. Other: Atherosclerotic calcifications of the carotid bulbs. IMPRESSION: 1. No acute intracranial abnormality. 2. Left frontotemporal scalp hematoma without underlying calvarial fracture. 3. No acute cervical spine fracture or traumatic listhesis. 4. Trace left apical pneumothorax, better evaluated on same day chest CT. Extensive pneumomediastinum and subcutaneous emphysema tracking along the deep fascial planes of the neck, left-greater-than-right. Electronically Signed   By: Orvan Falconer M.D.   On: 04/14/2023 13:16   DG Chest Port 1 View  Result Date: 04/14/2023 CLINICAL DATA:  Status post fall EXAM: PORTABLE CHEST 1 VIEW  COMPARISON:  Chest radiograph dated 09/29/2018 FINDINGS: Extensive left chest wall and left neck subcutaneous emphysema. Left basilar patchy and dense left retrocardiac opacity. Trace left pneumothorax. Similar postsurgical cardiomediastinal silhouette. Multiple displaced left rib fractures. Median sternotomy wires are nondisplaced. IMPRESSION: 1. Multiple displaced left rib fractures with trace left pneumothorax and extensive left chest wall and left neck subcutaneous emphysema. 2. Left basilar patchy and dense left retrocardiac opacity, may reflect a combination of atelectasis and pulmonary contusion. These results were called by telephone at the time of interpretation on 04/14/2023 at 11:52 am to provider [facility] , who verbally acknowledged these results. Electronically Signed   By: Agustin Cree M.D.   On: 04/14/2023 11:52    Pending Labs Unresulted Labs (From admission, onward)    None       Vitals/Pain Today's Vitals   04/15/23 0109 04/15/23 0430 04/15/23 0657 04/15/23 0800  BP: (!) 105/56 (!) 143/64 (!) 145/65 129/61  Pulse: 78 72 71 77  Resp: 17 (!) 24 (!) 22 (!) 21  Temp: 98.5 F (36.9 C) 97.9 F (36.6 C) 98.2 F (36.8 C)   TempSrc: Oral Oral Oral   SpO2: 95% 95% 100% 96%  Weight:      Height:      PainSc: 0-No pain 3       Isolation Precautions No active isolations  Medications Medications  celecoxib (  CELEBREX) capsule 200 mg (200 mg Oral Given 04/14/23 1850)  ipratropium-albuterol (DUONEB) 0.5-2.5 (3) MG/3ML nebulizer solution 3 mL (3 mLs Nebulization Given 04/15/23 0651)  acetaminophen (TYLENOL) tablet 1,000 mg (1,000 mg Oral Given 04/15/23 0430)  methocarbamol (ROBAXIN) tablet 500 mg (500 mg Oral Given 04/15/23 0430)  oxyCODONE (Oxy IR/ROXICODONE) immediate release tablet 5-10 mg (5 mg Oral Given 04/15/23 0035)  HYDROmorphone (DILAUDID) injection 0.5-1 mg (1 mg Intravenous Given 04/14/23 1714)  guaiFENesin tablet 200 mg (200 mg Oral Given 04/15/23 0651)  lidocaine (LIDODERM) 5  % 1 patch (1 patch Transdermal Patch Removed 04/15/23 0650)  insulin aspart (novoLOG) injection 0-15 Units ( Subcutaneous Not Given 04/15/23 0825)  insulin aspart (novoLOG) injection 0-5 Units ( Subcutaneous Not Given 04/14/23 2138)  0.9 %  sodium chloride infusion ( Intravenous New Bag/Given 04/14/23 2113)  carvedilol (COREG) tablet 6.25 mg (6.25 mg Oral Given 04/15/23 0826)  famotidine (PEPCID) tablet 20 mg (20 mg Oral Given 04/14/23 2138)  sertraline (ZOLOFT) tablet 25 mg (25 mg Oral Given 04/14/23 2112)  montelukast (SINGULAIR) tablet 10 mg (10 mg Oral Given 04/14/23 2112)  heparin injection 5,000 Units (5,000 Units Subcutaneous Given 04/15/23 0651)  senna-docusate (Senokot-S) tablet 1 tablet (has no administration in time range)  ondansetron (ZOFRAN) tablet 4 mg (has no administration in time range)    Or  ondansetron (ZOFRAN) injection 4 mg (has no administration in time range)  morphine (PF) 4 MG/ML injection 4 mg (4 mg Intravenous Given 04/14/23 1037)  sodium chloride 0.9 % bolus 500 mL (0 mLs Intravenous Stopped 04/14/23 1149)  iohexol (OMNIPAQUE) 300 MG/ML solution 100 mL (100 mLs Intravenous Contrast Given 04/14/23 1208)  morphine (PF) 4 MG/ML injection 4 mg (4 mg Intravenous Given 04/14/23 1237)  morphine (PF) 4 MG/ML injection 4 mg (4 mg Intravenous Given 04/14/23 1410)  midazolam (VERSED) injection (1 mg Intravenous Given 04/14/23 1622)  fentaNYL (SUBLIMAZE) injection (50 mcg Intravenous Given 04/14/23 1622)  lidocaine (XYLOCAINE) 1 % (with pres) injection 10 mL (10 mLs Intradermal Given 04/14/23 1701)    Mobility walks     Focused Assessments Musculoskeletal   R Recommendations: See Admitting Provider Note  Report given to:   Additional Notes:

## 2023-04-16 ENCOUNTER — Inpatient Hospital Stay (HOSPITAL_COMMUNITY): Payer: PPO

## 2023-04-16 DIAGNOSIS — S2249XA Multiple fractures of ribs, unspecified side, initial encounter for closed fracture: Secondary | ICD-10-CM | POA: Diagnosis not present

## 2023-04-16 LAB — GLUCOSE, CAPILLARY
Glucose-Capillary: 206 mg/dL — ABNORMAL HIGH (ref 70–99)
Glucose-Capillary: 168 mg/dL — ABNORMAL HIGH (ref 70–99)
Glucose-Capillary: 135 mg/dL — ABNORMAL HIGH (ref 70–99)
Glucose-Capillary: 141 mg/dL — ABNORMAL HIGH (ref 70–99)

## 2023-04-16 NOTE — Progress Notes (Addendum)
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 Loreta Ave on 04/14/23   Subjective:  Patient sitting in a recliner,NAD. Pt states that she is having more pain in her shoulder than yesterday.  Breathing has been improved.  Informed CXR findings, chest tube removal. Verbal consent obtained.    Allergies: Brilinta [ticagrelor], Lisinopril, Asa [aspirin], Blue dyes (parenteral), Corn-containing products, Nsaids, Tea, Rosuvastatin, Statins, Zetia [ezetimibe], Isosulfan blue, and Other  Medications: Prior to Admission medications   Medication Sig Start Date End Date Taking? Authorizing Provider  Acetaminophen (TYLENOL DISSOLVE PACKS) 500 MG PACK Take 1,000 mg by mouth every 6 (six) hours as needed (pain).   Yes [provider]  acetaminophen (TYLENOL) 325 MG tablet Take 650 mg by mouth every 6 (six) hours as needed for moderate pain.   Yes [provider]  amoxicillin (AMOXIL) 500 MG capsule Take 4 capsules by mouth 1 hour before appointment 01/22/22  Yes   carvedilol (COREG) 6.25 MG tablet Take 1 tablet (6.25 mg total) by mouth 2 (two) times daily with a meal. 06/16/22  Yes Dyann Kief, PA-C  cetirizine (ZYRTEC) 10 MG tablet Take 25 mg by mouth daily.   Yes [provider]  COLLAGEN PO Take 1 Scoop by mouth daily. With biotin and vitamin c   Yes [provider]  Dulaglutide (TRULICITY) 1.5 MG/0.5ML SOPN Inject 1.5 mg into the skin once a week. 03/08/23  Yes   Evolocumab (REPATHA SURECLICK) 140 MG/ML SOAJ INJECT 140 MG INTO THE SKIN EVERY 14 DAYS 12/17/21  Yes Lyn Records, MD  famotidine (PEPCID) 20 MG tablet Take 20 mg by mouth at bedtime.   Yes [provider]  fluticasone (FLONASE) 50 MCG/ACT nasal spray  Place 1 spray into both nostrils daily.   Yes [provider]  glipiZIDE (GLUCOTROL XL) 10 MG 24 hr tablet TAKE 1 TABLET BY MOUTH DAILY FOR DIABETES CONTROL. 03/15/22  Yes   glipiZIDE (GLUCOTROL XL) 5 MG 24 hr tablet Take 1 tablet (5 mg total) by mouth daily for diabetes. 03/30/23  Yes   losartan (COZAAR) 50 MG tablet TAKE 1 TABLET BY MOUTH DAILY TO PRESERVE KIDNEY FUNCTION. 06/16/22  Yes Dyann Kief, PA-C  Menthol-Methyl Salicylate (MUSCLE RUB EX) Apply 1 application topically as needed (for pain).   Yes [provider]  metFORMIN (GLUCOPHAGE) 850 MG tablet Take 1 tablet (850 mg total) by mouth 2 (two) times daily to control diabetes. 03/08/23  Yes   methocarbamol (ROBAXIN) 500 MG tablet Take one tablet 4 times a day as needed for muscle 03/05/22  Yes   montelukast (SINGULAIR) 10 MG tablet Take 1 tablet by mouth daily for allergy control 03/05/22  Yes   Povidone (IVIZIA DRY EYES OP) Apply 2 drops to eye daily.   Yes [provider]  sertraline (ZOLOFT) 50 MG tablet Take 1 tablet (50 mg total) by mouth daily to control anxiety. Patient taking differently: Take 25 mg by mouth daily. 09/10/22  Yes   celecoxib (CELEBREX) 200 MG capsule Take 1 capsule (200 mg total) by mouth daily as needed. 03/08/23     glucose blood (ONETOUCH ULTRA) test strip TEST BLOOD SUGAR ONCE A DAY 12/11/17   [provider]  prasugrel (EFFIENT)  10 MG TABS tablet Take 1 tablet (10 mg total) by mouth daily. 07/22/22   Lyn Records, MD     Vital Signs: BP (!) 117/49 (BP Location: Right Arm)   Pulse 67   Temp 97.8 F (36.6 C) (Oral)   Resp 20   Ht 5\' 2"  (1.575 m)   Wt 182 lb (82.6 kg)   SpO2 95%   BMI 33.29 kg/m   Physical Exam Vitals reviewed.  Constitutional:      General: She is not in acute distress.    Appearance: She is not ill-appearing.  HENT:     Head: Normocephalic and atraumatic.  Eyes:     Extraocular Movements: Extraocular movements intact.  Pulmonary:     Effort:  Pulmonary effort is normal.     Comments: Left sided chest tube in place. Site clean and dry.  Abdominal:     General: Abdomen is flat.     Palpations: Abdomen is soft.  Musculoskeletal:     Cervical back: Neck supple.  Skin:    General: Skin is warm and dry.     Coloration: Skin is not jaundiced or pale.  Neurological:     Mental Status: She is alert.  Psychiatric:        Mood and Affect: Mood normal.        Behavior: Behavior normal.     Imaging: DG Chest Port 1 View  Result Date: 04/16/2023 CLINICAL DATA:  67 year old female status post fall with displaced left rib fractures, left pneumothorax, pulmonary contusion. Chest tube placed. EXAM: PORTABLE CHEST 1 VIEW COMPARISON:  Portable chest 04/15/2023 and earlier. FINDINGS: Portable AP semi upright view at 0528 hours. Stable pigtail type left chest tube since yesterday. Left chest wall and supraclavicular soft tissue gas appears stable to slightly regressed. No residual pneumothorax is identified. Superimposed pre-existing postoperative changes in the left upper lung and hilum from previous partial pneumonectomy. Mildly improved left lung base ventilation. Stable cardiac size and mediastinal contours. Prior CABG. Right lung appears negative. Multiple displaced left rib fractures again noted. Negative visible bowel gas. IMPRESSION: 1. Stable left chest tube and no residual pneumothorax identified. 2. Mildly improved left lung base ventilation. Stable to regressed left chest wall and supraclavicular gas. 3. Displaced left rib fractures. Previous partial left lung resection. No new cardiopulmonary abnormality. Electronically Signed   By: Odessa Fleming M.D.   On: 04/16/2023 06:30   CT GUIDED VISCERAL FLUID DRAIN BY PERC CATH  Result Date: 04/15/2023 INDICATION: 67 year old female referred for left chest tube placement for pneumothorax. EXAM: CT GUIDED LEFT CHEST TUBE PLACEMENT MEDICATIONS: None ANESTHESIA/SEDATION: 1.0 mg IV Versed 50 mcg IV Fentanyl  Moderate Sedation Time:  27 minute The patient was continuously monitored during the procedure by the interventional radiology nurse under my direct supervision. COMPLICATIONS: Six none TECHNIQUE: Informed written consent was obtained from the patient after a thorough discussion of the procedural risks, benefits and alternatives. All questions were addressed. Maximal Sterile Barrier Technique was utilized including caps, mask, sterile gowns, sterile gloves, sterile drape, hand hygiene and skin antiseptic. A timeout was performed prior to the initiation of the procedure. PROCEDURE: Patient was positioned supine on the CT gantry table. Left arm was extended above the head. Scout images were acquired. The left chest wall was prepped with chlorhexidine in a sterile fashion, and a sterile drape was applied covering the operative field. A sterile gown and sterile gloves were used for the procedure. Local anesthesia was provided with 1% Lidocaine. Once  the patient was anesthetized, a trocar needle was advanced with CT guidance into the pneumothorax at the base of the left chest. Once we confirmed needle tip position modified Seldinger technique was used to place a 10 [place] drain. Drain was attached to the pleura vac unit to confirm function and a final CT was acquired. Patient tolerated the procedure well and remained hemodynamically stable throughout. No complications were encountered and no significant blood loss. FINDINGS: Ten French drain into the left chest for treatment pneumothorax. A chest tube function was confirmed with improved pneumothorax component after placement. IMPRESSION: Status post CT-guided left chest tube placement. Signed, Yvone Neu. Miachel Roux, RPVI [facility] Electronically Signed   By: Gilmer Mor D.O.   On: 04/15/2023 12:26   DG Chest Port 1 View  Result Date: 04/15/2023 CLINICAL DATA:  Pneumothorax.  Chest tube placed  yesterday. EXAM: PORTABLE CHEST 1 VIEW COMPARISON:  One-view chest x-ray 04/14/2023 FINDINGS: The heart is enlarged. Median sternotomy noted. Aortic valve replacement is present. Atherosclerotic changes are present at the aortic arch. A left-sided chest tube is in place. A tiny apical pneumothorax is present. Subcutaneous emphysema is present over the left chest wall and neck. Increasing left pleural effusion and basilar airspace opacity is present. The right lung is clear. Lung volumes are low. IMPRESSION: 1. Tiny left apical pneumothorax with left-sided chest tube in place. 2. Increasing left pleural effusion and basilar airspace opacity likely reflects atelectasis. Infection is not excluded. 3. Cardiomegaly without failure. Electronically Signed   By: Marin Roberts M.D.   On: 04/15/2023 08:26   CT CHEST ABDOMEN PELVIS W CONTRAST  Result Date: 04/14/2023 CLINICAL DATA:  Polytrauma, blunt. Fall. Prior left upper lobe malignancy. EXAM: CT CHEST, ABDOMEN, AND PELVIS WITH CONTRAST TECHNIQUE: Multidetector CT imaging of the chest, abdomen and pelvis was performed following the standard protocol during bolus administration of intravenous contrast. RADIATION DOSE REDUCTION: This exam was performed according to the departmental dose-optimization program which includes automated exposure control, adjustment of the mA and/or kV according to patient size and/or use of iterative reconstruction technique. CONTRAST:  OMNIPAQUE IOHEXOL 300 MG/ML  SOLN COMPARISON:  Chest x-ray 04/14/2023, pet CT 06/29/2018 FINDINGS: CHEST: Cardiovascular: No aortic injury. The thoracic aorta is normal in caliber. The heart is normal in size. No significant pericardial effusion. Severe atherosclerotic plaque. Aortic valve replacement. Four-vessel coronary calcification. Mediastinum/Nodes: Moderate volume pneumomediastinum.  No mediastinal hematoma. The esophagus is unremarkable. The thyroid is unremarkable. The central airways are  patent. No mediastinal, hilar, or axillary lymphadenopathy. Lungs/Pleura: Left lower lobe pulmonary contusion. Left upper lobe surgical changes and scarring from wedge resection. No pulmonary nodule. No pulmonary mass. No definite pulmonary laceration. No pneumatocele formation. No pleural effusion. At least small volume loculated left lower pneumothorax. No hemothorax. Musculoskeletal/Chest wall: Extensive subcutaneus soft tissue emphysema along the left chest wall, left back, bilateral neck. Acute full-shaft wdith displaced anterolateral left 4-8 rib fractures. Acute nondisplaced posterior left 5-6 rib fractures. No sternal fracture. No scapular fracture. No clavicular fracture. Intact sternotomy wires. No spinal fracture. ABDOMEN / PELVIS: Hepatobiliary: Not enlarged. No focal lesion. No laceration or subcapsular hematoma. The gallbladder is otherwise unremarkable with no radio-opaque gallstones. No biliary ductal dilatation. Pancreas: Normal pancreatic contour. No main pancreatic duct dilatation. Spleen: Not enlarged. No focal lesion. No laceration, subcapsular hematoma, or vascular injury. Adrenals/Urinary Tract: No nodularity bilaterally. Bilateral kidneys enhance symmetrically. No hydronephrosis. No contusion, laceration, or subcapsular hematoma. No injury to the vascular  structures or collecting systems. No hydroureter. The urinary bladder is grossly unremarkable with limited evaluation due to streak artifact originating from bilateral femoral surgical hardware. On delayed imaging, there is no urothelial wall thickening and there are no filling defects in the opacified portions of the bilateral collecting systems or ureters. Stomach/Bowel: No small or large bowel wall thickening or dilatation. The appendix is unremarkable. Vasculature/Lymphatics: Severe atherosclerotic plaque. No abdominal aorta or iliac aneurysm. No active contrast extravasation or pseudoaneurysm. No abdominal, pelvic, inguinal  lymphadenopathy. Reproductive: Coarsened uterine lesions suggestive of degenerative uterine fibroids. Uterus and bilateral adnexal regions are otherwise unremarkable. Slightly limited evaluation due to streak artifact originating from bilateral hip surgical hardware. Other: No simple free fluid ascites. No pneumoperitoneum. No hemoperitoneum. No mesenteric hematoma identified. No organized fluid collection. Musculoskeletal: Left hip film flank subcutaneus soft tissue hematoma formation. Left chest subcutaneus soft tissue emphysema extends inferiorly along the left flank soft tissues and anterior abdominal soft tissue/groin. Multilevel degenerative changes spine with intervertebral disc space vacuum phenomenon and endplate sclerosis at the L2-L3 level. Grade 1 anterolisthesis of L4 on L5. No acute pelvic fracture. No spinal fracture. Partially visualized total bilateral hip arthroplasty. No CT findings to suggest surgical hardware complication. Ports and Devices: None. IMPRESSION: 1. At least small volume loculated left lower pneumothorax. 2. Moderate volume pneumomediastinum. Finding likely due to trauma in a patient status post left upper lobe wedge resection and surgical changes. No definite other findings of esophageal perforation. 3. Left lower lobe pulmonary contusion. 4. Extensive subcutaneus soft tissue emphysema of the left anterior chest, abdomen, back, neck (bilateral neck). 5. Acute full-shaft width displaced left anterolateral 4-8 rib fractures. Acute nondisplaced posterior left 5-6 rib fractures. No flail chest. 6. Left hip film flank subcutaneus soft tissue hematoma formation. 7. No acute intra-abdominal or intrapelvic traumatic injury. 8. No acute fracture or traumatic malalignment of the thoracic or lumbar spine. 9. Other imaging findings of potential clinical significance: Aortic Atherosclerosis (ICD10-I70.0) including least 4 vessel coronary artery. Status post aortic valve replacement. Total  bilateral hip arthroplasty partially visualized. These results were called by telephone at the time of interpretation on 04/14/2023 at 1:25 pm to provider [facility] , who verbally acknowledged these results. Electronically Signed   By: Tish Frederickson M.D.   On: 04/14/2023 13:41   CT Head Wo Contrast  Result Date: 04/14/2023 CLINICAL DATA:  Head trauma, minor (Age >= 65y); Neck trauma (Age >= 65y). EXAM: CT HEAD WITHOUT CONTRAST CT CERVICAL SPINE WITHOUT CONTRAST TECHNIQUE: Multidetector CT imaging of the head and cervical spine was performed following the standard protocol without intravenous contrast. Multiplanar CT image reconstructions of the cervical spine were also generated. RADIATION DOSE REDUCTION: This exam was performed according to the departmental dose-optimization program which includes automated exposure control, adjustment of the mA and/or kV according to patient size and/or use of iterative reconstruction technique. COMPARISON:  None available. FINDINGS: CT HEAD FINDINGS Brain: No acute intracranial hemorrhage. Gray-white differentiation is preserved. No hydrocephalus or extra-axial collection. No mass effect or midline shift. Vascular: No hyperdense vessel or unexpected calcification. Skull: No calvarial fracture or suspicious bone lesion. Skull base is unremarkable. Sinuses/Orbits: No acute finding. Other: Left frontotemporal scalp hematoma. Subcutaneous emphysema dissecting along the deep fascial planes of the neck to the skull base. CT CERVICAL SPINE FINDINGS Alignment: Normal. Skull base and vertebrae: No acute fracture. Normal craniocervical junction. No suspicious bone lesions. Soft tissues and spinal canal: No prevertebral fluid or swelling. No visible canal hematoma. Disc levels: Multilevel  cervical spondylosis, worst at C5-6, where there is at least mild spinal canal stenosis. Upper chest: Trace left apical pneumothorax, better evaluated on same day chest CT. Extensive  pneumomediastinum and subcutaneous emphysema tracking along the deep fascial planes of the neck, left-greater-than-right. Other: Atherosclerotic calcifications of the carotid bulbs. IMPRESSION: 1. No acute intracranial abnormality. 2. Left frontotemporal scalp hematoma without underlying calvarial fracture. 3. No acute cervical spine fracture or traumatic listhesis. 4. Trace left apical pneumothorax, better evaluated on same day chest CT. Extensive pneumomediastinum and subcutaneous emphysema tracking along the deep fascial planes of the neck, left-greater-than-right. Electronically Signed   By: Orvan Falconer M.D.   On: 04/14/2023 13:16   CT Cervical Spine Wo Contrast  Result Date: 04/14/2023 CLINICAL DATA:  Head trauma, minor (Age >= 65y); Neck trauma (Age >= 65y). EXAM: CT HEAD WITHOUT CONTRAST CT CERVICAL SPINE WITHOUT CONTRAST TECHNIQUE: Multidetector CT imaging of the head and cervical spine was performed following the standard protocol without intravenous contrast. Multiplanar CT image reconstructions of the cervical spine were also generated. RADIATION DOSE REDUCTION: This exam was performed according to the departmental dose-optimization program which includes automated exposure control, adjustment of the mA and/or kV according to patient size and/or use of iterative reconstruction technique. COMPARISON:  None available. FINDINGS: CT HEAD FINDINGS Brain: No acute intracranial hemorrhage. Gray-white differentiation is preserved. No hydrocephalus or extra-axial collection. No mass effect or midline shift. Vascular: No hyperdense vessel or unexpected calcification. Skull: No calvarial fracture or suspicious bone lesion. Skull base is unremarkable. Sinuses/Orbits: No acute finding. Other: Left frontotemporal scalp hematoma. Subcutaneous emphysema dissecting along the deep fascial planes of the neck to the skull base. CT CERVICAL SPINE FINDINGS Alignment: Normal. Skull base and vertebrae: No acute fracture.  Normal craniocervical junction. No suspicious bone lesions. Soft tissues and spinal canal: No prevertebral fluid or swelling. No visible canal hematoma. Disc levels: Multilevel cervical spondylosis, worst at C5-6, where there is at least mild spinal canal stenosis. Upper chest: Trace left apical pneumothorax, better evaluated on same day chest CT. Extensive pneumomediastinum and subcutaneous emphysema tracking along the deep fascial planes of the neck, left-greater-than-right. Other: Atherosclerotic calcifications of the carotid bulbs. IMPRESSION: 1. No acute intracranial abnormality. 2. Left frontotemporal scalp hematoma without underlying calvarial fracture. 3. No acute cervical spine fracture or traumatic listhesis. 4. Trace left apical pneumothorax, better evaluated on same day chest CT. Extensive pneumomediastinum and subcutaneous emphysema tracking along the deep fascial planes of the neck, left-greater-than-right. Electronically Signed   By: Orvan Falconer M.D.   On: 04/14/2023 13:16   DG Chest Port 1 View  Result Date: 04/14/2023 CLINICAL DATA:  Status post fall EXAM: PORTABLE CHEST 1 VIEW COMPARISON:  Chest radiograph dated 09/29/2018 FINDINGS: Extensive left chest wall and left neck subcutaneous emphysema. Left basilar patchy and dense left retrocardiac opacity. Trace left pneumothorax. Similar postsurgical cardiomediastinal silhouette. Multiple displaced left rib fractures. Median sternotomy wires are nondisplaced. IMPRESSION: 1. Multiple displaced left rib fractures with trace left pneumothorax and extensive left chest wall and left neck subcutaneous emphysema. 2. Left basilar patchy and dense left retrocardiac opacity, may reflect a combination of atelectasis and pulmonary contusion. These results were called by telephone at the time of interpretation on 04/14/2023 at 11:52 am to provider [facility] , who verbally acknowledged these results. Electronically Signed   By: Agustin Cree M.D.   On:  04/14/2023 11:52    Labs:  CBC: Recent Labs    08/06/22 0927 04/14/23 1020 04/15/23 0227  WBC 11.8*  12.9* 9.1  HGB 14.2 12.8 12.4  HCT 42.8 37.7 38.2  PLT 227 205 177    COAGS: Recent Labs    04/14/23 1020  INR 1.1    BMP: Recent Labs    08/06/22 0927 04/14/23 1020 04/15/23 0227 04/16/23 0518  NA 139 131* 136 134*  K 4.2 4.3 4.1 3.9  CL 103 96* 103 104  CO2 30 23 24  20*  GLUCOSE 155* 193* 165* 187*  BUN 15 31* 29* 21  CALCIUM 9.6 8.7* 8.3* 8.1*  CREATININE 0.98 1.25* 1.20* 1.01*  GFRNONAA >60 48* 50* >60    LIVER FUNCTION TESTS: Recent Labs    08/06/22 0927 04/14/23 1020  BILITOT 0.7 1.3*  AST 11* 27  ALT 13 21  ALKPHOS 85 60  PROT 7.7 7.1  ALBUMIN 4.3 3.9    Assessment and Plan:  67 y.o. female with MI, DM, CAD, OA, LUL lung CA s/p wedge resection 5 years ago, fall on 8/6, CT showed left rib fx,  left PTX and pneumomediastinum, s/p left chest tube placement by Dr. Loreta Ave on 04/14/23.   CXR this morning showed no residual pneumothorax.  Chest tube placed on water seal this AM Repeat CXR at 1400 hrs reviewed with Dr. Lowella Dandy, OK to remove the chest tube.   Chest tube removed w/o difficulty.  Vaseline gauze placed, to remain for 24 hrs at least- RN notified.   Repeat CXR in AM.   Further treatment plan per Trauma team/TR[facility] Appreciate and agree with the plan.  Please call IR for questions and concerns.    Electronically Signed: Willette Brace, PA-C 04/16/2023, 2:00 PM   I spent a total of 15 Minutes at the the patient's bedside AND on the patient's hospital floor or unit, greater than 50% of which was counseling/coordinating care for Left chest tube f/u.   This chart was dictated using voice recognition software.  Despite best efforts to proofread,  errors can occur which can change the documentation meaning.

## 2023-04-16 NOTE — Evaluation (Signed)
Occupational Therapy Evaluation Patient Details Name: Sarah Stevenson MRN: [id number] DOB: 08/11/1956 Today's Date: 04/16/2023   History of Present Illness Pt is 67 yo female who presents on 04/14/23 after falling down steps when letting her dog out. Hit head with + LOC. Pt with L rib fxs, pneumomediastinum, and small pneumothorax (CT placed). PMH: CAD sp CABG, AS sp AVR, HLD, depression, T2DM, history of lung cancer, B THA   Clinical Impression   Prior to this admission, patient was living alone, a retired Engineer, civil (consulting), and active in her garden and yard, and took care of her grandchildren 1-2x per week. Patient was fully independent and driving. Currently, patient presenting with L rib pain, need for 1L O2 to maintain saturations above 90% when ambulating, and decreased activity tolerance. Patient with contact guard assist for mobility and ADLs, with minimal education provided with regard to L rib pain management. OT will continue to follow acutely, however will not need OT follow up at discharge.       If plan is discharge home, recommend the following: A little help with walking and/or transfers;A little help with bathing/dressing/bathroom;Assist for transportation    Functional Status Assessment  Patient has had a recent decline in their functional status and demonstrates the ability to make significant improvements in function in a reasonable and predictable amount of time.  Equipment Recommendations  None recommended by OT    Recommendations for Other Services       Precautions / Restrictions Precautions Precautions: Fall Precaution Comments: L chest tube, watch sats Restrictions Weight Bearing Restrictions: No      Mobility Bed Mobility Overal bed mobility: Needs Assistance Bed Mobility: Supine to Sit     Supine to sit: Contact guard     General bed mobility comments: discussed mgmt of rib pain with bed mobility    Transfers Overall transfer level: Needs  assistance Equipment used: 1 person hand held assist Transfers: Sit to/from Stand Sit to Stand: Contact guard assist           General transfer comment: contact gaurd assist, able to ambulate around bed without issue, sats maintaining on 1L      Balance Overall balance assessment: Mild deficits observed, not formally tested                                         ADL either performed or assessed with clinical judgement   ADL Overall ADL's : Needs assistance/impaired Eating/Feeding: Set up;Sitting   Grooming: Set up;Sitting   Upper Body Bathing: Sitting;Set up   Lower Body Bathing: Minimal assistance;Sit to/from stand;Sitting/lateral leans Lower Body Bathing Details (indicate cue type and reason): due to L rib fractures Upper Body Dressing : Set up;Sitting   Lower Body Dressing: Minimal assistance;Sitting/lateral leans Lower Body Dressing Details (indicate cue type and reason): due to L rib fractures Toilet Transfer: Contact guard assist;Ambulation   Toileting- Clothing Manipulation and Hygiene: Contact guard assist;Sit to/from stand;Sitting/lateral lean       Functional mobility during ADLs: Contact guard assist General ADL Comments: Patient presenting with L rib pain, need for 1L O2 to maintain saturations above 90% when ambulating, and decreased activity tolerance.     Vision Baseline Vision/History: 0 No visual deficits Ability to See in Adequate Light: 0 Adequate Patient Visual Report: No change from baseline Vision Assessment?: Yes Eye Alignment: Within Functional Limits Ocular Range of Motion: Within Functional Limits  Alignment/Gaze Preference: Within Defined Limits Tracking/Visual Pursuits: Able to track stimulus in all quads without difficulty Visual Fields: No apparent deficits Additional Comments: had initial double vision but has since resolved     Perception Perception: Not tested       Praxis Praxis: Not tested        Pertinent Vitals/Pain Pain Assessment Pain Assessment: Faces Faces Pain Scale: Hurts little more Pain Location: L side with mvmt Pain Descriptors / Indicators: Discomfort, Grimacing, Guarding Pain Intervention(s): Limited activity within patient's tolerance, Monitored during session, Repositioned     Extremity/Trunk Assessment Upper Extremity Assessment Upper Extremity Assessment: Overall WFL for tasks assessed   Lower Extremity Assessment Lower Extremity Assessment: Defer to PT evaluation   Cervical / Trunk Assessment Cervical / Trunk Assessment: Normal   Communication Communication Communication: No apparent difficulties   Cognition Arousal: Alert Behavior During Therapy: WFL for tasks assessed/performed Overall Cognitive Status: Within Functional Limits for tasks assessed                                       General Comments       Exercises     Shoulder Instructions      Home Living Family/patient expects to be discharged to:: Private residence Living Arrangements: Alone Available Help at Discharge: Family;Available PRN/intermittently Type of Home: House Home Access: Stairs to enter Entrance Stairs-Number of Steps: 1 Entrance Stairs-Rails: Right Home Layout: One level     Bathroom Shower/Tub: Chief Strategy Officer: Handicapped height     Home Equipment: Agricultural consultant (2 wheels);Shower seat;Rollator (4 wheels);Cane - single point          Prior Functioning/Environment Prior Level of Function : Independent/Modified Independent;Driving             Mobility Comments: retired Charity fundraiser, independent, drives ADLs Comments: independent        OT Problem List: Decreased activity tolerance;Pain      OT Treatment/Interventions: Balance training;Patient/family education;Therapeutic activities;Self-care/ADL training;Therapeutic exercise;Energy conservation    OT Goals(Current goals can be found in the care plan section) Acute  Rehab OT Goals Patient Stated Goal: to be able to walk to the bathroom independently OT Goal Formulation: With patient Time For Goal Achievement: 04/30/23 Potential to Achieve Goals: Good  OT Frequency: Min 1X/week    Co-evaluation              AM-PAC OT "6 Clicks" Daily Activity     Outcome Measure Help from another person eating meals?: None Help from another person taking care of personal grooming?: None Help from another person toileting, which includes using toliet, bedpan, or urinal?: A Little Help from another person bathing (including washing, rinsing, drying)?: A Little Help from another person to put on and taking off regular upper body clothing?: None Help from another person to put on and taking off regular lower body clothing?: A Little 6 Click Score: 21   End of Session Equipment Utilized During Treatment: Oxygen Nurse Communication: Mobility status  Activity Tolerance: Patient tolerated treatment well Patient left: in chair;with call bell/phone within reach  OT Visit Diagnosis: Unsteadiness on feet (R26.81);Other abnormalities of gait and mobility (R26.89);Pain Pain - Right/Left: Left Pain - part of body:  (Ribs)                Time: 9629-5284 OT Time Calculation (min): 23 min Charges:  OT General Charges $OT Visit: 1 Visit OT  Evaluation $OT Eval Moderate Complexity: 1 Mod OT Treatments $Self Care/Home Management : 8-22 mins  Pollyann Glen E. , OTR/L Acute Rehabilitation Services [phone]   Cherlyn Cushing 04/16/2023, 12:47 PM

## 2023-04-16 NOTE — Progress Notes (Signed)
PROGRESS NOTE    Sarah Stevenson  VQQ:[id number] DOB: 20-Mar-1956 DOA: 04/14/2023 PCP: Barbie Banner, MD     Brief Narrative:  Sarah Stevenson is a 67 year old female with past medical history of CAD sp CABG, AS sp AVR, HLD, depression, T2DM, history of lung cancer.  Patient has a dog that she states wakes her up at times of night.  She believes this is what happened, resulting in her falling while going down steps on her deck.  Patient hit her head, suffered LOC. She woke at approximately 10 AM yesterday morning.  She does not recall the events from the night.  She had left-sided chest pain that persisted and worsened.  She endorses shortness of breath.  Today she called her daughter-in-law who brought her to the ER.  Patient maintained on Effient, her last dose was Monday.   In the ER patient's vitals were stable afebrile, HR 76.  Imaging showed to scalp hematoma, approximately 5 left-sided rib fractures, pneumomediastinum, small loculated pneumothorax, along with other findings.  Pigtail catheter placed for pneumothorax by IR.  Trauma service consulted.  New events last 24 hours / Subjective: Patient sitting in recliner. Working on IS and flutter valve. Admits to cough.   Assessment & Plan:   Principal Problem:   Multiple rib fractures involving four or more ribs Active Problems:   Hyperlipidemia   Left pulmonary contusion   Subcutaneous emphysema due to trauma, initial encounter (HCC)   Fall at home, initial encounter   Pneumomediastinum (HCC)   Pneumothorax   AKI (acute kidney injury) (HCC)   Hyponatremia   T2DM (type 2 diabetes mellitus) (HCC)   Fall with injury Extensive pneumomediastinum Multiple left-sided rib fractures Left lower pneumothorax -Pigtail catheter placed by IR, IR managing -Trauma surgery consulted  -Incentive spirometry, flutter valve -PT OT without recs at discharge   AKI, ruled out -Baseline creatinine 0.98  CAD -Coreg -Effient on hold  Diabetes  mellitus, well-controlled -A1c 5.9 -Sliding scale insulin  Hyperlipidemia -Repatha PTA   Depression -Zoloft  Aortic stenosis -Status post bioprosthetic AVR/CABG  Hyponatremia -Resolved   DVT prophylaxis:  heparin injection 5,000 Units Start: 04/15/23 0600  Code Status: Full code Family Communication: None at bedside Disposition Plan: Home Status is: Inpatient Remains inpatient appropriate because: Chest tube in place    Antimicrobials:  Anti-infectives (From admission, onward)    None        Objective: Vitals:   04/15/23 2318 04/16/23 0309 04/16/23 0745 04/16/23 1152  BP: 138/63 138/84 (!) 148/60 (!) 117/49  Pulse: 67 75 67 67  Resp: (!) 22 16 (!) 22 20  Temp: 98.2 F (36.8 C) 98 F (36.7 C) 97.8 F (36.6 C) 97.8 F (36.6 C)  TempSrc: Oral Oral Oral Oral  SpO2: 93% 91% 96% 95%  Weight:      Height:        Intake/Output Summary (Last 24 hours) at 04/16/2023 1228 Last data filed at 04/16/2023 0745 Gross per 24 hour  Intake 2020.05 ml  Output 90 ml  Net 1930.05 ml   Filed Weights   04/14/23 1005  Weight: 82.6 kg    Examination:  General exam: Appears calm and comfortable  Respiratory system: Clear to auscultation. Respiratory effort normal. No respiratory distress. No conversational dyspnea.  On nasal cannula O2.  Chest tube in place Cardiovascular system: S1 & S2 heard, RRR. No murmurs. No pedal edema. Gastrointestinal system: Abdomen is nondistended, soft and nontender. Normal bowel sounds heard. Central nervous system: Alert  and oriented. No focal neurological deficits. Speech clear.  Extremities: Symmetric in appearance  Skin: No rashes, lesions or ulcers on exposed skin  Psychiatry: Judgement and insight appear normal. Mood & affect appropriate.   Data Reviewed: I have personally reviewed following labs and imaging studies  CBC: Recent Labs  Lab 04/14/23 1020 04/15/23 0227  WBC 12.9* 9.1  NEUTROABS 9.8*  --   HGB 12.8 12.4  HCT 37.7  38.2  MCV 91.3 94.6  PLT 205 177   Basic Metabolic Panel: Recent Labs  Lab 04/14/23 1020 04/15/23 0227 04/16/23 0518  NA 131* 136 134*  K 4.3 4.1 3.9  CL 96* 103 104  CO2 23 24 20*  GLUCOSE 193* 165* 187*  BUN 31* 29* 21  CREATININE 1.25* 1.20* 1.01*  CALCIUM 8.7* 8.3* 8.1*   GFR: Estimated Creatinine Clearance: 54.6 mL/min (A) (by C-G formula based on SCr of 1.01 mg/dL (H)). Liver Function Tests: Recent Labs  Lab 04/14/23 1020  AST 27  ALT 21  ALKPHOS 60  BILITOT 1.3*  PROT 7.1  ALBUMIN 3.9   No results for input(s): "LIPASE", "AMYLASE" in the last 168 hours. No results for input(s): "AMMONIA" in the last 168 hours. Coagulation Profile: Recent Labs  Lab 04/14/23 1020  INR 1.1   Cardiac Enzymes: No results for input(s): "CKTOTAL", "CKMB", "CKMBINDEX", "TROPONINI" in the last 168 hours. BNP (last 3 results) No results for input(s): "PROBNP" in the last 8760 hours. HbA1C: Recent Labs    04/15/23 0227  HGBA1C 5.9*   CBG: Recent Labs  Lab 04/15/23 0821 04/15/23 1140 04/15/23 2104 04/16/23 0742 04/16/23 1150  GLUCAP 126* 139* 155* 206* 168*   Lipid Profile: No results for input(s): "CHOL", "HDL", "LDLCALC", "TRIG", "CHOLHDL", "LDLDIRECT" in the last 72 hours. Thyroid Function Tests: No results for input(s): "TSH", "T4TOTAL", "FREET4", "T3FREE", "THYROIDAB" in the last 72 hours. Anemia Panel: No results for input(s): "VITAMINB12", "FOLATE", "FERRITIN", "TIBC", "IRON", "RETICCTPCT" in the last 72 hours. Sepsis Labs: No results for input(s): "PROCALCITON", "LATICACIDVEN" in the last 168 hours.  No results found for this or any previous visit (from the past 240 hour(s)).    Radiology Studies: DG Chest Port 1 View  Result Date: 04/16/2023 CLINICAL DATA:  67 year old female status post fall with displaced left rib fractures, left pneumothorax, pulmonary contusion. Chest tube placed. EXAM: PORTABLE CHEST 1 VIEW COMPARISON:  Portable chest 04/15/2023 and  earlier. FINDINGS: Portable AP semi upright view at 0528 hours. Stable pigtail type left chest tube since yesterday. Left chest wall and supraclavicular soft tissue gas appears stable to slightly regressed. No residual pneumothorax is identified. Superimposed pre-existing postoperative changes in the left upper lung and hilum from previous partial pneumonectomy. Mildly improved left lung base ventilation. Stable cardiac size and mediastinal contours. Prior CABG. Right lung appears negative. Multiple displaced left rib fractures again noted. Negative visible bowel gas. IMPRESSION: 1. Stable left chest tube and no residual pneumothorax identified. 2. Mildly improved left lung base ventilation. Stable to regressed left chest wall and supraclavicular gas. 3. Displaced left rib fractures. Previous partial left lung resection. No new cardiopulmonary abnormality. Electronically Signed   By: Odessa Fleming M.D.   On: 04/16/2023 06:30   CT GUIDED VISCERAL FLUID DRAIN BY PERC CATH  Result Date: 04/15/2023 INDICATION: 67 year old female referred for left chest tube placement for pneumothorax. EXAM: CT GUIDED LEFT CHEST TUBE PLACEMENT MEDICATIONS: None ANESTHESIA/SEDATION: 1.0 mg IV Versed 50 mcg IV Fentanyl Moderate Sedation Time:  27 minute The patient was continuously  monitored during the procedure by the interventional radiology nurse under my direct supervision. COMPLICATIONS: Six none TECHNIQUE: Informed written consent was obtained from the patient after a thorough discussion of the procedural risks, benefits and alternatives. All questions were addressed. Maximal Sterile Barrier Technique was utilized including caps, mask, sterile gowns, sterile gloves, sterile drape, hand hygiene and skin antiseptic. A timeout was performed prior to the initiation of the procedure. PROCEDURE: Patient was positioned supine on the CT gantry table. Left arm was extended above the head. Scout images were acquired. The left chest wall was  prepped with chlorhexidine in a sterile fashion, and a sterile drape was applied covering the operative field. A sterile gown and sterile gloves were used for the procedure. Local anesthesia was provided with 1% Lidocaine. Once the patient was anesthetized, a trocar needle was advanced with CT guidance into the pneumothorax at the base of the left chest. Once we confirmed needle tip position modified Seldinger technique was used to place a 10 [place] drain. Drain was attached to the pleura vac unit to confirm function and a final CT was acquired. Patient tolerated the procedure well and remained hemodynamically stable throughout. No complications were encountered and no significant blood loss. FINDINGS: Ten French drain into the left chest for treatment pneumothorax. A chest tube function was confirmed with improved pneumothorax component after placement. IMPRESSION: Status post CT-guided left chest tube placement. Signed, Yvone Neu. Miachel Roux, RPVI [facility] Radiology Electronically Signed   By: Gilmer Mor D.O.   On: 04/15/2023 12:26   DG Chest Port 1 View  Result Date: 04/15/2023 CLINICAL DATA:  Pneumothorax.  Chest tube placed yesterday. EXAM: PORTABLE CHEST 1 VIEW COMPARISON:  One-view chest x-ray 04/14/2023 FINDINGS: The heart is enlarged. Median sternotomy noted. Aortic valve replacement is present. Atherosclerotic changes are present at the aortic arch. A left-sided chest tube is in place. A tiny apical pneumothorax is present. Subcutaneous emphysema is present over the left chest wall and neck. Increasing left pleural effusion and basilar airspace opacity is present. The right lung is clear. Lung volumes are low. IMPRESSION: 1. Tiny left apical pneumothorax with left-sided chest tube in place. 2. Increasing left pleural effusion and basilar airspace opacity likely reflects atelectasis. Infection is not excluded. 3. Cardiomegaly without failure.  Electronically Signed   By: Marin Roberts M.D.   On: 04/15/2023 08:26      Scheduled Meds:  acetaminophen  1,000 mg Oral Q6H   carvedilol  6.25 mg Oral BID WC   celecoxib  200 mg Oral Daily   famotidine  20 mg Oral QHS   guaiFENesin  200 mg Oral Q6H   heparin  5,000 Units Subcutaneous Q8H   insulin aspart  0-15 Units Subcutaneous TID WC   insulin aspart  0-5 Units Subcutaneous QHS   lidocaine  1 patch Transdermal Q24H   methocarbamol  500 mg Oral Q6H   montelukast  10 mg Oral Daily   sertraline  25 mg Oral Daily   Continuous Infusions:     LOS: 2 days   Time spent: 25 minutes   Noralee Stain, DO [facility] 04/16/2023, 12:28 PM   Available via Epic secure chat 7am-7pm After these hours, please refer to coverage provider listed on [email or web address]

## 2023-04-16 NOTE — Plan of Care (Signed)
  Problem: Education: Goal: Ability to describe self-care measures that may prevent or decrease complications (Diabetes Survival Skills Education) will improve Outcome: Progressing   

## 2023-04-16 NOTE — TOC CM/SW Note (Signed)
Transition of Care [facility]) - Inpatient Brief Assessment   Patient Details  Name: Sarah Stevenson MRN: [id number] Date of Birth: Aug 08, 1956  Transition of Care [facility]) CM/SW Contact:    Darrold Span, RN Phone Number: 04/16/2023, 12:06 PM   Clinical Narrative: From home s/p fall with Pntx- chest tube place. 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 We will continue to monitor patient advancement through interdisciplinary progression rounds. If new patient transition needs arise, please place a TOC consult.   Transition of Care Asessment: Insurance and Status: Insurance coverage has been reviewed Patient has primary care physician: Yes Home environment has been reviewed: home Prior level of function:: self/independent Prior/Current Home Services: No current home services Social Determinants of Health Reivew: SDOH reviewed no interventions necessary Readmission risk has been reviewed: Yes Transition of care needs: no transition of care needs at this time

## 2023-04-16 NOTE — Progress Notes (Signed)
Physical Therapy Treatment Patient Details Name: Sarah Stevenson MRN: [id number] DOB: 07-16-1956 Today's Date: 04/16/2023   History of Present Illness Pt is 67 yo female who presents on 04/14/23 after falling down steps when letting her dog out. Hit head with + LOC. Pt with L rib fxs, pneumomediastinum, and small pneumothorax (CT placed). Chest tube removed 8/9. PMH: CAD sp CABG, AS sp AVR, HLD, depression, T2DM, history of lung cancer, B THA    PT Comments  Focused beginning of session on reducing pt's L shoulder pain by stretching and providing soft tissue mobilization to the muscles involved. Pt reported reproduction of pain with palpation of her L upper trapezius muscle belly and reported relief with techniques provided. Remainder of session focused on progressing OOB mobility. She was able to ambulate in the halls without UE support, LOB, or physical assistance today, displaying a mildly antalgic gait pattern due to L hip soreness. Will continue to follow acutely.     If plan is discharge home, recommend the following: A little help with bathing/dressing/bathroom;Assist for transportation;Help with stairs or ramp for entrance;Assistance with cooking/housework   Can travel by private vehicle        Equipment Recommendations  None recommended by PT    Recommendations for Other Services       Precautions / Restrictions Precautions Precautions: Fall Precaution Comments: watch sats Restrictions Weight Bearing Restrictions: No     Mobility  Bed Mobility Overal bed mobility: Needs Assistance Bed Mobility: Supine to Sit, Sit to Supine     Supine to sit: Supervision, Used rails, HOB elevated Sit to supine: Supervision, Used rails, HOB elevated   General bed mobility comments: Supervision for safety    Transfers Overall transfer level: Needs assistance Equipment used: None Transfers: Sit to/from Stand Sit to Stand: Supervision           General transfer comment:  Supervision for safety, slow to rise due to pain, no LOB    Ambulation/Gait Ambulation/Gait assistance: Supervision Gait Distance (Feet): 260 Feet Assistive device: None Gait Pattern/deviations: Step-through pattern, Decreased stance time - left, Decreased stride length, Antalgic Gait velocity: reduced Gait velocity interpretation: 1.31 - 2.62 ft/sec, indicative of limited community ambulator   General Gait Details: Pt ambulates slowly with a mild antalgic gait pattern due to L hip pain. No overt LOB, supervision for safety   Stairs             Wheelchair Mobility     Tilt Bed    Modified Rankin (Stroke Patients Only)       Balance Overall balance assessment: Mild deficits observed, not formally tested                                          Cognition Arousal: Alert Behavior During Therapy: WFL for tasks assessed/performed Overall Cognitive Status: Within Functional Limits for tasks assessed                                          Exercises Other Exercises Other Exercises: PROM to L upper trap and levator scap muscles to tolerance, sitting EOB Other Exercises: Soft tissue mobilization to pt tolerance to pt's L shoulder to reduce pain, sitting EOB    General Comments General comments (skin integrity, edema, etc.): SpO2 down to 89%  on RA when ambulating, rebounds quickly into 90s% with standing rest break, >/= 93% on RA at rest end of session with [place] within reach with O2 flowing if needed; encouraged x2-3 walks in hallways/day while here, she verbalized understanding      Pertinent Vitals/Pain Pain Assessment Pain Assessment: Faces Faces Pain Scale: Hurts little more Pain Location: L shoulder and hip Pain Descriptors / Indicators: Discomfort, Grimacing, Guarding (grabbing) Pain Intervention(s): Limited activity within patient's tolerance, Monitored during session, Repositioned, Utilized relaxation techniques    Home  Living                          Prior Function            PT Goals (current goals can now be found in the care plan section) Acute Rehab PT Goals Patient Stated Goal: return home PT Goal Formulation: With patient Time For Goal Achievement: 04/29/23 Potential to Achieve Goals: Good Progress towards PT goals: Progressing toward goals    Frequency    Min 1X/week      PT Plan      Co-evaluation              AM-PAC PT "6 Clicks" Mobility   Outcome Measure  Help needed turning from your back to your side while in a flat bed without using bedrails?: A Little Help needed moving from lying on your back to sitting on the side of a flat bed without using bedrails?: A Little Help needed moving to and from a bed to a chair (including a wheelchair)?: A Little Help needed standing up from a chair using your arms (e.g., wheelchair or bedside chair)?: A Little Help needed to walk in hospital room?: A Little Help needed climbing 3-5 steps with a railing? : A Little 6 Click Score: 18    End of Session Equipment Utilized During Treatment: Oxygen Activity Tolerance: Patient tolerated treatment well Patient left: with call bell/phone within reach;in bed;with bed alarm set Nurse Communication: Mobility status;Other (comment) (sats) PT Visit Diagnosis: Pain;Difficulty in walking, not elsewhere classified (R26.2);Other abnormalities of gait and mobility (R26.89) Pain - Right/Left: Left Pain - part of body: Shoulder (chest)     Time: 2725-3664 PT Time Calculation (min) (ACUTE ONLY): 26 min  Charges:    $Therapeutic Exercise: 8-22 mins $Therapeutic Activity: 8-22 mins PT General Charges $$ ACUTE PT VISIT: 1 Visit                     Raymond Gurney, PT, DPT Acute Rehabilitation Services  Office: [phone]    Jewel Baize 04/16/2023, 5:06 PM

## 2023-04-17 ENCOUNTER — Inpatient Hospital Stay (HOSPITAL_COMMUNITY): Payer: PPO

## 2023-04-17 DIAGNOSIS — S2249XA Multiple fractures of ribs, unspecified side, initial encounter for closed fracture: Secondary | ICD-10-CM | POA: Diagnosis not present

## 2023-04-17 LAB — GLUCOSE, CAPILLARY
Glucose-Capillary: 150 mg/dL — ABNORMAL HIGH (ref 70–99)
Glucose-Capillary: 161 mg/dL — ABNORMAL HIGH (ref 70–99)

## 2023-04-17 MED ORDER — OXYCODONE HCL 5 MG PO TABS: 5.0000 mg | ORAL_TABLET | ORAL | 0 refills | Status: DC | PRN

## 2023-04-17 MED ORDER — METHOCARBAMOL 500 MG PO TABS: 500.0000 mg | ORAL_TABLET | Freq: Three times a day (TID) | ORAL | 0 refills | Status: AC | PRN

## 2023-04-17 NOTE — Progress Notes (Addendum)
   Left chest tube placement in IR 8/7- traumatic PTX Chest tube removal 8/9 CXR this am IMPRESSION: 1. Removal of the left chest tube without definite pneumothorax. 2. Persistent subcutaneous gas in the left chest. 3. Left basilar densities could represent atelectasis.    Discussed with pt these findings IR will sign off

## 2023-04-17 NOTE — Discharge Summary (Signed)
Physician Discharge Summary  Sarah Stevenson:[id number] DOB: June 19, 1956 DOA: 04/14/2023  PCP: Barbie Banner, MD  Admit date: 04/14/2023 Discharge date: 04/17/2023  Admitted From: Home Disposition:  Home  Recommendations for Outpatient Follow-up:  Follow up with PCP in 1 week  Discharge Condition: Stable CODE STATUS: Full Diet recommendation: Carb modified   Brief/Interim Summary: Sarah Stevenson is a 67 year old female with past medical history of CAD sp CABG, AS sp AVR, HLD, depression, T2DM, history of lung cancer.  Patient has a dog that she states wakes her up at times of night.  She believes this is what happened, resulting in her falling while going down steps on her deck.  Patient hit her head, suffered LOC. She woke at approximately 10 AM yesterday morning.  She does not recall the events from the night.  She had left-sided chest pain that persisted and worsened.  She endorses shortness of breath.  Today she called her daughter-in-law who brought her to the ER.  Patient maintained on Effient, her last dose was Monday.   In the ER patient's vitals were stable afebrile, HR 76.  Imaging showed to scalp hematoma, approximately 5 left-sided rib fractures, pneumomediastinum, small loculated pneumothorax, along with other findings.  Pigtail catheter placed for pneumothorax by IR.  Trauma service consulted.  Patient had clinical improvement.  Chest tube was removed 8/9 with stabilization in her chest x-ray.  On day of discharge, patient was ambulatory around the room and on room air.  Discharge Diagnoses:   Principal Problem:   Multiple rib fractures involving four or more ribs Active Problems:   Hyperlipidemia   Left pulmonary contusion   Subcutaneous emphysema due to trauma, initial encounter (HCC)   Fall at home, initial encounter   Pneumomediastinum (HCC)   Pneumothorax   AKI (acute kidney injury) (HCC)   Hyponatremia   T2DM (type 2 diabetes mellitus) (HCC)    Fall with  injury Extensive pneumomediastinum Multiple left-sided rib fractures Left lower pneumothorax -Pigtail catheter placed by IR, IR managing --> removed 8/9 -Trauma surgery consulted  -Incentive spirometry, flutter valve -PT OT without recs at discharge    AKI, ruled out -Baseline creatinine 0.98   CAD -Coreg, Effient   Diabetes mellitus, well-controlled -A1c 5.9 -Sliding scale insulin during hospitalization.  Resume home meds, metformin, glipizide, Trulicity  Hypertension -Cozaar, Coreg   Hyperlipidemia -Repatha PTA    Depression -Zoloft   Aortic stenosis -Status post bioprosthetic AVR/CABG   Hyponatremia -Resolved    Discharge Instructions  Discharge Instructions     Call MD for:  difficulty breathing, headache or visual disturbances   Complete by: As directed    Call MD for:  extreme fatigue   Complete by: As directed    Call MD for:  persistant dizziness or light-headedness   Complete by: As directed    Call MD for:  persistant nausea and vomiting   Complete by: As directed    Call MD for:  redness, tenderness, or signs of infection (pain, swelling, redness, odor or green/yellow discharge around incision site)   Complete by: As directed    Call MD for:  severe uncontrolled pain   Complete by: As directed    Call MD for:  temperature >100.4   Complete by: As directed    Diet Carb Modified   Complete by: As directed    Discharge instructions   Complete by: As directed    You were cared for by a hospitalist during your hospital stay. If you  have any questions about your discharge medications or the care you received while you were in the hospital after you are discharged, you can call the unit and ask to speak with the hospitalist on call if the hospitalist that took care of you is not available. Once you are discharged, your primary care physician will handle any further medical issues. Please note that NO REFILLS for any discharge medications will be authorized  once you are discharged, as it is imperative that you return to your primary care physician (or establish a relationship with a primary care physician if you do not have one) for your aftercare needs so that they can reassess your need for medications and monitor your lab values.   Increase activity slowly   Complete by: As directed       Allergies as of 04/17/2023       Reactions   Brilinta [ticagrelor] Shortness Of Breath   Lisinopril Cough   Asa [aspirin] Hives, Rash   Blue Dyes (parenteral) Hives   Corn-containing Products Hives   Nsaids Hives, Rash   Tea Hives   Rosuvastatin    myalgias   Statins    Zetia [ezetimibe]    myalgias   Isosulfan Blue Rash   Other Itching, Rash   CHG soap        Medication List     TAKE these medications    amoxicillin 500 MG capsule Commonly known as: AMOXIL Take 4 capsules by mouth 1 hour before appointment   carvedilol 6.25 MG tablet Commonly known as: COREG Take 1 tablet (6.25 mg total) by mouth 2 (two) times daily with a meal.   celecoxib 200 MG capsule Commonly known as: CELEBREX Take 1 capsule (200 mg total) by mouth daily as needed.   cetirizine 10 MG tablet Commonly known as: ZYRTEC Take 25 mg by mouth daily.   COLLAGEN PO Take 1 Scoop by mouth daily. With biotin and vitamin c   famotidine 20 MG tablet Commonly known as: PEPCID Take 20 mg by mouth at bedtime.   fluticasone 50 MCG/ACT nasal spray Commonly known as: FLONASE Place 1 spray into both nostrils daily.   glipiZIDE 5 MG 24 hr tablet Commonly known as: GLUCOTROL XL Take 1 tablet (5 mg total) by mouth daily for diabetes. What changed: Another medication with the same name was removed. Continue taking this medication, and follow the directions you see here.   IVIZIA DRY EYES OP Apply 2 drops to eye daily.   losartan 50 MG tablet Commonly known as: COZAAR TAKE 1 TABLET BY MOUTH DAILY TO PRESERVE KIDNEY FUNCTION.   metFORMIN 850 MG tablet Commonly  known as: GLUCOPHAGE Take 1 tablet (850 mg total) by mouth 2 (two) times daily to control diabetes.   methocarbamol 500 MG tablet Commonly known as: ROBAXIN Take 1 tablet (500 mg total) by mouth every 8 (eight) hours as needed for up to 30 doses for muscle spasms. What changed:  how much to take how to take this when to take this reasons to take this   montelukast 10 MG tablet Commonly known as: SINGULAIR Take 1 tablet by mouth daily for allergy control   MUSCLE RUB EX Apply 1 application topically as needed (for pain).   OneTouch Ultra test strip Generic drug: glucose blood TEST BLOOD SUGAR ONCE A DAY   oxyCODONE 5 MG immediate release tablet Commonly known as: Oxy IR/ROXICODONE Take 1-2 tablets (5-10 mg total) by mouth every 4 (four) hours as needed for moderate  pain or severe pain (5mg  moderate, 10mg  severe).   prasugrel 10 MG Tabs tablet Commonly known as: EFFIENT Take 1 tablet (10 mg total) by mouth daily.   Repatha SureClick 140 MG/ML Soaj Generic drug: Evolocumab INJECT 140 MG INTO THE SKIN EVERY 14 DAYS   sertraline 50 MG tablet Commonly known as: ZOLOFT Take 1 tablet (50 mg total) by mouth daily to control anxiety. What changed: how much to take   Trulicity 1.5 MG/0.5ML Sopn Generic drug: Dulaglutide Inject 1.5 mg into the skin once a week.   Tylenol Dissolve Packs 500 MG Pack Generic drug: Acetaminophen Take 1,000 mg by mouth every 6 (six) hours as needed (pain).   acetaminophen 325 MG tablet Commonly known as: TYLENOL Take 650 mg by mouth every 6 (six) hours as needed for moderate pain.        Follow-up Information     Barbie Banner, MD Follow up.   Specialty: Family Medicine Contact information: [address] [place] [address] [phone]                Allergies  Allergen Reactions   Brilinta [Ticagrelor] Shortness Of Breath   Lisinopril Cough   Asa [Aspirin] Hives and Rash   Blue Dyes (Parenteral) Hives    Corn-Containing Products Hives   Nsaids Hives and Rash   Tea Hives   Rosuvastatin     myalgias   Statins    Zetia [Ezetimibe]     myalgias   Isosulfan Blue Rash   Other Itching and Rash    CHG soap    Consultations: Trauma Surgery IR    Procedures/Studies: DG CHEST PORT 1 VIEW  Result Date: 04/17/2023 CLINICAL DATA:  Follow-up after left chest tube removal. EXAM: PORTABLE CHEST 1 VIEW COMPARISON:  04/16/2023 FINDINGS: Small bore left chest tube has been removed. No definite pneumothorax. Persistent subcutaneous gas in left chest. Again noted are postsurgical changes in the left lung. Heart size is stable and within normal limits. Densities at the left lung base may be associated with atelectasis. Right lung is clear. Prior median sternotomy and evidence for previous aortic valve replacement. Left rib fractures. IMPRESSION: 1. Removal of the left chest tube without definite pneumothorax. 2. Persistent subcutaneous gas in the left chest. 3. Left basilar densities could represent atelectasis. Electronically Signed   By: Richarda Overlie M.D.   On: 04/17/2023 07:21   DG Chest 1V REPEAT Same Day  Result Date: 04/16/2023 CLINICAL DATA:  Pneumothorax. EXAM: CHEST - 1 VIEW SAME DAY COMPARISON:  April 16, 2023. FINDINGS: Stable cardiomegaly. Status post aortic valvular repair as well as coronary artery bypass graft. Stable left-sided chest tube is noted. No definite pneumothorax is noted. Multiple left rib fractures are again noted with stable subcutaneous emphysema along left lateral chest wall and supraclavicular regions. Postsurgical changes are seen in left lung. IMPRESSION: Stable left-sided chest tube without definite pneumothorax. Stable left rib fractures with associated subcutaneous emphysema. Electronically Signed   By: Lupita Raider M.D.   On: 04/16/2023 15:07   DG Chest Port 1 View  Result Date: 04/16/2023 CLINICAL DATA:  67 year old female status post fall with displaced left rib  fractures, left pneumothorax, pulmonary contusion. Chest tube placed. EXAM: PORTABLE CHEST 1 VIEW COMPARISON:  Portable chest 04/15/2023 and earlier. FINDINGS: Portable AP semi upright view at 0528 hours. Stable pigtail type left chest tube since yesterday. Left chest wall and supraclavicular soft tissue gas appears stable to slightly regressed. No residual pneumothorax is  identified. Superimposed pre-existing postoperative changes in the left upper lung and hilum from previous partial pneumonectomy. Mildly improved left lung base ventilation. Stable cardiac size and mediastinal contours. Prior CABG. Right lung appears negative. Multiple displaced left rib fractures again noted. Negative visible bowel gas. IMPRESSION: 1. Stable left chest tube and no residual pneumothorax identified. 2. Mildly improved left lung base ventilation. Stable to regressed left chest wall and supraclavicular gas. 3. Displaced left rib fractures. Previous partial left lung resection. No new cardiopulmonary abnormality. Electronically Signed   By: Odessa Fleming M.D.   On: 04/16/2023 06:30   CT GUIDED VISCERAL FLUID DRAIN BY PERC CATH  Result Date: 04/15/2023 INDICATION: 67 year old female referred for left chest tube placement for pneumothorax. EXAM: CT GUIDED LEFT CHEST TUBE PLACEMENT MEDICATIONS: None ANESTHESIA/SEDATION: 1.0 mg IV Versed 50 mcg IV Fentanyl Moderate Sedation Time:  27 minute The patient was continuously monitored during the procedure by the interventional radiology nurse under my direct supervision. COMPLICATIONS: Six none TECHNIQUE: Informed written consent was obtained from the patient after a thorough discussion of the procedural risks, benefits and alternatives. All questions were addressed. Maximal Sterile Barrier Technique was utilized including caps, mask, sterile gowns, sterile gloves, sterile drape, hand hygiene and skin antiseptic. A timeout was performed prior to the initiation of the procedure. PROCEDURE: Patient  was positioned supine on the CT gantry table. Left arm was extended above the head. Scout images were acquired. The left chest wall was prepped with chlorhexidine in a sterile fashion, and a sterile drape was applied covering the operative field. A sterile gown and sterile gloves were used for the procedure. Local anesthesia was provided with 1% Lidocaine. Once the patient was anesthetized, a trocar needle was advanced with CT guidance into the pneumothorax at the base of the left chest. Once we confirmed needle tip position modified Seldinger technique was used to place a 10 [place] drain. Drain was attached to the pleura vac unit to confirm function and a final CT was acquired. Patient tolerated the procedure well and remained hemodynamically stable throughout. No complications were encountered and no significant blood loss. FINDINGS: Ten French drain into the left chest for treatment pneumothorax. A chest tube function was confirmed with improved pneumothorax component after placement. IMPRESSION: Status post CT-guided left chest tube placement. Signed, Yvone Neu. Miachel Roux, RPVI [facility] Electronically Signed   By: Gilmer Mor D.O.   On: 04/15/2023 12:26   DG Chest Port 1 View  Result Date: 04/15/2023 CLINICAL DATA:  Pneumothorax.  Chest tube placed yesterday. EXAM: PORTABLE CHEST 1 VIEW COMPARISON:  One-view chest x-ray 04/14/2023 FINDINGS: The heart is enlarged. Median sternotomy noted. Aortic valve replacement is present. Atherosclerotic changes are present at the aortic arch. A left-sided chest tube is in place. A tiny apical pneumothorax is present. Subcutaneous emphysema is present over the left chest wall and neck. Increasing left pleural effusion and basilar airspace opacity is present. The right lung is clear. Lung volumes are low. IMPRESSION: 1. Tiny left apical pneumothorax with left-sided chest tube in place. 2. Increasing left  pleural effusion and basilar airspace opacity likely reflects atelectasis. Infection is not excluded. 3. Cardiomegaly without failure. Electronically Signed   By: Marin Roberts M.D.   On: 04/15/2023 08:26   CT CHEST ABDOMEN PELVIS W CONTRAST  Result Date: 04/14/2023 CLINICAL DATA:  Polytrauma, blunt. Fall. Prior left upper lobe malignancy. EXAM: CT CHEST, ABDOMEN, AND PELVIS WITH CONTRAST TECHNIQUE: Multidetector CT imaging of the  chest, abdomen and pelvis was performed following the standard protocol during bolus administration of intravenous contrast. RADIATION DOSE REDUCTION: This exam was performed according to the departmental dose-optimization program which includes automated exposure control, adjustment of the mA and/or kV according to patient size and/or use of iterative reconstruction technique. CONTRAST:  OMNIPAQUE IOHEXOL 300 MG/ML  SOLN COMPARISON:  Chest x-ray 04/14/2023, pet CT 06/29/2018 FINDINGS: CHEST: Cardiovascular: No aortic injury. The thoracic aorta is normal in caliber. The heart is normal in size. No significant pericardial effusion. Severe atherosclerotic plaque. Aortic valve replacement. Four-vessel coronary calcification. Mediastinum/Nodes: Moderate volume pneumomediastinum.  No mediastinal hematoma. The esophagus is unremarkable. The thyroid is unremarkable. The central airways are patent. No mediastinal, hilar, or axillary lymphadenopathy. Lungs/Pleura: Left lower lobe pulmonary contusion. Left upper lobe surgical changes and scarring from wedge resection. No pulmonary nodule. No pulmonary mass. No definite pulmonary laceration. No pneumatocele formation. No pleural effusion. At least small volume loculated left lower pneumothorax. No hemothorax. Musculoskeletal/Chest wall: Extensive subcutaneus soft tissue emphysema along the left chest wall, left back, bilateral neck. Acute full-shaft wdith displaced anterolateral left 4-8 rib fractures. Acute nondisplaced posterior  left 5-6 rib fractures. No sternal fracture. No scapular fracture. No clavicular fracture. Intact sternotomy wires. No spinal fracture. ABDOMEN / PELVIS: Hepatobiliary: Not enlarged. No focal lesion. No laceration or subcapsular hematoma. The gallbladder is otherwise unremarkable with no radio-opaque gallstones. No biliary ductal dilatation. Pancreas: Normal pancreatic contour. No main pancreatic duct dilatation. Spleen: Not enlarged. No focal lesion. No laceration, subcapsular hematoma, or vascular injury. Adrenals/Urinary Tract: No nodularity bilaterally. Bilateral kidneys enhance symmetrically. No hydronephrosis. No contusion, laceration, or subcapsular hematoma. No injury to the vascular structures or collecting systems. No hydroureter. The urinary bladder is grossly unremarkable with limited evaluation due to streak artifact originating from bilateral femoral surgical hardware. On delayed imaging, there is no urothelial wall thickening and there are no filling defects in the opacified portions of the bilateral collecting systems or ureters. Stomach/Bowel: No small or large bowel wall thickening or dilatation. The appendix is unremarkable. Vasculature/Lymphatics: Severe atherosclerotic plaque. No abdominal aorta or iliac aneurysm. No active contrast extravasation or pseudoaneurysm. No abdominal, pelvic, inguinal lymphadenopathy. Reproductive: Coarsened uterine lesions suggestive of degenerative uterine fibroids. Uterus and bilateral adnexal regions are otherwise unremarkable. Slightly limited evaluation due to streak artifact originating from bilateral hip surgical hardware. Other: No simple free fluid ascites. No pneumoperitoneum. No hemoperitoneum. No mesenteric hematoma identified. No organized fluid collection. Musculoskeletal: Left hip film flank subcutaneus soft tissue hematoma formation. Left chest subcutaneus soft tissue emphysema extends inferiorly along the left flank soft tissues and anterior  abdominal soft tissue/groin. Multilevel degenerative changes spine with intervertebral disc space vacuum phenomenon and endplate sclerosis at the L2-L3 level. Grade 1 anterolisthesis of L4 on L5. No acute pelvic fracture. No spinal fracture. Partially visualized total bilateral hip arthroplasty. No CT findings to suggest surgical hardware complication. Ports and Devices: None. IMPRESSION: 1. At least small volume loculated left lower pneumothorax. 2. Moderate volume pneumomediastinum. Finding likely due to trauma in a patient status post left upper lobe wedge resection and surgical changes. No definite other findings of esophageal perforation. 3. Left lower lobe pulmonary contusion. 4. Extensive subcutaneus soft tissue emphysema of the left anterior chest, abdomen, back, neck (bilateral neck). 5. Acute full-shaft width displaced left anterolateral 4-8 rib fractures. Acute nondisplaced posterior left 5-6 rib fractures. No flail chest. 6. Left hip film flank subcutaneus soft tissue hematoma formation. 7. No acute intra-abdominal or intrapelvic traumatic injury. 8.  No acute fracture or traumatic malalignment of the thoracic or lumbar spine. 9. Other imaging findings of potential clinical significance: Aortic Atherosclerosis (ICD10-I70.0) including least 4 vessel coronary artery. Status post aortic valve replacement. Total bilateral hip arthroplasty partially visualized. These results were called by telephone at the time of interpretation on 04/14/2023 at 1:25 pm to provider [facility] , who verbally acknowledged these results. Electronically Signed   By: Tish Frederickson M.D.   On: 04/14/2023 13:41   CT Head Wo Contrast  Result Date: 04/14/2023 CLINICAL DATA:  Head trauma, minor (Age >= 65y); Neck trauma (Age >= 65y). EXAM: CT HEAD WITHOUT CONTRAST CT CERVICAL SPINE WITHOUT CONTRAST TECHNIQUE: Multidetector CT imaging of the head and cervical spine was performed following the standard protocol without intravenous  contrast. Multiplanar CT image reconstructions of the cervical spine were also generated. RADIATION DOSE REDUCTION: This exam was performed according to the departmental dose-optimization program which includes automated exposure control, adjustment of the mA and/or kV according to patient size and/or use of iterative reconstruction technique. COMPARISON:  None available. FINDINGS: CT HEAD FINDINGS Brain: No acute intracranial hemorrhage. Gray-white differentiation is preserved. No hydrocephalus or extra-axial collection. No mass effect or midline shift. Vascular: No hyperdense vessel or unexpected calcification. Skull: No calvarial fracture or suspicious bone lesion. Skull base is unremarkable. Sinuses/Orbits: No acute finding. Other: Left frontotemporal scalp hematoma. Subcutaneous emphysema dissecting along the deep fascial planes of the neck to the skull base. CT CERVICAL SPINE FINDINGS Alignment: Normal. Skull base and vertebrae: No acute fracture. Normal craniocervical junction. No suspicious bone lesions. Soft tissues and spinal canal: No prevertebral fluid or swelling. No visible canal hematoma. Disc levels: Multilevel cervical spondylosis, worst at C5-6, where there is at least mild spinal canal stenosis. Upper chest: Trace left apical pneumothorax, better evaluated on same day chest CT. Extensive pneumomediastinum and subcutaneous emphysema tracking along the deep fascial planes of the neck, left-greater-than-right. Other: Atherosclerotic calcifications of the carotid bulbs. IMPRESSION: 1. No acute intracranial abnormality. 2. Left frontotemporal scalp hematoma without underlying calvarial fracture. 3. No acute cervical spine fracture or traumatic listhesis. 4. Trace left apical pneumothorax, better evaluated on same day chest CT. Extensive pneumomediastinum and subcutaneous emphysema tracking along the deep fascial planes of the neck, left-greater-than-right. Electronically Signed   By: Orvan Falconer  M.D.   On: 04/14/2023 13:16   CT Cervical Spine Wo Contrast  Result Date: 04/14/2023 CLINICAL DATA:  Head trauma, minor (Age >= 65y); Neck trauma (Age >= 65y). EXAM: CT HEAD WITHOUT CONTRAST CT CERVICAL SPINE WITHOUT CONTRAST TECHNIQUE: Multidetector CT imaging of the head and cervical spine was performed following the standard protocol without intravenous contrast. Multiplanar CT image reconstructions of the cervical spine were also generated. RADIATION DOSE REDUCTION: This exam was performed according to the departmental dose-optimization program which includes automated exposure control, adjustment of the mA and/or kV according to patient size and/or use of iterative reconstruction technique. COMPARISON:  None available. FINDINGS: CT HEAD FINDINGS Brain: No acute intracranial hemorrhage. Gray-white differentiation is preserved. No hydrocephalus or extra-axial collection. No mass effect or midline shift. Vascular: No hyperdense vessel or unexpected calcification. Skull: No calvarial fracture or suspicious bone lesion. Skull base is unremarkable. Sinuses/Orbits: No acute finding. Other: Left frontotemporal scalp hematoma. Subcutaneous emphysema dissecting along the deep fascial planes of the neck to the skull base. CT CERVICAL SPINE FINDINGS Alignment: Normal. Skull base and vertebrae: No acute fracture. Normal craniocervical junction. No suspicious bone lesions. Soft tissues and spinal canal: No prevertebral fluid  or swelling. No visible canal hematoma. Disc levels: Multilevel cervical spondylosis, worst at C5-6, where there is at least mild spinal canal stenosis. Upper chest: Trace left apical pneumothorax, better evaluated on same day chest CT. Extensive pneumomediastinum and subcutaneous emphysema tracking along the deep fascial planes of the neck, left-greater-than-right. Other: Atherosclerotic calcifications of the carotid bulbs. IMPRESSION: 1. No acute intracranial abnormality. 2. Left frontotemporal  scalp hematoma without underlying calvarial fracture. 3. No acute cervical spine fracture or traumatic listhesis. 4. Trace left apical pneumothorax, better evaluated on same day chest CT. Extensive pneumomediastinum and subcutaneous emphysema tracking along the deep fascial planes of the neck, left-greater-than-right. Electronically Signed   By: Orvan Falconer M.D.   On: 04/14/2023 13:16   DG Chest Port 1 View  Result Date: 04/14/2023 CLINICAL DATA:  Status post fall EXAM: PORTABLE CHEST 1 VIEW COMPARISON:  Chest radiograph dated 09/29/2018 FINDINGS: Extensive left chest wall and left neck subcutaneous emphysema. Left basilar patchy and dense left retrocardiac opacity. Trace left pneumothorax. Similar postsurgical cardiomediastinal silhouette. Multiple displaced left rib fractures. Median sternotomy wires are nondisplaced. IMPRESSION: 1. Multiple displaced left rib fractures with trace left pneumothorax and extensive left chest wall and left neck subcutaneous emphysema. 2. Left basilar patchy and dense left retrocardiac opacity, may reflect a combination of atelectasis and pulmonary contusion. These results were called by telephone at the time of interpretation on 04/14/2023 at 11:52 am to provider [facility] , who verbally acknowledged these results. Electronically Signed   By: Agustin Cree M.D.   On: 04/14/2023 11:52       Discharge Exam: Vitals:   04/17/23 0429 04/17/23 0710  BP: (!) 148/76 130/68  Pulse: 66 73  Resp: 16 16  Temp: 98.6 F (37 C) 98.8 F (37.1 C)  SpO2: 93% 94%    General: Pt is alert, awake, not in acute distress Cardiovascular: RRR, S1/S2 +, no edema Respiratory: CTA bilaterally, no wheezing, no rhonchi, no respiratory distress, no conversational dyspnea, on room air  Abdominal: Soft, NT, ND, bowel sounds + Extremities: no edema, no cyanosis Psych: Normal mood and affect, stable judgement and insight     The results of significant diagnostics from this hospitalization  (including imaging, microbiology, ancillary and laboratory) are listed below for reference.     Microbiology: No results found for this or any previous visit (from the past 240 hour(s)).   Labs: BNP (last 3 results) No results for input(s): "BNP" in the last 8760 hours. Basic Metabolic Panel: Recent Labs  Lab 04/14/23 1020 04/15/23 0227 04/16/23 0518  NA 131* 136 134*  K 4.3 4.1 3.9  CL 96* 103 104  CO2 23 24 20*  GLUCOSE 193* 165* 187*  BUN 31* 29* 21  CREATININE 1.25* 1.20* 1.01*  CALCIUM 8.7* 8.3* 8.1*   Liver Function Tests: Recent Labs  Lab 04/14/23 1020  AST 27  ALT 21  ALKPHOS 60  BILITOT 1.3*  PROT 7.1  ALBUMIN 3.9   No results for input(s): "LIPASE", "AMYLASE" in the last 168 hours. No results for input(s): "AMMONIA" in the last 168 hours. CBC: Recent Labs  Lab 04/14/23 1020 04/15/23 0227  WBC 12.9* 9.1  NEUTROABS 9.8*  --   HGB 12.8 12.4  HCT 37.7 38.2  MCV 91.3 94.6  PLT 205 177   Cardiac Enzymes: No results for input(s): "CKTOTAL", "CKMB", "CKMBINDEX", "TROPONINI" in the last 168 hours. BNP: Invalid input(s): "POCBNP" CBG: Recent Labs  Lab 04/16/23 0742 04/16/23 1150 04/16/23 1525 04/16/23 2116 04/17/23 2993  GLUCAP 206* 168* 135* 141* 150*   D-Dimer No results for input(s): "DDIMER" in the last 72 hours. Hgb A1c Recent Labs    04/15/23 0227  HGBA1C 5.9*   Lipid Profile No results for input(s): "CHOL", "HDL", "LDLCALC", "TRIG", "CHOLHDL", "LDLDIRECT" in the last 72 hours. Thyroid function studies No results for input(s): "TSH", "T4TOTAL", "T3FREE", "THYROIDAB" in the last 72 hours.  Invalid input(s): "FREET3" Anemia work up No results for input(s): "VITAMINB12", "FOLATE", "FERRITIN", "TIBC", "IRON", "RETICCTPCT" in the last 72 hours. Urinalysis    Component Value Date/Time   COLORURINE YELLOW 04/14/2023 1231   APPEARANCEUR CLEAR 04/14/2023 1231   LABSPEC 1.010 04/14/2023 1231   PHURINE 5.5 04/14/2023 1231   GLUCOSEU  NEGATIVE 04/14/2023 1231   HGBUR NEGATIVE 04/14/2023 1231   BILIRUBINUR NEGATIVE 04/14/2023 1231   KETONESUR NEGATIVE 04/14/2023 1231   PROTEINUR NEGATIVE 04/14/2023 1231   NITRITE NEGATIVE 04/14/2023 1231   LEUKOCYTESUR NEGATIVE 04/14/2023 1231   Sepsis Labs Recent Labs  Lab 04/14/23 1020 04/15/23 0227  WBC 12.9* 9.1   Microbiology No results found for this or any previous visit (from the past 240 hour(s)).   Patient was seen and examined on the day of discharge and was found to be in stable condition. Time coordinating discharge: 25 minutes including assessment and coordination of care, as well as examination of the patient.   SIGNED:  Noralee Stain, DO [facility] 04/17/2023, 11:02 AM

## 2023-04-17 NOTE — Plan of Care (Signed)
  Problem: Coping: Goal: Ability to adjust to condition or change in health will improve Outcome: Progressing   Problem: Education: Goal: Knowledge of General Education information will improve Description: Including pain rating scale, medication(s)/side effects and non-pharmacologic comfort measures Outcome: Progressing   

## 2023-04-26 ENCOUNTER — Other Ambulatory Visit: Payer: Self-pay

## 2023-04-27 ENCOUNTER — Other Ambulatory Visit (HOSPITAL_BASED_OUTPATIENT_CLINIC_OR_DEPARTMENT_OTHER): Payer: Self-pay

## 2023-04-27 ENCOUNTER — Other Ambulatory Visit: Payer: Self-pay

## 2023-04-27 ENCOUNTER — Other Ambulatory Visit: Payer: Self-pay | Admitting: Interventional Cardiology

## 2023-04-27 MED ORDER — REPATHA SURECLICK 140 MG/ML ~~LOC~~ SOAJ
140.0000 mg | SUBCUTANEOUS | 3 refills | Status: DC
  Filled 2023-04-27: qty 6, 84d supply, fill #0
  Filled 2023-08-01: qty 6, 84d supply, fill #1
  Filled 2023-10-26: qty 6, 84d supply, fill #2
  Filled 2024-01-21: qty 6, 84d supply, fill #3

## 2023-04-27 MED ORDER — OXYCODONE HCL 5 MG PO TABS
10.0000 mg | ORAL_TABLET | ORAL | 0 refills | Status: DC | PRN
  Filled 2023-04-27: qty 30, 3d supply, fill #0

## 2023-04-28 ENCOUNTER — Other Ambulatory Visit (HOSPITAL_BASED_OUTPATIENT_CLINIC_OR_DEPARTMENT_OTHER): Payer: Self-pay

## 2023-04-29 ENCOUNTER — Ambulatory Visit (HOSPITAL_BASED_OUTPATIENT_CLINIC_OR_DEPARTMENT_OTHER)
Admission: RE | Admit: 2023-04-29 | Discharge: 2023-04-29 | Disposition: A | Discharge status: Home / Self Care | Payer: PPO | Source: Ambulatory Visit | Attending: Nurse Practitioner | Admitting: Nurse Practitioner

## 2023-04-29 ENCOUNTER — Other Ambulatory Visit (HOSPITAL_BASED_OUTPATIENT_CLINIC_OR_DEPARTMENT_OTHER): Payer: Self-pay | Admitting: Nurse Practitioner

## 2023-04-29 DIAGNOSIS — S2242XD Multiple fractures of ribs, left side, subsequent encounter for fracture with routine healing: Secondary | ICD-10-CM | POA: Diagnosis present

## 2023-06-15 ENCOUNTER — Other Ambulatory Visit (HOSPITAL_BASED_OUTPATIENT_CLINIC_OR_DEPARTMENT_OTHER): Payer: Self-pay

## 2023-06-15 MED ORDER — CELECOXIB 200 MG PO CAPS
200.0000 mg | ORAL_CAPSULE | Freq: Every day | ORAL | 0 refills | Status: DC | PRN
  Filled 2023-06-15: qty 30, 30d supply, fill #0

## 2023-06-17 ENCOUNTER — Other Ambulatory Visit (HOSPITAL_BASED_OUTPATIENT_CLINIC_OR_DEPARTMENT_OTHER): Payer: Self-pay

## 2023-06-17 MED ORDER — AMOXICILLIN 500 MG PO CAPS
2000.0000 mg | ORAL_CAPSULE | Freq: Every day | ORAL | 0 refills | Status: AC
  Filled 2023-06-17: qty 16, 4d supply, fill #0

## 2023-07-09 ENCOUNTER — Other Ambulatory Visit: Payer: Self-pay | Admitting: Physician Assistant

## 2023-07-09 ENCOUNTER — Other Ambulatory Visit: Payer: Self-pay

## 2023-07-09 ENCOUNTER — Other Ambulatory Visit (HOSPITAL_BASED_OUTPATIENT_CLINIC_OR_DEPARTMENT_OTHER): Payer: Self-pay

## 2023-07-09 MED ORDER — CARVEDILOL 6.25 MG PO TABS
6.2500 mg | ORAL_TABLET | Freq: Two times a day (BID) | ORAL | 0 refills | Status: DC
  Filled 2023-07-09: qty 60, 30d supply, fill #0

## 2023-07-09 MED ORDER — MONTELUKAST SODIUM 10 MG PO TABS
10.0000 mg | ORAL_TABLET | Freq: Every day | ORAL | 5 refills | Status: DC
  Filled 2023-07-09: qty 30, 30d supply, fill #0
  Filled 2023-08-03: qty 30, 30d supply, fill #1
  Filled 2023-09-09: qty 30, 30d supply, fill #2
  Filled 2023-10-26: qty 30, 30d supply, fill #3
  Filled 2023-11-25: qty 30, 30d supply, fill #4
  Filled 2023-12-24: qty 30, 30d supply, fill #5
  Filled ????-??-??: fill #3

## 2023-08-01 ENCOUNTER — Other Ambulatory Visit (HOSPITAL_BASED_OUTPATIENT_CLINIC_OR_DEPARTMENT_OTHER): Payer: Self-pay

## 2023-08-01 ENCOUNTER — Other Ambulatory Visit: Payer: Self-pay

## 2023-08-01 ENCOUNTER — Other Ambulatory Visit: Payer: Self-pay | Admitting: Physician Assistant

## 2023-08-01 ENCOUNTER — Other Ambulatory Visit: Payer: Self-pay | Admitting: Cardiovascular Disease

## 2023-08-02 ENCOUNTER — Other Ambulatory Visit (HOSPITAL_BASED_OUTPATIENT_CLINIC_OR_DEPARTMENT_OTHER): Payer: Self-pay

## 2023-08-02 ENCOUNTER — Other Ambulatory Visit: Payer: Self-pay | Admitting: Physician Assistant

## 2023-08-02 MED ORDER — PRASUGREL HCL 10 MG PO TABS
10.0000 mg | ORAL_TABLET | Freq: Every day | ORAL | 0 refills | Status: DC
  Filled 2023-08-02: qty 30, 30d supply, fill #0

## 2023-08-02 MED ORDER — LOSARTAN POTASSIUM 50 MG PO TABS
50.0000 mg | ORAL_TABLET | Freq: Every day | ORAL | 1 refills | Status: DC
  Filled 2023-08-02: qty 30, 30d supply, fill #0
  Filled 2023-08-26: qty 30, 30d supply, fill #1

## 2023-08-02 MED ORDER — CARVEDILOL 6.25 MG PO TABS
6.2500 mg | ORAL_TABLET | Freq: Two times a day (BID) | ORAL | 0 refills | Status: DC
  Filled 2023-08-02: qty 60, 30d supply, fill #0

## 2023-08-02 MED ORDER — CELECOXIB 200 MG PO CAPS
200.0000 mg | ORAL_CAPSULE | Freq: Every day | ORAL | 0 refills | Status: DC | PRN
  Filled 2023-08-02: qty 30, 30d supply, fill #0

## 2023-08-03 ENCOUNTER — Other Ambulatory Visit: Payer: Self-pay

## 2023-08-03 ENCOUNTER — Other Ambulatory Visit (HOSPITAL_BASED_OUTPATIENT_CLINIC_OR_DEPARTMENT_OTHER): Payer: Self-pay

## 2023-08-09 ENCOUNTER — Inpatient Hospital Stay: Payer: PPO | Attending: Internal Medicine

## 2023-08-09 ENCOUNTER — Encounter (HOSPITAL_COMMUNITY): Payer: Self-pay

## 2023-08-09 ENCOUNTER — Ambulatory Visit (HOSPITAL_COMMUNITY)
Admission: RE | Admit: 2023-08-09 | Discharge: 2023-08-09 | Disposition: A | Discharge status: Home / Self Care | Payer: PPO | Source: Ambulatory Visit | Attending: Internal Medicine | Admitting: Internal Medicine

## 2023-08-09 DIAGNOSIS — E11649 Type 2 diabetes mellitus with hypoglycemia without coma: Secondary | ICD-10-CM | POA: Insufficient documentation

## 2023-08-09 DIAGNOSIS — C3490 Malignant neoplasm of unspecified part of unspecified bronchus or lung: Secondary | ICD-10-CM | POA: Insufficient documentation

## 2023-08-09 DIAGNOSIS — Z85118 Personal history of other malignant neoplasm of bronchus and lung: Secondary | ICD-10-CM | POA: Insufficient documentation

## 2023-08-09 LAB — CBC WITH DIFFERENTIAL (CANCER CENTER ONLY)
Abs Immature Granulocytes: 0.02 10*3/uL (ref 0.00–0.07)
Basophils Absolute: 0.1 10*3/uL (ref 0.0–0.1)
Basophils Relative: 1 %
Eosinophils Absolute: 0.2 10*3/uL (ref 0.0–0.5)
Eosinophils Relative: 2 %
HCT: 46.9 % — ABNORMAL HIGH (ref 36.0–46.0)
Hemoglobin: 15.8 g/dL — ABNORMAL HIGH (ref 12.0–15.0)
Immature Granulocytes: 0 %
Lymphocytes Relative: 32 %
Lymphs Abs: 2.7 10*3/uL (ref 0.7–4.0)
MCH: 31.6 pg (ref 26.0–34.0)
MCHC: 33.7 g/dL (ref 30.0–36.0)
MCV: 93.8 fL (ref 80.0–100.0)
Monocytes Absolute: 0.6 10*3/uL (ref 0.1–1.0)
Monocytes Relative: 7 %
Neutro Abs: 5 10*3/uL (ref 1.7–7.7)
Neutrophils Relative %: 58 %
Platelet Count: 223 10*3/uL (ref 150–400)
RBC: 5 MIL/uL (ref 3.87–5.11)
RDW: 12.9 % (ref 11.5–15.5)
WBC Count: 8.5 10*3/uL (ref 4.0–10.5)
nRBC: 0 % (ref 0.0–0.2)

## 2023-08-09 LAB — CMP (CANCER CENTER ONLY)
ALT: 12 U/L (ref 0–44)
AST: 13 U/L — ABNORMAL LOW (ref 15–41)
Albumin: 4.3 g/dL (ref 3.5–5.0)
Alkaline Phosphatase: 85 U/L (ref 38–126)
Anion gap: 7 (ref 5–15)
BUN: 13 mg/dL (ref 8–23)
CO2: 30 mmol/L (ref 22–32)
Calcium: 9.6 mg/dL (ref 8.9–10.3)
Chloride: 102 mmol/L (ref 98–111)
Creatinine: 0.89 mg/dL (ref 0.44–1.00)
GFR, Estimated: >60 mL/min (ref >60)
Glucose, Bld: 96 mg/dL (ref 70–99)
Potassium: 4.4 mmol/L (ref 3.5–5.1)
Sodium: 139 mmol/L (ref 135–145)
Total Bilirubin: 0.7 mg/dL (ref <1.2)
Total Protein: 7.3 g/dL (ref 6.5–8.1)

## 2023-08-09 MED ORDER — IOHEXOL 300 MG/ML  SOLN
75.0000 mL | Freq: Once | INTRAMUSCULAR | Status: AC | PRN
  Administered 2023-08-09: 75 mL via INTRAVENOUS

## 2023-08-11 ENCOUNTER — Inpatient Hospital Stay: Payer: PPO | Admitting: Internal Medicine

## 2023-08-11 VITALS — BP 131/71 | HR 82 | Temp 97.8°F | Resp 17 | Ht 62.0 in | Wt 175.0 lb

## 2023-08-11 DIAGNOSIS — C3492 Malignant neoplasm of unspecified part of left bronchus or lung: Secondary | ICD-10-CM | POA: Diagnosis not present

## 2023-08-11 DIAGNOSIS — Z85118 Personal history of other malignant neoplasm of bronchus and lung: Secondary | ICD-10-CM | POA: Diagnosis present

## 2023-08-11 DIAGNOSIS — E11649 Type 2 diabetes mellitus with hypoglycemia without coma: Secondary | ICD-10-CM | POA: Diagnosis not present

## 2023-08-11 NOTE — Progress Notes (Signed)
[facility] [phone]   [phone]  OFFICE PROGRESS NOTE  Barbie Banner, MD [address] [place] [address]  DIAGNOSIS: Stage IA (T1 a, Nx, Mx) non-small cell lung cancer, adenocarcinoma measuring 1.2 cm.  PRIOR THERAPY: Status post wedge resection of the left upper lobe on May 24, 2018 under the care of Dr. Tyrone Sage.  CURRENT THERAPY: Observation.  INTERVAL HISTORY: Sarah Stevenson 67 y.o. female returns to the clinic today for annual follow-up visit.Discussed the use of AI scribe software for clinical note transcription with the patient, who gave verbal consent to proceed.  History of Present Illness   The patient, a 67 year old with a history of stage 1A non-small cell lung cancer, underwent a left upper lobe resection in September 2019. Since then, she has been on annual monitoring with no significant health issues reported until a recent fall in August.  The fall was precipitated by a hypoglycemic episode due to a recent weight loss and subsequent adjustment in her diabetes medication. The patient reported waking up from sleep due to her dog and subsequently falling off her deck, resulting in unconsciousness for several hours.  Upon regaining consciousness, she experienced severe pain and was unable to call for help due to her smartwatch being knocked off in the fall. After managing to get up after approximately two hours, she went to bed and sought medical attention the following morning due to worsening condition.  At the hospital, she was diagnosed with a pneumothorax and five fractured ribs on the left side. She also had subcutaneous emphysema from the neck down to the knees and hematomas in the breast. A chest tube was inserted to manage the pneumothorax.  The patient also mentioned a previous scan report that showed some ground glass opacities in the right lung, but she was reassured that as long as it's not growing, it's  not a cause for concern.       MEDICAL HISTORY: Past Medical History:  Diagnosis Date   Anginal pain (HCC)    Aortic stenosis    Coronary artery disease    Depression    "mild" (08/06/2016)   Dyspnea    Hyperlipidemia    lung ca dx'd 05/2018   Murmur, cardiac    Myocardial infarction [facility]) 2016   Osteoarthritis    "left hip and knee" (08/06/2016)   Type II diabetes mellitus (HCC)     ALLERGIES:  is allergic to brilinta [ticagrelor], lisinopril, asa [aspirin], blue dyes (parenteral), corn-containing products, nsaids, tea, rosuvastatin, statins, zetia [ezetimibe], isosulfan blue, and other.  MEDICATIONS:  Current Outpatient Medications  Medication Sig Dispense Refill   Acetaminophen (TYLENOL DISSOLVE PACKS) 500 MG PACK Take 1,000 mg by mouth every 6 (six) hours as needed (pain).     acetaminophen (TYLENOL) 325 MG tablet Take 650 mg by mouth every 6 (six) hours as needed for moderate pain.     amoxicillin (AMOXIL) 500 MG capsule Take 4 capsules (2,000 mg total) by mouth 1 hour before appointment. 16 capsule 0   carvedilol (COREG) 6.25 MG tablet Take 1 tablet (6.25 mg total) by mouth 2 (two) times daily with a meal. 60 tablet 0   celecoxib (CELEBREX) 200 MG capsule Take 1 capsule (200 mg total) by mouth daily as needed. 30 capsule 0   cetirizine (ZYRTEC) 10 MG tablet Take 25 mg by mouth daily.     COLLAGEN PO Take 1 Scoop by mouth daily. With biotin and vitamin  c     Dulaglutide (TRULICITY) 1.5 MG/0.5ML SOAJ Inject 1.5 mg into the skin once a week. 6 mL 3   Evolocumab (REPATHA SURECLICK) 140 MG/ML SOAJ Inject 140 mg into the skin every 14 (fourteen) days. 6 mL 3   famotidine (PEPCID) 20 MG tablet Take 20 mg by mouth at bedtime.     fluticasone (FLONASE) 50 MCG/ACT nasal spray Place 1 spray into both nostrils daily.     glipiZIDE (GLUCOTROL XL) 5 MG 24 hr tablet Take 1 tablet (5 mg total) by mouth daily for diabetes. 90 tablet 3   glucose blood (ONETOUCH ULTRA) test strip TEST BLOOD  SUGAR ONCE A DAY     losartan (COZAAR) 50 MG tablet TAKE 1 TABLET BY MOUTH DAILY TO PRESERVE KIDNEY FUNCTION. 30 tablet 1   Menthol-Methyl Salicylate (MUSCLE RUB EX) Apply 1 application topically as needed (for pain).     metFORMIN (GLUCOPHAGE) 850 MG tablet Take 1 tablet (850 mg total) by mouth 2 (two) times daily to control diabetes. 180 tablet 3   methocarbamol (ROBAXIN) 500 MG tablet Take 1 tablet (500 mg total) by mouth every 8 (eight) hours as needed for up to 30 doses for muscle spasms. 30 tablet 0   montelukast (SINGULAIR) 10 MG tablet Take 1 tablet (10 mg total) by mouth daily. 30 tablet 5   oxyCODONE (OXY IR/ROXICODONE) 5 MG immediate release tablet Take 1-2 tablets (5-10 mg total) by mouth every 4 (four) hours as needed for moderate pain or severe pain (5mg  moderate, 10mg  severe). 30 tablet 0   oxyCODONE (OXY IR/ROXICODONE) 5 MG immediate release tablet Take 2 tablets (10 mg total) by mouth every 4 (four) hours as needed for moderate pain (4-6) or severe pain (7-10) for up to 6 days. (1 tablet for moderate pain, 2 tablets for severe pain). 30 tablet 0   Povidone (IVIZIA DRY EYES OP) Apply 2 drops to eye daily.     prasugrel (EFFIENT) 10 MG TABS tablet Take 1 tablet (10 mg total) by mouth daily. 30 tablet 0   sertraline (ZOLOFT) 50 MG tablet Take 1 tablet (50 mg total) by mouth daily to control anxiety. (Patient taking differently: Take 25 mg by mouth daily.) 90 tablet 3   No current facility-administered medications for this visit.    SURGICAL HISTORY:  Past Surgical History:  Procedure Laterality Date   AORTIC VALVE REPLACEMENT N/A 05/17/2018   Procedure: AORTIC VALVE REPLACEMENT (AVR) USING 21 MM MAGNA EASE PERICARDIAL BIOPROSTHESIS. MODEL # [id number]. SERIAL # [id number];  Surgeon: Delight Ovens, MD;  Location[facility] OR;  Service: Open Heart Surgery;  Laterality: N/A;   CARDIAC CATHETERIZATION Right 11/30/2014   Procedure: RIGHT/LEFT HEART CATH AND CORONARY ANGIOGRAPHY;  Surgeon:  Lyn Records, MD;  Location: [facility] CATH LAB;  Service: Cardiovascular;  Laterality: Right;   CARDIAC CATHETERIZATION N/A 12/13/2015   Procedure: Left Heart Cath and Coronary Angiography;  Surgeon: Marykay Lex, MD;  Location[facility] INVASIVE CV LAB;  Service: Cardiovascular;  Laterality: N/A;   CARDIAC CATHETERIZATION N/A 12/13/2015   Procedure: Intravascular Pressure Wire/FFR Study;  Surgeon: Marykay Lex, MD;  Location: [facility] INVASIVE CV LAB;  Service: Cardiovascular;  Laterality: N/A;  RCA   CARDIAC CATHETERIZATION N/A 08/06/2016   Procedure: Right/Left Heart Cath and Coronary Angiography;  Surgeon: Runell Gess, MD;  Location: [facility] INVASIVE CV LAB;  Service: Cardiovascular;  Laterality: N/A;   CARDIAC CATHETERIZATION N/A 08/06/2016   Procedure: Coronary Stent Intervention;  Surgeon: Runell Gess, MD;  Location: [facility] INVASIVE CV LAB;  Service: Cardiovascular;  Laterality: N/A;   distal rca 3.0x16 and 3.0x8 synergy   CESAREAN SECTION  1989   CORONARY ANGIOPLASTY WITH STENT PLACEMENT  08/06/2016   "2 stents"   CORONARY ARTERY BYPASS GRAFT N/A 05/17/2018   Procedure: CORONARY ARTERY BYPASS GRAFTING (CABG) X 2 WITH ENDOSCOPIC HARVESTING OF RIGHT SAPHENOUS VEIN. LIMA TO LAD. SVG TO RCA;  Surgeon: Delight Ovens, MD;  Location: [facility] OR;  Service: Open Heart Surgery;  Laterality: N/A;   ETHMOIDECTOMY Right 07/28/2021   Procedure: ETHMOIDECTOMY;  Surgeon: Newman Pies, MD;  Location: [facility];  Service: ENT;  Laterality: Right;   FRONTAL SINUS EXPLORATION Right 07/28/2021   Procedure: FRONTAL SINUS EXPLORATION;  Surgeon: Newman Pies, MD;  Location: [facility];  Service: ENT;  Laterality: Right;   HYSTEROSCOPY DIAGNOSTIC  1990s   JOINT REPLACEMENT     KNEE ARTHROSCOPY Left 1996   MAXILLARY ANTROSTOMY Right 07/28/2021   Procedure: MAXILLARY ANTROSTOMY WITH TISSUE REMOVAL;  Surgeon: Newman Pies, MD;  Location: [facility];  Service: ENT;  Laterality: Right;    NASAL SINUS SURGERY  1977   NASAL SINUS SURGERY Right 07/28/2021   Procedure: ENDOSCOPIC SINUS SURGERY WITH STEALTH NAVIGATION;  Surgeon: Newman Pies, MD;  Location: [facility];  Service: ENT;  Laterality: Right;   RIGHT/LEFT HEART CATH AND CORONARY ANGIOGRAPHY N/A 04/01/2018   Procedure: RIGHT/LEFT HEART CATH AND CORONARY ANGIOGRAPHY;  Surgeon: Lyn Records, MD;  Location: [facility] INVASIVE CV LAB;  Service: Cardiovascular;  Laterality: N/A;   TEE WITHOUT CARDIOVERSION N/A 05/17/2018   Procedure: TRANSESOPHAGEAL ECHOCARDIOGRAM (TEE);  Surgeon: Delight Ovens, MD;  Location: [facility] OR;  Service: Open Heart Surgery;  Laterality: N/A;   TOTAL HIP ARTHROPLASTY Left 04/23/2017   Procedure: LEFT TOTAL HIP ARTHROPLASTY ANTERIOR APPROACH;  Surgeon: Kathryne Hitch, MD;  Location: [facility] ORS;  Service: Orthopedics;  Laterality: Left;   TOTAL HIP ARTHROPLASTY Right 10/17/2021   Procedure: TOTAL HIP ARTHROPLASTY ANTERIOR APPROACH;  Surgeon: Kathryne Hitch, MD;  Location: [facility] ORS;  Service: Orthopedics;  Laterality: Right;   WEDGE RESECTION Left 05/17/2018   Procedure: LEFT UPPER LOBE LUNG WEDGE RESECTION;  Surgeon: Delight Ovens, MD;  Location: [facility] OR;  Service: Open Heart Surgery;  Laterality: Left;    REVIEW OF SYSTEMS:  A comprehensive review of systems was negative.   PHYSICAL EXAMINATION: General appearance: alert, cooperative, and no distress Head: Normocephalic, without obvious abnormality, atraumatic Neck: no adenopathy, no JVD, supple, symmetrical, trachea midline, and thyroid not enlarged, symmetric, no tenderness/mass/nodules Lymph nodes: Cervical, supraclavicular, and axillary nodes normal. Resp: clear to auscultation bilaterally Back: symmetric, no curvature. ROM normal. No CVA tenderness. Cardio: regular rate and rhythm, S1, S2 normal, no murmur, click, rub or gallop GI: soft, non-tender; bowel sounds normal; no masses,  no organomegaly Extremities: extremities  normal, atraumatic, no cyanosis or edema  ECOG PERFORMANCE STATUS: 1 - Symptomatic but completely ambulatory  Blood pressure 131/71, pulse 82, temperature 97.8 F (36.6 C), temperature source Temporal, resp. rate 17, height 5\' 2"  (1.575 m), weight 175 lb (79.4 kg), SpO2 100%.  LABORATORY DATA: Lab Results  Component Value Date   WBC 8.5 08/09/2023   HGB 15.8 (H) 08/09/2023   HCT 46.9 (H) 08/09/2023   MCV 93.8 08/09/2023   PLT 223 08/09/2023      Chemistry      Component Value Date/Time   NA 139 08/09/2023 1003   NA 136 10/17/2019  0914   K 4.4 08/09/2023 1003   CL 102 08/09/2023 1003   CO2 30 08/09/2023 1003   BUN 13 08/09/2023 1003   BUN 19 10/17/2019 0914   CREATININE 0.89 08/09/2023 1003      Component Value Date/Time   CALCIUM 9.6 08/09/2023 1003   ALKPHOS 85 08/09/2023 1003   AST 13 (L) 08/09/2023 1003   ALT 12 08/09/2023 1003   BILITOT 0.7 08/09/2023 1003       RADIOGRAPHIC STUDIES: No results found.   ASSESSMENT AND PLAN: This is a very pleasant 67 years old white female diagnosed with a stage Ia non-small cell lung cancer status post wedge resection of the left upper lobe in September 2019 under the care of Dr. Tyrone Sage. The patient has been on observation since that time and she is feeling fine. She had repeat CT scan of the chest performed recently.  The final report is still pending but I personally and independently reviewed the scan images and I do not see any clear evidence for disease recurrence but I will wait for the final report for confirmation.    Stage 1A Non-Small Cell Lung Cancer (NSCLC) Diagnosed in September 2019. Left upper lobe resection performed by Dr. Jaynie Collins. Annual monitoring since then. Recent scan reviewed shows no concerning findings, but awaiting final radiologist report. Discussed the possibility of this being the last year of monitoring if the scan is clear. Explained that ground glass opacities seen in previous scans could be  inflammation or early cancer, requiring annual scans to monitor for growth. - Await final radiologist report on recent scan - If scan is clear, consider discontinuing annual monitoring - Advise to contact the clinic if any new symptoms arise  Rib Fractures and Pneumothorax Sustained five fractured ribs and a pneumothorax from a fall in August. Required chest tube insertion and experienced subcutaneous emphysema from neck to knees. Emphasized the need for follow-up with primary care physician for ongoing monitoring and management of rib fractures and pneumothorax recovery. - Ensure follow-up with primary care physician for ongoing monitoring and management of rib fractures and pneumothorax recovery  Hypoglycemia Recent fall in August due to hypoglycemia after weight loss and medication adjustment. Blood sugar levels dropped, leading to a fall and subsequent injuries. Discussed the importance of monitoring blood sugar levels closely and adjusting diabetes medication as per primary care physician's recommendations. - Monitor blood sugar levels closely - Adjust diabetes medication as per primary care physician's recommendations  General Health Maintenance Discussed the importance of regular monitoring and imaging, especially in light of COVID-19. Advised to maintain close follow-up with primary care physician. Emphasized the need for annual imaging or x-ray as recommended by primary care physician. - Ensure annual imaging or x-ray as recommended by primary care physician - Maintain regular follow-up appointments with primary care physician  Follow-up - Schedule follow-up appointment with new primary care physician in January - Ensure any new symptoms or concerns are promptly addressed with healthcare providers.   The patient was advised to call immediately if she has any other concerning symptoms in the interval. The patient voices understanding of current disease status and treatment options and  is in agreement with the current care plan. All questions were answered. The patient knows to call the clinic with any problems, questions or concerns. We can certainly see the patient much sooner if necessary.   Disclaimer: This note was dictated with voice recognition software. Similar sounding words can inadvertently be transcribed and may not be corrected  upon review.

## 2023-08-20 ENCOUNTER — Encounter: Payer: Self-pay | Admitting: Cardiology

## 2023-08-20 ENCOUNTER — Ambulatory Visit: Payer: PPO | Attending: Cardiology | Admitting: Cardiology

## 2023-08-20 VITALS — BP 124/70 | HR 68 | Resp 16 | Ht 62.0 in | Wt 177.0 lb

## 2023-08-20 DIAGNOSIS — T466X5D Adverse effect of antihyperlipidemic and antiarteriosclerotic drugs, subsequent encounter: Secondary | ICD-10-CM

## 2023-08-20 DIAGNOSIS — I10 Essential (primary) hypertension: Secondary | ICD-10-CM

## 2023-08-20 DIAGNOSIS — N1831 Chronic kidney disease, stage 3a: Secondary | ICD-10-CM

## 2023-08-20 DIAGNOSIS — I2510 Atherosclerotic heart disease of native coronary artery without angina pectoris: Secondary | ICD-10-CM

## 2023-08-20 DIAGNOSIS — G720 Drug-induced myopathy: Secondary | ICD-10-CM

## 2023-08-20 DIAGNOSIS — Z952 Presence of prosthetic heart valve: Secondary | ICD-10-CM | POA: Diagnosis not present

## 2023-08-20 DIAGNOSIS — E7800 Pure hypercholesterolemia, unspecified: Secondary | ICD-10-CM

## 2023-08-20 DIAGNOSIS — E1122 Type 2 diabetes mellitus with diabetic chronic kidney disease: Secondary | ICD-10-CM

## 2023-08-20 NOTE — Progress Notes (Unsigned)
Cardiology Office Note:  .   Date:  08/23/2023  ID:  Sarah Stevenson, DOB 26-Aug-1956, MRN [id number] PCP: Barbie Banner, MD  [facility] Providers Cardiologist:  Yates Decamp, MD   History of Present Illness: .   Sarah Stevenson is a 67 y.o. Caucasian female patient with history of coronary artery disease with stenting to RCA in 2016, underwent bioprosthetic aortic valve replacement and CABG x 2 with LIMA to LAD and SVG to RCA on 05/17/2018, found to have incidental lung cancer during CABG and underwent left upper lobe wedge resection, hypercholesterolemia, hypertension, diabetes mellitus with stage IIIa chronic kidney disease presents here for annual visit.  Discussed the use of AI scribe software for clinical note transcription with the patient, who gave verbal consent to proceed.  History of Present Illness   The patient, a retired Engineer, civil (consulting) with a history of coronary artery disease, aortic stenosis, lung cancer, and diabetes, presents for a routine follow-up. She underwent stent placement in 2016, followed by bypass surgery and two grafts in 2019 due to aortic stenosis. Incidentally, lung cancer was discovered during the surgery, which has since been resolved without any treatments. The patient reports being very active, walking and doing yard work regularly, amounting to about 18-20 miles per week. She denies any chest pain, dyspnea, or heart racing symptoms.  In August of the current year, the patient fell and fractured five ribs on the left side, resulting in pneumothorax and subcutaneous emphysema. She attributes the fall to a drop in blood sugar due to poor eating habits on the day of the incident. The patient manages her diabetes with Trulicity, not for weight loss but for diabetes control. She reports a history of statin myopathy and is currently on Repatha for cholesterol management.  The patient also mentions a recent weight loss and a decrease in her A1c from 6.7 to 5.8. She is  currently taking care of three grandchildren and shares responsibilities with her ex-husband.       Review of Systems  Cardiovascular:  Negative for chest pain, dyspnea on exertion and leg swelling.    Labs   Lab Results  Component Value Date   CHOL 115 08/23/2023   HDL 30 (L) 08/23/2023   LDLCALC 56 08/23/2023   TRIG 171 (H) 08/23/2023   CHOLHDL 3.8 08/23/2023     Lab Results  Component Value Date   NA 139 08/09/2023   K 4.4 08/09/2023   CO2 30 08/09/2023   GLUCOSE 96 08/09/2023   BUN 13 08/09/2023   CREATININE 0.89 08/09/2023   CALCIUM 9.6 08/09/2023   GFRNONAA >60 08/09/2023      Latest Ref Rng & Units 08/09/2023   10:03 AM 04/16/2023    5:18 AM 04/15/2023    2:27 AM  BMP  Glucose 70 - 99 mg/dL 96  562  130   BUN 8 - 23 mg/dL 13  21  29    Creatinine 0.44 - 1.00 mg/dL 8.65  7.84  6.96   Sodium 135 - 145 mmol/L 139  134  136   Potassium 3.5 - 5.1 mmol/L 4.4  3.9  4.1   Chloride 98 - 111 mmol/L 102  104  103   CO2 22 - 32 mmol/L 30  20  24    Calcium 8.9 - 10.3 mg/dL 9.6  8.1  8.3       Latest Ref Rng & Units 08/09/2023   10:03 AM 04/15/2023    2:27 AM 04/14/2023   10:20  AM  CBC  WBC 4.0 - 10.5 K/uL 8.5  9.1  12.9   Hemoglobin 12.0 - 15.0 g/dL 60.4  54.0  98.1   Hematocrit 36.0 - 46.0 % 46.9  38.2  37.7   Platelets 150 - 400 K/uL 223  177  205    External Labs:  Labs 03/30/2023:  A1c 6.2%.  Labs 09/10/2022:  Total cholesterol 107, triglycerides 120, HDL 29, LDL 67.  Physical Exam:   VS:  BP 124/70 (BP Location: Left Arm, Patient Position: Sitting, Cuff Size: Normal)   Pulse 68   Resp 16   Ht 5\' 2"  (1.575 m)   Wt 177 lb (80.3 kg)   SpO2 97%   BMI 32.37 kg/m    Wt Readings from Last 3 Encounters:  08/20/23 177 lb (80.3 kg)  08/11/23 175 lb (79.4 kg)  04/17/23 188 lb 15 oz (85.7 kg)     Physical Exam Neck:     Vascular: Carotid bruit (bilateral) present. No JVD.  Cardiovascular:     Rate and Rhythm: Normal rate and regular rhythm.     Pulses: Intact  distal pulses.     Heart sounds: S1 normal and S2 normal. Murmur heard.     Early systolic murmur is present with a grade of 2/6 at the upper right sternal border.     No gallop.  Pulmonary:     Effort: Pulmonary effort is normal.     Breath sounds: Normal breath sounds.  Abdominal:     General: Bowel sounds are normal.     Palpations: Abdomen is soft.  Musculoskeletal:     Right lower leg: No edema.     Left lower leg: No edema.     Studies Reviewed: .     Carotid artery duplex 05/12/2018: Right Carotid: Velocities in the right ICA are consistent with a 1-39% stenosis.  Left Carotid: Velocities in the left ICA are consistent with a 1-39%  stenosis.  Vertebrals: Bilateral vertebral arteries demonstrate antegrade flow.   ECHOCARDIOGRAM COMPLETE 07/02/2022  1. Left ventricular ejection fraction, by estimation, is 60 to 65%. The left ventricle has normal function. The left ventricle has no regional wall motion abnormalities. There is moderate concentric left ventricular hypertrophy. Left ventricular diastolic parameters are consistent with Grade I diastolic dysfunction (impaired relaxation). 2. Right ventricular systolic function is normal. The right ventricular size is normal. There is normal pulmonary artery systolic pressure. 3. Left atrial size was mild to moderately dilated. 4. The mitral valve is normal in structure. Trivial mitral valve regurgitation. No evidence of mitral stenosis. 5. Prosthetic AoV gradient essentially unchanged (was 17 on last study. Measures today) . The aortic valve has been repaired/replaced. Aortic valve regurgitation is not visualized. Mild aortic valve stenosis. There is a 21 mm pericardial bioprosthesis valve present in the aortic position. Procedure Date: 05/17/2018. Aortic valve area, by VTI measures 1.07 cm. Aortic valve mean gradient measures 19.0 mmHg. Aortic valve Vmax measures 2.87 m/s. 6. The inferior vena cava is normal in size with  greater than 50% respiratory variability, suggesting right atrial pressure of 3 mmHg.  EKG:   EKG 04/14/2023: Normal sinus rhythm at rate of 81 bpm, left anterior fascicular block.  IVCD, borderline criteria for LVH.  No evidence of ischemia.  Compared to 05/23/2018, high lateral T wave inversion no longer present.  Medications and allergies    Allergies  Allergen Reactions   Asa [Aspirin] Hives and Rash   Brilinta [Ticagrelor] Shortness Of Breath  Lisinopril Cough   Blue Dyes (Parenteral) Hives   Corn-Containing Products Hives   Nsaids Hives and Rash   Tea Hives   Rosuvastatin     myalgias   Statins    Zetia [Ezetimibe]     myalgias   Isosulfan Blue Rash   Other Itching and Rash    CHG soap     Current Outpatient Medications:    Acetaminophen (TYLENOL DISSOLVE PACKS) 500 MG PACK, Take 1,000 mg by mouth every 6 (six) hours as needed (pain)., Disp: , Rfl:    acetaminophen (TYLENOL) 325 MG tablet, Take 650 mg by mouth every 6 (six) hours as needed for moderate pain., Disp: , Rfl:    amoxicillin (AMOXIL) 500 MG capsule, Take 4 capsules (2,000 mg total) by mouth 1 hour before appointment., Disp: 16 capsule, Rfl: 0   carvedilol (COREG) 6.25 MG tablet, Take 1 tablet (6.25 mg total) by mouth 2 (two) times daily with a meal., Disp: 60 tablet, Rfl: 0   celecoxib (CELEBREX) 200 MG capsule, Take 1 capsule (200 mg total) by mouth daily as needed., Disp: 30 capsule, Rfl: 0   cetirizine (ZYRTEC) 10 MG tablet, Take 25 mg by mouth daily., Disp: , Rfl:    COLLAGEN PO, Take 1 Scoop by mouth daily. With biotin and vitamin c, Disp: , Rfl:    Dulaglutide (TRULICITY) 1.5 MG/0.5ML SOAJ, Inject 1.5 mg into the skin once a week., Disp: 6 mL, Rfl: 3   Evolocumab (REPATHA SURECLICK) 140 MG/ML SOAJ, Inject 140 mg into the skin every 14 (fourteen) days., Disp: 6 mL, Rfl: 3   famotidine (PEPCID) 20 MG tablet, Take 20 mg by mouth at bedtime., Disp: , Rfl:    fluticasone (FLONASE) 50 MCG/ACT nasal spray, Place  1 spray into both nostrils daily., Disp: , Rfl:    glipiZIDE (GLUCOTROL XL) 5 MG 24 hr tablet, Take 1 tablet (5 mg total) by mouth daily for diabetes., Disp: 90 tablet, Rfl: 3   glucose blood (ONETOUCH ULTRA) test strip, TEST BLOOD SUGAR ONCE A DAY, Disp: , Rfl:    losartan (COZAAR) 50 MG tablet, TAKE 1 TABLET BY MOUTH DAILY TO PRESERVE KIDNEY FUNCTION., Disp: 30 tablet, Rfl: 1   Menthol-Methyl Salicylate (MUSCLE RUB EX), Apply 1 application topically as needed (for pain)., Disp: , Rfl:    metFORMIN (GLUCOPHAGE) 850 MG tablet, Take 1 tablet (850 mg total) by mouth 2 (two) times daily to control diabetes., Disp: 180 tablet, Rfl: 3   methocarbamol (ROBAXIN) 500 MG tablet, Take 1 tablet (500 mg total) by mouth every 8 (eight) hours as needed for up to 30 doses for muscle spasms., Disp: 30 tablet, Rfl: 0   montelukast (SINGULAIR) 10 MG tablet, Take 1 tablet (10 mg total) by mouth daily., Disp: 30 tablet, Rfl: 5   oxyCODONE (OXY IR/ROXICODONE) 5 MG immediate release tablet, Take 1-2 tablets (5-10 mg total) by mouth every 4 (four) hours as needed for moderate pain or severe pain (5mg  moderate, 10mg  severe)., Disp: 30 tablet, Rfl: 0   oxyCODONE (OXY IR/ROXICODONE) 5 MG immediate release tablet, Take 2 tablets (10 mg total) by mouth every 4 (four) hours as needed for moderate pain (4-6) or severe pain (7-10) for up to 6 days. (1 tablet for moderate pain, 2 tablets for severe pain)., Disp: 30 tablet, Rfl: 0   Povidone (IVIZIA DRY EYES OP), Apply 2 drops to eye daily., Disp: , Rfl:    prasugrel (EFFIENT) 10 MG TABS tablet, Take 1 tablet (10 mg total) by  mouth daily., Disp: 30 tablet, Rfl: 0   sertraline (ZOLOFT) 50 MG tablet, Take 1 tablet (50 mg total) by mouth daily to control anxiety. (Patient taking differently: Take 25 mg by mouth daily.), Disp: 90 tablet, Rfl: 3   ASSESSMENT AND PLAN: .      ICD-10-CM   1. Coronary artery disease involving native coronary artery of native heart without angina pectoris   I25.10     2. H/O aortic valve replacement (21 mm Magna Ease pericardial valve) and CABG x 2 (LIMA to LAD, SVG to RCA) 2019  Z95.2     3. Pure hypercholesterolemia  E78.00 Lipid Profile    4. Primary hypertension  I10     5. Statin myopathy  G72.0    T46.6X5A     6. Type 2 diabetes mellitus with stage 3a chronic kidney disease, without long-term current use of insulin (HCC)  E11.22    N18.31      Assessment and Plan    Coronary Artery Disease History of stent placement in 2016 and bypass surgery in 2019. Currently asymptomatic with good exercise tolerance. On Carvedilol 6.25mg  BID and Losartan 50mg  daily. -Continue current medications. -No need for stress test or echocardiogram at this time due to lack of symptoms.  Aortic Valve Replacement Mild stenosis noted on last year's echocardiogram, but clinically insignificant on auscultation. -Continue current management. -Reminded patient to take prophylactic antibiotics before dental procedures.  Diabetes Mellitus Well-controlled on Trulicity. Mild chronic kidney disease (stage 3A) noted on previous labs, but recent labs normal. -Continue Trulicity. -Continue monitoring kidney function.  Hyperlipidemia Patient has statin myopathy and is currently on Repatha with good control of lipid levels. -Continue Repatha. -Consider switching to Plavix if cost-effective after patient checks with pharmacy.  History of Lung Cancer No current treatment, recent labs and CT scan reviewed by Dr. Jerolyn Center who plans to discharge patient from care if all results are satisfactory. -Continue current follow-up plan with Dr. Jerolyn Center.  Fall with Rib Fractures and Pneumothorax Occurred in August of this year, currently causing some pain but no cardiac symptoms. -No specific plan needed at this time.  Follow-up in 1 year.      Although she has not had any cardiac stress test since bypass surgery in 2019, she remains completely asymptomatic and active  hence do not think she needs any further evaluation with regard to this.  Risk factors are well-controlled.  Signed,  Yates Decamp, MD, [facility] 08/23/2023, 9:19 PM [facility] [address] [place], [place] [address] Phone: [phone]. Fax:  [phone]

## 2023-08-20 NOTE — Patient Instructions (Addendum)
Medication Instructions:  Your physician recommends that you continue on your current medications as directed. Please refer to the Current Medication list given to you today.  *If you need a refill on your cardiac medications before your next appointment, please call your pharmacy*   Lab Work: Have lipid profile checked today at [facility] on the first floor If you have labs (blood work) drawn today and your tests are completely normal, you will receive your results only by: MyChart Message (if you have MyChart) OR A paper copy in the mail If you have any lab test that is abnormal or we need to change your treatment, we will call you to review the results.   Testing/Procedures: none   Follow-Up: At [facility], you and your health needs are our priority.  As part of our continuing mission to provide you with exceptional heart care, we have created designated Provider Care Teams.  These Care Teams include your primary Cardiologist (physician) and Advanced Practice Providers (APPs -  Physician Assistants and Nurse Practitioners) who all work together to provide you with the care you need, when you need it.  We recommend signing up for the patient portal called "MyChart".  Sign up information is provided on this After Visit Summary.  MyChart is used to connect with patients for Virtual Visits (Telemedicine).  Patients are able to view lab/test results, encounter notes, upcoming appointments, etc.  Non-urgent messages can be sent to your provider as well.   To learn more about what you can do with MyChart, go to [email or web address].    Your next appointment:   12 month(s)  Provider:   Yates Decamp, MD     Other Instructions

## 2023-08-23 LAB — LIPID PANEL
Chol/HDL Ratio: 3.8 {ratio} (ref 0.0–4.4)
Cholesterol, Total: 115 mg/dL (ref 100–199)
HDL: 30 mg/dL — ABNORMAL LOW (ref >39)
LDL Chol Calc (NIH): 56 mg/dL (ref 0–99)
Triglycerides: 171 mg/dL — ABNORMAL HIGH (ref 0–149)
VLDL Cholesterol Cal: 29 mg/dL (ref 5–40)

## 2023-08-26 ENCOUNTER — Other Ambulatory Visit: Payer: Self-pay

## 2023-08-26 ENCOUNTER — Other Ambulatory Visit (HOSPITAL_BASED_OUTPATIENT_CLINIC_OR_DEPARTMENT_OTHER): Payer: Self-pay

## 2023-08-26 ENCOUNTER — Other Ambulatory Visit: Payer: Self-pay | Admitting: Cardiology

## 2023-08-26 MED ORDER — CELECOXIB 200 MG PO CAPS
ORAL_CAPSULE | ORAL | 0 refills | Status: DC
  Filled 2023-08-26: qty 30, 30d supply, fill #0

## 2023-08-26 MED ORDER — PRASUGREL HCL 10 MG PO TABS
10.0000 mg | ORAL_TABLET | Freq: Every day | ORAL | 3 refills | Status: DC
  Filled 2023-08-26: qty 90, 90d supply, fill #0
  Filled 2023-11-25: qty 90, 90d supply, fill #1
  Filled 2024-02-22: qty 90, 90d supply, fill #2
  Filled 2024-05-28: qty 90, 90d supply, fill #3

## 2023-09-09 ENCOUNTER — Other Ambulatory Visit: Payer: Self-pay | Admitting: Cardiovascular Disease

## 2023-09-09 ENCOUNTER — Other Ambulatory Visit: Payer: Self-pay

## 2023-09-10 ENCOUNTER — Other Ambulatory Visit (HOSPITAL_BASED_OUTPATIENT_CLINIC_OR_DEPARTMENT_OTHER): Payer: Self-pay

## 2023-09-10 MED ORDER — CARVEDILOL 6.25 MG PO TABS
6.2500 mg | ORAL_TABLET | Freq: Two times a day (BID) | ORAL | 3 refills | Status: DC
  Filled 2023-09-10: qty 90, 45d supply, fill #0
  Filled 2023-10-23: qty 90, 45d supply, fill #1
  Filled 2023-12-16: qty 90, 45d supply, fill #2
  Filled 2024-01-25: qty 90, 45d supply, fill #3

## 2023-09-12 ENCOUNTER — Other Ambulatory Visit (HOSPITAL_BASED_OUTPATIENT_CLINIC_OR_DEPARTMENT_OTHER): Payer: Self-pay

## 2023-09-13 ENCOUNTER — Other Ambulatory Visit (HOSPITAL_BASED_OUTPATIENT_CLINIC_OR_DEPARTMENT_OTHER): Payer: Self-pay

## 2023-09-13 MED ORDER — CELECOXIB 200 MG PO CAPS
200.0000 mg | ORAL_CAPSULE | Freq: Every day | ORAL | 0 refills | Status: DC | PRN
  Filled 2023-09-13 – 2023-09-28 (×3): qty 30, 30d supply, fill #0

## 2023-09-20 ENCOUNTER — Other Ambulatory Visit (HOSPITAL_BASED_OUTPATIENT_CLINIC_OR_DEPARTMENT_OTHER): Payer: Self-pay

## 2023-09-28 ENCOUNTER — Other Ambulatory Visit: Payer: Self-pay | Admitting: Physician Assistant

## 2023-09-29 ENCOUNTER — Other Ambulatory Visit (HOSPITAL_BASED_OUTPATIENT_CLINIC_OR_DEPARTMENT_OTHER): Payer: Self-pay

## 2023-09-29 ENCOUNTER — Other Ambulatory Visit: Payer: Self-pay

## 2023-09-29 MED ORDER — LOSARTAN POTASSIUM 50 MG PO TABS
50.0000 mg | ORAL_TABLET | Freq: Every day | ORAL | 3 refills | Status: DC
  Filled 2023-09-29: qty 90, 90d supply, fill #0
  Filled 2023-12-30: qty 90, 90d supply, fill #1
  Filled 2024-03-28: qty 90, 90d supply, fill #2
  Filled 2024-06-26: qty 90, 90d supply, fill #3

## 2023-10-23 ENCOUNTER — Other Ambulatory Visit (HOSPITAL_BASED_OUTPATIENT_CLINIC_OR_DEPARTMENT_OTHER): Payer: Self-pay

## 2023-10-25 ENCOUNTER — Other Ambulatory Visit (HOSPITAL_BASED_OUTPATIENT_CLINIC_OR_DEPARTMENT_OTHER): Payer: Self-pay

## 2023-10-25 ENCOUNTER — Other Ambulatory Visit: Payer: Self-pay

## 2023-10-25 MED ORDER — SERTRALINE HCL 50 MG PO TABS
50.0000 mg | ORAL_TABLET | Freq: Every day | ORAL | 0 refills | Status: DC
  Filled 2023-10-25: qty 90, 90d supply, fill #0

## 2023-10-26 ENCOUNTER — Other Ambulatory Visit (HOSPITAL_BASED_OUTPATIENT_CLINIC_OR_DEPARTMENT_OTHER): Payer: Self-pay

## 2023-10-26 ENCOUNTER — Telehealth (INDEPENDENT_AMBULATORY_CARE_PROVIDER_SITE_OTHER): Payer: Self-pay | Admitting: Otolaryngology

## 2023-10-26 NOTE — Telephone Encounter (Signed)
Confirmed appt & location [id number] afm

## 2023-10-27 ENCOUNTER — Ambulatory Visit (INDEPENDENT_AMBULATORY_CARE_PROVIDER_SITE_OTHER): Payer: PPO

## 2023-10-28 ENCOUNTER — Other Ambulatory Visit (HOSPITAL_BASED_OUTPATIENT_CLINIC_OR_DEPARTMENT_OTHER): Payer: Self-pay

## 2023-10-28 ENCOUNTER — Other Ambulatory Visit: Payer: Self-pay

## 2023-10-28 MED ORDER — CELECOXIB 200 MG PO CAPS
200.0000 mg | ORAL_CAPSULE | Freq: Every day | ORAL | 0 refills | Status: DC | PRN
  Filled 2023-10-28: qty 30, 30d supply, fill #0

## 2023-10-29 ENCOUNTER — Other Ambulatory Visit (HOSPITAL_BASED_OUTPATIENT_CLINIC_OR_DEPARTMENT_OTHER): Payer: Self-pay

## 2023-11-09 ENCOUNTER — Telehealth (INDEPENDENT_AMBULATORY_CARE_PROVIDER_SITE_OTHER): Payer: Self-pay | Admitting: Otolaryngology

## 2023-11-09 NOTE — Telephone Encounter (Signed)
 Reminder Call:  Confirmed time and location w/patient- [address] [place], [place] [address]

## 2023-11-10 ENCOUNTER — Ambulatory Visit (INDEPENDENT_AMBULATORY_CARE_PROVIDER_SITE_OTHER): Payer: PPO | Admitting: Otolaryngology

## 2023-11-10 ENCOUNTER — Encounter (INDEPENDENT_AMBULATORY_CARE_PROVIDER_SITE_OTHER): Payer: Self-pay

## 2023-11-10 VITALS — BP 148/84 | HR 73 | Ht 62.5 in | Wt 170.0 lb

## 2023-11-10 DIAGNOSIS — J320 Chronic maxillary sinusitis: Secondary | ICD-10-CM | POA: Diagnosis not present

## 2023-11-10 DIAGNOSIS — R0981 Nasal congestion: Secondary | ICD-10-CM

## 2023-11-10 DIAGNOSIS — J321 Chronic frontal sinusitis: Secondary | ICD-10-CM | POA: Diagnosis not present

## 2023-11-10 DIAGNOSIS — J322 Chronic ethmoidal sinusitis: Secondary | ICD-10-CM

## 2023-11-12 DIAGNOSIS — J320 Chronic maxillary sinusitis: Secondary | ICD-10-CM | POA: Insufficient documentation

## 2023-11-12 DIAGNOSIS — J321 Chronic frontal sinusitis: Secondary | ICD-10-CM | POA: Insufficient documentation

## 2023-11-12 DIAGNOSIS — J322 Chronic ethmoidal sinusitis: Secondary | ICD-10-CM | POA: Insufficient documentation

## 2023-11-12 NOTE — Progress Notes (Signed)
 Patient ID: Sarah Stevenson, female   DOB: 1956/02/17, 68 y.o.   MRN: [id number]  Follow up: Chronic right frontal, ethmoid, maxillary sinusitis and polyposis  HPI: The patient is a 68 year old female who returns today for her follow-up evaluation.  She has a history of chronic right-sided maxillary, ethmoid, and frontal sinusitis.  She underwent endoscopic sinus surgery in November 2022.  The patient returns today reporting no difficulty since her last visit.  She has not noted any recurrent sinusitis.  She reports occasional mild nasal congestion.  She denies any facial pain, fever, or visual change.  Exam: General: Communicates without difficulty, well nourished, no acute distress. Head: Normocephalic, no evidence injury, no tenderness, facial buttresses intact without stepoff. Face/sinus: No tenderness to palpation and percussion. Facial movement is normal and symmetric. Eyes: PERRL, EOMI. No scleral icterus, conjunctivae clear. Neuro: CN II exam reveals vision grossly intact.  No nystagmus at any point of gaze. Ears: Auricles well formed without lesions.  Ear canals are intact without mass or lesion.  No erythema or edema is appreciated.  The TMs are intact without fluid. Nose: External evaluation reveals normal support and skin without lesions.  Dorsum is intact.  Anterior rhinoscopy reveals congested mucosa over anterior aspect of inferior turbinates and intact septum.  No purulence noted. Oral:  Oral cavity and oropharynx are intact, symmetric, without erythema or edema.  Mucosa is moist without lesions. Neck: Full range of motion without pain.  There is no significant lymphadenopathy.  No masses palpable.  Thyroid bed within normal limits to palpation.  Parotid glands and submandibular glands equal bilaterally without mass.  Trachea is midline. Neuro:  CN 2-12 grossly intact.   Assessment: 1.  The patient is doing well status post endoscopic sinus surgery. 2.  Her right maxillary, frontal, and  ethmoid openings are widely patent secondary to the surgery. 3.  No recurrent polyps or infection is noted today. 4.  Mild nasal mucosal congestion is noted.  Plan: 1.  The physical exam findings are reviewed with the patient. 2.  Continue Flonase and Zyrtec daily. 3.  Nasal saline irrigation as needed. 4.  The patient will return for reevaluation in 6 months.

## 2023-11-25 ENCOUNTER — Other Ambulatory Visit (HOSPITAL_BASED_OUTPATIENT_CLINIC_OR_DEPARTMENT_OTHER): Payer: Self-pay

## 2023-11-26 ENCOUNTER — Other Ambulatory Visit (HOSPITAL_BASED_OUTPATIENT_CLINIC_OR_DEPARTMENT_OTHER): Payer: Self-pay

## 2023-11-26 ENCOUNTER — Other Ambulatory Visit: Payer: Self-pay

## 2023-11-26 MED ORDER — CELECOXIB 200 MG PO CAPS
200.0000 mg | ORAL_CAPSULE | Freq: Every day | ORAL | 0 refills | Status: DC | PRN
  Filled 2023-11-26: qty 30, 30d supply, fill #0

## 2023-12-27 ENCOUNTER — Other Ambulatory Visit: Payer: Self-pay | Admitting: Family Medicine

## 2023-12-27 DIAGNOSIS — Z1231 Encounter for screening mammogram for malignant neoplasm of breast: Secondary | ICD-10-CM

## 2023-12-30 ENCOUNTER — Other Ambulatory Visit (HOSPITAL_BASED_OUTPATIENT_CLINIC_OR_DEPARTMENT_OTHER): Payer: Self-pay

## 2023-12-31 ENCOUNTER — Other Ambulatory Visit: Payer: Self-pay

## 2024-01-03 ENCOUNTER — Other Ambulatory Visit (HOSPITAL_BASED_OUTPATIENT_CLINIC_OR_DEPARTMENT_OTHER): Payer: Self-pay

## 2024-01-03 MED ORDER — CELECOXIB 200 MG PO CAPS
200.0000 mg | ORAL_CAPSULE | Freq: Every day | ORAL | 0 refills | Status: DC | PRN
  Filled 2024-01-03: qty 30, 30d supply, fill #0

## 2024-01-05 ENCOUNTER — Ambulatory Visit
Admission: RE | Admit: 2024-01-05 | Discharge: 2024-01-05 | Disposition: A | Discharge status: Home / Self Care | Source: Ambulatory Visit

## 2024-01-05 DIAGNOSIS — Z1231 Encounter for screening mammogram for malignant neoplasm of breast: Secondary | ICD-10-CM

## 2024-01-21 ENCOUNTER — Other Ambulatory Visit (HOSPITAL_BASED_OUTPATIENT_CLINIC_OR_DEPARTMENT_OTHER): Payer: Self-pay

## 2024-01-21 ENCOUNTER — Other Ambulatory Visit: Payer: Self-pay

## 2024-01-21 MED ORDER — SERTRALINE HCL 50 MG PO TABS
50.0000 mg | ORAL_TABLET | Freq: Every day | ORAL | 0 refills | Status: DC
  Filled 2024-01-21: qty 90, 90d supply, fill #0

## 2024-01-21 MED ORDER — MONTELUKAST SODIUM 10 MG PO TABS
10.0000 mg | ORAL_TABLET | Freq: Every day | ORAL | 5 refills | Status: DC
  Filled 2024-01-21: qty 30, 30d supply, fill #0
  Filled 2024-02-22: qty 30, 30d supply, fill #1
  Filled 2024-03-23: qty 30, 30d supply, fill #2
  Filled 2024-04-23: qty 30, 30d supply, fill #3
  Filled 2024-05-28: qty 30, 30d supply, fill #4
  Filled 2024-06-26: qty 30, 30d supply, fill #5

## 2024-01-25 ENCOUNTER — Other Ambulatory Visit (HOSPITAL_BASED_OUTPATIENT_CLINIC_OR_DEPARTMENT_OTHER): Payer: Self-pay

## 2024-01-26 ENCOUNTER — Other Ambulatory Visit: Payer: Self-pay

## 2024-01-26 ENCOUNTER — Other Ambulatory Visit (HOSPITAL_BASED_OUTPATIENT_CLINIC_OR_DEPARTMENT_OTHER): Payer: Self-pay

## 2024-01-26 MED ORDER — CELECOXIB 200 MG PO CAPS
200.0000 mg | ORAL_CAPSULE | Freq: Every day | ORAL | 0 refills | Status: DC | PRN
  Filled 2024-01-26: qty 30, 30d supply, fill #0

## 2024-02-22 ENCOUNTER — Other Ambulatory Visit (HOSPITAL_BASED_OUTPATIENT_CLINIC_OR_DEPARTMENT_OTHER): Payer: Self-pay

## 2024-02-23 ENCOUNTER — Other Ambulatory Visit: Payer: Self-pay

## 2024-02-23 ENCOUNTER — Other Ambulatory Visit (HOSPITAL_BASED_OUTPATIENT_CLINIC_OR_DEPARTMENT_OTHER): Payer: Self-pay

## 2024-02-23 MED ORDER — CELECOXIB 200 MG PO CAPS
200.0000 mg | ORAL_CAPSULE | Freq: Every day | ORAL | 0 refills | Status: DC | PRN
  Filled 2024-02-23: qty 30, 30d supply, fill #0

## 2024-03-13 ENCOUNTER — Other Ambulatory Visit (HOSPITAL_BASED_OUTPATIENT_CLINIC_OR_DEPARTMENT_OTHER): Payer: Self-pay

## 2024-03-13 ENCOUNTER — Other Ambulatory Visit: Payer: Self-pay | Admitting: Cardiology

## 2024-03-14 ENCOUNTER — Other Ambulatory Visit (HOSPITAL_BASED_OUTPATIENT_CLINIC_OR_DEPARTMENT_OTHER): Payer: Self-pay

## 2024-03-14 MED ORDER — METFORMIN HCL 850 MG PO TABS
850.0000 mg | ORAL_TABLET | Freq: Two times a day (BID) | ORAL | 0 refills | Status: AC
  Filled 2024-03-14: qty 180, 90d supply, fill #0

## 2024-03-15 ENCOUNTER — Other Ambulatory Visit (HOSPITAL_BASED_OUTPATIENT_CLINIC_OR_DEPARTMENT_OTHER): Payer: Self-pay

## 2024-03-15 ENCOUNTER — Other Ambulatory Visit: Payer: Self-pay

## 2024-03-15 MED ORDER — CARVEDILOL 6.25 MG PO TABS
6.2500 mg | ORAL_TABLET | Freq: Two times a day (BID) | ORAL | 1 refills | Status: DC
  Filled 2024-03-15: qty 90, 45d supply, fill #0
  Filled 2024-04-23: qty 90, 45d supply, fill #1

## 2024-03-23 ENCOUNTER — Other Ambulatory Visit (HOSPITAL_BASED_OUTPATIENT_CLINIC_OR_DEPARTMENT_OTHER): Payer: Self-pay

## 2024-03-23 ENCOUNTER — Other Ambulatory Visit: Payer: Self-pay

## 2024-03-23 MED ORDER — TRULICITY 1.5 MG/0.5ML ~~LOC~~ SOAJ
1.5000 mg | SUBCUTANEOUS | 0 refills | Status: DC
  Filled 2024-03-23: qty 6, 84d supply, fill #0

## 2024-03-23 MED ORDER — CELECOXIB 200 MG PO CAPS
200.0000 mg | ORAL_CAPSULE | Freq: Every day | ORAL | 0 refills | Status: AC | PRN
  Filled 2024-03-23: qty 30, 30d supply, fill #0

## 2024-04-23 ENCOUNTER — Other Ambulatory Visit (HOSPITAL_BASED_OUTPATIENT_CLINIC_OR_DEPARTMENT_OTHER): Payer: Self-pay

## 2024-04-24 ENCOUNTER — Other Ambulatory Visit (HOSPITAL_BASED_OUTPATIENT_CLINIC_OR_DEPARTMENT_OTHER): Payer: Self-pay

## 2024-04-24 ENCOUNTER — Other Ambulatory Visit: Payer: Self-pay

## 2024-04-24 MED ORDER — CELECOXIB 200 MG PO CAPS
200.0000 mg | ORAL_CAPSULE | Freq: Every day | ORAL | 1 refills | Status: AC
  Filled 2024-04-24: qty 90, 90d supply, fill #0
  Filled 2024-07-19: qty 90, 90d supply, fill #1

## 2024-04-27 ENCOUNTER — Other Ambulatory Visit: Payer: Self-pay | Admitting: Physician Assistant

## 2024-04-27 DIAGNOSIS — E7800 Pure hypercholesterolemia, unspecified: Secondary | ICD-10-CM

## 2024-04-27 DIAGNOSIS — I2510 Atherosclerotic heart disease of native coronary artery without angina pectoris: Secondary | ICD-10-CM

## 2024-04-28 ENCOUNTER — Other Ambulatory Visit (HOSPITAL_BASED_OUTPATIENT_CLINIC_OR_DEPARTMENT_OTHER): Payer: Self-pay

## 2024-04-28 ENCOUNTER — Other Ambulatory Visit: Payer: Self-pay

## 2024-04-28 MED ORDER — REPATHA SURECLICK 140 MG/ML ~~LOC~~ SOAJ
140.0000 mg | SUBCUTANEOUS | 1 refills | Status: DC
  Filled 2024-04-28: qty 6, 84d supply, fill #0
  Filled 2024-07-19: qty 6, 84d supply, fill #1

## 2024-05-01 ENCOUNTER — Other Ambulatory Visit (HOSPITAL_BASED_OUTPATIENT_CLINIC_OR_DEPARTMENT_OTHER): Payer: Self-pay

## 2024-05-01 MED ORDER — GLIPIZIDE ER 2.5 MG PO TB24
2.5000 mg | ORAL_TABLET | Freq: Every day | ORAL | 2 refills | Status: DC
  Filled 2024-06-26: qty 90, 90d supply, fill #0
  Filled 2024-09-19: qty 90, 90d supply, fill #1

## 2024-05-17 ENCOUNTER — Ambulatory Visit (INDEPENDENT_AMBULATORY_CARE_PROVIDER_SITE_OTHER): Admitting: Otolaryngology

## 2024-05-17 ENCOUNTER — Encounter (INDEPENDENT_AMBULATORY_CARE_PROVIDER_SITE_OTHER): Payer: Self-pay | Admitting: Otolaryngology

## 2024-05-17 VITALS — BP 123/76 | HR 72

## 2024-05-17 DIAGNOSIS — J321 Chronic frontal sinusitis: Secondary | ICD-10-CM

## 2024-05-17 DIAGNOSIS — J320 Chronic maxillary sinusitis: Secondary | ICD-10-CM | POA: Diagnosis not present

## 2024-05-17 DIAGNOSIS — J322 Chronic ethmoidal sinusitis: Secondary | ICD-10-CM | POA: Diagnosis not present

## 2024-05-17 DIAGNOSIS — R0981 Nasal congestion: Secondary | ICD-10-CM

## 2024-05-17 NOTE — Progress Notes (Unsigned)
 Patient ID: Sarah Stevenson, female   DOB: 1956/02/17, 68 y.o.   MRN: [id number]  Follow up: Chronic right frontal, ethmoid, maxillary sinusitis and polyposis  HPI: The patient is a 68 year old female who returns today for her follow-up evaluation.  She has a history of chronic right-sided maxillary, ethmoid, and frontal sinusitis.  She underwent endoscopic sinus surgery in November 2022.  The patient returns today reporting no difficulty since her last visit.  She has not noted any recurrent sinusitis.  She reports occasional mild nasal congestion.  She denies any facial pain, fever, or visual change.  Exam: General: Communicates without difficulty, well nourished, no acute distress. Head: Normocephalic, no evidence injury, no tenderness, facial buttresses intact without stepoff. Face/sinus: No tenderness to palpation and percussion. Facial movement is normal and symmetric. Eyes: PERRL, EOMI. No scleral icterus, conjunctivae clear. Neuro: CN II exam reveals vision grossly intact.  No nystagmus at any point of gaze. Ears: Auricles well formed without lesions.  Ear canals are intact without mass or lesion.  No erythema or edema is appreciated.  The TMs are intact without fluid. Nose: External evaluation reveals normal support and skin without lesions.  Dorsum is intact.  Anterior rhinoscopy reveals congested mucosa over anterior aspect of inferior turbinates and intact septum.  No purulence noted. Oral:  Oral cavity and oropharynx are intact, symmetric, without erythema or edema.  Mucosa is moist without lesions. Neck: Full range of motion without pain.  There is no significant lymphadenopathy.  No masses palpable.  Thyroid bed within normal limits to palpation.  Parotid glands and submandibular glands equal bilaterally without mass.  Trachea is midline. Neuro:  CN 2-12 grossly intact.   Assessment: 1.  The patient is doing well status post endoscopic sinus surgery. 2.  Her right maxillary, frontal, and  ethmoid openings are widely patent secondary to the surgery. 3.  No recurrent polyps or infection is noted today. 4.  Mild nasal mucosal congestion is noted.  Plan: 1.  The physical exam findings are reviewed with the patient. 2.  Continue Flonase and Zyrtec daily. 3.  Nasal saline irrigation as needed. 4.  The patient will return for reevaluation in 6 months.

## 2024-05-25 ENCOUNTER — Other Ambulatory Visit (HOSPITAL_BASED_OUTPATIENT_CLINIC_OR_DEPARTMENT_OTHER): Payer: Self-pay

## 2024-06-07 ENCOUNTER — Other Ambulatory Visit: Payer: Self-pay | Admitting: Cardiology

## 2024-06-07 ENCOUNTER — Other Ambulatory Visit (HOSPITAL_BASED_OUTPATIENT_CLINIC_OR_DEPARTMENT_OTHER): Payer: Self-pay

## 2024-06-07 ENCOUNTER — Other Ambulatory Visit: Payer: Self-pay

## 2024-06-07 MED ORDER — CARVEDILOL 6.25 MG PO TABS
6.2500 mg | ORAL_TABLET | Freq: Two times a day (BID) | ORAL | 0 refills | Status: DC
  Filled 2024-06-07: qty 90, 45d supply, fill #0

## 2024-06-07 MED ORDER — SERTRALINE HCL 50 MG PO TABS
50.0000 mg | ORAL_TABLET | Freq: Every day | ORAL | 1 refills | Status: AC
  Filled 2024-06-07: qty 90, 90d supply, fill #0
  Filled 2024-09-16: qty 90, 90d supply, fill #1

## 2024-06-07 MED ORDER — METFORMIN HCL 850 MG PO TABS
850.0000 mg | ORAL_TABLET | Freq: Two times a day (BID) | ORAL | 1 refills | Status: DC
  Filled 2024-06-07: qty 180, 90d supply, fill #0
  Filled 2024-09-10: qty 180, 90d supply, fill #1

## 2024-06-26 ENCOUNTER — Other Ambulatory Visit (HOSPITAL_BASED_OUTPATIENT_CLINIC_OR_DEPARTMENT_OTHER): Payer: Self-pay

## 2024-06-26 ENCOUNTER — Other Ambulatory Visit: Payer: Self-pay

## 2024-06-26 MED ORDER — TRULICITY 1.5 MG/0.5ML ~~LOC~~ SOAJ
1.5000 mg | SUBCUTANEOUS | 0 refills | Status: DC
  Filled 2024-06-26: qty 6, 84d supply, fill #0

## 2024-07-31 ENCOUNTER — Other Ambulatory Visit: Payer: Self-pay | Admitting: Cardiology

## 2024-07-31 ENCOUNTER — Other Ambulatory Visit (HOSPITAL_BASED_OUTPATIENT_CLINIC_OR_DEPARTMENT_OTHER): Payer: Self-pay

## 2024-08-01 ENCOUNTER — Other Ambulatory Visit: Payer: Self-pay

## 2024-08-01 ENCOUNTER — Other Ambulatory Visit (HOSPITAL_BASED_OUTPATIENT_CLINIC_OR_DEPARTMENT_OTHER): Payer: Self-pay

## 2024-08-01 MED ORDER — CARVEDILOL 6.25 MG PO TABS
6.2500 mg | ORAL_TABLET | Freq: Two times a day (BID) | ORAL | 0 refills | Status: DC
  Filled 2024-08-01: qty 60, 30d supply, fill #0

## 2024-08-04 ENCOUNTER — Other Ambulatory Visit (HOSPITAL_BASED_OUTPATIENT_CLINIC_OR_DEPARTMENT_OTHER): Payer: Self-pay

## 2024-08-07 ENCOUNTER — Other Ambulatory Visit (HOSPITAL_BASED_OUTPATIENT_CLINIC_OR_DEPARTMENT_OTHER): Payer: Self-pay

## 2024-08-14 ENCOUNTER — Other Ambulatory Visit (HOSPITAL_BASED_OUTPATIENT_CLINIC_OR_DEPARTMENT_OTHER): Payer: Self-pay

## 2024-08-14 ENCOUNTER — Other Ambulatory Visit: Payer: Self-pay

## 2024-08-14 MED ORDER — AMOXICILLIN 500 MG PO CAPS
2000.0000 mg | ORAL_CAPSULE | ORAL | 0 refills | Status: AC
  Filled 2024-08-14: qty 16, 4d supply, fill #0

## 2024-08-14 MED ORDER — MONTELUKAST SODIUM 10 MG PO TABS
10.0000 mg | ORAL_TABLET | Freq: Every day | ORAL | 5 refills | Status: AC
  Filled 2024-08-14: qty 30, 30d supply, fill #0
  Filled 2024-09-16: qty 30, 30d supply, fill #1
  Filled 2024-10-13: qty 30, 30d supply, fill #2

## 2024-08-28 ENCOUNTER — Other Ambulatory Visit: Payer: Self-pay | Admitting: Cardiology

## 2024-08-28 ENCOUNTER — Other Ambulatory Visit (HOSPITAL_BASED_OUTPATIENT_CLINIC_OR_DEPARTMENT_OTHER): Payer: Self-pay

## 2024-08-29 ENCOUNTER — Other Ambulatory Visit: Payer: Self-pay

## 2024-08-29 ENCOUNTER — Other Ambulatory Visit (HOSPITAL_BASED_OUTPATIENT_CLINIC_OR_DEPARTMENT_OTHER): Payer: Self-pay

## 2024-08-29 MED ORDER — PRASUGREL HCL 10 MG PO TABS
10.0000 mg | ORAL_TABLET | Freq: Every day | ORAL | 0 refills | Status: DC
  Filled 2024-08-29: qty 30, 30d supply, fill #0

## 2024-08-29 MED ORDER — METFORMIN HCL 850 MG PO TABS: 850.0000 mg | ORAL_TABLET | Freq: Two times a day (BID) | ORAL | 1 refills | Status: DC

## 2024-08-29 MED ORDER — CARVEDILOL 6.25 MG PO TABS
6.2500 mg | ORAL_TABLET | Freq: Two times a day (BID) | ORAL | 0 refills | Status: DC
  Filled 2024-08-29 – 2024-09-01 (×2): qty 60, 30d supply, fill #0

## 2024-08-29 MED ORDER — GLIPIZIDE ER 2.5 MG PO TB24
2.5000 mg | ORAL_TABLET | Freq: Every day | ORAL | 3 refills | Status: DC
  Filled 2024-08-29: qty 90, 90d supply, fill #0

## 2024-09-01 ENCOUNTER — Other Ambulatory Visit (HOSPITAL_BASED_OUTPATIENT_CLINIC_OR_DEPARTMENT_OTHER): Payer: Self-pay

## 2024-09-27 ENCOUNTER — Other Ambulatory Visit: Payer: Self-pay | Admitting: Physician Assistant

## 2024-09-27 ENCOUNTER — Other Ambulatory Visit (HOSPITAL_BASED_OUTPATIENT_CLINIC_OR_DEPARTMENT_OTHER): Payer: Self-pay

## 2024-09-29 ENCOUNTER — Other Ambulatory Visit (HOSPITAL_BASED_OUTPATIENT_CLINIC_OR_DEPARTMENT_OTHER): Payer: Self-pay

## 2024-09-29 MED ORDER — LOSARTAN POTASSIUM 50 MG PO TABS
50.0000 mg | ORAL_TABLET | Freq: Every day | ORAL | 0 refills | Status: DC
  Filled 2024-09-29: qty 90, 90d supply, fill #0

## 2024-09-30 ENCOUNTER — Other Ambulatory Visit: Payer: Self-pay | Admitting: Cardiology

## 2024-09-30 ENCOUNTER — Other Ambulatory Visit (HOSPITAL_BASED_OUTPATIENT_CLINIC_OR_DEPARTMENT_OTHER): Payer: Self-pay

## 2024-10-02 ENCOUNTER — Other Ambulatory Visit (HOSPITAL_BASED_OUTPATIENT_CLINIC_OR_DEPARTMENT_OTHER): Payer: Self-pay

## 2024-10-02 MED ORDER — PRASUGREL HCL 10 MG PO TABS
10.0000 mg | ORAL_TABLET | Freq: Every day | ORAL | 0 refills | Status: DC
  Filled 2024-10-02: qty 30, 30d supply, fill #0

## 2024-10-02 MED ORDER — CARVEDILOL 6.25 MG PO TABS
6.2500 mg | ORAL_TABLET | Freq: Two times a day (BID) | ORAL | 3 refills | Status: DC
  Filled 2024-10-02: qty 180, 90d supply, fill #0

## 2024-10-03 ENCOUNTER — Other Ambulatory Visit (HOSPITAL_BASED_OUTPATIENT_CLINIC_OR_DEPARTMENT_OTHER): Payer: Self-pay

## 2024-10-03 MED ORDER — TRULICITY 1.5 MG/0.5ML ~~LOC~~ SOAJ
1.5000 mg | SUBCUTANEOUS | 0 refills | Status: AC
  Filled 2024-10-03: qty 6, 84d supply, fill #0

## 2024-10-11 ENCOUNTER — Ambulatory Visit: Admitting: Cardiology

## 2024-10-11 NOTE — Progress Notes (Unsigned)
 " Cardiology Office Note:  .   Date:  10/11/2024  ID:  Sarah Stevenson, DOB 1955/11/30, MRN [id number] PCP: Macarthur Elouise SQUIBB, DO  [facility] Providers Cardiologist:  Gordy Bergamo, MD { Click to update primary MD,subspecialty MD or APP then REFRESH:1}  History of Present Illness: .   Sarah Stevenson is a 69 y.o. Caucasian female patient with history of coronary artery disease with stenting to RCA in 2016, underwent bioprosthetic aortic valve replacement and CABG x 2 with LIMA to LAD and SVG to RCA on 05/17/2018, found to have incidental lung cancer during CABG and underwent left upper lobe wedge resection, hypercholesterolemia, hypertension, diabetes mellitus with stage IIIa chronic kidney disease presents here for annual visit.     Discussed the use of AI scribe software for clinical note transcription with the patient, who gave verbal consent to proceed.  History of Present Illness   Cardiac Studies relevent.    ECHOCARDIOGRAM COMPLETE 07/02/2022  1. Left ventricular ejection fraction, by estimation, is 60 to 65%. 2.  Prosthetic aortic valve, 21 mm pericardial tissue valve gradient unchanged compared to 05/17/2018.  Mean gradient of 19 mmHg, mild aortic valve stenosis.  CARDIAC CATHETERIZATION 04/01/2018 Prox LAD to Mid LAD lesion is 75% stenosed. Mid RCA 50%. Severe Aortic stenosis.  Aortic valve replacement (21 mm Magna Ease pericardial valve) and CABG x 2 (LIMA to LAD, SVG to RCA) 2019   EKG:      Labs   Lab Results  Component Value Date   CHOL 115 08/23/2023   HDL 30 (L) 08/23/2023   LDLCALC 56 08/23/2023   TRIG 171 (H) 08/23/2023   CHOLHDL 3.8 08/23/2023   No results found for: LIPOA  No results for input(s): NA, K, CL, CO2, GLUCOSE, BUN, CREATININE, CALCIUM , GFRNONAA, GFRAA in the last 8760 hours.  Lab Results  Component Value Date   ALT 12 08/09/2023   AST 13 (L) 08/09/2023   ALKPHOS 85 08/09/2023   BILITOT 0.7 08/09/2023      Latest  Ref Rng & Units 08/09/2023   10:03 AM 04/15/2023    2:27 AM 04/14/2023   10:20 AM  CBC  WBC 4.0 - 10.5 K/uL 8.5  9.1  12.9   Hemoglobin 12.0 - 15.0 g/dL 84.1  87.5  87.1   Hematocrit 36.0 - 46.0 % 46.9  38.2  37.7   Platelets 150 - 400 K/uL 223  177  205    Lab Results  Component Value Date   HGBA1C 5.9 (H) 04/15/2023    No results found for: TSH  Care everywhere/Faxed External Labs:  ***  ROS  ***ROS Physical Exam:   VS:  There were no vitals taken for this visit.   Wt Readings from Last 3 Encounters:  11/10/23 170 lb (77.1 kg)  08/20/23 177 lb (80.3 kg)  08/11/23 175 lb (79.4 kg)    BP Readings from Last 3 Encounters:  05/17/24 123/76  11/10/23 (!) 148/84  08/20/23 124/70   ***Physical Exam  ASSESSMENT AND PLAN: .      ICD-10-CM   1. Coronary artery disease involving native coronary artery of native heart without angina pectoris  I25.10     2. H/O aortic valve replacement (21 mm Magna Ease pericardial valve) and CABG x 2 (LIMA to LAD, SVG to RCA) 2019  Z95.2     3. Primary hypertension  I10     4. Pure hypercholesterolemia  E78.00     5. Statin myopathy  G72.0  U53.3K4J      Assessment and Plan Assessment & Plan    Follow up: ***  Signed,  Gordy Bergamo, MD, [facility] 10/11/2024, 8:15 AM [facility] [address] [place], [place] [address] Phone: [phone]. Fax:  [phone]  "

## 2024-10-13 ENCOUNTER — Encounter: Payer: Self-pay | Admitting: Cardiology

## 2024-10-13 ENCOUNTER — Other Ambulatory Visit (HOSPITAL_BASED_OUTPATIENT_CLINIC_OR_DEPARTMENT_OTHER): Payer: Self-pay

## 2024-10-13 ENCOUNTER — Ambulatory Visit: Admitting: Cardiology

## 2024-10-13 ENCOUNTER — Other Ambulatory Visit: Payer: Self-pay

## 2024-10-13 VITALS — BP 122/68 | HR 73 | Ht 61.0 in | Wt 175.0 lb

## 2024-10-13 DIAGNOSIS — E7800 Pure hypercholesterolemia, unspecified: Secondary | ICD-10-CM

## 2024-10-13 DIAGNOSIS — I2510 Atherosclerotic heart disease of native coronary artery without angina pectoris: Secondary | ICD-10-CM

## 2024-10-13 DIAGNOSIS — Z952 Presence of prosthetic heart valve: Secondary | ICD-10-CM

## 2024-10-13 DIAGNOSIS — I10 Essential (primary) hypertension: Secondary | ICD-10-CM

## 2024-10-13 DIAGNOSIS — T466X5A Adverse effect of antihyperlipidemic and antiarteriosclerotic drugs, initial encounter: Secondary | ICD-10-CM

## 2024-10-13 MED ORDER — PRASUGREL HCL 10 MG PO TABS
10.0000 mg | ORAL_TABLET | Freq: Every day | ORAL | 3 refills | Status: AC
  Filled 2024-10-13: qty 90, 90d supply, fill #0

## 2024-10-13 MED ORDER — CARVEDILOL 6.25 MG PO TABS
6.2500 mg | ORAL_TABLET | Freq: Two times a day (BID) | ORAL | 3 refills | Status: AC
  Filled 2024-10-13: qty 180, 90d supply, fill #0

## 2024-10-13 MED ORDER — LOSARTAN POTASSIUM 50 MG PO TABS
50.0000 mg | ORAL_TABLET | Freq: Every day | ORAL | 3 refills | Status: AC
  Filled 2024-10-13: qty 90, 90d supply, fill #0

## 2024-10-13 MED ORDER — REPATHA SURECLICK 140 MG/ML ~~LOC~~ SOAJ
140.0000 mg | SUBCUTANEOUS | 1 refills | Status: AC
  Filled 2024-10-13: qty 6, 84d supply, fill #0

## 2024-10-13 NOTE — Progress Notes (Unsigned)
 " Cardiology Office Note:  .   Date:  10/13/2024  ID:  Sarah Stevenson, DOB 1955-10-10, MRN [id number] PCP: Macarthur Elouise SQUIBB, DO  [facility] Providers Cardiologist:  Gordy Bergamo, MD { Click to update primary MD,subspecialty MD or APP then REFRESH:1}  History of Present Illness: .   Sarah Stevenson is a 69 y.o. Caucasian female patient with history of coronary artery disease with stenting to RCA in 2016, underwent bioprosthetic aortic valve replacement and CABG x 2 with LIMA to LAD and SVG to RCA on 05/17/2018, found to have incidental lung cancer during CABG and underwent left upper lobe wedge resection during CABG, hypercholesterolemia, hypertension, diabetes mellitus presents here for annual visit.  Remains asymptomatic. Remains active.     Discussed the use of AI scribe software for clinical note transcription with the patient, who gave verbal consent to proceed.  History of Present Illness   Cardiac Studies relevent.    ECHOCARDIOGRAM COMPLETE 07/02/2022  1. Left ventricular ejection fraction, by estimation, is 60 to 65%. 2.  Prosthetic aortic valve, 21 mm pericardial tissue valve gradient unchanged compared to 05/17/2018.  Mean gradient of 19 mmHg, mild aortic valve stenosis.  CARDIAC CATHETERIZATION 04/01/2018 Prox LAD to Mid LAD lesion is 75% stenosed. Mid RCA 50%. Severe Aortic stenosis.  Aortic valve replacement (21 mm Magna Ease pericardial valve) and CABG x 2 (LIMA to LAD, SVG to RCA) 2019   EKG:   EKG Interpretation Date/Time:  Friday October 13 2024 13:03:29 EST Ventricular Rate:  73 PR Interval:  132 QRS Duration:  106 QT Interval:  392 QTC Calculation: 431 R Axis:   -33  Text Interpretation: EKG 10/13/2024: Normal sinus rhythm at rate of 73 bpm, left anterior fascicular block.  No evidence of ischemia.  Compared to 04/14/2023, no change. Confirmed by Bergamo Gordy [phone]) on 10/13/2024 1:15:04 PM  Labs    Care everywhere/Faxed External Labs:  Labs  03/31/2024:  BUN 14, creatinine 0.90, eGFR 70 mL, potassium 4.9.  A1c 5.4%.  Total cholesterol 114, triglycerides 136, HDL 34, LDL 58.  Labs 10/05/2023:  Hb 15.1/HCT 44.7, platelets 191.  ROS  ***Review of Systems  Cardiovascular:  Negative for chest pain, dyspnea on exertion and leg swelling.   Physical Exam:   VS:  BP 122/68 (BP Location: Right Arm, Patient Position: Sitting, Cuff Size: Normal)   Pulse 73   Ht 5' 1 (1.549 m)   Wt 175 lb (79.4 kg)   SpO2 97%   BMI 33.07 kg/m    Wt Readings from Last 3 Encounters:  10/13/24 175 lb (79.4 kg)  11/10/23 170 lb (77.1 kg)  08/20/23 177 lb (80.3 kg)    BP Readings from Last 3 Encounters:  10/13/24 122/68  05/17/24 123/76  11/10/23 (!) 148/84   ***Physical Exam Neck:     Vascular: No JVD.  Cardiovascular:     Rate and Rhythm: Normal rate and regular rhythm.     Pulses: Intact distal pulses.          Carotid pulses are  on the right side with bruit and  on the left side with bruit.    Heart sounds: S1 normal and S2 normal. Murmur heard.     Early systolic murmur is present with a grade of 3/6 at the upper right sternal border radiating to the neck.     No gallop.  Pulmonary:     Effort: Pulmonary effort is normal.     Breath sounds: Normal breath sounds.  Abdominal:     General: Bowel sounds are normal.     Palpations: Abdomen is soft.  Musculoskeletal:     Right lower leg: No edema.     Left lower leg: No edema.     ASSESSMENT AND PLAN: .      ICD-10-CM   1. Coronary artery disease involving native coronary artery of native heart without angina pectoris  I25.10 EKG 12-Lead    Evolocumab  (REPATHA  SURECLICK) 140 MG/ML SOAJ    losartan  (COZAAR ) 50 MG tablet    prasugrel  (EFFIENT ) 10 MG TABS tablet    2. H/O aortic valve replacement (21 mm Magna Ease pericardial valve) and CABG x 2 (LIMA to LAD, SVG to RCA) 2019  Z95.2 EKG 12-Lead    3. Primary hypertension  I10 EKG 12-Lead    losartan  (COZAAR ) 50 MG tablet     4. Pure hypercholesterolemia  E78.00 EKG 12-Lead    Evolocumab  (REPATHA  SURECLICK) 140 MG/ML SOAJ    5. Statin myopathy  G72.0 EKG 12-Lead   T46.6X5A      Assessment & Plan   Follow up: 1 Year CAD, CABG, AV replacement  Signed,  Gordy Bergamo, MD, [facility] 10/13/2024, 1:36 PM [facility] [address] [place], [place] [address] Phone: [phone]. Fax:  [phone]  "

## 2024-10-13 NOTE — Patient Instructions (Signed)
 We recommend signing up for the patient portal called MyChart.  Patients are able to view lab/test results, encounter notes, upcoming appointments, etc.  Non-urgent messages can be sent to your provider as well, go to [email or web address].

## 2024-11-01 ENCOUNTER — Ambulatory Visit (INDEPENDENT_AMBULATORY_CARE_PROVIDER_SITE_OTHER): Admitting: Otolaryngology
